# Patient Record
Sex: Male | Born: 1955 | Race: Black or African American | Hispanic: No | Smoking: Never smoker
Health system: Southern US, Community
[De-identification: ages and names within clinical notes are randomized; demographics above are authoritative.]

## PROBLEM LIST (undated history)

## (undated) DIAGNOSIS — N182 Chronic kidney disease, stage 2 (mild): Secondary | ICD-10-CM

## (undated) DIAGNOSIS — E039 Hypothyroidism, unspecified: Secondary | ICD-10-CM

## (undated) DIAGNOSIS — I509 Heart failure, unspecified: Secondary | ICD-10-CM

## (undated) DIAGNOSIS — E785 Hyperlipidemia, unspecified: Secondary | ICD-10-CM

## (undated) DIAGNOSIS — I6381 Other cerebral infarction due to occlusion or stenosis of small artery: Secondary | ICD-10-CM

## (undated) DIAGNOSIS — F32A Depression, unspecified: Secondary | ICD-10-CM

## (undated) DIAGNOSIS — I2510 Atherosclerotic heart disease of native coronary artery without angina pectoris: Secondary | ICD-10-CM

## (undated) DIAGNOSIS — E079 Disorder of thyroid, unspecified: Secondary | ICD-10-CM

## (undated) DIAGNOSIS — I5189 Other ill-defined heart diseases: Secondary | ICD-10-CM

## (undated) DIAGNOSIS — F1490 Cocaine use, unspecified, uncomplicated: Secondary | ICD-10-CM

## (undated) DIAGNOSIS — I10 Essential (primary) hypertension: Secondary | ICD-10-CM

## (undated) HISTORY — DX: Depression, unspecified: F32.A

## (undated) HISTORY — DX: Atherosclerotic heart disease of native coronary artery without angina pectoris: I25.10

## (undated) HISTORY — DX: Essential (primary) hypertension: I10

## (undated) HISTORY — DX: Hyperlipidemia, unspecified: E78.5

## (undated) HISTORY — DX: Heart failure, unspecified: I50.9

## (undated) HISTORY — PX: HAND SURGERY: SHX662

---

## 2006-05-10 ENCOUNTER — Inpatient Hospital Stay: Payer: Self-pay | Admitting: Specialist

## 2006-05-10 IMAGING — CR DG HAND COMPLETE 3+V*L*
1 series · 3 of 3 positions shown · non-contrast
Comparison: none

REASON FOR EXAM: SWELLING
COMMENTS:

[Series 1: view not recorded · 0.17mm/px · 3 of 3 slices shown]
[im 1/3]
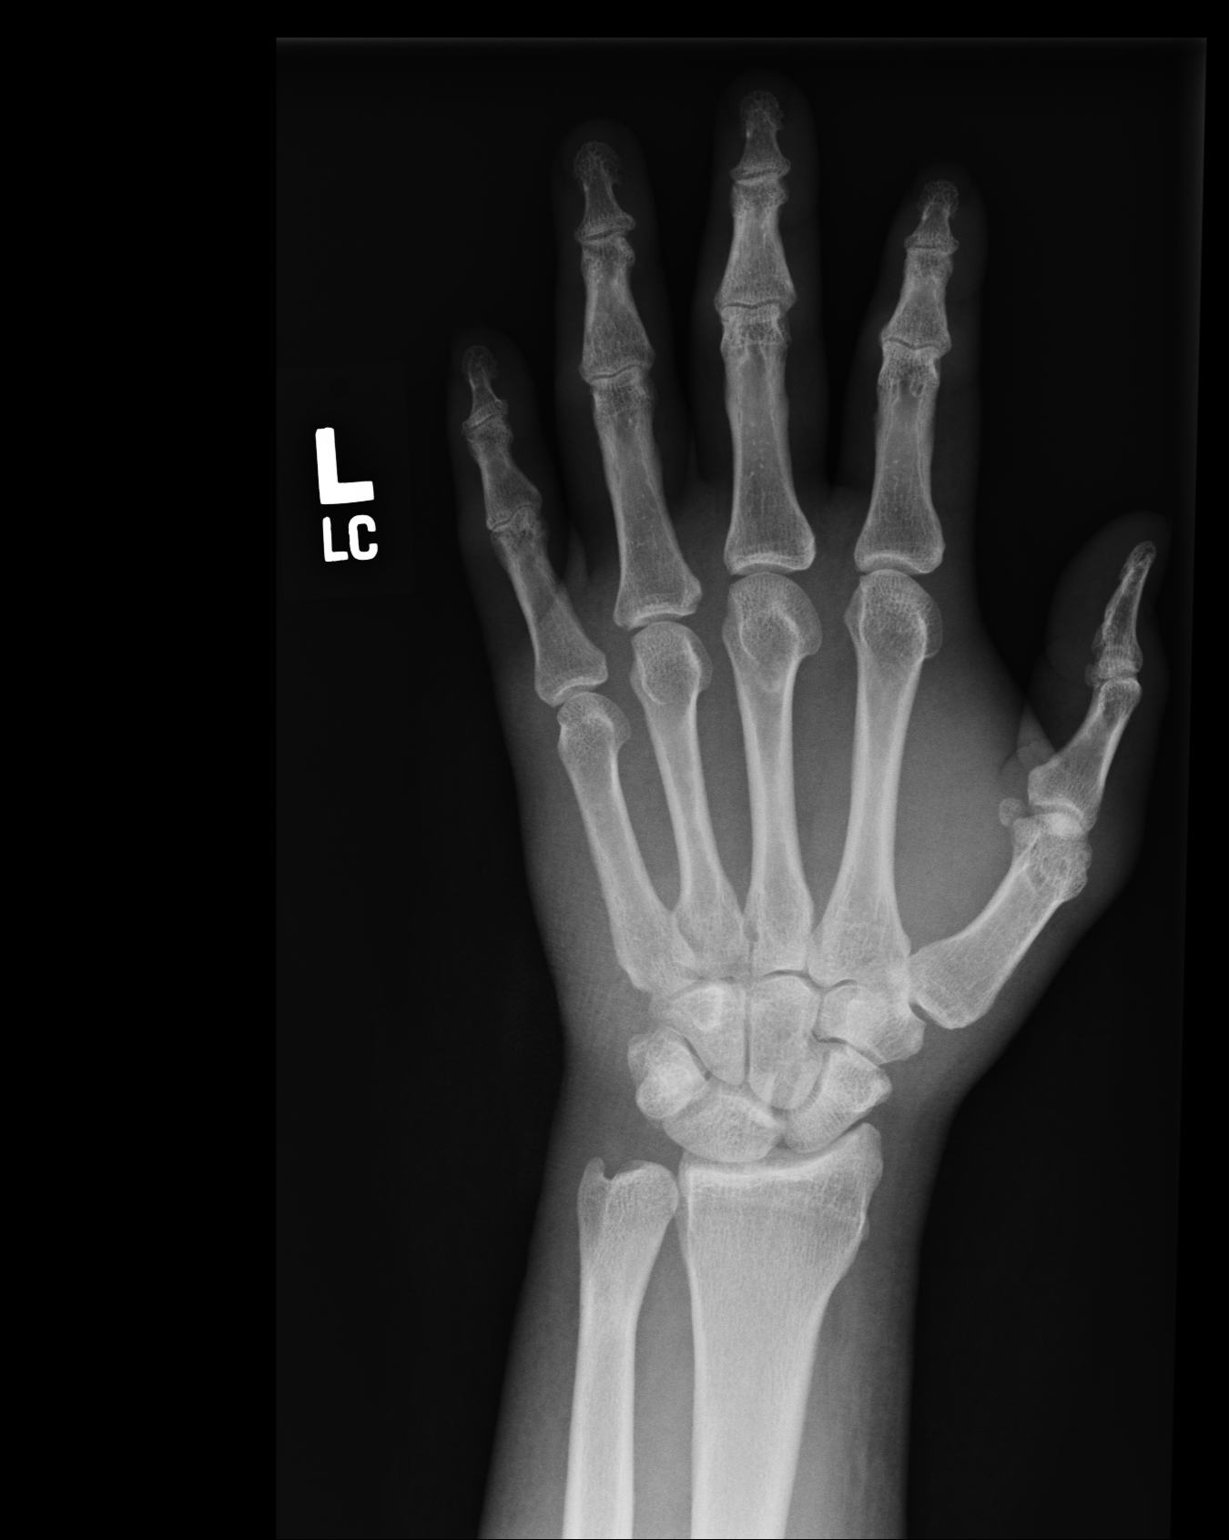
[im 2/3]
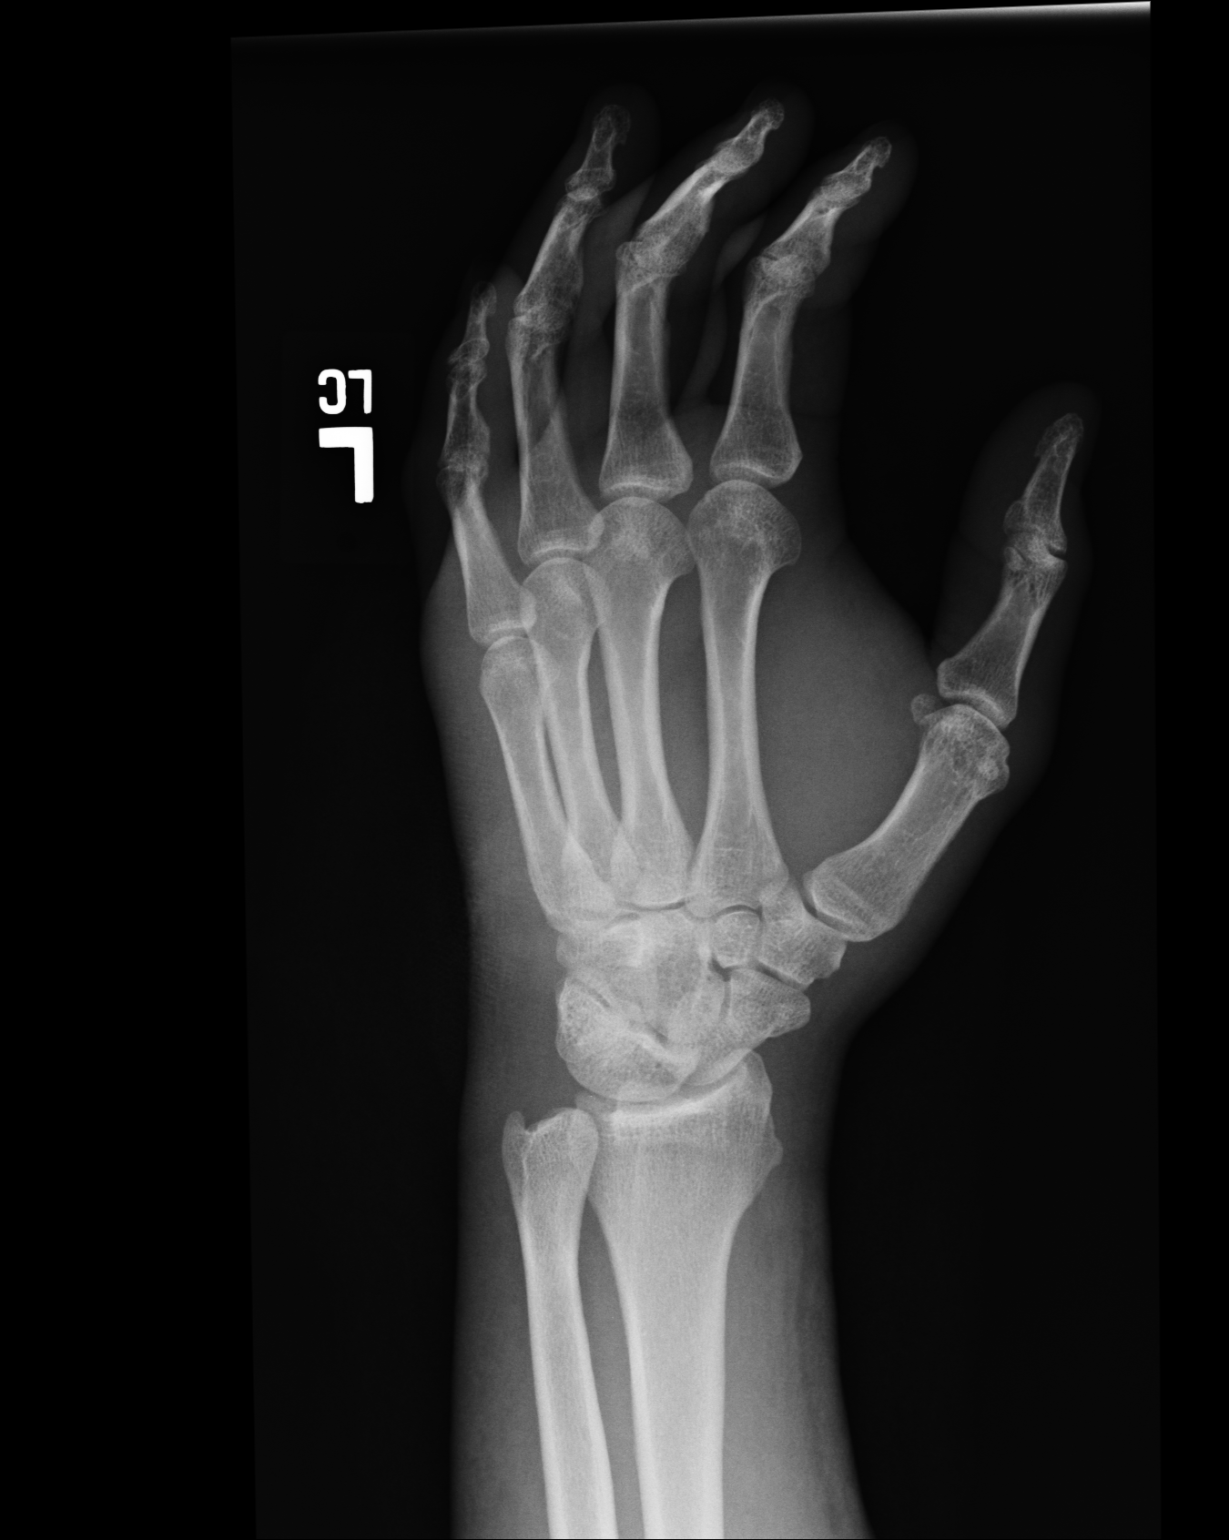
[im 3/3]
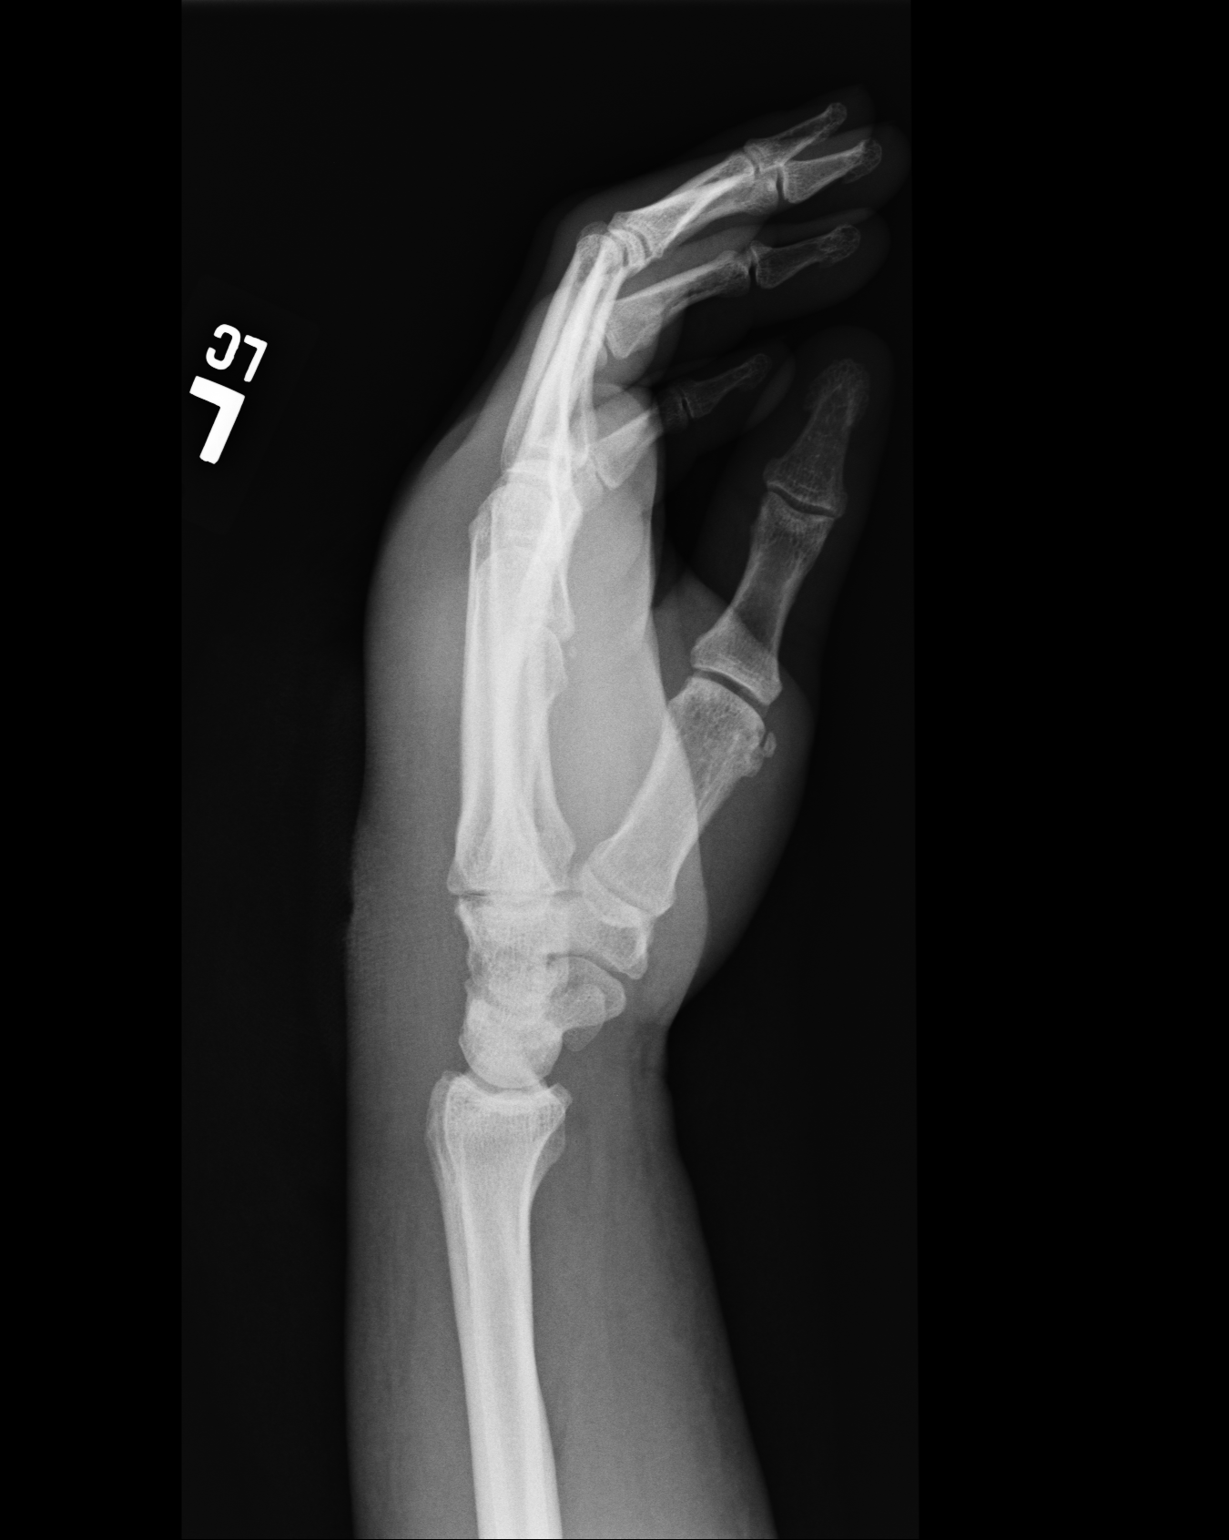

[3 of 3 positions shown; findings below may reference images not displayed]

PROCEDURE:     DXR - DXR HAND LT COMPLETE  W/OBLIQUES  - [DATE]  [DATE]

RESULT:       Three views of the LEFT hand show no fracture or dislocation.
No findings suspicious for osteomyelitis are identified.  No foreign body is
seen.  There is slight arthritic change at the DIP joints and at the IP
joint of the thumb.
IMPRESSION: 1.     No acute changes are identified.
2.     There is mild arthritic change at the IP joint of the thumb and at
the DIP joints of the second through the fifth fingers.

## 2006-05-11 ENCOUNTER — Other Ambulatory Visit: Payer: Self-pay

## 2006-05-11 IMAGING — CR DG CHEST 2V
1 series · 2 of 2 positions shown · non-contrast
Comparison: none

REASON FOR EXAM: hyponatremia
COMMENTS:

PROCEDURE:     DXR - DXR CHEST PA (OR AP) AND LATERAL  - [DATE]  [DATE]
RESULT:     The lung fields are clear. The heart, mediastinal and osseous
structures show no significant abnormalities.

[Series 1: view not recorded · 0.17mm/px · 2 of 2 slices shown]
[im 1/2]
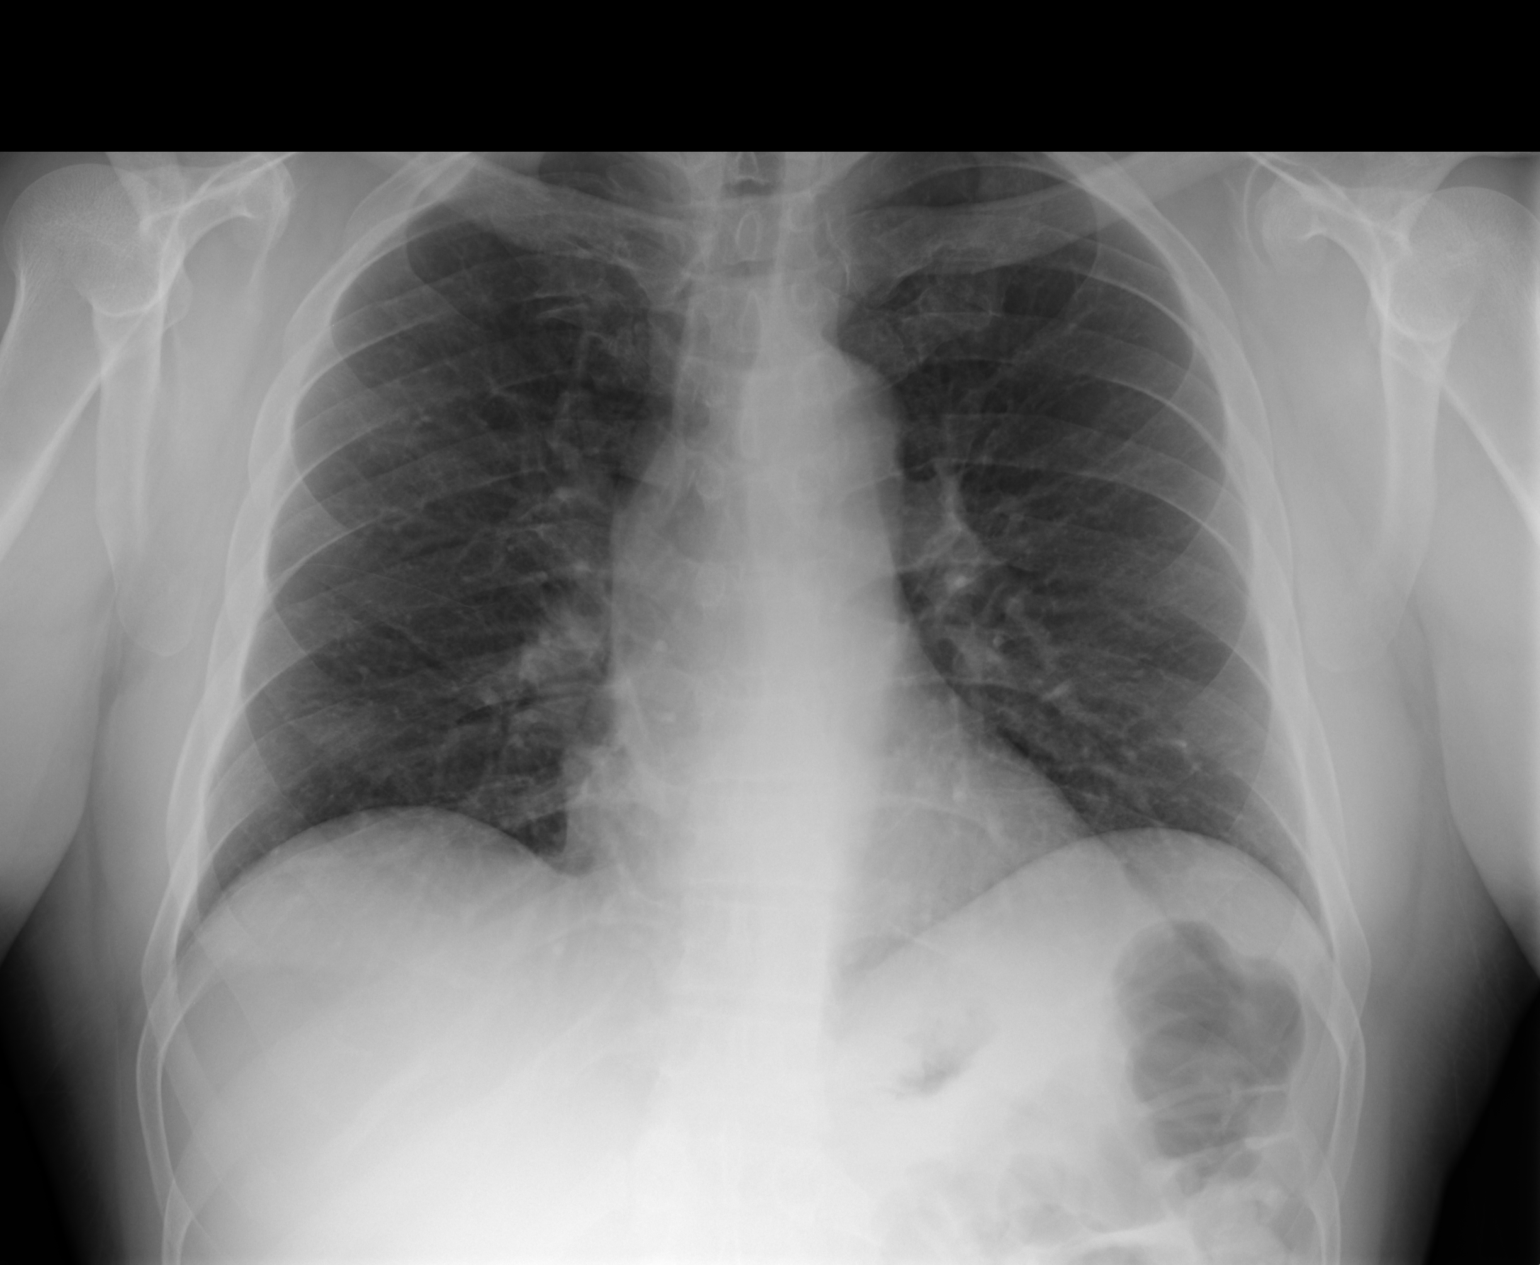
[im 2/2]
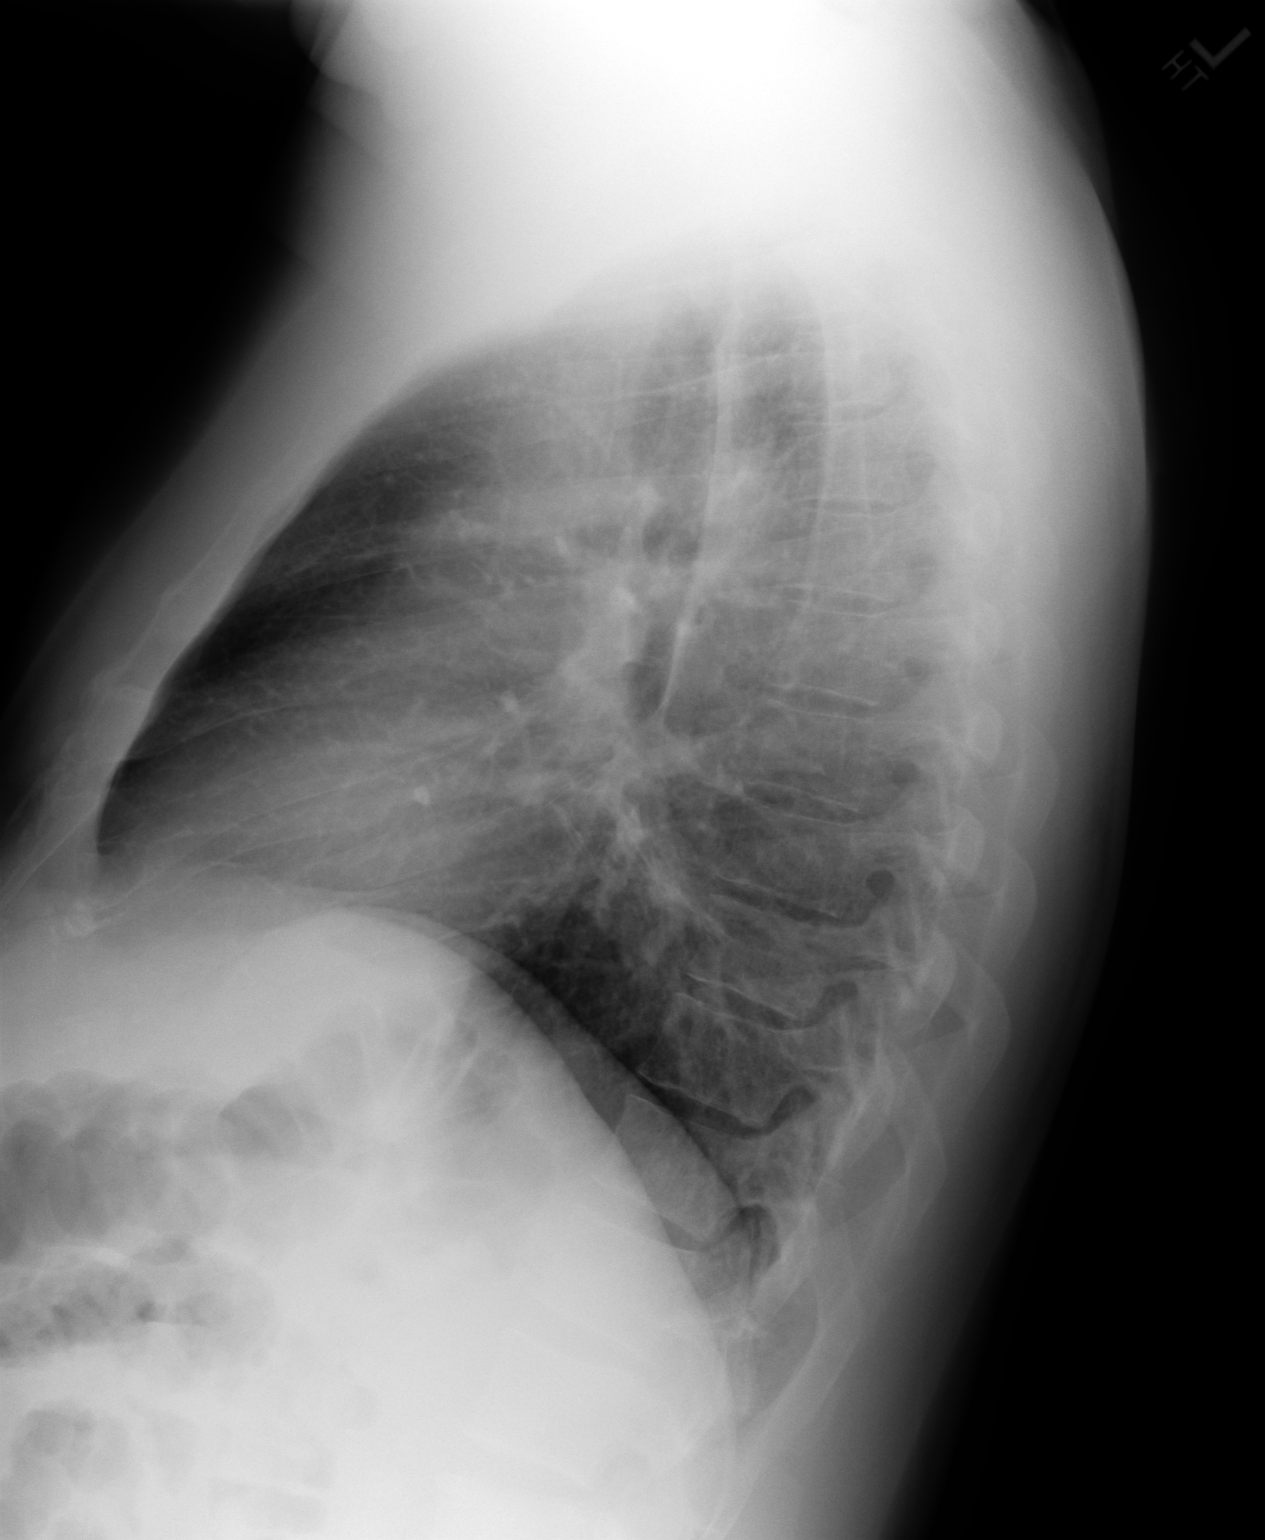

[2 of 2 positions shown; findings below may reference images not displayed]

IMPRESSION: 1)No acute changes are identified.

## 2008-06-04 ENCOUNTER — Emergency Department: Payer: Self-pay | Admitting: Emergency Medicine

## 2008-06-04 IMAGING — CR DG HAND 2V*L*
1 series · 2 of 2 positions shown · non-contrast
Comparison: none

REASON FOR EXAM: pain
COMMENTS:

PROCEDURE:     DXR - DXR HAND LT TWO VIEWS  - [DATE]  [DATE]
RESULT:     No fracture, dislocation or other acute bony abnormality is
identified.

[Series 1: view not recorded · 0.17mm/px · 2 of 2 slices shown]
[im 1/2]
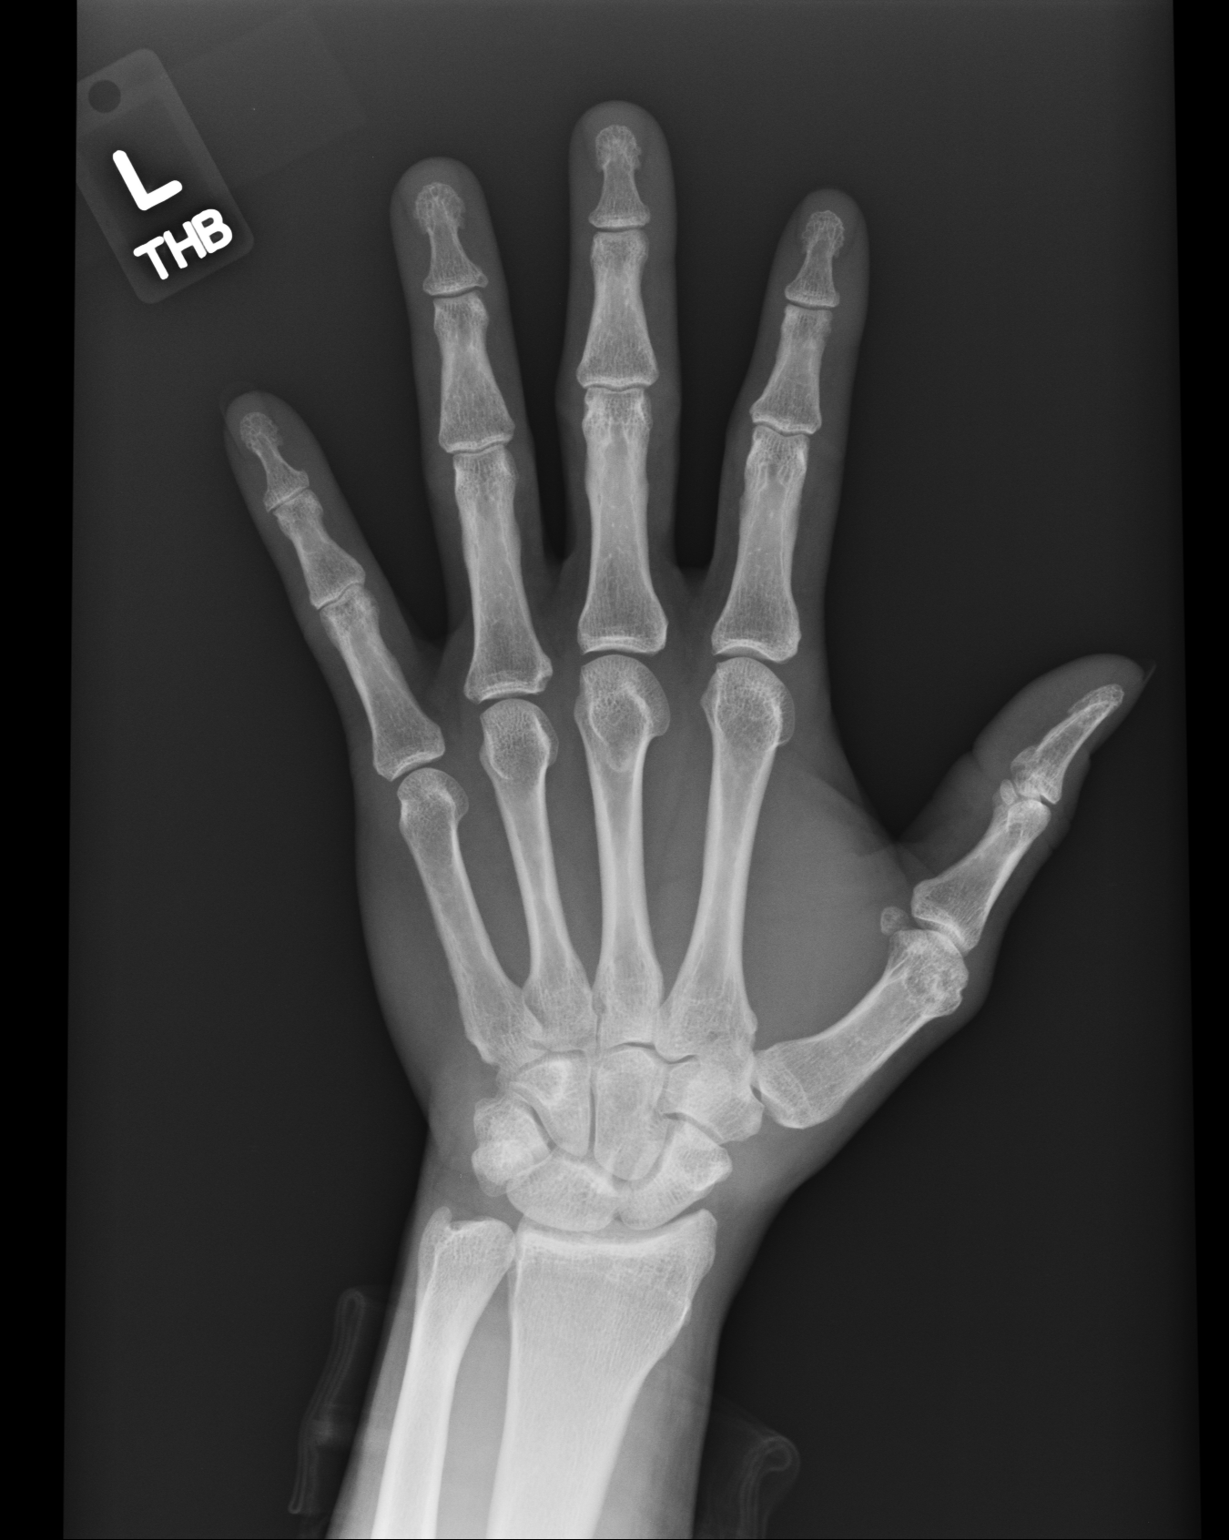
[im 2/2]
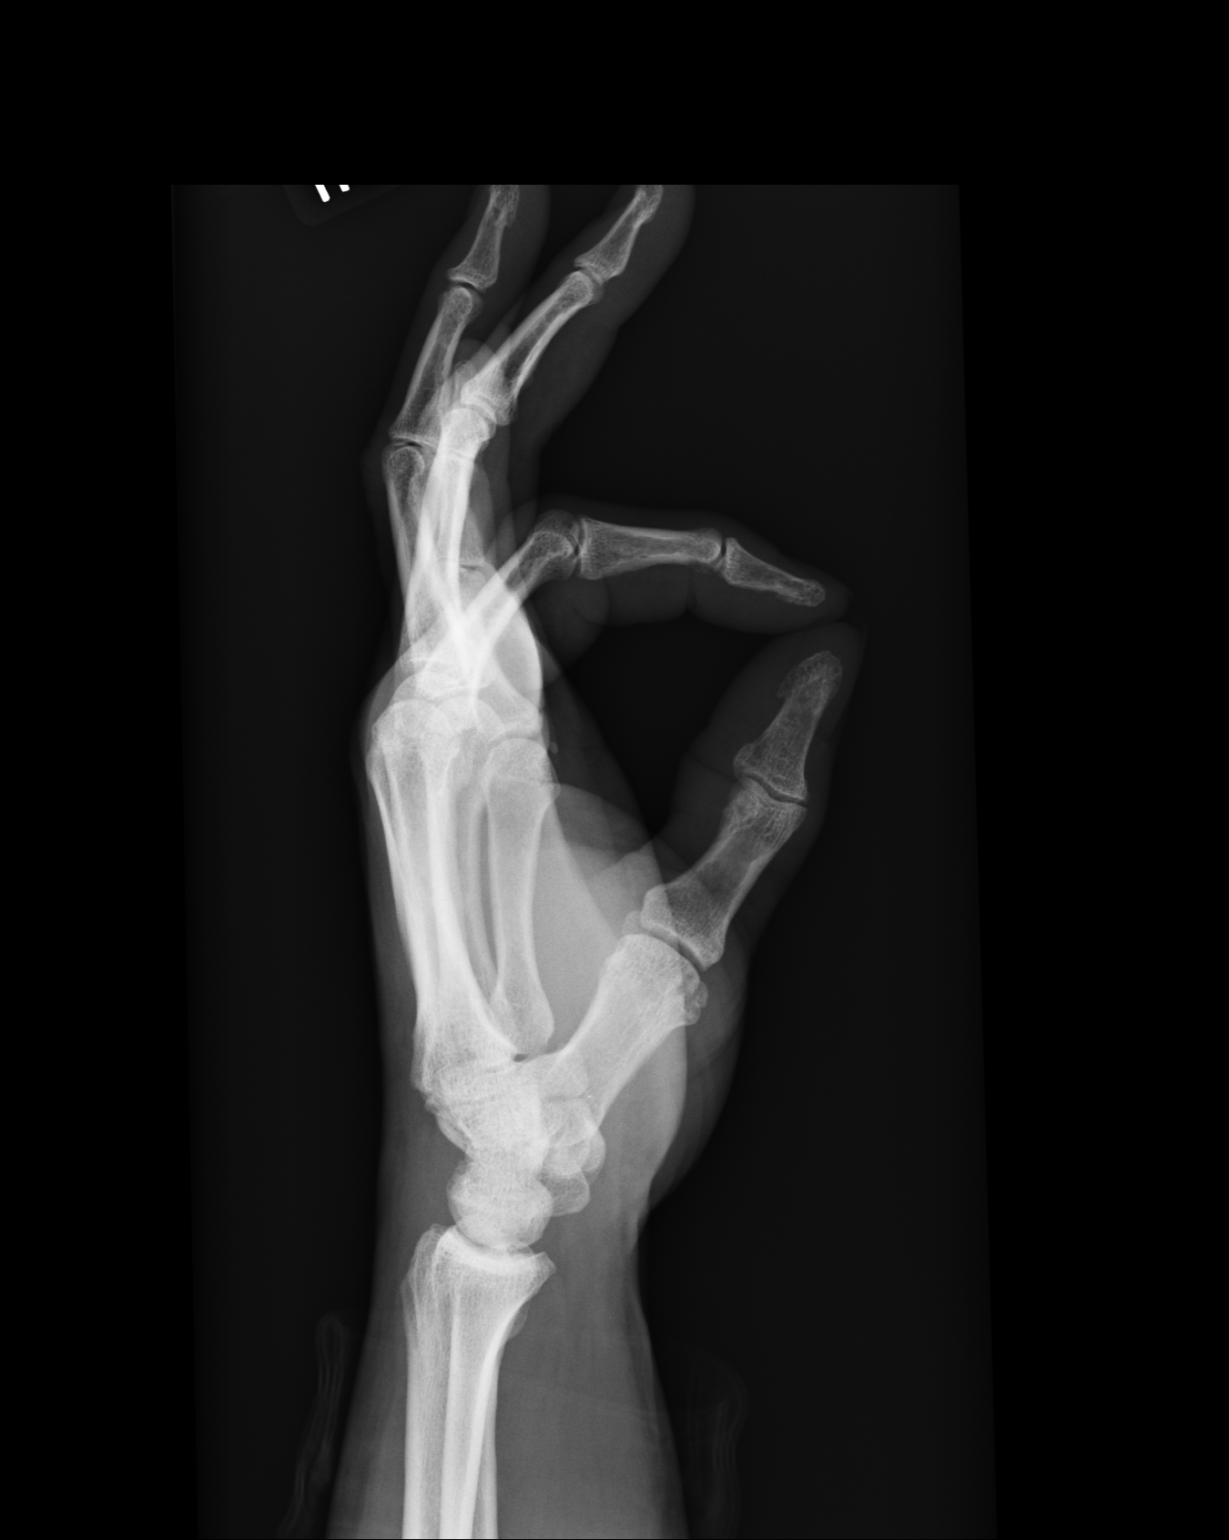

[2 of 2 positions shown; findings below may reference images not displayed]

IMPRESSION: No significant osseous abnormalities are noted.

## 2008-11-18 ENCOUNTER — Ambulatory Visit: Payer: Self-pay | Admitting: Cardiovascular Disease

## 2008-11-18 ENCOUNTER — Observation Stay (HOSPITAL_COMMUNITY): Admission: EM | Admit: 2008-11-18 | Discharge: 2008-11-18 | Payer: Self-pay | Admitting: Emergency Medicine

## 2008-11-18 ENCOUNTER — Encounter (INDEPENDENT_AMBULATORY_CARE_PROVIDER_SITE_OTHER): Payer: Self-pay | Admitting: Emergency Medicine

## 2008-11-18 IMAGING — CR DG CHEST 1V PORT
1 series · 1 of 1 positions shown · non-contrast
Comparison: None

CLINICAL DATA: Chest pain and left arm tingling

PORTABLE CHEST - 1 VIEW

[view not recorded]
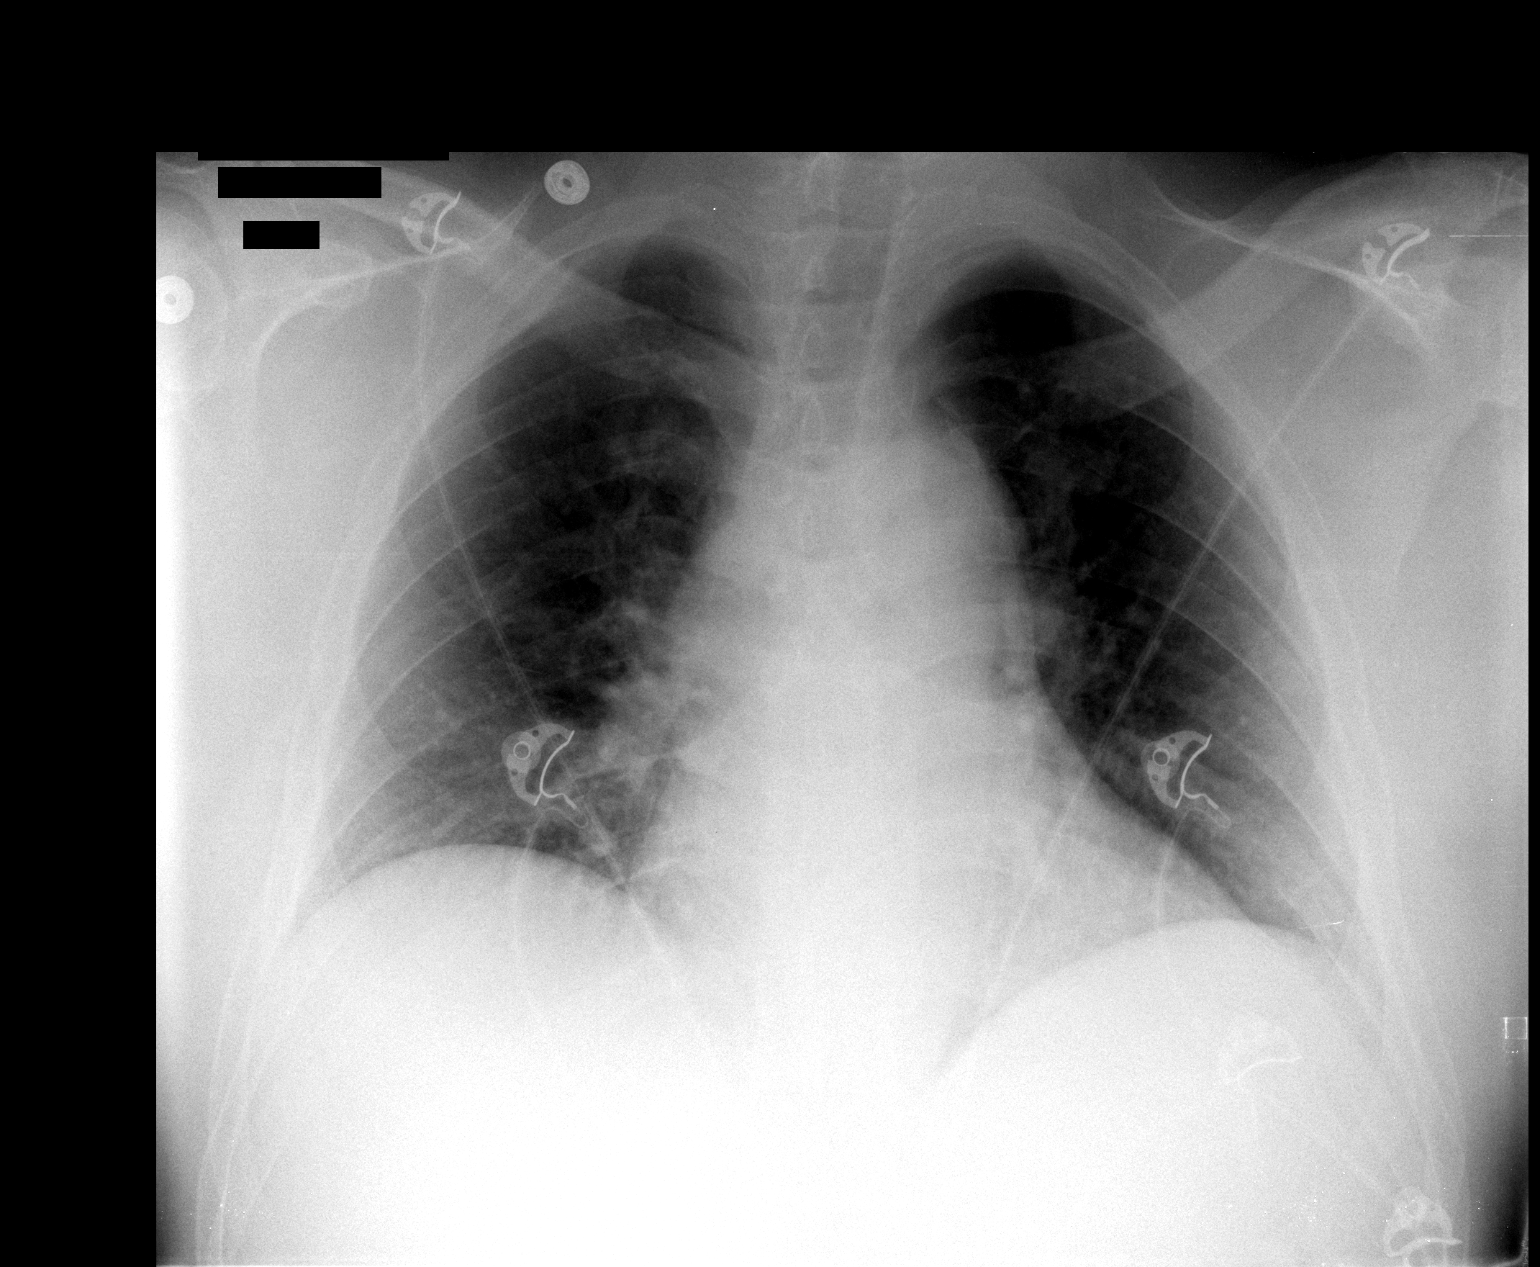

[1 of 1 positions shown; findings below may reference images not displayed]

FINDINGS: The cardiac silhouette, mediastinum, pulmonary
vasculature are within normal limits.  Both lungs are clear.
IMPRESSION: Unremarkable portable chest x-ray.

## 2009-02-14 ENCOUNTER — Emergency Department (HOSPITAL_COMMUNITY): Admission: EM | Admit: 2009-02-14 | Discharge: 2009-02-16 | Payer: Self-pay | Admitting: Emergency Medicine

## 2009-02-16 ENCOUNTER — Ambulatory Visit: Payer: Self-pay | Admitting: Psychiatry

## 2009-02-16 ENCOUNTER — Inpatient Hospital Stay (HOSPITAL_COMMUNITY): Admission: AD | Admit: 2009-02-16 | Discharge: 2009-02-20 | Payer: Self-pay | Admitting: Psychiatry

## 2009-02-19 ENCOUNTER — Encounter (INDEPENDENT_AMBULATORY_CARE_PROVIDER_SITE_OTHER): Payer: Self-pay | Admitting: Family Medicine

## 2009-03-02 ENCOUNTER — Emergency Department (HOSPITAL_COMMUNITY): Admission: EM | Admit: 2009-03-02 | Discharge: 2009-03-02 | Payer: Self-pay | Admitting: Emergency Medicine
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
## 2009-03-19 ENCOUNTER — Inpatient Hospital Stay (HOSPITAL_COMMUNITY): Admission: AD | Admit: 2009-03-19 | Discharge: 2009-03-25 | Payer: Self-pay | Admitting: Psychiatry

## 2009-03-19 ENCOUNTER — Ambulatory Visit: Payer: Self-pay | Admitting: Psychiatry

## 2009-03-19 ENCOUNTER — Emergency Department (HOSPITAL_COMMUNITY): Admission: EM | Admit: 2009-03-19 | Discharge: 2009-03-19 | Payer: Self-pay | Admitting: Emergency Medicine
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
## 2010-04-20 NOTE — Letter (Signed)
[facility]   Imported By: Arta Bruce 04/15/2009 15:07:06  _____________________________________________________________________  External Attachment:    Type:   Image     Comment:   External Document

## 2010-06-06 LAB — GLUCOSE, CAPILLARY
Glucose-Capillary: 176 mg/dL — ABNORMAL HIGH (ref 70–99)
Glucose-Capillary: 187 mg/dL — ABNORMAL HIGH (ref 70–99)
Glucose-Capillary: 191 mg/dL — ABNORMAL HIGH (ref 70–99)
Glucose-Capillary: 196 mg/dL — ABNORMAL HIGH (ref 70–99)
Glucose-Capillary: 200 mg/dL — ABNORMAL HIGH (ref 70–99)
Glucose-Capillary: 204 mg/dL — ABNORMAL HIGH (ref 70–99)
Glucose-Capillary: 240 mg/dL — ABNORMAL HIGH (ref 70–99)
Glucose-Capillary: 275 mg/dL — ABNORMAL HIGH (ref 70–99)
Glucose-Capillary: 297 mg/dL — ABNORMAL HIGH (ref 70–99)

## 2010-06-08 ENCOUNTER — Observation Stay: Payer: Self-pay | Admitting: Internal Medicine

## 2010-06-08 DIAGNOSIS — R079 Chest pain, unspecified: Secondary | ICD-10-CM

## 2010-06-08 IMAGING — NM NM MYOCARD GATED
1 of 8 series · 3 of 34 positions shown, 4 images · non-contrast
Comparison: none

REASON FOR EXAM: unstable angina
COMMENTS:

[Series 2: ac rest cardiac 5.0 b08s · axial · 0.98mm/px · z∈[+1559,+1714]mm · 3 of 32 slices shown, 4 images]
[im 1/32  soft-tissue]
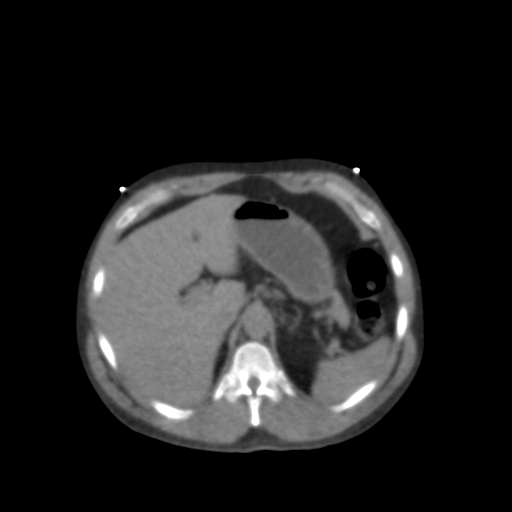
[im 1/32  bone]
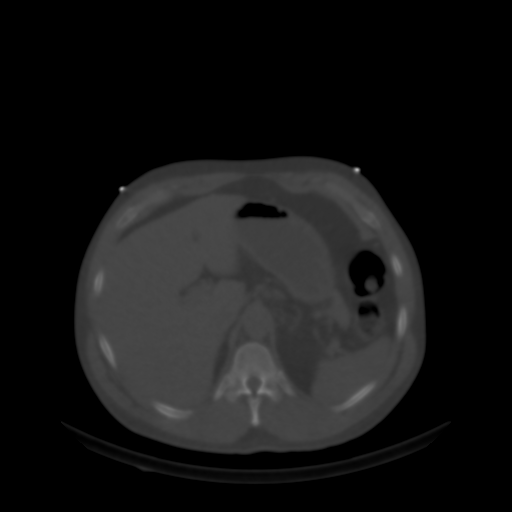
[im 16/32  soft-tissue]
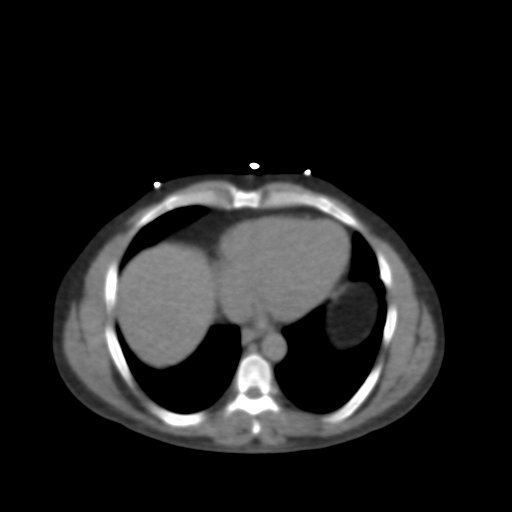
[im 32/32  soft-tissue]
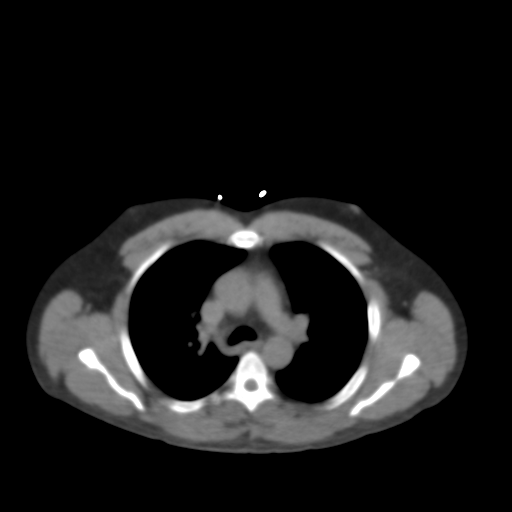

[3 of 34 positions shown; findings below may reference images not displayed]

PROCEDURE:     NM  - NM GATED MYOCARDIAL ST SCAN [DATE]OF[DATE]  - [DATE] [DATE] [DATE]  [DATE]

RESULT:

ORDERING PHYSICIAN:  Dr. DREY

INTERPRETING PHYSICIAN: Dr. DREY

ID: Mr. DREY is a pleasant gentleman who presented through the Emergency
Room with chest pain.  He also had shortness of breath and history of
syncope. No known coronary artery disease.

Resting EKG shows normal sinus rhythm with rate 61 beats per minute with no
significant ST or T-wave changes. Resting blood pressure was 161/104. He
exercised on a regular Bruce protocol for a total time of 9 minutes and 55
seconds, maximum heart rate 155 beats per minute which was 94% of his
maximum predicted heart rate of 164 beats per minute, maximum blood pressure
221/92. He achieved 11.7 METS. No significant EKG changes were noted at peak
exercise or in recovery.

Technetium 99m Sestamibi dosing at rest was 13.72 mCi. Stress dose was
mCi.

Perfusion imaging shows normal perfusion throughout the myocardium both at
stress and rest. Ejection fraction estimated at 58%. End-diastolic volume 88
mls, end-systolic volume 37 mls. No focal wall motion abnormalities noted.
IMPRESSION: 1.Exercise myocardial perfusion imaging study with no significant ischemia
noted. Normal perfusion throughout the myocardium at stress and rest. Normal
wall motion. No significant EKG changes noted. Ejection fraction 58%.
Overall, this is a low-risk scan.

## 2010-06-08 IMAGING — CR DG CHEST 2V
1 series · 2 of 2 positions shown · non-contrast
Comparison: none

REASON FOR EXAM: cp
COMMENTS:

PROCEDURE:     DXR - DXR CHEST PA (OR AP) AND LATERAL  - [DATE]  [DATE]
RESULT:     The lung fields are clear. No pneumonia, pneumothorax or pleural
effusion is seen. The heart size is normal. The mediastinal and osseous
structures show no significant abnormalities.

[Series 1: view not recorded · 0.17mm/px · 2 of 2 slices shown]
[im 1/2]
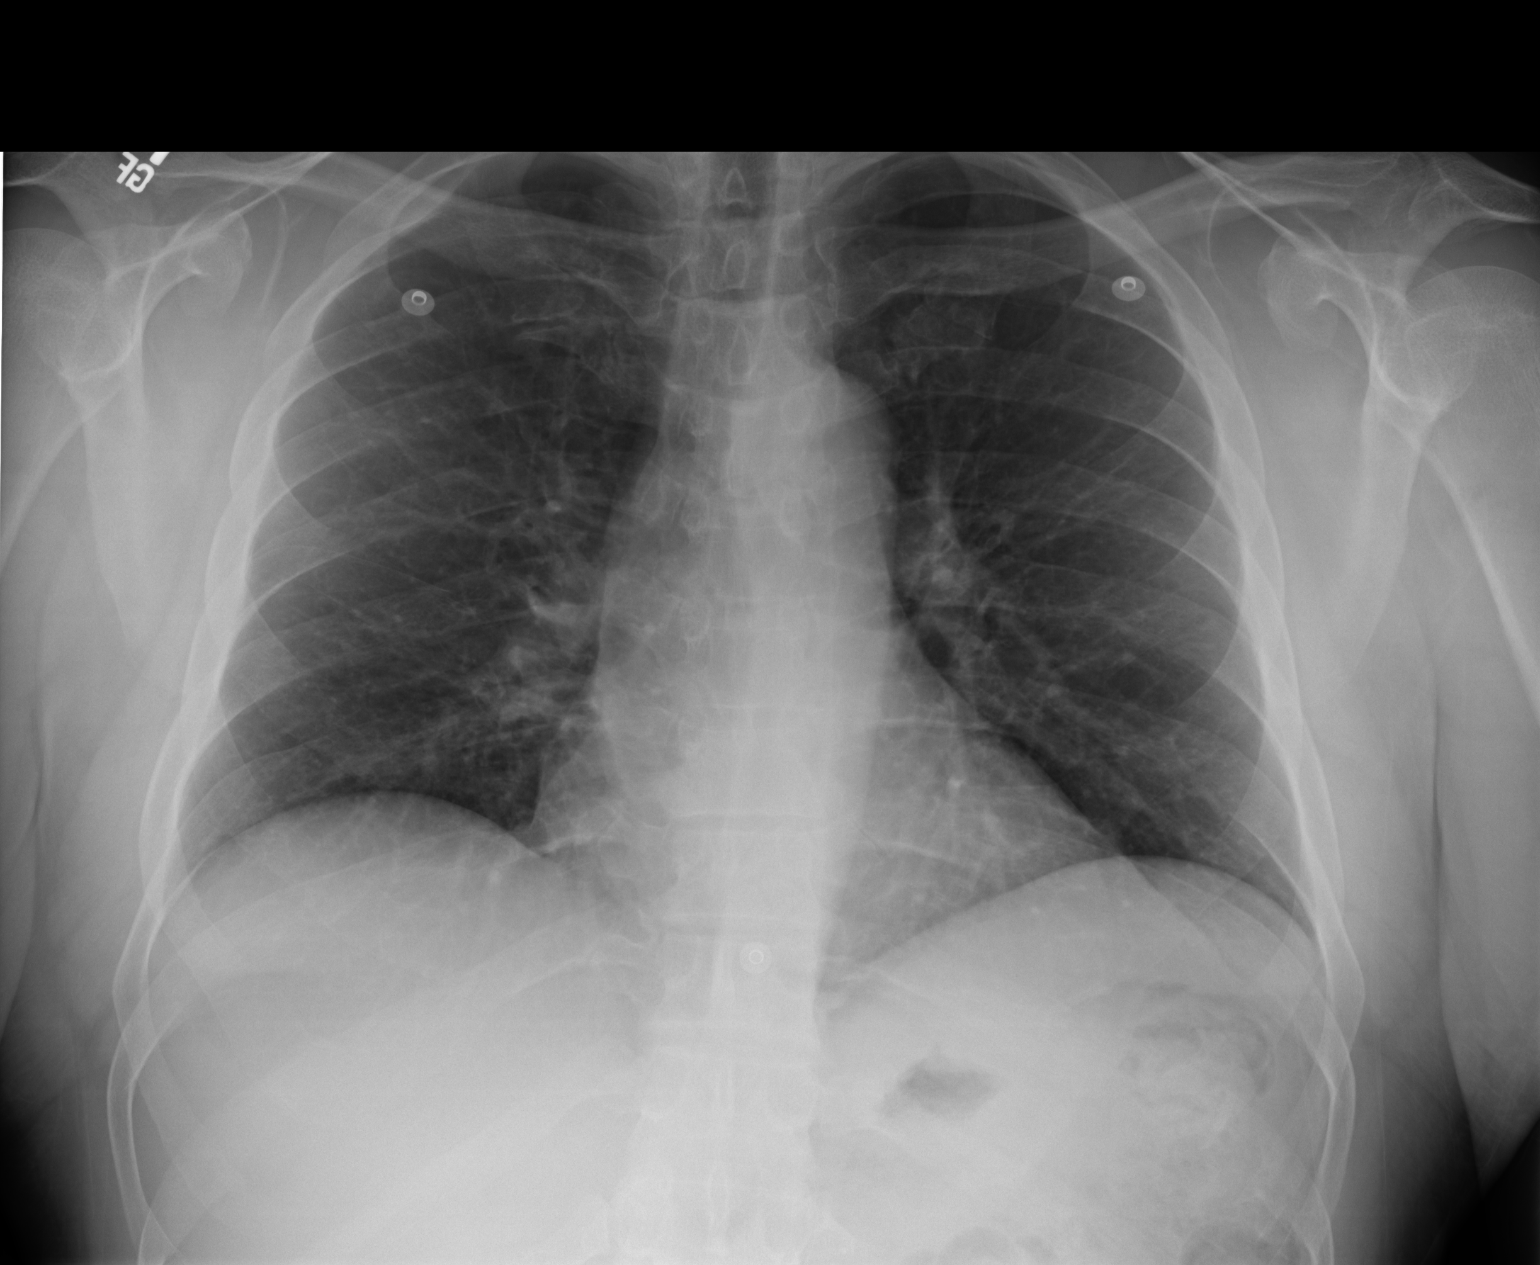
[im 2/2]
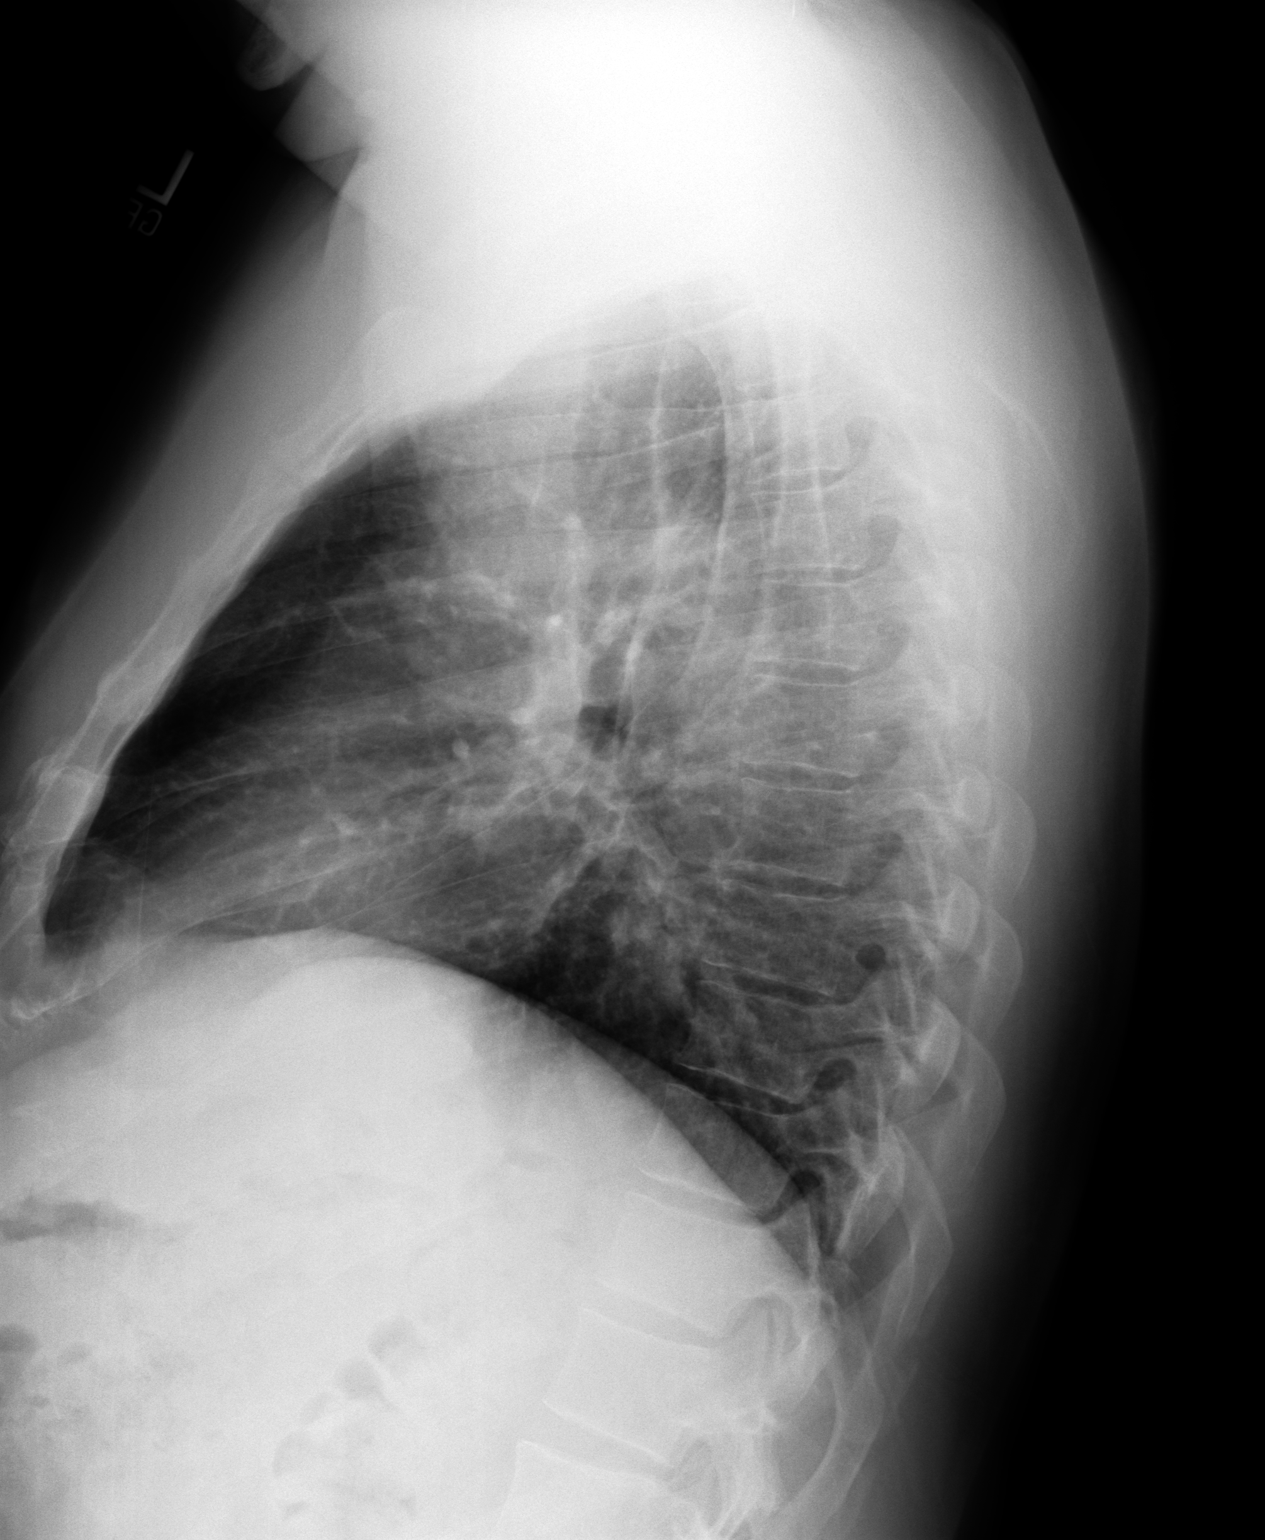

[2 of 2 positions shown; findings below may reference images not displayed]

IMPRESSION: No acute changes are identified.

## 2010-06-21 LAB — GLUCOSE, CAPILLARY
Glucose-Capillary: 143 mg/dL — ABNORMAL HIGH (ref 70–99)
Glucose-Capillary: 459 mg/dL — ABNORMAL HIGH (ref 70–99)
Glucose-Capillary: 511 mg/dL (ref 70–99)

## 2010-06-21 LAB — RAPID URINE DRUG SCREEN, HOSP PERFORMED
Amphetamines: NOT DETECTED
Barbiturates: NOT DETECTED
Benzodiazepines: NOT DETECTED
Opiates: NOT DETECTED
Tetrahydrocannabinol: POSITIVE — AB

## 2010-06-21 LAB — DIFFERENTIAL
Eosinophils Relative: 0 % (ref 0–5)
Lymphocytes Relative: 29 % (ref 12–46)
Lymphs Abs: 2.7 10*3/uL (ref 0.7–4.0)
Monocytes Absolute: 0.7 10*3/uL (ref 0.1–1.0)
Monocytes Relative: 7 % (ref 3–12)

## 2010-06-21 LAB — COMPREHENSIVE METABOLIC PANEL
ALT: 23 U/L (ref 0–53)
AST: 19 U/L (ref 0–37)
Albumin: 4.1 g/dL (ref 3.5–5.2)
Calcium: 9.4 mg/dL (ref 8.4–10.5)
Creatinine, Ser: 1.06 mg/dL (ref 0.4–1.5)
GFR calc Af Amer: >60 mL/min (ref >60)
Sodium: 133 mEq/L — ABNORMAL LOW (ref 135–145)

## 2010-06-21 LAB — CBC
MCHC: 33.9 g/dL (ref 30.0–36.0)
MCV: 89.4 fL (ref 78.0–100.0)
Platelets: 315 10*3/uL (ref 150–400)
WBC: 9.5 10*3/uL (ref 4.0–10.5)

## 2010-06-22 LAB — GLUCOSE, CAPILLARY
Glucose-Capillary: 256 mg/dL — ABNORMAL HIGH (ref 70–99)
Glucose-Capillary: 285 mg/dL — ABNORMAL HIGH (ref 70–99)
Glucose-Capillary: 288 mg/dL — ABNORMAL HIGH (ref 70–99)
Glucose-Capillary: 327 mg/dL — ABNORMAL HIGH (ref 70–99)

## 2010-06-23 LAB — GLUCOSE, CAPILLARY
Glucose-Capillary: 227 mg/dL — ABNORMAL HIGH (ref 70–99)
Glucose-Capillary: 228 mg/dL — ABNORMAL HIGH (ref 70–99)
Glucose-Capillary: 267 mg/dL — ABNORMAL HIGH (ref 70–99)
Glucose-Capillary: 285 mg/dL — ABNORMAL HIGH (ref 70–99)
Glucose-Capillary: 297 mg/dL — ABNORMAL HIGH (ref 70–99)
Glucose-Capillary: 363 mg/dL — ABNORMAL HIGH (ref 70–99)

## 2010-06-23 LAB — RAPID URINE DRUG SCREEN, HOSP PERFORMED
Amphetamines: NOT DETECTED
Barbiturates: NOT DETECTED

## 2010-06-23 LAB — CBC
HCT: 45 % (ref 39.0–52.0)
MCV: 88.2 fL (ref 78.0–100.0)
Platelets: 279 10*3/uL (ref 150–400)
RDW: 12.6 % (ref 11.5–15.5)

## 2010-06-23 LAB — POCT I-STAT, CHEM 8
BUN: 12 mg/dL (ref 6–23)
Creatinine, Ser: 1.1 mg/dL (ref 0.4–1.5)
Hemoglobin: 15.6 g/dL (ref 13.0–17.0)
Potassium: 4.5 mEq/L (ref 3.5–5.1)
Sodium: 134 mEq/L — ABNORMAL LOW (ref 135–145)

## 2010-06-23 LAB — BASIC METABOLIC PANEL
BUN: 10 mg/dL (ref 6–23)
Chloride: 102 mEq/L (ref 96–112)
Glucose, Bld: 121 mg/dL — ABNORMAL HIGH (ref 70–99)
Potassium: 3.6 mEq/L (ref 3.5–5.1)

## 2010-06-23 LAB — DIFFERENTIAL
Basophils Absolute: 0 10*3/uL (ref 0.0–0.1)
Eosinophils Absolute: 0.1 10*3/uL (ref 0.0–0.7)
Eosinophils Relative: 1 % (ref 0–5)

## 2010-06-23 LAB — HEMOGLOBIN A1C: Mean Plasma Glucose: 315 mg/dL

## 2010-06-23 LAB — HEPATIC FUNCTION PANEL
AST: 20 U/L (ref 0–37)
Albumin: 3.3 g/dL — ABNORMAL LOW (ref 3.5–5.2)
Total Bilirubin: 0.6 mg/dL (ref 0.3–1.2)
Total Protein: 7 g/dL (ref 6.0–8.3)

## 2010-06-26 LAB — CBC
HCT: 47.7 % (ref 39.0–52.0)
MCHC: 34.4 g/dL (ref 30.0–36.0)
MCV: 89.1 fL (ref 78.0–100.0)
Platelets: 242 10*3/uL (ref 150–400)
RDW: 13.2 % (ref 11.5–15.5)

## 2010-06-26 LAB — POCT CARDIAC MARKERS
CKMB, poc: 4.4 ng/mL (ref 1.0–8.0)
CKMB, poc: <1 ng/mL — ABNORMAL LOW (ref 1.0–8.0)
Myoglobin, poc: 134 ng/mL (ref 12–200)
Myoglobin, poc: 80.8 ng/mL (ref 12–200)
Troponin i, poc: <0.05 ng/mL (ref 0.00–0.09)

## 2010-06-26 LAB — DIFFERENTIAL
Basophils Absolute: 0 10*3/uL (ref 0.0–0.1)
Basophils Relative: 0 % (ref 0–1)
Eosinophils Absolute: 0.2 10*3/uL (ref 0.0–0.7)
Eosinophils Relative: 2 % (ref 0–5)
Lymphs Abs: 2.3 10*3/uL (ref 0.7–4.0)

## 2010-06-26 LAB — BASIC METABOLIC PANEL
BUN: 15 mg/dL (ref 6–23)
CO2: 23 mEq/L (ref 19–32)
Chloride: 103 mEq/L (ref 96–112)
Creatinine, Ser: 1.14 mg/dL (ref 0.4–1.5)
Glucose, Bld: 327 mg/dL — ABNORMAL HIGH (ref 70–99)
Potassium: 3.9 mEq/L (ref 3.5–5.1)

## 2010-06-26 LAB — PROTIME-INR: Prothrombin Time: 12.2 seconds (ref 11.6–15.2)

## 2010-08-04 ENCOUNTER — Inpatient Hospital Stay: Payer: Self-pay | Admitting: Internal Medicine

## 2010-08-04 IMAGING — NM NM LUNG SCAN
2 series · 16 of 16 positions shown · non-contrast
Comparison: none

REASON FOR EXAM: shortness of breath, hemoptysis (will likely need
cardiac cath tomorrow)
COMMENTS:

[Series 1000: lung perfusion · 1.95mm/px · 4 acquisitions, 8 frames shown]
[im 1/4]
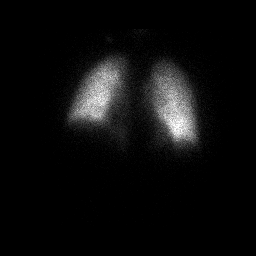
[im 1/4]
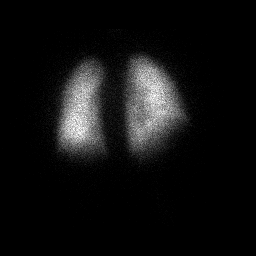
[im 2/4]
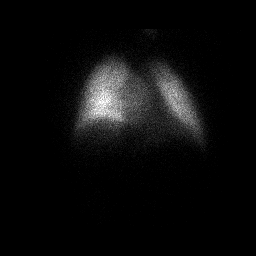
[im 2/4]
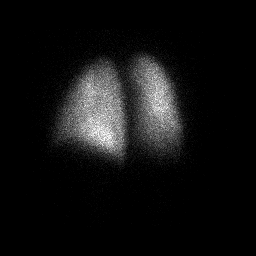
[im 3/4]
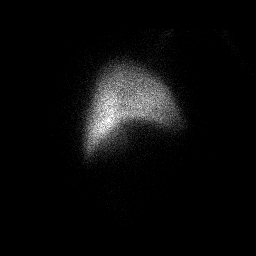
[im 3/4]
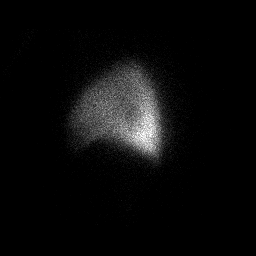
[im 4/4]
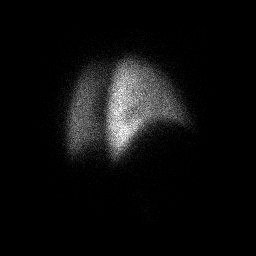
[im 4/4]
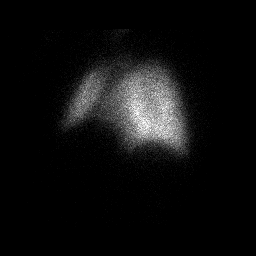

[Series 1000: lung ventilation · 3.90mm/px · 4 acquisitions, 8 frames shown]
[im 1/4]
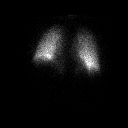
[im 1/4]
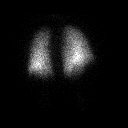
[im 2/4]
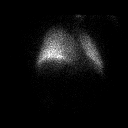
[im 2/4]
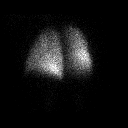
[im 3/4]
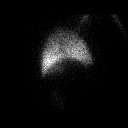
[im 3/4]
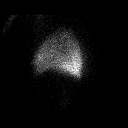
[im 4/4]
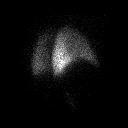
[im 4/4]
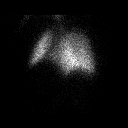

[16 of 16 positions shown; findings below may reference images not displayed]

PROCEDURE:     NM  - NM VQ LUNG SCAN  - [DATE] [DATE] [DATE]  [DATE]

RESULT:     Comparison is made to a current chest x-ray which shows grossly
normal aeration. 41.74 mCi of technetium 99 M DTPA were placed in the
nebulizer for ventilation images. 3.94 mCi of technetium 99 M MAA were
utilized for the perfusion images. Anterior, posterior, lateral and both
oblique projections were obtained. Ventilation and perfusion appearance is
normal.
IMPRESSION: Normal appearing ventilation/perfusion lung scan. No
findings to suggest pulmonary embolism.

## 2010-08-04 IMAGING — CR DG CHEST 2V
1 series · 2 of 2 positions shown · non-contrast
Comparison: none

REASON FOR EXAM: chest pain
COMMENTS:

[Series 1: view not recorded · 0.17mm/px · 2 of 2 slices shown]
[im 1/2]
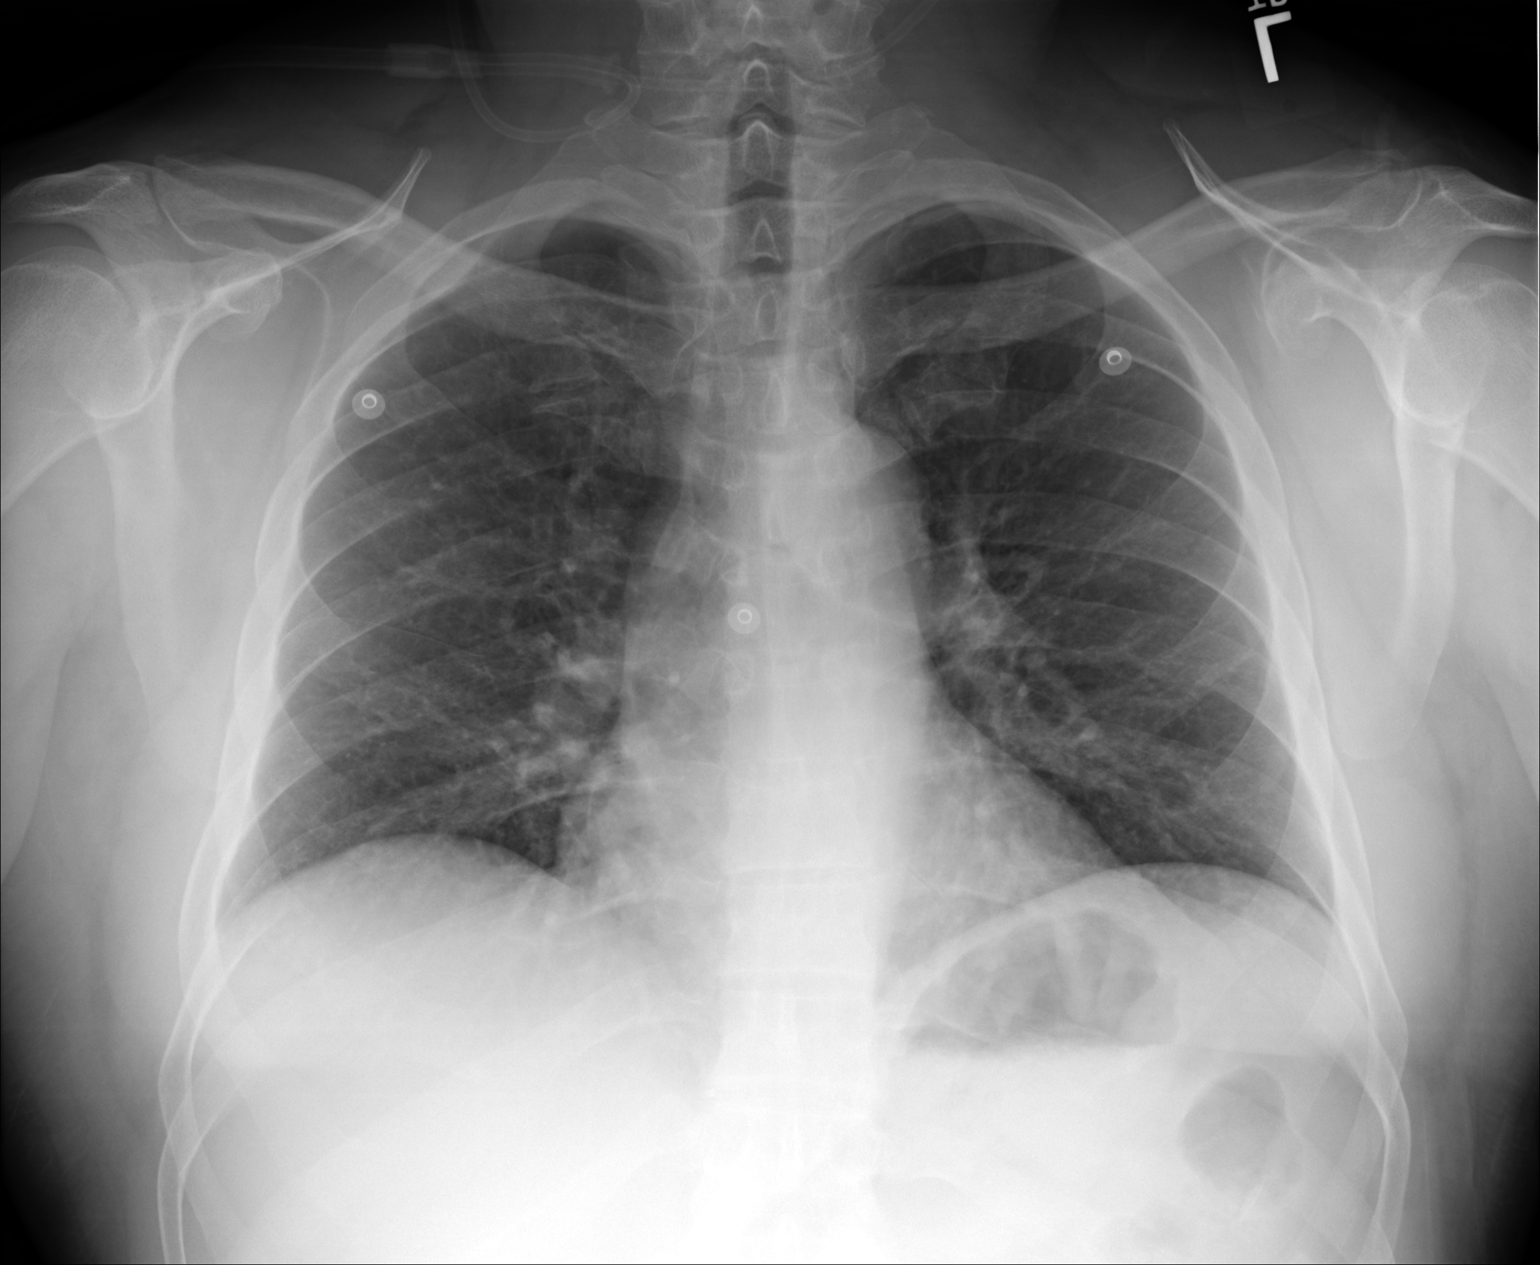
[im 2/2]
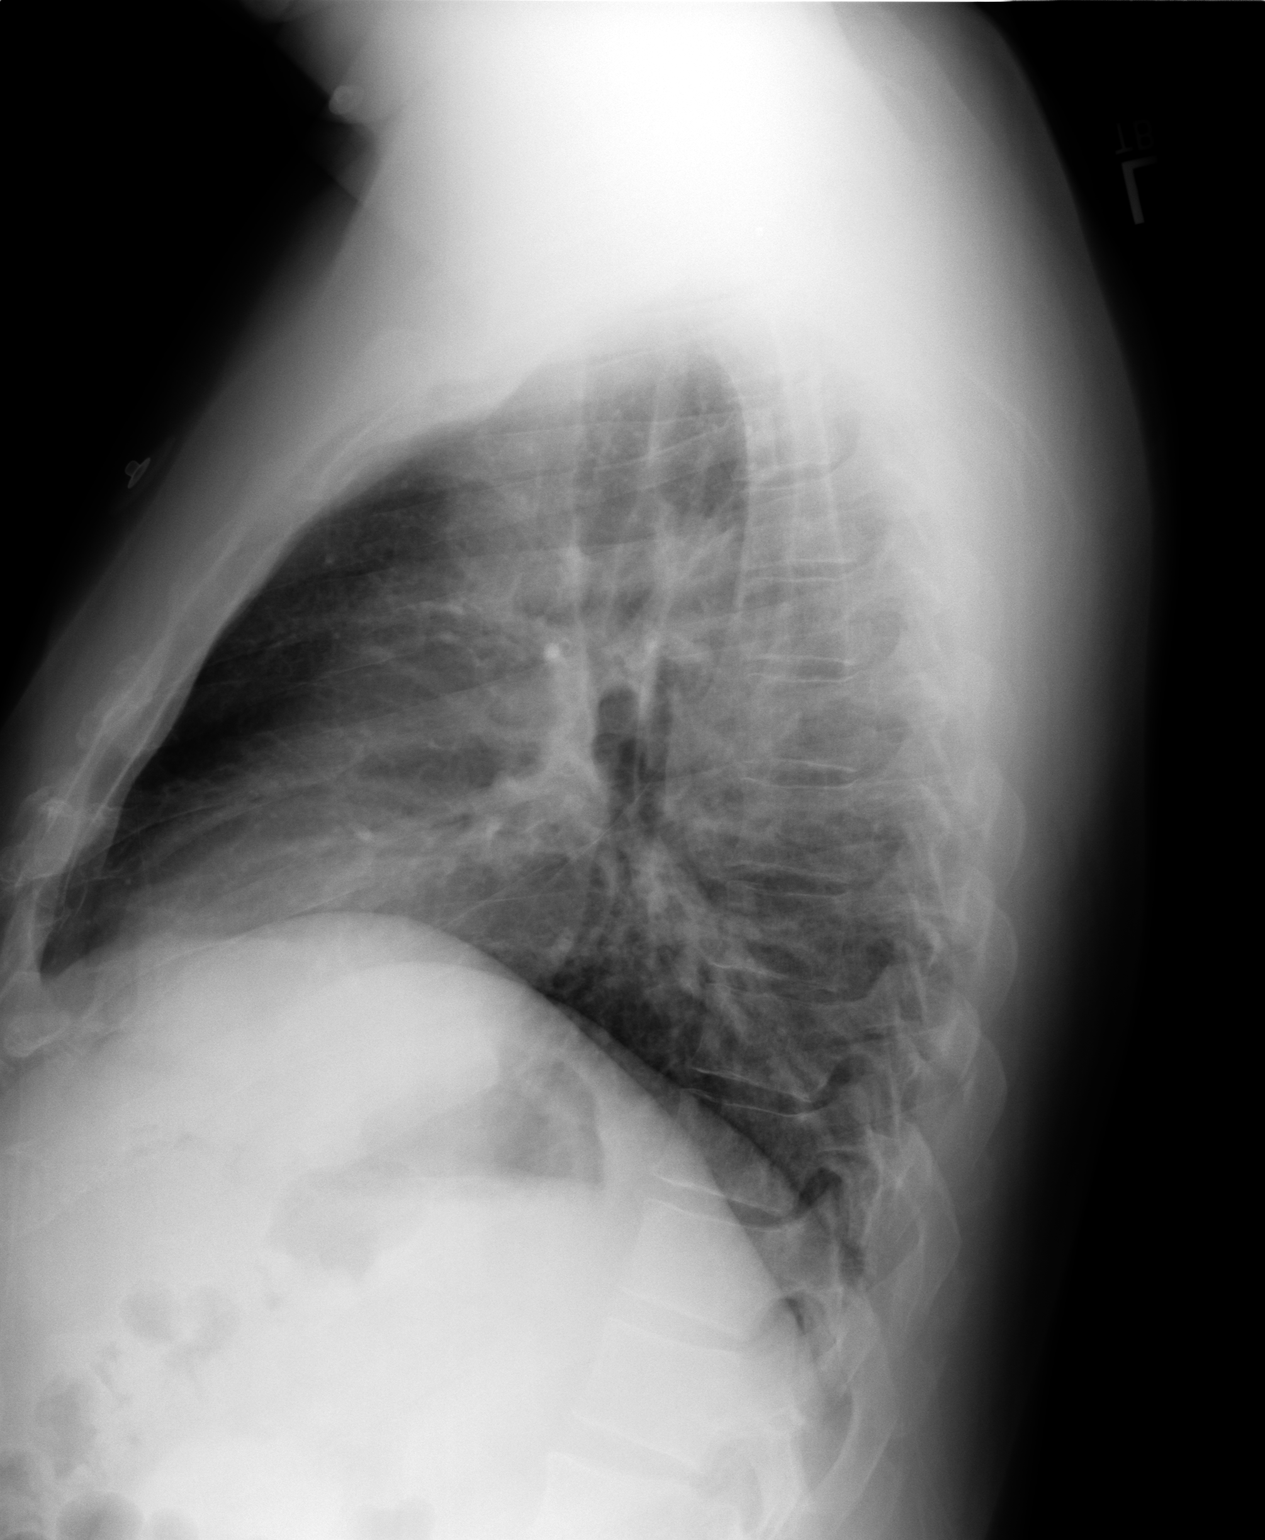

[2 of 2 positions shown; findings below may reference images not displayed]

PROCEDURE:     DXR - DXR CHEST PA (OR AP) AND LATERAL  - [DATE]  [DATE]

RESULT:     Comparison is made to the study of [DATE].

The lungs are clear. The heart and pulmonary vessels are normal. The bony
and mediastinal structures are unremarkable. There is no effusion. There is
no pneumothorax or evidence of congestive failure.
IMPRESSION: No acute cardiopulmonary disease.

## 2010-08-05 ENCOUNTER — Encounter: Payer: Self-pay | Admitting: Cardiovascular Disease

## 2010-08-05 DIAGNOSIS — I214 Non-ST elevation (NSTEMI) myocardial infarction: Secondary | ICD-10-CM

## 2010-08-06 ENCOUNTER — Encounter: Payer: Self-pay | Admitting: Cardiovascular Disease

## 2010-08-06 DIAGNOSIS — I2510 Atherosclerotic heart disease of native coronary artery without angina pectoris: Secondary | ICD-10-CM

## 2010-08-06 HISTORY — PX: CARDIAC CATHETERIZATION: SHX172

## 2010-08-12 ENCOUNTER — Telehealth: Payer: Self-pay | Admitting: Cardiovascular Disease

## 2010-08-12 NOTE — Telephone Encounter (Signed)
Notified pt of New Pt appt tomorrow with Gollan.

## 2010-08-13 ENCOUNTER — Ambulatory Visit (INDEPENDENT_AMBULATORY_CARE_PROVIDER_SITE_OTHER): Payer: Self-pay | Admitting: Cardiovascular Disease

## 2010-08-13 ENCOUNTER — Encounter: Payer: Self-pay | Admitting: Cardiovascular Disease

## 2010-08-13 DIAGNOSIS — I2510 Atherosclerotic heart disease of native coronary artery without angina pectoris: Secondary | ICD-10-CM

## 2010-08-13 DIAGNOSIS — Z9861 Coronary angioplasty status: Secondary | ICD-10-CM

## 2010-08-13 DIAGNOSIS — Z955 Presence of coronary angioplasty implant and graft: Secondary | ICD-10-CM | POA: Insufficient documentation

## 2010-08-13 DIAGNOSIS — E785 Hyperlipidemia, unspecified: Secondary | ICD-10-CM

## 2010-08-13 DIAGNOSIS — E119 Type 2 diabetes mellitus without complications: Secondary | ICD-10-CM

## 2010-08-13 NOTE — Progress Notes (Signed)
   Patient ID: Steve Alvarado, male    DOB: 1955/06/03, 55 y.o.   MRN: [id number]  HPI Comments: Steve Alvarado is a very pleasant 55 year old gentleman with diabetes, hypertension, hyperlipidemia with recent admission to the hospital on May 16 for chest pain, cardiac catheterization showing severe lesion in his RCA also diffuse mid to distal left circumflex disease, mild proximal OM disease, moderate mid LAD disease. A bare-metal stent was placed in his right coronary artery. He presents for routine followup from the hospital.  Overall he reports that he feels well. He denies any further chest pain. His energy is good, he has no significant shortness of breath. Overall has no complaints.  He recently got his blood pressure medication yesterday. He is trying to establish primary care followup.  EKG shows normal sinus rhythm with rate 74 beats per minute with T-wave abnormality in V5, V6, one and aVL     Review of Systems  Constitutional: Negative.   HENT: Negative.   Eyes: Negative.   Respiratory: Negative.   Cardiovascular: Negative.   Gastrointestinal: Negative.   Musculoskeletal: Negative.   Skin: Negative.   Neurological: Negative.   Hematological: Negative.   Psychiatric/Behavioral: Negative.   All other systems reviewed and are negative.    BP 152/80  Pulse 74  Ht 6\' 2"  (1.88 m)  Wt 228 lb 12.8 oz (103.783 kg)  BMI 29.38 kg/m2   Physical Exam  Nursing note and vitals reviewed. Constitutional: He is oriented to person, place, and time. He appears well-developed and well-nourished.  HENT:  Head: Normocephalic.  Nose: Nose normal.  Mouth/Throat: Oropharynx is clear and moist.  Eyes: Conjunctivae are normal. Pupils are equal, round, and reactive to light.  Neck: Normal range of motion. Neck supple. No JVD present.  Cardiovascular: Normal rate, regular rhythm, S1 normal, S2 normal, normal heart sounds and intact distal pulses.  Exam reveals no gallop and no friction rub.     No murmur heard. Pulmonary/Chest: Effort normal and breath sounds normal. No respiratory distress. He has no wheezes. He has no rales. He exhibits no tenderness.  Abdominal: Soft. Bowel sounds are normal. He exhibits no distension. There is no tenderness.  Musculoskeletal: Normal range of motion. He exhibits no edema and no tenderness.  Lymphadenopathy:    He has no cervical adenopathy.  Neurological: He is alert and oriented to person, place, and time. Coordination normal.  Skin: Skin is warm and dry. No rash noted. No erythema.  Psychiatric: He has a normal mood and affect. His behavior is normal. Judgment and thought content normal.           Assessment and Plan

## 2010-08-13 NOTE — Patient Instructions (Signed)
You are doing well. No medication changes were made. Please call us if you have new issues that need to be addressed before your next appt.  We will call you for a follow up Appt. In 6 months  

## 2010-08-13 NOTE — Assessment & Plan Note (Signed)
He is on pravastatin 40 mg daily. Goal LDL less than 70.

## 2010-08-13 NOTE — Assessment & Plan Note (Signed)
Bare-metal stent placed to his RCA. Now with no symptoms of chest discomfort. We will continue aggressive medical management.

## 2010-08-13 NOTE — Assessment & Plan Note (Signed)
He does have residual left circumflex and LAD disease. We'll need to continue aggressive medical management. Disease is likely from underlying diabetes.

## 2010-08-13 NOTE — Assessment & Plan Note (Signed)
We have stressed to him he points to the diabetes control. He will try to get a glucometer.

## 2010-10-15 ENCOUNTER — Encounter: Payer: Self-pay | Admitting: Cardiovascular Disease

## 2011-02-14 ENCOUNTER — Encounter: Payer: Self-pay | Admitting: Cardiovascular Disease

## 2011-02-14 ENCOUNTER — Ambulatory Visit (INDEPENDENT_AMBULATORY_CARE_PROVIDER_SITE_OTHER): Payer: Self-pay | Admitting: Cardiovascular Disease

## 2011-02-14 DIAGNOSIS — R079 Chest pain, unspecified: Secondary | ICD-10-CM

## 2011-02-14 DIAGNOSIS — E785 Hyperlipidemia, unspecified: Secondary | ICD-10-CM

## 2011-02-14 DIAGNOSIS — E119 Type 2 diabetes mellitus without complications: Secondary | ICD-10-CM

## 2011-02-14 DIAGNOSIS — I2510 Atherosclerotic heart disease of native coronary artery without angina pectoris: Secondary | ICD-10-CM

## 2011-02-14 DIAGNOSIS — I10 Essential (primary) hypertension: Secondary | ICD-10-CM | POA: Insufficient documentation

## 2011-02-14 MED ORDER — ISOSORBIDE MONONITRATE ER 30 MG PO TB24: 30.0000 mg | ORAL_TABLET | Freq: Every day | ORAL | Status: DC

## 2011-02-14 MED ORDER — CLOPIDOGREL BISULFATE 75 MG PO TABS: 75.0000 mg | ORAL_TABLET | Freq: Every day | ORAL | Status: AC

## 2011-02-14 MED ORDER — METOPROLOL TARTRATE 25 MG PO TABS: 25.0000 mg | ORAL_TABLET | Freq: Two times a day (BID) | ORAL | Status: DC

## 2011-02-14 NOTE — Assessment & Plan Note (Signed)
We will start him back on HCTZ 25 mg daily, metoprolol 25 mg b.i.d.

## 2011-02-14 NOTE — Assessment & Plan Note (Signed)
Symptoms of chest pain are brief, atypical in nature. We have suggested he get back on his medications and monitor his symptoms. If he continues to have symptoms, we have stressed that he call our office for a stress test.

## 2011-02-14 NOTE — Assessment & Plan Note (Signed)
We have suggested he restart his cholesterol medication

## 2011-02-14 NOTE — Progress Notes (Signed)
Duplicate message. 

## 2011-02-14 NOTE — Progress Notes (Signed)
Patient ID: Steve Alvarado, male    DOB: 07-21-1955, 55 y.o.   MRN: [id number]  HPI Comments: Steve Alvarado is a very pleasant 55 year old Steve Alvarado with diabetes, hypertension, hyperlipidemia with recent admission to the hospital on May 16 for chest pain, cardiac catheterization showing severe lesion in his RCA also diffuse mid to distal left circumflex disease, mild proximal OM disease, moderate mid LAD disease. A bare-metal stent was placed in his right coronary artery. He presents for routine followup.   He reports one episode of chest tightness lasting for one to 2 minutes in October described as a tightness. He has had one episode since then that was not a significant. He has been otherwise active with no complaints but does have some mild malaise. He feels that his energy is not as good. Some shortness of breath. He has not been taking his medications and it is not clear how long. he is requesting refills on most of his medications  He does report having dry wall problems in his house with significant mold problems. This is scheduled to be fixed soon.  EKG shows normal sinus rhythm with rate 83 beats per minute with T-wave abnormality in V5, V6, one and aVL   Outpatient Encounter Prescriptions as of 02/14/2011  Medication Sig Dispense Refill  . aspirin 325 MG tablet Take 325 mg by mouth daily.        Marland Kitchen glipiZIDE (GLUCOTROL) 5 MG 24 hr tablet Take 5 mg by mouth daily.        . hydrochlorothiazide (HYDRODIURIL) 25 MG tablet Take 25 mg by mouth daily.  NOT TAKING      . insulin NPH-insulin regular (NOVOLIN 70/30) (70-30) 100 UNIT/ML injection Inject 30 Units into the skin 2 (two) times daily with a meal.        . metFORMIN (GLUCOPHAGE) 500 MG tablet Take 500 mg by mouth.        . nitroGLYCERIN (NITROSTAT) 0.4 MG SL tablet Place 0.4 mg under the tongue every 5 (five) minutes as needed.        .  hydrALAZINE (APRESOLINE) 25 MG tablet Take 25 mg by mouth 4 (four) times daily.  NOT TAKING      .   isosorbide mononitrate (IMDUR) 30 MG 24 hr tablet Take 30 mg by mouth daily.  NOT TAKING      .  metoprolol (LOPRESSOR) 50 MG tablet Take 50 mg by mouth 2 (two) times daily.  NOT TAKING      .  prasugrel (EFFIENT) 10 MG TABS Take 10 mg by mouth daily. NOT TAKING      .  pravastatin (PRAVACHOL) 40 MG tablet Take 40 mg by mouth daily.  NOT TAKING         Review of Systems  Constitutional: Negative.   HENT: Negative.   Eyes: Negative.   Respiratory: Positive for shortness of breath.   Cardiovascular: Positive for chest pain.  Gastrointestinal: Negative.   Musculoskeletal: Negative.   Skin: Negative.   Neurological: Negative.   Hematological: Negative.   Psychiatric/Behavioral: Negative.   All other systems reviewed and are negative.    BP 140/92  Pulse 83  Ht 6\' 2"  (1.88 m)  Wt 224 lb 4 oz (101.719 kg)  BMI 28.79 kg/m2   Physical Exam  Nursing note and vitals reviewed. Constitutional: He is oriented to person, place, and time. He appears well-developed and well-nourished.  HENT:  Head: Normocephalic.  Nose: Nose normal.  Mouth/Throat: Oropharynx is clear and moist.  Eyes: Conjunctivae are normal. Pupils are equal, round, and reactive to light.  Neck: Normal range of motion. Neck supple. No JVD present.  Cardiovascular: Normal rate, regular rhythm, S1 normal, S2 normal, normal heart sounds and intact distal pulses.  Exam reveals no gallop and no friction rub.   No murmur heard. Pulmonary/Chest: Effort normal and breath sounds normal. No respiratory distress. He has no wheezes. He has no rales. He exhibits no tenderness.  Abdominal: Soft. Bowel sounds are normal. He exhibits no distension. There is no tenderness.  Musculoskeletal: Normal range of motion. He exhibits no edema and no tenderness.  Lymphadenopathy:    He has no cervical adenopathy.  Neurological: He is alert and oriented to person, place, and time. Coordination normal.  Skin: Skin is warm and dry. No rash  noted. No erythema.  Psychiatric: He has a normal mood and affect. His behavior is normal. Judgment and thought content normal.           Assessment and Plan

## 2011-02-14 NOTE — Progress Notes (Signed)
Spoke to pharmacist on refills for imdur, metoprolol and plavix.

## 2011-02-14 NOTE — Patient Instructions (Addendum)
You are doing well.  Please restart your medications. The metoprolol has been changed to 25 mg twice a day Please take the effient until you run out of samples. When you run out of samples, change to plavix once a day  Hold the hydralazine  Please call if you continue to have chest pains  Please call us if you have new issues that need to be addressed before your next appt.  The office will contact you for a follow up Appt. In 6 months

## 2011-02-14 NOTE — Assessment & Plan Note (Signed)
We have suggested he restart his diabetes medication.

## 2011-10-14 ENCOUNTER — Ambulatory Visit: Payer: Self-pay | Admitting: Cardiovascular Disease

## 2011-10-17 ENCOUNTER — Inpatient Hospital Stay: Payer: Self-pay | Admitting: Internal Medicine

## 2011-10-17 DIAGNOSIS — I219 Acute myocardial infarction, unspecified: Secondary | ICD-10-CM

## 2011-10-17 LAB — CBC WITH DIFFERENTIAL/PLATELET
Basophil #: 0 10*3/uL (ref 0.0–0.1)
Eosinophil #: 0.2 10*3/uL (ref 0.0–0.7)
HCT: 40.1 % (ref 40.0–52.0)
Lymphocyte %: 34.5 %
MCH: 30.4 pg (ref 26.0–34.0)
MCV: 85 fL (ref 80–100)
Monocyte %: 9.6 %
Neutrophil #: 3.8 10*3/uL (ref 1.4–6.5)
Neutrophil %: 52 %
Platelet: 237 10*3/uL (ref 150–440)
RBC: 4.72 10*6/uL (ref 4.40–5.90)
RDW: 13.3 % (ref 11.5–14.5)
WBC: 7.2 10*3/uL (ref 3.8–10.6)

## 2011-10-17 LAB — TROPONIN I
Troponin-I: 0.1 ng/mL — ABNORMAL HIGH
Troponin-I: 0.04 ng/mL

## 2011-10-17 LAB — CBC
HGB: 15.6 g/dL (ref 13.0–18.0)
MCH: 30.5 pg (ref 26.0–34.0)
MCHC: 35.3 g/dL (ref 32.0–36.0)
Platelet: 229 10*3/uL (ref 150–440)

## 2011-10-17 LAB — BASIC METABOLIC PANEL
Anion Gap: 10 (ref 7–16)
BUN: 13 mg/dL (ref 7–18)
EGFR (African American): >60
EGFR (Non-African Amer.): >60
Glucose: 238 mg/dL — ABNORMAL HIGH (ref 65–99)
Potassium: 3.8 mmol/L (ref 3.5–5.1)
Sodium: 140 mmol/L (ref 136–145)

## 2011-10-17 LAB — CK TOTAL AND CKMB (NOT AT ARMC)
CK, Total: 297 U/L — ABNORMAL HIGH (ref 35–232)
CK, Total: 225 U/L (ref 35–232)
CK, Total: 202 U/L (ref 35–232)
CK-MB: 3.7 ng/mL — ABNORMAL HIGH (ref 0.5–3.6)
CK-MB: 3.9 ng/mL — ABNORMAL HIGH (ref 0.5–3.6)
CK-MB: 4 ng/mL — ABNORMAL HIGH (ref 0.5–3.6)

## 2011-10-17 LAB — APTT: Activated PTT: 119.2 secs — ABNORMAL HIGH (ref 23.6–35.9)

## 2011-10-17 IMAGING — CR DG CHEST 2V
1 series · 3 of 3 positions shown · non-contrast
Comparison: none

REASON FOR EXAM: chest pain
COMMENTS:

PROCEDURE:     DXR - DXR CHEST PA (OR AP) AND LATERAL  - [DATE] [DATE]
RESULT:     Comparison: None

[Series 1: pa · 0.17mm/px · 3 of 3 slices shown]
[im 1/3]
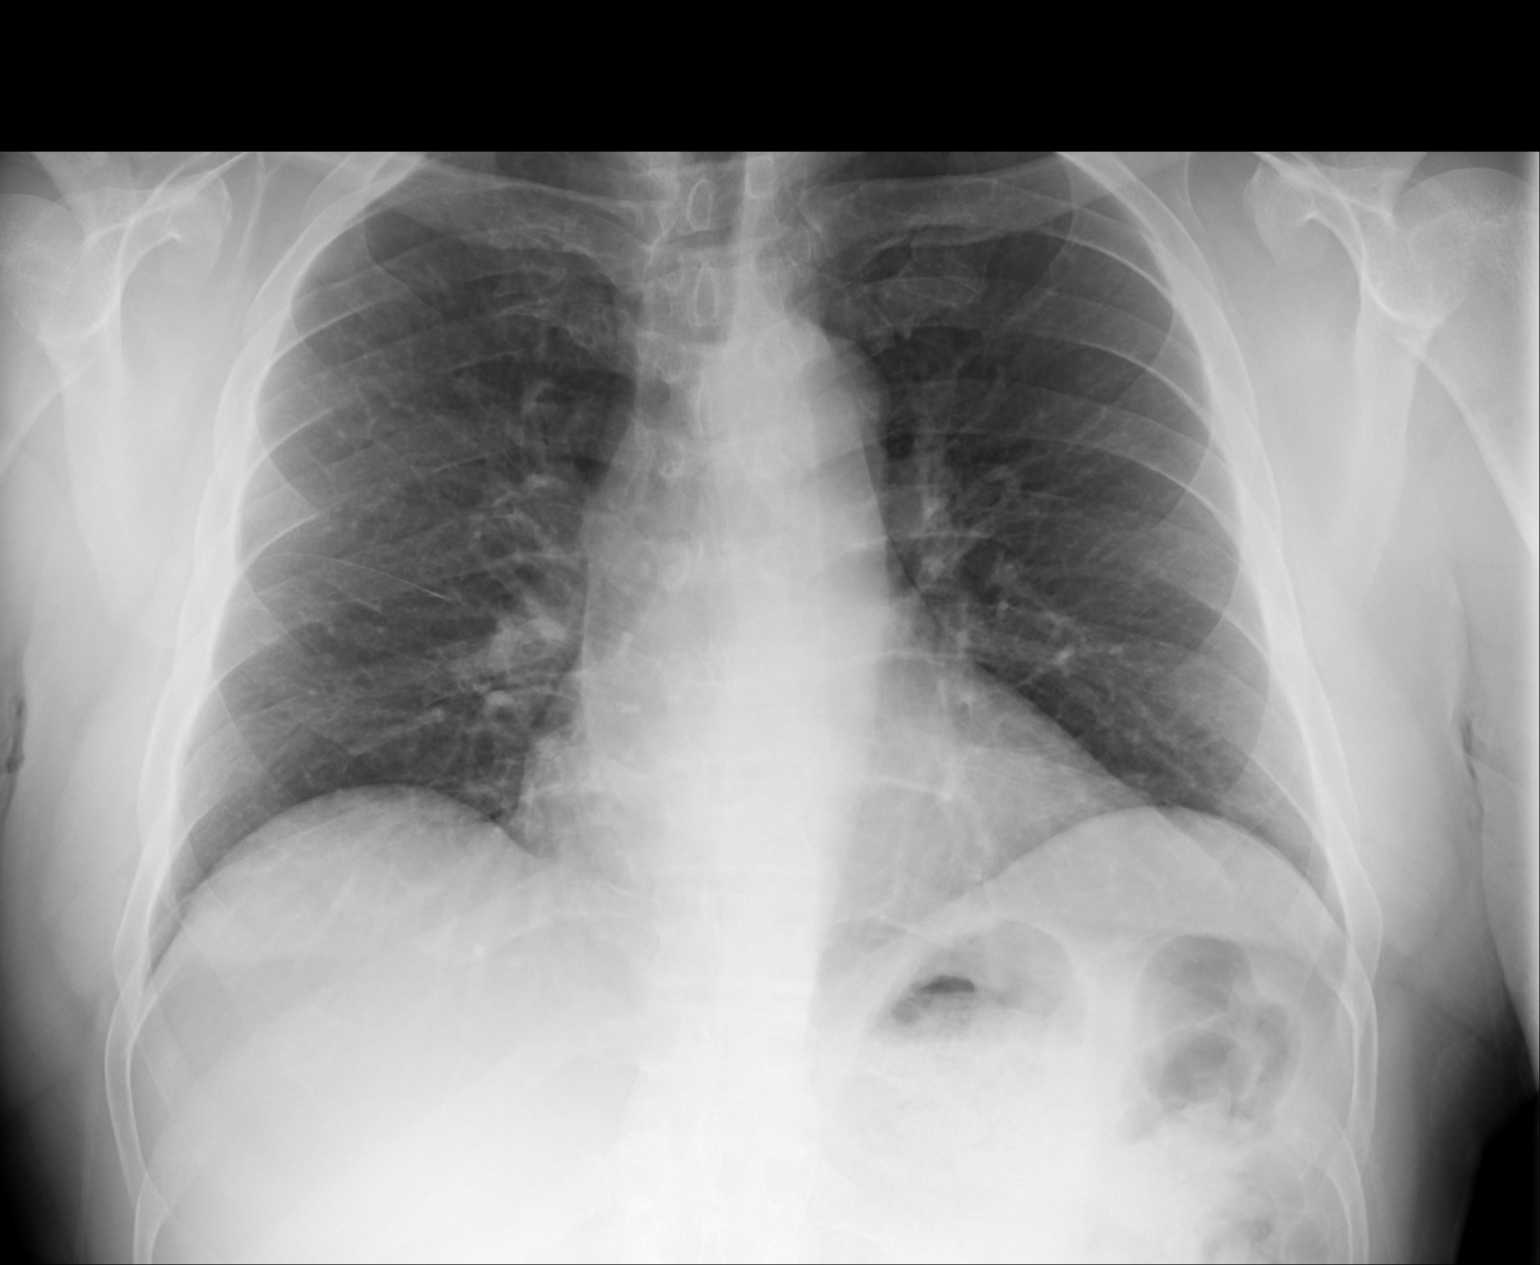
[im 2/3]
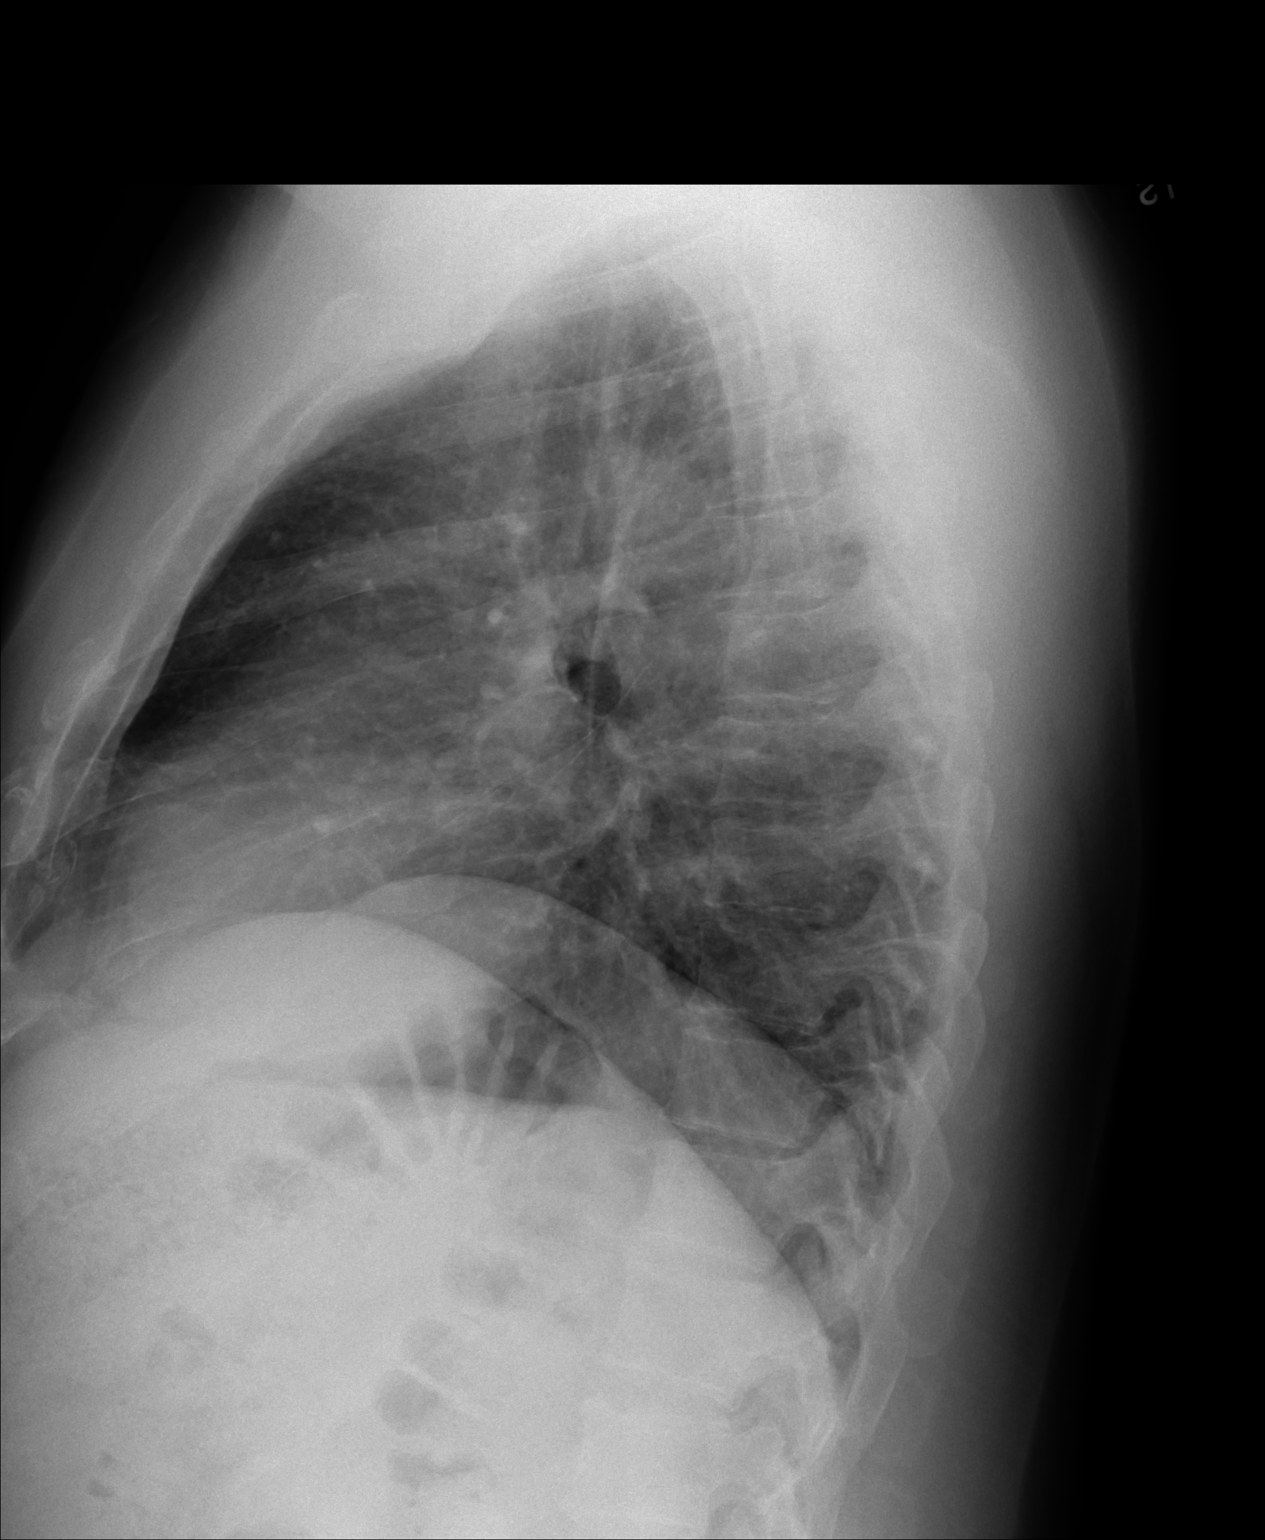
[im 3/3]
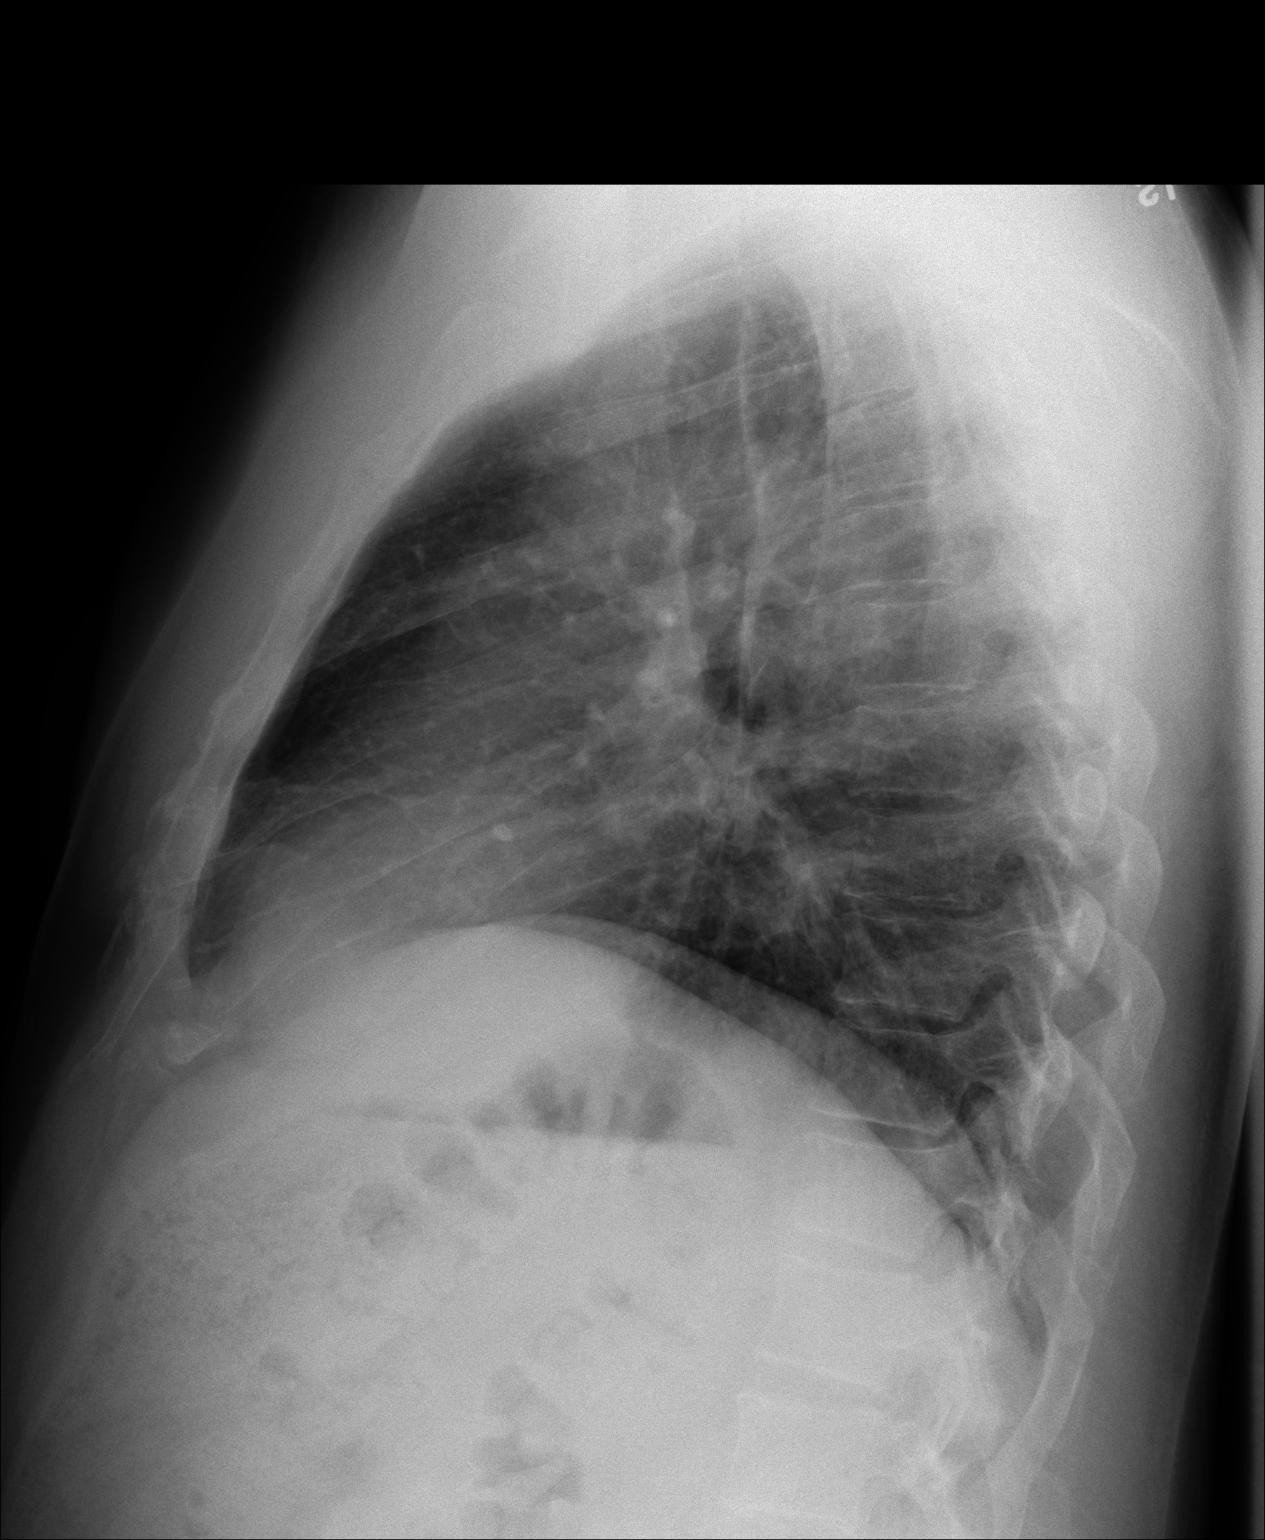

[3 of 3 positions shown; findings below may reference images not displayed]

FINDINGS: PA and lateral chest radiographs are provided.  There is no focal
parenchymal opacity, pleural effusion, or pneumothorax. The heart and
mediastinum are unremarkable.  The osseous structures are unremarkable.
IMPRESSION: No acute disease of the che[REDACTED]

## 2011-10-18 DIAGNOSIS — I214 Non-ST elevation (NSTEMI) myocardial infarction: Secondary | ICD-10-CM

## 2011-10-18 LAB — DRUG SCREEN, URINE
Barbiturates, Ur Screen: NEGATIVE (ref ?–200)
Cocaine Metabolite,Ur ~~LOC~~: NEGATIVE (ref ?–300)
Methadone, Ur Screen: NEGATIVE (ref ?–300)

## 2011-10-19 ENCOUNTER — Encounter: Payer: Self-pay | Admitting: Cardiovascular Disease

## 2011-10-20 ENCOUNTER — Encounter: Payer: Self-pay | Admitting: Cardiovascular Disease

## 2011-10-20 ENCOUNTER — Ambulatory Visit (INDEPENDENT_AMBULATORY_CARE_PROVIDER_SITE_OTHER): Payer: Self-pay | Admitting: Cardiovascular Disease

## 2011-10-20 VITALS — BP 170/90 | HR 72 | Ht 74.0 in | Wt 231.0 lb

## 2011-10-20 DIAGNOSIS — E785 Hyperlipidemia, unspecified: Secondary | ICD-10-CM

## 2011-10-20 DIAGNOSIS — E119 Type 2 diabetes mellitus without complications: Secondary | ICD-10-CM

## 2011-10-20 DIAGNOSIS — I2510 Atherosclerotic heart disease of native coronary artery without angina pectoris: Secondary | ICD-10-CM

## 2011-10-20 DIAGNOSIS — I10 Essential (primary) hypertension: Secondary | ICD-10-CM

## 2011-10-20 MED ORDER — METFORMIN HCL 1000 MG PO TABS: 1000.0000 mg | ORAL_TABLET | Freq: Two times a day (BID) | ORAL | Status: DC

## 2011-10-20 MED ORDER — LISINOPRIL 40 MG PO TABS: 40.0000 mg | ORAL_TABLET | Freq: Every day | ORAL | Status: DC

## 2011-10-20 MED ORDER — PRAVASTATIN SODIUM 40 MG PO TABS: 40.0000 mg | ORAL_TABLET | Freq: Every day | ORAL | Status: DC

## 2011-10-20 MED ORDER — AMLODIPINE BESYLATE 10 MG PO TABS: 10.0000 mg | ORAL_TABLET | Freq: Every day | ORAL | Status: DC

## 2011-10-20 MED ORDER — CLOPIDOGREL BISULFATE 75 MG PO TABS: 75.0000 mg | ORAL_TABLET | Freq: Every day | ORAL | Status: DC

## 2011-10-20 NOTE — Assessment & Plan Note (Signed)
Blood pressure is very elevated. We'll increase the lisinopril to 40 mg daily, start amlodipine 10 mg daily, continue metoprolol

## 2011-10-20 NOTE — Assessment & Plan Note (Signed)
Continue pravastatin daily. Goal LDL less than 70

## 2011-10-20 NOTE — Progress Notes (Signed)
Patient ID: Steve Alvarado, male    DOB: [birth year], 56 y.o.   MRN: [id number]  HPI Comments: Steve Alvarado is a very pleasant 56 year old gentleman with diabetes, hypertension, hyperlipidemia with recent admission to the hospital on May 16 for chest pain, cardiac catheterization showing severe lesion in his RCA also diffuse mid to distal left circumflex disease, mild proximal OM disease, moderate mid LAD disease. A bare-metal stent was placed in his right coronary artery. He presents for routine followup.   He has not been able to afford his medications as he does not have insurance. He reports that his relatives are donating money to buy his effient he has not been taking his other blood pressure pills.  He was recently in the emergency room for chest pain. Admitted overnight. Blood pressure medications were restarted. He did not want a cardiac catheterization for his symptoms and the case was discussed with Dr.  Kirke Corin. Medical management was recommended .  He had run out of his nitroglycerin sublingual. He has not had any further episodes since that time. Currently he is taking aspirin, lisinopril, Toprol, effient, metformin.   Hemoglobin A1c was greater than 8  EKG shows normal sinus rhythm with rate 62 beats per minute with T-wave abnormality in V5, V6, one and aVL   Outpatient Encounter Prescriptions as of 10/20/2011  Medication Sig Dispense Refill  . aspirin 81 MG tablet Take 81 mg by mouth daily.      . insulin NPH-insulin regular (NOVOLIN 70/30) (70-30) 100 UNIT/ML injection Inject 30 Units into the skin 2 (two) times daily with a meal.        . metoprolol (LOPRESSOR) 25 MG tablet Take 1 tablet (25 mg total) by mouth 2 (two) times daily.  60 tablet  11  . nitroGLYCERIN (NITROSTAT) 0.4 MG SL tablet Place 0.4 mg under the tongue every 5 (five) minutes as needed.        . pravastatin (PRAVACHOL) 40 MG tablet Take 1 tablet (40 mg total) by mouth daily.  30 tablet  6  .  lisinopril  (PRINIVIL,ZESTRIL) 20 MG tablet Take 20 mg by mouth daily.      .  metFORMIN (GLUCOPHAGE) 500 MG tablet Take 500 mg by mouth 2 (two) times daily.       .  prasugrel (EFFIENT) 10 MG TABS Take by mouth.      .  pravastatin (PRAVACHOL) 40 MG tablet Take 40 mg by mouth daily.        Review of Systems  Constitutional: Negative.   HENT: Negative.   Eyes: Negative.   Gastrointestinal: Negative.   Musculoskeletal: Negative.   Skin: Negative.   Neurological: Negative.   Hematological: Negative.   Psychiatric/Behavioral: Negative.   All other systems reviewed and are negative.    BP 170/90  Pulse 72  Ht 6\' 2"  (1.88 m)  Wt 231 lb (104.781 kg)  BMI 29.66 kg/m2  Physical Exam  Nursing note and vitals reviewed. Constitutional: He is oriented to person, place, and time. He appears well-developed and well-nourished.  HENT:  Head: Normocephalic.  Nose: Nose normal.  Mouth/Throat: Oropharynx is clear and moist.  Eyes: Conjunctivae are normal. Pupils are equal, round, and reactive to light.  Neck: Normal range of motion. Neck supple. No JVD present.  Cardiovascular: Normal rate, regular rhythm, S1 normal, S2 normal, normal heart sounds and intact distal pulses.  Exam reveals no gallop and no friction rub.   No murmur heard. Pulmonary/Chest: Effort normal and breath sounds normal.  No respiratory distress. He has no wheezes. He has no rales. He exhibits no tenderness.  Abdominal: Soft. Bowel sounds are normal. He exhibits no distension. There is no tenderness.  Musculoskeletal: Normal range of motion. He exhibits no edema and no tenderness.  Lymphadenopathy:    He has no cervical adenopathy.  Neurological: He is alert and oriented to person, place, and time. Coordination normal.  Skin: Skin is warm and dry. No rash noted. No erythema.  Psychiatric: He has a normal mood and affect. His behavior is normal. Judgment and thought content normal.           Assessment and Plan

## 2011-10-20 NOTE — Assessment & Plan Note (Signed)
We'll increase the metformin to 1000 mg twice a day, recent hemoglobin A1c was at 8

## 2011-10-20 NOTE — Assessment & Plan Note (Signed)
Recent chest pain symptoms. Medication noncompliance him a significantly elevated blood pressure. No recent chest pain. Will try to manage medically. We will change him to Plavix for cost reasons. Continue aspirin.

## 2011-10-20 NOTE — Patient Instructions (Addendum)
You are doing well. Please start amlodipine 10 mg daily Please increase lisinopril to 40 mg daily Stop the effient  Start plavix one a day Please increase the metformin to 1000 mg twice a day  Please call us if you have new issues that need to be addressed before your next appt.  Your physician wants you to follow-up in: 6 months.  You will receive a reminder letter in the mail two months in advance. If you don't receive a letter, please call our office to schedule the follow-up appointment.

## 2011-10-26 ENCOUNTER — Encounter: Payer: Self-pay | Admitting: Cardiovascular Disease

## 2011-11-01 ENCOUNTER — Telehealth: Payer: Self-pay | Admitting: Cardiovascular Disease

## 2011-11-01 NOTE — Telephone Encounter (Signed)
Pt wife called stating the pt lisinoopril is making pt cough. Wants to know if he can take something else.

## 2011-11-01 NOTE — Telephone Encounter (Signed)
We could change to losartan 100 mg daily

## 2011-11-01 NOTE — Telephone Encounter (Signed)
N/A.  LMTC. 

## 2011-11-02 NOTE — Telephone Encounter (Signed)
Steve Alvarado states he went to [facility] yesterday and they changed his lisinopril.  He doesn't remember what they changed it to but will call back when he arrives home to let us know.

## 2011-11-04 ENCOUNTER — Telehealth: Payer: Self-pay | Admitting: Cardiovascular Disease

## 2011-11-04 NOTE — Telephone Encounter (Signed)
lmtcb Debbie Traxton Kolenda RN  

## 2011-11-04 NOTE — Telephone Encounter (Signed)
Pt comes in today b/c of his cough.  He stopped his Lisinopril 4 days ago. He states he has had the cough since starting Lisinopril. He is coughing now and has a runny nose & watery eyes. Denies fever. Dr. Mariah Milling has reviewed. Recommended pt follow-up with pcp b/c this may be upper respiratory but is not cardiac or ACE related. I spoke with pt regarding recommendations. He may go to the Urgent care if becomes worse or to the [facility] on Monday.  He understands that his cough does not appear to be Lisinopril related. Suggested OTC guiafenisen for cough He will call back later today regarding the name of the medication he was switched to. He is already on Metoprolol Mylo Red RN

## 2011-11-04 NOTE — Telephone Encounter (Signed)
Pt calling because he was taking lisinopril and made him cough really. He went to PCP and they changed to metoprolol wants to know if ok.

## 2012-03-14 ENCOUNTER — Emergency Department: Payer: Self-pay | Admitting: Emergency Medicine

## 2012-03-14 IMAGING — CR DG CHEST 2V
1 series · 2 of 2 positions shown · non-contrast
Comparison: none

REASON FOR EXAM: cough/congestion
COMMENTS:

PROCEDURE:     DXR - DXR CHEST PA (OR AP) AND LATERAL  - [DATE]  [DATE]
RESULT:     Comparison: [DATE]

[Series 1: pa · 0.17mm/px · 2 of 2 slices shown]
[im 1/2]
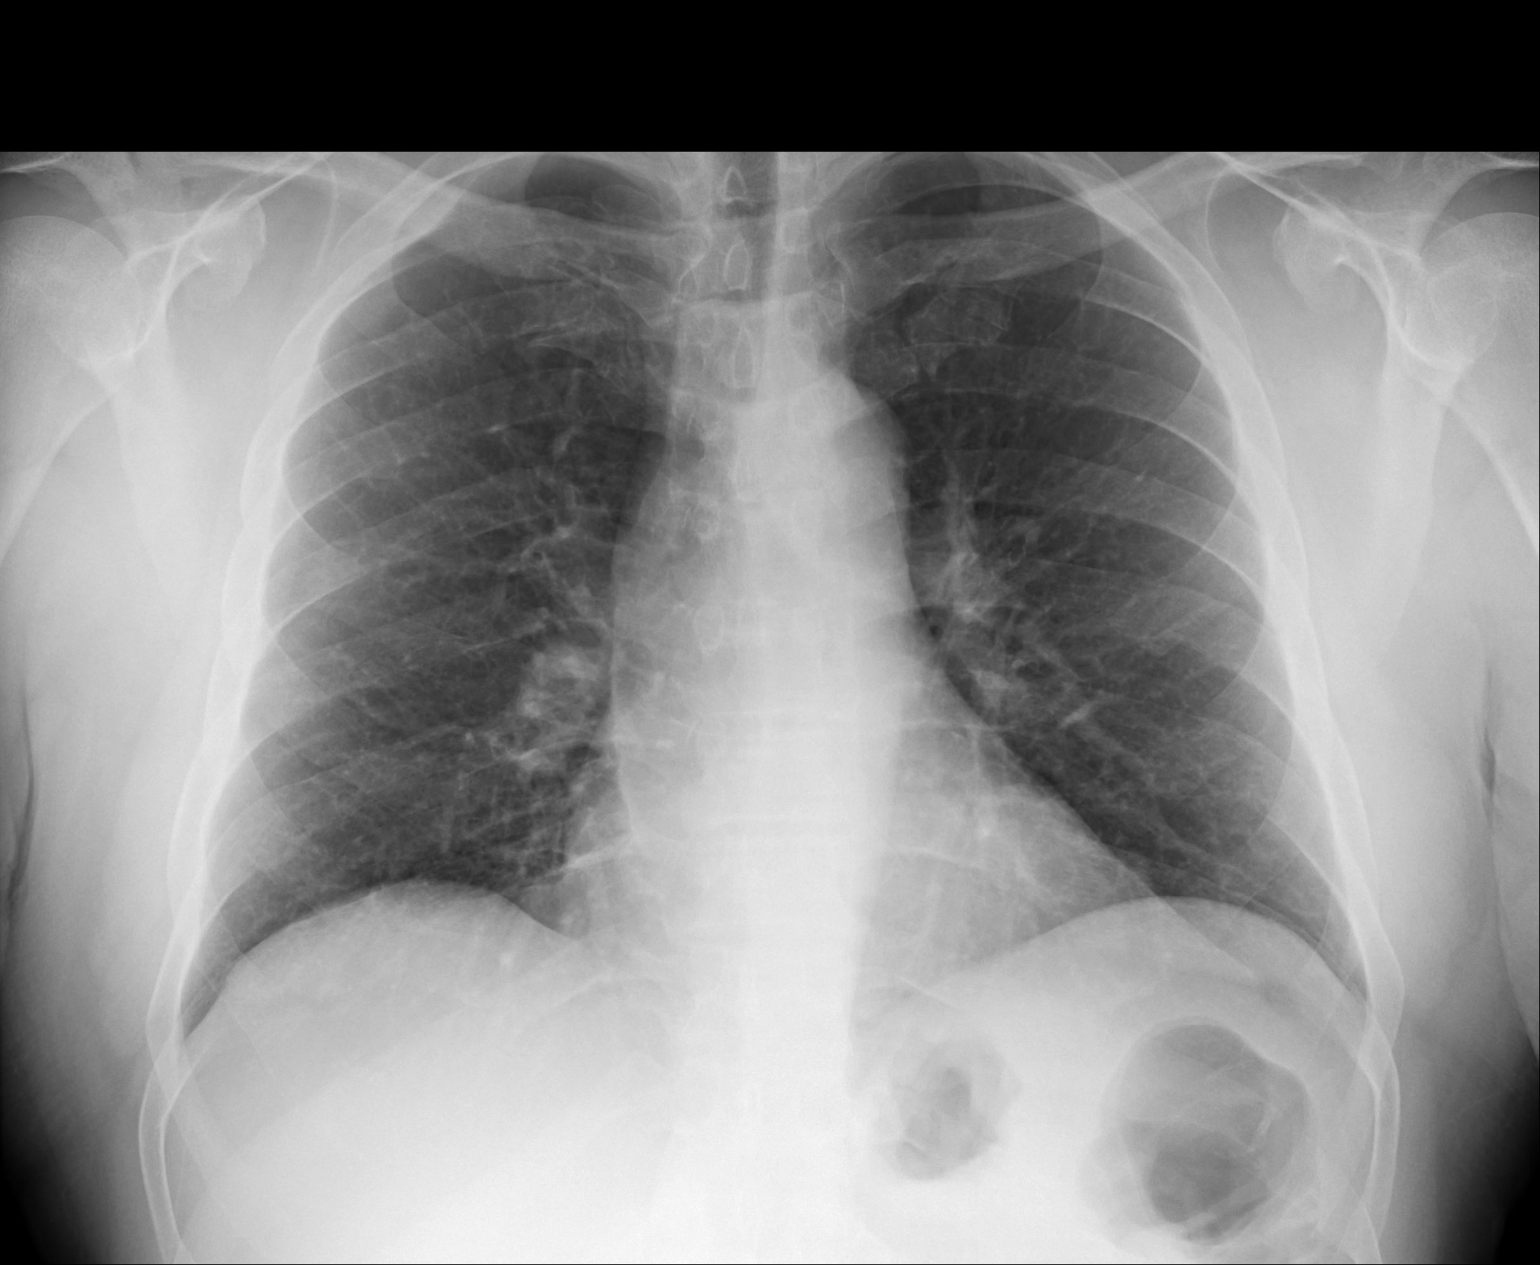
[im 2/2]
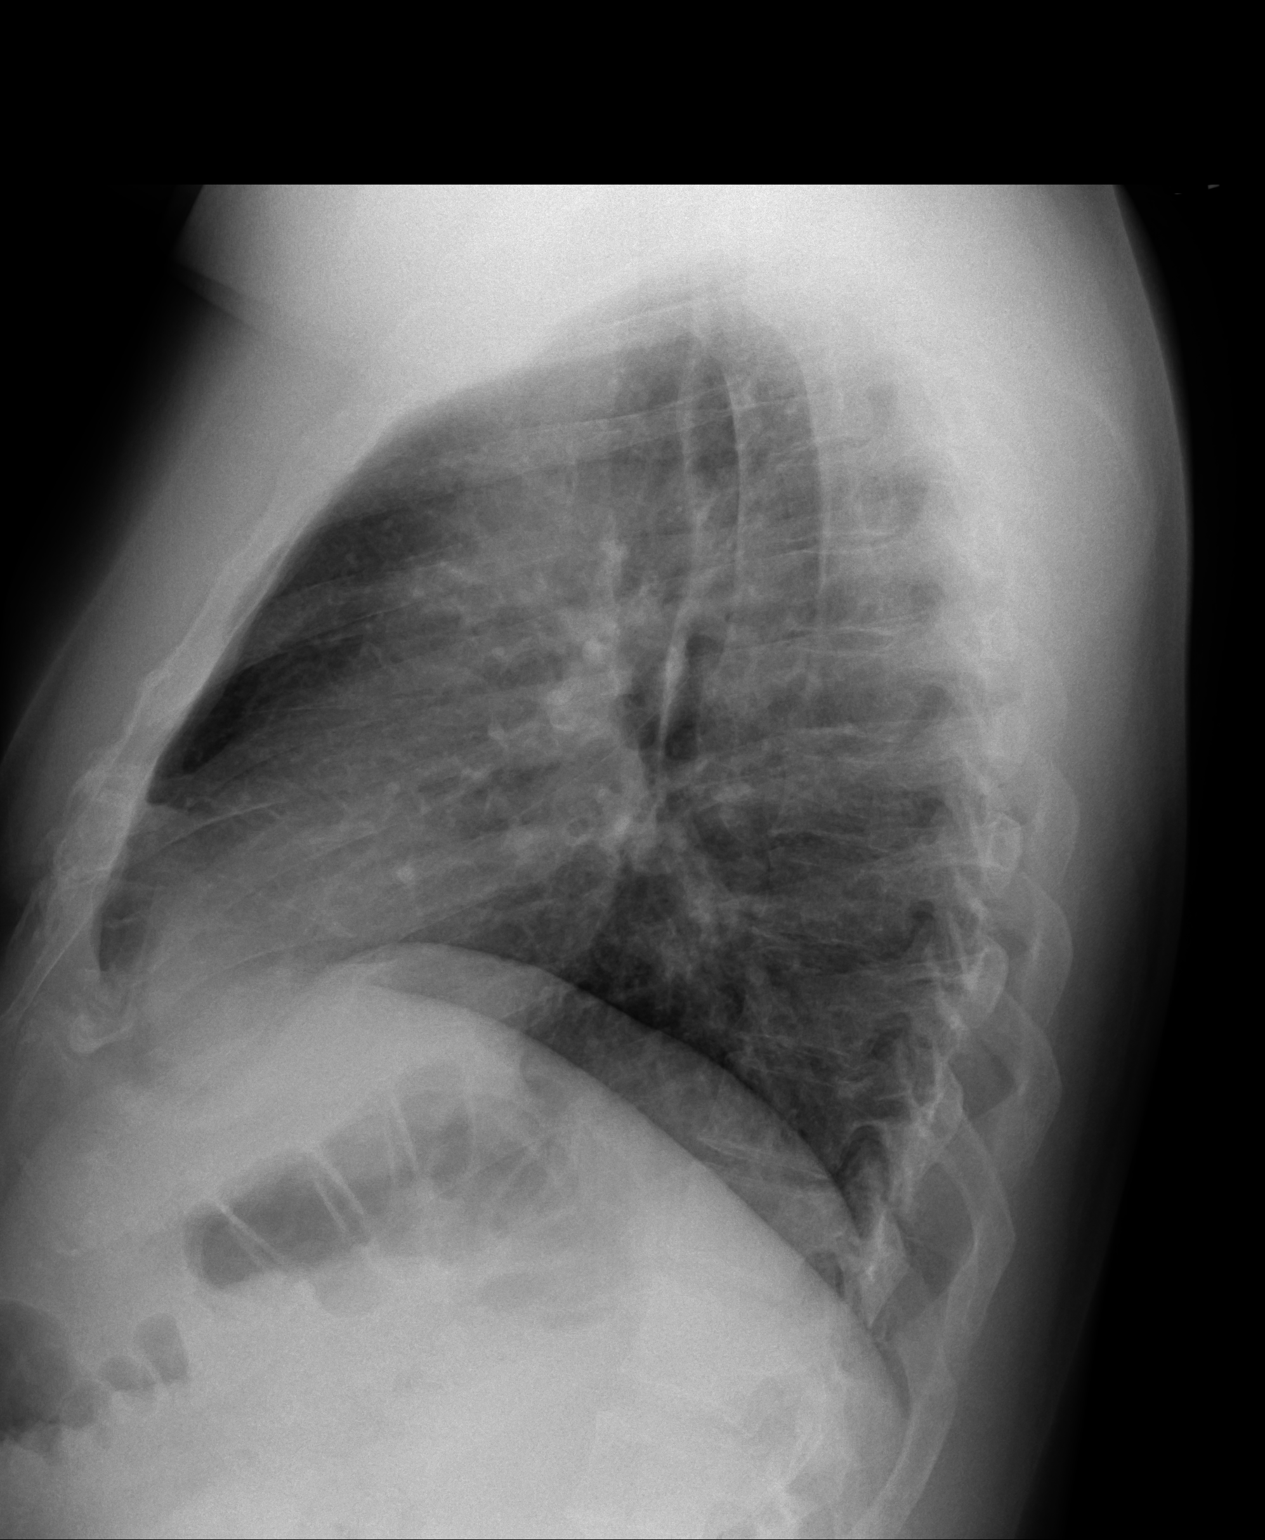

[2 of 2 positions shown; findings below may reference images not displayed]

FINDINGS: PA and lateral chest radiographs are provided.  There is no focal
parenchymal opacity, pleural effusion, or pneumothorax. The heart and
mediastinum are unremarkable.  The osseous structures are unremarkable.
IMPRESSION: No acute disease of the che[REDACTED]

## 2012-03-17 ENCOUNTER — Emergency Department: Payer: Self-pay | Admitting: Emergency Medicine

## 2012-03-17 LAB — CBC
MCH: 30.3 pg (ref 26.0–34.0)
MCHC: 35.8 g/dL (ref 32.0–36.0)
MCV: 85 fL (ref 80–100)
Platelet: 195 10*3/uL (ref 150–440)
RBC: 4.82 10*6/uL (ref 4.40–5.90)
RDW: 12.9 % (ref 11.5–14.5)
WBC: 4.9 10*3/uL (ref 3.8–10.6)

## 2012-03-17 LAB — TROPONIN I: Troponin-I: 0.03 ng/mL

## 2012-03-17 LAB — DRUG SCREEN, URINE
Barbiturates, Ur Screen: NEGATIVE (ref ?–200)
Benzodiazepine, Ur Scrn: NEGATIVE (ref ?–200)
Cannabinoid 50 Ng, Ur ~~LOC~~: NEGATIVE (ref ?–50)
Cocaine Metabolite,Ur ~~LOC~~: NEGATIVE (ref ?–300)
MDMA (Ecstasy)Ur Screen: NEGATIVE (ref ?–500)
Opiate, Ur Screen: NEGATIVE (ref ?–300)
Phencyclidine (PCP) Ur S: NEGATIVE (ref ?–25)

## 2012-03-17 LAB — COMPREHENSIVE METABOLIC PANEL
Albumin: 3.2 g/dL — ABNORMAL LOW (ref 3.4–5.0)
Anion Gap: 9 (ref 7–16)
Calcium, Total: 8 mg/dL — ABNORMAL LOW (ref 8.5–10.1)
Co2: 24 mmol/L (ref 21–32)
EGFR (African American): >60
EGFR (Non-African Amer.): >60
Glucose: 99 mg/dL (ref 65–99)
Osmolality: 273 (ref 275–301)
Potassium: 3.6 mmol/L (ref 3.5–5.1)
SGOT(AST): 59 U/L — ABNORMAL HIGH (ref 15–37)
Sodium: 136 mmol/L (ref 136–145)

## 2012-03-17 IMAGING — CR DG CHEST 2V
1 series · 2 of 2 positions shown · non-contrast
Comparison: none

REASON FOR EXAM: cough
COMMENTS:   May transport without cardiac monitor

PROCEDURE:     DXR - DXR CHEST PA (OR AP) AND LATERAL  - [DATE]  [DATE]
RESULT:     The lungs are clear. The cardiac silhouette and visualized bony
skeleton are unremarkable. The patient has taken a shallow inspiration.

[Series 1: x chest ap · 0.14mm/px · 2 of 2 slices shown]
[im 1/2]
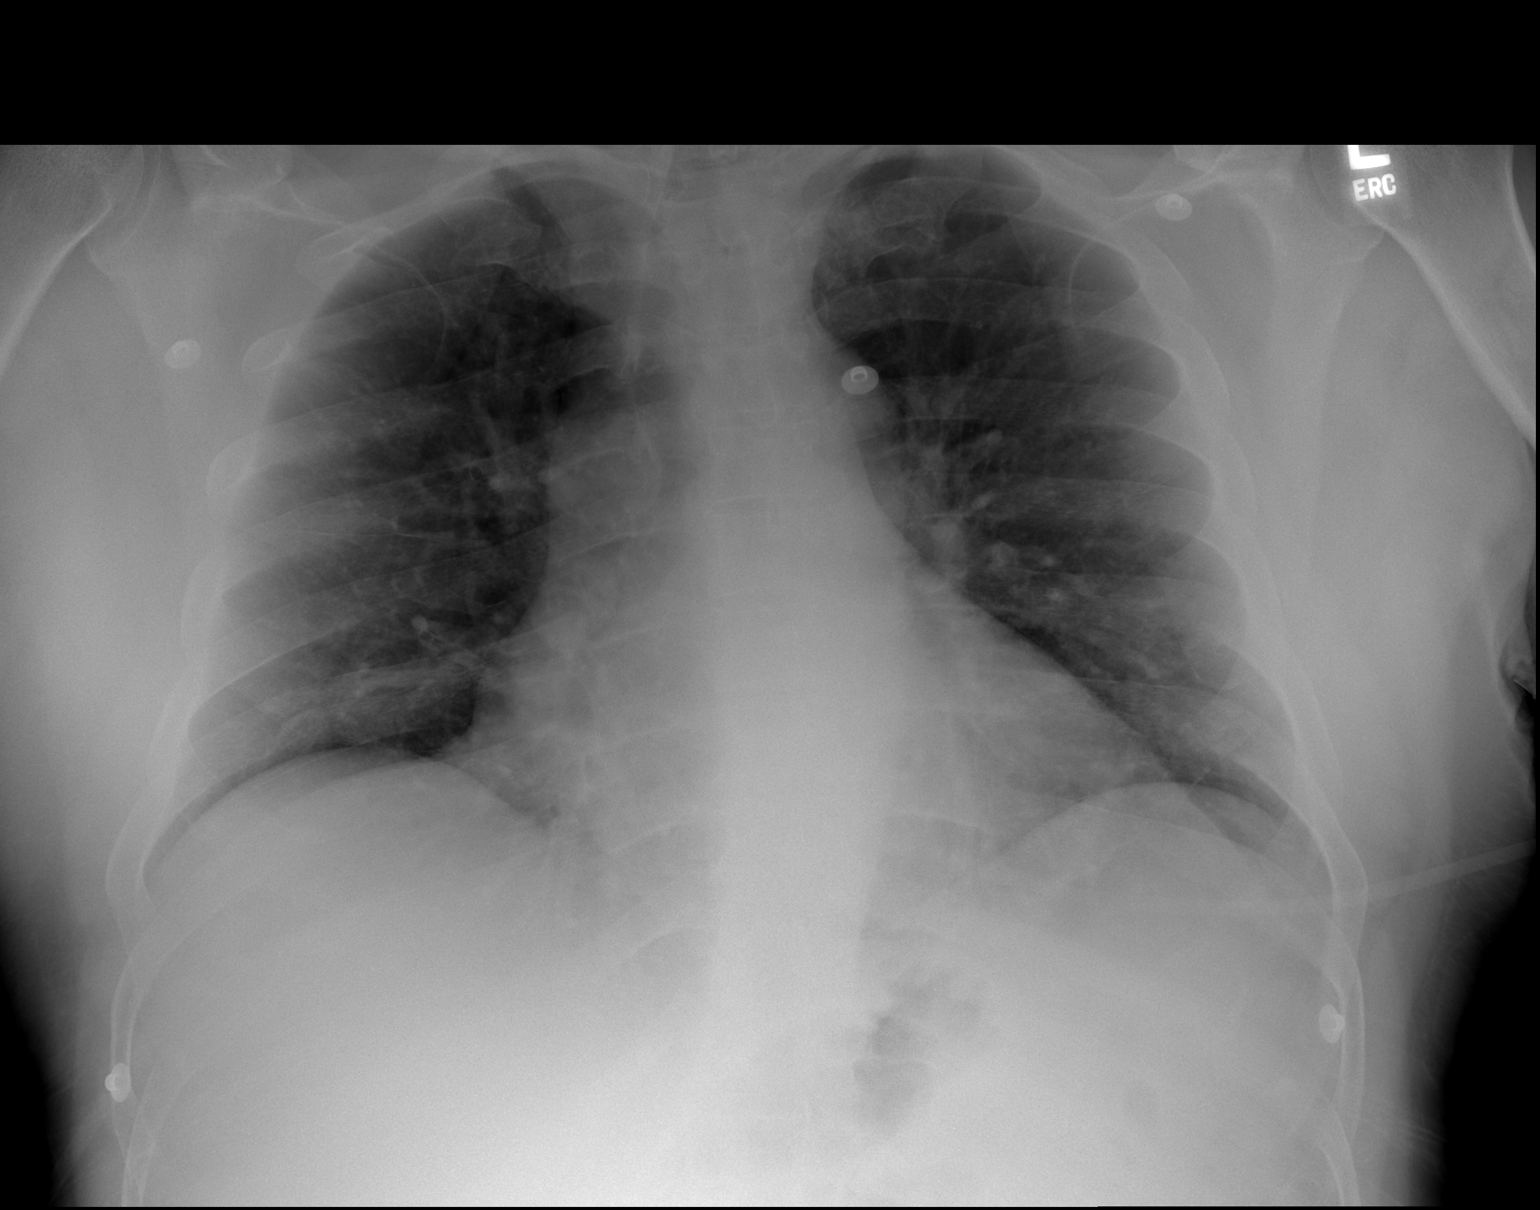
[im 2/2]
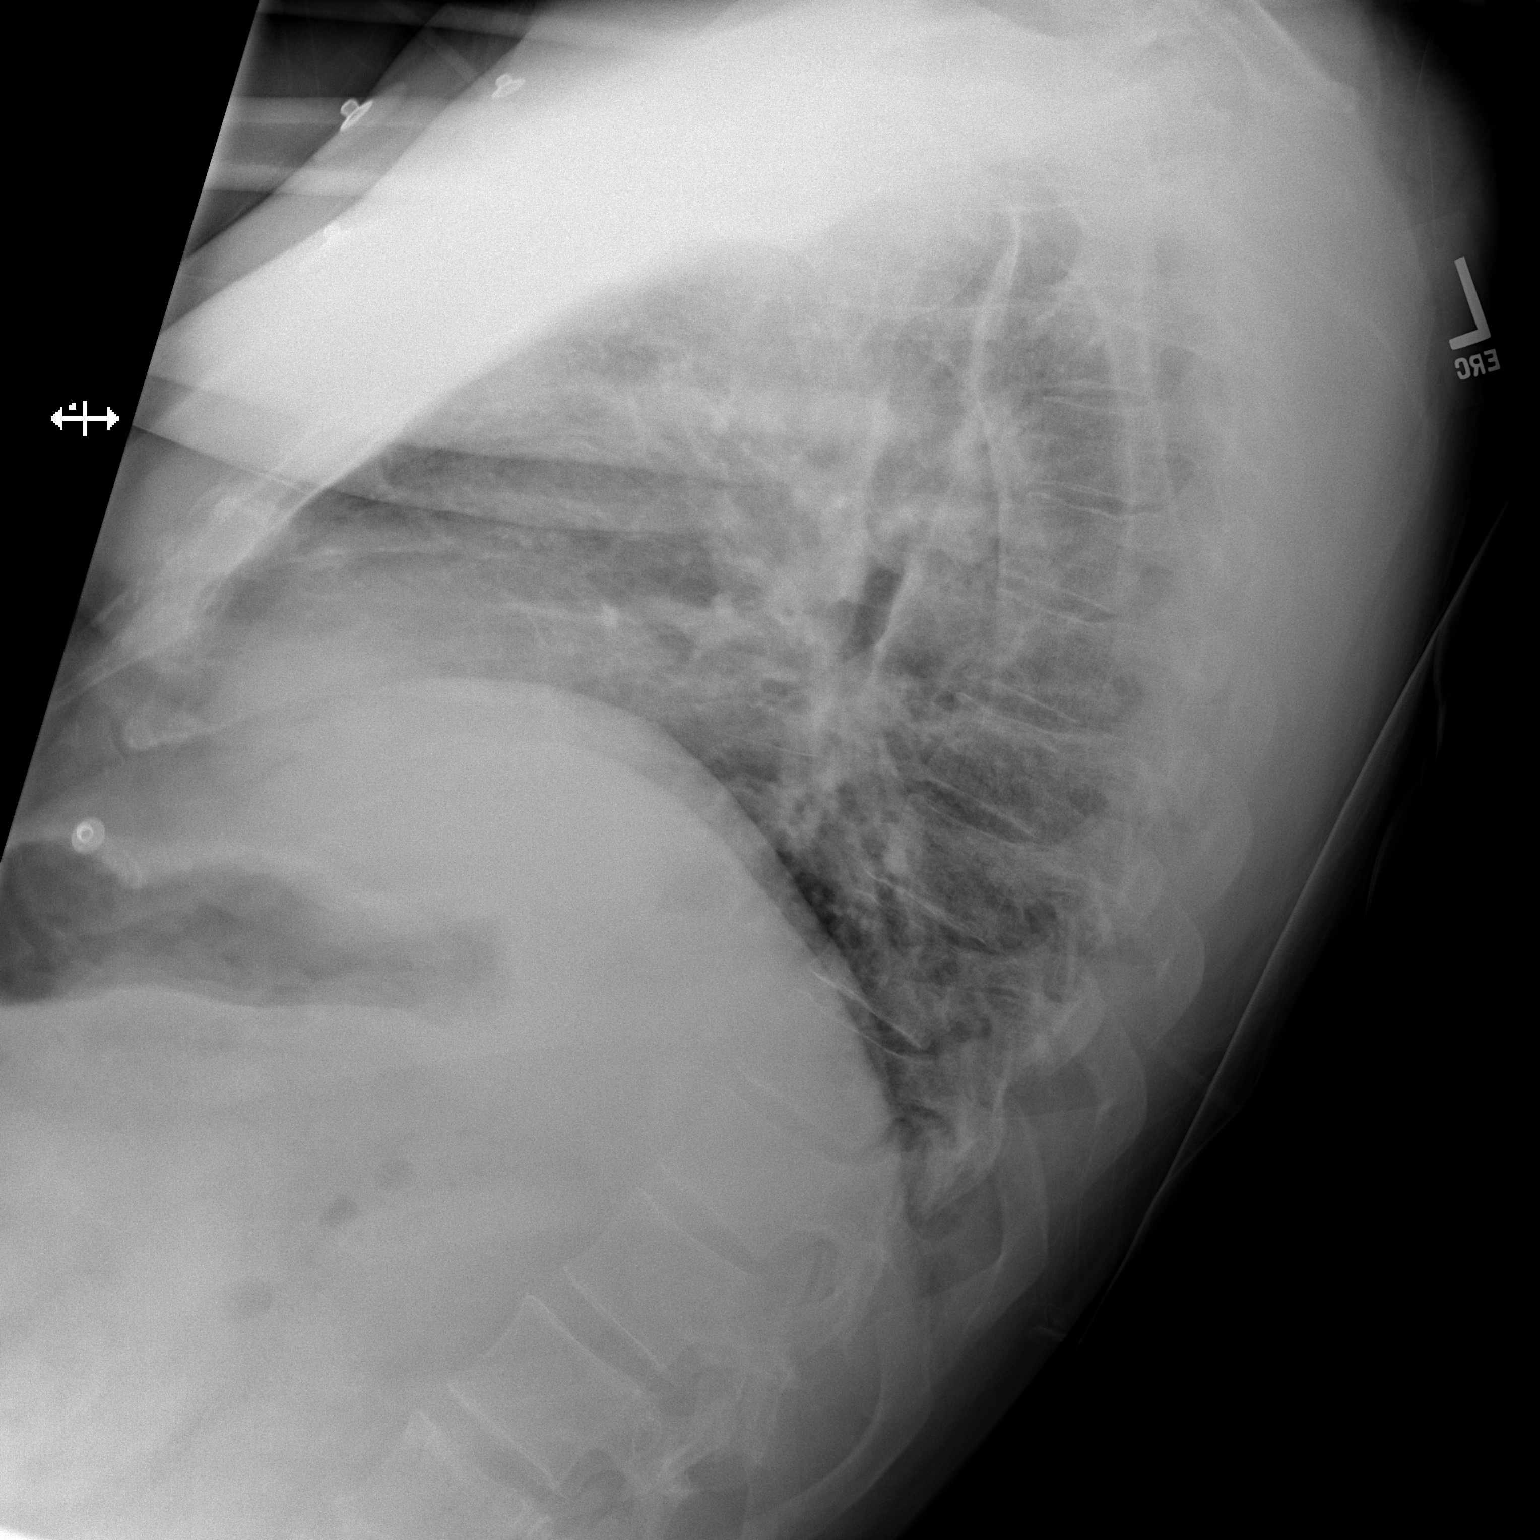

[2 of 2 positions shown; findings below may reference images not displayed]

IMPRESSION: 1. Chest radiograph without evidence of acute cardiopulmonary disease.
2. Comparison made to prior study dated [DATE].

## 2012-03-18 LAB — URINALYSIS, COMPLETE
Bacteria: NONE SEEN
Bilirubin,UR: NEGATIVE
Leukocyte Esterase: NEGATIVE
Nitrite: NEGATIVE
Ph: 5 (ref 4.5–8.0)
Squamous Epithelial: 1
WBC UR: 2 /HPF (ref 0–5)

## 2012-04-17 ENCOUNTER — Encounter: Payer: Self-pay | Admitting: Cardiovascular Disease

## 2012-04-17 ENCOUNTER — Ambulatory Visit (INDEPENDENT_AMBULATORY_CARE_PROVIDER_SITE_OTHER): Payer: Self-pay | Admitting: Cardiovascular Disease

## 2012-04-17 VITALS — BP 160/96 | HR 77 | Ht 74.0 in | Wt 229.5 lb

## 2012-04-17 DIAGNOSIS — I10 Essential (primary) hypertension: Secondary | ICD-10-CM

## 2012-04-17 DIAGNOSIS — E785 Hyperlipidemia, unspecified: Secondary | ICD-10-CM

## 2012-04-17 DIAGNOSIS — R079 Chest pain, unspecified: Secondary | ICD-10-CM

## 2012-04-17 DIAGNOSIS — E119 Type 2 diabetes mellitus without complications: Secondary | ICD-10-CM

## 2012-04-17 DIAGNOSIS — I2510 Atherosclerotic heart disease of native coronary artery without angina pectoris: Secondary | ICD-10-CM

## 2012-04-17 MED ORDER — ISOSORBIDE MONONITRATE ER 30 MG PO TB24: 30.0000 mg | ORAL_TABLET | Freq: Every day | ORAL | Status: DC

## 2012-04-17 MED ORDER — LOSARTAN POTASSIUM 100 MG PO TABS: 100.0000 mg | ORAL_TABLET | Freq: Every day | ORAL | Status: DC

## 2012-04-17 NOTE — Assessment & Plan Note (Signed)
We have recommended he asked the [facility] for insulin.

## 2012-04-17 NOTE — Assessment & Plan Note (Signed)
Continue statin. 

## 2012-04-17 NOTE — Patient Instructions (Addendum)
Blood pressure is high. Please start losartan one a day  Also start isosorbide one a day for blood pressure.   Please call us if you have new issues that need to be addressed before your next appt.  Your physician wants you to follow-up in: 3 months.  You will receive a reminder letter in the mail two months in advance. If you don't receive a letter, please call our office to schedule the follow-up appointment.

## 2012-04-17 NOTE — Progress Notes (Signed)
Patient ID: Steve Alvarado, male    DOB: 09-15-1955, 57 y.o.   MRN: [id number]  HPI Comments: Steve Alvarado is a very pleasant 57 year old gentleman with diabetes, hypertension, hyperlipidemia with recent admission to the hospital on May 16  for chest pain, cardiac catheterization showing severe lesion in his RCA also diffuse mid to distal left circumflex disease, mild proximal OM disease, moderate mid LAD disease. A bare-metal stent was placed in his right coronary artery. He presents for routine followup.    he does not have insurance. He now is getting most of [facility] medications [facility].  We changed his effient to Plavix but he reports [facility] giving [facility] effient. Lisinopril was stopped last year secondary to cough. He does not check his blood pressure at home. He is pain for insulin out-of-pocket but reports his sugars are okay. Rare episodes of chest pain, typically with exertion. Not frequent she reports there are stable. He does take nitroglycerin sometimes  Hemoglobin A1c was greater than 8 in the past  EKG shows normal sinus rhythm with rate 77 beats per minute with T-wave abnormality in V5, V6, one and aVL    Outpatient Encounter Prescriptions as of 04/17/2012  Medication Sig Dispense Refill  . amLODipine (NORVASC) 10 MG tablet Take 1 tablet (10 mg total) by mouth daily.  30 tablet  6  . aspirin 81 MG tablet Take 81 mg by mouth daily.      . insulin NPH-insulin regular (NOVOLIN 70/30) (70-30) 100 UNIT/ML injection Inject 30 Units into the skin 2 (two) times daily with a meal.        . metFORMIN (GLUCOPHAGE) 1000 MG tablet Take 1 tablet (1,000 mg total) by mouth 2 (two) times daily.  60 tablet  6  . metoprolol (LOPRESSOR) 25 MG tablet Take 1 tablet (25 mg total) by mouth 2 (two) times daily.  60 tablet  11  . nitroGLYCERIN (NITROSTAT) 0.4 MG SL tablet Place 0.4 mg under the tongue every 5 (five) minutes as needed.        . prasugrel (EFFIENT) 10 MG TABS  Take 10 mg by mouth daily.      . pravastatin (PRAVACHOL) 40 MG tablet Take 1 tablet (40 mg total) by mouth daily.  30 tablet  6    Review of Systems  Constitutional: Negative.   HENT: Negative.   Eyes: Negative.   Respiratory: Negative.   Cardiovascular: Negative.   Gastrointestinal: Negative.   Musculoskeletal: Negative.   Skin: Negative.   Neurological: Negative.   Hematological: Negative.   Psychiatric/Behavioral: Negative.   All other systems reviewed and are negative.    BP 160/96  Pulse 77  Ht 6\' 2"  (1.88 m)  Wt 229 lb 8 oz (104.101 kg)  BMI 29.47 kg/m2  Physical Exam  Nursing note and vitals reviewed. Constitutional: He is oriented to person, place, and time. He appears well-developed and well-nourished.  HENT:  Head: Normocephalic.  Nose: Nose normal.  Mouth/Throat: Oropharynx is clear and moist.  Eyes: Conjunctivae normal are normal. Pupils are equal, round, and reactive to light.  Neck: Normal range of motion. Neck supple. No JVD present.  Cardiovascular: Normal rate, regular rhythm, S1 normal, S2 normal, normal heart sounds and intact distal pulses.  Exam reveals no gallop and no friction rub.   No murmur heard. Pulmonary/Chest: Effort normal and breath sounds normal. No respiratory distress. He has no wheezes. He has no rales. He exhibits no tenderness.  Abdominal:  Soft. Bowel sounds are normal. He exhibits no distension. There is no tenderness.  Musculoskeletal: Normal range of motion. He exhibits no edema and no tenderness.  Lymphadenopathy:    He has no cervical adenopathy.  Neurological: He is alert and oriented to person, place, and time. Coordination normal.  Skin: Skin is warm and dry. No rash noted. No erythema.  Psychiatric: He has a normal mood and affect. His behavior is normal. Judgment and thought content normal.           Assessment and Plan

## 2012-04-17 NOTE — Assessment & Plan Note (Signed)
Three-vessel disease, prior stent placement. Continue on antiplatelet medication. Periodic chest pain with exertion. By his report is stable. We've asked him to call our office if symptoms get more frequent or more intense. We'll start isosorbide 30 mg today

## 2012-04-17 NOTE — Assessment & Plan Note (Signed)
Blood pressure is very high today. We will add losartan 100 mg daily, isosorbide 30 mg daily. Isosorbide can be increased in the future if needed for hypertension. Continue amlodipine and metoprolol.

## 2012-07-20 ENCOUNTER — Encounter: Payer: Self-pay | Admitting: Cardiovascular Disease

## 2012-07-20 ENCOUNTER — Ambulatory Visit (INDEPENDENT_AMBULATORY_CARE_PROVIDER_SITE_OTHER): Payer: Self-pay | Admitting: Cardiovascular Disease

## 2012-07-20 VITALS — BP 148/82 | HR 78 | Ht 74.0 in | Wt 238.0 lb

## 2012-07-20 DIAGNOSIS — I2510 Atherosclerotic heart disease of native coronary artery without angina pectoris: Secondary | ICD-10-CM

## 2012-07-20 DIAGNOSIS — E785 Hyperlipidemia, unspecified: Secondary | ICD-10-CM

## 2012-07-20 DIAGNOSIS — E119 Type 2 diabetes mellitus without complications: Secondary | ICD-10-CM

## 2012-07-20 DIAGNOSIS — I10 Essential (primary) hypertension: Secondary | ICD-10-CM

## 2012-07-20 MED ORDER — CLOPIDOGREL BISULFATE 75 MG PO TABS: 75.0000 mg | ORAL_TABLET | Freq: Every day | ORAL | Status: DC

## 2012-07-20 MED ORDER — ISOSORBIDE MONONITRATE ER 30 MG PO TB24: 30.0000 mg | ORAL_TABLET | Freq: Two times a day (BID) | ORAL | Status: DC

## 2012-07-20 MED ORDER — METOPROLOL TARTRATE 50 MG PO TABS: 50.0000 mg | ORAL_TABLET | Freq: Two times a day (BID) | ORAL | Status: DC

## 2012-07-20 NOTE — Assessment & Plan Note (Signed)
We have suggested he stay on his statin. Cholesterol should improve with better diabetes control.

## 2012-07-20 NOTE — Assessment & Plan Note (Signed)
Currently with no symptoms of angina. No further workup at this time. Continue current medication regimen. 

## 2012-07-20 NOTE — Patient Instructions (Addendum)
You are doing well.  When your effient runs out, change to plavix once a day  We will increase the metoprolol to 50 mg twice a day (up from 25 mg twice a day) for blood pressure  We will also increase the isosorbide to twice a day if needed for high blood pressure   Please call us if you have new issues that need to be addressed before your next appt.  Your physician wants you to follow-up in: 6 months.  You will receive a reminder letter in the mail two months in advance. If you don't receive a letter, please call our office to schedule the follow-up appointment.

## 2012-07-20 NOTE — Progress Notes (Signed)
Patient ID: Steve Alvarado, male    DOB: 06-Sep-1955, 57 y.o.   MRN: [id number]  HPI Comments: Steve Alvarado is a very pleasant 57 year old gentleman with diabetes, hypertension, hyperlipidemia with  admission to the hospital on Aug 04 2011 for chest pain, cardiac catheterization showing severe lesion in his RCA also diffuse mid to distal left circumflex disease, mild proximal OM disease, moderate mid LAD disease. A bare-metal stent was placed in his right coronary artery. He presents for routine followup.    he does not have insurance. He now is getting most of [facility].  We previously changed his effient to Plavix but [facility] reports [facility] effient. Lisinopril was stopped  secondary to cough. He does not check his blood pressure at home as he does not have a blood pressure cuff.  No significant chest pain episodes. No significant shortness of breath. Overall stable  Hemoglobin A1c was greater than 8 in the past  EKG shows normal sinus rhythm with rate 78 beats per minute with T-wave abnormality in V5, V6, one and aVL    Outpatient Encounter Prescriptions as of 07/20/2012  Medication Sig Dispense Refill  . amLODipine (NORVASC) 10 MG tablet Take 1 tablet (10 mg total) by mouth daily.  30 tablet  6  . aspirin 81 MG tablet Take 81 mg by mouth daily.      . insulin NPH-insulin regular (NOVOLIN 70/30) (70-30) 100 UNIT/ML injection Inject 30 Units into the skin 2 (two) times daily with a meal.        . isosorbide mononitrate (IMDUR) 30 MG 24 hr tablet Take 1 tablet (30 mg total) by mouth 2 (two) times daily.  60 tablet  6  . losartan (COZAAR) 100 MG tablet Take 1 tablet (100 mg total) by mouth daily.  90 tablet  3  . metFORMIN (GLUCOPHAGE) 1000 MG tablet Take 1 tablet (1,000 mg total) by mouth 2 (two) times daily.  60 tablet  6  . metoprolol tartrate (LOPRESSOR) 50 MG tablet Take 1 tablet (50 mg total) by mouth 2 (two) times daily.  60  tablet  11  . nitroGLYCERIN (NITROSTAT) 0.4 MG SL tablet Place 0.4 mg under the tongue every 5 (five) minutes as needed.        . prasugrel (EFFIENT) 10 MG TABS Take 10 mg by mouth daily.      . pravastatin (PRAVACHOL) 40 MG tablet Take 1 tablet (40 mg total) by mouth daily.  30 tablet  6    Review of Systems  Constitutional: Negative.   HENT: Negative.   Eyes: Negative.   Respiratory: Negative.   Cardiovascular: Negative.   Gastrointestinal: Negative.   Musculoskeletal: Negative.   Skin: Negative.   Neurological: Negative.   Psychiatric/Behavioral: Negative.   All other systems reviewed and are negative.    BP 148/82  Pulse 78  Ht 6\' 2"  (1.88 m)  Wt 238 lb (107.956 kg)  BMI 30.54 kg/m2  Physical Exam  Nursing note and vitals reviewed. Constitutional: He is oriented to person, place, and time. He appears well-developed and well-nourished.  HENT:  Head: Normocephalic.  Nose: Nose normal.  Mouth/Throat: Oropharynx is clear and moist.  Eyes: Conjunctivae are normal. Pupils are equal, round, and reactive to light.  Neck: Normal range of motion. Neck supple. No JVD present.  Cardiovascular: Normal rate, regular rhythm, S1 normal, S2 normal, normal heart sounds and intact distal pulses.  Exam reveals no gallop  and no friction rub.   No murmur heard. Pulmonary/Chest: Effort normal and breath sounds normal. No respiratory distress. He has no wheezes. He has no rales. He exhibits no tenderness.  Abdominal: Soft. Bowel sounds are normal. He exhibits no distension. There is no tenderness.  Musculoskeletal: Normal range of motion. He exhibits no edema and no tenderness.  Lymphadenopathy:    He has no cervical adenopathy.  Neurological: He is alert and oriented to person, place, and time. Coordination normal.  Skin: Skin is warm and dry. No rash noted. No erythema.  Psychiatric: He has a normal mood and affect. His behavior is normal. Judgment and thought content normal.       Assessment and Plan

## 2012-07-20 NOTE — Assessment & Plan Note (Addendum)
Blood pressures running high. We have suggested he increase his metoprolol to 50 mg twice a day, increase isosorbide to 30 mg twice a day.

## 2012-07-20 NOTE — Assessment & Plan Note (Signed)
We have encouraged continued exercise, careful diet management in an effort to lose weight. 

## 2012-11-27 ENCOUNTER — Encounter: Payer: Self-pay | Admitting: Cardiovascular Disease

## 2012-11-27 ENCOUNTER — Ambulatory Visit (INDEPENDENT_AMBULATORY_CARE_PROVIDER_SITE_OTHER): Payer: Self-pay | Admitting: Cardiovascular Disease

## 2012-11-27 VITALS — BP 152/82 | HR 82 | Ht 74.0 in | Wt 237.8 lb

## 2012-11-27 DIAGNOSIS — I10 Essential (primary) hypertension: Secondary | ICD-10-CM

## 2012-11-27 DIAGNOSIS — E785 Hyperlipidemia, unspecified: Secondary | ICD-10-CM

## 2012-11-27 DIAGNOSIS — R0789 Other chest pain: Secondary | ICD-10-CM

## 2012-11-27 DIAGNOSIS — Z9861 Coronary angioplasty status: Secondary | ICD-10-CM

## 2012-11-27 DIAGNOSIS — Z955 Presence of coronary angioplasty implant and graft: Secondary | ICD-10-CM

## 2012-11-27 DIAGNOSIS — I2510 Atherosclerotic heart disease of native coronary artery without angina pectoris: Secondary | ICD-10-CM

## 2012-11-27 DIAGNOSIS — E119 Type 2 diabetes mellitus without complications: Secondary | ICD-10-CM

## 2012-11-27 MED ORDER — CLOPIDOGREL BISULFATE 75 MG PO TABS: 75.0000 mg | ORAL_TABLET | Freq: Every day | ORAL | Status: DC

## 2012-11-27 MED ORDER — CLONIDINE HCL 0.1 MG PO TABS: 0.1000 mg | ORAL_TABLET | Freq: Two times a day (BID) | ORAL | Status: DC

## 2012-11-27 NOTE — Assessment & Plan Note (Signed)
Currently with no symptoms of angina. No further workup at this time. Continue current medication regimen. 

## 2012-11-27 NOTE — Progress Notes (Signed)
Patient ID: Steve Alvarado, male    DOB: [birth year], 57 y.o.   MRN: [id number]  HPI Comments: Mr. Chio is a very pleasant 57 year old gentleman with diabetes, hypertension, hyperlipidemia with  admission to the hospital on Aug 04 2011 for chest pain, cardiac catheterization showing severe lesion in his RCA also diffuse mid to distal left circumflex disease, mild proximal OM disease, moderate mid LAD disease. A bare-metal stent was placed in his right coronary artery. He presents for routine followup.   He reports that he is receiving his medication from [facility]. 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 If he has shortness of breath or fatigue, he typically sits down, catches his breath and is able to continue working. He is unable to run around and "play ball" like he used to since the heart attack. Denies any lower extremity edema. Feels his weight is approximately the same. Denies any recent chest pain.Overall stable  He reports that his blood pressure is typically running high  Hemoglobin A1c was greater than 8 in the past  EKG shows normal sinus rhythm with rate 82 beats per minute with T-wave abnormality in V5, V6, one and aVL    Outpatient Encounter Prescriptions as of 11/27/2012  Medication Sig Dispense Refill  . amLODipine (NORVASC) 10 MG tablet Take 1 tablet (10 mg total) by mouth daily.  30 tablet  6  . aspirin 81 MG tablet Take 81 mg by mouth daily.      . insulin NPH-insulin regular (NOVOLIN 70/30) (70-30) 100 UNIT/ML injection Inject 30 Units into the skin 2 (two) times daily with a meal.        . isosorbide mononitrate (IMDUR) 30 MG 24 hr tablet Take 1 tablet (30 mg total) by mouth 2 (two) times daily.  60 tablet  6  . losartan (COZAAR) 100 MG tablet Take 1 tablet (100 mg total) by mouth daily.  90 tablet  3  . metFORMIN (GLUCOPHAGE) 1000 MG tablet Take 1 tablet (1,000 mg total) by mouth 2 (two) times daily.  60 tablet  6  . metoprolol  tartrate (LOPRESSOR) 50 MG tablet Take 1 tablet (50 mg total) by mouth 2 (two) times daily.  60 tablet  11  . nitroGLYCERIN (NITROSTAT) 0.4 MG SL tablet Place 0.4 mg under the tongue every 5 (five) minutes as needed.        . prasugrel (EFFIENT) 10 MG TABS Take 10 mg by mouth daily.      . pravastatin (PRAVACHOL) 40 MG tablet Take 1 tablet (40 mg total) by mouth daily.  30 tablet  6   No facility-administered encounter medications on file as of 11/27/2012.     Review of Systems  Constitutional: Negative.   HENT: Negative.   Eyes: Negative.   Respiratory: Negative.   Cardiovascular: Negative.   Gastrointestinal: Negative.   Musculoskeletal: Negative.   Skin: Negative.   Neurological: Negative.   Psychiatric/Behavioral: Negative.   All other systems reviewed and are negative.    BP 152/82  Pulse 82  Ht 6\' 2"  (1.88 m)  Wt 237 lb 12 oz (107.843 kg)  BMI 30.51 kg/m2  Physical Exam  Nursing note and vitals reviewed. Constitutional: He is oriented to person, place, and time. He appears well-developed and well-nourished.  HENT:  Head: Normocephalic.  Nose: Nose normal.  Mouth/Throat: Oropharynx is clear and moist.  Eyes: Conjunctivae are normal. Pupils are equal, round, and reactive to light.  Neck: Normal range  of motion. Neck supple. No JVD present.  Cardiovascular: Normal rate, regular rhythm, S1 normal, S2 normal, normal heart sounds and intact distal pulses.  Exam reveals no gallop and no friction rub.   No murmur heard. Pulmonary/Chest: Effort normal and breath sounds normal. No respiratory distress. He has no wheezes. He has no rales. He exhibits no tenderness.  Abdominal: Soft. Bowel sounds are normal. He exhibits no distension. There is no tenderness.  Musculoskeletal: Normal range of motion. He exhibits no edema and no tenderness.  Lymphadenopathy:    He has no cervical adenopathy.  Neurological: He is alert and oriented to person, place, and time. Coordination normal.   Skin: Skin is warm and dry. No rash noted. No erythema.  Psychiatric: He has a normal mood and affect. His behavior is normal. Judgment and thought content normal.      Assessment and Plan

## 2012-11-27 NOTE — Assessment & Plan Note (Signed)
Blood pressure is elevated. We'll start him on clonidine 0.1 mg twice a day

## 2012-11-27 NOTE — Assessment & Plan Note (Signed)
Continue current dose of statin. Goal LDL less than 70

## 2012-11-27 NOTE — Assessment & Plan Note (Signed)
We have encouraged continued exercise, careful diet management in an effort to lose weight. 

## 2012-11-27 NOTE — Assessment & Plan Note (Signed)
We have suggested once he runs out of effient, he changed to Plavix 75 mg daily. He reports that he has possibly several weeks or months left of effient.

## 2012-11-27 NOTE — Patient Instructions (Addendum)
You are doing well.  When you run out of effient, Change to plavix with aspirin  Please start clonidine 0.1 mg twice a day  Please call us if you have new issues that need to be addressed before your next appt.  Your physician wants you to follow-up in: 6 months.  You will receive a reminder letter in the mail two months in advance. If you don't receive a letter, please call our office to schedule the follow-up appointment.

## 2012-12-19 ENCOUNTER — Ambulatory Visit: Payer: Self-pay

## 2012-12-19 NOTE — Progress Notes (Signed)
Pt presented today asking me to go over his medications, as he feels that he is taking too many bp medications.  States "when I pee, it is a straight medicine smell" and makes his "penis burn". States that he has been having regular chest tightness, but does not want to "waste" his nitro pills. States that he is currently feeling "a bit nauseous" but admits that he has not eaten today or checked his blood sugar.  Pt is currently getting his medication through the [facility] and states that he does not have a PCP. Went through medications and explained the purpose of each.  Explained the importance of having a PCP to manage his diabetes and medications.  Gave info on the [facility], as pt lives nearby.  BP in office today 124/82, pt states that it has "never" been that low. Educated pt on effects of htn and dm on the body and stressed the importance of managing both. Pt and wife state that they will find a PCP to manage his various health issues.

## 2013-01-16 ENCOUNTER — Ambulatory Visit (INDEPENDENT_AMBULATORY_CARE_PROVIDER_SITE_OTHER): Payer: Self-pay | Admitting: Cardiovascular Disease

## 2013-01-16 ENCOUNTER — Encounter: Payer: Self-pay | Admitting: Cardiovascular Disease

## 2013-01-16 VITALS — BP 150/88 | HR 74 | Ht 74.0 in | Wt 245.0 lb

## 2013-01-16 DIAGNOSIS — E785 Hyperlipidemia, unspecified: Secondary | ICD-10-CM

## 2013-01-16 DIAGNOSIS — I10 Essential (primary) hypertension: Secondary | ICD-10-CM

## 2013-01-16 DIAGNOSIS — E119 Type 2 diabetes mellitus without complications: Secondary | ICD-10-CM

## 2013-01-16 DIAGNOSIS — I2510 Atherosclerotic heart disease of native coronary artery without angina pectoris: Secondary | ICD-10-CM

## 2013-01-16 NOTE — Assessment & Plan Note (Signed)
We have encouraged continued exercise, careful diet management in an effort to lose weight. 

## 2013-01-16 NOTE — Assessment & Plan Note (Signed)
Recently he received a prescription for higher dose clonidine 0.2 mg twice a day. He has not started this yet. Suggested he monitor his blood pressure closely.

## 2013-01-16 NOTE — Assessment & Plan Note (Signed)
Currently with no symptoms of angina. No further workup at this time. Continue current medication regimen. 

## 2013-01-16 NOTE — Progress Notes (Signed)
Patient ID: Steve Alvarado, male    DOB: [birth year], 57 y.o.   MRN: [id number]  HPI Comments: Steve Alvarado is a very pleasant 57 year old gentleman with diabetes, hypertension, hyperlipidemia with  admission to the hospital on Aug 04 2011 for chest pain, cardiac catheterization showing severe lesion in his RCA, severe mid LAD disease, diffuse mid to distal left circumflex disease of a small to moderate sized vessel, mild proximal OM disease,A bare-metal stent was placed to his RCA and LAD, per the procedure note. He presents for routine followup.   Poorly controlled diabetes in the past  He reports that [facility] is [facility].   In general he reports that he is doing well. He is working hard, active, has no complaints of chest pain or shortness of breath. Tolerating his medications well. Blood pressure has been running high and her care physician recently increased clonidine up to 0.2 mg twice a day. Also reports that he is taking insulin and working on his diabetes .   Denies any lower extremity edema. Weight has climbed 16 pounds  EKG shows normal sinus rhythm with rate 74 beats per minute with T-wave abnormality in V5, V6, one and aVL    Outpatient Encounter Prescriptions as of 01/16/2013  Medication Sig Dispense Refill  . amLODipine (NORVASC) 10 MG tablet Take 1 tablet (10 mg total) by mouth daily.  30 tablet  6  . aspirin 81 MG tablet Take 81 mg by mouth daily.      . cloNIDine (CATAPRES) 0.1 MG tablet Take 1 tablet (0.1 mg total) by mouth 2 (two) times daily.  60 tablet  11  . clopidogrel (PLAVIX) 75 MG tablet Take 1 tablet (75 mg total) by mouth daily.  30 tablet  11  . insulin NPH-insulin regular (NOVOLIN 70/30) (70-30) 100 UNIT/ML injection Inject 30 Units into the skin 2 (two) times daily with a meal.        . isosorbide mononitrate (IMDUR) 30 MG 24 hr tablet Take 1 tablet (30 mg total) by mouth 2 (two) times daily.  60 tablet  6  . losartan (COZAAR)  100 MG tablet Take 1 tablet (100 mg total) by mouth daily.  90 tablet  3  . metFORMIN (GLUCOPHAGE) 1000 MG tablet Take 1 tablet (1,000 mg total) by mouth 2 (two) times daily.  60 tablet  6  . metoprolol tartrate (LOPRESSOR) 50 MG tablet Take 1 tablet (50 mg total) by mouth 2 (two) times daily.  60 tablet  11  . nitroGLYCERIN (NITROSTAT) 0.4 MG SL tablet Place 0.4 mg under the tongue every 5 (five) minutes as needed.        . prasugrel (EFFIENT) 10 MG TABS Take 10 mg by mouth daily.      . pravastatin (PRAVACHOL) 40 MG tablet Take 1 tablet (40 mg total) by mouth daily.  30 tablet  6   No facility-administered encounter medications on file as of 01/16/2013.     Review of Systems  Constitutional: Negative.   HENT: Negative.   Eyes: Negative.   Respiratory: Negative.   Cardiovascular: Negative.   Gastrointestinal: Negative.   Endocrine: Negative.   Musculoskeletal: Negative.   Skin: Negative.   Allergic/Immunologic: Negative.   Neurological: Negative.   Hematological: Negative.   Psychiatric/Behavioral: Negative.   All other systems reviewed and are negative.    BP 150/88  Pulse 74  Ht 6\' 2"  (1.88 m)  Wt 245 lb (111.131 kg)  BMI 31.44  kg/m2  Physical Exam  Nursing note and vitals reviewed. Constitutional: He is oriented to person, place, and time. He appears well-developed and well-nourished.  HENT:  Head: Normocephalic.  Nose: Nose normal.  Mouth/Throat: Oropharynx is clear and moist.  Eyes: Conjunctivae are normal. Pupils are equal, round, and reactive to light.  Neck: Normal range of motion. Neck supple. No JVD present.  Cardiovascular: Normal rate, regular rhythm, S1 normal, S2 normal, normal heart sounds and intact distal pulses.  Exam reveals no gallop and no friction rub.   No murmur heard. Pulmonary/Chest: Effort normal and breath sounds normal. No respiratory distress. He has no wheezes. He has no rales. He exhibits no tenderness.  Abdominal: Soft. Bowel sounds are  normal. He exhibits no distension. There is no tenderness.  Musculoskeletal: Normal range of motion. He exhibits no edema and no tenderness.  Lymphadenopathy:    He has no cervical adenopathy.  Neurological: He is alert and oriented to person, place, and time. Coordination normal.  Skin: Skin is warm and dry. No rash noted. No erythema.  Psychiatric: He has a normal mood and affect. His behavior is normal. Judgment and thought content normal.      Assessment and Plan

## 2013-01-16 NOTE — Patient Instructions (Addendum)
You are doing well. No medication changes were made.  Please call us if you have new issues that need to be addressed before your next appt.  Your physician wants you to follow-up in: 12 months.  You will receive a reminder letter in the mail two months in advance. If you don't receive a letter, please call our office to schedule the follow-up appointment. 

## 2013-01-16 NOTE — Assessment & Plan Note (Signed)
Encouraged him to stay on his pravastatin Goal LDL less than 70 

## 2013-04-24 ENCOUNTER — Other Ambulatory Visit: Payer: Self-pay | Admitting: Cardiovascular Disease

## 2013-07-01 ENCOUNTER — Emergency Department: Payer: Self-pay | Admitting: Emergency Medicine

## 2013-07-01 LAB — URINALYSIS, COMPLETE
Bilirubin,UR: NEGATIVE
Blood: NEGATIVE
Glucose,UR: 500 mg/dL (ref 0–75)
Ketone: NEGATIVE
LEUKOCYTE ESTERASE: NEGATIVE
Nitrite: NEGATIVE
Ph: 6 (ref 4.5–8.0)
Protein: NEGATIVE
RBC,UR: NONE SEEN /HPF (ref 0–5)
Specific Gravity: 1.015 (ref 1.003–1.030)

## 2013-07-01 LAB — COMPREHENSIVE METABOLIC PANEL
ALK PHOS: 75 U/L
ANION GAP: 7 (ref 7–16)
Albumin: 3.8 g/dL (ref 3.4–5.0)
BUN: 25 mg/dL — AB (ref 7–18)
Bilirubin,Total: 0.4 mg/dL (ref 0.2–1.0)
CALCIUM: 9.7 mg/dL (ref 8.5–10.1)
Chloride: 97 mmol/L — ABNORMAL LOW (ref 98–107)
Co2: 26 mmol/L (ref 21–32)
Creatinine: 1.6 mg/dL — ABNORMAL HIGH (ref 0.60–1.30)
EGFR (African American): 54 — ABNORMAL LOW
EGFR (Non-African Amer.): 46 — ABNORMAL LOW
Glucose: 486 mg/dL — ABNORMAL HIGH (ref 65–99)
Osmolality: 287 (ref 275–301)
POTASSIUM: 4.4 mmol/L (ref 3.5–5.1)
SGOT(AST): 35 U/L (ref 15–37)
SGPT (ALT): 55 U/L (ref 12–78)
Sodium: 130 mmol/L — ABNORMAL LOW (ref 136–145)
Total Protein: 9 g/dL — ABNORMAL HIGH (ref 6.4–8.2)

## 2013-07-01 LAB — CBC
HCT: 48.3 % (ref 40.0–52.0)
HGB: 16 g/dL (ref 13.0–18.0)
MCH: 29.8 pg (ref 26.0–34.0)
MCHC: 33.1 g/dL (ref 32.0–36.0)
MCV: 90 fL (ref 80–100)
Platelet: 328 10*3/uL (ref 150–440)
RBC: 5.37 10*6/uL (ref 4.40–5.90)
RDW: 13.3 % (ref 11.5–14.5)
WBC: 8.6 10*3/uL (ref 3.8–10.6)

## 2013-07-03 ENCOUNTER — Inpatient Hospital Stay: Payer: Self-pay | Admitting: Student

## 2013-07-03 DIAGNOSIS — I498 Other specified cardiac arrhythmias: Secondary | ICD-10-CM

## 2013-07-03 DIAGNOSIS — I10 Essential (primary) hypertension: Secondary | ICD-10-CM

## 2013-07-03 DIAGNOSIS — I2510 Atherosclerotic heart disease of native coronary artery without angina pectoris: Secondary | ICD-10-CM

## 2013-07-03 DIAGNOSIS — I059 Rheumatic mitral valve disease, unspecified: Secondary | ICD-10-CM

## 2013-07-03 LAB — COMPREHENSIVE METABOLIC PANEL
ANION GAP: 10 (ref 7–16)
Albumin: 3.5 g/dL (ref 3.4–5.0)
Alkaline Phosphatase: 69 U/L
BUN: 19 mg/dL — ABNORMAL HIGH (ref 7–18)
Bilirubin,Total: 0.5 mg/dL (ref 0.2–1.0)
CO2: 21 mmol/L (ref 21–32)
Calcium, Total: 9.4 mg/dL (ref 8.5–10.1)
Chloride: 101 mmol/L (ref 98–107)
Creatinine: 1.06 mg/dL (ref 0.60–1.30)
EGFR (African American): >60
EGFR (Non-African Amer.): >60
Glucose: 403 mg/dL — ABNORMAL HIGH (ref 65–99)
OSMOLALITY: 284 (ref 275–301)
Potassium: 4.9 mmol/L (ref 3.5–5.1)
SGOT(AST): 93 U/L — ABNORMAL HIGH (ref 15–37)
SGPT (ALT): 86 U/L — ABNORMAL HIGH (ref 12–78)
Sodium: 132 mmol/L — ABNORMAL LOW (ref 136–145)
Total Protein: 8.4 g/dL — ABNORMAL HIGH (ref 6.4–8.2)

## 2013-07-03 LAB — CBC
HCT: 44.4 % (ref 40.0–52.0)
HGB: 14.7 g/dL (ref 13.0–18.0)
MCH: 29.5 pg (ref 26.0–34.0)
MCHC: 33.2 g/dL (ref 32.0–36.0)
MCV: 89 fL (ref 80–100)
Platelet: 281 10*3/uL (ref 150–440)
RBC: 4.99 10*6/uL (ref 4.40–5.90)
RDW: 13.2 % (ref 11.5–14.5)
WBC: 10.6 10*3/uL (ref 3.8–10.6)

## 2013-07-03 LAB — APTT
ACTIVATED PTT: 25.2 s (ref 23.6–35.9)
ACTIVATED PTT: 48.5 s — AB (ref 23.6–35.9)

## 2013-07-03 LAB — MAGNESIUM: MAGNESIUM: 2 mg/dL

## 2013-07-03 LAB — TROPONIN I
TROPONIN-I: 3.5 ng/mL — AB
Troponin-I: <0.02 ng/mL
Troponin-I: 6.7 ng/mL — ABNORMAL HIGH

## 2013-07-03 LAB — HEMOGLOBIN A1C: Hemoglobin A1C: 11 % — ABNORMAL HIGH (ref 4.2–6.3)

## 2013-07-03 LAB — CK-MB
CK-MB: 4.4 ng/mL — ABNORMAL HIGH (ref 0.5–3.6)
CK-MB: 18.1 ng/mL — ABNORMAL HIGH (ref 0.5–3.6)
CK-MB: 15.8 ng/mL — ABNORMAL HIGH (ref 0.5–3.6)

## 2013-07-03 LAB — TSH: Thyroid Stimulating Horm: 4.94 u[IU]/mL — ABNORMAL HIGH

## 2013-07-03 LAB — PROTIME-INR
INR: 0.9
PROTHROMBIN TIME: 11.7 s (ref 11.5–14.7)

## 2013-07-03 IMAGING — CR DG CHEST 1V PORT
1 series · 1 of 1 positions shown · non-contrast
Comparison: DG CHEST 2V dated [DATE]

CLINICAL DATA: Left-sided chest and arm discomfort with
hypertension

EXAM:
PORTABLE CHEST - 1 VIEW

[ap]
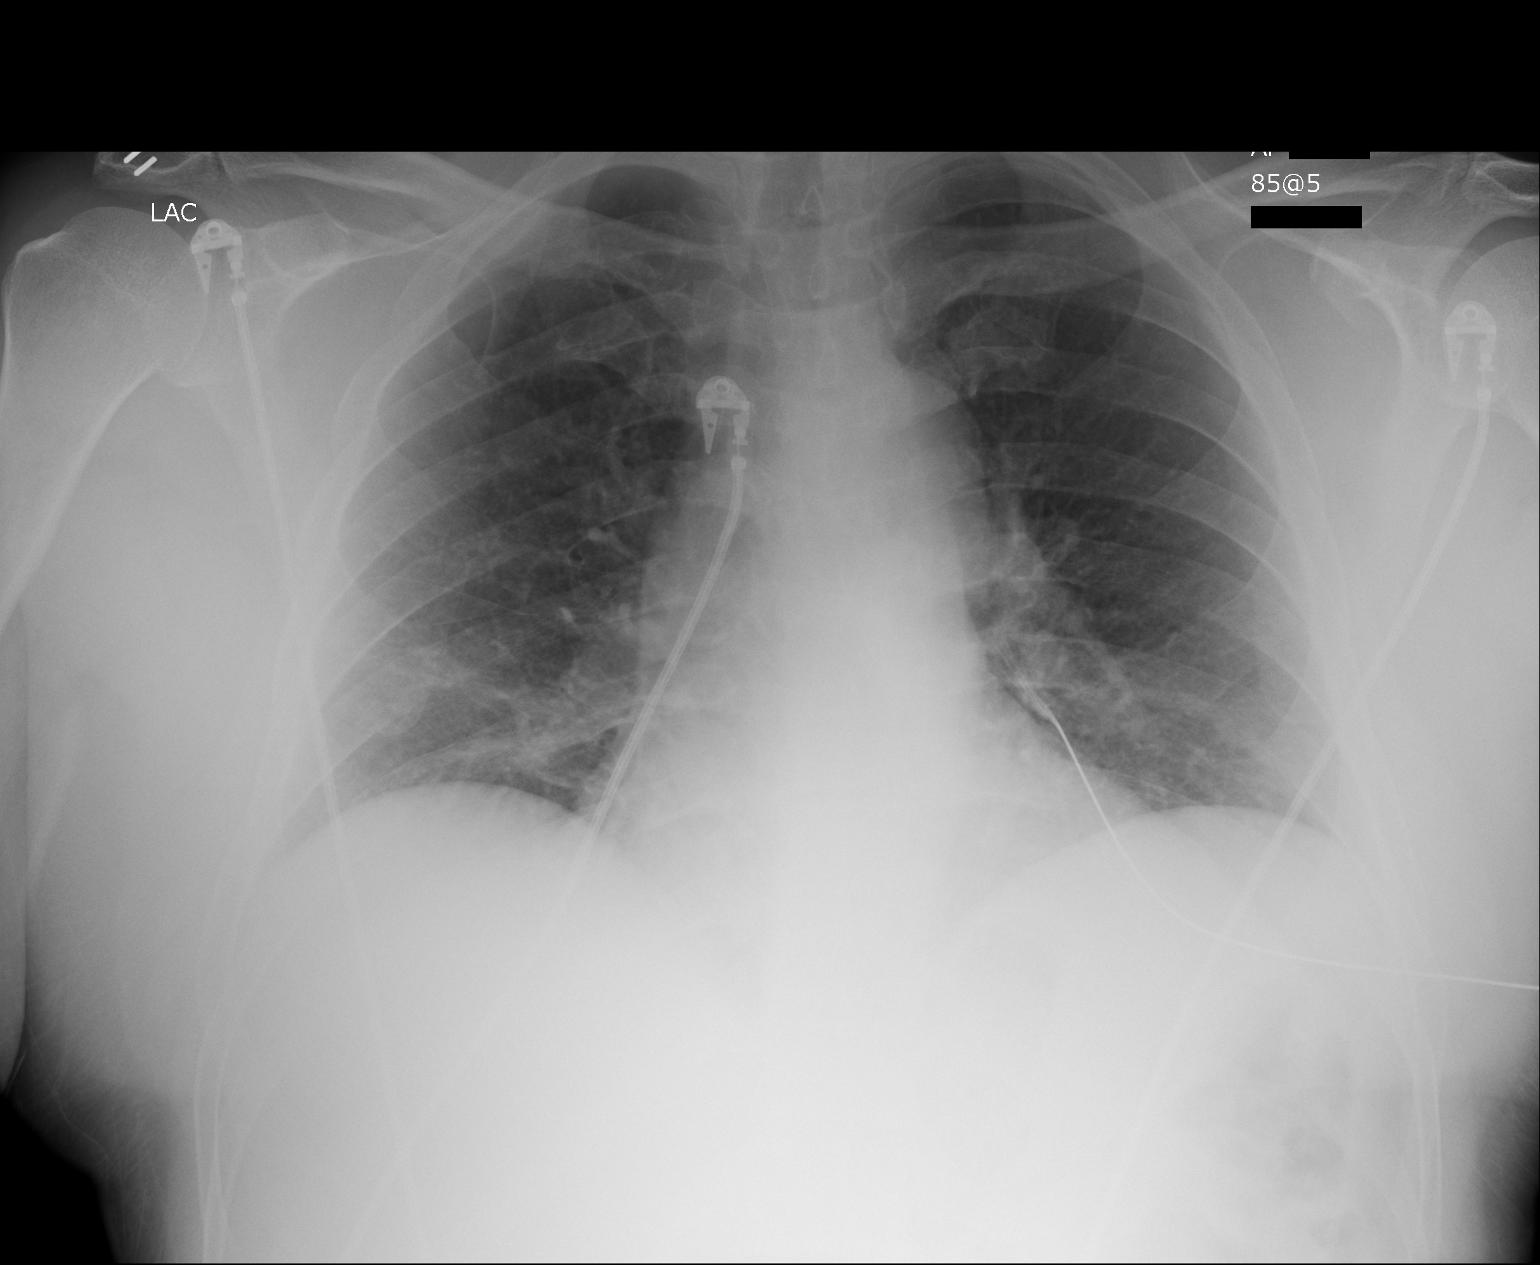

[1 of 1 positions shown; findings below may reference images not displayed]

FINDINGS: The lungs are adequately inflated. Increased conspicuity of the lung
density in the infrahilar region on the right suggests atelectasis.
The cardiopericardial silhouette is mildly enlarged though stable.
The pulmonary vascularity is not engorged. There is no pleural
effusion or pneumothorax.
IMPRESSION: Mildly increased right infrahilar lung markings likely reflects
subsegmental atelectasis. There is no evidence of CHF. A followup PA
and lateral chest x-ray would be of value.

## 2013-07-04 DIAGNOSIS — I2510 Atherosclerotic heart disease of native coronary artery without angina pectoris: Secondary | ICD-10-CM

## 2013-07-04 DIAGNOSIS — I10 Essential (primary) hypertension: Secondary | ICD-10-CM

## 2013-07-04 LAB — APTT: Activated PTT: 57.1 secs — ABNORMAL HIGH (ref 23.6–35.9)

## 2013-07-10 ENCOUNTER — Ambulatory Visit: Payer: Self-pay | Admitting: Cardiovascular Disease

## 2013-07-12 ENCOUNTER — Ambulatory Visit: Payer: Self-pay | Admitting: Cardiovascular Disease

## 2013-07-17 LAB — DRUG SCREEN, URINE
Amphetamines, Ur Screen: NEGATIVE (ref ?–1000)
BENZODIAZEPINE, UR SCRN: NEGATIVE (ref ?–200)
Barbiturates, Ur Screen: NEGATIVE (ref ?–200)
CANNABINOID 50 NG, UR ~~LOC~~: NEGATIVE (ref ?–50)
Cocaine Metabolite,Ur ~~LOC~~: POSITIVE (ref ?–300)
MDMA (ECSTASY) UR SCREEN: NEGATIVE (ref ?–500)
Methadone, Ur Screen: NEGATIVE (ref ?–300)
Opiate, Ur Screen: NEGATIVE (ref ?–300)
PHENCYCLIDINE (PCP) UR S: NEGATIVE (ref ?–25)
TRICYCLIC, UR SCREEN: NEGATIVE (ref ?–1000)

## 2013-07-17 LAB — COMPREHENSIVE METABOLIC PANEL
ALBUMIN: 3.7 g/dL (ref 3.4–5.0)
ALT: 64 U/L (ref 12–78)
Alkaline Phosphatase: 58 U/L
Anion Gap: 8 (ref 7–16)
BUN: 17 mg/dL (ref 7–18)
Bilirubin,Total: 0.7 mg/dL (ref 0.2–1.0)
CALCIUM: 9.6 mg/dL (ref 8.5–10.1)
Chloride: 102 mmol/L (ref 98–107)
Co2: 23 mmol/L (ref 21–32)
Creatinine: 1.19 mg/dL (ref 0.60–1.30)
GLUCOSE: 163 mg/dL — AB (ref 65–99)
Osmolality: 272 (ref 275–301)
Potassium: 4 mmol/L (ref 3.5–5.1)
SGOT(AST): 45 U/L — ABNORMAL HIGH (ref 15–37)
Sodium: 133 mmol/L — ABNORMAL LOW (ref 136–145)
Total Protein: 8.5 g/dL — ABNORMAL HIGH (ref 6.4–8.2)

## 2013-07-17 LAB — URINALYSIS, COMPLETE
BACTERIA: NONE SEEN
Bilirubin,UR: NEGATIVE
Blood: NEGATIVE
Glucose,UR: 150 mg/dL (ref 0–75)
KETONE: NEGATIVE
LEUKOCYTE ESTERASE: NEGATIVE
NITRITE: NEGATIVE
PH: 5 (ref 4.5–8.0)
RBC,UR: NONE SEEN /HPF (ref 0–5)
Specific Gravity: 1.029 (ref 1.003–1.030)
Squamous Epithelial: 1
WBC UR: 3 /HPF (ref 0–5)

## 2013-07-17 LAB — CBC
HCT: 41.5 % (ref 40.0–52.0)
HGB: 13.9 g/dL (ref 13.0–18.0)
MCH: 29.8 pg (ref 26.0–34.0)
MCHC: 33.5 g/dL (ref 32.0–36.0)
MCV: 89 fL (ref 80–100)
PLATELETS: 268 10*3/uL (ref 150–440)
RBC: 4.66 10*6/uL (ref 4.40–5.90)
RDW: 13.5 % (ref 11.5–14.5)
WBC: 8.8 10*3/uL (ref 3.8–10.6)

## 2013-07-17 LAB — ETHANOL

## 2013-07-17 LAB — ACETAMINOPHEN LEVEL

## 2013-07-17 LAB — SALICYLATE LEVEL: Salicylates, Serum: <1.7 mg/dL

## 2013-07-18 ENCOUNTER — Inpatient Hospital Stay: Payer: Self-pay | Admitting: Psychiatry

## 2013-07-18 LAB — CK TOTAL AND CKMB (NOT AT ARMC)
CK, TOTAL: 200 U/L
CK-MB: 3.2 ng/mL (ref 0.5–3.6)

## 2013-07-18 LAB — TROPONIN I: Troponin-I: <0.02 ng/mL

## 2013-07-22 ENCOUNTER — Emergency Department: Payer: Self-pay | Admitting: Emergency Medicine

## 2013-07-22 LAB — CBC WITH DIFFERENTIAL/PLATELET
BASOS PCT: 0.4 %
Basophil #: 0 10*3/uL (ref 0.0–0.1)
EOS ABS: 0 10*3/uL (ref 0.0–0.7)
Eosinophil %: 0.1 %
HCT: 45.2 % (ref 40.0–52.0)
HGB: 15.4 g/dL (ref 13.0–18.0)
Lymphocyte #: 1.5 10*3/uL (ref 1.0–3.6)
Lymphocyte %: 13.1 %
MCH: 30.2 pg (ref 26.0–34.0)
MCHC: 34.1 g/dL (ref 32.0–36.0)
MCV: 89 fL (ref 80–100)
Monocyte #: 0.5 x10 3/mm (ref 0.2–1.0)
Monocyte %: 4.1 %
NEUTROS ABS: 9.3 10*3/uL — AB (ref 1.4–6.5)
NEUTROS PCT: 82.3 %
PLATELETS: 258 10*3/uL (ref 150–440)
RBC: 5.11 10*6/uL (ref 4.40–5.90)
RDW: 13.5 % (ref 11.5–14.5)
WBC: 11.4 10*3/uL — ABNORMAL HIGH (ref 3.8–10.6)

## 2013-07-23 LAB — LIPASE, BLOOD: Lipase: 103 U/L (ref 73–393)

## 2013-07-23 LAB — COMPREHENSIVE METABOLIC PANEL
ALBUMIN: 3.7 g/dL (ref 3.4–5.0)
ANION GAP: 15 (ref 7–16)
Alkaline Phosphatase: 62 U/L
BUN: 26 mg/dL — ABNORMAL HIGH (ref 7–18)
Bilirubin,Total: 1.3 mg/dL — ABNORMAL HIGH (ref 0.2–1.0)
CREATININE: 1.54 mg/dL — AB (ref 0.60–1.30)
Calcium, Total: 11 mg/dL — ABNORMAL HIGH (ref 8.5–10.1)
Chloride: 97 mmol/L — ABNORMAL LOW (ref 98–107)
Co2: 19 mmol/L — ABNORMAL LOW (ref 21–32)
EGFR (Non-African Amer.): 49 — ABNORMAL LOW
GFR CALC AF AMER: 56 — AB
Glucose: 261 mg/dL — ABNORMAL HIGH (ref 65–99)
OSMOLALITY: 276 (ref 275–301)
POTASSIUM: 4.2 mmol/L (ref 3.5–5.1)
SGOT(AST): 40 U/L — ABNORMAL HIGH (ref 15–37)
SGPT (ALT): 76 U/L (ref 12–78)
SODIUM: 131 mmol/L — AB (ref 136–145)
TOTAL PROTEIN: 8.7 g/dL — AB (ref 6.4–8.2)

## 2013-07-23 LAB — TROPONIN I
Troponin-I: <0.02 ng/mL
Troponin-I: 0.03 ng/mL

## 2013-08-21 ENCOUNTER — Emergency Department: Payer: Self-pay | Admitting: Emergency Medicine

## 2013-08-21 LAB — COMPREHENSIVE METABOLIC PANEL
ALBUMIN: 4 g/dL (ref 3.4–5.0)
Alkaline Phosphatase: 69 U/L
Anion Gap: 12 (ref 7–16)
BUN: 24 mg/dL — AB (ref 7–18)
Bilirubin,Total: 0.5 mg/dL (ref 0.2–1.0)
CO2: 21 mmol/L (ref 21–32)
CREATININE: 1.75 mg/dL — AB (ref 0.60–1.30)
Calcium, Total: 9.4 mg/dL (ref 8.5–10.1)
Chloride: 97 mmol/L — ABNORMAL LOW (ref 98–107)
EGFR (African American): 48 — ABNORMAL LOW
EGFR (Non-African Amer.): 42 — ABNORMAL LOW
GLUCOSE: 457 mg/dL — AB (ref 65–99)
OSMOLALITY: 285 (ref 275–301)
Potassium: 4.3 mmol/L (ref 3.5–5.1)
SGOT(AST): 42 U/L — ABNORMAL HIGH (ref 15–37)
SGPT (ALT): 44 U/L (ref 12–78)
SODIUM: 130 mmol/L — AB (ref 136–145)
Total Protein: 8.6 g/dL — ABNORMAL HIGH (ref 6.4–8.2)

## 2013-08-21 LAB — CBC
HCT: 42.9 % (ref 40.0–52.0)
HGB: 14.4 g/dL (ref 13.0–18.0)
MCH: 29.6 pg (ref 26.0–34.0)
MCHC: 33.4 g/dL (ref 32.0–36.0)
MCV: 89 fL (ref 80–100)
PLATELETS: 267 10*3/uL (ref 150–440)
RBC: 4.84 10*6/uL (ref 4.40–5.90)
RDW: 13 % (ref 11.5–14.5)
WBC: 7.9 10*3/uL (ref 3.8–10.6)

## 2013-08-21 LAB — ETHANOL
Ethanol %: 0.271 % — ABNORMAL HIGH (ref 0.000–0.080)
Ethanol: 271 mg/dL

## 2013-08-21 LAB — TROPONIN I: Troponin-I: <0.02 ng/mL

## 2013-08-21 LAB — ACETAMINOPHEN LEVEL: Acetaminophen: <2 ug/mL

## 2013-08-21 LAB — SALICYLATE LEVEL

## 2013-08-21 IMAGING — CR DG CHEST 1V PORT
1 series · 1 of 1 positions shown · non-contrast
Comparison: [DATE]

CLINICAL DATA: Chest pain

EXAM:
PORTABLE CHEST - 1 VIEW

[ap]
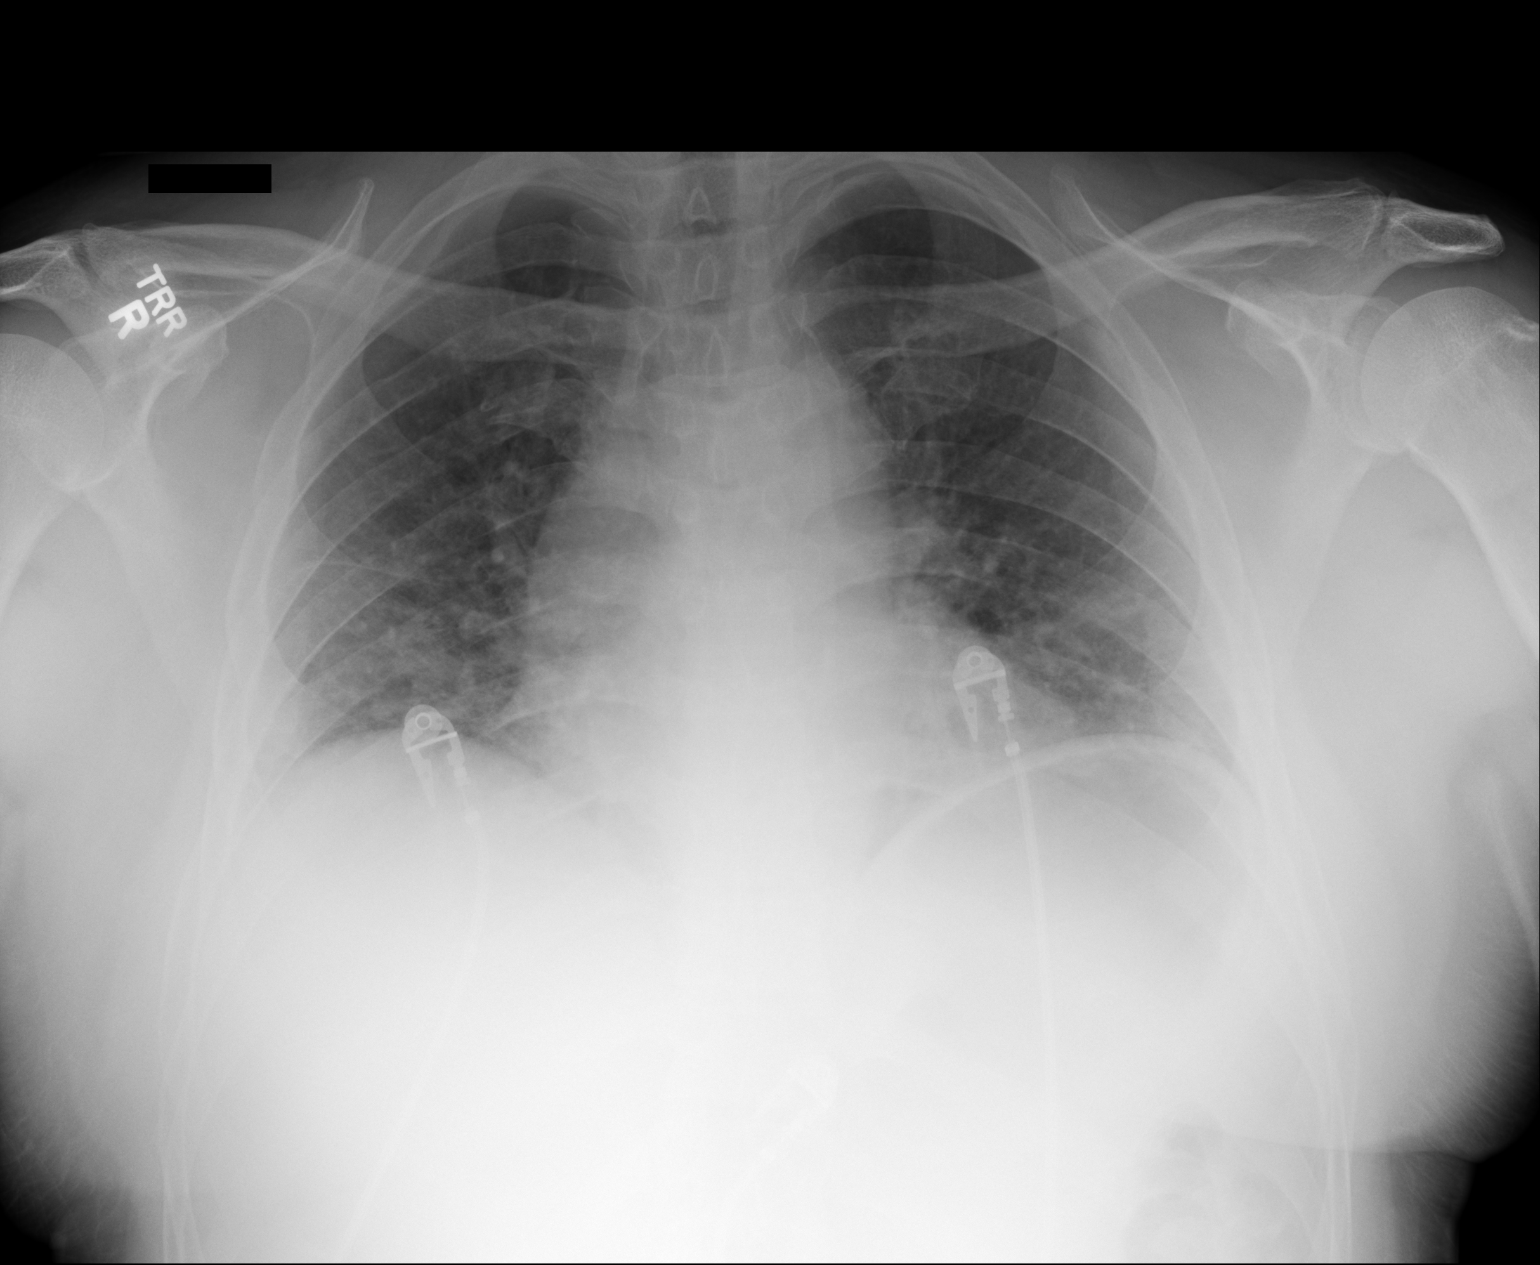

[1 of 1 positions shown; findings below may reference images not displayed]

FINDINGS: There are low lung volumes with lung base bronchovascular crowding.
No convincing infiltrate. No pulmonary edema.

Cardiac silhouette is normal in size. No convincing mediastinal or
hilar mass. No pleural effusion or pneumothorax.

Bony thorax is demineralized but grossly intact.
IMPRESSION: No acute cardiopulmonary disease.

## 2013-08-22 LAB — URINALYSIS, COMPLETE
Bilirubin,UR: NEGATIVE
Blood: NEGATIVE
Glucose,UR: >=500 mg/dL (ref 0–75)
Ketone: NEGATIVE
Leukocyte Esterase: NEGATIVE
Nitrite: NEGATIVE
PROTEIN: NEGATIVE
Ph: 5 (ref 4.5–8.0)
RBC, UR: NONE SEEN /HPF (ref 0–5)
Specific Gravity: 1.017 (ref 1.003–1.030)
Squamous Epithelial: <1

## 2013-08-22 LAB — DRUG SCREEN, URINE
Amphetamines, Ur Screen: NEGATIVE (ref ?–1000)
Barbiturates, Ur Screen: NEGATIVE (ref ?–200)
Benzodiazepine, Ur Scrn: NEGATIVE (ref ?–200)
COCAINE METABOLITE, UR ~~LOC~~: POSITIVE (ref ?–300)
Cannabinoid 50 Ng, Ur ~~LOC~~: NEGATIVE (ref ?–50)
MDMA (Ecstasy)Ur Screen: NEGATIVE (ref ?–500)
METHADONE, UR SCREEN: NEGATIVE (ref ?–300)
OPIATE, UR SCREEN: NEGATIVE (ref ?–300)
PHENCYCLIDINE (PCP) UR S: NEGATIVE (ref ?–25)
Tricyclic, Ur Screen: NEGATIVE (ref ?–1000)

## 2013-08-22 IMAGING — CT CT HEAD WITHOUT CONTRAST
1 series · 16 of 30 positions shown, 20 images · non-contrast
Comparison: None.

CLINICAL DATA: Lethargy.  Altered mental status.

EXAM:
CT HEAD WITHOUT CONTRAST
TECHNIQUE: Contiguous axial images were obtained from the base of the skull
through the vertex without intravenous contrast.

[Series 2: head wo · axial · 0.46mm/px · z∈[+536,+684]mm · 16 of 36 slices shown, 20 images]
[im 2/36  brain]
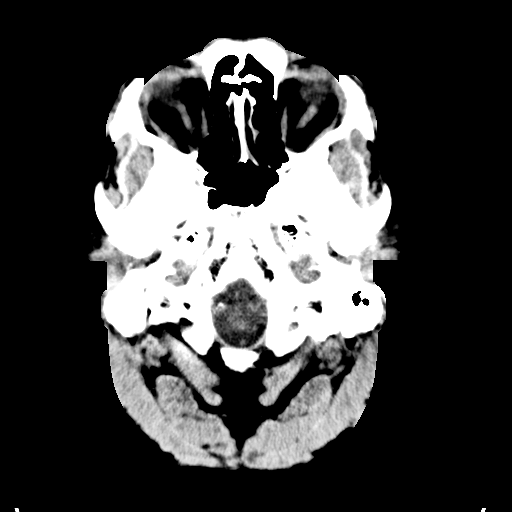
[im 2/36  bone]
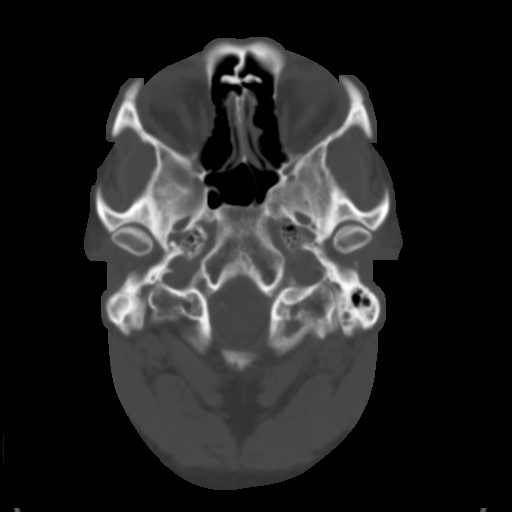
[im 4/36  brain]
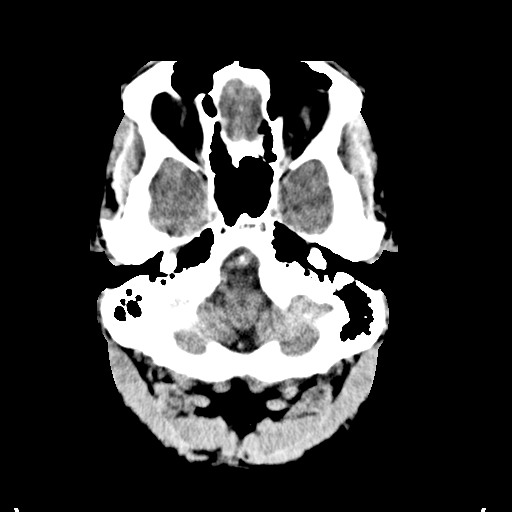
[im 7/36  brain]
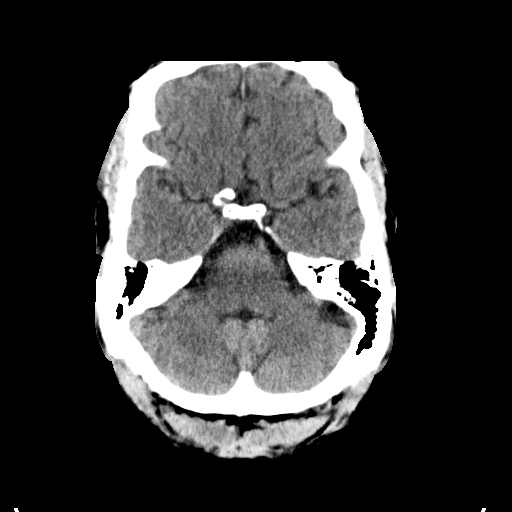
[im 9/36  brain]
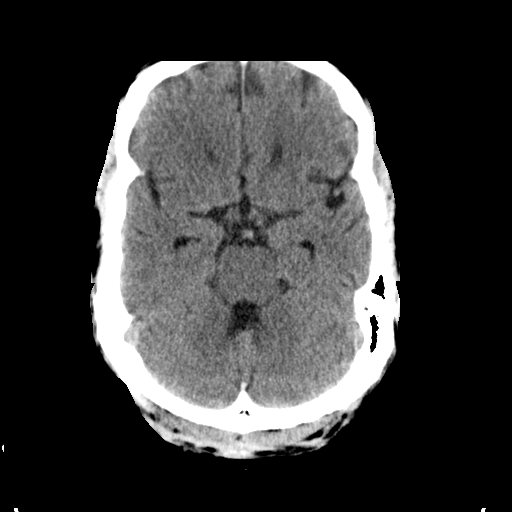
[im 10/36  brain]
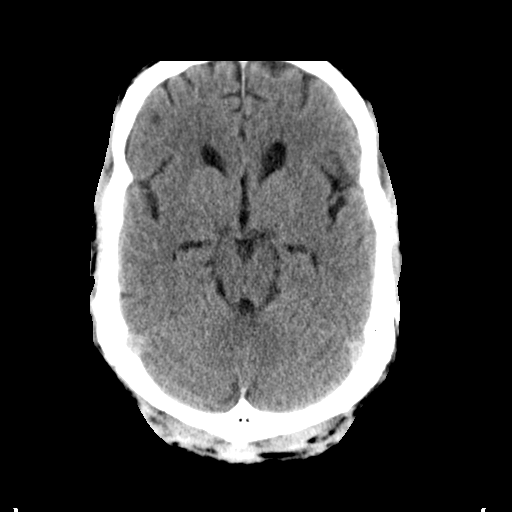
[im 10/36  bone]
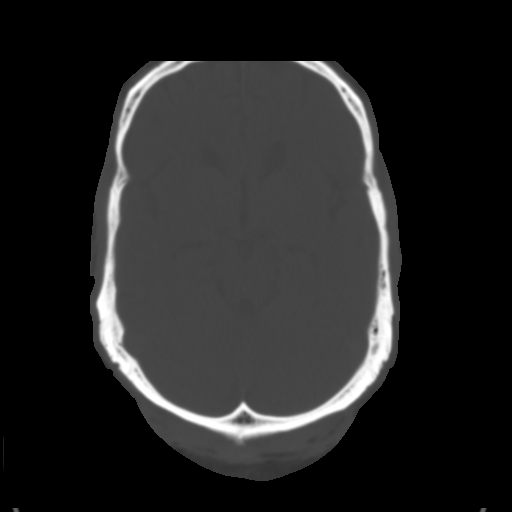
[im 13/36  brain]
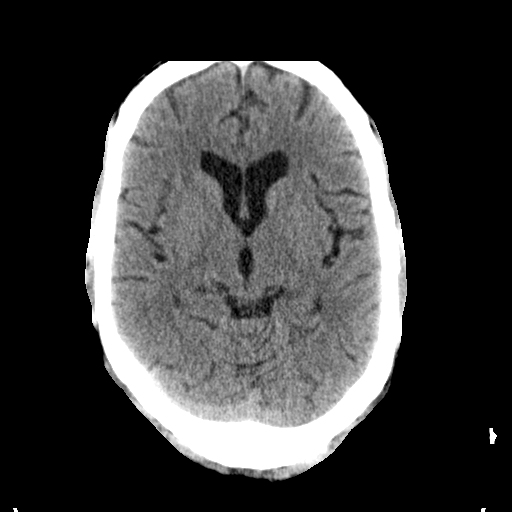
[im 15/36  brain]
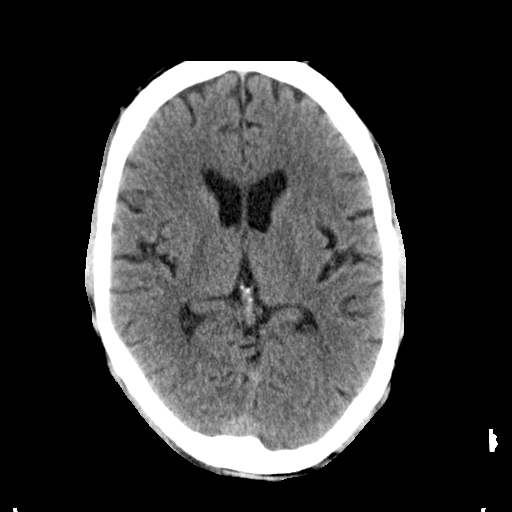
[im 17/36  brain]
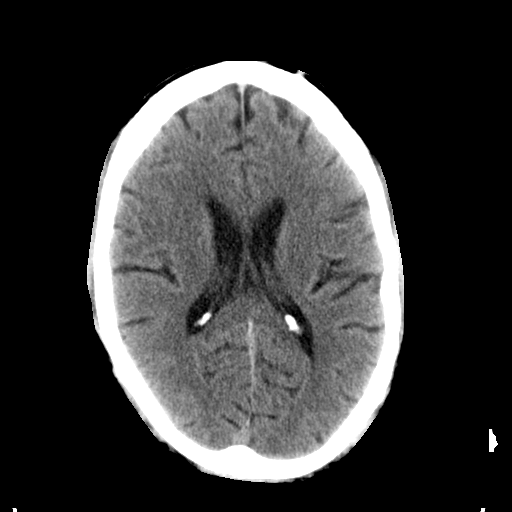
[im 19/36  brain]
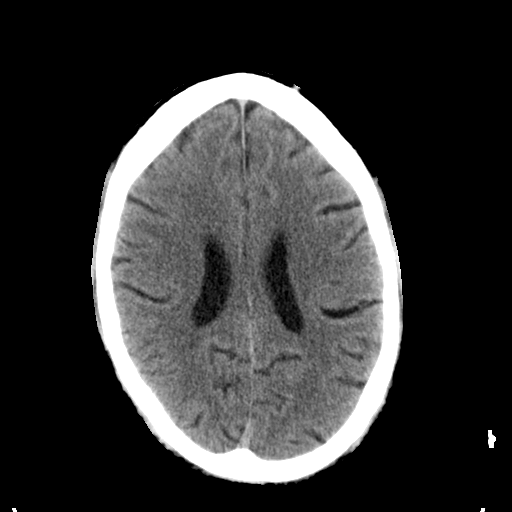
[im 19/36  bone]
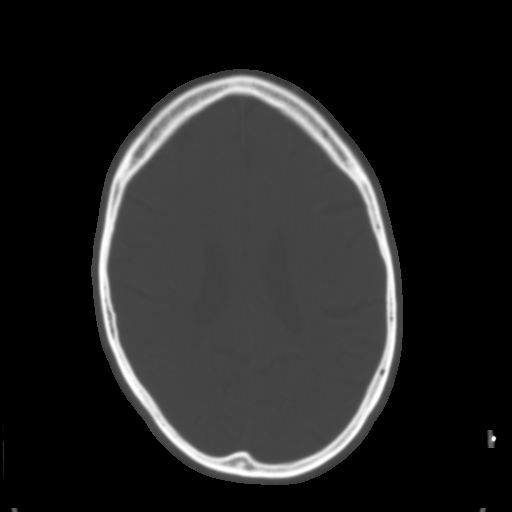
[im 21/36  brain]
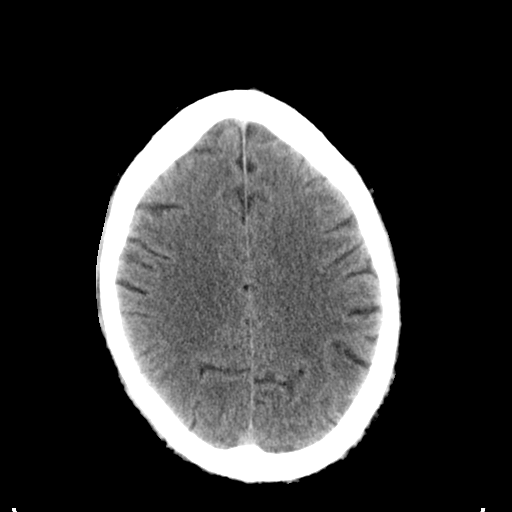
[im 23/36  brain]
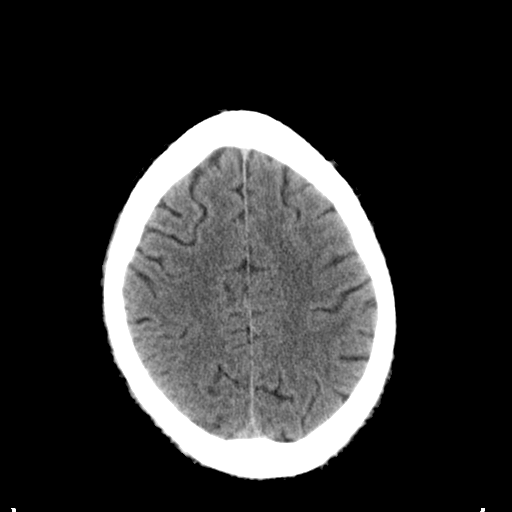
[im 26/36  brain]
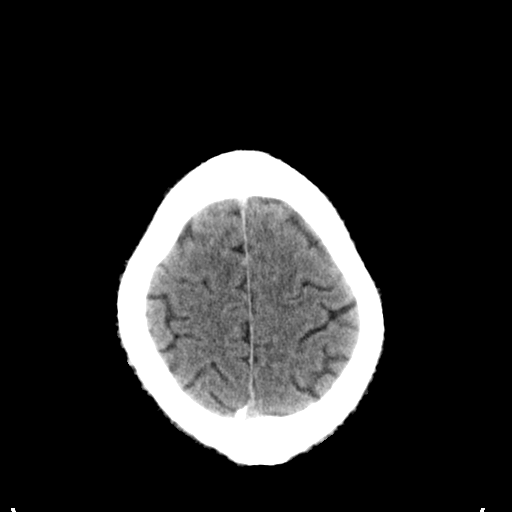
[im 27/36  brain]
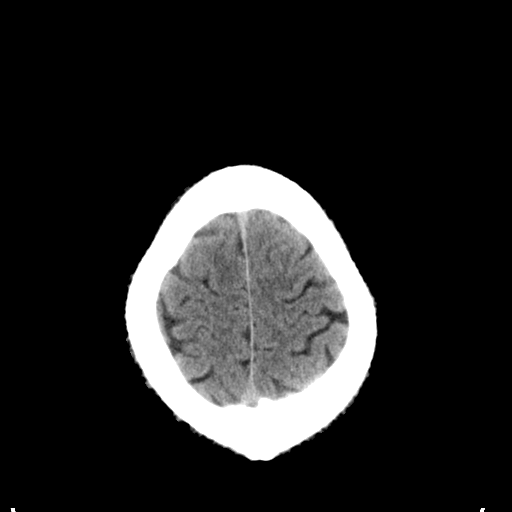
[im 27/36  bone]
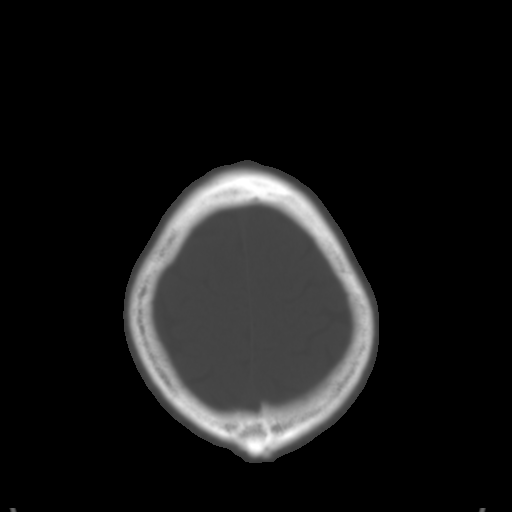
[im 29/36  brain]
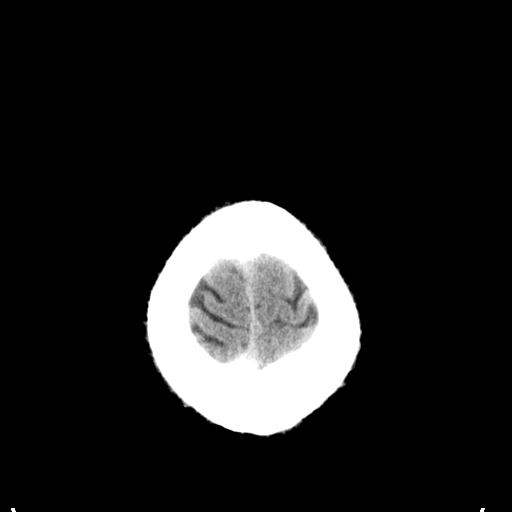
[im 32/36  brain]
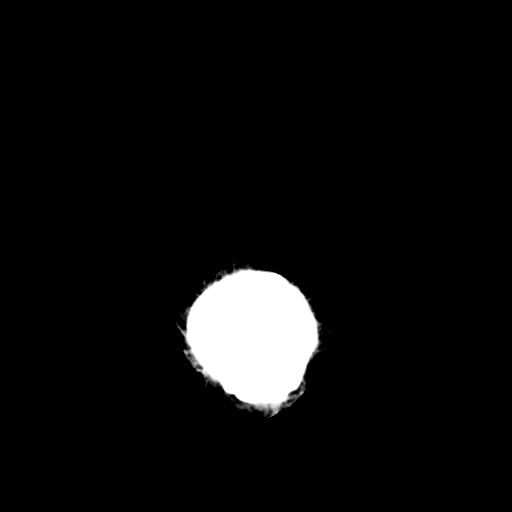
[im 34/36  brain]
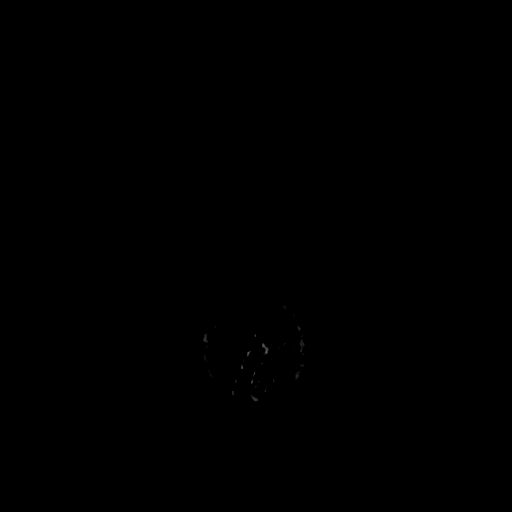

[16 of 30 positions shown; findings below may reference images not displayed]

FINDINGS: Ventricles are normal in configuration. There is ventricular and
sulcal enlargement reflecting mild atrophy. No parenchymal masses or
mass effect. No areas of abnormal parenchymal attenuation. No
evidence of an infarct.

There are no extra-axial masses or abnormal fluid collections.

No intracranial hemorrhage.

Visualized sinuses and mastoid air cells are clear. No skull lesion.
IMPRESSION: No acute intracranial abnormalities.

## 2013-09-18 NOTE — Telephone Encounter (Signed)
This encounter was created in error - please disregard.

## 2013-09-19 ENCOUNTER — Telehealth: Payer: Self-pay

## 2013-09-19 NOTE — Telephone Encounter (Signed)
Request from Disability Determination Services , sent to [facility] on  09/19/2013. Also mailed release packet to pt

## 2013-10-16 ENCOUNTER — Telehealth: Payer: Self-pay

## 2013-10-16 NOTE — Telephone Encounter (Signed)
Request from  Disability Determination Services , sent to [facility] on 10/16/2013.  Also mailed release form to pt  

## 2014-04-24 ENCOUNTER — Ambulatory Visit: Payer: Self-pay | Admitting: Surgery

## 2014-04-24 LAB — COMPREHENSIVE METABOLIC PANEL
AST: 23 U/L (ref 15–37)
Albumin: 3 g/dL — ABNORMAL LOW (ref 3.4–5.0)
Alkaline Phosphatase: 59 U/L (ref 46–116)
Anion Gap: 6 — ABNORMAL LOW (ref 7–16)
BUN: 18 mg/dL (ref 7–18)
Bilirubin,Total: 0.5 mg/dL (ref 0.2–1.0)
CALCIUM: 9.4 mg/dL (ref 8.5–10.1)
CHLORIDE: 101 mmol/L (ref 98–107)
Co2: 28 mmol/L (ref 21–32)
Creatinine: 1.01 mg/dL (ref 0.60–1.30)
EGFR (African American): >60
EGFR (Non-African Amer.): >60
GLUCOSE: 214 mg/dL — AB (ref 65–99)
Osmolality: 278 (ref 275–301)
Potassium: 4.4 mmol/L (ref 3.5–5.1)
SGPT (ALT): 37 U/L (ref 14–63)
Sodium: 135 mmol/L — ABNORMAL LOW (ref 136–145)
TOTAL PROTEIN: 7.3 g/dL (ref 6.4–8.2)

## 2014-04-24 LAB — CBC WITH DIFFERENTIAL/PLATELET
BASOS PCT: 0.6 %
Basophil #: 0 10*3/uL (ref 0.0–0.1)
Eosinophil #: 0.3 10*3/uL (ref 0.0–0.7)
Eosinophil %: 5.2 %
HCT: 44.6 % (ref 40.0–52.0)
HGB: 15.2 g/dL (ref 13.0–18.0)
Lymphocyte #: 2.4 10*3/uL (ref 1.0–3.6)
Lymphocyte %: 35.3 %
MCH: 30.3 pg (ref 26.0–34.0)
MCHC: 34 g/dL (ref 32.0–36.0)
MCV: 89 fL (ref 80–100)
MONO ABS: 0.5 x10 3/mm (ref 0.2–1.0)
Monocyte %: 8 %
NEUTROS ABS: 3.4 10*3/uL (ref 1.4–6.5)
Neutrophil %: 50.9 %
PLATELETS: 252 10*3/uL (ref 150–440)
RBC: 5.02 10*6/uL (ref 4.40–5.90)
RDW: 12.7 % (ref 11.5–14.5)
WBC: 6.7 10*3/uL (ref 3.8–10.6)

## 2014-07-08 NOTE — Consult Note (Signed)
Brief Consult Note: Diagnosis: NSTEMI, hypertensive urgency.   Patient was seen by consultant.   Consult note dictated.   Comments: Start Heparin drip.  Needs BP control. I will change Metoprolol to Coreg and add Lisinopril.  Cardiac cath tomorrow.  Electronic Signatures: Lorine BearsArida, Romyn Boswell (MD)  (Signed 29-Jul-13 12:33)  Authored: Brief Consult Note   Last Updated: 29-Jul-13 12:33 by Lorine BearsArida, Keimya Briddell (MD)

## 2014-07-08 NOTE — H&P (Signed)
PATIENT NAME:  Steve Alvarado, Steve Alvarado MR#:  [id number] DATE OF BIRTH:  05-29-1955  DATE OF ADMISSION:  10/17/2011  CHIEF COMPLAINT: Chest pain.   CARDIOLOGIST: Dr. Mariah Milling.   HISTORY OF PRESENT ILLNESS: 59 year old African American male with chronic medical conditions of coronary artery disease status post bare metal stent placements in RCA in May 2012 presents with chest pain. The patient notes that since discharge he has continued some of his medications, such as Effient but due to financial concerns, has been unable to continue his blood pressure medicines. He notes that chest pain woke him up from sleep, was intermittent in nature, lasting between 20 to 30 minutes, pressure-like sensation between 8-9/10 intensity. It was radiating to bilateral arms and to his back. This chest pain was associated with shortness of breath, but he denies nausea, diaphoresis, emesis, or palpitations. Due to the ongoing symptoms, he presented to the Emergency Department. He notes that prior to these symptoms he has had intermittent chest tightness when he exerts himself such as when he plays basketball he will feel chest tightness while he is playing, prompting him occasionally to stop.   In the Emergency Department he was noted to be hypertensive with blood pressures in the 180s. He received his usual dose of Toprol and Imdur and systolic blood pressures have decreased to the 160s. Of note, initial cardiac enzymes were negative. However, his second set increased to 0.01, so we are called to admit for further evaluation.   The patient recalled that he had a stress test with Dr. Mariah Milling in June that was negative.   PAST MEDICAL HISTORY:  1. Hypertension. 2. Coronary artery disease, status post bare metal stent placement in the RCA in 2012. There was diffuse disease in the left circumflex, moderate mid LAD disease and mild proximal OM disease. However, there was no intervention planned on the left circumflex due to size and  diffuse disease. The patient had preserved ejection fraction at that time with an ejection fraction of 50%.  3. Diabetes mellitus.  4. Hyperlipidemia.   PAST SURGICAL HISTORY:  1. Left hand surgery 2. Cardiac stents.   MEDICATIONS:  1. 70/30 insulin at 40 units subcutaneous injections twice a day.  2. Metformin 500 mg twice a day.  3. Effient 10 milligrams daily.   The patient ran out of all his other medications.   ALLERGIES: No known drug allergies.   SOCIAL HISTORY: Denies tobacco. Denies current alcohol or illicit drug use. Last alcohol and drug use was four years ago. He currently works at [employer] in [employer]. He is married. He has been with his wife for 22 years. They share 11 children from prior relationships. They have 26 grandchildren and two great grandchildren.   FAMILY HISTORY: Notable for myocardial infarction. Mother deceased in 24s from myocardial infarction. Brother has diabetes and hypertension. Father was murdered.   REVIEW OF SYSTEMS: CONSTITUTIONAL: Notable for fatigue. No weight loss. No weight gain. No fever or chills. EYES: No change in vision. No blurred vision. No double vision. ENT: No tinnitus. No ear pain. No hearing loss. No epistaxis. RESPIRATORY: No cough. No wheeze. No hemoptysis. No dyspnea. CARDIAC: He does have chest pain a noted. No orthopnea. No edema. No palpitations. GASTROINTESTINAL: No nausea. No vomiting. No diarrhea. No abdominal pain. No hematemesis. No melena. No rectal bleeding. GU: No dysuria, hematuria, or frequency. ENDOCRINE: Denies increased sweats. HEMATOLOGY: Denies easy bruisability. MUSCULOSKELETAL: Denies any muscle aches or pains. No joint pains. NEUROLOGIC: Denies paresthesias. PSYCH: Denies  any mood changes.   PHYSICAL EXAMINATION:  VITAL SIGNS: Blood pressure ranging between systolic 176 to 184 with diastolic range between 93 to 105, temperature 97.9, pulse ranging between 76 to 85, respirations 18, sating 100% on room air.    GENERAL: Well appearing African American male in no apparent distress lying comfortably in a stretcher speaking in full sentences. Awake, alert, oriented x3.   HEENT: Normocephalic, atraumatic. Extraocular muscles intact. Muddy sclera. Pink conjunctiva.   NECK: Supple. No lymphadenopathy.   CARDIAC: S1, S2, regular rate and rhythm. No murmurs.   CHEST: Clear to auscultation bilaterally.   ABDOMEN: Soft with normal bowel sounds, nontender, nondistended.   EXTREMITIES: No clubbing, cyanosis, or edema. There are no open wounds on the extremities. He has 2+ distal pulses bilaterally.   NEUROLOGIC: Nonfocal.   LABORATORY, RADIOLOGICAL AND DIAGNOSTIC DATA: WBC count 7, hemoglobin 15.6, hematocrit 44.1, platelets 229, with an MCV of 86. Troponin initially was less than 0.02. Second set came up to 0.01. CK is 372 with a CPK-MB of 4. Repeat CK 297 and repeat CK-MB 3.9. Glucose 238, BUN 13, creatinine 1.12, sodium 140, potassium 3.8, chloride 105, bicarbonate 25, calcium 9, anion gap 10, osmolality 287.   EKG: Normal sinus rhythm at 85 beats per minute with Q wave in the inferior leads indicating prior inferior infarct. There are flattened T waves in the limb leads and V6. The T wave abnormalities are similar to prior EKG from May 2012.   Chest x-ray shows no acute disease in the chest.   ASSESSMENT AND PLAN: A 59 year old gentleman with coronary artery disease, presenting with chest pain, found to be hypertensive and also have elevated cardiac markers concerning for hypertensive emergency versus non-ST elevation myocardial infarction.  1. Chest pain. Differential diagnosis is hypertensive emergency, versus NSTEMI at this time. We will trend cardiac enzymes and consult cardiology. Will continue to manage his blood pressure with p.o. medicines as well as IV medications as needed for significant elevated blood pressures. He had a negative stress test reportedly. If the patient does have rising cardiac  enzymes, at that time we will transition him to full dose anticoagulation and discuss need for repeat cardiac catheterization.  2. Hypertension, uncontrolled. This is due to noncompliance with medications. We will resume his home medications and monitor blood pressures.  3. Coronary artery disease. The patient has been compliant with Effient. We will continue Effient, aspirin, beta blocker therapy and statin. He has preserved ejection fraction at 50%.  4. Diabetes mellitus. The patient does not recall the last time he performed an Accu-Chek. His last hemoglobin A1c in May 2012 was 11.6. Given acute hospitalization will slightly decrease his insulin regimen, but this will be adjusted based on his Accu-Cheks. Will hold his metformin while he is hospitalized. He has not been taking his glipizide.  5. Hyperlipidemia. Continue Pravachol.  6. Prophylaxis. Lovenox. If the patient does have NSTEMI, this will be transitioned to full dose anticoagulation.   CODE STATUS FULL CODE. Decision-maker if the patient is incapacitated is his wife, Radio producerowena Brocksmith.   Thank you for involving us in the care of this patient.  ____________________________ Aurther LoftAdaorah E. Bethann Qualley, DO aeo:ap D: 10/17/2011 09:45:47 ET T: 10/17/2011 10:48:15 ET JOB#: [id number]  cc: Aurther LoftAdaorah E. Kharma Sampsel, DO, <Dictator> Antonieta Ibaimothy J. Gollan, MD Damont Balles E Preslei Blakley DO ELECTRONICALLY SIGNED 10/19/2011 5:27

## 2014-07-08 NOTE — Consult Note (Signed)
PATIENT NAME:  Steve Alvarado, SURETTE MR#:  [id number] DATE OF BIRTH:  12/06/1955  DATE OF CONSULTATION:  10/17/2011  REFERRING PHYSICIAN:  Sanda Klein, DO CONSULTING PHYSICIAN:  Muhammad A. Kirke Corin, MD  PRIMARY CARDIOLOGIST: Julien Nordmann, MD  REASON FOR CONSULTATION: Myocardial infarction.   HISTORY OF PRESENT ILLNESS: This is a 59 year old African American male with known history of coronary artery disease status post bare metal stent placement to the RCA in May of 2012. He also has history of hypertension, hyperlipidemia, and type 2 diabetes currently requiring insulin. The patient presented with chest pain which woke him up at 11:00 p.m. It was severe substernal tightness which lasted for about 30 minutes. He did not take nitroglycerin. He has been chest pain-free since then. He has noticed a few episodes of chest tightness lately while playing basketball. The patient has financial difficulty in affording his medications. He has been taking Effient. He also takes insulin regularly and metformin. However, he has not been able to get his blood pressure medications. The patient was found to be hypertensive on presentation with systolic blood pressure in the 180s. His cardiac enzymes were noted to be slightly elevated. He is currently chest pain free. The patient has not been seen by Dr. Mariah Milling since November of 2012. No stress testing has been done since his stent placement.   PAST MEDICAL HISTORY:  1. Coronary artery disease status post bare metal stent placement to the RCA in May of 2012. There was diffuse disease in the left circumflex and moderate mid LAD disease. Ejection fraction was 50%.  2. Hypertension.  3. Type 2 diabetes.  4. Hyperlipidemia.   HOME MEDICATIONS:  1. Insulin 70/30 40 units twice daily.  2. Metformin 500 mg twice a day.  3. Effient 10 mg daily.   He was also supposed to be on the following, however, he has not been taking these medications. 1. Glipizide 5 mg  daily. 2. Hydrochlorothiazide 25 mg daily. 3. Imdur 30 mg daily. 4. Metoprolol tartrate 25 mg twice daily. 5. Pravastatin 20 mg once daily.    ALLERGIES: No known drug allergies.   SOCIAL HISTORY: Negative for smoking, tobacco, and drug use. He works at [employer] in [employer].   FAMILY HISTORY: History is remarkable for coronary artery disease. His mother died in her early 27s from myocardial infarction.   REVIEW OF SYSTEMS: A 10 point review of systems was performed. It is negative other than what is mentioned in the history of present illness.   PHYSICAL EXAMINATION:   GENERAL: The patient appears to be at his stated age in no acute distress.   VITAL SIGNS: Temperature 98.3, pulse 62, respiratory rate 12, blood pressure 155/81, and oxygen saturation is 98% on room air.   HEENT: Normocephalic, atraumatic.   NECK: No jugular venous distention or carotid bruits.   RESPIRATORY: Normal respiratory effort with no use of accessory muscles. Auscultation reveals normal breath sounds.   CARDIOVASCULAR: Normal PMI. Normal S1 and S2 with no gallops or murmurs.   ABDOMEN: Benign, nontender, and nondistended.   EXTREMITIES: No clubbing, cyanosis, or edema.   SKIN: Warm and dry with no rash.   PSYCHIATRIC: He is alert and oriented x3 with normal mood and affect.   LABORATORY AND DIAGNOSTIC DATA: His creatinine was 1.12. CPK on presentation was elevated at 372 and subsequently decreased to 297. Troponin was less than 0.02 and subsequently increased to 0.1. Hemoglobin is normal.   ECG showed normal sinus rhythm with possible old  inferior myocardial infarction which is unchanged from prior ECG.   IMPRESSION:  1. Non-ST-elevation myocardial infarction with known history of coronary artery disease.  2. Hypertensive urgency likely due to poor compliance with medications.  3. Type 2 diabetes.  4. Known history of coronary artery disease status post bare metal stent placement to the RCA in  2012.   RECOMMENDATIONS: The patient's history is consistent with non-ST-elevation myocardial infarction. Although type II non-ST-elevation myocardial infarction is a possibility due to uncontrolled hypertension, the patient has known history of coronary artery disease with previous stenting and moderate disease in the LAD. He also has multiple uncontrolled risk factors and thus new plaque rupture is a possibility. Due to that, I recommend initiating treatment with unfractionated heparin. Continue aspirin. He needs blood pressure control. I recommend generic medications in order for him to be able to afford. He will need also once daily or at most twice daily medications to improve compliance. I will switch his metoprolol to Carvedilol and add lisinopril. I recommend proceeding with cardiac catheterization and possible coronary intervention tomorrow. Risks, benefits, and alternatives were discussed with the patient.  ____________________________ Chelsea AusMuhammad A. Kirke CorinArida, MD maa:slb D: 10/17/2011 12:43:11 ET     T: 10/17/2011 13:51:13 ET        JOB#: [id number] cc: Muhammad A. Kirke CorinArida, MD, <Dictator> Antonieta Ibaimothy J. Gollan, MD Jerolyn CenterMUHAMMAD Argentina DonovanA ARIDA MD ELECTRONICALLY SIGNED 10/19/2011 15:25

## 2014-07-08 NOTE — Discharge Summary (Signed)
PATIENT NAME:  Steve Alvarado, Steve Alvarado MR#:  [id number] DATE OF BIRTH:  07-Jul-1955  DATE OF ADMISSION:  10/17/2011 DATE OF DISCHARGE:  10/18/2011  PRIMARY CARDIOLOGIST: Dr. Mariah MillingGollan   DISCHARGE DIAGNOSES:  1. Non-ST elevation myocardial infarction.  2. Uncontrolled hypertension.  3. Diabetes mellitus.  4. Hyperlipidemia. 5. Noncompliance.   CONSULT: Dr. Kirke CorinArida of cardiology.   PROCEDURES: None.   LABORATORY, DIAGNOSTIC AND RADIOLOGICAL DATA: Chest x-ray showed no acute disease.   ADMITTING HISTORY AND PHYSICAL: Please see detailed history and physical dictated on 10/17/2011. In brief, 59 year old patient with history of noncompliance and coronary artery disease along with hypertension, diabetes presented to the Emergency Room complaining of chest pain. Patient was found to have elevated troponin, admitted to Critical Care Unit for NSTEMI on heparin drip and cardiology was consulted. Dr. Kirke CorinArida of cardiology saw the patient, scheduled him for a cardiac catheterization but patient did not want any cardiac catheterization and chose medical management. Patient was discontinued on the heparin. Patient will be on a beta blocker, ACE inhibitor, aspirin, statin and is being discharged home in a stable condition. His blood pressure and blood sugars are in the normal range at the time of discharge. He is chest pain free. Does not have any shortness of breath and cardiovascular examination is normal. Patient has been counseled extensively to be compliant with medications and follow up with his doctor. Also his medications have been chosen from $[facility] list.    DISCHARGE MEDICATIONS:  1. Pravastatin 40 mg oral once a day.  2. Aspirin 81 mg oral once a day.  3. Metformin 500 mg oral 2 times a day.  4. Lisinopril 20 mg oral once a day.  5. Nitroglycerin 0.4 mg sublingual as needed for chest pain.  6. Metoprolol tartrate 25 mg oral 2 times a day.  7. Novolin 70/30 subcutaneous 40 units subcutaneous 2 times  a day.  8. Effient 10 mg oral once a day.   DISCHARGE INSTRUCTIONS: Follow up with primary care physician and cardiology in one week. Patient can return to work once he visits his doctor's appointment. Patient has been counseled to be compliant with his medications. He will be on a low-salt low-fat diabetic diet. This plan was discussed with the patient who verbalized understanding and is okay with the plan.   TIME SPENT: Time spent today on discharge dictation along with coordinating care and counseling of the patient was 40 minutes.  ____________________________ Molinda BailiffSrikar R. Zeina Akkerman, MD srs:cms D: 10/18/2011 14:35:11 ET T: 10/19/2011 11:21:05 ET JOB#: [id number]  cc: Wardell HeathSrikar R. Smantha Boakye, MD, <Dictator> Orie FishermanSRIKAR R Xiong Haidar MD ELECTRONICALLY SIGNED 10/19/2011 14:01

## 2014-07-12 NOTE — Consult Note (Signed)
General Aspect Mr. Hendrickson is a 59yo African-American male w/ PMHx s/f CAD (s/p BMS-RCA in 07/2010), DM2, HTN, HLD, hypothyroidism and polysubstance abuse who was admitted to [facility] today for SVT, NSTEMI. Cardiology consulted for the same.   Cardiac cath 07/2010: 50% mid LAD, 80% prox LCx, 40% OM1, 99% mid RCA s/p BMS. He was started on ASA/Effient. He was admitted 09/2011 for NSTEMI in the setting of marked hypertension. Troponins did return elevated (peak 2.60). Cardiac catheterization was recommended; however, the patient deferred and he was medically managed.  He was last seen in follow-up 12/2012 and noted to be largely stable from a cardiac perspective. BP was well-controlled on current antihypertensives. No further recommendations were made.   Over the past month, he reports experiencing left-sided chest heaviness on exertion relieved after a few minutes of rest and more recently requiring 1, then 2 sublingual nitroglycerins. This AM, he was awoken by a severe, 10/10 episode of chest heaviness w/ associated tachy-palpitations, lightheadedness and nausea. He attempted multiple NTG SL w/o improvement. The episode lasted for ~ 2 hours until his wife transported him to the [facility] ED. Note, he does endorse taking marijuana and cocaine ~ 3 days ago.   Present Illness . There, EKG and telemetry revealed a supraventricular tachycardia (AVNRT), rate 182 bpm with diffuse 1-2 mm flat ST depressions and an isolated 1 mm ST elevation in aVR. He received adenosine 45m IV x 1 w/ termination and conversion to NSR. His chest discomfort immediately resolved. An initial TnI returned WNL, but a second troponin returned elevated at 3.50. CXR- mildly increased right infrahilar lung markings likely reflects subsegmental atelectasis. There is no evidence of CHF. A followup PA and lateral chest x-ray would be of value.He was heparinized.   [facility] This was managed in the ED  and [facility].  He is currently stable, alert and in no discomfort.   PAST MEDICAL HISTORY CAD s/p prior PCI Type 2 DM Hypertension Hyperlipidemia Polysubstance abuse  PAST SURGICAL HISTORY Cardiac catheterization/PTCA/PCI Left hand surgery  FAMILY HISTORY Mother with heart disease, passed at 782s Brother with CAD, passed at 550from a MI.  SOCIAL HISTORY Affirms marijuana and cocaine abuse. Denies EtOH or tobacco abuse[place] Works in a factory.   Physical Exam:  GEN well developed, no acute distress   HEENT pink conjunctivae, PERRL, hearing intact to voice   NECK supple  No masses  trachea midline   RESP normal resp effort  clear BS  no use of accessory muscles   CARD Regular rate and rhythm  Normal, S1, S2  No murmur   ABD denies tenderness  soft  normal BS   LYMPH negative neck   EXTR negative cyanosis/clubbing, negative edema   SKIN normal to palpation   NEURO follows commands, motor/sensory function intact   PSYCH alert, A+O to time, place, person   Review of Systems:  Subjective/Chief Complaint chest pain, tachycardia   General: Fatigue   Skin: No Complaints    ENT: No Complaints    Eyes: No Complaints    Neck: No Complaints    Respiratory: No Complaints   Cardiovascular: Chest pain or discomfort  Tightness  Palpitations   Gastrointestinal: No Complaints  Nausea   Genitourinary: No Complaints    Vascular: No Complaints    Musculoskeletal: No Complaints    Neurologic: No Complaints  Hematologic: No Complaints    Endocrine: No Complaints    Psychiatric: No Complaints    Review of Systems: All other systems were reviewed and found to be negative   Medications/Allergies Reviewed Medications/Allergies reviewed    Home Medications: Medication Instructions Status  Cozaar 100 mg oral tablet 1 tab(s) orally once a day Active  metoprolol tartrate 50 mg oral  tablet 1 tab(s) orally 2 times a day Active  NovoLOG Mix 70/30 30 units-70 units/mL subcutaneous suspension 40 unit(s) subcutaneous 2 times a day Active  amLODIPine 5 mg oral tablet 1 tab(s) orally once a day Active  hydrochlorothiazide 25 mg oral tablet 1 tab(s) orally once a day Active  Synthroid 25 mcg (0.025 mg) oral tablet 1 tab(s) orally once a day Active  metformin 1000 mg oral tablet 1 tab(s) orally 2 times a day Active   Lab Results:  Hepatic:  15-Apr-15 09:00   Bilirubin, Total 0.5  Alkaline Phosphatase 69 (45-117 NOTE: New Reference Range 02/08/13)  SGPT (ALT)  86  SGOT (AST)  93  Total Protein, Serum  8.4  Albumin, Serum 3.5  Routine Chem:  15-Apr-15 09:00   Result Comment Magnesium - Slight hemolysis, interpret results with  - caution.  Result(s) reported on 03 Jul 2013 at 10:59AM.  Result Comment Potassium/AST - Slight hemolysis, interpret results with  - caution.  Result(s) reported on 03 Jul 2013 at 10:11AM.  Magnesium, Serum 2.0 (1.8-2.4 THERAPEUTIC RANGE: 4-7 mg/dL TOXIC: > 10 mg/dL  -----------------------)  Glucose, Serum  403  BUN  19  Creatinine (comp) 1.06  Sodium, Serum  132  Potassium, Serum 4.9  Chloride, Serum 101  Calcium (Total), Serum 9.4  Osmolality (calc) 284  eGFR (African American) >60  eGFR (Non-African American) >60 (eGFR values <31m/min/1.73 m2 may be an indication of chronic kidney disease (CKD). Calculated eGFR is useful in patients with stable renal function. The eGFR calculation will not be reliable in acutely ill patients when serum creatinine is changing rapidly. It is not useful in  patients on dialysis. The eGFR calculation may not be applicable to patients at the low and high extremes of body sizes, pregnant women, and vegetarians.)  Anion Gap 10    11:40   Result Comment TROPONIN - RESULTS VERIFIED BY REPEAT TESTING.  - C/HEATHER FISHER AT 1227 07/03/13-DAS  - READ-BACK PROCESS PERFORMED.  Result(s) reported on 03 Jul 2013 at 12:30PM.  Cardiac:  15-Apr-15 09:00   Troponin I < 0.02 (0.00-0.05 0.05 ng/mL or less: NEGATIVE  Repeat testing in 3-6 hrs  if clinically indicated. >0.05 ng/mL: POTENTIAL  MYOCARDIAL INJURY. Repeat  testing in 3-6 hrs if  clinically indicated. NOTE: An increase or decrease  of 30% or more on serial  testing suggests a  clinically important change)  CPK-MB, Serum  4.4 (Result(s) reported on 03 Jul 2013 at 01:39PM.)    11:40   Troponin I  3.50 (0.00-0.05 0.05 ng/mL or less: NEGATIVE  Repeat testing in 3-6 hrs  if clinically indicated. >0.05 ng/mL: POTENTIAL  MYOCARDIAL INJURY. Repeat  testing in 3-6 hrs if  clinically indicated. NOTE: An increase or decrease  of 30% or more on serial  testing suggests a  clinically important change)    15:48   Troponin I - (0.00-0.05 0.05 ng/mL or less: NEGATIVE  Repeat testing in 3-6 hrs  if clinically indicated. >0.05 ng/mL: POTENTIAL  MYOCARDIAL INJURY. Repeat  testing in 3-6 hrs if  clinically indicated. NOTE: An increase or decrease  of 30%  or more on serial  testing suggests a  clinically important change)    16:30   Troponin I  6.70 (0.00-0.05 0.05 ng/mL or less: NEGATIVE  Repeat testing in 3-6 hrs  if clinically indicated. >0.05 ng/mL: POTENTIAL  MYOCARDIAL INJURY. Repeat  testing in 3-6 hrs if  clinically indicated. NOTE: An increase or decrease  of 30% or more on serial  testing suggests a  clinically important change)  Routine Coag:  15-Apr-15 09:00   Prothrombin 11.7  INR 0.9 (INR reference interval applies to patients on anticoagulant therapy. A single INR therapeutic range for coumarins is not optimal for all indications; however, the suggested range for most indications is 2.0 - 3.0. Exceptions to the INR Reference Range may include: Prosthetic heart valves, acute myocardial infarction, prevention of myocardial infarction, and combinations of aspirin and anticoagulant. The need for a higher or  lower target INR must be assessed individually. Reference: The Pharmacology and Management of the Vitamin K  antagonists: the seventh ACCP Conference on Antithrombotic and Thrombolytic Therapy. WERXV.4008 Sept:126 (3suppl): [id number]. A HCT value >55% may artifactually increase the PT.  In one study,  the increase was an average of 25%. Reference:  "Effect on Routine and Special Coagulation Testing Values of Citrate Anticoagulant Adjustment in Patients with High HCT Values." American Journal of Clinical Pathology 2006;126:400-405.)  Activated PTT (APTT) 25.2 (A HCT value >55% may artifactually increase the APTT. In one study, the increase was an average of 19%. Reference: "Effect on Routine and Special Coagulation Testing Values of Citrate Anticoagulant Adjustment in Patients with High HCT Values." American Journal of Clinical Pathology 2006;126:400-405.)  Routine Hem:  15-Apr-15 09:00   WBC (CBC) 10.6  RBC (CBC) 4.99  Hemoglobin (CBC) 14.7  Hematocrit (CBC) 44.4  Platelet Count (CBC) 281 (Result(s) reported on 03 Jul 2013 at 10:26AM.)  MCV 89  MCH 29.5  MCHC 33.2  RDW 13.2   EKG:  Interpretation EKG shows supraventricular tachycardia/AVNRT, 182 bpm, diffuse 1-2 mm flat ST depressions, isolated 1-2 mm ST elevation in aVR only   EKG Comparision Changed from  prior tracings   Radiology Results:  XRay:    15-Apr-15 09:13, Chest Portable Single View  Chest Portable Single View   REASON FOR EXAM:    Chest Pain  COMMENTS:       PROCEDURE: DXR - DXR PORTABLE CHEST SINGLE VIEW  - Jul 03 2013  9:13AM     CLINICAL DATA:  Left-sided chest and arm discomfort with  hypertension    EXAM:  PORTABLE CHEST - 1 VIEW    COMPARISON:  DG CHEST 2V dated 03/17/2012    FINDINGS:  The lungs are adequately inflated. Increased conspicuity of the lung  density in the infrahilar region on the right suggests atelectasis.  The cardiopericardial silhouette is mildly enlarged though stable.  The  pulmonary vascularity is not engorged. There is no pleural  effusion or pneumothorax.     IMPRESSION:  Mildly increased right infrahilar lung markings likely reflects  subsegmental atelectasis. There is no evidence of CHF. A followup PA  and lateral chest x-ray would be of value.      Electronically Signed    By: David  Martinique    On: 07/03/2013 09:23     Verified By: DAVID A. Martinique, M.D., MD  Cardiology:    15-Apr-15 14:09, Echo Doppler  Echo Doppler   REASON FOR EXAM:      COMMENTS:       PROCEDURE: [place] - ECHO DOPPLER COMPLETE(TRANSTHOR)  -  Jul 03 2013  2:09PM     RESULT: Echocardiogram Report    Patient Name:   KULLEN TOMASETTI Date of Exam: 07/03/2013  Medical Rec #:  [id number]            Custom1:  Date of Birth:  1955/04/30         Height:       74.0 in  Patient Age:    64 years          Weight:       200.0 lb  Patient Gender: M                 BSA:          2.17 m??    Indications: MI  Sonographer:    Sherrie Sport RDCS  Referring Phys: Hillary Bow, R    Summary:   1. Left ventricular ejection fraction, by visual estimation, is 55 to   60%.   2. Normal global left ventricular systolic function   3. Select images suggesting Inferior wall hypokinesis   4. Mild left ventricular hypertrophy.   5. Diastolic relaxation abnormality noted.   6. Normal right ventricular size and systolic function.   7. Mildly dilated left atrium.   8. Mild mitral valve regurgitation.   9. Mild tricuspid regurgitation.  10. Normal RVSP  2D AND M-MODE MEASUREMENTS(normal ranges within parentheses):  Left Ventricle:          Normal  IVSd (2D):      1.16 cm (0.7-1.1)  LVPWd (2D):     1.21 cm (0.7-1.1) Aorta/LA:                  Normal  LVIDd (2D):     3.85 cm (3.4-5.7) Aortic Root (2D): 3.30 cm (2.4-3.7)  LVIDs(2D):     2.67 cm           Left Atrium (2D): 3.30 cm (1.9-4.0)  LV FS (2D):     30.6 %   (>25%)  LV EF (2D):     58.9 %   (>50%)                                    Right  Ventricle:                                    RVd (2D):        1.19 cm  LV DIASTOLIC FUNCTION:  MV Peak E: 0.56 m/s E/e' Ratio: 9.50  MV Peak A: 0.83 m/s Decel Time: 306 msec  E/A Ratio: 0.67  SPECTRAL DOPPLER ANALYSIS (where applicable):  Mitral Valve:  MV P1/2 Time: 88.74 msec  MV Area, PHT: 2.48 cm??  Aortic Valve: AoV Max Vel: 1.01 m/s AoV Peak PG: 4.1 mmHg AoV Mean PG:  LVOT Vmax: 0.80 m/s LVOT VTI:  LVOT Diameter: 2.10 cm  AoV Area, Vmax: 2.72 cm?? AoV Area, VTI:  AoV Area, Vmn:  Tricuspid Valve and PA/RV Systolic Pressure: TR Max Velocity: 1.87 m/s RA   Pressure: 5 mmHgRVSP/PASP: 19.0 mmHg  Pulmonic Valve:  PV Max Velocity: 0.81 m/s PV Max PG: 2.6 mmHg PV Mean PG:    PHYSICIAN INTERPRETATION:  Left Ventricle: The left ventricular internal cavity size was normal. LV   posterior wall thickness was normal. Mild left ventricular hypertrophy.   Global LV systolic  function was normal. Left ventricular ejection   fraction, by visual estimation, is 55 to 60%.  Right Ventricle: Normal right ventricular size, wall thickness, and   systolic function. The right ventricularsize is normal. Global RV   systolic function is normal.  Left Atrium: The left atrium is mildly dilated.  Right Atrium: The right atrium is normal in size.  Pericardium: There is no evidence of pericardial effusion.  Mitral Valve: The mitral valve is normal in structure. Mild mitral valve   regurgitation is seen.  Tricuspid Valve: The tricuspid valve is normal. Mild tricuspid   regurgitation is visualized. The tricuspid regurgitant velocity is 1.87   m/s, and with an assumed right atrial pressure of 5 mmHg, the estimated   right ventricular systolic pressure is normal at 19.0 mmHg.  Aortic Valve: The aortic valve is normal. The aortic valve is   structurally normal, with no evidence of sclerosis or stenosis. No   evidence of aortic valve regurgitation is seen.  Aorta: The aortic root and ascending aorta are  structurally normal, with   no evidence of dilitation.    [id number] Ida Rogue MD  Electronically signed by [id number] Ida Rogue MD  Signature Date/Time: 07/03/2013/7:05:26 PM    *** Final ***    IMPRESSION: .        Verified By: Minna Merritts, M.D., MD    No Known Allergies:   Vital Signs/Nurse's Notes:  **Vital Signs.:   15-Apr-15 15:01  Vital Signs Type Admission  Temperature Temperature (F) 98.2  Celsius 36.7  Temperature Source oral  Pulse Pulse 73  Respirations Respirations 18  Systolic BP Systolic BP 923  Diastolic BP (mmHg) Diastolic BP (mmHg) 96  Mean BP 118  Pulse Ox % Pulse Ox % 99  Pulse Ox Activity Level  At rest  Oxygen Delivery 2L    Impression 59yo African-American male w/ PMHx s/f CAD (s/p BMS-RCA in 07/2010), DM2, HTN, HLD, hypothyroidism and polysubstance abuse who was admitted to [facility] today for SVT, NSTEMI. Cardiology consulted for the same.   1. NSTEMI-   history of CAD requiring prior PCI. He deferred cardiac cath in 2013 in the setting of hypertensive emergency, but also significant troponin elevation. He has multiple cardiac risk factors, and suspect these may be uncontrolled given that he presented to the ED two days ago with marked hyperglycemia. SVT (rate 300T) certainly created supply-demand mismatch; however, given significant troponin rise, cardiac history and description of unstable angina leading up to this AM, concern is for an underlying flow-limiting lesion. Recommend cardiac catheterization (? LM or 3v disease given isolated ST elevation in aVR). Risks, benefits and details of the procedure have been outlined to the patient who agrees to proceed. He had unfortunately eaten just prior to my interview. He is currently heparinized. Full-dose ASA x 1. Note, he did use cocaine 3 days ago.  -- Plan cardiac cath tomorrow AM (orders placed) -- Continue heparin, low-dose ASA, Effient, BB -- Would add ACE/ARB  -- Switch to high potency statin --  Cocaine cessation strongly advised  2. SVT-  evidenced on EKG. Suspect this unmasked underlying flow-limiting CAD. Question if ischemia induced rhythm or rhythm induced myocardial injury. Agree with increased metoprolol tartrate. Educated on Valsalva maneuvers. -- Continue increased metoprolol -- Monitor rhythm on telemetry -- Could add diltiazem if further episodes of SVT -- Cocaine cessation -- Check TSH, free T4, Mg  3. Hypertension-  SBP 180s in the ED. He had not taken his antihypertensives this AM. --  Monitor BP with current antihypertensive regimen -- Would add ACE/ARB for renal protection given DM2, could hold HCTZ if needed   Plan . 4. Polysubstance abuse- affirms abusing cocaine and marijuana 3 days ago. -- UDS -- Cessation strongly advised  5. Hyperlipidemia -- Upgrade to high-potency atorvastatin 30m or equivalent  6. Type 2 DM- question uncontrolled given recent ED visit for hyperglycemia.  -- Hold metformin pending cath tomorrow -- SSI while inpatient  7. Hypothyroidism -- Check TFTs to r/o thyroid imbalance as a trigger for SVT   Electronic Signatures: Rasheema Truluck A (PA-C)  (Signed 15-Apr-15 15:15)  Authored: General Aspect/Present Illness, History and Physical Exam, Review of System, Home Medications, Labs, EKG , Radiology, Allergies, Impression/Plan GIda Rogue(MD)  (Signed 15-Apr-15 19:25)  Authored: General Aspect/Present Illness, History and Physical Exam, Review of System, Labs, EKG , Radiology, Vital Signs/Nurse's Notes, Impression/Plan  Co-Signer: General Aspect/Present Illness, History and Physical Exam, Review of System, Home Medications, Labs, EKG , Radiology, Allergies, Impression/Plan   Last Updated: 15-Apr-15 19:25 by GIda Rogue(MD)

## 2014-07-12 NOTE — Discharge Summary (Signed)
PATIENT NAME:  Steve Alvarado, Steve Alvarado MR#:  [id number] DATE OF BIRTH:  04-May-1955  DATE OF ADMISSION:  07/03/2013 DATE OF DISCHARGE:  07/04/2013  CONSULTANTS: Dr. Kirke CorinArida and Dr. Mariah MillingGollan from cardiology.  PRIMARY CARE PHYSICIAN: Is at [facility].   CHIEF COMPLAINT: Chest pain and palpitations.   DISCHARGE DIAGNOSES:  1. Supraventricular tachycardia.  2. Non-ST elevation myocardial infarction status post catheterization.  3. Hypertension.  4. Uncontrolled diabetes.  5. Coronary artery disease with significant 2-vessel disease. RCA stents patent with moderate 50% in-stent restenosis. The left circumflex was diffusely diseased before and now is occluded in mid segment.  6. Cocaine abuse.  7. Polysubstance abuse.  8. Hyperlipidemia.   DISCHARGE MEDICATIONS: Effient 10 mg daily, Cozaar 100 mg daily, NovoLog 70/30 at 40 units 2 times a day, amlodipine 5 mg daily, hydrochlorothiazide 25 mg daily, Synthroid 25 mcg daily, metoprolol tartrate 100 mg 2 times a day, aspirin 81 mg daily, atorvastatin 40 mg at bedtime, Imdur 30 mg extended-release once a day in the morning.   DIET: Low sodium, low fat, low cholesterol ADA diet.   ACTIVITY: As tolerated.   FOLLOWUP: Please follow with PCP within 1-2 weeks. Please follow with cardiology in a week. Stop using substances as discussed. If any palpitations, shortness of breath, chest pain or pressure, call 911 right away.   DISPOSITION: Home.   SIGNIFICANT LABS AND IMAGING: Initial troponin negative, 2nd troponin 3.5, 3rd troponin 6.7. Initial CK-MB of 4.4. Next, CK-MB of 18.1. Next one was 15.8. Hemoglobin A1c of 11 and initial sugars of 403, BUN of 19, creatinine 1.06. Cardiac catheterization as above. Echo showing EF of 55%-60% .  Select images suggest inferior wall hypokinesis, mild LV hypertrophy.  HISTORY OF PRESENT ILLNESS AND HOSPITAL COURSE: For full details of H and P, please see the dictation on April 15th, but briefly, this is a 59 year old  patient with coronary artery disease, status post stents in the past, history of noncompliance, and substance abuse who came in with palpitations and some dizziness. In the ER, was found to have an SVT with heart rate of 180s with some chest pain. He was given adenosine which converted to normal sinus rhythm. Next, troponin jumped at 3.4 and hospitalist services were contacted for further evaluation and management. Cardiology was consulted. He was admitted to telemetry and CK-MB and troponins were trended. The patient likely did have a non-ST-elevation MI. He did present with an SVT. He likely did have the SVT in the setting of cocaine abuse as he had used cocaine 3 days past. His metoprolol was increased and he underwent a cardiac catheterization showing a patent RCA stent with moderate restenosis and occluded mid left circumflex artery. Continuing medical therapy and no revascularization was advised at this point. He was started on a statin which he does not appear to be on per his med rec. His Effient was resumed and he was also started on Imdur and along with resumption of his aspirin and Cozaar. He has had no further palpitations or chest pain. His blood sugars have been elevated, however, here, as he just had contrast, was instructed to resume his metformin in about 48 hours. His blood pressure medications have been adjusted with introduction of Imdur and up titration of metoprolol for the SVT. Cocaine cessation was strongly advised by me as well as cardiology.   PHYSICAL EXAMINATION:  VITAL SIGNS: On the day of discharge, temperature is 98.1, pulse rate 64, respiratory rate 18, blood pressure 144/85, oxygen saturation  98% on room air. GENERAL: The patient is a well developed male sitting in a chair, no obvious distress.  HEENT: Normocephalic, atraumatic. Normal S1, S2. No significant murmurs.  LUNGS: Clear.  ABDOMEN: Soft, nontender.  FOLLOWUP: Please follow with Dr. Mariah Milling and your PCP as dictated  above.   TOTAL TIME SPENT:  Is about 33 minutes.   CODE STATUS: The patient is full code.    ____________________________ Krystal Eaton, MD sa:dd D: 07/04/2013 15:38:19 ET T: 07/04/2013 22:50:07 ET JOB#: [id number]  cc: Krystal Eaton, MD, <Dictator> Antonieta Iba, MD  Krystal Eaton MD ELECTRONICALLY SIGNED 07/18/2013 14:27

## 2014-07-12 NOTE — Consult Note (Signed)
Brief Consult Note: Diagnosis: Cocaine dependence, substance-induced mood disorder.   Patient was seen by consultant.   Consult note dictated.   Recommend further assessment or treatment.   Comments: [name] has cocaine problem. He relapsed 1 month ago after 7 years of sobriety. He had heart attack two weeks ago and is still unable to quit. He came for help.  PLAN: 1. The patient is on IVC.  2. We referred hi\m to [facility] residential program.  Electronic Signatures: Kristine LineaPucilowska, Avantae Bither (MD)  (Signed 30-Apr-15 13:11)  Authored: Brief Consult Note   Last Updated: 30-Apr-15 13:11 by Kristine LineaPucilowska, Lovelle Lema (MD)

## 2014-07-12 NOTE — H&P (Signed)
PATIENT NAME:  Steve Alvarado, Steve Alvarado MR#:  [id number] DATE OF BIRTH:  30-Oct-1955  DATE OF ADMISSION:  07/03/2013  PRIMARY CARE PROVIDER: At [facility].   PRIMARY CARDIOLOGIST: Antonieta Ibaimothy J. Gollan, MD  CHIEF COMPLAINT: Chest pain, palpitations.   HISTORY OF PRESENTING ILLNESS: A [age] year old male patient with history of CAD, hypertension and diabetes, noncompliant, presents to the Emergency Room after he was woken up from sleep with left-sided chest pain and had palpitations. The patient also had some dizziness. The patient arrived in the Emergency Room. Here, he was found to have SVT with heart rate in the 180s along with chest pain. Was given adenosine, converted back to normal sinus rhythm, which has been resolving, but his troponin is up to 3.4, and hospitalist team has been consulted to see the patient for further workup and treatment.   Presently, the patient feels comfortable, not in any chest pain. No palpitations. He does feel a little worn out.   The patient was last here in the hospital in July 2013, when he had NSTEMI. He was recommended cardiac catheterization, but he chose medical management at that time. He does follow with Dr. Mariah MillingGollan, but it has been many months since he has seen him.    He works in a [employer] and mentions that this is exertional work, and he gets on-and-off left-sided chest pain with this work, although he has not mentioned this to his doctors.   PAST MEDICAL HISTORY:  1. CAD, with last PCI in 2012 to RCA.  2. Hypertension.  3. Insulin-dependent diabetes mellitus.  4. Hyperlipidemia.   PAST SURGICAL HISTORY: Left hand surgery and cardiac stents.   HOME MEDICATIONS:  1. Insulin 70/30 at 40 units subcutaneous twice a day.  2. Metformin 1000 mg twice a day.  3. Aspirin 81 mg daily.  4. Metoprolol 50 mg b.i.d.  5. Norvasc 5 mg daily. 6. Cozaar 100 mg daily.  7. Crestor 20 mg daily.   ALLERGIES: No known drug allergies.   SOCIAL HISTORY: The patient  does not smoke. No alcohol. No illicit drugs. Works at a [employer].   CODE STATUS: Full code.   FAMILY HISTORY: His brother had diabetes, hypertension, CAD, who passed away recently. Mother deceased in 5460s from MI.  REVIEW OF SYSTEMS:  CONSTITUTIONAL: Complains of fatigue.  EYES: No blurred vision, pain or redness.  ENT: No tinnitus, ear pain or hearing loss.  RESPIRATORY: No cough, wheeze, hemoptysis.  CARDIOVASCULAR: No chest pain, orthopnea, edema.  GASTROINTESTINAL: No nausea, vomiting, diarrhea, abdominal pain.  GENITOURINARY: No dysuria, hematuria or frequency.  ENDOCRINE: No polyuria, nocturia, thyroid problems.  HEMATOLOGIC AND LYMPHATIC: No anemia, easy bruising or bleeding.  INTEGUMENTARY: No acne, rash, lesion.  MUSCULOSKELETAL: No back pain or arthritis.  NEUROLOGICAL: No focal numbness, weakness, seizure.  PSYCHIATRIC: No anxiety or depression.   LABORATORY STUDIES: Show:  Glucose of 403, BUN 19, creatinine 1.06, sodium 132, potassium 4.9, chloride 101. Troponin less than 0.02 and 3.5.  AST, ALT are 93, 86, bilirubin and alkaline phosphatase normal.  WBC 10.6, hemoglobin 14.7, platelets of 281.  INR 0.19 with PTT of 25.2.  EKG shows SVT, rate of 182. I do not see any ST elevation. EKG after conversion to normal sinus rhythm is pending at this time.  Chest x-ray, portable, showed subsegmental atelectasis on the right side. No pulmonary edema. No effusion.   ASSESSMENT AND PLAN:  1. Elevated troponin likely secondary from non-ST-elevation myocardial infarction with his history of coronary artery  disease, and the patient refused cardiac catheterization the last time in the hospital in 2013, but his elevated troponin could also be secondary to supraventricular tachycardia. Will repeat the troponin, and depending on that, the patient's further management. At this time, will start him on heparin, a beta blocker, statin and aspirin. Case discussed with cardiology, who will be  seeing the patient. The patient will be on a telemetry floor. He is high risk for cardiac arrest and death were elevated troponin. He needs close monitoring. Nitroglycerin sublingual p.r.n. The patient will be bedrest with bathroom privileges. I discussed the case with the patient and wife at bedside.  2. Insulin-dependent diabetes mellitus. Blood sugars are elevated at 400, but he has not taken his morning dose of insulin. Will continue his 70/30 at lower dose and put him on sliding scale insulin. Check HbA1c.  3. Supraventricular tachycardia. Will increase his metoprolol to twice a day. Check a TSH, mag level.  4. Hypertension. Continue medications except Cozaar as the patient might need contrast load for his catheterization.  5. Deep vein thrombosis prophylaxis. The patient is on heparin drip.   CODE STATUS: Full code.   TIME SPENT TODAY ON THIS CASE: 50 minutes.    ____________________________ Molinda Bailiff Delayni Streed, MD srs:lb D: 07/03/2013 13:45:52 ET T: 07/03/2013 14:19:30 ET JOB#: [id number]  cc: Wardell Heath R. Teneshia Hedeen, MD, <Dictator> [facility] Antonieta Iba, MD Orie Fisherman MD ELECTRONICALLY SIGNED 07/03/2013 17:52

## 2014-07-12 NOTE — H&P (Signed)
PATIENT NAME:  Steve Alvarado, Steve Alvarado MR#:  [id number] DATE OF BIRTH:  08-26-1955  DATE OF ADMISSION:  07/18/2013  REFERRING PHYSICIAN: Loleta Rose, MD   ATTENDING PHYSICIAN: Kristine Linea, MD   IDENTIFYING DATA: Mr. Strahm is a 59 year old male with history of substance abuse.   CHIEF COMPLAINT: "I relapsed."  HISTORY OF PRESENT ILLNESS:  Mr. Bernhart has been a cocaine user for many years. Seven years ago, he was admitted to [facility] treatment facility and was able to maintain sobriety beautifully. Initially, he was going to AA and NA meetings about 3 years ago he stopped feeling that he kicked the habit. A month or so ago, he relapsed on cocaine, alcohol and cannabis. He has been drinking couple of beers a day but mostly using cocaine. He had a heart attack 2 weeks ago for which he was hospitalized here at [facility]. He says that following the heart attack he will surely stop using drugs, but unfortunately he was unable to do so. He came to the Emergency Room last night in despair and suicidal feeling that he disappointed his family and feeling worthless, hopeless and guilty. He endorses many symptoms of depression with poor sleep, decreased appetite, anhedonia, poor energy and concentration, poor memory, crying spells and thoughts of suicide. They are passive thoughts of not wanting to leave or not being worthy of living. He did not have a specific plan. He would like to go to [facility] again to continue substance abuse treatment. He denies any psychotic symptoms. Denies symptoms suggestive of bipolar mania. He denies heavy alcohol use and does not believe that he will need alcohol detox.   PAST PSYCHIATRIC HISTORY:  Prior admission for substance abuse. He denies ever attempting suicide, but tells me that he was really frightened yesterday with his thoughts of not wanting to press on.   FAMILY PSYCHIATRIC HISTORY: None reported.   PAST MEDICAL HISTORY: Coronary artery disease,  dyslipidemia, hypertension, diabetes, hypothyroidism.   ALLERGIES: No known drug allergies.   MEDICATIONS ON ADMISSION: Metformin 1000 mg twice daily, Imdur 30 mg daily, atorvastatin 40 mg at bedtime, Effient 10 mg daily, metoprolol 100 mg twice daily, aspirin 81 mg daily, Cozaar 100 mg daily, NovoLog 70/30 forty units twice daily, amlodipine 5 mg daily, hydrochlorothiazide 25 mg daily, Synthroid 25 mcg daily.   SOCIAL HISTORY: He lives with his wife and his son. He is unable to work due to medical problems, but does not have disability. He does not have insurance. He goes to [facility] for his medical needs.   REVIEW OF SYSTEMS: CONSTITUTIONAL: No fevers or chills. No weight changes.  EYES: No double or blurred vision.  EARS, NOSE AND THROAT: No hearing loss.  RESPIRATORY: No shortness of breath or cough.  CARDIOVASCULAR: No chest pain or orthopnea.  GASTROINTESTINAL: No abdominal pain, nausea, vomiting or diarrhea.  GENITOURINARY: No incontinence or frequency.  ENDOCRINE: No heat or cold intolerance.  LYMPHATIC: No anemia or easy bruising.  INTEGUMENTARY: No acne or rash.  MUSCULOSKELETAL: No muscle or joint pain.  NEUROLOGIC: No tingling or weakness.  PSYCHIATRIC: See history of present illness for details.   PHYSICAL EXAMINATION: VITAL SIGNS: Blood pressure 130/62, pulse 64, respirations 18, temperature 98.6.  GENERAL: This is a well-developed, middle-aged male in no acute distress.  HEENT: The pupils are equal, round and reactive to light. Sclerae anicteric.  NECK: Supple. No thyromegaly.  LUNGS: Clear to auscultation. No dullness to percussion.  HEART: Regular rhythm and rate. No murmurs,  rubs or gallops.  ABDOMEN: Soft, nontender, nondistended. Positive bowel sounds.  MUSCULOSKELETAL: Normal muscle strength in all extremities.  SKIN: No rashes or bruises.  LYMPHATIC: No cervical adenopathy.  NEUROLOGIC: Cranial nerves II through XII are intact.   LABORATORY DATA:  Chemistries: Blood glucose of 187, BUN 17, creatinine 1.19. Sodium 133, potassium 4. Blood alcohol level zero. LFTs within normal limits except for AST of 45. Cardiac enzymes negative. Urine tox screen positive for cocaine. CBC within normal limits. Urinalysis is not suggestive of urinary tract infection. Serum acetaminophen and salicylates are low.  MENTAL STATUS EXAMINATION ON ADMISSION: The patient is alert and oriented to person, place, time and situation. He is pleasant, polite and cooperative. He is well groomed. He wears a yellow shirt and hospital scrubs. He maintains good eye contact. His speech is soft. Mood is depressed with flat affect. Thought process is logical. He denies thoughts of hurting himself, but came to the hospital last night suicidal. There are no thoughts of hurting others. There are no delusions or paranoia. There are no auditory or visual hallucinations. His cognition is grossly intact. Registration, recall, short and long-term memory are intact. He is a good historian. He is of average intelligence and fund of knowledge. His insight and judgment are poor.  SUICIDE RISK ASSESSMENT ON ADMISSION: This is a patient with history of substance abuse who relapsed on cocaine, alcohol and marijuana a month or so ago and is unable to stop, in spite of developing heart attack secondary to cocaine use. He is at increased risk of suicide.   DIAGNOSES: AXIS I:    Major depressive disorder, cocaine dependence, alcohol abuse, marijuana abuse. AXIS II:   Deferred.  AXIS III:  Coronary artery disease, dyslipidemia, hypertension, hypothyroidism, diabetes.  AXIS IV: Mental and physical illness, access to care, substance use, poor insight into his problems.  AXIS V:  Global assessment of functioning 25.   PLAN: The patient was admitted to [facility] Behavior[facility] Medicine unit for safety, stabilization and medication management. He was initially placed on suicide precautions  and was closely monitored to any unsafe behavior. He underwent full psychiatric and risk assessment. He received pharmacotherapy, individual and group psychotherapy, substance abuse counseling and support from therapeutic milieu. 1.  Suicidal ideation: The patient is able to contract for safety.  2.  Mood:  We will start Prozac.  3.  Substance use:  We will watch for symptoms of alcohol withdrawal. He is referred to [facility] facility but will not be accepted there until Monday of next week, which will be 3 weeks following his recent heart attack.  4.  Medical:  We will continue all medicines as prescribed during discharge from medical floor.  5.  Diabetes:  We will continue insulin and sliding scale insulin with blood glucose monitoring.  DISPOSITION: Likely transfer to [facility].   ____________________________ Ellin GoodieJolanta B. Jennet MaduroPucilowska, MD jbp:ce D: 07/18/2013 16:12:00 ET T: 07/18/2013 17:03:46 ET JOB#: [id number]  cc: Coyle Stordahl B. Jennet MaduroPucilowska, MD, <Dictator> Shari ProwsJOLANTA B Coleson Kant MD ELECTRONICALLY SIGNED 07/27/2013 7:44

## 2014-07-22 ENCOUNTER — Telehealth: Payer: Self-pay | Admitting: *Deleted

## 2014-07-22 NOTE — Telephone Encounter (Signed)
° °  1. Which medications need to be refilled? nitro and plaxiv   2. Which pharmacy is medication to be sent to? [facility] [address]  3. Do they need a 30 day or 90 day supply? 30   4. Would they like a call back once the medication has been sent to the pharmacy? Yes

## 2014-07-23 NOTE — Telephone Encounter (Signed)
Pt has not been seen since 2014 needs appointment.

## 2014-07-23 NOTE — Telephone Encounter (Signed)
Pt is currently incarcerated and will have to [facility]. Pt has not been seen since 2014 and would need appointment.

## 2014-07-28 ENCOUNTER — Telehealth: Payer: Self-pay | Admitting: Cardiovascular Disease

## 2014-07-28 NOTE — Telephone Encounter (Signed)
Patients wife calling back about rx refills.  She is advised that we cannot fill any rx until patient is seen as per previous note.

## 2014-09-28 ENCOUNTER — Emergency Department
Admission: EM | Admit: 2014-09-28 | Discharge: 2014-09-28 | Disposition: A | Discharge status: Home / Self Care | Payer: Self-pay | Attending: Student | Admitting: Student

## 2014-09-28 ENCOUNTER — Encounter: Payer: Self-pay | Admitting: Emergency Medicine

## 2014-09-28 ENCOUNTER — Emergency Department: Payer: Self-pay

## 2014-09-28 DIAGNOSIS — Z794 Long term (current) use of insulin: Secondary | ICD-10-CM | POA: Insufficient documentation

## 2014-09-28 DIAGNOSIS — Z79899 Other long term (current) drug therapy: Secondary | ICD-10-CM | POA: Insufficient documentation

## 2014-09-28 DIAGNOSIS — M79604 Pain in right leg: Secondary | ICD-10-CM

## 2014-09-28 DIAGNOSIS — E119 Type 2 diabetes mellitus without complications: Secondary | ICD-10-CM | POA: Insufficient documentation

## 2014-09-28 DIAGNOSIS — M545 Low back pain, unspecified: Secondary | ICD-10-CM

## 2014-09-28 DIAGNOSIS — I10 Essential (primary) hypertension: Secondary | ICD-10-CM | POA: Insufficient documentation

## 2014-09-28 DIAGNOSIS — Z7982 Long term (current) use of aspirin: Secondary | ICD-10-CM | POA: Insufficient documentation

## 2014-09-28 LAB — CBC WITH DIFFERENTIAL/PLATELET
BASOS PCT: 1 %
Basophils Absolute: 0.1 10*3/uL (ref 0–0.1)
EOS ABS: 0.3 10*3/uL (ref 0–0.7)
Eosinophils Relative: 3 %
HEMATOCRIT: 45.1 % (ref 40.0–52.0)
HEMOGLOBIN: 15.8 g/dL (ref 13.0–18.0)
Lymphocytes Relative: 14 %
Lymphs Abs: 1.8 10*3/uL (ref 1.0–3.6)
MCH: 30.2 pg (ref 26.0–34.0)
MCHC: 34.9 g/dL (ref 32.0–36.0)
MCV: 86.4 fL (ref 80.0–100.0)
MONO ABS: 1.2 10*3/uL — AB (ref 0.2–1.0)
MONOS PCT: 10 %
NEUTROS ABS: 9 10*3/uL — AB (ref 1.4–6.5)
Neutrophils Relative %: 72 %
Platelets: 235 10*3/uL (ref 150–440)
RBC: 5.22 MIL/uL (ref 4.40–5.90)
RDW: 13.1 % (ref 11.5–14.5)
WBC: 12.4 10*3/uL — ABNORMAL HIGH (ref 3.8–10.6)

## 2014-09-28 LAB — BASIC METABOLIC PANEL
ANION GAP: 11 (ref 5–15)
BUN: 26 mg/dL — ABNORMAL HIGH (ref 6–20)
CALCIUM: 9.2 mg/dL (ref 8.9–10.3)
CO2: 26 mmol/L (ref 22–32)
Chloride: 94 mmol/L — ABNORMAL LOW (ref 101–111)
Creatinine, Ser: 1.31 mg/dL — ABNORMAL HIGH (ref 0.61–1.24)
GFR calc Af Amer: >60 mL/min (ref >60)
GFR, EST NON AFRICAN AMERICAN: 58 mL/min — AB (ref >60)
Glucose, Bld: 497 mg/dL — ABNORMAL HIGH (ref 65–99)
Potassium: 4.3 mmol/L (ref 3.5–5.1)
SODIUM: 131 mmol/L — AB (ref 135–145)

## 2014-09-28 LAB — URINALYSIS COMPLETE WITH MICROSCOPIC (ARMC ONLY)
Bacteria, UA: NONE SEEN
Bilirubin Urine: NEGATIVE
HGB URINE DIPSTICK: NEGATIVE
Ketones, ur: NEGATIVE mg/dL
Leukocytes, UA: NEGATIVE
Nitrite: NEGATIVE
PROTEIN: NEGATIVE mg/dL
RBC / HPF: NONE SEEN RBC/hpf (ref 0–5)
SPECIFIC GRAVITY, URINE: 1.03 (ref 1.005–1.030)
pH: 5 (ref 5.0–8.0)
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
## 2014-09-28 MED ORDER — FLUORESCEIN SODIUM 1 MG OP STRP: 1.0000 | ORAL_STRIP | Freq: Once | OPHTHALMIC | Status: DC

## 2014-09-28 MED ORDER — CYCLOBENZAPRINE HCL 5 MG PO TABS: 5.0000 mg | ORAL_TABLET | Freq: Three times a day (TID) | ORAL | Status: DC | PRN

## 2014-09-28 MED ORDER — TETRACAINE HCL 0.5 % OP SOLN: 2.0000 [drp] | Freq: Once | OPHTHALMIC | Status: DC

## 2014-09-28 MED ORDER — ORPHENADRINE CITRATE 30 MG/ML IJ SOLN
60.0000 mg | Freq: Two times a day (BID) | INTRAMUSCULAR | Status: DC
  Administered 2014-09-28: 60 mg via INTRAVENOUS

## 2014-09-28 MED ORDER — HYDROCODONE-ACETAMINOPHEN 5-325 MG PO TABS
1.0000 | ORAL_TABLET | Freq: Once | ORAL | Status: AC
  Administered 2014-09-28: 1 via ORAL
  Filled 2014-09-28: qty 1

## 2014-09-28 MED ORDER — ORPHENADRINE CITRATE 30 MG/ML IJ SOLN
60.0000 mg | INTRAMUSCULAR | Status: DC
  Filled 2014-09-28: qty 2

## 2014-09-28 MED ORDER — HYDROCODONE-ACETAMINOPHEN 5-325 MG PO TABS: 1.0000 | ORAL_TABLET | ORAL | Status: DC | PRN

## 2014-09-28 MED ORDER — ORPHENADRINE CITRATE 30 MG/ML IJ SOLN: 60.0000 mg | Freq: Two times a day (BID) | INTRAMUSCULAR | Status: DC

## 2014-09-28 MED ORDER — KETOROLAC TROMETHAMINE 60 MG/2ML IM SOLN
60.0000 mg | Freq: Once | INTRAMUSCULAR | Status: DC
  Filled 2014-09-28: qty 2

## 2014-09-28 MED ORDER — KETOROLAC TROMETHAMINE 10 MG PO TABS: 10.0000 mg | ORAL_TABLET | Freq: Three times a day (TID) | ORAL | Status: DC

## 2014-09-28 MED ORDER — KETOROLAC TROMETHAMINE 30 MG/ML IJ SOLN
30.0000 mg | Freq: Once | INTRAMUSCULAR | Status: AC
  Administered 2014-09-28: 30 mg via INTRAVENOUS
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
## 2014-09-28 NOTE — ED Notes (Signed)
Back pain x 3 weeks, nausea 3 days. Chronic hand pain that he says he needs surgery on but didn't want the surgery while incarcerated. Needs surgical referral.

## 2014-09-28 NOTE — Discharge Instructions (Signed)
Back Pain, Adult °Low back pain is very common. About 1 in 5 people have back pain. The cause of low back pain is rarely dangerous. The pain often gets better over time. About half of people with a sudden onset of back pain feel better in just 2 weeks. About 8 in 10 people feel better by 6 weeks.  °CAUSES °Some common causes of back pain include: °· Strain of the muscles or ligaments supporting the spine. °· Wear and tear (degeneration) of the spinal discs. °· Arthritis. °· Direct injury to the back. °DIAGNOSIS °Most of the time, the direct cause of low back pain is not known. However, back pain can be treated effectively even when the exact cause of the pain is unknown. Answering your caregiver's questions about your overall health and symptoms is one of the most accurate ways to make sure the cause of your pain is not dangerous. If your caregiver needs more information, he or she may order lab work or imaging tests (X-rays or MRIs). However, even if imaging tests show changes in your back, this usually does not require surgery. °HOME CARE INSTRUCTIONS °For many people, back pain returns. Since low back pain is rarely dangerous, it is often a condition that people can learn to manage on their own.  °· Remain active. It is stressful on the back to sit or stand in one place. Do not sit, drive, or stand in one place for more than 30 minutes at a time. Take short walks on level surfaces as soon as pain allows. Try to increase the length of time you walk each day. °· Do not stay in bed. Resting more than 1 or 2 days can delay your recovery. °· Do not avoid exercise or work. Your body is made to move. It is not dangerous to be active, even though your back may hurt. Your back will likely heal faster if you return to being active before your pain is gone. °· Pay attention to your body when you  bend and lift. Many people have less discomfort when lifting if they bend their knees, keep the load close to their bodies, and  avoid twisting. Often, the most comfortable positions are those that put less stress on your recovering back. °· Find a comfortable position to sleep. Use a firm mattress and lie on your side with your knees slightly bent. If you lie on your back, put a pillow under your knees. °· Only take over-the-counter or prescription medicines as directed by your caregiver. Over-the-counter medicines to reduce pain and inflammation are often the most helpful. Your caregiver may prescribe muscle relaxant drugs. These medicines help dull your pain so you can more quickly return to your normal activities and healthy exercise. °· Put ice on the injured area. °¨ Put ice in a plastic bag. °¨ Place a towel between your skin and the bag. °¨ Leave the ice on for 15-20 minutes, 03-04 times a day for the first 2 to 3 days. After that, ice and heat may be alternated to reduce pain and spasms. °· Ask your caregiver about trying back exercises and gentle massage. This may be of some benefit. °· Avoid feeling anxious or stressed. Stress increases muscle tension and can worsen back pain. It is important to recognize when you are anxious or stressed and learn ways to manage it. Exercise is a great option. °SEEK MEDICAL CARE IF: °· You have pain that is not relieved with rest or medicine. °· You have pain that does not improve in 1 week. °· You have new symptoms. °· You are generally not feeling well. °SEEK   IMMEDIATE MEDICAL CARE IF:   You have pain that radiates from your back into your legs.  You develop new bowel or bladder control problems.  You have unusual weakness or numbness in your arms or legs.  You develop nausea or vomiting.  You develop abdominal pain.  You feel faint. Document Released: 03/07/2005 Document Revised: 09/06/2011 Document Reviewed: 07/09/2013 [facility] Patient Information 2015 [place], [place]. This information is not intended to replace advice given to you by your health care provider. Make sure you  discuss any questions you have with your health care provider.  Take the prescription meds as directed. Apply ice therapy or moist heat as needed. Follow-up with [facility] as planned.

## 2014-10-06 ENCOUNTER — Ambulatory Visit: Payer: Self-pay

## 2014-10-30 ENCOUNTER — Encounter: Payer: Self-pay | Admitting: Emergency Medicine

## 2014-10-30 ENCOUNTER — Emergency Department
Admission: EM | Admit: 2014-10-30 | Discharge: 2014-10-30 | Disposition: A | Discharge status: Home / Self Care | Payer: Medicaid Other | Attending: Emergency Medicine | Admitting: Emergency Medicine

## 2014-10-30 DIAGNOSIS — Z79899 Other long term (current) drug therapy: Secondary | ICD-10-CM | POA: Insufficient documentation

## 2014-10-30 DIAGNOSIS — Z7982 Long term (current) use of aspirin: Secondary | ICD-10-CM | POA: Insufficient documentation

## 2014-10-30 DIAGNOSIS — M5430 Sciatica, unspecified side: Secondary | ICD-10-CM | POA: Insufficient documentation

## 2014-10-30 DIAGNOSIS — Z7902 Long term (current) use of antithrombotics/antiplatelets: Secondary | ICD-10-CM | POA: Insufficient documentation

## 2014-10-30 DIAGNOSIS — I10 Essential (primary) hypertension: Secondary | ICD-10-CM | POA: Insufficient documentation

## 2014-10-30 DIAGNOSIS — E119 Type 2 diabetes mellitus without complications: Secondary | ICD-10-CM | POA: Insufficient documentation

## 2014-10-30 DIAGNOSIS — Z794 Long term (current) use of insulin: Secondary | ICD-10-CM | POA: Insufficient documentation

## 2014-10-30 MED ORDER — CLONIDINE HCL 0.1 MG PO TABS
ORAL_TABLET | ORAL | Status: AC
  Administered 2014-10-30: 0.1 mg via ORAL
  Filled 2014-10-30: qty 1

## 2014-10-30 MED ORDER — KETOROLAC TROMETHAMINE 60 MG/2ML IM SOLN
INTRAMUSCULAR | Status: AC
  Filled 2014-10-30: qty 2

## 2014-10-30 MED ORDER — CLONIDINE HCL 0.1 MG PO TABS
0.1000 mg | ORAL_TABLET | Freq: Once | ORAL | Status: AC
  Administered 2014-10-30: 0.1 mg via ORAL

## 2014-10-30 MED ORDER — OXYCODONE-ACETAMINOPHEN 5-325 MG PO TABS
1.0000 | ORAL_TABLET | Freq: Once | ORAL | Status: AC
  Administered 2014-10-30: 1 via ORAL
  Filled 2014-10-30: qty 1

## 2014-10-30 MED ORDER — CLONIDINE HCL 0.2 MG PO TABS: 0.2000 mg | ORAL_TABLET | Freq: Two times a day (BID) | ORAL | Status: DC

## 2014-10-30 MED ORDER — CYCLOBENZAPRINE HCL 10 MG PO TABS: 10.0000 mg | ORAL_TABLET | Freq: Three times a day (TID) | ORAL | Status: AC | PRN

## 2014-10-30 MED ORDER — KETOROLAC TROMETHAMINE 60 MG/2ML IM SOLN
60.0000 mg | Freq: Once | INTRAMUSCULAR | Status: AC
  Administered 2014-10-30: 60 mg via INTRAMUSCULAR

## 2014-10-30 MED ORDER — AMLODIPINE BESYLATE 10 MG PO TABS: 10.0000 mg | ORAL_TABLET | Freq: Every day | ORAL | Status: DC

## 2014-10-30 NOTE — ED Notes (Signed)
Patient discharged to home per MD order. Patient in stable condition, and deemed medically cleared by ED provider for discharge. Discharge instructions reviewed with patient/family using "Teach Back"; verbalized understanding of medication education and administration, and information about follow-up care. Denies further concerns. ° °

## 2014-10-30 NOTE — ED Notes (Signed)
Patient ambulatory to triage with steady gait, without difficulty or distress noted; pt reports lower back pain x 3wks; st seen here for same and dx with arthritis

## 2014-10-30 NOTE — ED Provider Notes (Signed)
[facility] Emergency Department Provider Note  ____________________________________________  Time seen: 4:00AM  I have reviewed the triage vital signs and the nursing notes.   HISTORY  Chief Complaint Back Pain      HPI Steve Alvarado is a 59 y.o. male presents with chronic low back pain 23 weeks patient admits to history of the same. Patient states that the pain radiates down his bilateral buttocks and legs posteriorly. Patient denies any dysuria no fever no nausea or vomiting. In addition patient noted to be markedly hypertensive on presentation to emergency department blood pressure 213/109. Patient states that he's been out of his blood pressure medications for approximately 2 months.     Past Medical History  Diagnosis Date  . Hypertension   . Coronary artery disease   . Diabetes mellitus     Type II  . Hyperlipidemia     Patient Active Problem List   Diagnosis Date Noted  . HTN (hypertension) 02/14/2011  . CAD (coronary artery disease) 08/13/2010  . Stented coronary artery 08/13/2010  . Diabetes mellitus 08/13/2010  . Hyperlipemia 08/13/2010    Past Surgical History  Procedure Laterality Date  . Hand surgery      left  . Cardiac catheterization  08/06/2010    Bare metal stent placed in RCA.    Current Outpatient Rx  Name  Route  Sig  Dispense  Refill  . EXPIRED: amLODipine (NORVASC) 10 MG tablet   Oral   Take 1 tablet (10 mg total) by mouth daily.   30 tablet   6   . aspirin 81 MG tablet   Oral   Take 81 mg by mouth daily.         . cloNIDine (CATAPRES) 0.2 MG tablet   Oral   Take 1 tablet (0.2 mg total) by mouth 2 (two) times daily.   60 tablet      . cyclobenzaprine (FLEXERIL) 5 MG tablet   Oral   Take 1 tablet (5 mg total) by mouth 3 (three) times daily as needed for muscle spasms.   15 tablet   0   . HYDROcodone-acetaminophen (NORCO) 5-325 MG per tablet   Oral   Take 1 tablet by mouth every 4 (four)  hours as needed for moderate pain.   10 tablet   0   . insulin NPH-insulin regular (NOVOLIN 70/30) (70-30) 100 UNIT/ML injection   Subcutaneous   Inject 30 Units into the skin 2 (two) times daily with a meal.           . isosorbide mononitrate (IMDUR) 30 MG 24 hr tablet      TAKE ONE TABLET TWICE DAILY   60 tablet   3   . ketorolac (TORADOL) 10 MG tablet   Oral   Take 1 tablet (10 mg total) by mouth every 8 (eight) hours.   15 tablet   0   . losartan (COZAAR) 100 MG tablet   Oral   Take 1 tablet (100 mg total) by mouth daily.   90 tablet   3   . metFORMIN (GLUCOPHAGE) 1000 MG tablet   Oral   Take 1 tablet (1,000 mg total) by mouth 2 (two) times daily.   60 tablet   6   . metoprolol tartrate (LOPRESSOR) 50 MG tablet   Oral   Take 1 tablet (50 mg total) by mouth 2 (two) times daily.   60 tablet   11   . nitroGLYCERIN (NITROSTAT) 0.4 MG SL tablet  Sublingual   Place 0.4 mg under the tongue every 5 (five) minutes as needed.           . prasugrel (EFFIENT) 10 MG TABS   Oral   Take 10 mg by mouth daily.         . pravastatin (PRAVACHOL) 40 MG tablet   Oral   Take 1 tablet (40 mg total) by mouth daily.   30 tablet   6     Allergies Review of patient's allergies indicates no known allergies.  Family History  Problem Relation Age of Onset  . Heart attack Mother     Social History Social History  Substance Use Topics  . Smoking status: Never Smoker   . Smokeless tobacco: Never Used  . Alcohol Use: No    Review of Systems  Constitutional: Negative for fever. Eyes: Negative for visual changes. ENT: Negative for sore throat. Cardiovascular: Negative for chest pain. Respiratory: Negative for shortness of breath. Gastrointestinal: Negative for abdominal pain, vomiting and diarrhea. Genitourinary: Negative for dysuria. Musculoskeletal: Positive for back pain Skin: Negative for rash. Neurological: Negative for headaches, focal weakness or  numbness.   10-point ROS otherwise negative.  ____________________________________________   PHYSICAL EXAM:  VITAL SIGNS: ED Triage Vitals  Enc Vitals Group     BP 10/30/14 0333 213/109 mmHg     Pulse Rate 10/30/14 0333 89     Resp 10/30/14 0333 20     Temp 10/30/14 0333 98 F (36.7 C)     Temp src --      SpO2 10/30/14 0333 97 %     Weight 10/30/14 0333 220 lb (99.791 kg)     Height 10/30/14 0333 6\' 2"  (1.88 m)     Head Cir --      Peak Flow --      Pain Score 10/30/14 0352 10     Pain Loc --      Pain Edu? --      Excl. in GC? --      Constitutional: Alert and oriented. Well appearing and in no distress. Eyes: Conjunctivae are normal. PERRL. Normal extraocular movements. ENT   Head: Normocephalic and atraumatic.   Nose: No congestion/rhinnorhea.   Mouth/Throat: Mucous membranes are moist.   Neck: No stridor. Cardiovascular: Normal rate, regular rhythm. Normal and symmetric distal pulses are present in all extremities. No murmurs, rubs, or gallops. Respiratory: Normal respiratory effort without tachypnea nor retractions. Breath sounds are clear and equal bilaterally. No wheezes/rales/rhonchi. Gastrointestinal: Soft and nontender. No distention. There is no CVA tenderness. Genitourinary: deferred Musculoskeletal: Nontender with normal range of motion in all extremities.paraspinal muscle pain with palpation Neurologic:  Normal speech and language. No gross focal neurologic deficits are appreciated. Speech is normal.  Skin:  Skin is warm, dry and intact. No rash noted. Psychiatric: Mood and affect are normal. Speech and behavior are normal. Patient exhibits appropriate insight and judgment.       INITIAL IMPRESSION / ASSESSMENT AND PLAN / ED COURSE  Pertinent labs & imaging results that were available during my care of the patient were reviewed by me and considered in my medical decision making (see chart for details).  Clonidine 0.1 mg  given  ____________________________________________   FINAL CLINICAL IMPRESSION(S) / ED DIAGNOSES  Final diagnoses:  Essential hypertension  Sciatica, unspecified laterality      Darci Current, MD 10/30/14 [phone]

## 2014-10-30 NOTE — ED Notes (Signed)
Pt in with co lower back pain states has chronic arthritis pain has been worse for 2-3 weeks, no recent injury.  Pt states it radiates to buttocks and legs bilat.  Denies any dysuria, states has also been out of bp meds for few months.

## 2014-10-30 NOTE — Discharge Instructions (Signed)
Hypertension °Hypertension, commonly called high blood pressure, is when the force of blood pumping through your arteries is too strong. Your arteries are the blood vessels that carry blood from your heart throughout your body. A blood pressure reading consists of a higher number over a lower number, such as 110/72. The higher number (systolic) is the pressure inside your arteries when your heart pumps. The lower number (diastolic) is the pressure inside your arteries when your heart relaxes. Ideally you want your blood pressure below 120/80. °Hypertension forces your heart to work harder to pump blood. Your arteries may become narrow or stiff. Having hypertension puts you at risk for heart disease, stroke, and other problems.  °RISK FACTORS °Some risk factors for high blood pressure are controllable. Others are not.  °Risk factors you cannot control include:  °· Race. You may be at higher risk if you are African American. °· Age. Risk increases with age. °· Gender. Men are at higher risk than women before age 45 years. After age 65, women are at higher risk than men. °Risk factors you can control include: °· Not getting enough exercise or physical activity. °· Being overweight. °· Getting too much fat, sugar, calories, or salt in your diet. °· Drinking too much alcohol. °SIGNS AND SYMPTOMS °Hypertension does not usually cause signs or symptoms. Extremely high blood pressure (hypertensive crisis) may cause headache, anxiety, shortness of breath, and nosebleed. °DIAGNOSIS  °To check if you have hypertension, your health care provider will measure your blood pressure while you are seated, with your arm held at the level of your heart. It should be measured at least twice using the same arm. Certain conditions can cause a difference in blood pressure between your right and left arms. A blood pressure reading that is higher than normal on one occasion does not mean that you need treatment. If one blood pressure reading  is high, ask your health care provider about having it checked again. °TREATMENT  °Treating high blood pressure includes making lifestyle changes and possibly taking medicine. Living a healthy lifestyle can help lower high blood pressure. You may need to change some of your habits. °Lifestyle changes may include: °· Following the DASH diet. This diet is high in fruits, vegetables, and whole grains. It is low in salt, red meat, and added sugars. °· Getting at least 2½ hours of brisk physical activity every week. °· Losing weight if necessary. °· Not smoking. °· Limiting alcoholic beverages. °· Learning ways to reduce stress. ° If lifestyle changes are not enough to get your blood pressure under control, your health care provider may prescribe medicine. You may need to take more than one. Work closely with your health care provider to understand the risks and benefits. °HOME CARE INSTRUCTIONS °· Have your blood pressure rechecked as directed by your health care provider.   °· Take medicines only as directed by your health care provider. Follow the directions carefully. Blood pressure medicines must be taken as prescribed. The medicine does not work as well when you skip doses. Skipping doses also puts you at risk for problems.   °· Do not smoke.   °· Monitor your blood pressure at home as directed by your health care provider.  °SEEK MEDICAL CARE IF:  °· You think you are having a reaction to medicines taken. °· You have recurrent headaches or feel dizzy. °· You have swelling in your ankles. °· You have trouble with your vision. °SEEK IMMEDIATE MEDICAL CARE IF: °· You develop a severe headache or confusion. °·   You have unusual weakness, numbness, or feel faint.  You have severe chest or abdominal pain.  You vomit repeatedly.  You have trouble breathing. MAKE SURE YOU:   Understand these instructions.  Will watch your condition.  Will get help right away if you are not doing well or get worse. Document  Released: 03/07/2005 Document Revised: 07/22/2013 Document Reviewed: 12/28/2012 [facility] Patient Information 2015 [place], [place]. This information is not intended to replace advice given to you by your health care provider. Make sure you discuss any questions you have with your health care provider.  Sciatica Sciatica is pain, weakness, numbness, or tingling along the path of the sciatic nerve. The nerve starts in the lower back and runs down the back of each leg. The nerve controls the muscles in the lower leg and in the back of the knee, while also providing sensation to the back of the thigh, lower leg, and the sole of your foot. Sciatica is a symptom of another medical condition. For instance, nerve damage or certain conditions, such as a herniated disk or bone spur on the spine, pinch or put pressure on the sciatic nerve. This causes the pain, weakness, or other sensations normally associated with sciatica. Generally, sciatica only affects one side of the body. CAUSES   Herniated or slipped disc.  Degenerative disk disease.  A pain disorder involving the narrow muscle in the buttocks (piriformis syndrome).  Pelvic injury or fracture.  Pregnancy.  Tumor (rare). SYMPTOMS  Symptoms can vary from mild to very severe. The symptoms usually travel from the low back to the buttocks and down the back of the leg. Symptoms can include:  Mild tingling or dull aches in the lower back, leg, or hip.  Numbness in the back of the calf or sole of the foot.  Burning sensations in the lower back, leg, or hip.  Sharp pains in the lower back, leg, or hip.  Leg weakness.  Severe back pain inhibiting movement. These symptoms may get worse with coughing, sneezing, laughing, or prolonged sitting or standing. Also, being overweight may worsen symptoms. DIAGNOSIS  Your caregiver will perform a physical exam to look for common symptoms of sciatica. He or she may ask you to do certain movements or  activities that would trigger sciatic nerve pain. Other tests may be performed to find the cause of the sciatica. These may include:  Blood tests.  X-rays.  Imaging tests, such as an MRI or CT scan. TREATMENT  Treatment is directed at the cause of the sciatic pain. Sometimes, treatment is not necessary and the pain and discomfort goes away on its own. If treatment is needed, your caregiver may suggest:  Over-the-counter medicines to relieve pain.  Prescription medicines, such as anti-inflammatory medicine, muscle relaxants, or narcotics.  Applying heat or ice to the painful area.  Steroid injections to lessen pain, irritation, and inflammation around the nerve.  Reducing activity during periods of pain.  Exercising and stretching to strengthen your abdomen and improve flexibility of your spine. Your caregiver may suggest losing weight if the extra weight makes the back pain worse.  Physical therapy.  Surgery to eliminate what is pressing or pinching the nerve, such as a bone spur or part of a herniated disk. HOME CARE INSTRUCTIONS   Only take over-the-counter or prescription medicines for pain or discomfort as directed by your caregiver.  Apply ice to the affected area for 20 minutes, 3-4 times a day for the first 48-72 hours. Then try heat in  the same way.  Exercise, stretch, or perform your usual activities if these do not aggravate your pain.  Attend physical therapy sessions as directed by your caregiver.  Keep all follow-up appointments as directed by your caregiver.  Do not wear high heels or shoes that do not provide proper support.  Check your mattress to see if it is too soft. A firm mattress may lessen your pain and discomfort. SEEK IMMEDIATE MEDICAL CARE IF:   You lose control of your bowel or bladder (incontinence).  You have increasing weakness in the lower back, pelvis, buttocks, or legs.  You have redness or swelling of your back.  You have a burning  sensation when you urinate.  You have pain that gets worse when you lie down or awakens you at night.  Your pain is worse than you have experienced in the past.  Your pain is lasting longer than 4 weeks.  You are suddenly losing weight without reason. MAKE SURE YOU:  Understand these instructions.  Will watch your condition.  Will get help right away if you are not doing well or get worse. Document Released: 03/01/2001 Document Revised: 09/06/2011 Document Reviewed: 07/17/2011 [facility] Patient Information 2015 [place], [place]. This information is not intended to replace advice given to you by your health care provider. Make sure you discuss any questions you have with your health care provider.

## 2014-11-07 ENCOUNTER — Emergency Department
Admission: EM | Admit: 2014-11-07 | Discharge: 2014-11-07 | Discharge status: Against Medical Advice | Payer: Medicaid Other | Attending: Emergency Medicine | Admitting: Emergency Medicine

## 2014-11-07 ENCOUNTER — Encounter: Payer: Self-pay | Admitting: Emergency Medicine

## 2014-11-07 DIAGNOSIS — M79641 Pain in right hand: Secondary | ICD-10-CM | POA: Insufficient documentation

## 2014-11-07 DIAGNOSIS — M545 Low back pain: Secondary | ICD-10-CM | POA: Insufficient documentation

## 2014-11-07 DIAGNOSIS — E119 Type 2 diabetes mellitus without complications: Secondary | ICD-10-CM | POA: Insufficient documentation

## 2014-11-07 DIAGNOSIS — I10 Essential (primary) hypertension: Secondary | ICD-10-CM | POA: Insufficient documentation

## 2014-11-07 DIAGNOSIS — M79651 Pain in right thigh: Secondary | ICD-10-CM | POA: Insufficient documentation

## 2014-11-07 DIAGNOSIS — M79642 Pain in left hand: Secondary | ICD-10-CM | POA: Insufficient documentation

## 2014-11-07 NOTE — ED Notes (Signed)
Pt leaving because of wait time , going to another hospital

## 2014-11-07 NOTE — ED Notes (Signed)
Lower back for over a month , right thigh pain , bilateral hand pain , seen here and at [facility] recently. No injury recalled

## 2014-11-26 ENCOUNTER — Ambulatory Visit: Payer: Self-pay | Admitting: Internal Medicine

## 2014-12-02 ENCOUNTER — Other Ambulatory Visit: Payer: Self-pay

## 2014-12-10 ENCOUNTER — Ambulatory Visit: Payer: Self-pay | Admitting: Internal Medicine

## 2014-12-16 ENCOUNTER — Ambulatory Visit: Payer: Medicaid Other

## 2014-12-22 ENCOUNTER — Emergency Department
Admission: EM | Admit: 2014-12-22 | Discharge: 2014-12-22 | Disposition: A | Discharge status: Home / Self Care | Payer: Medicaid Other | Attending: Emergency Medicine | Admitting: Emergency Medicine

## 2014-12-22 ENCOUNTER — Encounter: Payer: Self-pay | Admitting: Emergency Medicine

## 2014-12-22 ENCOUNTER — Emergency Department: Payer: Medicaid Other

## 2014-12-22 DIAGNOSIS — W1839XA Other fall on same level, initial encounter: Secondary | ICD-10-CM | POA: Insufficient documentation

## 2014-12-22 DIAGNOSIS — S7011XA Contusion of right thigh, initial encounter: Secondary | ICD-10-CM | POA: Insufficient documentation

## 2014-12-22 DIAGNOSIS — Z79899 Other long term (current) drug therapy: Secondary | ICD-10-CM | POA: Insufficient documentation

## 2014-12-22 DIAGNOSIS — E119 Type 2 diabetes mellitus without complications: Secondary | ICD-10-CM | POA: Insufficient documentation

## 2014-12-22 DIAGNOSIS — Z791 Long term (current) use of non-steroidal anti-inflammatories (NSAID): Secondary | ICD-10-CM | POA: Insufficient documentation

## 2014-12-22 DIAGNOSIS — Y9289 Other specified places as the place of occurrence of the external cause: Secondary | ICD-10-CM | POA: Insufficient documentation

## 2014-12-22 DIAGNOSIS — I10 Essential (primary) hypertension: Secondary | ICD-10-CM | POA: Insufficient documentation

## 2014-12-22 DIAGNOSIS — Z7982 Long term (current) use of aspirin: Secondary | ICD-10-CM | POA: Insufficient documentation

## 2014-12-22 DIAGNOSIS — Y998 Other external cause status: Secondary | ICD-10-CM | POA: Insufficient documentation

## 2014-12-22 DIAGNOSIS — M47816 Spondylosis without myelopathy or radiculopathy, lumbar region: Secondary | ICD-10-CM

## 2014-12-22 DIAGNOSIS — S7001XA Contusion of right hip, initial encounter: Secondary | ICD-10-CM

## 2014-12-22 DIAGNOSIS — Y9389 Activity, other specified: Secondary | ICD-10-CM | POA: Insufficient documentation

## 2014-12-22 DIAGNOSIS — Z794 Long term (current) use of insulin: Secondary | ICD-10-CM | POA: Insufficient documentation

## 2014-12-22 IMAGING — CR DG HIP (WITH OR WITHOUT PELVIS) 2-3V*R*
3 series · 3 of 3 positions shown · non-contrast
Comparison: None.

CLINICAL DATA: Approximate 2 month history of right hip pain.
Patient fell earlier today when leaving the cafeteria and now
complains of posterior and lateral hip pain radiating into the lower
leg. Patient unable to bear weight without pain. Initial encounter.

EXAM:
DG HIP (WITH OR WITHOUT PELVIS) 2-3V RIGHT

[pelvis ap]
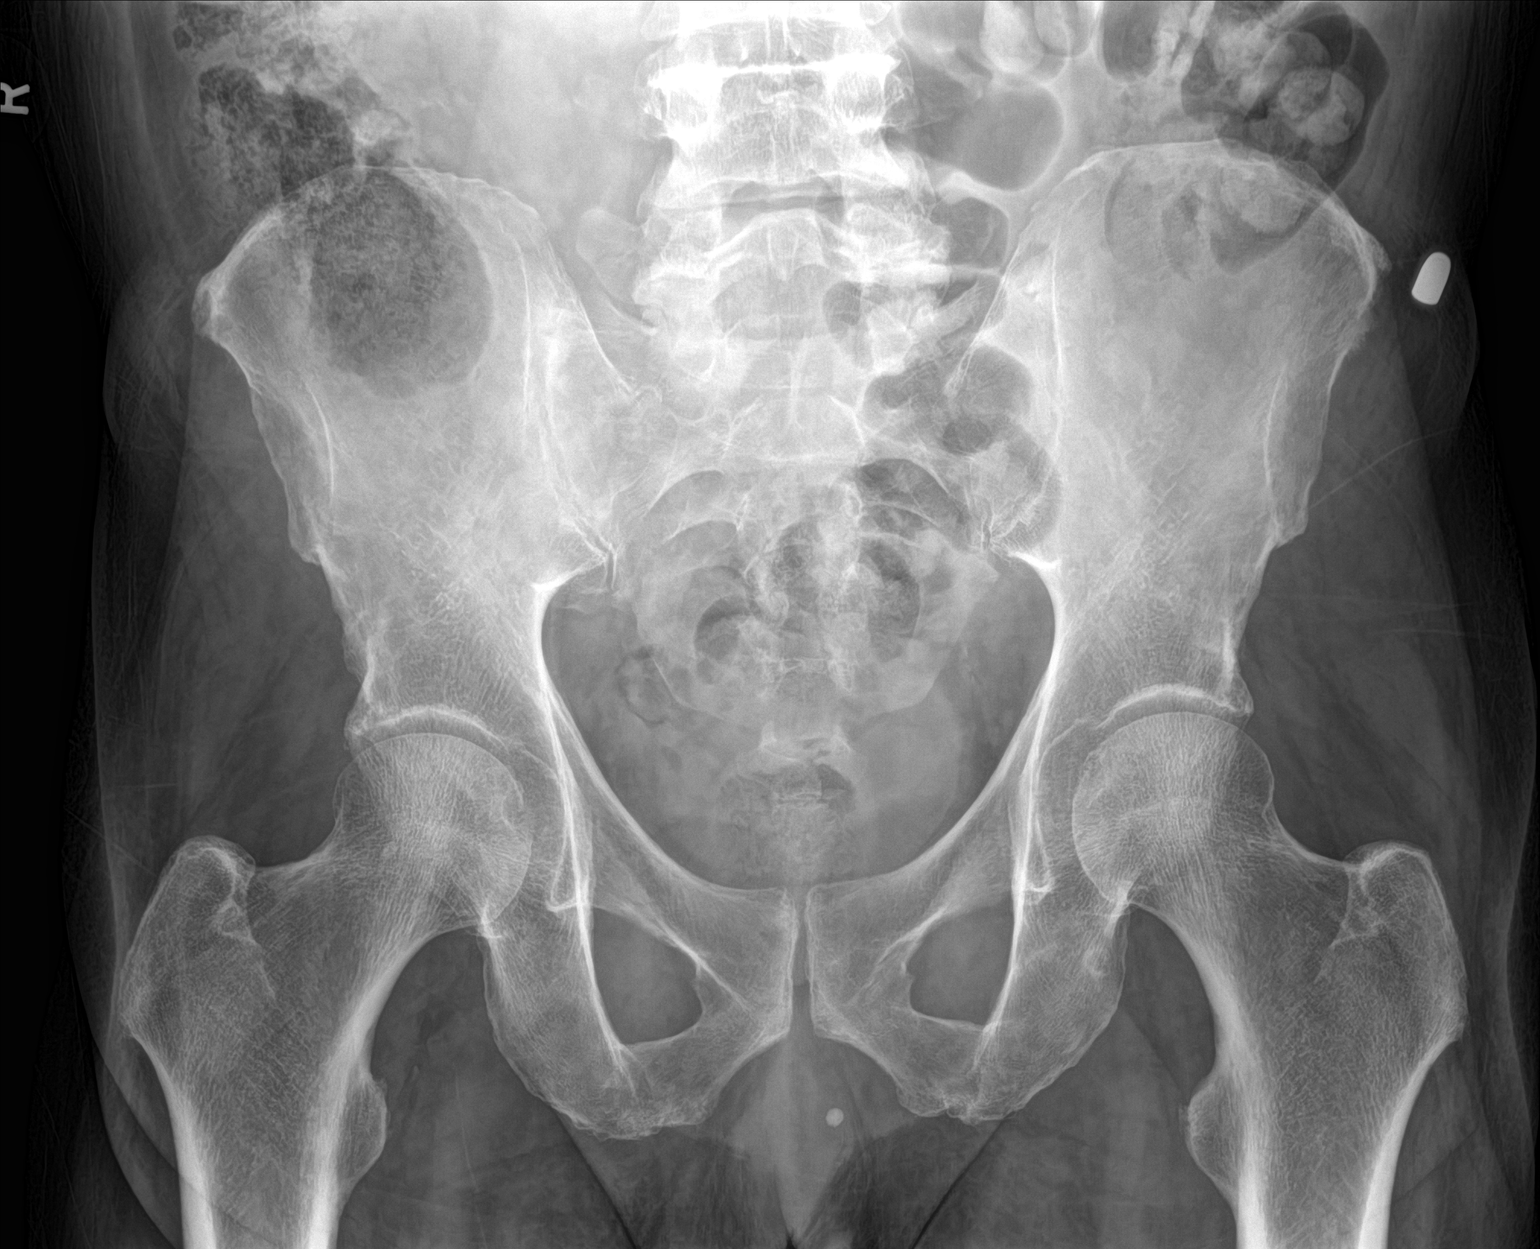

[hip ap]
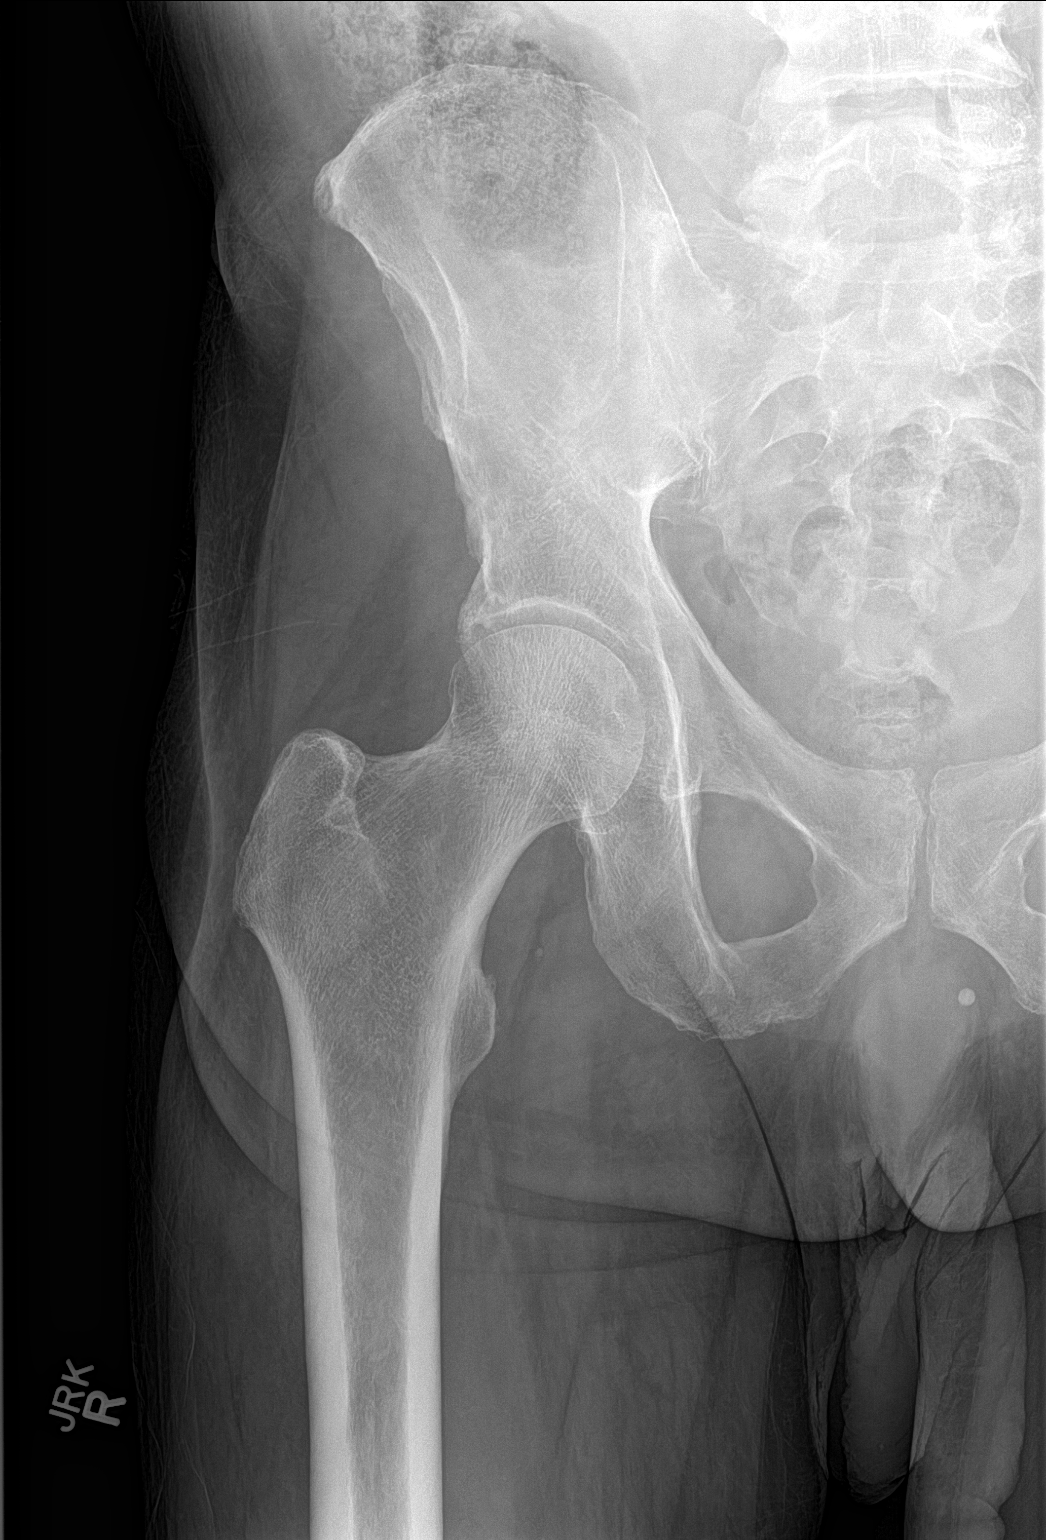

[hip lat]
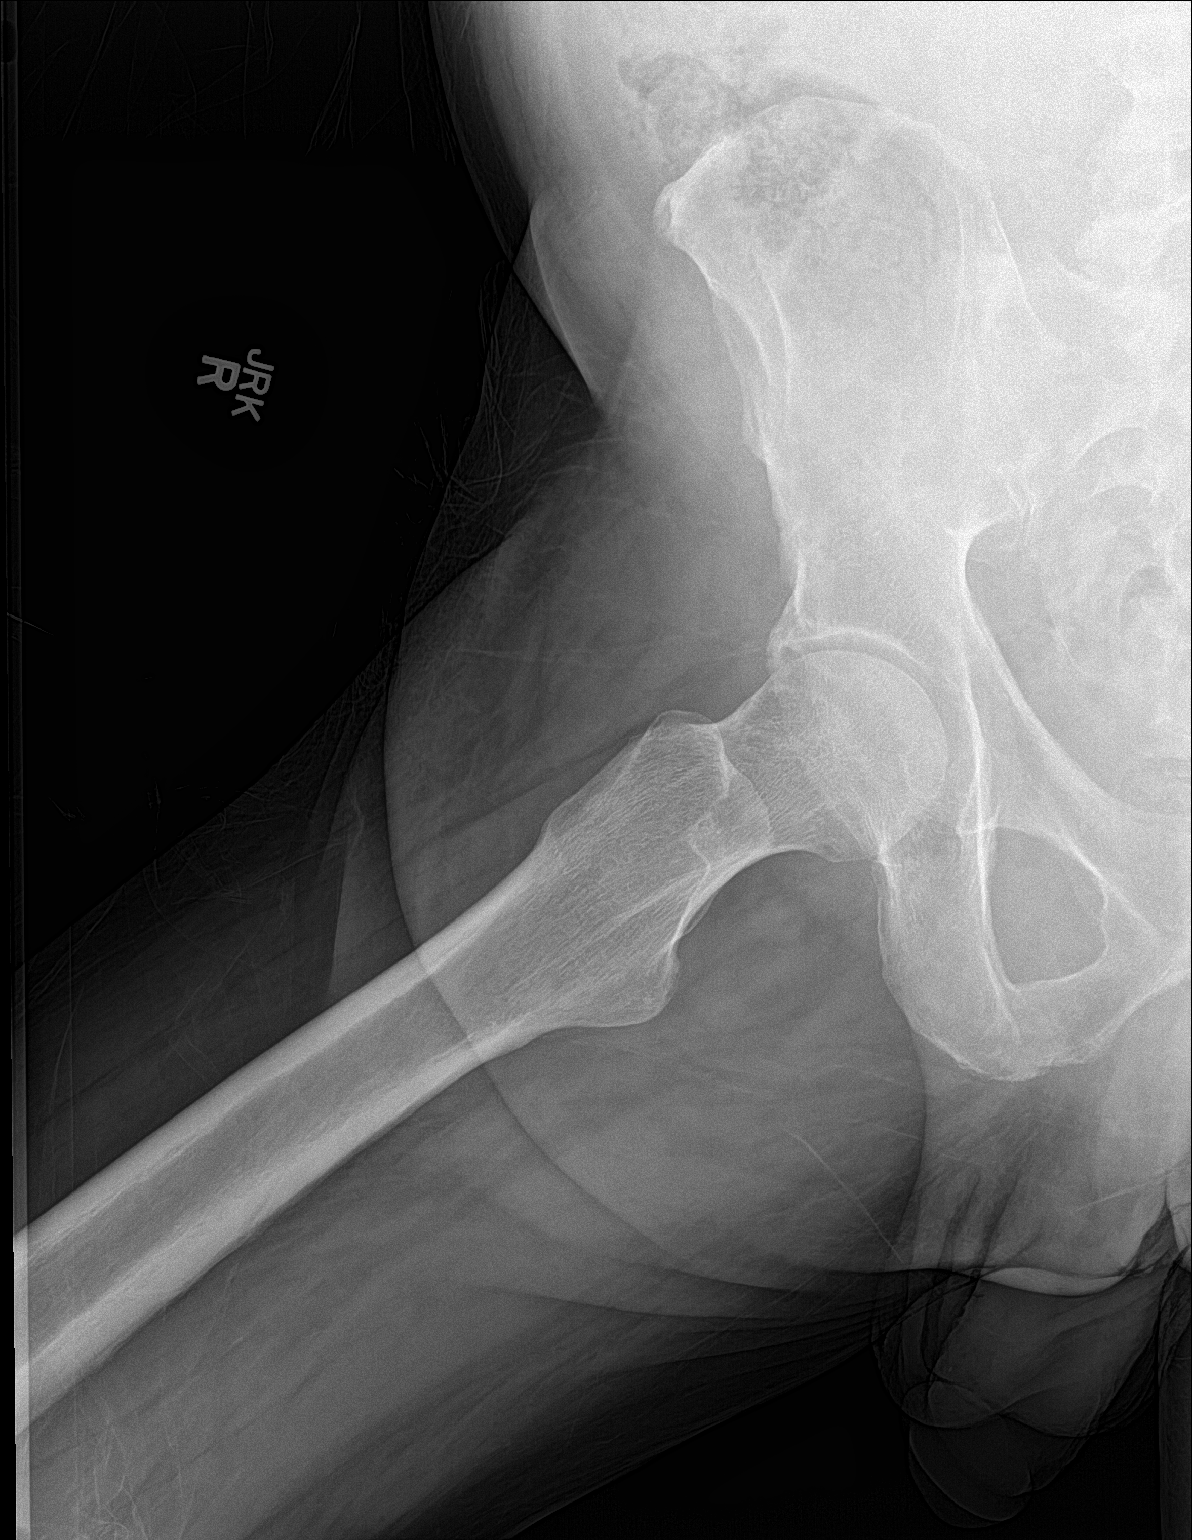

[3 of 3 positions shown; findings below may reference images not displayed]

FINDINGS: No evidence of acute fracture or dislocation. Joint space well
preserved for age. Bone mineral density well-preserved.

Included AP pelvis demonstrates asymmetric well-preserved joint
space in the contralateral left hip. Sacroiliac joints and symphysis
pubis intact. Mild degenerative changes involving the visualized
lower lumbar spine.
IMPRESSION: 1. No acute osseous abnormality.
2. Well-preserved symmetric joint spaces in both hips.
3. Mild degenerative changes involving the visualized lower lumbar
spine.

## 2014-12-22 MED ORDER — OXYCODONE-ACETAMINOPHEN 5-325 MG PO TABS: 1.0000 | ORAL_TABLET | Freq: Four times a day (QID) | ORAL | Status: DC | PRN

## 2014-12-22 MED ORDER — MELOXICAM 15 MG PO TABS: 15.0000 mg | ORAL_TABLET | Freq: Every day | ORAL | Status: DC

## 2014-12-22 MED ORDER — OXYCODONE-ACETAMINOPHEN 5-325 MG PO TABS
1.0000 | ORAL_TABLET | Freq: Once | ORAL | Status: AC
  Administered 2014-12-22: 1 via ORAL
  Filled 2014-12-22: qty 1

## 2014-12-22 NOTE — Discharge Instructions (Signed)
Contusion °A contusion is a deep bruise. Contusions are the result of an injury that caused bleeding under the skin. The contusion may turn blue, purple, or yellow. Minor injuries will give you a painless contusion, but more severe contusions may stay painful and swollen for a few weeks.  °CAUSES  °A contusion is usually caused by a blow, trauma, or direct force to an area of the body. °SYMPTOMS  °· Swelling and redness of the injured area. °· Bruising of the injured area. °· Tenderness and soreness of the injured area. °· Pain. °DIAGNOSIS  °The diagnosis can be made by taking a history and physical exam. An X-ray, CT scan, or MRI may be needed to determine if there were any associated injuries, such as fractures. °TREATMENT  °Specific treatment will depend on what area of the body was injured. In general, the best treatment for a contusion is resting, icing, elevating, and applying cold compresses to the injured area. Over-the-counter medicines may also be recommended for pain control. Ask your caregiver what the best treatment is for your contusion. °HOME CARE INSTRUCTIONS  °· Put ice on the injured area. °¨ Put ice in a plastic bag. °¨ Place a towel between your skin and the bag. °¨ Leave the ice on for 15-20 minutes, 3-4 times a day, or as directed by your health care provider. °· Only take over-the-counter or prescription medicines for pain, discomfort, or fever as directed by your caregiver. Your caregiver may recommend avoiding anti-inflammatory medicines (aspirin, ibuprofen, and naproxen) for 48 hours because these medicines may increase bruising. °· Rest the injured area. °· If possible, elevate the injured area to reduce swelling. °SEEK IMMEDIATE MEDICAL CARE IF:  °· You have increased bruising or swelling. °· You have pain that is getting worse. °· Your swelling or pain is not relieved with medicines. °MAKE SURE YOU:  °· Understand these instructions. °· Will watch your condition. °· Will get help right  away if you are not doing well or get worse. °Document Released: 12/15/2004 Document Revised: 03/12/2013 Document Reviewed: 01/10/2011 °[employer]® Patient Information ©2015 [employer]. This information is not intended to replace advice given to you by your health care provider. Make sure you discuss any questions you have with your health care provider. ° °

## 2014-12-22 NOTE — ED Provider Notes (Signed)
[facility] Emergency Department Provider Note  ____________________________________________  Time seen: Approximately 7:37 PM  I have reviewed the triage vital signs and the nursing notes.   HISTORY  Chief Complaint Fall    HPI KION HUNTSBERRY is a 59 y.o. male since emergency department complaining of right hip pain. He states that he was walking when his "legs just gave out underneath me." He states that he "crumpled" more than fell and did not strike his head. He is not complaining of neck or back pain at this time. He is complaining of lateral right hip pain. Denies any numbness or tingling distal right lower extremity. He is able to ambulate on same. He reports the pain is severe, constant, worse with weightbearing.   Past Medical History  Diagnosis Date  . Hypertension   . Coronary artery disease   . Diabetes mellitus     Type II  . Hyperlipidemia     Patient Active Problem List   Diagnosis Date Noted  . HTN (hypertension) 02/14/2011  . CAD (coronary artery disease) 08/13/2010  . Stented coronary artery 08/13/2010  . Diabetes mellitus (HCC) 08/13/2010  . Hyperlipemia 08/13/2010    Past Surgical History  Procedure Laterality Date  . Hand surgery      left  . Cardiac catheterization  08/06/2010    Bare metal stent placed in RCA.    Current Outpatient Rx  Name  Route  Sig  Dispense  Refill  . amLODipine (NORVASC) 10 MG tablet   Oral   Take 1 tablet (10 mg total) by mouth daily.   30 tablet   6   . aspirin 81 MG tablet   Oral   Take 81 mg by mouth daily.         . cloNIDine (CATAPRES) 0.2 MG tablet   Oral   Take 1 tablet (0.2 mg total) by mouth 2 (two) times daily.   60 tablet   0   . cyclobenzaprine (FLEXERIL) 10 MG tablet   Oral   Take 1 tablet (10 mg total) by mouth every 8 (eight) hours as needed for muscle spasms.   30 tablet   1   . cyclobenzaprine (FLEXERIL) 5 MG tablet   Oral   Take 1 tablet (5 mg total) by  mouth 3 (three) times daily as needed for muscle spasms.   15 tablet   0   . HYDROcodone-acetaminophen (NORCO) 5-325 MG per tablet   Oral   Take 1 tablet by mouth every 4 (four) hours as needed for moderate pain.   10 tablet   0   . insulin NPH-insulin regular (NOVOLIN 70/30) (70-30) 100 UNIT/ML injection   Subcutaneous   Inject 30 Units into the skin 2 (two) times daily with a meal.           . isosorbide mononitrate (IMDUR) 30 MG 24 hr tablet      TAKE ONE TABLET TWICE DAILY   60 tablet   3   . losartan (COZAAR) 100 MG tablet   Oral   Take 1 tablet (100 mg total) by mouth daily.   90 tablet   3   . meloxicam (MOBIC) 15 MG tablet   Oral   Take 1 tablet (15 mg total) by mouth daily.   30 tablet   0   . metFORMIN (GLUCOPHAGE) 1000 MG tablet   Oral   Take 1 tablet (1,000 mg total) by mouth 2 (two) times daily.   60 tablet  6   . metoprolol tartrate (LOPRESSOR) 50 MG tablet   Oral   Take 1 tablet (50 mg total) by mouth 2 (two) times daily.   60 tablet   11   . nitroGLYCERIN (NITROSTAT) 0.4 MG SL tablet   Sublingual   Place 0.4 mg under the tongue every 5 (five) minutes as needed.           Marland Kitchen oxyCODONE-acetaminophen (ROXICET) 5-325 MG tablet   Oral   Take 1 tablet by mouth every 6 (six) hours as needed for severe pain.   20 tablet   0   . prasugrel (EFFIENT) 10 MG TABS   Oral   Take 10 mg by mouth daily.         . pravastatin (PRAVACHOL) 40 MG tablet   Oral   Take 1 tablet (40 mg total) by mouth daily.   30 tablet   6     Allergies Review of patient's allergies indicates no known allergies.  Family History  Problem Relation Age of Onset  . Heart attack Mother     Social History Social History  Substance Use Topics  . Smoking status: Never Smoker   . Smokeless tobacco: Never Used  . Alcohol Use: No    Review of Systems Constitutional: No fever/chills Eyes: No visual changes. ENT: No sore throat. Cardiovascular: Denies chest  pain. Respiratory: Denies shortness of breath. Gastrointestinal: No abdominal pain.  No nausea, no vomiting.  No diarrhea.  No constipation. Genitourinary: Negative for dysuria. Musculoskeletal: Negative for back pain. Positive for right hip pain. Skin: Negative for rash. Neurological: Negative for headaches, focal weakness or numbness.  10-point ROS otherwise negative.  ____________________________________________   PHYSICAL EXAM:  VITAL SIGNS: ED Triage Vitals  Enc Vitals Group     BP 12/22/14 1858 151/91 mmHg     Pulse Rate 12/22/14 1858 92     Resp 12/22/14 1858 18     Temp 12/22/14 1858 97.8 F (36.6 C)     Temp Source 12/22/14 1858 Oral     SpO2 12/22/14 1858 98 %     Weight 12/22/14 1858 200 lb (90.719 kg)     Height 12/22/14 1858  (1.88 m)     Head Cir --      Peak Flow --      Pain Score --      Pain Loc --      Pain Edu? --      Excl. in GC? --     Constitutional: Alert and oriented. Well appearing and in no acute distress. Eyes: Conjunctivae are normal. PERRL. EOMI. Head: Atraumatic. Nose: No congestion/rhinnorhea. Mouth/Throat: Mucous membranes are moist.  Oropharynx non-erythematous. Neck: No stridor.  No C-spine tenderness to palpation Cardiovascular: Normal rate, regular rhythm. Grossly normal heart sounds.  Good peripheral circulation. Respiratory: Normal respiratory effort.  No retractions. Lungs CTAB. Gastrointestinal: Soft and nontender. No distention. No abdominal bruits. No CVA tenderness. Musculoskeletal: No visible deformity to right hip. No ecchymosis, contusion, laceration, abrasion noted. Patient is tender to palpation over greater trochanter region and proximal femur. Range of motion still intact but patient endorses pain with movement. Length of the leg is not sure when compared to the left. No tenderness to palpation to right knee.  No joint effusions. Neurologic:  Normal speech and language. No gross focal neurologic deficits are  appreciated. No gait instability. Skin:  Skin is warm, dry and intact. No rash noted. Psychiatric: Mood and affect are normal. Speech and behavior  are normal.  ____________________________________________   LABS (all labs ordered are listed, but only abnormal results are displayed)  Labs Reviewed - No data to display ____________________________________________  EKG   ____________________________________________  RADIOLOGY  Right hip x-ray Impression: No acute osseous abnormality. Well preserved symmetric joint spaces of both hips. Mild degenerative changes involving the visualized lower lumbar spine. ____________________________________________   PROCEDURES  Procedure(s) performed: None  Critical Care performed: No  ____________________________________________   INITIAL IMPRESSION / ASSESSMENT AND PLAN / ED COURSE  Pertinent labs & imaging results that were available during my care of the patient were reviewed by me and considered in my medical decision making (see chart for details).  The patient is a 59 year old male who presents to the emergency department with right hip pain status post a fall today. Patient's history, symptoms, exam, and radiological findings are consistent with contusion to right hip. Prescribing a limited amount of Percocet for pain and then place patient on meloxicam for improvement of symptoms. Advised patient to follow up with orthopedics if symptoms do not improve after treatment course. Patient verbalizes treatment plan and verbalizes compliance. ____________________________________________   FINAL CLINICAL IMPRESSION(S) / ED DIAGNOSES  Final diagnoses:  Contusion, hip and thigh, right, initial encounter  Lumbar spondylosis, unspecified spinal osteoarthritis      Racheal Patches, PA-C 12/22/14 2019  Governor Rooks, MD 12/23/14 [phone]

## 2014-12-22 NOTE — ED Notes (Signed)
Larey Seat today, right hip pain.  Ambulates well.

## 2014-12-22 NOTE — ED Notes (Signed)

## 2014-12-22 NOTE — ED Notes (Signed)
Patient c/o right hip pain since today, reports "my leg gave out and I fell." No obvious deformity noted, patient ambulating with steady gait. Patient denies hitting head or LOC, Denies other complaints. Patient alert and oriented x 4, respirations even and unlabored.

## 2014-12-31 ENCOUNTER — Ambulatory Visit: Payer: Medicaid Other | Admitting: Cardiovascular Disease

## 2014-12-31 ENCOUNTER — Other Ambulatory Visit: Payer: Self-pay

## 2015-01-07 ENCOUNTER — Ambulatory Visit: Payer: Self-pay | Admitting: Internal Medicine

## 2015-01-14 ENCOUNTER — Ambulatory Visit: Payer: Self-pay | Admitting: Internal Medicine

## 2015-01-19 ENCOUNTER — Encounter (INDEPENDENT_AMBULATORY_CARE_PROVIDER_SITE_OTHER): Payer: Self-pay

## 2015-01-19 ENCOUNTER — Ambulatory Visit: Payer: Medicaid Other

## 2015-02-19 ENCOUNTER — Ambulatory Visit: Payer: Medicaid Other | Admitting: Cardiovascular Disease

## 2015-03-03 ENCOUNTER — Encounter: Payer: Self-pay | Admitting: Cardiovascular Disease

## 2015-03-03 ENCOUNTER — Ambulatory Visit: Payer: Medicaid Other | Admitting: Cardiovascular Disease

## 2015-03-03 DIAGNOSIS — R0989 Other specified symptoms and signs involving the circulatory and respiratory systems: Secondary | ICD-10-CM

## 2015-03-06 DIAGNOSIS — Z79899 Other long term (current) drug therapy: Secondary | ICD-10-CM | POA: Diagnosis not present

## 2015-03-06 DIAGNOSIS — T6291XA Toxic effect of unspecified noxious substance eaten as food, accidental (unintentional), initial encounter: Principal | ICD-10-CM | POA: Insufficient documentation

## 2015-03-06 DIAGNOSIS — Z9114 Patient's other noncompliance with medication regimen: Secondary | ICD-10-CM | POA: Insufficient documentation

## 2015-03-06 DIAGNOSIS — A059 Bacterial foodborne intoxication, unspecified: Secondary | ICD-10-CM | POA: Insufficient documentation

## 2015-03-06 DIAGNOSIS — J9811 Atelectasis: Secondary | ICD-10-CM | POA: Insufficient documentation

## 2015-03-06 DIAGNOSIS — R197 Diarrhea, unspecified: Secondary | ICD-10-CM | POA: Insufficient documentation

## 2015-03-06 DIAGNOSIS — K521 Toxic gastroenteritis and colitis: Secondary | ICD-10-CM | POA: Insufficient documentation

## 2015-03-06 DIAGNOSIS — Z7982 Long term (current) use of aspirin: Secondary | ICD-10-CM | POA: Insufficient documentation

## 2015-03-06 DIAGNOSIS — I10 Essential (primary) hypertension: Secondary | ICD-10-CM | POA: Diagnosis not present

## 2015-03-06 DIAGNOSIS — E1165 Type 2 diabetes mellitus with hyperglycemia: Secondary | ICD-10-CM | POA: Diagnosis not present

## 2015-03-06 DIAGNOSIS — K760 Fatty (change of) liver, not elsewhere classified: Secondary | ICD-10-CM | POA: Diagnosis not present

## 2015-03-06 DIAGNOSIS — R739 Hyperglycemia, unspecified: Secondary | ICD-10-CM | POA: Diagnosis present

## 2015-03-06 DIAGNOSIS — I2510 Atherosclerotic heart disease of native coronary artery without angina pectoris: Secondary | ICD-10-CM | POA: Insufficient documentation

## 2015-03-06 DIAGNOSIS — Z794 Long term (current) use of insulin: Secondary | ICD-10-CM | POA: Insufficient documentation

## 2015-03-06 DIAGNOSIS — E785 Hyperlipidemia, unspecified: Secondary | ICD-10-CM | POA: Insufficient documentation

## 2015-03-06 DIAGNOSIS — Z8249 Family history of ischemic heart disease and other diseases of the circulatory system: Secondary | ICD-10-CM | POA: Insufficient documentation

## 2015-03-06 DIAGNOSIS — R1084 Generalized abdominal pain: Secondary | ICD-10-CM | POA: Diagnosis not present

## 2015-03-06 DIAGNOSIS — Z955 Presence of coronary angioplasty implant and graft: Secondary | ICD-10-CM | POA: Insufficient documentation

## 2015-03-06 DIAGNOSIS — R112 Nausea with vomiting, unspecified: Secondary | ICD-10-CM | POA: Insufficient documentation

## 2015-03-06 DIAGNOSIS — N400 Enlarged prostate without lower urinary tract symptoms: Secondary | ICD-10-CM | POA: Diagnosis not present

## 2015-03-06 DIAGNOSIS — R1110 Vomiting, unspecified: Secondary | ICD-10-CM | POA: Diagnosis present

## 2015-03-06 DIAGNOSIS — R109 Unspecified abdominal pain: Secondary | ICD-10-CM | POA: Diagnosis present

## 2015-03-06 DIAGNOSIS — I493 Ventricular premature depolarization: Secondary | ICD-10-CM | POA: Insufficient documentation

## 2015-03-06 MED ORDER — ONDANSETRON 4 MG PO TBDP
4.0000 mg | ORAL_TABLET | Freq: Once | ORAL | Status: AC | PRN
  Administered 2015-03-06: 4 mg via ORAL

## 2015-03-06 MED ORDER — ONDANSETRON 4 MG PO TBDP
ORAL_TABLET | ORAL | Status: AC
  Filled 2015-03-06: qty 1

## 2015-03-06 NOTE — ED Notes (Signed)
Pt reports eating at new restaurant this evening and after eating became nauseas. Pt reports NVD since eating food. Pt denies other symptoms at this time.

## 2015-03-07 ENCOUNTER — Emergency Department: Payer: Medicaid Other

## 2015-03-07 ENCOUNTER — Observation Stay
Admission: EM | Admit: 2015-03-07 | Discharge: 2015-03-07 | Disposition: A | Discharge status: Home / Self Care | Payer: Medicaid Other | Attending: Internal Medicine | Admitting: Internal Medicine

## 2015-03-07 DIAGNOSIS — R112 Nausea with vomiting, unspecified: Secondary | ICD-10-CM | POA: Diagnosis present

## 2015-03-07 DIAGNOSIS — R1110 Vomiting, unspecified: Secondary | ICD-10-CM

## 2015-03-07 DIAGNOSIS — R109 Unspecified abdominal pain: Secondary | ICD-10-CM

## 2015-03-07 DIAGNOSIS — R739 Hyperglycemia, unspecified: Secondary | ICD-10-CM

## 2015-03-07 DIAGNOSIS — R197 Diarrhea, unspecified: Secondary | ICD-10-CM

## 2015-03-07 LAB — TSH: TSH: 2.215 u[IU]/mL (ref 0.350–4.500)

## 2015-03-07 LAB — CBC
HCT: 42.3 % (ref 40.0–52.0)
Hemoglobin: 14.2 g/dL (ref 13.0–18.0)
MCH: 29.4 pg (ref 26.0–34.0)
MCHC: 33.5 g/dL (ref 32.0–36.0)
MCV: 87.8 fL (ref 80.0–100.0)
PLATELETS: 328 10*3/uL (ref 150–440)
RBC: 4.82 MIL/uL (ref 4.40–5.90)
RDW: 13.2 % (ref 11.5–14.5)
WBC: 7.8 10*3/uL (ref 3.8–10.6)

## 2015-03-07 LAB — URINALYSIS COMPLETE WITH MICROSCOPIC (ARMC ONLY)
Bacteria, UA: NONE SEEN
Bilirubin Urine: NEGATIVE
Glucose, UA: >500 mg/dL — AB
Hgb urine dipstick: NEGATIVE
Leukocytes, UA: NEGATIVE
NITRITE: NEGATIVE
PH: 7 (ref 5.0–8.0)
Protein, ur: NEGATIVE mg/dL
RBC / HPF: NONE SEEN RBC/hpf (ref 0–5)
SPECIFIC GRAVITY, URINE: 1.027 (ref 1.005–1.030)
Squamous Epithelial / LPF: NONE SEEN

## 2015-03-07 LAB — GLUCOSE, CAPILLARY
GLUCOSE-CAPILLARY: 499 mg/dL — AB (ref 65–99)
Glucose-Capillary: 293 mg/dL — ABNORMAL HIGH (ref 65–99)
Glucose-Capillary: 254 mg/dL — ABNORMAL HIGH (ref 65–99)
Glucose-Capillary: 306 mg/dL — ABNORMAL HIGH (ref 65–99)

## 2015-03-07 LAB — COMPREHENSIVE METABOLIC PANEL
ALK PHOS: 64 U/L (ref 38–126)
ALT: 17 U/L (ref 17–63)
AST: 16 U/L (ref 15–41)
Albumin: 4.2 g/dL (ref 3.5–5.0)
Anion gap: 8 (ref 5–15)
BUN: 13 mg/dL (ref 6–20)
CALCIUM: 9.6 mg/dL (ref 8.9–10.3)
CO2: 24 mmol/L (ref 22–32)
Chloride: 98 mmol/L — ABNORMAL LOW (ref 101–111)
Creatinine, Ser: 1.07 mg/dL (ref 0.61–1.24)
Glucose, Bld: 569 mg/dL (ref 65–99)
Potassium: 4.2 mmol/L (ref 3.5–5.1)
Sodium: 130 mmol/L — ABNORMAL LOW (ref 135–145)
TOTAL PROTEIN: 8.7 g/dL — AB (ref 6.5–8.1)
Total Bilirubin: 0.9 mg/dL (ref 0.3–1.2)

## 2015-03-07 LAB — LIPASE, BLOOD: Lipase: 31 U/L (ref 11–51)

## 2015-03-07 LAB — TROPONIN I

## 2015-03-07 LAB — OSMOLALITY: Osmolality: 301 mOsm/kg — ABNORMAL HIGH (ref 275–295)

## 2015-03-07 IMAGING — CT CT ABD-PELV W/ CM
2 of 5 series · 17 of 46 positions shown, 19 images · IV contrast (omnipaque)
Comparison: None.

CLINICAL DATA: Patient became nauseated after eating in any
restaurant this evening. Nausea, vomiting, and diarrhea since
eating. No other symptoms.

EXAM:
CT ABDOMEN AND PELVIS WITH CONTRAST
TECHNIQUE: Multidetector CT imaging of the abdomen and pelvis was performed
using the standard protocol following bolus administration of
intravenous contrast.
CONTRAST:  100mL OMNIPAQUE IOHEXOL 300 MG/ML  SOLN

[Series 2: routine abd pel with · axial · 0.74mm/px · z∈[-540,-60]mm · 14 of 108 slices shown, 16 images]
[im 6/108  soft-tissue]
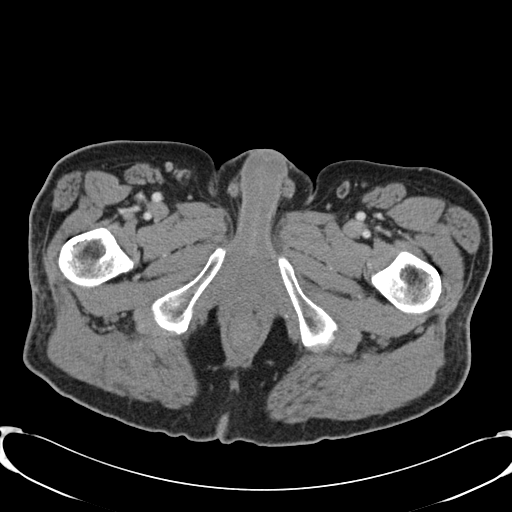
[im 6/108  bone]
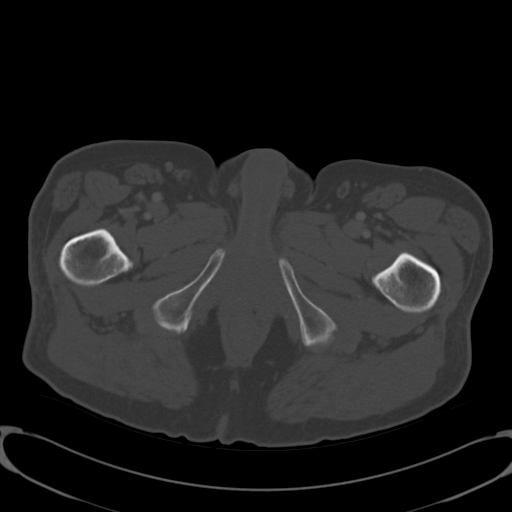
[im 12/108  soft-tissue]
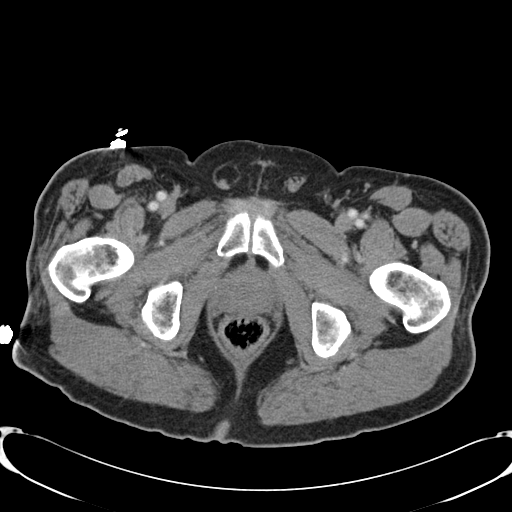
[im 23/108  soft-tissue]
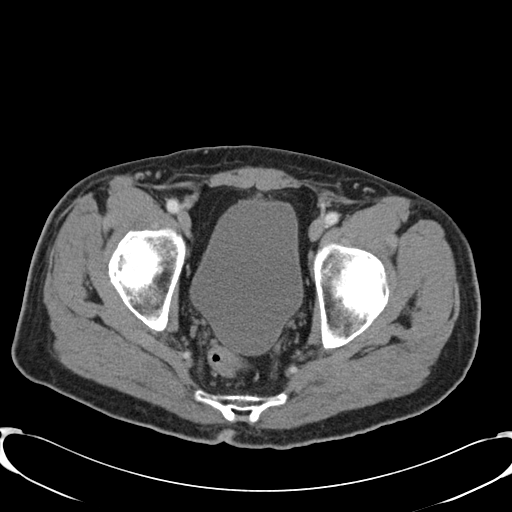
[im 29/108  soft-tissue]
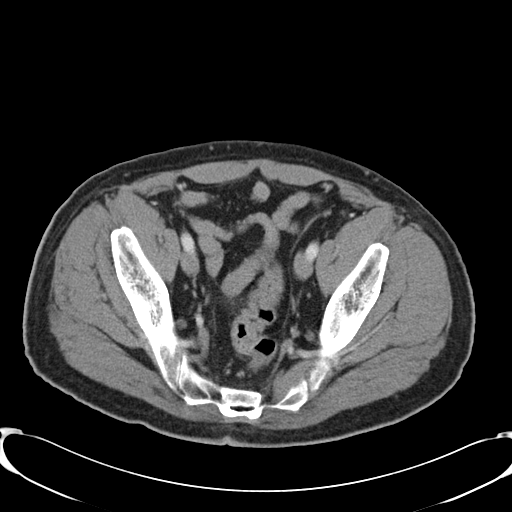
[im 34/108  soft-tissue]
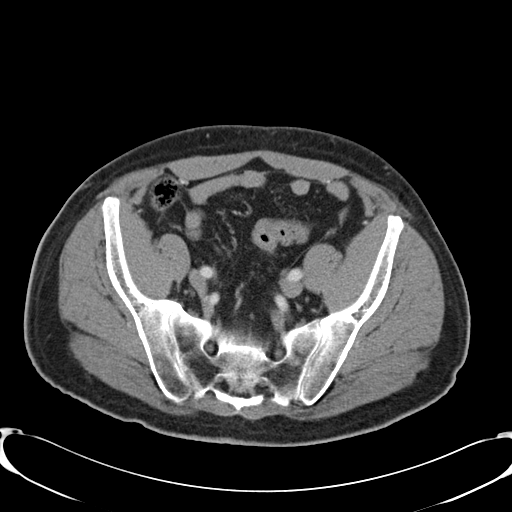
[im 46/108  soft-tissue]
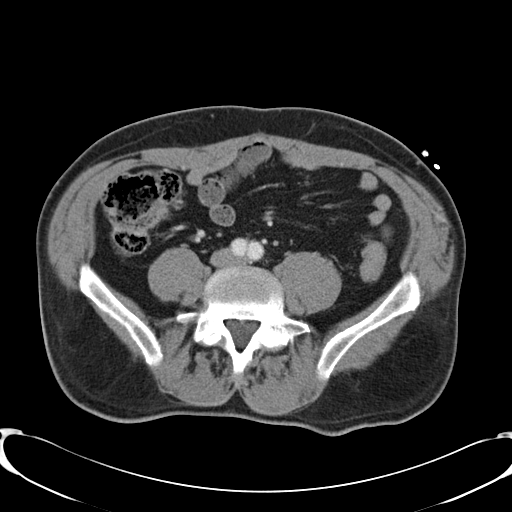
[im 51/108  soft-tissue]
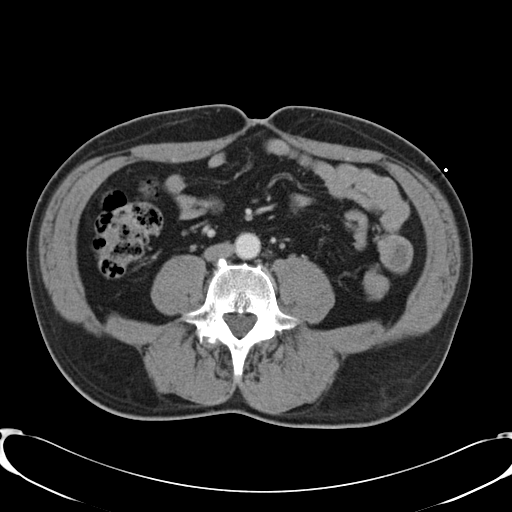
[im 57/108  soft-tissue]
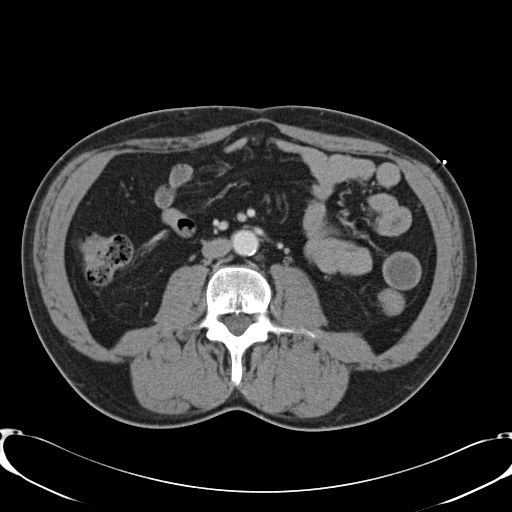
[im 62/108  soft-tissue]
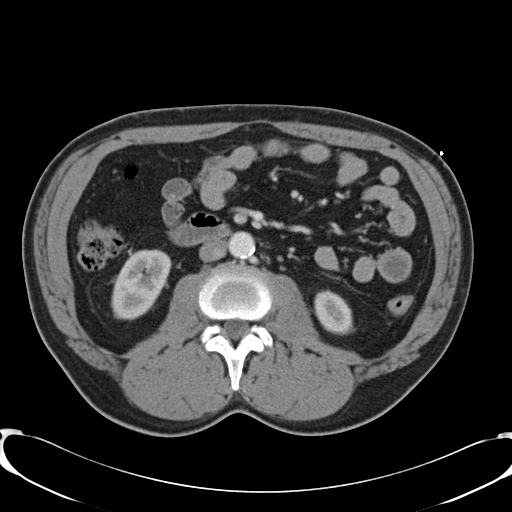
[im 62/108  bone]
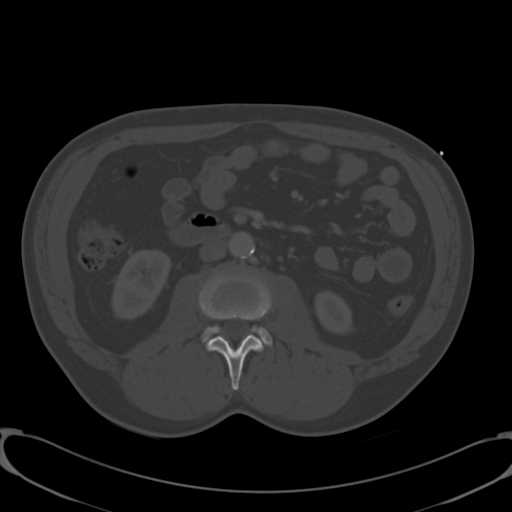
[im 74/108  soft-tissue]
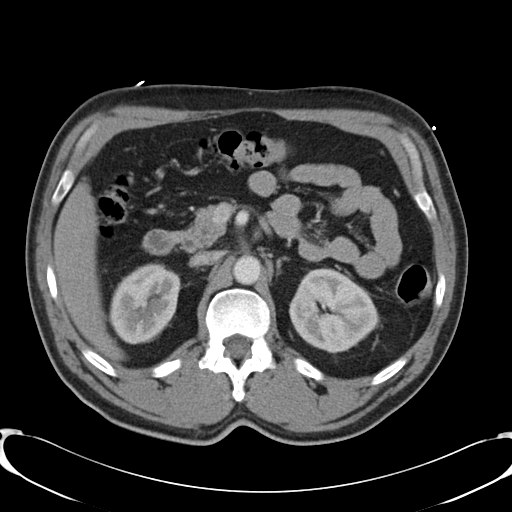
[im 79/108  soft-tissue]
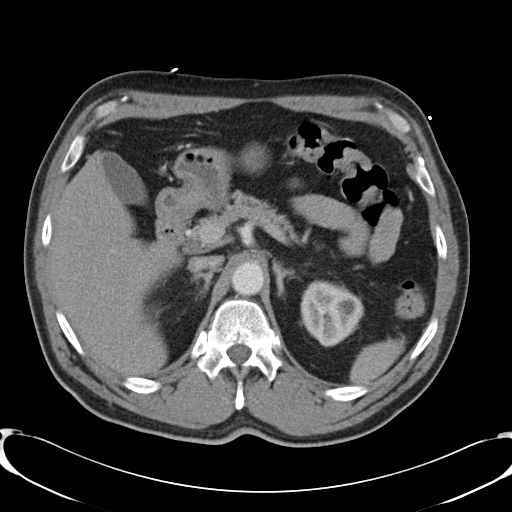
[im 85/108  soft-tissue]
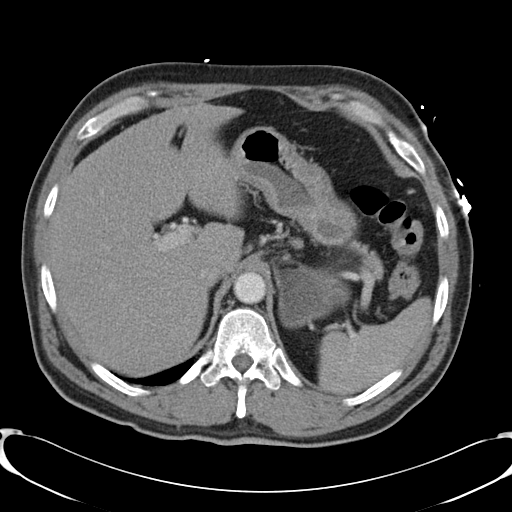
[im 96/108  soft-tissue]
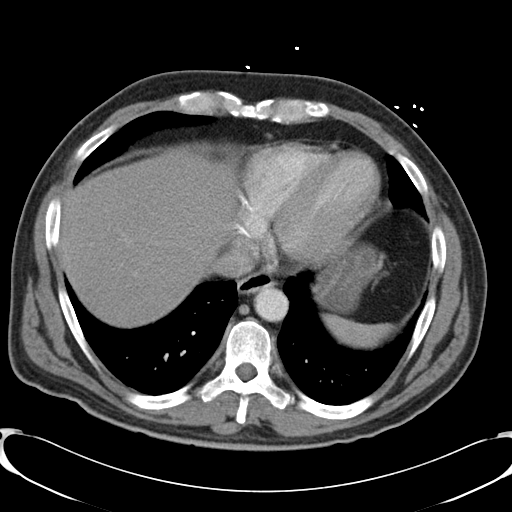
[im 102/108  soft-tissue]
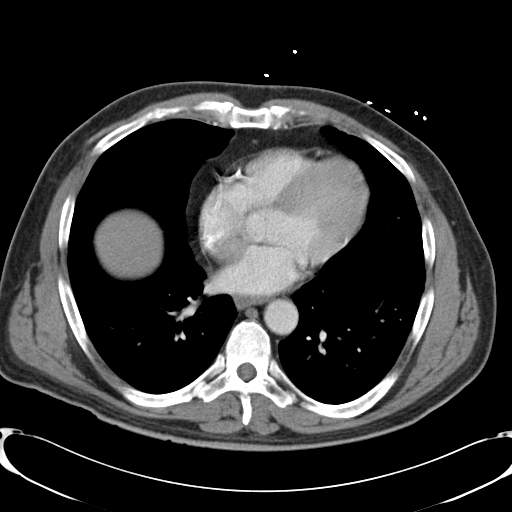

[Series 5: cor routine abd pel with · coronal · 1.10mm/px · 3 of 146 slices shown]
[im 49/146  soft-tissue]
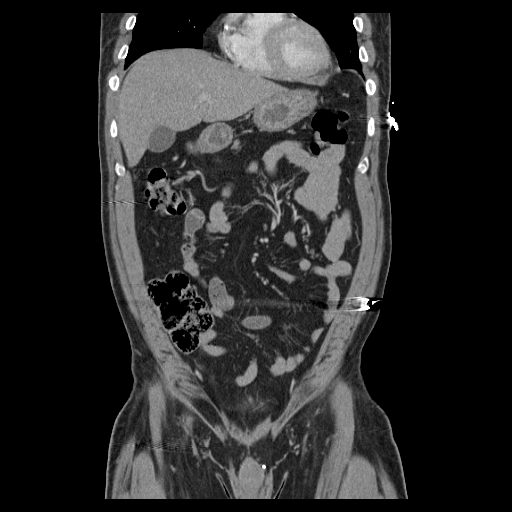
[im 65/146  soft-tissue]
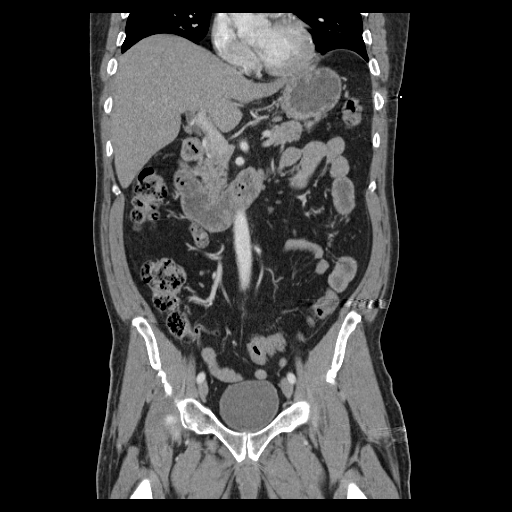
[im 81/146  soft-tissue]
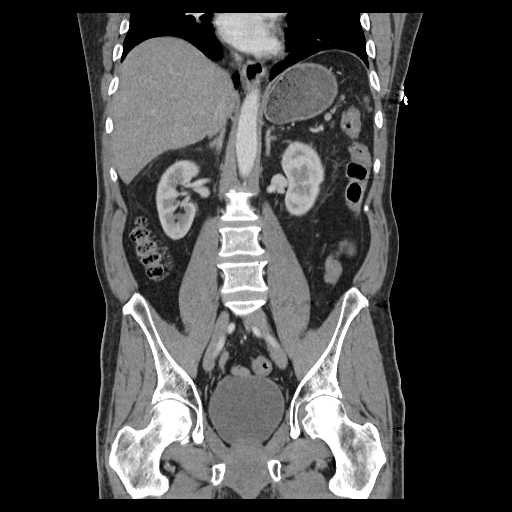

[17 of 46 positions shown; findings below may reference images not displayed]

FINDINGS: Dependent atelectasis in the lung bases. Coronary artery
calcification.

Mild diffuse fatty infiltration in the liver. No focal liver
lesions. The gallbladder, pancreas, spleen, adrenal glands,
abdominal aorta, inferior vena cava, and retroperitoneal lymph nodes
are unremarkable. Sub cm cyst in the left kidney. No hydronephrosis
in either kidney. Renal nephrograms are symmetrical. Stomach, small
bowel, and colon are not abnormally distended. No free air or free
fluid in the abdomen. Abdominal wall musculature appears intact.

Pelvis: Appendix is normal. Rectosigmoid colon is unremarkable.
Prostate gland is enlarged, measuring 4.1 cm diameter. Bladder wall
is not thickened. Fat in the inguinal canals. No free or loculated
pelvic fluid collections. No pelvic mass or lymphadenopathy. No
destructive bone lesions.
IMPRESSION: No acute process demonstrated in the abdomen or pelvis. No evidence
of bowel obstruction or inflammation. Mild diffuse fatty
infiltration of the liver. Mild enlarged prostate.
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
## 2015-03-07 MED ORDER — PRASUGREL HCL 10 MG PO TABS: 10.0000 mg | ORAL_TABLET | Freq: Every day | ORAL | Status: DC

## 2015-03-07 MED ORDER — ONDANSETRON HCL 4 MG/2ML IJ SOLN
4.0000 mg | Freq: Once | INTRAMUSCULAR | Status: AC
  Administered 2015-03-07: 4 mg via INTRAVENOUS

## 2015-03-07 MED ORDER — MORPHINE SULFATE (PF) 4 MG/ML IV SOLN
INTRAVENOUS | Status: AC
  Administered 2015-03-07: 4 mg via INTRAVENOUS
  Filled 2015-03-07: qty 1

## 2015-03-07 MED ORDER — ISOSORBIDE MONONITRATE ER 30 MG PO TB24: 30.0000 mg | ORAL_TABLET | Freq: Two times a day (BID) | ORAL | Status: DC

## 2015-03-07 MED ORDER — AMLODIPINE BESYLATE 10 MG PO TABS
10.0000 mg | ORAL_TABLET | Freq: Every day | ORAL | Status: DC
  Administered 2015-03-07: 10 mg via ORAL
  Filled 2015-03-07: qty 1

## 2015-03-07 MED ORDER — HEPARIN SODIUM (PORCINE) 5000 UNIT/ML IJ SOLN
5000.0000 [IU] | Freq: Three times a day (TID) | INTRAMUSCULAR | Status: DC
  Administered 2015-03-07 (×2): 5000 [IU] via SUBCUTANEOUS
  Filled 2015-03-07 (×2): qty 1

## 2015-03-07 MED ORDER — SODIUM CHLORIDE 0.9 % IV BOLUS (SEPSIS)
1000.0000 mL | INTRAVENOUS | Status: AC
  Administered 2015-03-07: 1000 mL via INTRAVENOUS

## 2015-03-07 MED ORDER — HYDROCODONE-ACETAMINOPHEN 5-325 MG PO TABS: 1.0000 | ORAL_TABLET | ORAL | Status: DC | PRN

## 2015-03-07 MED ORDER — ASPIRIN EC 81 MG PO TBEC
81.0000 mg | DELAYED_RELEASE_TABLET | Freq: Every day | ORAL | Status: DC
Start: 2015-03-07 — End: 2015-03-07
  Administered 2015-03-07: 81 mg via ORAL
  Filled 2015-03-07: qty 1

## 2015-03-07 MED ORDER — HALOPERIDOL LACTATE 5 MG/ML IJ SOLN
1.0000 mg | Freq: Once | INTRAMUSCULAR | Status: AC
Start: 2015-03-07 — End: 2015-03-07
  Administered 2015-03-07: 1 mg via INTRAVENOUS
  Filled 2015-03-07: qty 1

## 2015-03-07 MED ORDER — PRAVASTATIN SODIUM 20 MG PO TABS: 40.0000 mg | ORAL_TABLET | Freq: Every day | ORAL | Status: DC

## 2015-03-07 MED ORDER — MORPHINE SULFATE (PF) 4 MG/ML IV SOLN
4.0000 mg | Freq: Once | INTRAVENOUS | Status: AC
  Administered 2015-03-07: 4 mg via INTRAVENOUS

## 2015-03-07 MED ORDER — ACETAMINOPHEN 325 MG PO TABS: 650.0000 mg | ORAL_TABLET | Freq: Four times a day (QID) | ORAL | Status: DC | PRN

## 2015-03-07 MED ORDER — LOSARTAN POTASSIUM 50 MG PO TABS: 100.0000 mg | ORAL_TABLET | Freq: Every day | ORAL | Status: DC

## 2015-03-07 MED ORDER — SODIUM CHLORIDE 0.9 % IJ SOLN: 3.0000 mL | Freq: Two times a day (BID) | INTRAMUSCULAR | Status: DC

## 2015-03-07 MED ORDER — ATORVASTATIN CALCIUM 20 MG PO TABS: 40.0000 mg | ORAL_TABLET | Freq: Every day | ORAL | Status: DC

## 2015-03-07 MED ORDER — METOPROLOL TARTRATE 50 MG PO TABS: 50.0000 mg | ORAL_TABLET | Freq: Two times a day (BID) | ORAL | Status: DC

## 2015-03-07 MED ORDER — CYCLOBENZAPRINE HCL 10 MG PO TABS
10.0000 mg | ORAL_TABLET | Freq: Three times a day (TID) | ORAL | Status: DC | PRN
Start: 2015-03-07 — End: 2015-03-07

## 2015-03-07 MED ORDER — CYCLOBENZAPRINE HCL 10 MG PO TABS
5.0000 mg | ORAL_TABLET | Freq: Three times a day (TID) | ORAL | Status: DC | PRN
Start: 2015-03-07 — End: 2015-03-07

## 2015-03-07 MED ORDER — SODIUM CHLORIDE 0.9 % IV SOLN
INTRAVENOUS | Status: DC
  Administered 2015-03-07: 09:00:00 via INTRAVENOUS

## 2015-03-07 MED ORDER — INSULIN ASPART 100 UNIT/ML ~~LOC~~ SOLN
10.0000 [IU] | Freq: Once | SUBCUTANEOUS | Status: AC
  Administered 2015-03-07: 10 [IU] via SUBCUTANEOUS
  Filled 2015-03-07: qty 10

## 2015-03-07 MED ORDER — ACETAMINOPHEN 650 MG RE SUPP: 650.0000 mg | Freq: Four times a day (QID) | RECTAL | Status: DC | PRN

## 2015-03-07 MED ORDER — CLONIDINE HCL 0.1 MG PO TABS
0.2000 mg | ORAL_TABLET | Freq: Two times a day (BID) | ORAL | Status: DC
  Administered 2015-03-07: 0.2 mg via ORAL
  Filled 2015-03-07: qty 2

## 2015-03-07 MED ORDER — ONDANSETRON HCL 4 MG/2ML IJ SOLN: 4.0000 mg | Freq: Four times a day (QID) | INTRAMUSCULAR | Status: DC | PRN

## 2015-03-07 MED ORDER — MORPHINE SULFATE (PF) 4 MG/ML IV SOLN
4.0000 mg | Freq: Once | INTRAVENOUS | Status: AC
  Administered 2015-03-07: 4 mg via INTRAVENOUS
  Filled 2015-03-07: qty 1

## 2015-03-07 MED ORDER — CLOPIDOGREL BISULFATE 75 MG PO TABS
75.0000 mg | ORAL_TABLET | Freq: Every day | ORAL | Status: DC
  Administered 2015-03-07: 75 mg via ORAL
  Filled 2015-03-07: qty 1

## 2015-03-07 MED ORDER — INSULIN GLARGINE 100 UNIT/ML ~~LOC~~ SOLN
18.0000 [IU] | Freq: Every day | SUBCUTANEOUS | Status: DC
  Filled 2015-03-07: qty 0.18

## 2015-03-07 MED ORDER — NITROGLYCERIN 0.4 MG SL SUBL: 0.4000 mg | SUBLINGUAL_TABLET | SUBLINGUAL | Status: DC | PRN

## 2015-03-07 MED ORDER — IOHEXOL 300 MG/ML  SOLN
100.0000 mL | Freq: Once | INTRAMUSCULAR | Status: AC | PRN
  Administered 2015-03-07: 100 mL via INTRAVENOUS

## 2015-03-07 MED ORDER — MORPHINE SULFATE (PF) 4 MG/ML IV SOLN
INTRAVENOUS | Status: AC
  Filled 2015-03-07: qty 1

## 2015-03-07 MED ORDER — INSULIN ASPART 100 UNIT/ML ~~LOC~~ SOLN
0.0000 [IU] | Freq: Three times a day (TID) | SUBCUTANEOUS | Status: DC
  Administered 2015-03-07: 7 [IU] via SUBCUTANEOUS
  Filled 2015-03-07: qty 2
  Filled 2015-03-07 (×2): qty 5

## 2015-03-07 MED ORDER — ONDANSETRON HCL 4 MG PO TABS: 4.0000 mg | ORAL_TABLET | Freq: Four times a day (QID) | ORAL | Status: DC | PRN

## 2015-03-07 MED ORDER — ONDANSETRON HCL 4 MG/2ML IJ SOLN
INTRAMUSCULAR | Status: AC
  Administered 2015-03-07: 4 mg via INTRAVENOUS
  Filled 2015-03-07: qty 2

## 2015-03-07 MED ORDER — INSULIN ASPART 100 UNIT/ML ~~LOC~~ SOLN
5.0000 [IU] | Freq: Once | SUBCUTANEOUS | Status: AC
  Administered 2015-03-07: 5 [IU] via SUBCUTANEOUS

## 2015-03-07 NOTE — ED Notes (Signed)
Patient medicated for nausea and pain. States relief. Pain is now 7/10 from 10/10. Resting quietly.
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
## 2015-03-07 NOTE — ED Notes (Signed)
Patient transported to x-ray. ?
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
## 2015-03-07 NOTE — Care Management Note (Signed)
Case Management Note  Patient Details  Name: Steve Alvarado MRN: [id number] Date of Birth: 07/22/1955  Subjective/Objective:   Provided [name] with a list of local clinics where he could receive free medical care and meds. Strongly encouraged [name] to contact one of these local clinics, [facility], [facility] or [facility]. [name] has insulin syringes at home but no insulin. [name] was provided with a coupon for the [facility] to [facility] a pharmacy [facility] to obtain insulin for $3.00 co-pay. Explained to [name] that this MATCH program was a one time only medication assistance program.                  Action/Plan:   Expected Discharge Date:                  Expected Discharge Plan:     In-House Referral:     Discharge planning Services     Post Acute Care Choice:    Choice offered to:     DME Arranged:    DME Agency:     HH Arranged:    HH Agency:     Status of Service:     Medicare Important Message Given:    Date Medicare IM Given:    Medicare IM give by:    Date Additional Medicare IM Given:    Additional Medicare Important Message give by:     If discussed at Long Length of Stay Meetings, dates discussed:    Additional Comments:  [name] A, RN 03/07/2015, 12:52 PM

## 2015-03-07 NOTE — ED Notes (Signed)
Patient up to commode with diarrhea and nausea. Had a large emesis and has some relief.

## 2015-03-07 NOTE — Progress Notes (Signed)
Discharge Note:  Pt given discharge instructions and handwritten prescription. Pts vitals are stable. Pt wheeled to lobby by staff.

## 2015-03-07 NOTE — ED Notes (Signed)
Patient is resting comfortably. Wife at bedside.

## 2015-03-08 ENCOUNTER — Observation Stay
Admission: EM | Admit: 2015-03-08 | Discharge: 2015-03-10 | Disposition: A | Discharge status: Home / Self Care | Payer: Medicaid Other | Attending: Internal Medicine | Admitting: Internal Medicine

## 2015-03-08 ENCOUNTER — Emergency Department: Payer: Medicaid Other

## 2015-03-08 ENCOUNTER — Encounter: Payer: Self-pay | Admitting: *Deleted

## 2015-03-08 DIAGNOSIS — R1013 Epigastric pain: Secondary | ICD-10-CM | POA: Diagnosis not present

## 2015-03-08 DIAGNOSIS — Z791 Long term (current) use of non-steroidal anti-inflammatories (NSAID): Secondary | ICD-10-CM | POA: Diagnosis not present

## 2015-03-08 DIAGNOSIS — Z7982 Long term (current) use of aspirin: Secondary | ICD-10-CM | POA: Insufficient documentation

## 2015-03-08 DIAGNOSIS — I440 Atrioventricular block, first degree: Secondary | ICD-10-CM | POA: Insufficient documentation

## 2015-03-08 DIAGNOSIS — Z794 Long term (current) use of insulin: Secondary | ICD-10-CM | POA: Insufficient documentation

## 2015-03-08 DIAGNOSIS — E785 Hyperlipidemia, unspecified: Secondary | ICD-10-CM | POA: Insufficient documentation

## 2015-03-08 DIAGNOSIS — N400 Enlarged prostate without lower urinary tract symptoms: Secondary | ICD-10-CM | POA: Insufficient documentation

## 2015-03-08 DIAGNOSIS — I4581 Long QT syndrome: Secondary | ICD-10-CM | POA: Diagnosis not present

## 2015-03-08 DIAGNOSIS — Z79899 Other long term (current) drug therapy: Secondary | ICD-10-CM | POA: Insufficient documentation

## 2015-03-08 DIAGNOSIS — Z955 Presence of coronary angioplasty implant and graft: Secondary | ICD-10-CM | POA: Insufficient documentation

## 2015-03-08 DIAGNOSIS — Z7902 Long term (current) use of antithrombotics/antiplatelets: Secondary | ICD-10-CM | POA: Diagnosis not present

## 2015-03-08 DIAGNOSIS — I2510 Atherosclerotic heart disease of native coronary artery without angina pectoris: Secondary | ICD-10-CM | POA: Insufficient documentation

## 2015-03-08 DIAGNOSIS — I10 Essential (primary) hypertension: Secondary | ICD-10-CM | POA: Insufficient documentation

## 2015-03-08 DIAGNOSIS — J9811 Atelectasis: Secondary | ICD-10-CM | POA: Insufficient documentation

## 2015-03-08 DIAGNOSIS — R109 Unspecified abdominal pain: Secondary | ICD-10-CM | POA: Diagnosis present

## 2015-03-08 DIAGNOSIS — E1165 Type 2 diabetes mellitus with hyperglycemia: Secondary | ICD-10-CM | POA: Insufficient documentation

## 2015-03-08 DIAGNOSIS — R197 Diarrhea, unspecified: Secondary | ICD-10-CM | POA: Insufficient documentation

## 2015-03-08 DIAGNOSIS — I160 Hypertensive urgency: Secondary | ICD-10-CM

## 2015-03-08 DIAGNOSIS — R112 Nausea with vomiting, unspecified: Secondary | ICD-10-CM | POA: Insufficient documentation

## 2015-03-08 DIAGNOSIS — I252 Old myocardial infarction: Secondary | ICD-10-CM | POA: Insufficient documentation

## 2015-03-08 DIAGNOSIS — Z8249 Family history of ischemic heart disease and other diseases of the circulatory system: Secondary | ICD-10-CM | POA: Diagnosis not present

## 2015-03-08 DIAGNOSIS — K3184 Gastroparesis: Secondary | ICD-10-CM | POA: Insufficient documentation

## 2015-03-08 HISTORY — DX: Hypertensive urgency: I16.0

## 2015-03-08 LAB — COMPREHENSIVE METABOLIC PANEL
ALBUMIN: 4.3 g/dL (ref 3.5–5.0)
ALK PHOS: 59 U/L (ref 38–126)
ALT: 18 U/L (ref 17–63)
AST: 18 U/L (ref 15–41)
Anion gap: 13 (ref 5–15)
BILIRUBIN TOTAL: 1 mg/dL (ref 0.3–1.2)
BUN: 17 mg/dL (ref 6–20)
CALCIUM: 9.7 mg/dL (ref 8.9–10.3)
CO2: 20 mmol/L — ABNORMAL LOW (ref 22–32)
CREATININE: 1.03 mg/dL (ref 0.61–1.24)
Chloride: 99 mmol/L — ABNORMAL LOW (ref 101–111)
Glucose, Bld: 445 mg/dL — ABNORMAL HIGH (ref 65–99)
Potassium: 3.3 mmol/L — ABNORMAL LOW (ref 3.5–5.1)
Sodium: 132 mmol/L — ABNORMAL LOW (ref 135–145)
TOTAL PROTEIN: 8.3 g/dL — AB (ref 6.5–8.1)

## 2015-03-08 LAB — CBC WITH DIFFERENTIAL/PLATELET
Basophils Absolute: 0.1 10*3/uL (ref 0–0.1)
EOS ABS: 0.1 10*3/uL (ref 0–0.7)
Eosinophils Relative: 1 %
HCT: 41.6 % (ref 40.0–52.0)
Hemoglobin: 14.4 g/dL (ref 13.0–18.0)
Lymphocytes Relative: 28 %
Lymphs Abs: 2.5 10*3/uL (ref 1.0–3.6)
MCH: 29.9 pg (ref 26.0–34.0)
MCHC: 34.6 g/dL (ref 32.0–36.0)
MCV: 86.4 fL (ref 80.0–100.0)
Monocytes Absolute: 0.5 10*3/uL (ref 0.2–1.0)
Monocytes Relative: 6 %
Neutro Abs: 5.7 10*3/uL (ref 1.4–6.5)
PLATELETS: 287 10*3/uL (ref 150–440)
RBC: 4.82 MIL/uL (ref 4.40–5.90)
RDW: 13.4 % (ref 11.5–14.5)
WBC: 8.9 10*3/uL (ref 3.8–10.6)

## 2015-03-08 LAB — URINALYSIS COMPLETE WITH MICROSCOPIC (ARMC ONLY)
BILIRUBIN URINE: NEGATIVE
Bacteria, UA: NONE SEEN
Glucose, UA: >500 mg/dL — AB
Hgb urine dipstick: NEGATIVE
Nitrite: NEGATIVE
PH: 7 (ref 5.0–8.0)
PROTEIN: NEGATIVE mg/dL
SPECIFIC GRAVITY, URINE: 1.02 (ref 1.005–1.030)

## 2015-03-08 LAB — LACTIC ACID, PLASMA: Lactic Acid, Venous: 2.1 mmol/L (ref 0.5–2.0)

## 2015-03-08 LAB — GLUCOSE, CAPILLARY: Glucose-Capillary: 338 mg/dL — ABNORMAL HIGH (ref 65–99)

## 2015-03-08 LAB — LIPASE, BLOOD: LIPASE: 23 U/L (ref 11–51)

## 2015-03-08 LAB — TROPONIN I: Troponin I: <0.03 ng/mL (ref <0.031)

## 2015-03-08 IMAGING — CR DG ABDOMEN ACUTE W/ 1V CHEST
1 series · 4 of 4 positions shown · non-contrast
Comparison: CT dated [DATE]

CLINICAL DATA: 59-year-old male with epigastric pain and vomiting
and diarrhea x2 days

EXAM:
DG ABDOMEN ACUTE W/ 1V CHEST

[Series 1: dg abd acute w/chest · 0.14mm/px · 4 of 4 slices shown]
[im 1/4]
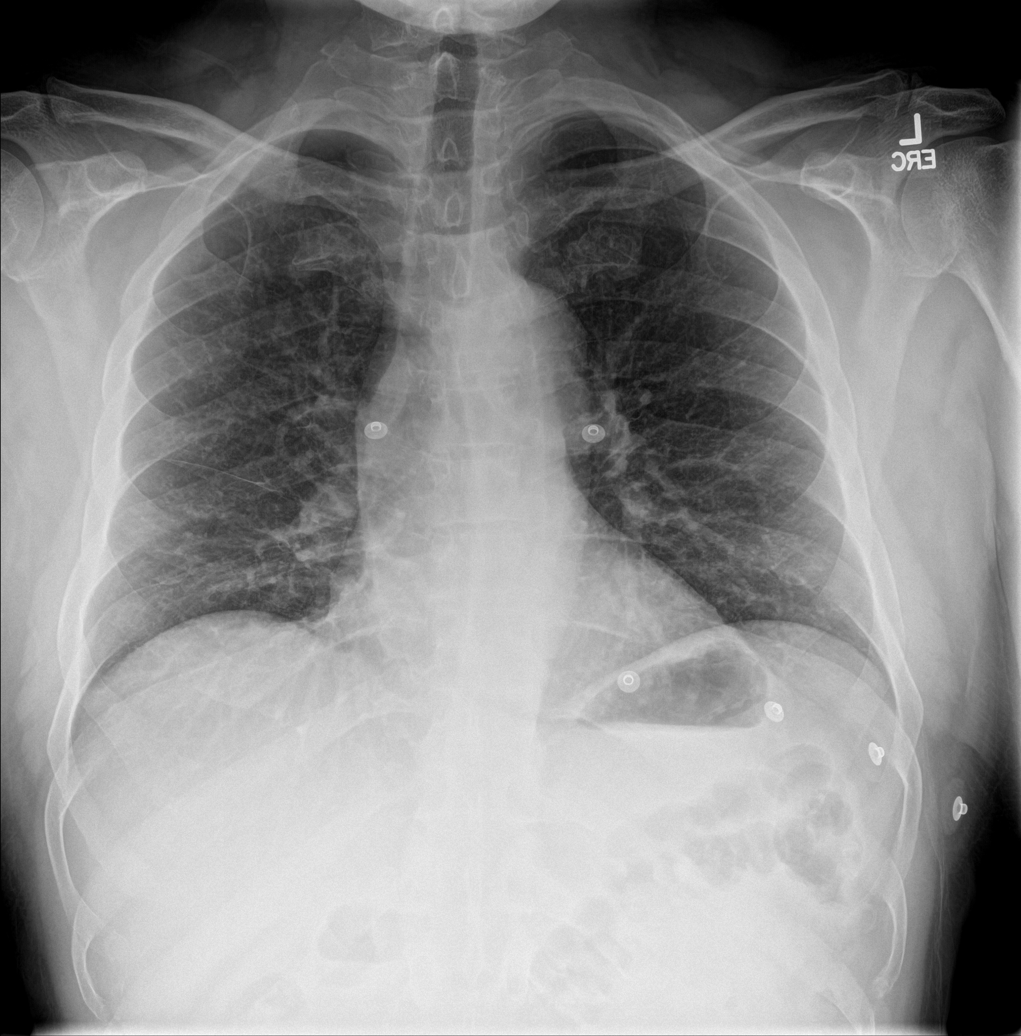
[im 2/4]
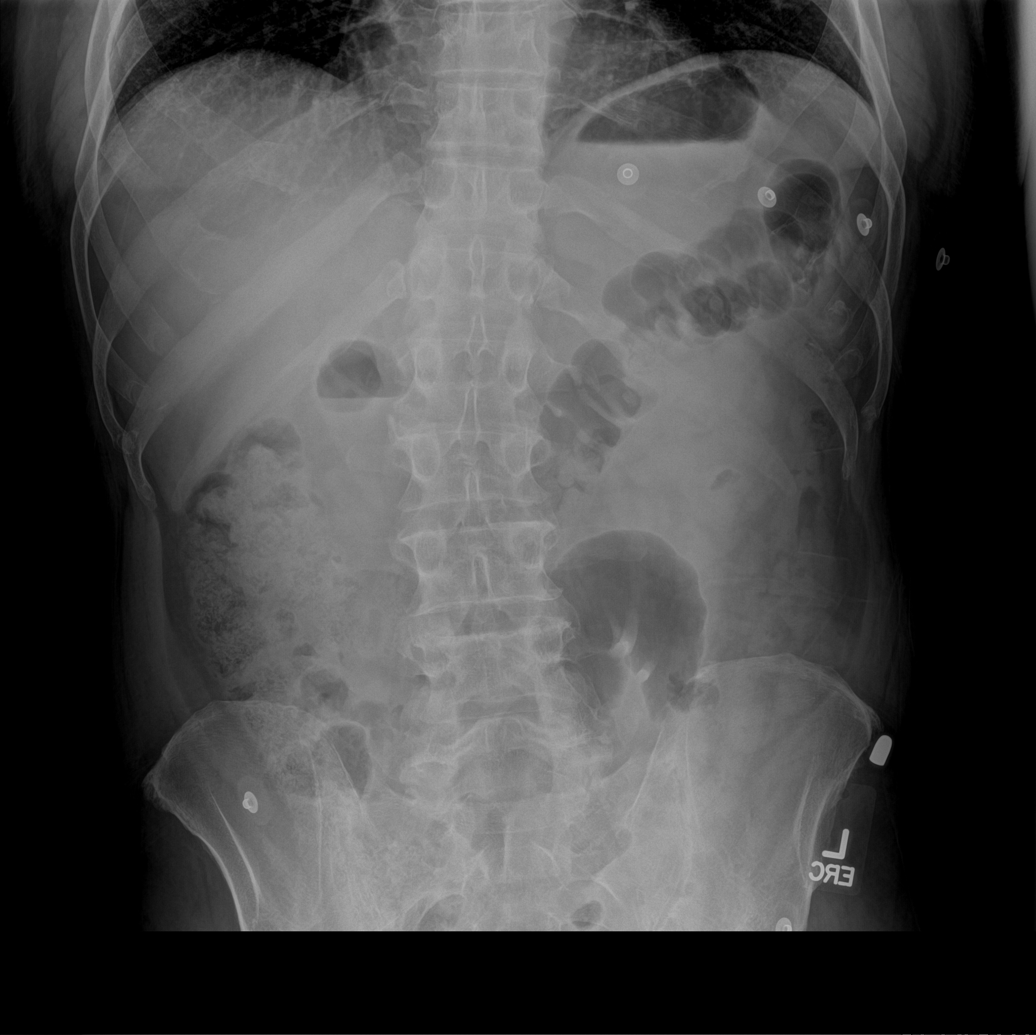
[im 3/4]
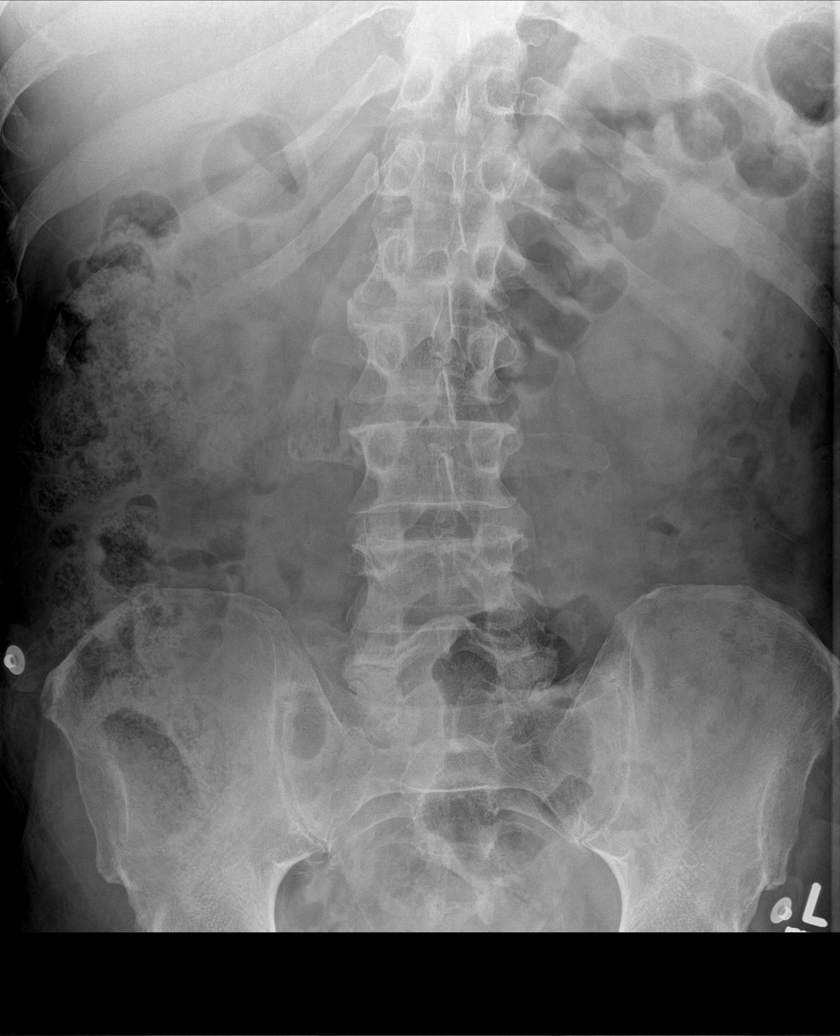
[im 4/4]
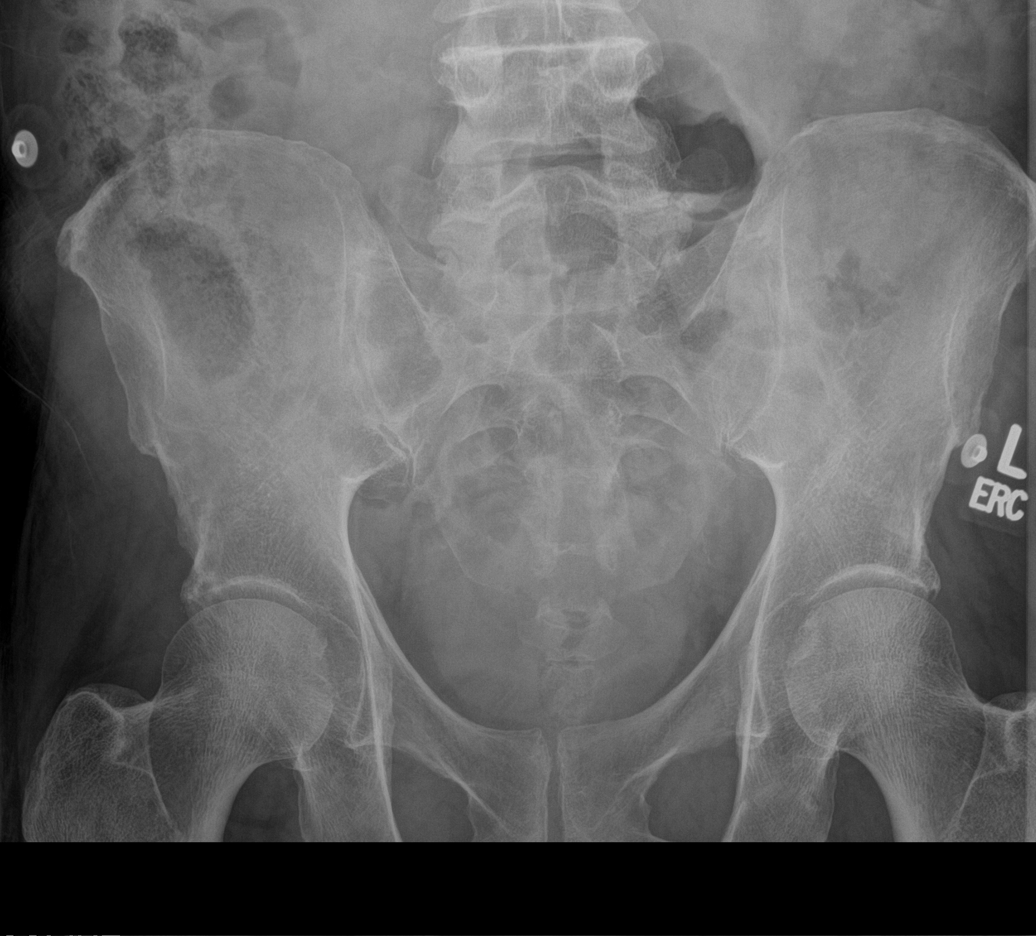

[4 of 4 positions shown; findings below may reference images not displayed]

FINDINGS: There is no evidence of dilated bowel loops or free intraperitoneal
air. No radiopaque calculi or other significant radiographic
abnormality is seen. Heart size and mediastinal contours are within
normal limits. Both lungs are clear.
IMPRESSION: Negative abdominal radiographs.  No acute cardiopulmonary disease.

## 2015-03-08 MED ORDER — DIPHENHYDRAMINE HCL 50 MG/ML IJ SOLN
50.0000 mg | Freq: Once | INTRAMUSCULAR | Status: AC
  Administered 2015-03-08: 50 mg via INTRAVENOUS
  Filled 2015-03-08: qty 1

## 2015-03-08 MED ORDER — ONDANSETRON HCL 4 MG/2ML IJ SOLN: 4.0000 mg | Freq: Four times a day (QID) | INTRAMUSCULAR | Status: DC | PRN

## 2015-03-08 MED ORDER — INSULIN GLARGINE 100 UNIT/ML ~~LOC~~ SOLN
20.0000 [IU] | Freq: Two times a day (BID) | SUBCUTANEOUS | Status: DC
Start: 2015-03-08 — End: 2015-03-10
  Administered 2015-03-09 – 2015-03-10 (×4): 20 [IU] via SUBCUTANEOUS
  Filled 2015-03-08 (×6): qty 0.2

## 2015-03-08 MED ORDER — SODIUM CHLORIDE 0.9 % IV SOLN
Freq: Once | INTRAVENOUS | Status: AC
  Administered 2015-03-08: 23:00:00 via INTRAVENOUS

## 2015-03-08 MED ORDER — HYDROMORPHONE HCL 1 MG/ML IJ SOLN: 0.5000 mg | Freq: Once | INTRAMUSCULAR | Status: DC

## 2015-03-08 MED ORDER — ONDANSETRON HCL 4 MG/2ML IJ SOLN
4.0000 mg | Freq: Once | INTRAMUSCULAR | Status: AC
  Administered 2015-03-08: 4 mg via INTRAVENOUS
  Filled 2015-03-08: qty 2

## 2015-03-08 MED ORDER — INSULIN ASPART 100 UNIT/ML ~~LOC~~ SOLN
SUBCUTANEOUS | Status: AC
  Filled 2015-03-08: qty 5

## 2015-03-08 MED ORDER — ONDANSETRON HCL 4 MG PO TABS: 4.0000 mg | ORAL_TABLET | Freq: Four times a day (QID) | ORAL | Status: DC | PRN

## 2015-03-08 MED ORDER — ALUM & MAG HYDROXIDE-SIMETH 200-200-20 MG/5ML PO SUSP
ORAL | Status: AC
  Administered 2015-03-08: 30 mL
  Filled 2015-03-08: qty 30

## 2015-03-08 MED ORDER — INSULIN ASPART 100 UNIT/ML ~~LOC~~ SOLN
0.0000 [IU] | Freq: Three times a day (TID) | SUBCUTANEOUS | Status: DC
  Administered 2015-03-09: 3 [IU] via SUBCUTANEOUS
  Administered 2015-03-09: 5 [IU] via SUBCUTANEOUS
  Administered 2015-03-09: 7 [IU] via SUBCUTANEOUS
  Administered 2015-03-10: 3 [IU] via SUBCUTANEOUS
  Filled 2015-03-08: qty 5
  Filled 2015-03-08: qty 3
  Filled 2015-03-08: qty 7
  Filled 2015-03-08: qty 3

## 2015-03-08 MED ORDER — INSULIN ASPART 100 UNIT/ML ~~LOC~~ SOLN
0.0000 [IU] | Freq: Every day | SUBCUTANEOUS | Status: DC
  Administered 2015-03-09: 4 [IU] via SUBCUTANEOUS
  Administered 2015-03-09: 3 [IU] via SUBCUTANEOUS
  Filled 2015-03-08: qty 3
  Filled 2015-03-08: qty 4

## 2015-03-08 MED ORDER — ALUM & MAG HYDROXIDE-SIMETH 200-200-20 MG/5ML PO SUSP: 30.0000 mL | ORAL | Status: DC | PRN

## 2015-03-08 MED ORDER — CLONIDINE HCL 0.1 MG PO TABS
0.2000 mg | ORAL_TABLET | Freq: Two times a day (BID) | ORAL | Status: DC
  Administered 2015-03-09 – 2015-03-10 (×4): 0.2 mg via ORAL
  Filled 2015-03-08 (×4): qty 2

## 2015-03-08 MED ORDER — ALUM & MAG HYDROXIDE-SIMETH 200-200-20 MG/5ML PO SUSP
30.0000 mL | Freq: Once | ORAL | Status: AC
  Filled 2015-03-08: qty 30

## 2015-03-08 MED ORDER — DOCUSATE SODIUM 100 MG PO CAPS
100.0000 mg | ORAL_CAPSULE | Freq: Two times a day (BID) | ORAL | Status: DC
  Administered 2015-03-09 – 2015-03-10 (×3): 100 mg via ORAL
  Filled 2015-03-08 (×3): qty 1

## 2015-03-08 MED ORDER — MORPHINE SULFATE (PF) 2 MG/ML IV SOLN
2.0000 mg | INTRAVENOUS | Status: DC | PRN
  Administered 2015-03-09: 2 mg via INTRAVENOUS
  Filled 2015-03-08: qty 1

## 2015-03-08 MED ORDER — HYDROMORPHONE HCL 1 MG/ML IJ SOLN
INTRAMUSCULAR | Status: AC
  Filled 2015-03-08: qty 1

## 2015-03-08 MED ORDER — MORPHINE SULFATE (PF) 4 MG/ML IV SOLN
4.0000 mg | Freq: Once | INTRAVENOUS | Status: AC
  Administered 2015-03-08: 4 mg via INTRAVENOUS
  Filled 2015-03-08: qty 1

## 2015-03-08 MED ORDER — AMLODIPINE BESYLATE 10 MG PO TABS
10.0000 mg | ORAL_TABLET | Freq: Every day | ORAL | Status: DC
  Administered 2015-03-09 – 2015-03-10 (×2): 10 mg via ORAL
  Filled 2015-03-08 (×2): qty 1

## 2015-03-08 MED ORDER — DICYCLOMINE HCL 10 MG/ML IM SOLN
INTRAMUSCULAR | Status: AC
  Administered 2015-03-08: 20 mg via INTRAMUSCULAR
  Filled 2015-03-08: qty 2

## 2015-03-08 MED ORDER — ACETAMINOPHEN 650 MG RE SUPP: 650.0000 mg | Freq: Four times a day (QID) | RECTAL | Status: DC | PRN

## 2015-03-08 MED ORDER — DICYCLOMINE HCL 10 MG/ML IM SOLN
20.0000 mg | Freq: Once | INTRAMUSCULAR | Status: AC
  Administered 2015-03-08: 20 mg via INTRAMUSCULAR
  Filled 2015-03-08: qty 2

## 2015-03-08 MED ORDER — INSULIN ASPART 100 UNIT/ML ~~LOC~~ SOLN: 5.0000 [IU] | Freq: Once | SUBCUTANEOUS | Status: DC

## 2015-03-08 MED ORDER — PANTOPRAZOLE SODIUM 20 MG PO TBEC
20.0000 mg | DELAYED_RELEASE_TABLET | Freq: Two times a day (BID) | ORAL | Status: DC
  Filled 2015-03-08 (×2): qty 1

## 2015-03-08 MED ORDER — GI COCKTAIL ~~LOC~~
30.0000 mL | Freq: Once | ORAL | Status: AC
  Administered 2015-03-08: 30 mL via ORAL
  Filled 2015-03-08: qty 30

## 2015-03-08 MED ORDER — CYCLOBENZAPRINE HCL 10 MG PO TABS: 10.0000 mg | ORAL_TABLET | Freq: Three times a day (TID) | ORAL | Status: DC | PRN

## 2015-03-08 MED ORDER — PANTOPRAZOLE SODIUM 40 MG IV SOLR
40.0000 mg | Freq: Once | INTRAVENOUS | Status: AC
  Administered 2015-03-09: 40 mg via INTRAVENOUS
  Filled 2015-03-08: qty 40

## 2015-03-08 MED ORDER — SODIUM CHLORIDE 0.9 % IV SOLN
1000.0000 mL | Freq: Once | INTRAVENOUS | Status: AC
  Administered 2015-03-08: 1000 mL via INTRAVENOUS

## 2015-03-08 MED ORDER — ASPIRIN 81 MG PO CHEW
81.0000 mg | CHEWABLE_TABLET | Freq: Every day | ORAL | Status: DC
  Administered 2015-03-09: 81 mg via ORAL
  Filled 2015-03-08: qty 1

## 2015-03-08 MED ORDER — INSULIN GLARGINE 100 UNITS/ML SOLOSTAR PEN: 20.0000 [IU] | PEN_INJECTOR | Freq: Two times a day (BID) | SUBCUTANEOUS | Status: DC

## 2015-03-08 MED ORDER — OXYCODONE HCL 5 MG PO TABS
5.0000 mg | ORAL_TABLET | ORAL | Status: DC | PRN
  Administered 2015-03-09: 5 mg via ORAL
  Filled 2015-03-08: qty 1

## 2015-03-08 MED ORDER — HYDROMORPHONE HCL 1 MG/ML IJ SOLN
0.5000 mg | Freq: Once | INTRAMUSCULAR | Status: AC
  Administered 2015-03-08: 0.5 mg via INTRAVENOUS
  Filled 2015-03-08: qty 1

## 2015-03-08 MED ORDER — ACETAMINOPHEN 325 MG PO TABS: 650.0000 mg | ORAL_TABLET | Freq: Four times a day (QID) | ORAL | Status: DC | PRN

## 2015-03-08 MED ORDER — HYDRALAZINE HCL 20 MG/ML IJ SOLN: 10.0000 mg | INTRAMUSCULAR | Status: DC | PRN

## 2015-03-08 MED ORDER — LORAZEPAM 2 MG/ML IJ SOLN
2.0000 mg | Freq: Once | INTRAMUSCULAR | Status: AC
  Administered 2015-03-08: 2 mg via INTRAVENOUS
  Filled 2015-03-08: qty 1

## 2015-03-08 MED ORDER — POLYETHYLENE GLYCOL 3350 17 G PO PACK: 17.0000 g | PACK | Freq: Every day | ORAL | Status: DC | PRN

## 2015-03-08 MED ORDER — HEPARIN SODIUM (PORCINE) 5000 UNIT/ML IJ SOLN
5000.0000 [IU] | Freq: Three times a day (TID) | INTRAMUSCULAR | Status: DC
  Administered 2015-03-09 (×2): 5000 [IU] via SUBCUTANEOUS
  Filled 2015-03-08 (×3): qty 1

## 2015-03-08 MED ORDER — HYDROMORPHONE HCL 1 MG/ML IJ SOLN
1.0000 mg | Freq: Once | INTRAMUSCULAR | Status: AC
  Administered 2015-03-08: 1 mg via INTRAVENOUS
  Filled 2015-03-08: qty 1

## 2015-03-08 MED ORDER — MORPHINE SULFATE (PF) 2 MG/ML IV SOLN
2.0000 mg | Freq: Once | INTRAVENOUS | Status: DC
  Filled 2015-03-08: qty 1

## 2015-03-08 MED ORDER — ATORVASTATIN CALCIUM 20 MG PO TABS
40.0000 mg | ORAL_TABLET | Freq: Every evening | ORAL | Status: DC
  Administered 2015-03-09: 40 mg via ORAL
  Filled 2015-03-08: qty 2

## 2015-03-08 MED ORDER — SODIUM CHLORIDE 0.9 % IV SOLN
INTRAVENOUS | Status: DC
  Administered 2015-03-09 (×3): via INTRAVENOUS

## 2015-03-08 MED ORDER — MELOXICAM 7.5 MG PO TABS
15.0000 mg | ORAL_TABLET | Freq: Every day | ORAL | Status: DC
  Administered 2015-03-09: 15 mg via ORAL
  Filled 2015-03-08: qty 2

## 2015-03-08 MED ORDER — CLOPIDOGREL BISULFATE 75 MG PO TABS
75.0000 mg | ORAL_TABLET | Freq: Every day | ORAL | Status: DC
  Administered 2015-03-09: 75 mg via ORAL
  Filled 2015-03-08: qty 1

## 2015-03-08 NOTE — ED Notes (Signed)
Pt complaining that "no one is coming and checking on me" - reminded him that i have been in multiple times to answer his call bell and that i was in again with this call bell. Advised him the doctor is aware and will come in shortly.

## 2015-03-08 NOTE — ED Notes (Signed)
Pt calling again for pain meds - again explained dr in with emergency. Pt had told secretary he wanted to leave and to take the iv out. But pt agreed to let me get him some maalox while he awaits dr.

## 2015-03-08 NOTE — ED Notes (Signed)
Report from susan, rn. Sue Lushndrea, rn in to medicate pt.

## 2015-03-08 NOTE — ED Notes (Signed)
Pt medicated - pt again complaining that we are treating him badly - asked him in what way. He doesn't believe we are medicating him enough. Went over the medicines he has received and reminded him that i just medicated him again and that we were doing different things trying to help him. (medications, heating pad, turning down the lights).

## 2015-03-08 NOTE — ED Notes (Signed)
Pt requesting pain medication - will inform dr.
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
## 2015-03-08 NOTE — ED Notes (Signed)
Wife asking to speak with someone in charge. Charge nurse made aware of everything that has transpired. Alfredia ClientMary jo will also go in and speak with pt and family.

## 2015-03-08 NOTE — ED Notes (Signed)
Pt discharged yesterday with gastroenteritis. Was eating pinto beans and began to have stomach cramps and pain. Educated pt on gastroenteritis and the foods to eat.

## 2015-03-08 NOTE — ED Notes (Signed)
Pt's spouse states pt received insulin from susan, rn previously. Insulin not signed on mar, pt unsure if insulin received. No insulin administered by this rn.

## 2015-03-08 NOTE — ED Notes (Signed)
Pt given a warm pack to use as a heating pad on his stomach and tried to make pt comfortable while awaiting the medication to work

## 2015-03-08 NOTE — ED Notes (Signed)
Pt complaining that the medication (dilaudid and gi cocktail) not working. Dr in with pt - will let him know
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
## 2015-03-08 NOTE — ED Notes (Signed)
Pt refused the dilaudid and requesting morphine. Advised i would ask the doctor but shouldn't be a problem. Attempted to give 5 of insulin but this 2nd iv is infiltrated. Admitting dr made aware.

## 2015-03-08 NOTE — ED Notes (Signed)
Patient states that as he was eating he experienced severe abdominal pain, nausea and vomiting. Patient was discharged yesterday from this hospital for same complaint.

## 2015-03-09 LAB — CBC
HEMATOCRIT: 39.7 % — AB (ref 40.0–52.0)
HEMOGLOBIN: 13.9 g/dL (ref 13.0–18.0)
MCH: 30.5 pg (ref 26.0–34.0)
MCHC: 35.2 g/dL (ref 32.0–36.0)
MCV: 86.7 fL (ref 80.0–100.0)
Platelets: 295 10*3/uL (ref 150–440)
RBC: 4.57 MIL/uL (ref 4.40–5.90)
RDW: 13.5 % (ref 11.5–14.5)
WBC: 8.8 10*3/uL (ref 3.8–10.6)

## 2015-03-09 LAB — COMPREHENSIVE METABOLIC PANEL
ALBUMIN: 4 g/dL (ref 3.5–5.0)
ALT: 16 U/L — ABNORMAL LOW (ref 17–63)
ANION GAP: 9 (ref 5–15)
AST: 17 U/L (ref 15–41)
Alkaline Phosphatase: 54 U/L (ref 38–126)
BUN: 15 mg/dL (ref 6–20)
CO2: 25 mmol/L (ref 22–32)
Calcium: 9.2 mg/dL (ref 8.9–10.3)
Chloride: 100 mmol/L — ABNORMAL LOW (ref 101–111)
Creatinine, Ser: 0.97 mg/dL (ref 0.61–1.24)
GFR calc non Af Amer: >60 mL/min (ref >60)
GLUCOSE: 323 mg/dL — AB (ref 65–99)
POTASSIUM: 3.5 mmol/L (ref 3.5–5.1)
SODIUM: 134 mmol/L — AB (ref 135–145)
TOTAL PROTEIN: 7.8 g/dL (ref 6.5–8.1)
Total Bilirubin: 1.5 mg/dL — ABNORMAL HIGH (ref 0.3–1.2)

## 2015-03-09 LAB — GLUCOSE, CAPILLARY
GLUCOSE-CAPILLARY: 303 mg/dL — AB (ref 65–99)
GLUCOSE-CAPILLARY: 275 mg/dL — AB (ref 65–99)
GLUCOSE-CAPILLARY: 249 mg/dL — AB (ref 65–99)
Glucose-Capillary: 266 mg/dL — ABNORMAL HIGH (ref 65–99)
Glucose-Capillary: 313 mg/dL — ABNORMAL HIGH (ref 65–99)

## 2015-03-09 MED ORDER — ENOXAPARIN SODIUM 40 MG/0.4ML ~~LOC~~ SOLN
40.0000 mg | SUBCUTANEOUS | Status: DC
  Administered 2015-03-09: 40 mg via SUBCUTANEOUS
  Filled 2015-03-09 (×2): qty 0.4

## 2015-03-09 MED ORDER — PANTOPRAZOLE SODIUM 40 MG PO TBEC
40.0000 mg | DELAYED_RELEASE_TABLET | Freq: Two times a day (BID) | ORAL | Status: DC
  Administered 2015-03-09 – 2015-03-10 (×3): 40 mg via ORAL
  Filled 2015-03-09 (×3): qty 1

## 2015-03-09 NOTE — Consult Note (Signed)
Patient seen and examined, chart reviewed. Patient admitted with epigastric pain nausea and vomiting. In discussion with the patient he does take meloxicam daily as well as other NSAIDs. He has not been seeing black bowel movements. I did an extensive review of his anticoagulant history as there is a history of cardiac stents and the use of Plavix and Effient. He was previously on Effient in 2012 this then was supposed to change to Plavix. He doesn't know if these taken the Plavix over the period of the past month. However staff states that he was given a dose of Plavix this morning. He has not seen his cardiologist for about 2 years. Agent and his wife are uncertain as to what medications he does take.  Agree with the need for doing an EGD. However medication history cannot be sure he has not been taking Plavix regularly and indeed he received a dose this morning per staff. As such we would not be able to proceed with the EGD for several more days. Recommend that anticoagulants be held for now, all NSAIDs be held for now, continue twice a day PPI, we'll arrange for outpatient in office visit later this week to arrange for EGD this coming Monday or so.  Discussed the anticoagulants with his cardiologist and we feel this will be okay to discontinue for now through the procedure. He will need a cardiology follow-up as well and Dr. Windell HummingbirdGollan's office will be calling him to arrange this.

## 2015-03-09 NOTE — Care Management Note (Addendum)
Case Management Note  Patient Details  Name: Steve Alvarado MRN: [id number] Date of Birth: 1955-04-16  Subjective/Objective:     59yo Steve Alvarado was admitted 03/08/15 with severe abdominal pain. Was discharged 03/07/15 after tx for gastroenteritis. Hx: Diabetes, CAD. Resides at home with his wife. PCP=[facility]. Pharmacy=[facility] on [address] Steve Alvarado is sleeping soundly and Steve Alvarado reports that he has a rolling walker and a bedside commode at home. Steve Alvarado has no home oxygen, no home health services. Wife provides transportation. Case management will follow for discharge planning.                 Action/Plan:   Expected Discharge Date:                  Expected Discharge Plan:     In-House Referral:     Discharge planning Services     Post Acute Care Choice:    Choice offered to:     DME Arranged:    DME Agency:     HH Arranged:    HH Agency:     Status of Service:     Medicare Important Message Given:    Date Medicare IM Given:    Medicare IM give by:    Date Additional Medicare IM Given:    Additional Medicare Important Message give by:     If discussed at Long Length of Stay Meetings, dates discussed:    Additional Comments:  Steve Alvarado A, RN 03/09/2015, 10:55 AM

## 2015-03-09 NOTE — Progress Notes (Signed)
[facility] - [place] at [facility]   PATIENT NAME: Steve Alvarado    MR#:  [id number]  DATE OF BIRTH:  11/20/1955  SUBJECTIVE:  Patient admitted after he started having intractable epigastric abdominal pain after eating some beans and chicken at [employer]. Patient was just discharged 03/07/2015 after he had indigestion from eating [place] leg. At several rounds of IV pain medication in the emergency room. Currently he is pain-free. Wife in the room.  REVIEW OF SYSTEMS:   Review of Systems  Constitutional: Negative for fever, chills and weight loss.  HENT: Negative for ear discharge, ear pain and nosebleeds.   Eyes: Negative for blurred vision, pain and discharge.  Respiratory: Negative for sputum production, shortness of breath, wheezing and stridor.   Cardiovascular: Negative for chest pain, palpitations, orthopnea and PND.  Gastrointestinal: Positive for heartburn, vomiting and abdominal pain. Negative for nausea and diarrhea.  Genitourinary: Negative for urgency and frequency.  Musculoskeletal: Negative for back pain and joint pain.  Neurological: Positive for weakness. Negative for sensory change, speech change and focal weakness.  Psychiatric/Behavioral: Negative for depression and hallucinations. The patient is not nervous/anxious.   All other systems reviewed and are negative.  Tolerating Diet:clears Tolerating PT: not needed  DRUG ALLERGIES:  No Known Allergies  VITALS:  Blood pressure 124/71, pulse 73, temperature 98.9 F (37.2 C), temperature source Oral, resp. rate 16, height  (1.88 m), weight 87.091 kg (192 lb), SpO2 99 %.  PHYSICAL EXAMINATION:   Physical Exam  GENERAL:  59 y.o.-year-old patient lying in the bed with no acute distress.  EYES: Pupils equal, round, reactive to light and accommodation. No scleral icterus. Extraocular muscles intact.  HEENT: Head atraumatic, normocephalic. Oropharynx and nasopharynx clear.  NECK:   Supple, no jugular venous distention. No thyroid enlargement, no tenderness.  LUNGS: Normal breath sounds bilaterally, no wheezing, rales, rhonchi. No use of accessory muscles of respiration.  CARDIOVASCULAR: S1, S2 normal. No murmurs, rubs, or gallops.  ABDOMEN: Soft, nontender, nondistended. Bowel sounds present. No organomegaly or mass.  EXTREMITIES: No cyanosis, clubbing or edema b/l.    NEUROLOGIC: Cranial nerves II through XII are intact. No focal Motor or sensory deficits b/l.   PSYCHIATRIC: The patient is alert and oriented x 3.  SKIN: No obvious rash, lesion, or ulcer.    LABORATORY PANEL:   CBC  Recent Labs Lab 03/09/15 0311  WBC 8.8  HGB 13.9  HCT 39.7*  PLT 295    Chemistries   Recent Labs Lab 03/09/15 0311  NA 134*  K 3.5  CL 100*  CO2 25  GLUCOSE 323*  BUN 15  CREATININE 0.97  CALCIUM 9.2  AST 17  ALT 16*  ALKPHOS 54  BILITOT 1.5*    Cardiac Enzymes  Recent Labs Lab 03/08/15 2043  TROPONINI <0.03    RADIOLOGY:  Dg Abd Acute W/chest  03/08/2015  CLINICAL DATA:  59 year old male with epigastric pain and vomiting and diarrhea x2 days EXAM: DG ABDOMEN ACUTE W/ 1V CHEST COMPARISON:  CT dated 03/07/2015 FINDINGS: There is no evidence of dilated bowel loops or free intraperitoneal air. No radiopaque calculi or other significant radiographic abnormality is seen. Heart size and mediastinal contours are within normal limits. Both lungs are clear. IMPRESSION: Negative abdominal radiographs.  No acute cardiopulmonary disease. Electronically Signed   By: Elgie Collard M.D.   On: 03/08/2015 20:28     ASSESSMENT AND PLAN:   59 year old African-American gentleman history of type 2 diabetes insulin requiring as  well as essential hypertension who is presenting with abdominal pain.  1. Intractable abdominal pain: Provide supportive care no acute findings on CT or laboratory data. IV fluid hydration bowel regiment, PPI therapy -Even recurrent admission with  similar symptoms and patient not been able to tolerate certain kind of foods along with history of being on Plavix aspirin and meloxicam as discussed with GI for possibility of luminal evaluation for peptic ulcer disease.  2. Hypertensive urgency: Restart home medications added at some needed hydralazine  3. Type 2 diabetes insulin requiring continue Lantus and sliding scale coverage  4. Hyperlipidemia unspecified Lipitor  5. Venous thromboembolic prophylactic: Heparin subcutaneous  Case discussed with Care Management/Social Worker. Management plans discussed with the patient, family and they are in agreement.  CODE STATUS: full code  DVT Prophylaxis: lovenox  TOTAL TIME TAKING CARE OF THIS PATIENT: 30 minutes.  >50% time spent on counselling and coordination of care pt., wife and dr Marva Pandaskulskie.  POSSIBLE D/C IN 1-2 DAYS, DEPENDING ON CLINICAL CONDITION.   Sheral Pfahler M.D on 03/09/2015 at 12:36 PM  Between 7am to 6pm - Pager - [phone]  After 6pm go to [email or web address] - password [email or web address]  [facility]  Office  [phone]  CC: Primary care physician; Pcp Not In System

## 2015-03-09 NOTE — Consult Note (Signed)
Consultation  Referring Provider:     Dr Allena KatzPatel Admit date: 03/08/15 Consult date        : 03/09/15 Reason for Consultation:     Epigastric pain         HPI:   Steve Alvarado is a 59 y.o. male  With history of diabetes, htn. CAD, HP, and cardiac stenting 2012 with plavix therapy due to MI (follows with Dr Mariah MillingGollan). He has been admitted with epigastric pain:   Was seen in ED for abdominal pain, NV 12/17: suspected to have viral gastroenteritis and discharged back home. Presented again to the ED yesterday with intractable 10/10 epigastric pain, NV after eating at [employer]. CT abdomen/pelvis with some hepatic steatosis, otherwise unremarkable. CBC, lfts, bun/creatinine normal.    It was noted he has been out of insulin, and his blood sugars have been quite elevated. He is also on meloxicam qd.  Patient reports his epigastric pain began immediately after eating and was associated with NV. Denies hematemesis/melena/ hematochezia. States his pain did not radiate anywhere. Is feeling better now after fluids/pain meds. He is on bid Pantoprazole 40mg . He has no further GI related complaints. There is no history of luminal evaluation. He thinks he had a dose of plavix today, but is unsure otherwise when his last one was. There is no record in his chart of this being ordered during this hospitalization.   Past Medical History  Diagnosis Date  . Hypertension   . Coronary artery disease   . Diabetes mellitus     Type II  . Hyperlipidemia   MI 2012  Past Surgical History  Procedure Laterality Date  . Hand surgery      left  . Cardiac catheterization  08/06/2010    Bare metal stent placed in RCA.    Family History  Problem Relation Age of Onset  . Heart attack Mother   No colorectal cancer, colon polyps, liver disease, PUD.  Social History  Substance Use Topics  . Smoking status: Never Smoker   . Smokeless tobacco: Never Used  . Alcohol Use: No    Prior to Admission medications    Medication Sig Start Date End Date Taking? Authorizing Provider  amLODipine (NORVASC) 10 MG tablet Take 1 tablet (10 mg total) by mouth daily. 10/30/14 01/27/16 Yes Darci Currentandolph N Brown, MD  aspirin 81 MG tablet Take 81 mg by mouth daily.   Yes Historical Provider, MD  atorvastatin (LIPITOR) 40 MG tablet Take 40 mg by mouth every evening.   Yes Historical Provider, MD  cloNIDine (CATAPRES) 0.2 MG tablet Take 1 tablet (0.2 mg total) by mouth 2 (two) times daily. 10/30/14  Yes Darci Currentandolph N Brown, MD  clopidogrel (PLAVIX) 75 MG tablet Take 75 mg by mouth daily.   Yes Historical Provider, MD  cyclobenzaprine (FLEXERIL) 10 MG tablet Take 1 tablet (10 mg total) by mouth every 8 (eight) hours as needed for muscle spasms. 10/30/14 10/30/15 Yes Darci Currentandolph N Brown, MD  insulin glargine (LANTUS) 100 unit/mL SOPN Inject 20 Units into the skin 2 (two) times daily.   Yes Historical Provider, MD  meloxicam (MOBIC) 15 MG tablet Take 1 tablet (15 mg total) by mouth daily. 12/22/14  Yes Christiane HaJonathan D Cuthriell, PA-C  metFORMIN (GLUCOPHAGE) 1000 MG tablet Take 1 tablet (1,000 mg total) by mouth 2 (two) times daily. Patient taking differently: Take 1,000 mg by mouth daily.  10/20/11  Yes Antonieta Ibaimothy J Gollan, MD    Current Facility-Administered Medications  Medication Dose Route Frequency Provider  Last Rate Last Dose  . 0.9 %  sodium chloride infusion   Intravenous Continuous Enedina Finner, MD 75 mL/hr at 03/09/15 1030    . acetaminophen (TYLENOL) tablet 650 mg  650 mg Oral Q6H PRN Wyatt Haste, MD       Or  . acetaminophen (TYLENOL) suppository 650 mg  650 mg Rectal Q6H PRN Wyatt Haste, MD      . alum & mag hydroxide-simeth (MAALOX/MYLANTA) 200-200-20 MG/5ML suspension 30 mL  30 mL Oral Q4H PRN Wyatt Haste, MD      . amLODipine (NORVASC) tablet 10 mg  10 mg Oral Daily Wyatt Haste, MD   10 mg at 03/09/15 1610  . atorvastatin (LIPITOR) tablet 40 mg  40 mg Oral QPM Wyatt Haste, MD      . cloNIDine (CATAPRES) tablet 0.2 mg  0.2 mg  Oral BID Wyatt Haste, MD   0.2 mg at 03/09/15 9604  . cyclobenzaprine (FLEXERIL) tablet 10 mg  10 mg Oral Q8H PRN Wyatt Haste, MD      . docusate sodium (COLACE) capsule 100 mg  100 mg Oral BID Wyatt Haste, MD   100 mg at 03/09/15 5409  . enoxaparin (LOVENOX) injection 40 mg  40 mg Subcutaneous Q24H Enedina Finner, MD      . hydrALAZINE (APRESOLINE) injection 10 mg  10 mg Intravenous Q4H PRN Wyatt Haste, MD      . HYDROmorphone (DILAUDID) injection 0.5 mg  0.5 mg Intravenous Once Arnaldo Natal, MD   0 mg at 03/08/15 2255  . insulin aspart (novoLOG) injection 0-5 Units  0-5 Units Subcutaneous QHS Wyatt Haste, MD   4 Units at 03/09/15 0031  . insulin aspart (novoLOG) injection 0-9 Units  0-9 Units Subcutaneous TID WC Wyatt Haste, MD   5 Units at 03/09/15 1125  . insulin aspart (novoLOG) injection 5 Units  5 Units Intravenous Once Arnaldo Natal, MD   5 Units at 03/08/15 2215  . insulin glargine (LANTUS) injection 20 Units  20 Units Subcutaneous BID Wyatt Haste, MD   20 Units at 03/09/15 1108  . morphine 2 MG/ML injection 2 mg  2 mg Intravenous Q4H PRN Wyatt Haste, MD      . ondansetron [facility]) tablet 4 mg  4 mg Oral Q6H PRN Wyatt Haste, MD       Or  . ondansetron [facility]) injection 4 mg  4 mg Intravenous Q6H PRN Wyatt Haste, MD      . oxyCODONE (Oxy IR/ROXICODONE) immediate release tablet 5 mg  5 mg Oral Q4H PRN Wyatt Haste, MD      . pantoprazole (PROTONIX) EC tablet 40 mg  40 mg Oral BID Wyatt Haste, MD   40 mg at 03/09/15 8119  . polyethylene glycol (MIRALAX / GLYCOLAX) packet 17 g  17 g Oral Daily PRN Wyatt Haste, MD        Allergies as of 03/08/2015  . (No Known Allergies)     Review of Systems:    All systems reviewed and negative except where noted in HPI.      Physical Exam:  Vital signs in last 24 hours: Temp:  [98.2 F (36.8 C)-99.9 F (37.7 C)] 98.9 F (37.2 C) (12/19 0743) Pulse Rate:  [73-100] 73 (12/19 0743) Resp:  [16-25] 16 (12/19 0743) BP:  (123-196)/(71-168) 124/71 mmHg (12/19 0743) SpO2:  [94 %-100 %] 99 % (12/19 0743) Weight:  [  87.091 kg (192 lb)-92.08 kg (203 lb)] 87.091 kg (192 lb) (12/18 2352) Last BM Date: 03/06/15 General:   Pleasant man in NAD Head:  Normocephalic and atraumatic. Eyes:   No icterus.   Conjunctiva pink. Ears:  Normal auditory acuity. Mouth: Mucosa pink moist, no lesions. Neck:  Supple; no masses felt Lungs:  Respirations even and unlabored. Lungs clear to auscultation bilaterally.   No wheezes, crackles, or rhonchi.  Heart:  S1S2, RRR, no MRG. No edema. Abdomen:   Flat, soft, nondistended, nontender. Normal bowel sounds. No appreciable masses or hepatomegaly. No rebound signs or other peritoneal signs.  Msk:  MAEW x4, No clubbing or cyanosis. Strength 5/5. Symmetrical without gross deformities. Neurologic:  Alert and  oriented x4;  Cranial nerves II-XII intact.  Skin:  Warm, dry, pink without significant lesions or rashes. Psych:  Alert and cooperative. Normal affect.  LAB RESULTS:  Recent Labs  03/06/15 2351 03/08/15 1645 03/09/15 0311  WBC 7.8 8.9 8.8  HGB 14.2 14.4 13.9  HCT 42.3 41.6 39.7*  PLT 328 287 295   BMET  Recent Labs  03/06/15 2351 03/08/15 1645 03/09/15 0311  NA 130* 132* 134*  K 4.2 3.3* 3.5  CL 98* 99* 100*  CO2 24 20* 25  GLUCOSE 569* 445* 323*  BUN CREATININE 1.07 1.03 0.97  CALCIUM 9.6 9.7 9.2   LFT  Recent Labs  03/09/15 0311  PROT 7.8  ALBUMIN 4.0  AST 17  ALT 16*  ALKPHOS 54  BILITOT 1.5*   PT/INR No results for input(s): LABPROT, INR in the last 72 hours.  STUDIES: Dg Abd Acute W/chest  03/08/2015  CLINICAL DATA:  59 year old male with epigastric pain and vomiting and diarrhea x2 days EXAM: DG ABDOMEN ACUTE W/ 1V CHEST COMPARISON:  CT dated 03/07/2015 FINDINGS: There is no evidence of dilated bowel loops or free intraperitoneal air. No radiopaque calculi or other significant radiographic abnormality is seen. Heart size and  mediastinal contours are within normal limits. Both lungs are clear. IMPRESSION: Negative abdominal radiographs.  No acute cardiopulmonary disease. Electronically Signed   By: Elgie Collard M.D.   On: 03/08/2015 20:28       Impression / Plan:   1. Abdominal pain/dyspepsia: chronic NSAID use with anticoagulation, uncontrolled DM. Agree with PPI therapy. PUD and gastroparesis are included in his Ddx. Will discuss EGD with Dr Marva Panda, although when he last took his plavix will need to be determined. He should also have a routine colonoscopy, but can do this as outpatient. Recommend better control of DM- may benefit from nutritionist and medication assistance. Thank you very much for this consult. These services were provided by Vevelyn Pat, NP-C, in collaboration with Christena Deem, MD, with whom I have discussed this patient in full.   Vevelyn Pat, NP-C  Addendum: did discuss case with Dr Marva Panda, who also spoke with Dr Mariah Milling- plan for now is to hold plavix, see patient in office later this week and perform EGD next week to ensure there are no problems from plavix, as it is unknown when he had this last.

## 2015-03-10 ENCOUNTER — Encounter (INDEPENDENT_AMBULATORY_CARE_PROVIDER_SITE_OTHER): Payer: Self-pay

## 2015-03-10 ENCOUNTER — Ambulatory Visit: Payer: Self-pay

## 2015-03-10 LAB — GLUCOSE, CAPILLARY: GLUCOSE-CAPILLARY: 202 mg/dL — AB (ref 65–99)

## 2015-03-10 MED ORDER — PANTOPRAZOLE SODIUM 40 MG PO TBEC: 40.0000 mg | DELAYED_RELEASE_TABLET | Freq: Two times a day (BID) | ORAL | Status: DC

## 2015-03-10 NOTE — Care Management (Signed)
Patient admitted from home with intractable vomiting.  Patient lives at home with his sister.  Patient states that he has no source of income.  Plan for patient to discharge today.  Patient states that he uses Lantus at home, and will not have the funds to get it until Friday.  Patient states that he has tried to use [facility]ication [facility] in the past, but was told it showed he had active Medicaid.  Patient states that he does not have Medicaid.  Patient is an active patient at [facility].  I called and spoke with Medication Management.  They do not have any Lantus in stock, but would be able to provide Levimer today.  Dr. Allena KatzPatel wrote rx for Levimer.  Rx provided to patient, and instructed to go to Medication Management at time of discharge.  Patient very appreciative.

## 2015-03-10 NOTE — Discharge Instructions (Signed)
Soft bland diet Avoid fried and spicy food!!

## 2015-03-10 NOTE — Progress Notes (Signed)
Discharge instructions and perscriptions given to pt discharged via sheelchair

## 2015-03-10 NOTE — Discharge Summary (Signed)
[facility] - [place] at [facility]   PATIENT NAME: Steve Alvarado    MR#:  [id number]  DATE OF BIRTH:  [birth year]  DATE OF ADMISSION:  03/08/2015 ADMITTING PHYSICIAN: Wyatt Haste, MD  DATE OF DISCHARGE: 03/10/15  PRIMARY CARE PHYSICIAN: Pcp Not In System    ADMISSION DIAGNOSIS:  abd pain nausea vomiting  DISCHARGE DIAGNOSIS:  Epigastric pain likely due to PUD (EGD as outpt) DM-2 HTN  SECONDARY DIAGNOSIS:   Past Medical History  Diagnosis Date  . Hypertension   . Coronary artery disease   . Diabetes mellitus     Type II  . Hyperlipidemia     HOSPITAL COURSE:  59 year old African-American gentleman history of type 2 diabetes insulin requiring as well as essential hypertension who is presenting with abdominal pain.  1. Intractable abdominal pain: Provide supportive care no acute findings on CT or laboratory data. -PPI therapy -Even recurrent admission with similar symptoms and patient not been able to tolerate certain kind of foods along with history of being on Plavix aspirin and meloxicam as discussed with GI for possibility of luminal evaluation for peptic ulcer disease. -pt seen by GI Dr Marva Panda- recommends outpt EGD after stopping plavix for few days (already stopped by me ) -bland diet -avoid NSAIDS and I have stopped his meloxicam.  2. Hypertensive urgency: Restart home medications   3. Type 2 diabetes insulin requiring continue Lantus and sliding scale coverage  4. Hyperlipidemia unspecified Lipitor  5. Venous thromboembolic prophylactic: Heparin subcutaneous  Overall stable for d/c with outpt EGD Pt voiced understanding  CONSULTS OBTAINED:  Treatment Team:  Wyatt Haste, MD Christena Deem, MD  DRUG ALLERGIES:  No Known Allergies  DISCHARGE MEDICATIONS:   Current Discharge Medication List    START taking these medications   Details  pantoprazole (PROTONIX) 40 MG tablet Take 1 tablet (40 mg total) by mouth 2  (two) times daily. Qty: 60 tablet, Refills: 2      CONTINUE these medications which have NOT CHANGED   Details  amLODipine (NORVASC) 10 MG tablet Take 1 tablet (10 mg total) by mouth daily. Qty: 30 tablet, Refills: 6    atorvastatin (LIPITOR) 40 MG tablet Take 40 mg by mouth every evening.    cloNIDine (CATAPRES) 0.2 MG tablet Take 1 tablet (0.2 mg total) by mouth 2 (two) times daily. Qty: 60 tablet, Refills: 0    cyclobenzaprine (FLEXERIL) 10 MG tablet Take 1 tablet (10 mg total) by mouth every 8 (eight) hours as needed for muscle spasms. Qty: 30 tablet, Refills: 1    insulin glargine (LANTUS) 100 unit/mL SOPN Inject 20 Units into the skin 2 (two) times daily.    metFORMIN (GLUCOPHAGE) 1000 MG tablet Take 1 tablet (1,000 mg total) by mouth 2 (two) times daily. Qty: 60 tablet, Refills: 6      STOP taking these medications     aspirin 81 MG tablet      clopidogrel (PLAVIX) 75 MG tablet      meloxicam (MOBIC) 15 MG tablet         If you experience worsening of your admission symptoms, develop shortness of breath, life threatening emergency, suicidal or homicidal thoughts you must seek medical attention immediately by calling 911 or calling your MD immediately  if symptoms less severe.  You Must read complete instructions/literature along with all the possible adverse reactions/side effects for all the Medicines you take and that have been prescribed to you. Take any new Medicines after  you have completely understood and accept all the possible adverse reactions/side effects.   Please note  You were cared for by a hospitalist during your hospital stay. If you have any questions about your discharge medications or the care you received while you were in the hospital after you are discharged, you can call the unit and asked to speak with the hospitalist on call if the hospitalist that took care of you is not available. Once you are discharged, your primary care physician will  handle any further medical issues. Please note that NO REFILLS for any discharge medications will be authorized once you are discharged, as it is imperative that you return to your primary care physician (or establish a relationship with a primary care physician if you do not have one) for your aftercare needs so that they can reassess your need for medications and monitor your lab values. Today   SUBJECTIVE   No vomiting. Epigastric pain ok  VITAL SIGNS:  Blood pressure 112/65, pulse 62, temperature 97.8 F (36.6 C), temperature source Oral, resp. rate 18, height 6\' 2"  (1.88 m), weight 87.091 kg (192 lb), SpO2 99 %.  I/O:   Intake/Output Summary (Last 24 hours) at 03/10/15 0738 Last data filed at 03/10/15 0400  Gross per 24 hour  Intake 3272.5 ml  Output      0 ml  Net 3272.5 ml    PHYSICAL EXAMINATION:  GENERAL:  59 y.o.-year-old patient lying in the bed with no acute distress.  EYES: Pupils equal, round, reactive to light and accommodation. No scleral icterus. Extraocular muscles intact.  HEENT: Head atraumatic, normocephalic. Oropharynx and nasopharynx clear.  NECK:  Supple, no jugular venous distention. No thyroid enlargement, no tenderness.  LUNGS: Normal breath sounds bilaterally, no wheezing, rales,rhonchi or crepitation. No use of accessory muscles of respiration.  CARDIOVASCULAR: S1, S2 normal. No murmurs, rubs, or gallops.  ABDOMEN: Soft, non-tender, non-distended. Bowel sounds present. No organomegaly or mass.  EXTREMITIES: No pedal edema, cyanosis, or clubbing.  NEUROLOGIC: Cranial nerves II through XII are intact. Muscle strength 5/5 in all extremities. Sensation intact. Gait not checked.  PSYCHIATRIC:  patient is alert and oriented x 3.  SKIN: No obvious rash, lesion, or ulcer.   DATA REVIEW:   CBC   Recent Labs Lab 03/09/15 0311  WBC 8.8  HGB 13.9  HCT 39.7*  PLT 295    Chemistries   Recent Labs Lab 03/09/15 0311  NA 134*  K 3.5  CL 100*  CO2 25   GLUCOSE 323*  BUN 15  CREATININE 0.97  CALCIUM 9.2  AST 17  ALT 16*  ALKPHOS 54  BILITOT 1.5*    Microbiology Results   No results found for this or any previous visit (from the past 240 hour(s)).  RADIOLOGY:  Dg Abd Acute W/chest  03/08/2015  CLINICAL DATA:  59 year old male with epigastric pain and vomiting and diarrhea x2 days EXAM: DG ABDOMEN ACUTE W/ 1V CHEST COMPARISON:  CT dated 03/07/2015 FINDINGS: There is no evidence of dilated bowel loops or free intraperitoneal air. No radiopaque calculi or other significant radiographic abnormality is seen. Heart size and mediastinal contours are within normal limits. Both lungs are clear. IMPRESSION: Negative abdominal radiographs.  No acute cardiopulmonary disease. Electronically Signed   By: Elgie CollardArash  Radparvar M.D.   On: 03/08/2015 20:28     Management plans discussed with the patient, family and they are in agreement.  CODE STATUS:     Code Status Orders  Start     Ordered   03/08/15 2247  Full code   Continuous     03/08/15 2247      TOTAL TIME TAKING CARE OF THIS PATIENT:36minutes.    Steve Alvarado M.D on 03/10/2015 at 7:38 AM  Between 7am to 6pm - Pager - [phone] After 6pm go to [email or web address] - password [email or web address]  [facility]  Office  [phone]  CC: Primary care physician; Pcp Not In System

## 2015-03-11 ENCOUNTER — Emergency Department
Admission: EM | Admit: 2015-03-11 | Discharge: 2015-03-11 | Disposition: A | Discharge status: Other Acute Inpatient Hospital | Payer: Medicaid Other | Attending: Emergency Medicine | Admitting: Emergency Medicine

## 2015-03-11 ENCOUNTER — Encounter: Payer: Self-pay | Admitting: *Deleted

## 2015-03-11 DIAGNOSIS — R1010 Upper abdominal pain, unspecified: Secondary | ICD-10-CM | POA: Diagnosis present

## 2015-03-11 DIAGNOSIS — G43A1 Cyclical vomiting, intractable: Secondary | ICD-10-CM | POA: Insufficient documentation

## 2015-03-11 DIAGNOSIS — I10 Essential (primary) hypertension: Secondary | ICD-10-CM | POA: Insufficient documentation

## 2015-03-11 DIAGNOSIS — E876 Hypokalemia: Secondary | ICD-10-CM | POA: Diagnosis not present

## 2015-03-11 DIAGNOSIS — E119 Type 2 diabetes mellitus without complications: Secondary | ICD-10-CM | POA: Insufficient documentation

## 2015-03-11 DIAGNOSIS — R1115 Cyclical vomiting syndrome unrelated to migraine: Secondary | ICD-10-CM

## 2015-03-11 LAB — CBC WITH DIFFERENTIAL/PLATELET
BASOS ABS: 0 10*3/uL (ref 0–0.1)
BASOS PCT: 0 %
EOS ABS: 0.1 10*3/uL (ref 0–0.7)
EOS PCT: 1 %
HCT: 41.9 % (ref 40.0–52.0)
Hemoglobin: 14.5 g/dL (ref 13.0–18.0)
LYMPHS ABS: 2.6 10*3/uL (ref 1.0–3.6)
Lymphocytes Relative: 26 %
MCH: 29.4 pg (ref 26.0–34.0)
MCHC: 34.6 g/dL (ref 32.0–36.0)
MCV: 85.1 fL (ref 80.0–100.0)
Monocytes Absolute: 0.9 10*3/uL (ref 0.2–1.0)
Monocytes Relative: 9 %
Neutro Abs: 6.4 10*3/uL (ref 1.4–6.5)
Neutrophils Relative %: 64 %
PLATELETS: 336 10*3/uL (ref 150–440)
RBC: 4.93 MIL/uL (ref 4.40–5.90)
RDW: 13.6 % (ref 11.5–14.5)
WBC: 10.1 10*3/uL (ref 3.8–10.6)

## 2015-03-11 LAB — COMPREHENSIVE METABOLIC PANEL
ALBUMIN: 3.9 g/dL (ref 3.5–5.0)
ALT: 17 U/L (ref 17–63)
ANION GAP: 8 (ref 5–15)
AST: 17 U/L (ref 15–41)
Alkaline Phosphatase: 58 U/L (ref 38–126)
BUN: 12 mg/dL (ref 6–20)
CHLORIDE: 106 mmol/L (ref 101–111)
CO2: 22 mmol/L (ref 22–32)
CREATININE: 0.7 mg/dL (ref 0.61–1.24)
Calcium: 9.3 mg/dL (ref 8.9–10.3)
GFR calc non Af Amer: >60 mL/min (ref >60)
Glucose, Bld: 162 mg/dL — ABNORMAL HIGH (ref 65–99)
Potassium: 2.1 mmol/L — CL (ref 3.5–5.1)
SODIUM: 136 mmol/L (ref 135–145)
Total Bilirubin: 0.8 mg/dL (ref 0.3–1.2)
Total Protein: 7.6 g/dL (ref 6.5–8.1)

## 2015-03-11 LAB — LIPASE, BLOOD: Lipase: 22 U/L (ref 11–51)

## 2015-03-11 MED ORDER — HALOPERIDOL LACTATE 5 MG/ML IJ SOLN
2.0000 mg | Freq: Once | INTRAMUSCULAR | Status: AC
  Administered 2015-03-11: 2 mg via INTRAVENOUS
  Filled 2015-03-11: qty 1

## 2015-03-11 MED ORDER — HYDROMORPHONE HCL 1 MG/ML IJ SOLN
1.0000 mg | Freq: Once | INTRAMUSCULAR | Status: AC
  Administered 2015-03-11: 1 mg via INTRAVENOUS
  Filled 2015-03-11: qty 1

## 2015-03-11 MED ORDER — LORAZEPAM 2 MG/ML IJ SOLN
2.0000 mg | Freq: Once | INTRAMUSCULAR | Status: AC
  Administered 2015-03-11: 2 mg via INTRAVENOUS
  Filled 2015-03-11: qty 1

## 2015-03-11 MED ORDER — HALOPERIDOL LACTATE 5 MG/ML IJ SOLN
5.0000 mg | Freq: Once | INTRAMUSCULAR | Status: AC
  Administered 2015-03-11: 5 mg via INTRAVENOUS
  Filled 2015-03-11: qty 1

## 2015-03-11 MED ORDER — POTASSIUM CHLORIDE CRYS ER 20 MEQ PO TBCR
40.0000 meq | EXTENDED_RELEASE_TABLET | Freq: Once | ORAL | Status: AC
  Administered 2015-03-11: 40 meq via ORAL
  Filled 2015-03-11: qty 2

## 2015-03-11 MED ORDER — KCL IN DEXTROSE-NACL 40-5-0.45 MEQ/L-%-% IV SOLN
INTRAVENOUS | Status: DC
  Administered 2015-03-11: 14:00:00 via INTRAVENOUS
  Filled 2015-03-11: qty 1000

## 2015-03-11 MED ORDER — DIPHENHYDRAMINE HCL 50 MG/ML IJ SOLN
25.0000 mg | Freq: Once | INTRAMUSCULAR | Status: AC
  Administered 2015-03-11: 25 mg via INTRAVENOUS
  Filled 2015-03-11: qty 1

## 2015-03-11 NOTE — ED Notes (Signed)
Dr Mayford Knifewilliams aware of pt requesting morphine

## 2015-03-11 NOTE — ED Provider Notes (Addendum)
[facility] Emergency Department Provider Note     Time seen: ----------------------------------------- 10:43 AM on 03/11/2015 -----------------------------------------    I have reviewed the triage vital signs and the nursing notes.   HISTORY  Chief Complaint Abdominal Pain    HPI Steve Alvarado is a 59 y.o. male who presents ER with intractable upper abdominal pain and vomiting. Patient states she's recently been discharged several times for gastritis. Patient states she's been taking his medications prescribed without any improvement. Patient states he they tried to place him on Maalox but it didn't help. Patient presents stating morphine typically helps his symptoms.   Past Medical History  Diagnosis Date  . Hypertension   . Coronary artery disease   . Diabetes mellitus     Type II  . Hyperlipidemia     Patient Active Problem List   Diagnosis Date Noted  . Intractable abdominal pain 03/08/2015  . Hypertensive urgency 03/08/2015  . Intractable nausea and vomiting 03/07/2015  . HTN (hypertension) 02/14/2011  . CAD (coronary artery disease) 08/13/2010  . Stented coronary artery 08/13/2010  . Diabetes mellitus (HCC) 08/13/2010  . Hyperlipemia 08/13/2010    Past Surgical History  Procedure Laterality Date  . Hand surgery      left  . Cardiac catheterization  08/06/2010    Bare metal stent placed in RCA.    Allergies Review of patient's allergies indicates no known allergies.  Social History Social History  Substance Use Topics  . Smoking status: Never Smoker   . Smokeless tobacco: Never Used  . Alcohol Use: No    Review of Systems Constitutional: Negative for fever. Eyes: Negative for visual changes. ENT: Negative for sore throat. Cardiovascular: Negative for chest pain. Respiratory: Negative for shortness of breath. Gastrointestinal: Positive for abdominal pain and vomiting Genitourinary: Negative for  dysuria. Musculoskeletal: Negative for back pain. Skin: Negative for rash. Neurological: Negative for headaches, focal weakness or numbness.  10-point ROS otherwise negative.  ____________________________________________   PHYSICAL EXAM:  VITAL SIGNS: ED Triage Vitals  Enc Vitals Group     BP --      Pulse Rate 03/11/15 1036 86     Resp 03/11/15 1036 24     Temp 03/11/15 1036 98.6 F (37 C)     Temp Source 03/11/15 1036 Oral     SpO2 03/11/15 1036 100 %     Weight 03/11/15 1036 203 lb (92.08 kg)     Height 03/11/15 1036 6\' 2"  (1.88 m)     Head Cir --      Peak Flow --      Pain Score 03/11/15 1036 10     Pain Loc --      Pain Edu? --      Excl. in GC? --     Constitutional: Alert and oriented. Patient with histrionic behavior Eyes: Conjunctivae are normal. PERRL. Normal extraocular movements. ENT   Head: Normocephalic and atraumatic.   Nose: No congestion/rhinnorhea.   Mouth/Throat: Mucous membranes are moist.   Neck: No stridor. Cardiovascular: Normal rate, regular rhythm. Normal and symmetric distal pulses are present in all extremities. No murmurs, rubs, or gallops. Respiratory: Normal respiratory effort without tachypnea nor retractions. Breath sounds are clear and equal bilaterally. No wheezes/rales/rhonchi. Gastrointestinal: Soft and nontender. No distention. No abdominal bruits.  Musculoskeletal: Nontender with normal range of motion in all extremities. No joint effusions.  No lower extremity tenderness nor edema. Neurologic:  Normal speech and language. No gross focal neurologic deficits are appreciated.  Speech is normal. No gait instability. Skin:  Skin is warm, dry and intact. No rash noted. Psychiatric: Mood and affect are normal. Speech and behavior are normal. Patient exhibits appropriate insight and judgment. ___________________________________________  ED COURSE:  Pertinent labs & imaging results that were available during my care of the  patient were reviewed by me and considered in my medical decision making (see chart for details). Patient's acute distress, will check basic labs, give IV Ativan, Haldol, Benadryl. Patient likely with cyclic vomiting syndrome  EKG: Interpreted by me, normal sinus rhythm with a rate of 87 bpm, normal PR interval, normal QS with, normal QT interval. Normal axis. ____________________________________________    LABS (pertinent positives/negatives)  Labs Reviewed  COMPREHENSIVE METABOLIC PANEL - Abnormal; Notable for the following:    Potassium 2.1 (*)    Glucose, Bld 162 (*)    All other components within normal limits  CBC WITH DIFFERENTIAL/PLATELET  LIPASE, BLOOD   ____________________________________________  FINAL ASSESSMENT AND PLAN  Cyclic vomiting syndrome, hypokalemia  Plan: Patient with labs and imaging as dictated above. Patient with a potassium of 2.1, will start on IV fluid with potassium. He was also given oral potassium, would likely benefit from observation.   Emily Filbert, MD  Patient is requested transfer to [facility], has been accepted there by Dr. Hyacinth Meeker. He did have the capability treat, family has requested transfer to [facility]. Patient currently is feeling better. Emily Filbert, MD 03/11/15 1311  Emily Filbert, MD 03/11/15 1358  Emily Filbert, MD 03/11/15 [phone]

## 2015-03-11 NOTE — ED Notes (Signed)
Was discharged yesterday for gastriris , states pain is no better

## 2015-07-08 ENCOUNTER — Ambulatory Visit: Payer: Self-pay | Admitting: Cardiovascular Disease

## 2015-07-10 ENCOUNTER — Encounter: Payer: Self-pay | Admitting: *Deleted

## 2015-07-10 ENCOUNTER — Ambulatory Visit: Payer: Self-pay | Admitting: Cardiovascular Disease

## 2018-03-20 ENCOUNTER — Telehealth: Payer: Self-pay | Admitting: Cardiovascular Disease

## 2018-03-20 NOTE — Telephone Encounter (Signed)
Patients wife came by our office requesting that we please fill out DMV forms for patient. Reviewed that those papers would need to be completed by his primary care provider. After chart review patient has not been seen in 5 years here in our office so if we need to complete any paperwork he would require appointment.

## 2019-10-12 ENCOUNTER — Other Ambulatory Visit: Payer: Self-pay

## 2019-10-12 ENCOUNTER — Emergency Department
Admission: EM | Admit: 2019-10-12 | Discharge: 2019-10-12 | Disposition: A | Discharge status: Home / Self Care | Payer: Medicare Other | Attending: Emergency Medicine | Admitting: Emergency Medicine

## 2019-10-12 ENCOUNTER — Emergency Department: Payer: Medicare Other

## 2019-10-12 ENCOUNTER — Encounter: Payer: Self-pay | Admitting: Intensive Care

## 2019-10-12 DIAGNOSIS — Z5321 Procedure and treatment not carried out due to patient leaving prior to being seen by health care provider: Secondary | ICD-10-CM | POA: Diagnosis not present

## 2019-10-12 DIAGNOSIS — R531 Weakness: Secondary | ICD-10-CM | POA: Diagnosis not present

## 2019-10-12 DIAGNOSIS — M791 Myalgia, unspecified site: Secondary | ICD-10-CM | POA: Diagnosis not present

## 2019-10-12 DIAGNOSIS — R6883 Chills (without fever): Secondary | ICD-10-CM | POA: Insufficient documentation

## 2019-10-12 DIAGNOSIS — R05 Cough: Secondary | ICD-10-CM | POA: Diagnosis not present

## 2019-10-12 LAB — CBC WITH DIFFERENTIAL/PLATELET
Abs Immature Granulocytes: 0.02 10*3/uL (ref 0.00–0.07)
Basophils Absolute: 0 10*3/uL (ref 0.0–0.1)
Basophils Relative: 1 %
Eosinophils Absolute: 0.2 10*3/uL (ref 0.0–0.5)
Eosinophils Relative: 6 %
HCT: 36.1 % — ABNORMAL LOW (ref 39.0–52.0)
Hemoglobin: 11.9 g/dL — ABNORMAL LOW (ref 13.0–17.0)
Immature Granulocytes: 1 %
Lymphocytes Relative: 23 %
Lymphs Abs: 1 10*3/uL (ref 0.7–4.0)
MCH: 29 pg (ref 26.0–34.0)
MCHC: 33 g/dL (ref 30.0–36.0)
MCV: 87.8 fL (ref 80.0–100.0)
Monocytes Absolute: 0.8 10*3/uL (ref 0.1–1.0)
Monocytes Relative: 18 %
Neutro Abs: 2.2 10*3/uL (ref 1.7–7.7)
Neutrophils Relative %: 51 %
Platelets: 390 10*3/uL (ref 150–400)
RBC: 4.11 MIL/uL — ABNORMAL LOW (ref 4.22–5.81)
RDW: 12.8 % (ref 11.5–15.5)
WBC: 4.2 10*3/uL (ref 4.0–10.5)
nRBC: 0 % (ref 0.0–0.2)

## 2019-10-12 LAB — COMPREHENSIVE METABOLIC PANEL
ALT: 25 U/L (ref 0–44)
AST: 37 U/L (ref 15–41)
Albumin: 3.4 g/dL — ABNORMAL LOW (ref 3.5–5.0)
Alkaline Phosphatase: 88 U/L (ref 38–126)
Anion gap: 10 (ref 5–15)
BUN: 16 mg/dL (ref 8–23)
CO2: 23 mmol/L (ref 22–32)
Calcium: 8.9 mg/dL (ref 8.9–10.3)
Chloride: 105 mmol/L (ref 98–111)
Creatinine, Ser: 1.08 mg/dL (ref 0.61–1.24)
GFR calc Af Amer: >60 mL/min (ref >60)
GFR calc non Af Amer: >60 mL/min (ref >60)
Glucose, Bld: 134 mg/dL — ABNORMAL HIGH (ref 70–99)
Potassium: 4 mmol/L (ref 3.5–5.1)
Sodium: 138 mmol/L (ref 135–145)
Total Bilirubin: 0.8 mg/dL (ref 0.3–1.2)
Total Protein: 7.3 g/dL (ref 6.5–8.1)

## 2019-10-12 LAB — LACTIC ACID, PLASMA: Lactic Acid, Venous: 1.1 mmol/L (ref 0.5–1.9)

## 2019-10-12 IMAGING — CR DG CHEST 2V
1 series · 2 of 2 positions shown · non-contrast
Comparison: [DATE]

CLINICAL DATA: Weakness, cough, malaise

EXAM:
CHEST - 2 VIEW

[Series 1: dg chest 2 view · 0.14mm/px · 2 of 2 slices shown]
[im 1/2]
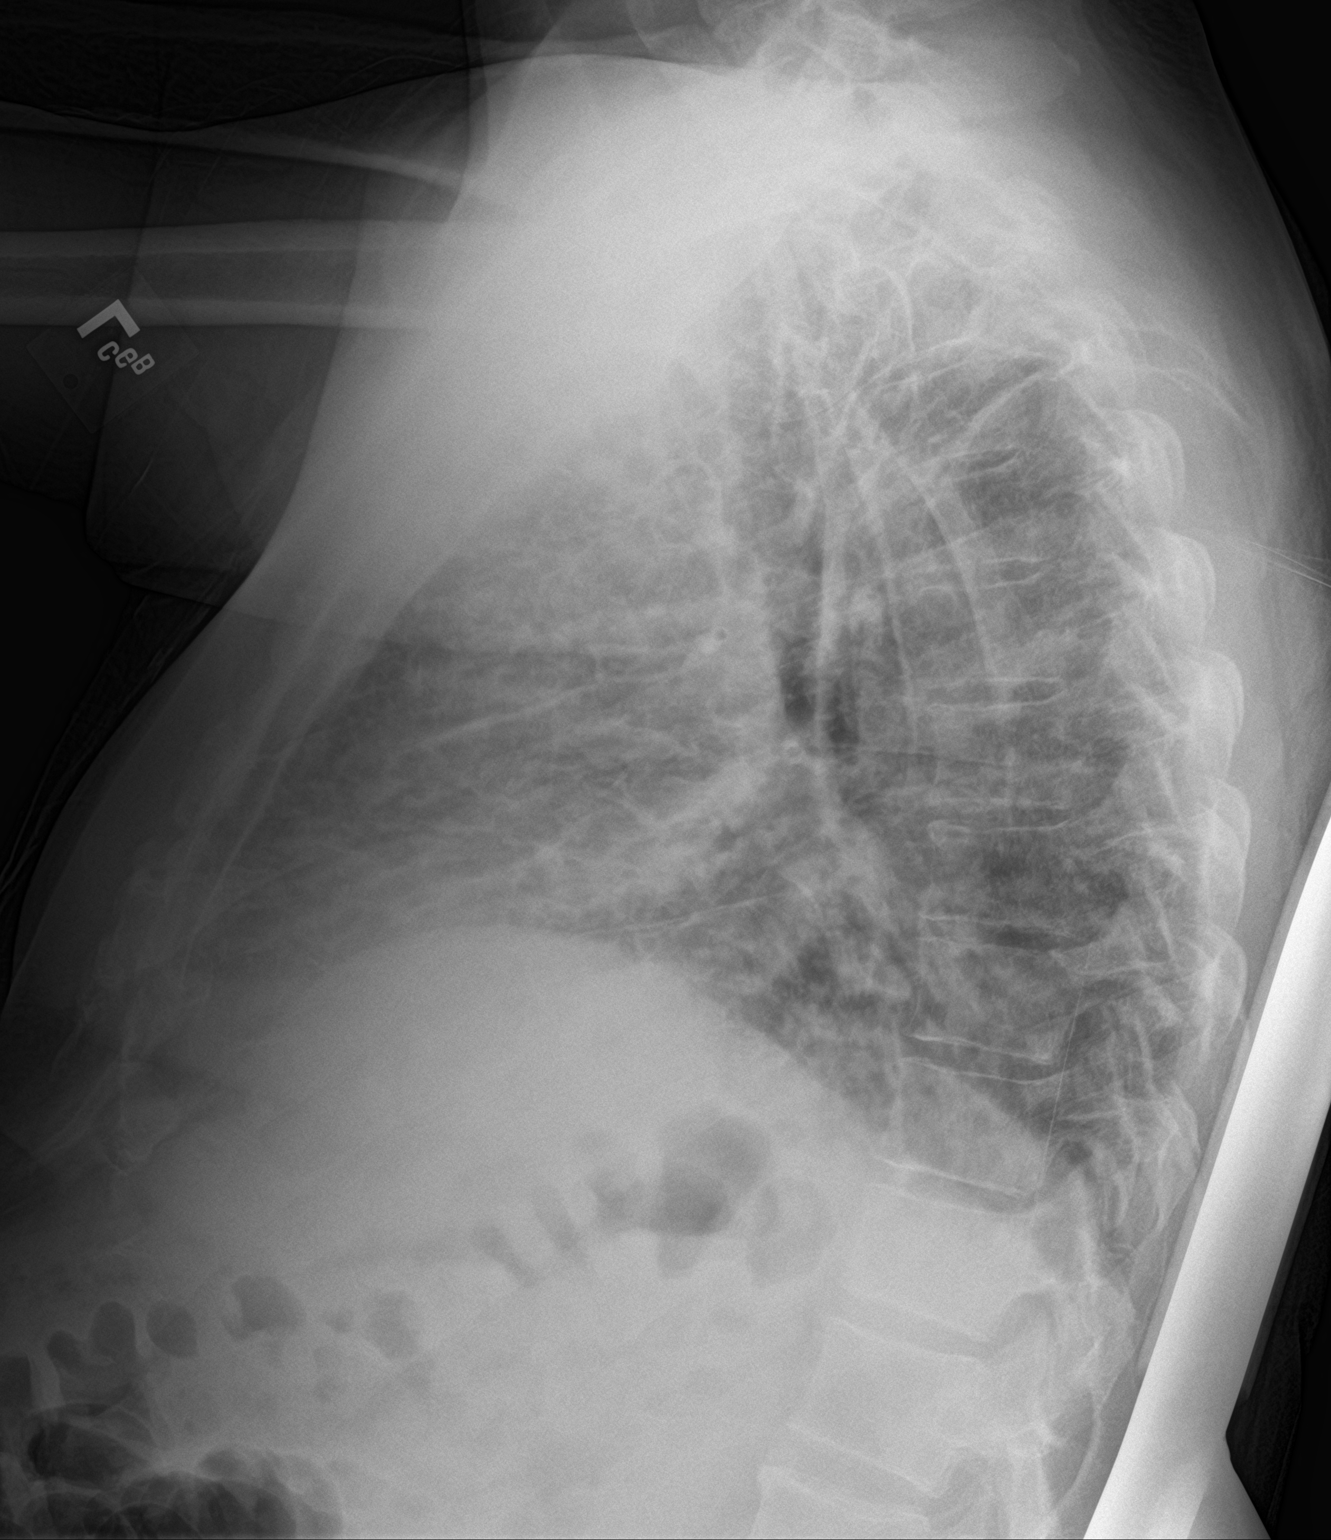
[im 2/2]
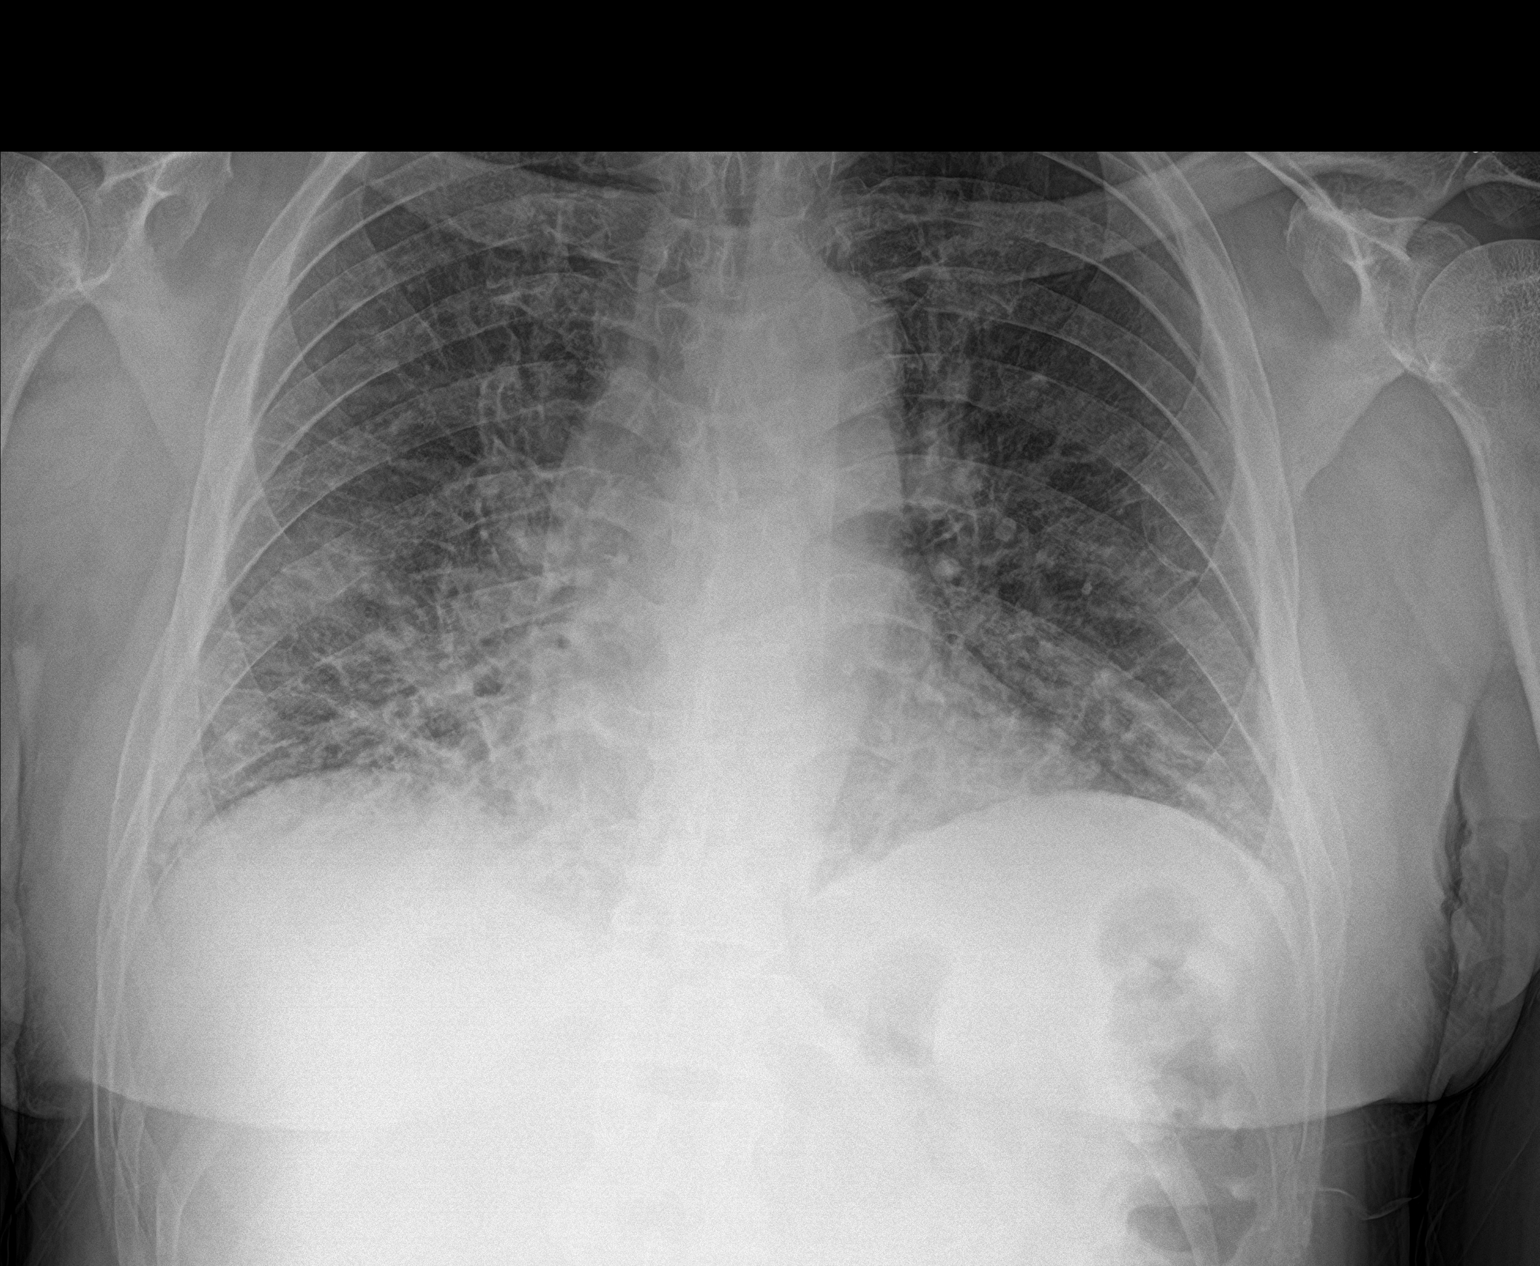

[2 of 2 positions shown; findings below may reference images not displayed]

FINDINGS: The lungs are symmetrically expanded. There has developed bibasilar
asymmetric interstitial pulmonary infiltrate, more severe within the
right middle and lower lung zones, a likely reflecting changes of
asymmetric pulmonary edema or atypical infection in the acute
setting. No pneumothorax or pleural effusion. Cardiac size is within
normal limits. No acute bone abnormality.
IMPRESSION: Bilateral asymmetric interstitial pulmonary infiltrates, edema
versus infection.

## 2019-10-12 MED ORDER — ACETAMINOPHEN 325 MG PO TABS
650.0000 mg | ORAL_TABLET | Freq: Once | ORAL | Status: AC | PRN
  Administered 2019-10-12: 650 mg via ORAL
  Filled 2019-10-12: qty 2

## 2019-10-12 NOTE — ED Notes (Signed)
Pt called for vital sign recheck, no answer

## 2019-10-12 NOTE — ED Notes (Signed)
Accidentally clicked off lactic to be redrawn at 1700. Still needs to be done at that time.

## 2019-10-12 NOTE — ED Notes (Signed)
Pt called for vital sign recheck, no answer. Pt not visualized in the lobby

## 2019-10-12 NOTE — ED Triage Notes (Signed)
Patient c/o weakness, cough, chills, and body aches that started today. Fever present in triage of 102.9oral.

## 2019-10-12 NOTE — ED Notes (Signed)
Pt called for updated vital signs. No answer.

## 2020-08-05 ENCOUNTER — Emergency Department
Admission: EM | Admit: 2020-08-05 | Discharge: 2020-08-05 | Disposition: A | Discharge status: Home / Self Care | Payer: Medicare Other | Attending: Emergency Medicine | Admitting: Emergency Medicine

## 2020-08-05 ENCOUNTER — Other Ambulatory Visit: Payer: Self-pay

## 2020-08-05 ENCOUNTER — Emergency Department: Payer: Medicare Other

## 2020-08-05 DIAGNOSIS — S20212A Contusion of left front wall of thorax, initial encounter: Secondary | ICD-10-CM

## 2020-08-05 DIAGNOSIS — I2510 Atherosclerotic heart disease of native coronary artery without angina pectoris: Secondary | ICD-10-CM | POA: Insufficient documentation

## 2020-08-05 DIAGNOSIS — I10 Essential (primary) hypertension: Secondary | ICD-10-CM | POA: Diagnosis not present

## 2020-08-05 DIAGNOSIS — Z7984 Long term (current) use of oral hypoglycemic drugs: Secondary | ICD-10-CM | POA: Diagnosis not present

## 2020-08-05 DIAGNOSIS — W010XXA Fall on same level from slipping, tripping and stumbling without subsequent striking against object, initial encounter: Secondary | ICD-10-CM | POA: Diagnosis not present

## 2020-08-05 DIAGNOSIS — Z79899 Other long term (current) drug therapy: Secondary | ICD-10-CM | POA: Diagnosis not present

## 2020-08-05 DIAGNOSIS — E119 Type 2 diabetes mellitus without complications: Secondary | ICD-10-CM | POA: Diagnosis not present

## 2020-08-05 DIAGNOSIS — S299XXA Unspecified injury of thorax, initial encounter: Secondary | ICD-10-CM | POA: Diagnosis present

## 2020-08-05 HISTORY — DX: Disorder of thyroid, unspecified: E07.9

## 2020-08-05 IMAGING — CR DG RIBS W/ CHEST 3+V*L*
3 series · 3 of 3 positions shown · non-contrast
Comparison: Chest radiograph [DATE].

CLINICAL DATA: Fall, left lateral rib pain, initial encounter.

EXAM:
LEFT RIBS AND CHEST - 3+ VIEW

[chest pa]
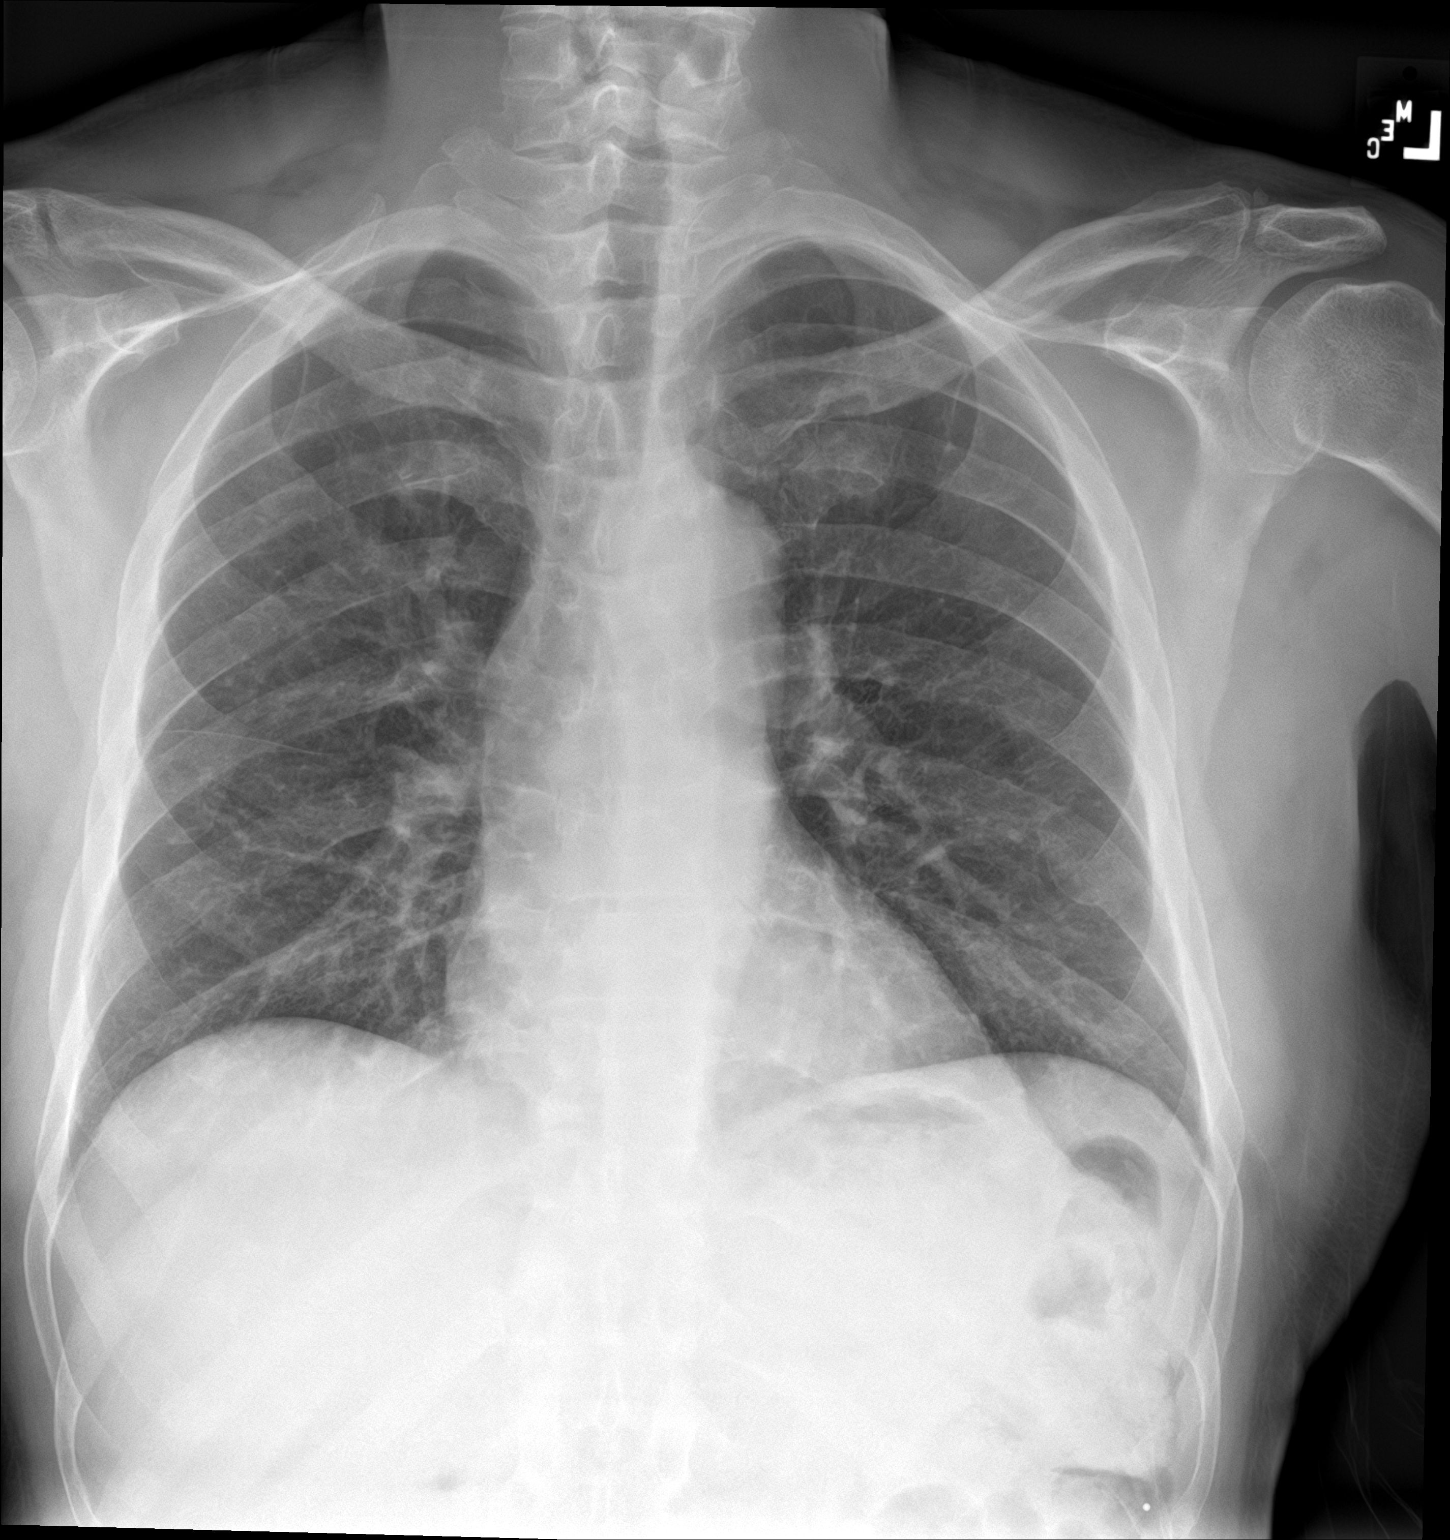

[rib pa]
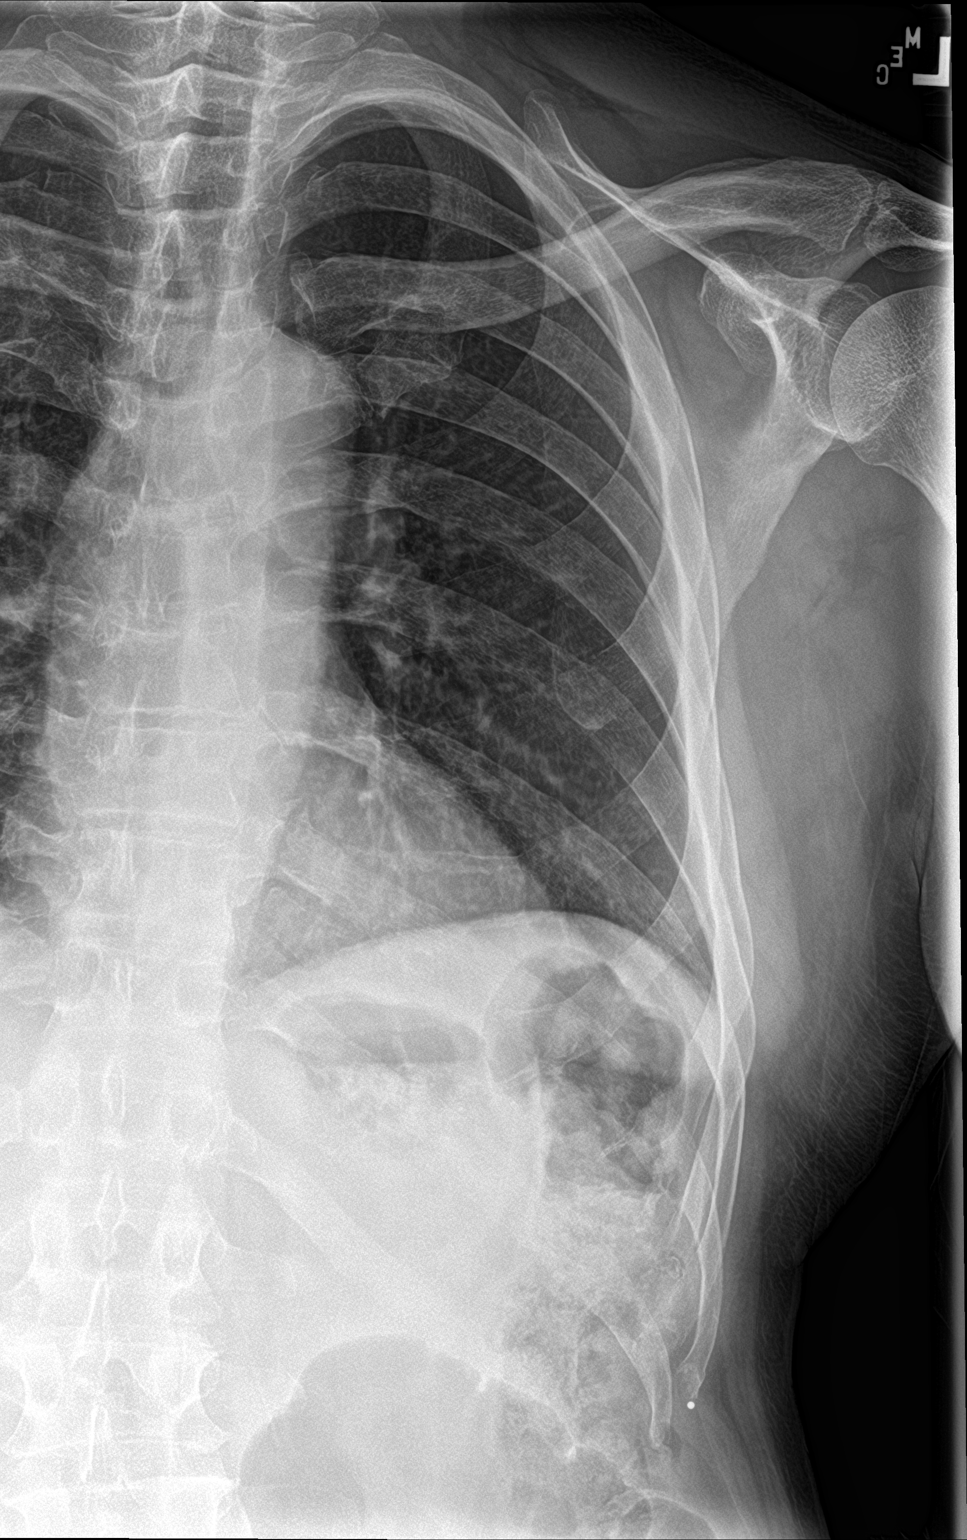

[rib pa obl]
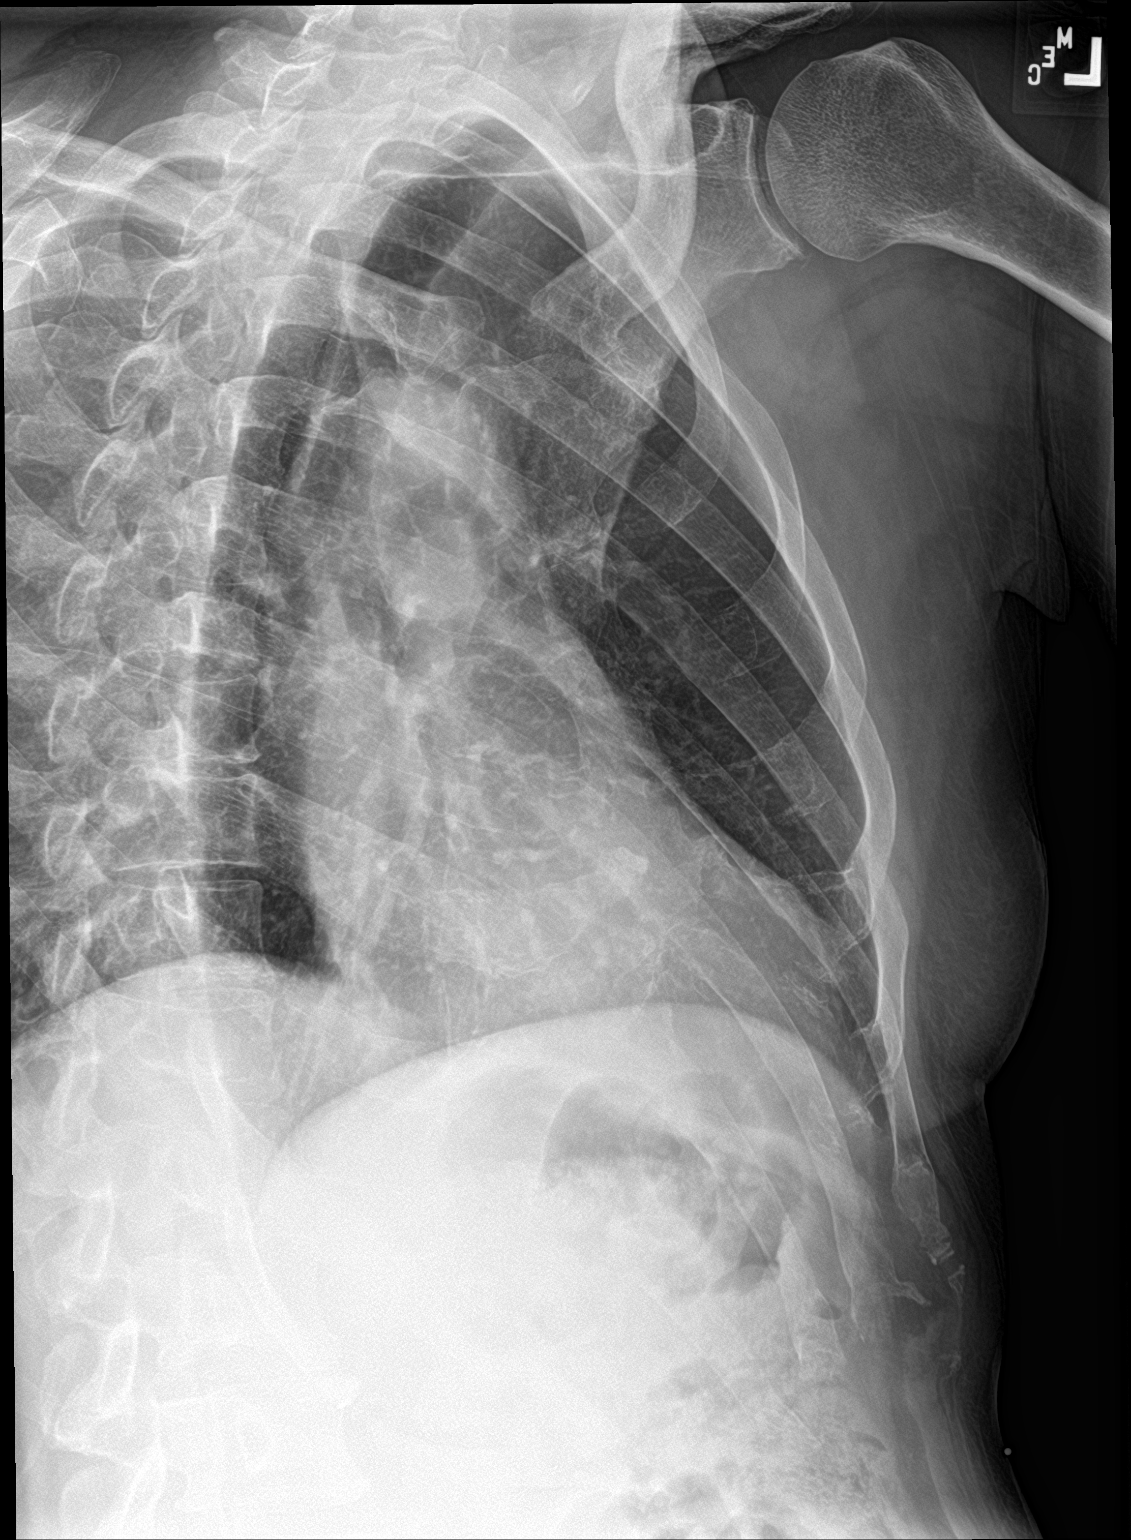

[3 of 3 positions shown; findings below may reference images not displayed]

FINDINGS: Trachea is midline. Heart size stable. Lungs are clear. No pleural
fluid.

Dedicated views of the left ribs show no definite acute fracture.
IMPRESSION: No acute findings.

## 2020-08-05 MED ORDER — LIDOCAINE 5 % EX PTCH: 1.0000 | MEDICATED_PATCH | Freq: Two times a day (BID) | CUTANEOUS | 0 refills | Status: DC

## 2020-08-05 MED ORDER — TRAMADOL HCL 50 MG PO TABS: 50.0000 mg | ORAL_TABLET | Freq: Two times a day (BID) | ORAL | 0 refills | Status: DC | PRN

## 2020-08-05 NOTE — ED Provider Notes (Signed)
[facility] Emergency Department Provider Note   ____________________________________________   Event Date/Time   First MD Initiated Contact with Patient 08/05/20 [phone]     (approximate)  I have reviewed the triage vital signs and the nursing notes.   HISTORY  Chief Complaint Rib Injury    HPI Steve Alvarado is a 65 y.o. male patient complain of left rib pain secondary to fall 2 to 3 weeks ago.  Patient said he tripped and fell.  There was no LOC.  Patient denies shortness of breath.  Patient pain increased with deep inspiration and laying on the left side.  Patient did not seek medical care on date of injury.  Rates pain as 8/10.  Described pain as "achy".  Mild relief with anti-inflammatory medications.         Past Medical History:  Diagnosis Date  . Coronary artery disease   . Diabetes mellitus    Type II  . Hyperlipidemia   . Hypertension   . Thyroid disease     Patient Active Problem List   Diagnosis Date Noted  . Intractable abdominal pain 03/08/2015  . Hypertensive urgency 03/08/2015  . Intractable nausea and vomiting 03/07/2015  . HTN (hypertension) 02/14/2011  . CAD (coronary artery disease) 08/13/2010  . Stented coronary artery 08/13/2010  . Diabetes mellitus (HCC) 08/13/2010  . Hyperlipemia 08/13/2010    Past Surgical History:  Procedure Laterality Date  . CARDIAC CATHETERIZATION  08/06/2010   Bare metal stent placed in RCA.  Marland Kitchen HAND SURGERY     left    Prior to Admission medications   Medication Sig Start Date End Date Taking? Authorizing Provider  lidocaine (LIDODERM) 5 % Place 1 patch onto the skin every 12 (twelve) hours. Remove & Discard patch within 12 hours or as directed by MD 08/05/20 08/05/21 Yes Joni Reining, PA-C  traMADol (ULTRAM) 50 MG tablet Take 1 tablet (50 mg total) by mouth every 12 (twelve) hours as needed. 08/05/20  Yes Joni Reining, PA-C  amLODipine (NORVASC) 10 MG tablet Take 1 tablet (10 mg  total) by mouth daily. 10/30/14 01/27/16  Darci Current, MD  atorvastatin (LIPITOR) 40 MG tablet Take 40 mg by mouth every evening.    [provider]  cloNIDine (CATAPRES) 0.2 MG tablet Take 1 tablet (0.2 mg total) by mouth 2 (two) times daily. 10/30/14   Darci Current, MD  insulin glargine (LANTUS) 100 unit/mL SOPN Inject 20 Units into the skin 2 (two) times daily.    [provider]  metFORMIN (GLUCOPHAGE) 1000 MG tablet Take 1 tablet (1,000 mg total) by mouth 2 (two) times daily. Patient taking differently: Take 1,000 mg by mouth daily.  10/20/11   Antonieta Iba, MD  pantoprazole (PROTONIX) 40 MG tablet Take 1 tablet (40 mg total) by mouth 2 (two) times daily. 03/10/15   Enedina Finner, MD    Allergies Patient has no known allergies.  Family History  Problem Relation Age of Onset  . Heart attack Mother     Social History Social History   Tobacco Use  . Smoking status: Never Smoker  . Smokeless tobacco: Never Used  Substance Use Topics  . Alcohol use: No  . Drug use: Not Currently    Review of Systems Constitutional: No fever/chills Eyes: No visual changes. ENT: No sore throat. Cardiovascular: Denies chest pain. Respiratory: Denies shortness of breath. Gastrointestinal: No abdominal pain.  No nausea, no vomiting.  No diarrhea.  No constipation. Genitourinary:  Negative for dysuria. Musculoskeletal: Negative for back pain. Skin: Negative for rash. Neurological: Negative for headaches, focal weakness or numbness. Endocrine:  Diabetes, hyperlipidemia, hypertension, hypothyroidism. ____________________________________________   PHYSICAL EXAM:  VITAL SIGNS: ED Triage Vitals  Enc Vitals Group     BP 08/05/20 0925 (!) 163/86     Pulse Rate 08/05/20 0925 77     Resp 08/05/20 0925 16     Temp 08/05/20 0925 98.1 F (36.7 C)     Temp Source 08/05/20 0925 Oral     SpO2 08/05/20 0925 100 %     Weight 08/05/20 0926 185 lb (83.9 kg)     Height 08/05/20  0926 6\' 2"  (1.88 m)     Head Circumference --      Peak Flow --      Pain Score 08/05/20 0926 8     Pain Loc --      Pain Edu? --      Excl. in GC? --     Constitutional: Alert and oriented. Well appearing and in no acute distress. Neck: No cervical spine tenderness to palpation. Hematological/Lymphatic/Immunilogical: No cervical lymphadenopathy. Cardiovascular: Normal rate, regular rhythm. Grossly normal heart sounds.  Good peripheral circulation.  Elevated blood pressure Respiratory: Normal respiratory effort.  No retractions. Lungs CTAB. Gastrointestinal: Soft and nontender. No distention. No abdominal bruits. No CVA tenderness. Musculoskeletal: No lower extremity tenderness nor edema.  No joint effusions. Neurologic:  Normal speech and language. No gross focal neurologic deficits are appreciated. No gait instability. Skin:  Skin is warm, dry and intact. No rash noted.  No abrasion or ecchymosis. Psychiatric: Mood and affect are normal. Speech and behavior are normal.  ____________________________________________   LABS (all labs ordered are listed, but only abnormal results are displayed)  Labs Reviewed - No data to display ____________________________________________  EKG   ____________________________________________  RADIOLOGY I, 08/07/20, personally viewed and evaluated these images (plain radiographs) as part of my medical decision making, as well as reviewing the written report by the radiologist.  ED MD interpretation:    Official radiology report(s): DG Ribs Unilateral W/Chest Left  Result Date: 08/05/2020 CLINICAL DATA:  Fall, left lateral rib pain, initial encounter. EXAM: LEFT RIBS AND CHEST - 3+ VIEW COMPARISON:  Chest radiograph 10/12/2019. FINDINGS: Trachea is midline. Heart size stable. Lungs are clear. No pleural fluid. Dedicated views of the left ribs show no definite acute fracture. IMPRESSION: No acute findings. Electronically Signed   By: 10/14/2019 M.D.   On: 08/05/2020 10:37    ____________________________________________   PROCEDURES  Procedure(s) performed (including Critical Care):  Procedures   ____________________________________________   INITIAL IMPRESSION / ASSESSMENT AND PLAN / ED COURSE  As part of my medical decision making, I reviewed the following data within the electronic MEDICAL RECORD NUMBER         Patient presents for left lateral rib pain secondary to fall 2 weeks ago.  No acute findings on chest x-ray.  Patient plain physical exam consistent with rib contusion.  Patient given discharge care instruction advised take medication as directed.  Follow-up with PCP.      ____________________________________________   FINAL CLINICAL IMPRESSION(S) / ED DIAGNOSES  Final diagnoses:  Contusion of rib, left, initial encounter     ED Discharge Orders         Ordered    lidocaine (LIDODERM) 5 %  Every 12 hours        08/05/20 1047    traMADol (ULTRAM) 50 MG tablet  Every 12 hours PRN        08/05/20 1047          *Please note:  JAMESMICHAEL SHADD was evaluated in Emergency Department on 08/05/2020 for the symptoms described in the history of present illness. He was evaluated in the context of the global COVID-19 pandemic, which necessitated consideration that the patient might be at risk for infection with the SARS-CoV-2 virus that causes COVID-19. Institutional protocols and algorithms that pertain to the evaluation of patients at risk for COVID-19 are in a state of rapid change based on information released by regulatory bodies including the CDC and federal and state organizations. These policies and algorithms were followed during the patient's care in the ED.  Some ED evaluations and interventions may be delayed as a result of limited staffing during and the pandemic.*   Note:  This document was prepared using Dragon voice recognition software and may include unintentional dictation errors.     Joni Reining, PA-C 08/05/20 1049    Dionne Bucy, MD 08/05/20 1119

## 2020-08-05 NOTE — ED Notes (Signed)
Provided DC instructions. Verbalized understanding.  

## 2020-08-05 NOTE — ED Triage Notes (Signed)
Pt states he fell about 2-3 weeks ago and since has been having left lateral rib pain.

## 2020-08-05 NOTE — Discharge Instructions (Addendum)
No fracture on x-ray of the left ribs.  Read and follow discharge care instructions.  Take medication as directed.

## 2020-08-06 ENCOUNTER — Observation Stay: Payer: Medicare Other

## 2020-08-06 ENCOUNTER — Other Ambulatory Visit: Payer: Self-pay

## 2020-08-06 ENCOUNTER — Emergency Department: Payer: Medicare Other

## 2020-08-06 ENCOUNTER — Observation Stay
Admission: EM | Admit: 2020-08-06 | Discharge: 2020-08-10 | Disposition: A | Discharge status: Home / Self Care | Payer: Medicare Other | Attending: Internal Medicine | Admitting: Internal Medicine

## 2020-08-06 DIAGNOSIS — Z79899 Other long term (current) drug therapy: Secondary | ICD-10-CM | POA: Diagnosis not present

## 2020-08-06 DIAGNOSIS — I2510 Atherosclerotic heart disease of native coronary artery without angina pectoris: Secondary | ICD-10-CM | POA: Insufficient documentation

## 2020-08-06 DIAGNOSIS — U071 COVID-19: Secondary | ICD-10-CM | POA: Insufficient documentation

## 2020-08-06 DIAGNOSIS — R55 Syncope and collapse: Principal | ICD-10-CM | POA: Insufficient documentation

## 2020-08-06 DIAGNOSIS — I160 Hypertensive urgency: Secondary | ICD-10-CM | POA: Diagnosis not present

## 2020-08-06 DIAGNOSIS — E1142 Type 2 diabetes mellitus with diabetic polyneuropathy: Secondary | ICD-10-CM | POA: Diagnosis not present

## 2020-08-06 DIAGNOSIS — R778 Other specified abnormalities of plasma proteins: Secondary | ICD-10-CM | POA: Diagnosis not present

## 2020-08-06 DIAGNOSIS — F1410 Cocaine abuse, uncomplicated: Secondary | ICD-10-CM

## 2020-08-06 DIAGNOSIS — W19XXXA Unspecified fall, initial encounter: Secondary | ICD-10-CM | POA: Insufficient documentation

## 2020-08-06 DIAGNOSIS — Y900 Blood alcohol level of less than 20 mg/100 ml: Secondary | ICD-10-CM | POA: Diagnosis not present

## 2020-08-06 DIAGNOSIS — Z794 Long term (current) use of insulin: Secondary | ICD-10-CM | POA: Insufficient documentation

## 2020-08-06 DIAGNOSIS — E1165 Type 2 diabetes mellitus with hyperglycemia: Secondary | ICD-10-CM

## 2020-08-06 DIAGNOSIS — E1169 Type 2 diabetes mellitus with other specified complication: Secondary | ICD-10-CM

## 2020-08-06 DIAGNOSIS — I10 Essential (primary) hypertension: Secondary | ICD-10-CM | POA: Insufficient documentation

## 2020-08-06 DIAGNOSIS — Z7984 Long term (current) use of oral hypoglycemic drugs: Secondary | ICD-10-CM | POA: Insufficient documentation

## 2020-08-06 DIAGNOSIS — R404 Transient alteration of awareness: Secondary | ICD-10-CM

## 2020-08-06 LAB — CBC WITH DIFFERENTIAL/PLATELET
Abs Immature Granulocytes: 0.03 10*3/uL (ref 0.00–0.07)
Basophils Absolute: 0 10*3/uL (ref 0.0–0.1)
Basophils Relative: 0 %
Eosinophils Absolute: 0.1 10*3/uL (ref 0.0–0.5)
Eosinophils Relative: 1 %
HCT: 40 % (ref 39.0–52.0)
Hemoglobin: 14.4 g/dL (ref 13.0–17.0)
Immature Granulocytes: 0 %
Lymphocytes Relative: 18 %
Lymphs Abs: 1.3 10*3/uL (ref 0.7–4.0)
MCH: 30.2 pg (ref 26.0–34.0)
MCHC: 36 g/dL (ref 30.0–36.0)
MCV: 83.9 fL (ref 80.0–100.0)
Monocytes Absolute: 0.8 10*3/uL (ref 0.1–1.0)
Monocytes Relative: 11 %
Neutro Abs: 4.9 10*3/uL (ref 1.7–7.7)
Neutrophils Relative %: 70 %
Platelets: 219 10*3/uL (ref 150–400)
RBC: 4.77 MIL/uL (ref 4.22–5.81)
RDW: 11.9 % (ref 11.5–15.5)
WBC: 7.1 10*3/uL (ref 4.0–10.5)
nRBC: 0 % (ref 0.0–0.2)

## 2020-08-06 LAB — RESP PANEL BY RT-PCR (FLU A&B, COVID) ARPGX2
Influenza A by PCR: NEGATIVE
Influenza B by PCR: NEGATIVE
SARS Coronavirus 2 by RT PCR: POSITIVE — AB

## 2020-08-06 LAB — BASIC METABOLIC PANEL
Anion gap: 10 (ref 5–15)
BUN: 20 mg/dL (ref 8–23)
CO2: 22 mmol/L (ref 22–32)
Calcium: 8.9 mg/dL (ref 8.9–10.3)
Chloride: 99 mmol/L (ref 98–111)
Creatinine, Ser: 1.06 mg/dL (ref 0.61–1.24)
GFR, Estimated: >60 mL/min (ref >60)
Glucose, Bld: 373 mg/dL — ABNORMAL HIGH (ref 70–99)
Potassium: 4.3 mmol/L (ref 3.5–5.1)
Sodium: 131 mmol/L — ABNORMAL LOW (ref 135–145)

## 2020-08-06 LAB — TROPONIN I (HIGH SENSITIVITY)
Troponin I (High Sensitivity): 22 ng/L — ABNORMAL HIGH (ref <18)
Troponin I (High Sensitivity): 24 ng/L — ABNORMAL HIGH (ref <18)
Troponin I (High Sensitivity): 29 ng/L — ABNORMAL HIGH (ref <18)

## 2020-08-06 LAB — URINE DRUG SCREEN, QUALITATIVE (ARMC ONLY)
Amphetamines, Ur Screen: NOT DETECTED
Barbiturates, Ur Screen: NOT DETECTED
Benzodiazepine, Ur Scrn: NOT DETECTED
Cannabinoid 50 Ng, Ur ~~LOC~~: NOT DETECTED
Cocaine Metabolite,Ur ~~LOC~~: POSITIVE — AB
MDMA (Ecstasy)Ur Screen: NOT DETECTED
Methadone Scn, Ur: NOT DETECTED
Opiate, Ur Screen: NOT DETECTED
Phencyclidine (PCP) Ur S: NOT DETECTED
Tricyclic, Ur Screen: NOT DETECTED

## 2020-08-06 LAB — COMPREHENSIVE METABOLIC PANEL
ALT: 22 U/L (ref 0–44)
AST: 27 U/L (ref 15–41)
Albumin: 3.8 g/dL (ref 3.5–5.0)
Alkaline Phosphatase: 73 U/L (ref 38–126)
Anion gap: 11 (ref 5–15)
BUN: 21 mg/dL (ref 8–23)
CO2: 21 mmol/L — ABNORMAL LOW (ref 22–32)
Calcium: 9.2 mg/dL (ref 8.9–10.3)
Chloride: 97 mmol/L — ABNORMAL LOW (ref 98–111)
Creatinine, Ser: 1.16 mg/dL (ref 0.61–1.24)
GFR, Estimated: >60 mL/min (ref >60)
Glucose, Bld: 407 mg/dL — ABNORMAL HIGH (ref 70–99)
Potassium: 4.5 mmol/L (ref 3.5–5.1)
Sodium: 129 mmol/L — ABNORMAL LOW (ref 135–145)
Total Bilirubin: 1.5 mg/dL — ABNORMAL HIGH (ref 0.3–1.2)
Total Protein: 7.7 g/dL (ref 6.5–8.1)

## 2020-08-06 LAB — URINALYSIS, COMPLETE (UACMP) WITH MICROSCOPIC
Bacteria, UA: NONE SEEN
Bilirubin Urine: NEGATIVE
Glucose, UA: >=500 mg/dL — AB
Ketones, ur: 5 mg/dL — AB
Leukocytes,Ua: NEGATIVE
Nitrite: NEGATIVE
Protein, ur: 30 mg/dL — AB
Specific Gravity, Urine: 1.025 (ref 1.005–1.030)
Squamous Epithelial / HPF: NONE SEEN (ref 0–5)
pH: 5 (ref 5.0–8.0)

## 2020-08-06 LAB — CBC
HCT: 40.1 % (ref 39.0–52.0)
Hemoglobin: 14.3 g/dL (ref 13.0–17.0)
MCH: 30.2 pg (ref 26.0–34.0)
MCHC: 35.7 g/dL (ref 30.0–36.0)
MCV: 84.8 fL (ref 80.0–100.0)
Platelets: 227 10*3/uL (ref 150–400)
RBC: 4.73 MIL/uL (ref 4.22–5.81)
RDW: 11.9 % (ref 11.5–15.5)
WBC: 7.2 10*3/uL (ref 4.0–10.5)
nRBC: 0 % (ref 0.0–0.2)

## 2020-08-06 LAB — CBG MONITORING, ED
Glucose-Capillary: 382 mg/dL — ABNORMAL HIGH (ref 70–99)
Glucose-Capillary: 325 mg/dL — ABNORMAL HIGH (ref 70–99)
Glucose-Capillary: 317 mg/dL — ABNORMAL HIGH (ref 70–99)
Glucose-Capillary: 106 mg/dL — ABNORMAL HIGH (ref 70–99)

## 2020-08-06 LAB — ETHANOL: Alcohol, Ethyl (B): <10 mg/dL (ref <10)

## 2020-08-06 LAB — HEMOGLOBIN A1C
Hgb A1c MFr Bld: 13.9 % — ABNORMAL HIGH (ref 4.8–5.6)
Mean Plasma Glucose: 352.23 mg/dL

## 2020-08-06 LAB — HIV ANTIBODY (ROUTINE TESTING W REFLEX): HIV Screen 4th Generation wRfx: NONREACTIVE

## 2020-08-06 LAB — TSH: TSH: 6.004 u[IU]/mL — ABNORMAL HIGH (ref 0.350–4.500)

## 2020-08-06 LAB — GLUCOSE, CAPILLARY: Glucose-Capillary: 310 mg/dL — ABNORMAL HIGH (ref 70–99)

## 2020-08-06 LAB — LIPASE, BLOOD: Lipase: 33 U/L (ref 11–51)

## 2020-08-06 IMAGING — CR DG RIBS W/ CHEST 3+V*L*
1 series · 5 of 5 positions shown · non-contrast
Comparison: [DATE].

CLINICAL DATA: more pain after fall tonight in same area as
previous pain

EXAM:
LEFT RIBS AND CHEST - 3+ VIEW

[Series 1: dg ribs unilateral w/chest left · 0.14mm/px · 5 of 5 slices shown]
[im 1/5]
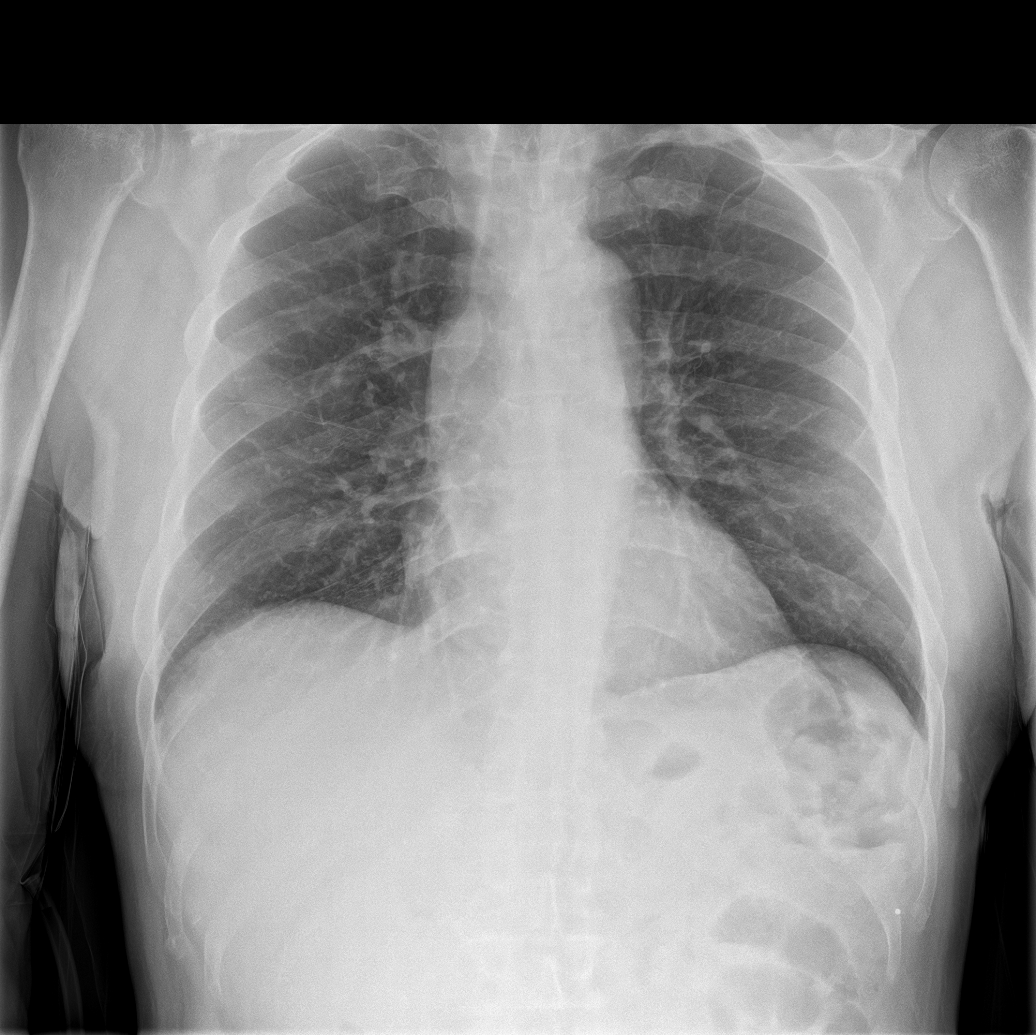
[im 2/5]
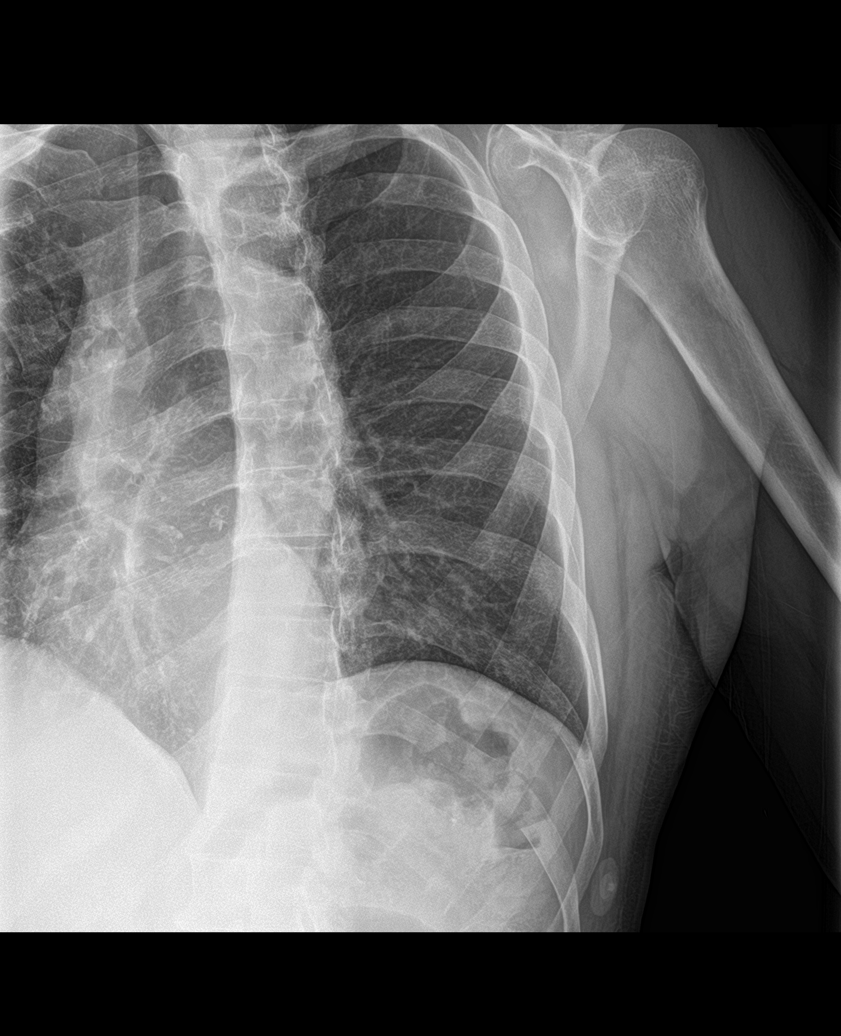
[im 3/5]
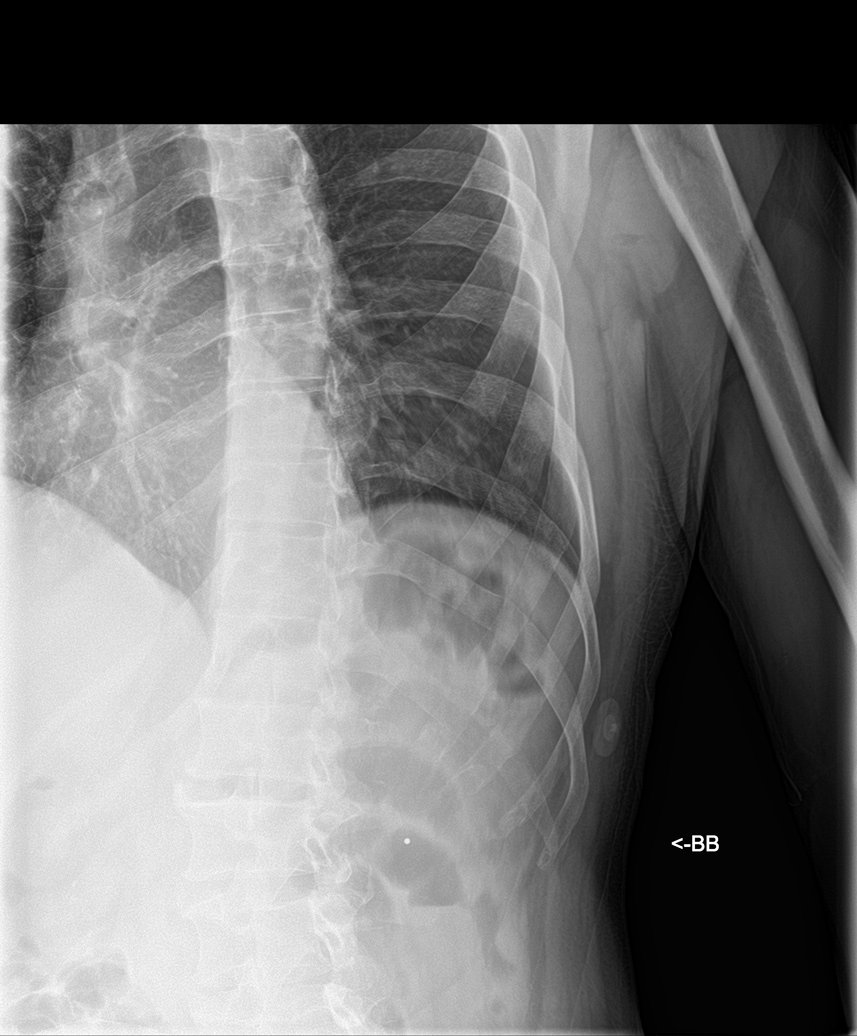
[im 4/5]
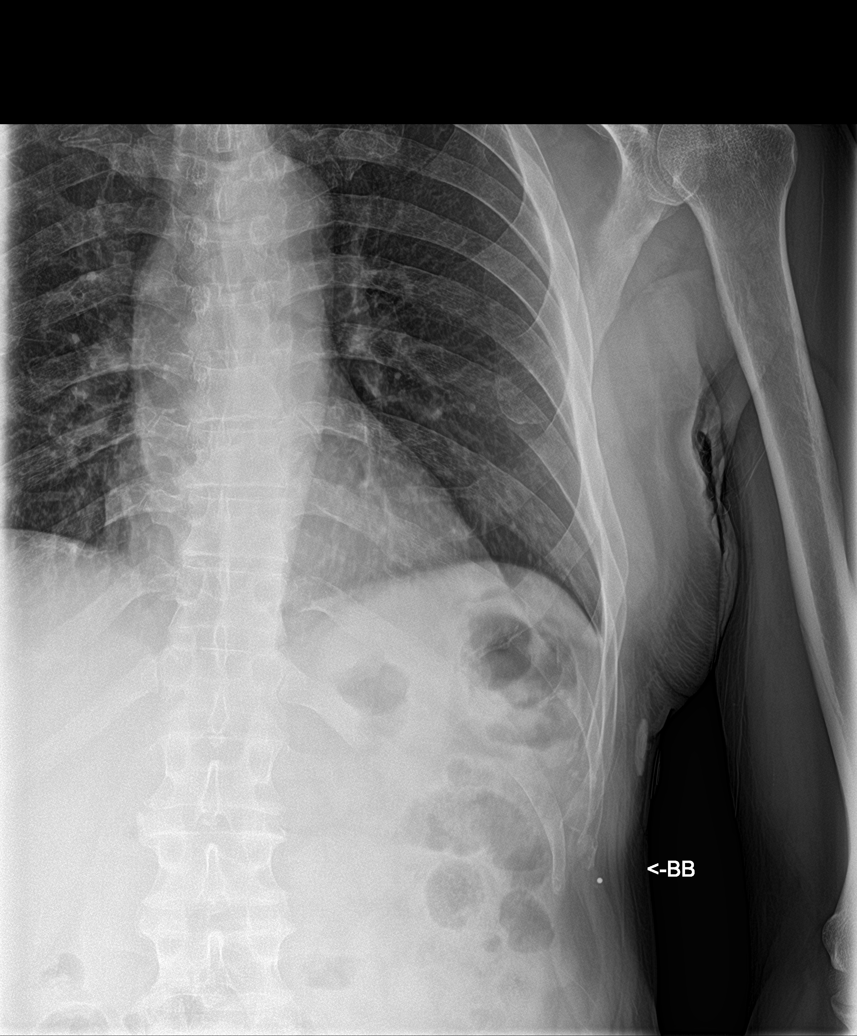
[im 5/5]
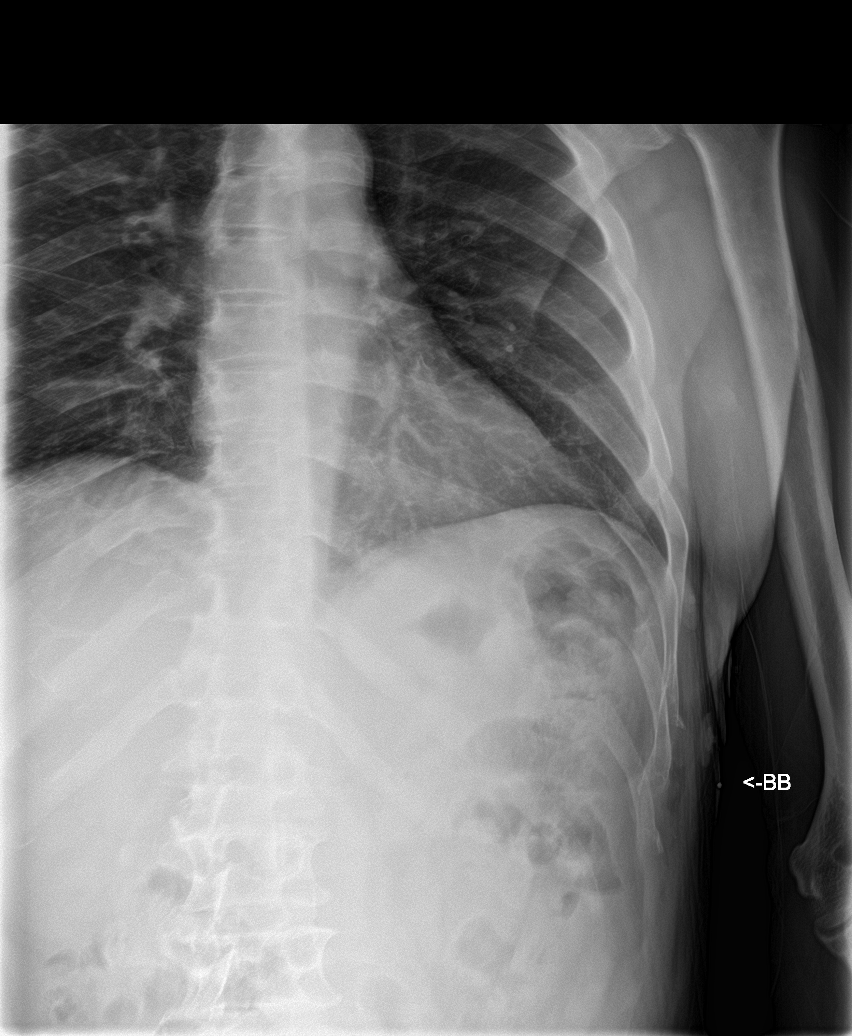

[5 of 5 positions shown; findings below may reference images not displayed]

FINDINGS: No displaced fracture or other bone lesions are seen involving the
ribs. There is no evidence of pneumothorax or pleural effusion. Both
lungs are clear. Heart size and mediastinal contours are within
normal limits.
IMPRESSION: No acute findings.

## 2020-08-06 IMAGING — US US CAROTID DUPLEX BILAT
1 series · 13 of 24 positions shown · non-contrast
Comparison: None.

CLINICAL DATA: Syncope

EXAM:
BILATERAL CAROTID DUPLEX ULTRASOUND
TECHNIQUE: Gray scale imaging, color Doppler and duplex ultrasound were
performed of bilateral carotid and vertebral arteries in the neck.

[Series 1: us carotid bilateral · 13 of 112 slices shown]
[im 1/112]
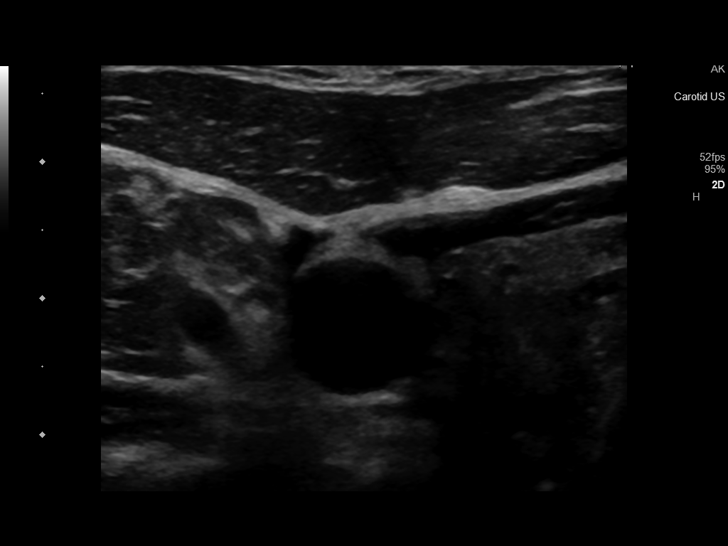
[im 10/112]
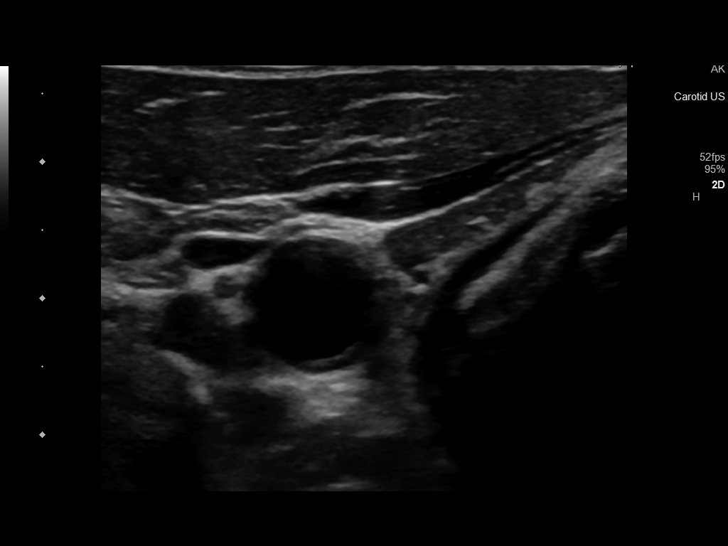
[im 20/112]
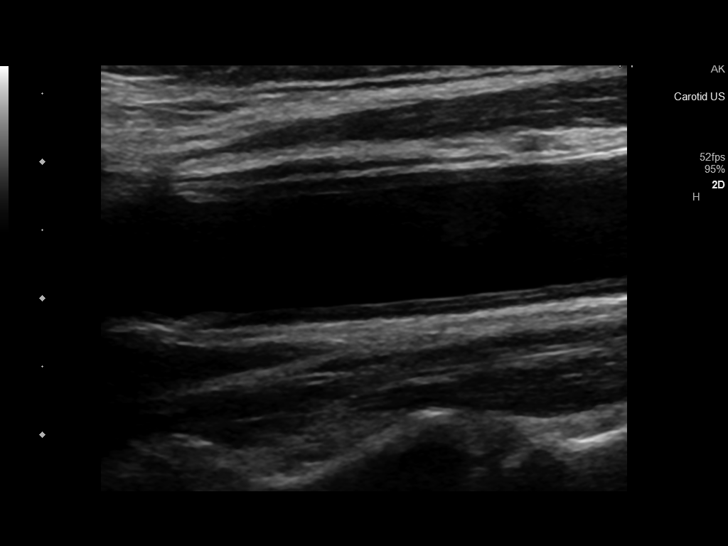
[im 29/112]
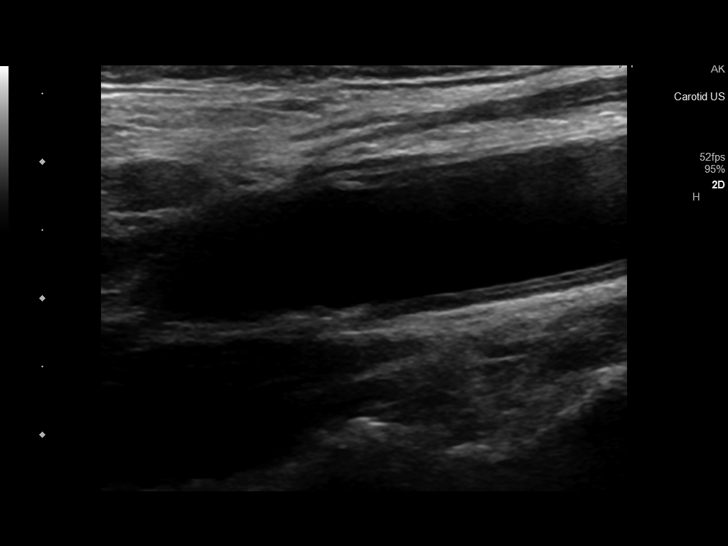
[im 39/112]
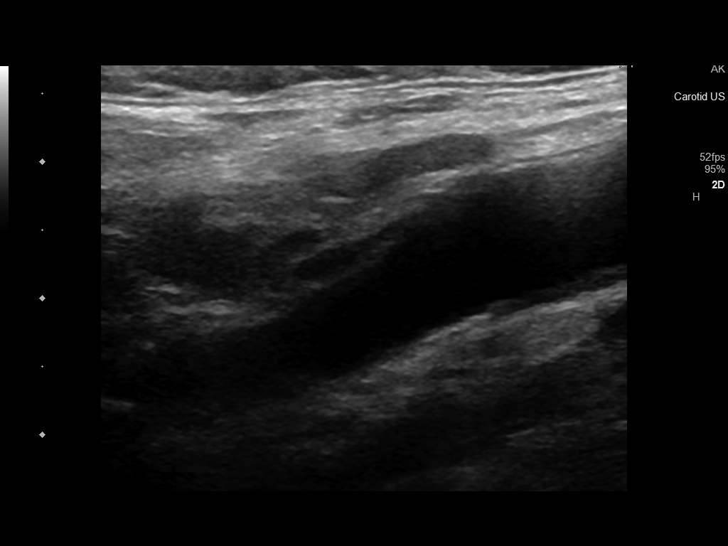
[im 49/112]
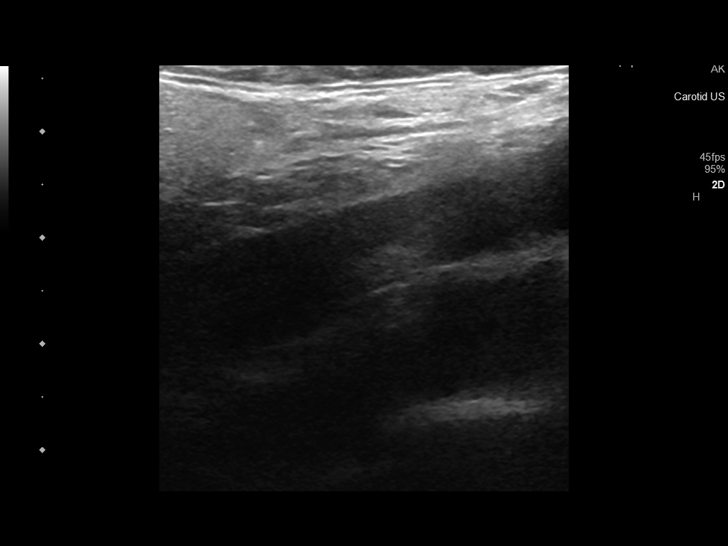
[im 58/112]
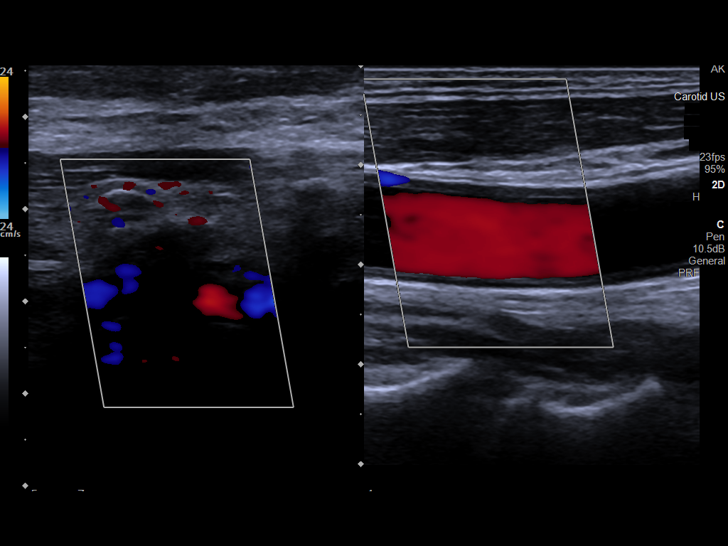
[im 63/112]
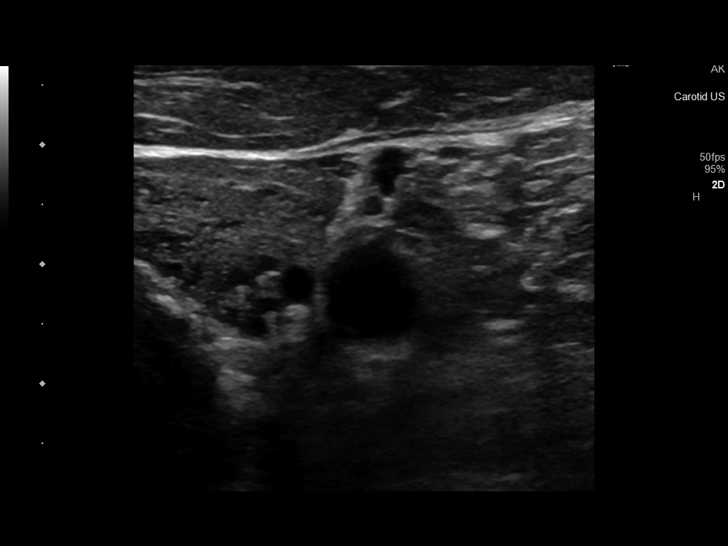
[im 73/112]
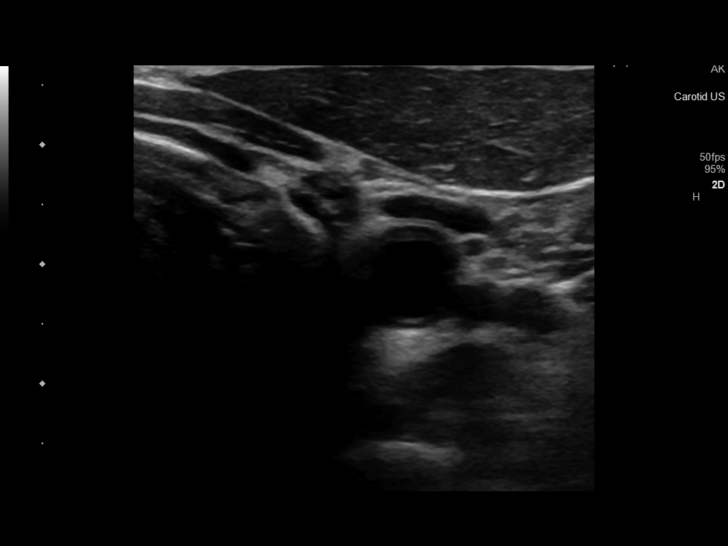
[im 83/112]
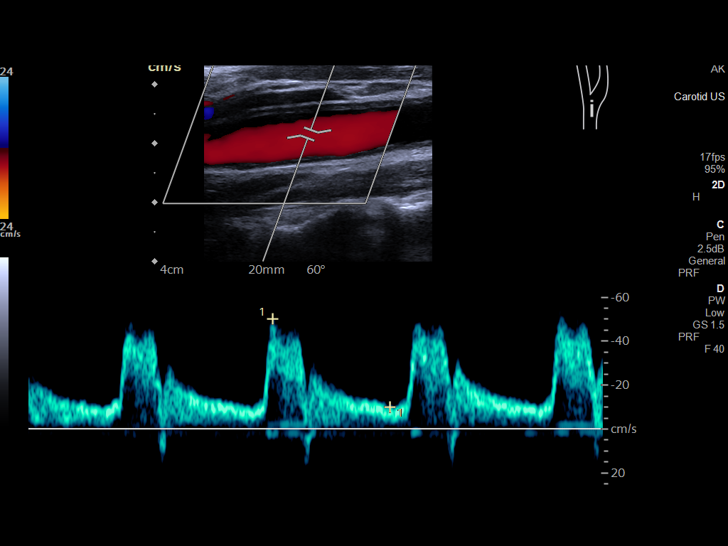
[im 92/112]
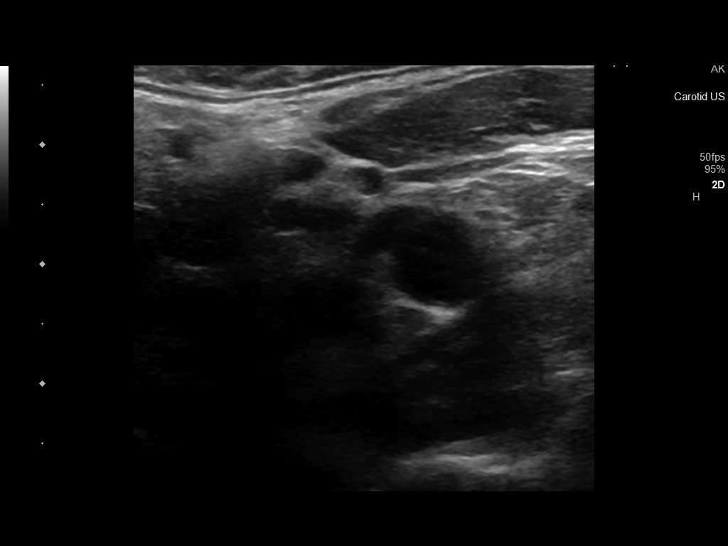
[im 102/112]
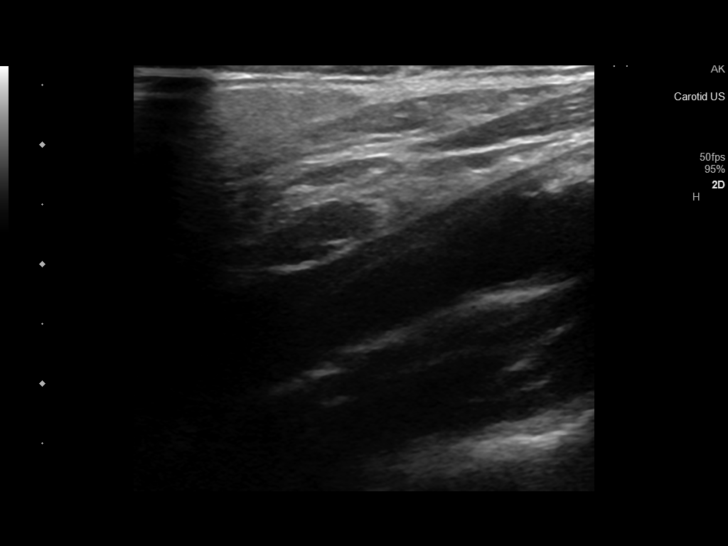
[im 112/112]
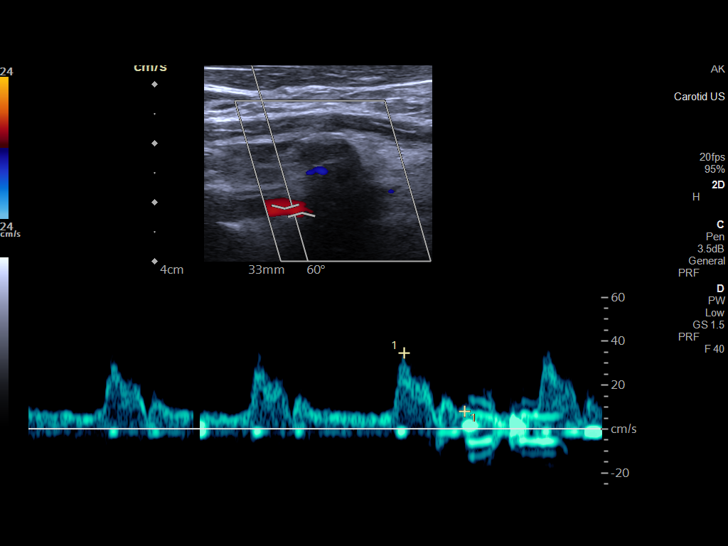

[13 of 24 positions shown; findings below may reference images not displayed]

FINDINGS: Criteria: Quantification of carotid stenosis is based on velocity
parameters that correlate the residual internal carotid diameter
with NASCET-based stenosis levels, using the diameter of the distal
internal carotid lumen as the denominator for stenosis measurement.

The following velocity measurements were obtained:

RIGHT
ICA: 71/19 cm/sec
CCA: 53/12 cm/sec

SYSTOLIC ICA/CCA RATIO:

ECA:  70 cm/sec

LEFT

ICA: 62/13 cm/sec

CCA: 50/10 cm/sec

SYSTOLIC ICA/CCA RATIO:

ECA:  78 cm/sec

RIGHT CAROTID ARTERY: Trace smooth heterogeneous atherosclerotic
plaque without evidence of significant stenosis.

RIGHT VERTEBRAL ARTERY:  Patent with normal antegrade flow.

LEFT CAROTID ARTERY: Trace smooth heterogeneous atherosclerotic
plaque in the proximal internal carotid artery without evidence of
stenosis.

LEFT VERTEBRAL ARTERY: Vertebral artery is patent with normal
antegrade flow.
IMPRESSION: 1. Trace smooth heterogeneous atherosclerotic plaque bilaterally
without evidence of internal carotid artery stenosis.
2. Vertebral arteries are patent with normal antegrade flow.

## 2020-08-06 IMAGING — CT CT HEAD W/O CM
4 series · 16 of 47 positions shown, 18 images · non-contrast
Comparison: None.

CLINICAL DATA: Fall

EXAM:
CT HEAD WITHOUT CONTRAST
CT CERVICAL SPINE WITHOUT CONTRAST
TECHNIQUE: Multidetector CT imaging of the head and cervical spine was
performed following the standard protocol without intravenous
contrast. Multiplanar CT image reconstructions of the cervical spine
were also generated.

[Series 2: head bone · axial · 0.47mm/px · z∈[-147,-113]mm · 3 of 85 slices shown]
[im 9/85  bone]
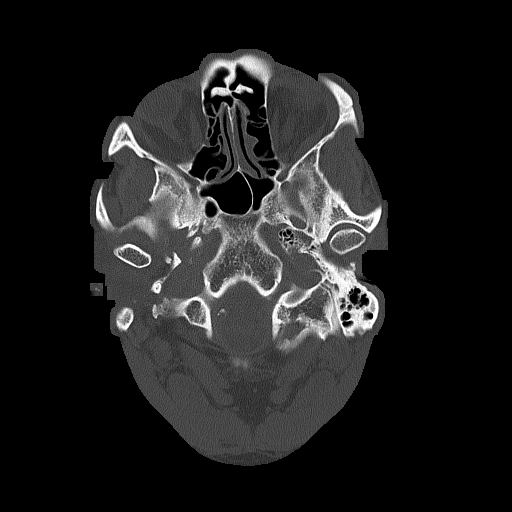
[im 17/85  bone]
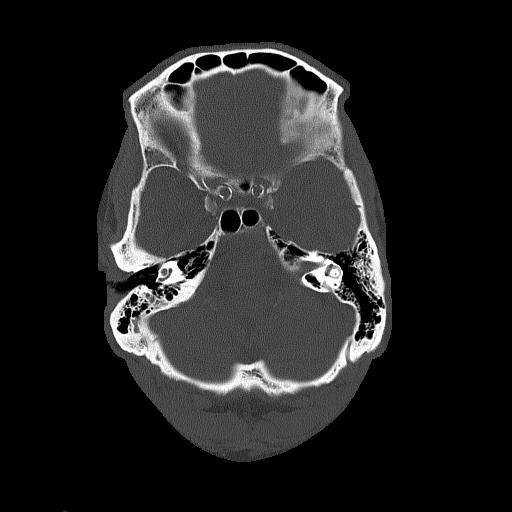
[im 26/85  bone]
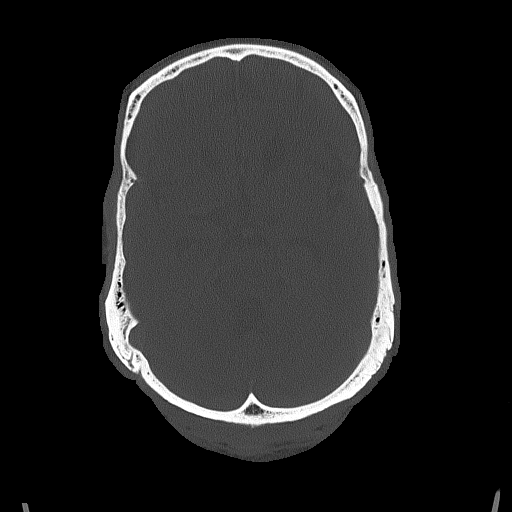

[Series 3: head wo · axial · 0.47mm/px · z∈[-143,-23]mm · 7 of 34 slices shown, 9 images]
[im 5/34  brain]
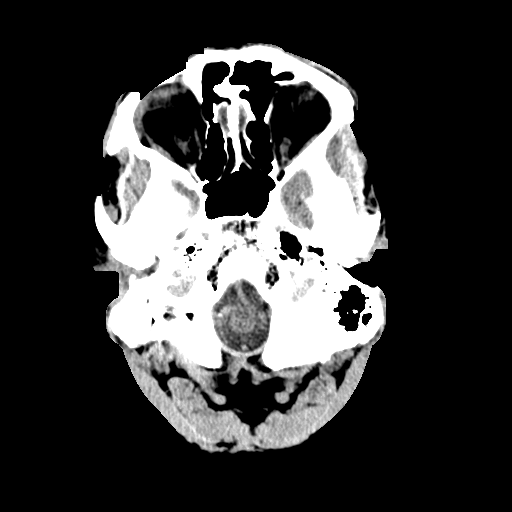
[im 5/34  bone]
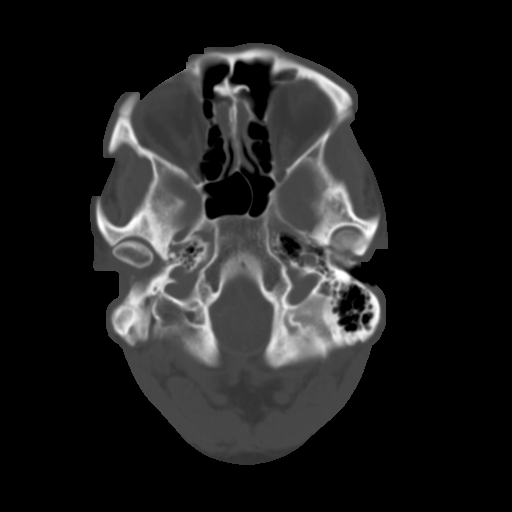
[im 9/34  brain]
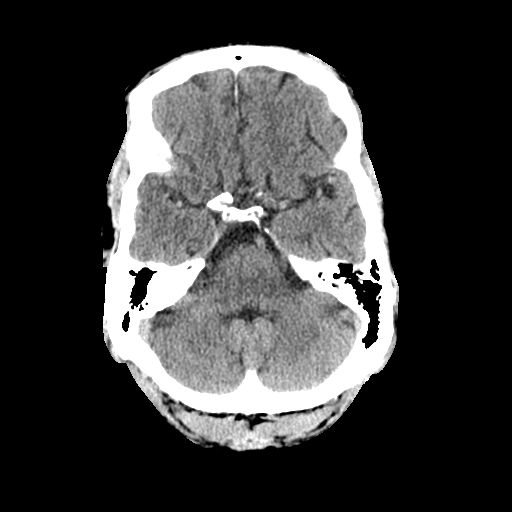
[im 13/34  brain]
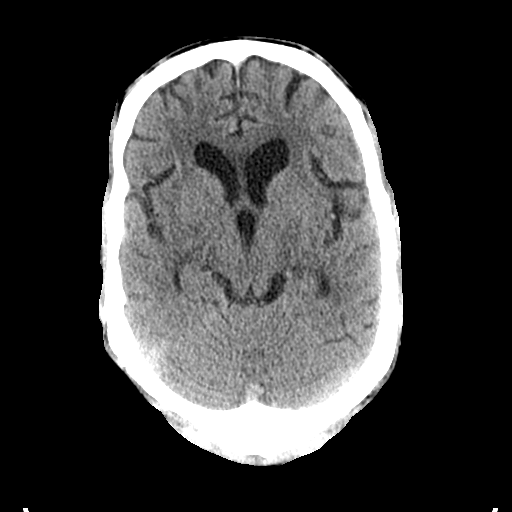
[im 17/34  brain]
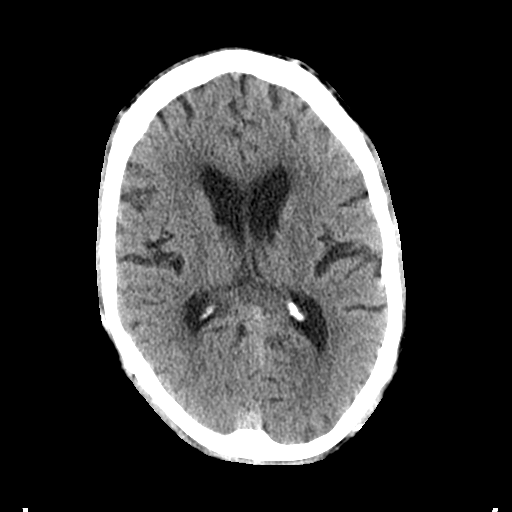
[im 21/34  brain]
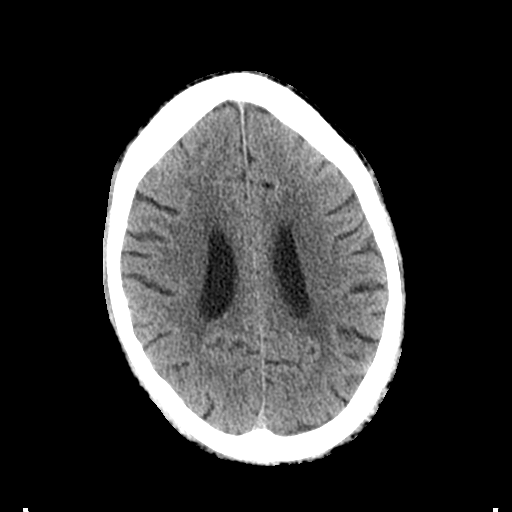
[im 21/34  bone]
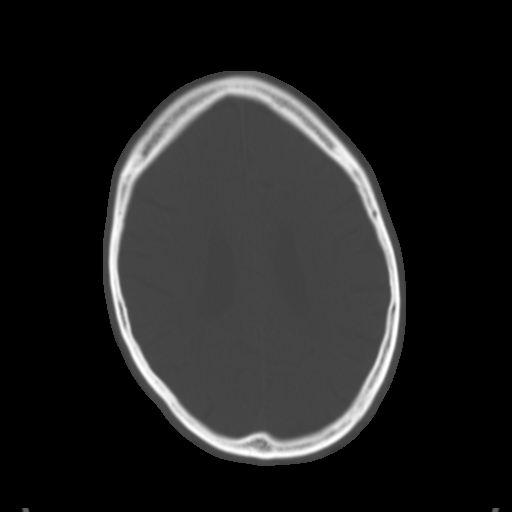
[im 25/34  brain]
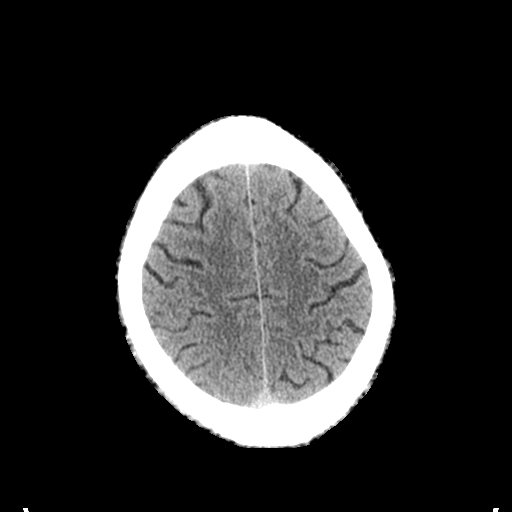
[im 29/34  brain]
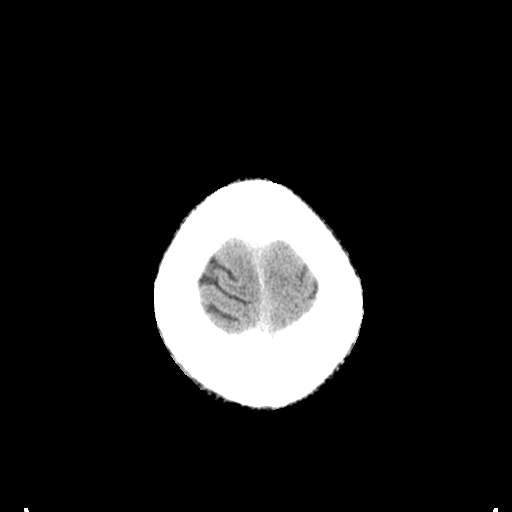

[Series 4: coronal soft tissue · coronal · 0.34mm/px · 3 of 73 slices shown]
[im 25/73  brain]
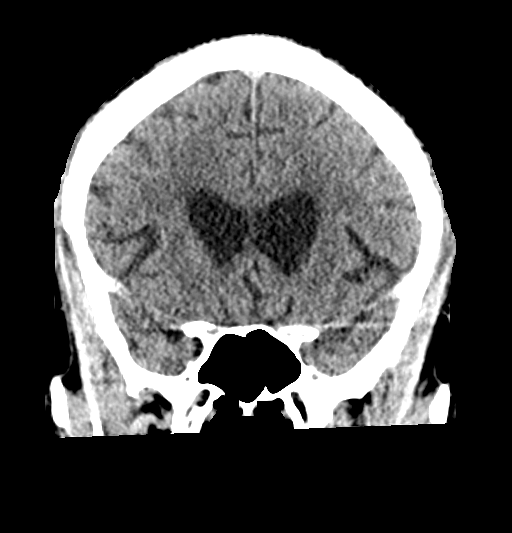
[im 33/73  brain]
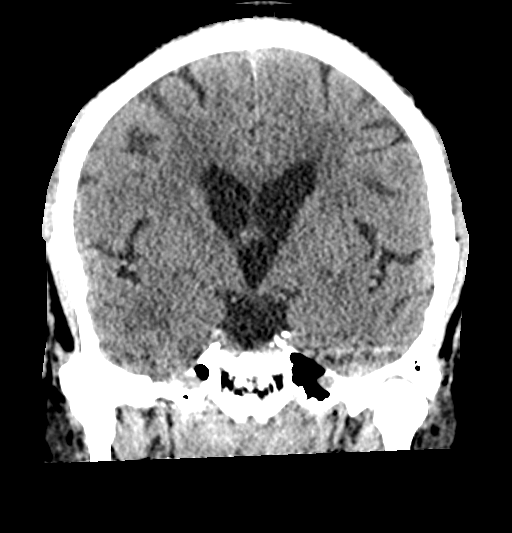
[im 41/73  brain]
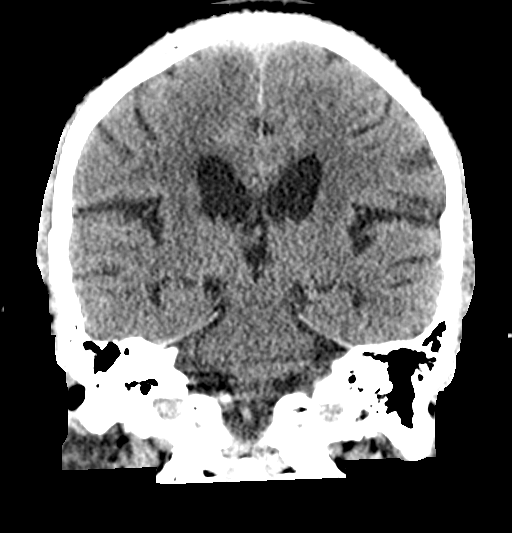

[Series 5: sagittal soft tissue · sagittal · 0.36mm/px · 3 of 55 slices shown]
[im 19/55  brain]
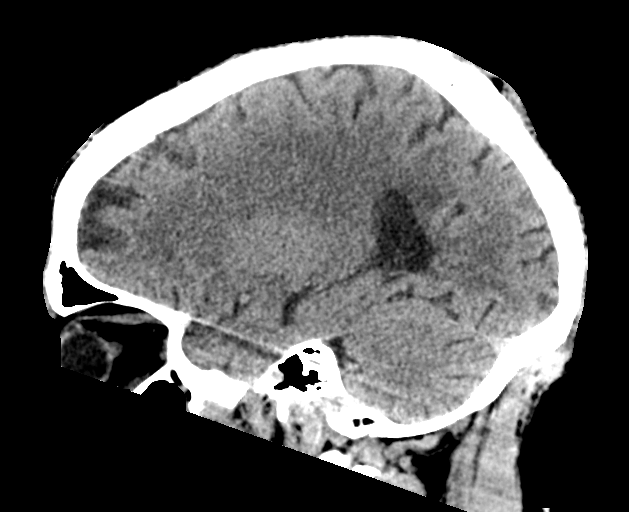
[im 28/55  brain]
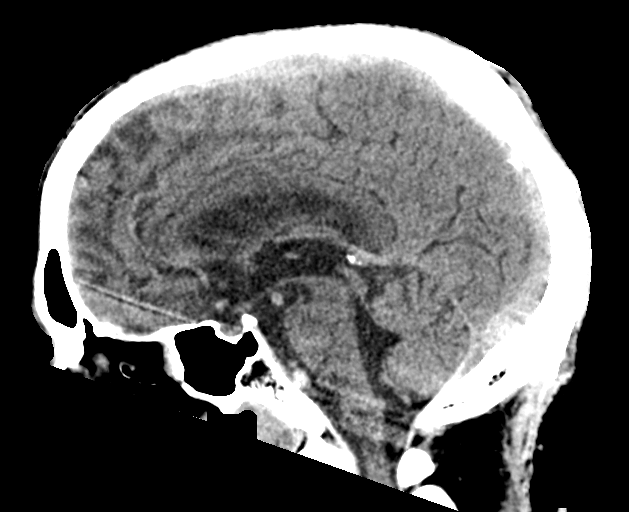
[im 37/55  brain]
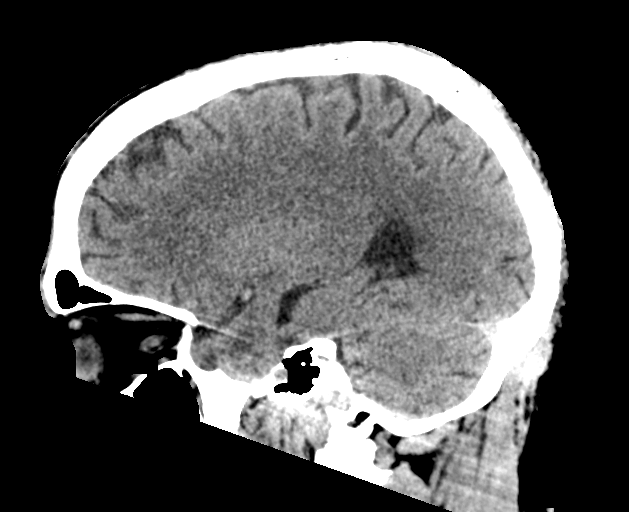

[16 of 47 positions shown; findings below may reference images not displayed]

FINDINGS: CT HEAD FINDINGS

Brain: There is no mass, hemorrhage or extra-axial collection. The
size and configuration of the ventricles and extra-axial CSF spaces
are normal. The brain parenchyma is normal, without evidence of
acute or chronic infarction.

Vascular: No abnormal hyperdensity of the major intracranial
arteries or dural venous sinuses. No intracranial atherosclerosis.

Skull: The visualized skull base, calvarium and extracranial soft
tissues are normal.

Sinuses/Orbits: No fluid levels or advanced mucosal thickening of
the visualized paranasal sinuses. No mastoid or middle ear effusion.
The orbits are normal.

CT CERVICAL SPINE FINDINGS

Alignment: No static subluxation. Facets are aligned. Occipital
condyles are normally positioned.

Skull base and vertebrae: No acute fracture.

Soft tissues and spinal canal: No prevertebral fluid or swelling. No
visible canal hematoma.

Disc levels: No advanced spinal canal or neural foraminal stenosis.

Upper chest: No pneumothorax, pulmonary nodule or pleural effusion.

Other: Normal visualized paraspinal cervical soft tissues.
IMPRESSION: 1. No acute intracranial abnormality.
2. No acute fracture or static subluxation of the cervical spine.

## 2020-08-06 IMAGING — CT CT CERVICAL SPINE W/O CM
3 of 4 series · 12 of 33 positions shown, 14 images · non-contrast
Comparison: None.

CLINICAL DATA: Fall

EXAM:
CT HEAD WITHOUT CONTRAST
CT CERVICAL SPINE WITHOUT CONTRAST
TECHNIQUE: Multidetector CT imaging of the head and cervical spine was
performed following the standard protocol without intravenous
contrast. Multiplanar CT image reconstructions of the cervical spine
were also generated.

[Series 4: sagittal bone · sagittal · 0.29mm/px · 5 of 76 slices shown, 6 images]
[im 26/76  bone]
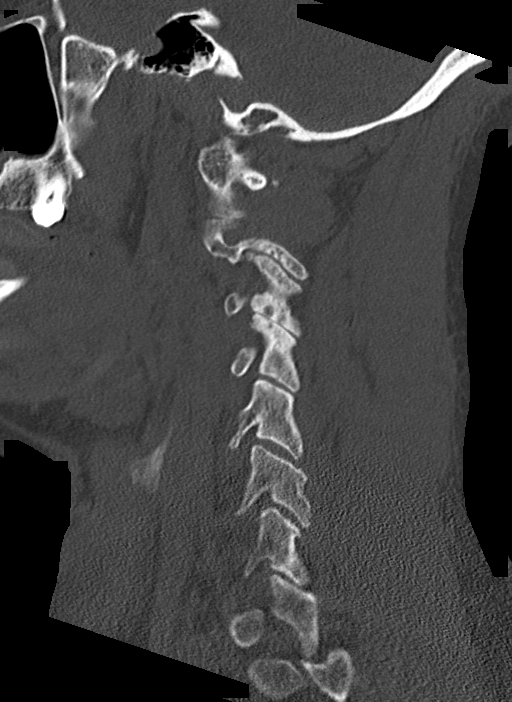
[im 32/76  bone]
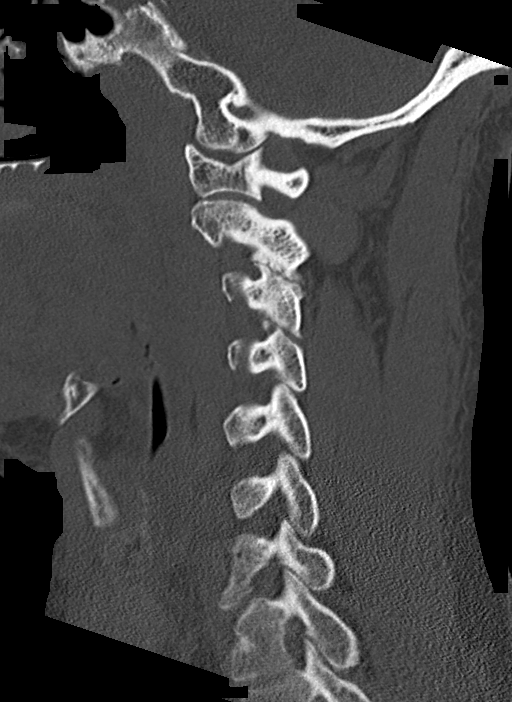
[im 38/76  soft-tissue]
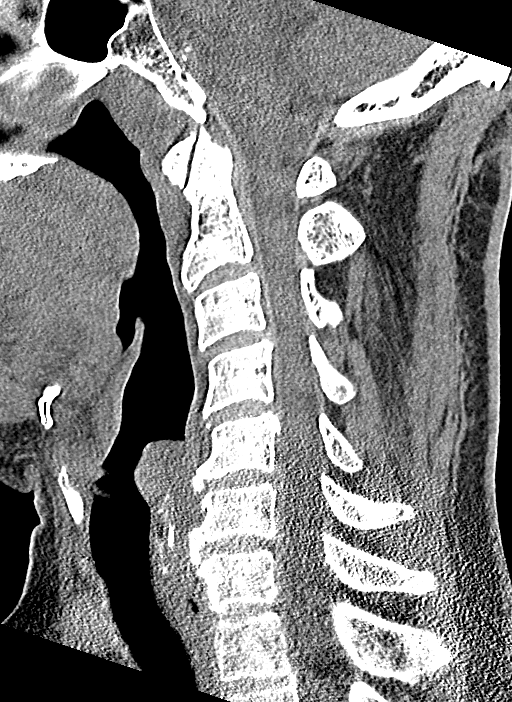
[im 38/76  bone]
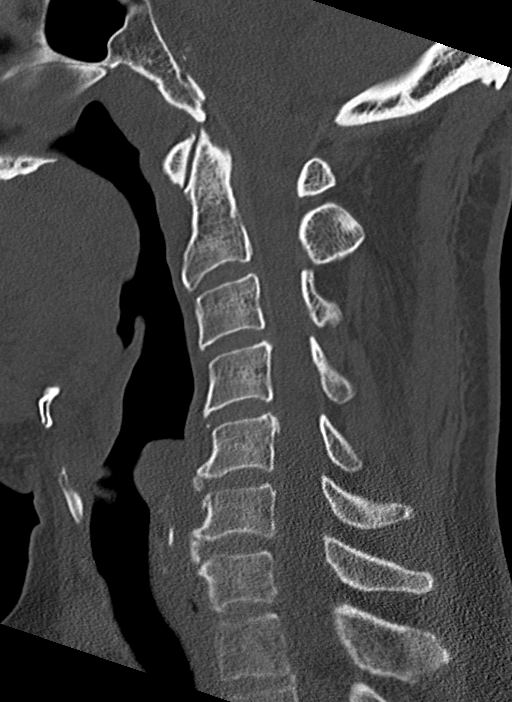
[im 44/76  bone]
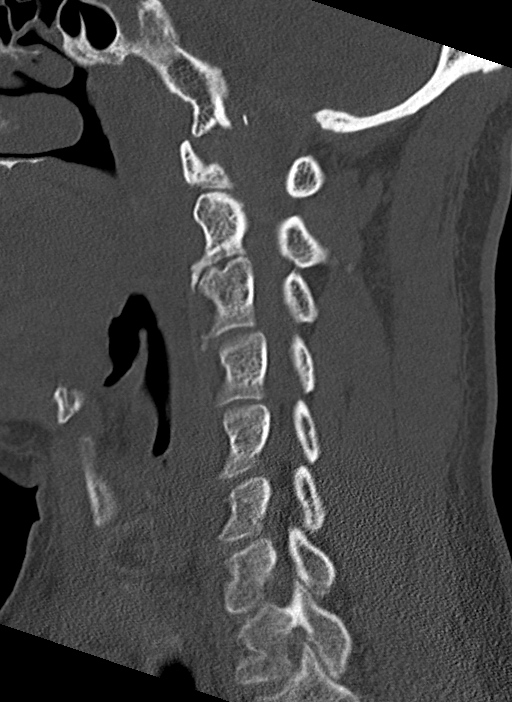
[im 51/76  bone]
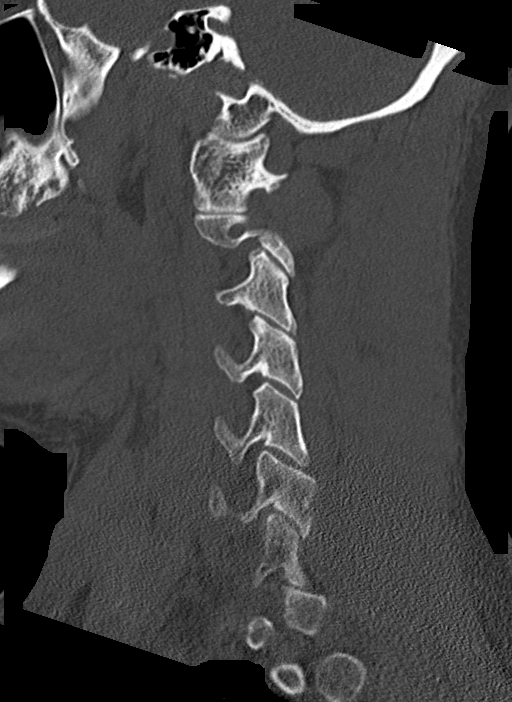

[Series 5: coronal bone · coronal · 0.26mm/px · 3 of 69 slices shown]
[im 15/69  bone]
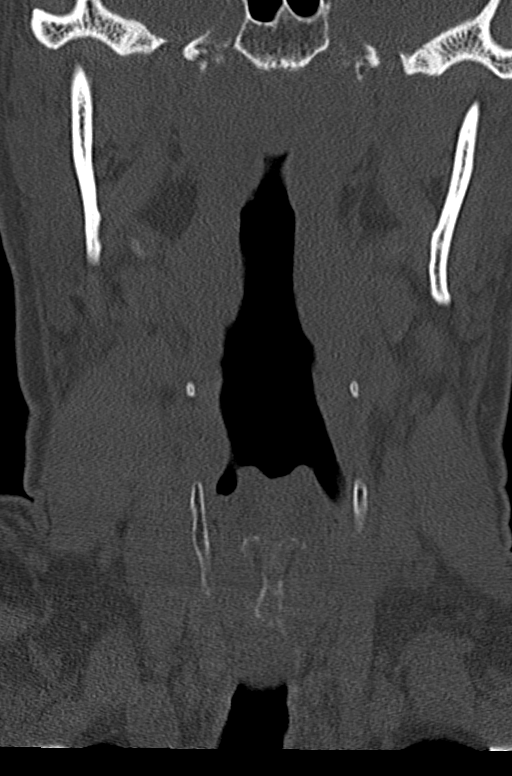
[im 28/69  bone]
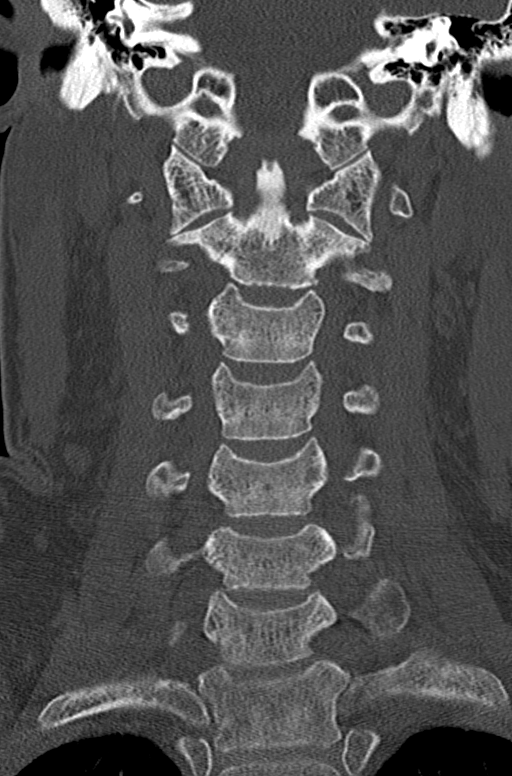
[im 41/69  bone]
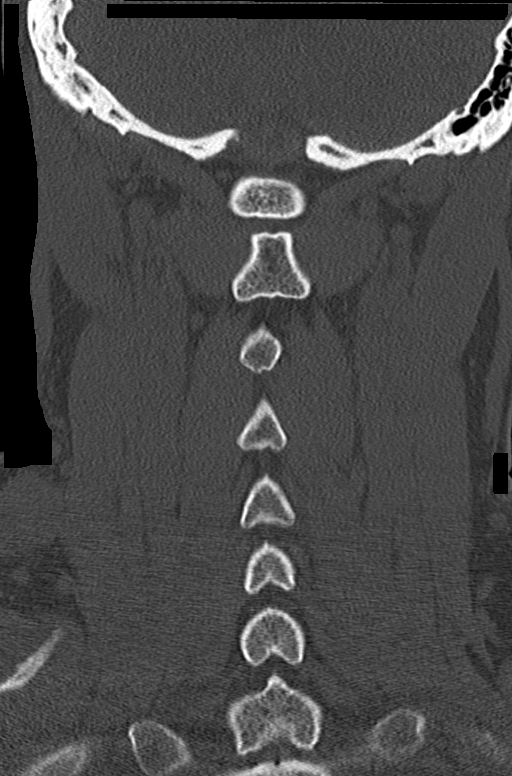

[Series 6: orthogonal bone · axial · 0.29mm/px · z∈[-324,-198]mm · 4 of 102 slices shown, 5 images]
[im 17/102  soft-tissue]
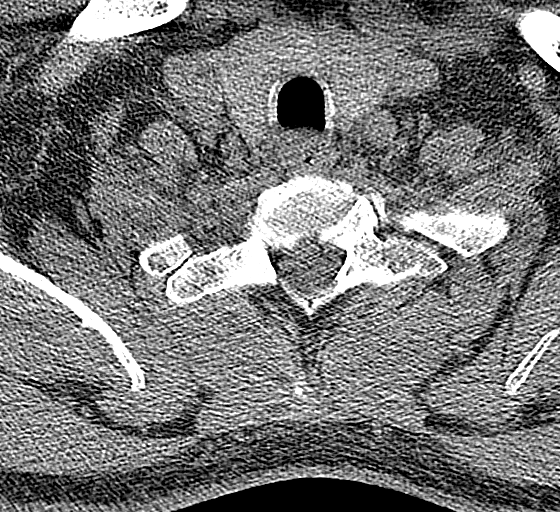
[im 17/102  bone]
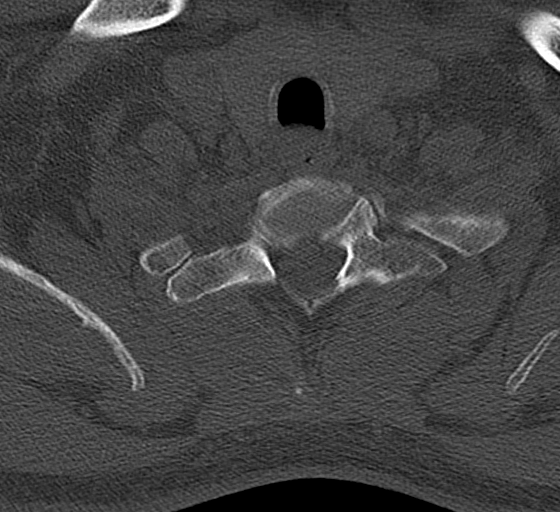
[im 34/102  bone]
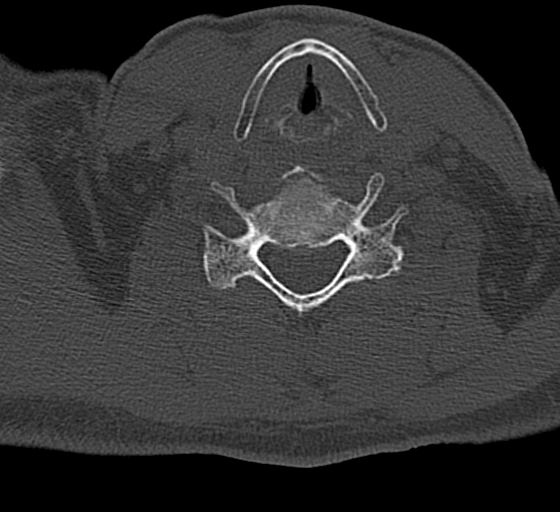
[im 68/102  bone]
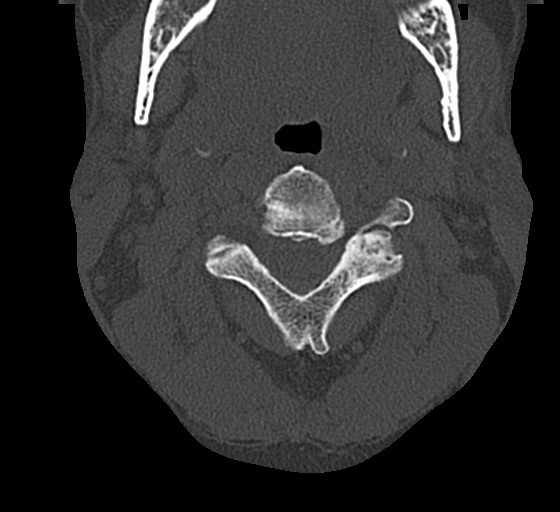
[im 85/102  bone]
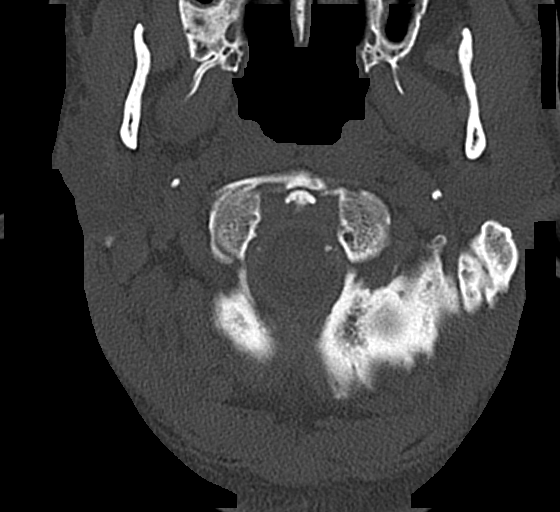

[12 of 33 positions shown; findings below may reference images not displayed]

FINDINGS: CT HEAD FINDINGS

Brain: There is no mass, hemorrhage or extra-axial collection. The
size and configuration of the ventricles and extra-axial CSF spaces
are normal. The brain parenchyma is normal, without evidence of
acute or chronic infarction.

Vascular: No abnormal hyperdensity of the major intracranial
arteries or dural venous sinuses. No intracranial atherosclerosis.

Skull: The visualized skull base, calvarium and extracranial soft
tissues are normal.

Sinuses/Orbits: No fluid levels or advanced mucosal thickening of
the visualized paranasal sinuses. No mastoid or middle ear effusion.
The orbits are normal.

CT CERVICAL SPINE FINDINGS

Alignment: No static subluxation. Facets are aligned. Occipital
condyles are normally positioned.

Skull base and vertebrae: No acute fracture.

Soft tissues and spinal canal: No prevertebral fluid or swelling. No
visible canal hematoma.

Disc levels: No advanced spinal canal or neural foraminal stenosis.

Upper chest: No pneumothorax, pulmonary nodule or pleural effusion.

Other: Normal visualized paraspinal cervical soft tissues.
IMPRESSION: 1. No acute intracranial abnormality.
2. No acute fracture or static subluxation of the cervical spine.

## 2020-08-06 MED ORDER — ONDANSETRON HCL 4 MG PO TABS: 4.0000 mg | ORAL_TABLET | Freq: Four times a day (QID) | ORAL | Status: DC | PRN

## 2020-08-06 MED ORDER — LIDOCAINE 5 % EX PTCH
1.0000 | MEDICATED_PATCH | Freq: Two times a day (BID) | CUTANEOUS | Status: DC
  Administered 2020-08-06 – 2020-08-07 (×2): 1 via TRANSDERMAL
  Filled 2020-08-06 (×10): qty 1

## 2020-08-06 MED ORDER — MAGNESIUM HYDROXIDE 400 MG/5ML PO SUSP: 30.0000 mL | Freq: Every day | ORAL | Status: DC | PRN

## 2020-08-06 MED ORDER — SODIUM CHLORIDE 0.9 % IV SOLN: INTRAVENOUS | Status: DC

## 2020-08-06 MED ORDER — INSULIN GLARGINE 100 UNIT/ML ~~LOC~~ SOLN
20.0000 [IU] | Freq: Two times a day (BID) | SUBCUTANEOUS | Status: DC
  Administered 2020-08-06 – 2020-08-10 (×9): 20 [IU] via SUBCUTANEOUS
  Filled 2020-08-06 (×13): qty 0.2

## 2020-08-06 MED ORDER — PANTOPRAZOLE SODIUM 40 MG PO TBEC
40.0000 mg | DELAYED_RELEASE_TABLET | Freq: Two times a day (BID) | ORAL | Status: DC
  Administered 2020-08-06 – 2020-08-10 (×8): 40 mg via ORAL
  Filled 2020-08-06 (×8): qty 1

## 2020-08-06 MED ORDER — LABETALOL HCL 5 MG/ML IV SOLN
20.0000 mg | INTRAVENOUS | Status: DC | PRN
  Administered 2020-08-06: 20 mg via INTRAVENOUS
  Filled 2020-08-06: qty 4

## 2020-08-06 MED ORDER — INSULIN ASPART 100 UNIT/ML IJ SOLN
0.0000 [IU] | Freq: Three times a day (TID) | INTRAMUSCULAR | Status: DC
  Administered 2020-08-06 – 2020-08-07 (×4): 15 [IU] via SUBCUTANEOUS
  Administered 2020-08-07: 7 [IU] via SUBCUTANEOUS
  Administered 2020-08-07: 3 [IU] via SUBCUTANEOUS
  Administered 2020-08-08 – 2020-08-09 (×5): 4 [IU] via SUBCUTANEOUS
  Filled 2020-08-06 (×11): qty 1

## 2020-08-06 MED ORDER — SODIUM CHLORIDE 0.9 % IV BOLUS
500.0000 mL | Freq: Once | INTRAVENOUS | Status: AC
  Administered 2020-08-06: 500 mL via INTRAVENOUS

## 2020-08-06 MED ORDER — ENOXAPARIN SODIUM 40 MG/0.4ML IJ SOSY
40.0000 mg | PREFILLED_SYRINGE | INTRAMUSCULAR | Status: DC
  Administered 2020-08-06 – 2020-08-10 (×5): 40 mg via SUBCUTANEOUS
  Filled 2020-08-06 (×5): qty 0.4

## 2020-08-06 MED ORDER — TRAZODONE HCL 50 MG PO TABS
25.0000 mg | ORAL_TABLET | Freq: Every evening | ORAL | Status: DC | PRN
  Administered 2020-08-06 – 2020-08-09 (×3): 25 mg via ORAL
  Filled 2020-08-06 (×3): qty 1

## 2020-08-06 MED ORDER — CLONIDINE HCL 0.1 MG PO TABS
0.2000 mg | ORAL_TABLET | Freq: Two times a day (BID) | ORAL | Status: DC
  Administered 2020-08-06 – 2020-08-10 (×9): 0.2 mg via ORAL
  Filled 2020-08-06 (×9): qty 2

## 2020-08-06 MED ORDER — ATORVASTATIN CALCIUM 20 MG PO TABS
40.0000 mg | ORAL_TABLET | Freq: Every evening | ORAL | Status: DC
  Administered 2020-08-06 – 2020-08-09 (×4): 40 mg via ORAL
  Filled 2020-08-06 (×4): qty 2

## 2020-08-06 MED ORDER — ACETAMINOPHEN 650 MG RE SUPP: 650.0000 mg | Freq: Four times a day (QID) | RECTAL | Status: DC | PRN

## 2020-08-06 MED ORDER — ACETAMINOPHEN 325 MG PO TABS: 650.0000 mg | ORAL_TABLET | Freq: Four times a day (QID) | ORAL | Status: DC | PRN

## 2020-08-06 MED ORDER — ONDANSETRON HCL 4 MG/2ML IJ SOLN: 4.0000 mg | Freq: Four times a day (QID) | INTRAMUSCULAR | Status: DC | PRN

## 2020-08-06 MED ORDER — AMLODIPINE BESYLATE 10 MG PO TABS
10.0000 mg | ORAL_TABLET | Freq: Every day | ORAL | Status: DC
  Administered 2020-08-06 – 2020-08-10 (×5): 10 mg via ORAL
  Filled 2020-08-06 (×3): qty 1
  Filled 2020-08-06: qty 2
  Filled 2020-08-06: qty 1

## 2020-08-06 NOTE — H&P (Signed)
[place]   PATIENT NAME: Steve Alvarado    MR#:  [id number]  DATE OF BIRTH:  1955/10/14  DATE OF ADMISSION:  08/06/2020  PRIMARY CARE PHYSICIAN: Ellery Plunk, MD   Patient is coming from: Home  REQUESTING/REFERRING PHYSICIAN: Loleta Rose, MD  CHIEF COMPLAINT:   Chief Complaint  Patient presents with  . Fall    HISTORY OF PRESENT ILLNESS:  Steve Alvarado is a 65 y.o. African-American male with medical history significant for coronary artery disease, diabetes mellitus, hypertension, dyslipidemia and hypothyroidism, who presented to the ER with the onset of syncope.  [facility]  He had an unwitnessed fall there.  He was found with his pants down on the ground.  He reportedly hit the back of his head.  He also had urinary incontinence and decreased responsiveness.  He has no history of seizures and did not have witnessed seizures.  He has subsequent left lower lateral ribs pain.  No headache or paresthesias or focal muscle weakness.  No neck pain or stiffness.  No chest pain or palpitations.  No nausea or vomiting or abdominal pain.  No dysuria, oliguria or hematuria or flank pain.  He was fairly somnolent but arousable during the interview.  ED Course: Upon presentation to the ER, blood pressure was 163/86 and later 204/109 with otherwise normal vital signs.  Labs revealed hyponatremia 129 hypochloremia 97, hyperglycemia of 407, CO2 of 21 anion gap of 11 and total bili 1.5.  High-sensitivity troponin was 2224.  CBC was within normal.  Alcohol was less than 10.  Urine drug screen was positive for cocaine EKG as reviewed by me :  showed sinus rhythm with a rate of 81 with poor R wave progression and prolonged PR interval. Imaging: No acute findings on his chest x-ray.  Head and C-spine CT showed no acute abnormalities.  The patient was given a bolus of normal IV normal saline.  Will be admitted to AN observation  team out of bed to reevaluation and management. PAST MEDICAL HISTORY:   Past Medical History:  Diagnosis Date  . Coronary artery disease   . Diabetes mellitus    Type II  . Hyperlipidemia   . Hypertension   . Thyroid disease     PAST SURGICAL HISTORY:   Past Surgical History:  Procedure Laterality Date  . CARDIAC CATHETERIZATION  08/06/2010   Bare metal stent placed in RCA.  Marland Kitchen HAND SURGERY     left    SOCIAL HISTORY:   Social History   Tobacco Use  . Smoking status: Never Smoker  . Smokeless tobacco: Never Used  Substance Use Topics  . Alcohol use: No    FAMILY HISTORY:   Family History  Problem Relation Age of Onset  . Heart attack Mother     DRUG ALLERGIES:  No Known Allergies  REVIEW OF SYSTEMS:   ROS As per history of present illness. All pertinent systems were reviewed above. Constitutional, HEENT, cardiovascular, respiratory, GI, GU, musculoskeletal, neuro, psychiatric, endocrine, integumentary and hematologic systems were reviewed and are otherwise negative/unremarkable except for positive findings mentioned above in the HPI.   MEDICATIONS AT HOME:   Prior to Admission medications   Medication Sig Start Date End Date Taking? Authorizing Provider  amLODipine (NORVASC) 10 MG tablet Take 1 tablet (10 mg total) by mouth daily. 10/30/14 01/27/16  Darci Current, MD  atorvastatin (LIPITOR) 40 MG tablet Take 40 mg  by mouth every evening.    [provider]  cloNIDine (CATAPRES) 0.2 MG tablet Take 1 tablet (0.2 mg total) by mouth 2 (two) times daily. 10/30/14   Darci Current, MD  insulin glargine (LANTUS) 100 unit/mL SOPN Inject 20 Units into the skin 2 (two) times daily.    [provider]  lidocaine (LIDODERM) 5 % Place 1 patch onto the skin every 12 (twelve) hours. Remove & Discard patch within 12 hours or as directed by MD 08/05/20 08/05/21  Joni Reining, PA-C  metFORMIN (GLUCOPHAGE) 1000 MG tablet Take 1 tablet (1,000 mg total) by  mouth 2 (two) times daily. Patient taking differently: Take 1,000 mg by mouth daily.  10/20/11   Antonieta Iba, MD  pantoprazole (PROTONIX) 40 MG tablet Take 1 tablet (40 mg total) by mouth 2 (two) times daily. 03/10/15   Enedina Finner, MD  traMADol (ULTRAM) 50 MG tablet Take 1 tablet (50 mg total) by mouth every 12 (twelve) hours as needed. 08/05/20   Joni Reining, PA-C      VITAL SIGNS:  Blood pressure (!) 174/94, pulse 83, temperature 98.8 F (37.1 C), temperature source Oral, resp. rate 16, SpO2 95 %.  PHYSICAL EXAMINATION:  Physical Exam  GENERAL:  65 y.o.-year-old African-American male patient lying in the bed with no acute distress.  He was somnolent but arousable. EYES: Pupils equal, round, reactive to light and accommodation. No scleral icterus. Extraocular muscles intact.  HEENT: Head atraumatic, normocephalic. Oropharynx and nasopharynx clear.  NECK:  Supple, no jugular venous distention. No thyroid enlargement, no tenderness.  LUNGS: Normal breath sounds bilaterally, no wheezing, rales,rhonchi or crepitation. No use of accessory muscles of respiration.  CARDIOVASCULAR: Regular rate and rhythm, S1, S2 normal. No murmurs, rubs, or gallops.  ABDOMEN: Soft, nondistended, nontender. Bowel sounds present. No organomegaly or mass.  EXTREMITIES: No pedal edema, cyanosis, or clubbing.  NEUROLOGIC: Cranial nerves II through XII are intact. Muscle strength 5/5 in all extremities. Sensation intact. Gait not checked.  PSYCHIATRIC: The patient is alert and oriented x 3.  Normal affect and good eye contact. SKIN: No obvious rash, lesion, or ulcer.   LABORATORY PANEL:   CBC Recent Labs  Lab 08/06/20 0035  WBC 7.1  HGB 14.4  HCT 40.0  PLT 219   ------------------------------------------------------------------------------------------------------------------  Chemistries  Recent Labs  Lab 08/06/20 0141  NA 129*  K 4.5  CL 97*  CO2 21*  GLUCOSE 407*  BUN 21  CREATININE  1.16  CALCIUM 9.2  AST 27  ALT 22  ALKPHOS 73  BILITOT 1.5*   ------------------------------------------------------------------------------------------------------------------  Cardiac Enzymes No results for input(s): TROPONINI in the last 168 hours. ------------------------------------------------------------------------------------------------------------------  RADIOLOGY:  DG Ribs Unilateral W/Chest Left  Result Date: 08/06/2020 CLINICAL DATA:  more pain after fall tonight in same area as previous pain EXAM: LEFT RIBS AND CHEST - 3+ VIEW COMPARISON:  Aug 05, 2020. FINDINGS: No displaced fracture or other bone lesions are seen involving the ribs. There is no evidence of pneumothorax or pleural effusion. Both lungs are clear. Heart size and mediastinal contours are within normal limits. IMPRESSION: No acute findings. Electronically Signed   By: Maudry Mayhew MD   On: 08/06/2020 02:43   DG Ribs Unilateral W/Chest Left  Result Date: 08/05/2020 CLINICAL DATA:  Fall, left lateral rib pain, initial encounter. EXAM: LEFT RIBS AND CHEST - 3+ VIEW COMPARISON:  Chest radiograph 10/12/2019. FINDINGS: Trachea is midline. Heart size stable. Lungs are clear. No pleural fluid. Dedicated views  of the left ribs show no definite acute fracture. IMPRESSION: No acute findings. Electronically Signed   By: Leanna BattlesMelinda  Blietz M.D.   On: 08/05/2020 10:37   CT Head Wo Contrast  Result Date: 08/06/2020 CLINICAL DATA:  Fall EXAM: CT HEAD WITHOUT CONTRAST CT CERVICAL SPINE WITHOUT CONTRAST TECHNIQUE: Multidetector CT imaging of the head and cervical spine was performed following the standard protocol without intravenous contrast. Multiplanar CT image reconstructions of the cervical spine were also generated. COMPARISON:  None. FINDINGS: CT HEAD FINDINGS Brain: There is no mass, hemorrhage or extra-axial collection. The size and configuration of the ventricles and extra-axial CSF spaces are normal. The brain parenchyma  is normal, without evidence of acute or chronic infarction. Vascular: No abnormal hyperdensity of the major intracranial arteries or dural venous sinuses. No intracranial atherosclerosis. Skull: The visualized skull base, calvarium and extracranial soft tissues are normal. Sinuses/Orbits: No fluid levels or advanced mucosal thickening of the visualized paranasal sinuses. No mastoid or middle ear effusion. The orbits are normal. CT CERVICAL SPINE FINDINGS Alignment: No static subluxation. Facets are aligned. Occipital condyles are normally positioned. Skull base and vertebrae: No acute fracture. Soft tissues and spinal canal: No prevertebral fluid or swelling. No visible canal hematoma. Disc levels: No advanced spinal canal or neural foraminal stenosis. Upper chest: No pneumothorax, pulmonary nodule or pleural effusion. Other: Normal visualized paraspinal cervical soft tissues. IMPRESSION: 1. No acute intracranial abnormality. 2. No acute fracture or static subluxation of the cervical spine. Electronically Signed   By: Deatra RobinsonKevin  Herman M.D.   On: 08/06/2020 02:31   CT Cervical Spine Wo Contrast  Result Date: 08/06/2020 CLINICAL DATA:  Fall EXAM: CT HEAD WITHOUT CONTRAST CT CERVICAL SPINE WITHOUT CONTRAST TECHNIQUE: Multidetector CT imaging of the head and cervical spine was performed following the standard protocol without intravenous contrast. Multiplanar CT image reconstructions of the cervical spine were also generated. COMPARISON:  None. FINDINGS: CT HEAD FINDINGS Brain: There is no mass, hemorrhage or extra-axial collection. The size and configuration of the ventricles and extra-axial CSF spaces are normal. The brain parenchyma is normal, without evidence of acute or chronic infarction. Vascular: No abnormal hyperdensity of the major intracranial arteries or dural venous sinuses. No intracranial atherosclerosis. Skull: The visualized skull base, calvarium and extracranial soft tissues are normal.  Sinuses/Orbits: No fluid levels or advanced mucosal thickening of the visualized paranasal sinuses. No mastoid or middle ear effusion. The orbits are normal. CT CERVICAL SPINE FINDINGS Alignment: No static subluxation. Facets are aligned. Occipital condyles are normally positioned. Skull base and vertebrae: No acute fracture. Soft tissues and spinal canal: No prevertebral fluid or swelling. No visible canal hematoma. Disc levels: No advanced spinal canal or neural foraminal stenosis. Upper chest: No pneumothorax, pulmonary nodule or pleural effusion. Other: Normal visualized paraspinal cervical soft tissues. IMPRESSION: 1. No acute intracranial abnormality. 2. No acute fracture or static subluxation of the cervical spine. Electronically Signed   By: Deatra RobinsonKevin  Herman M.D.   On: 08/06/2020 02:31      IMPRESSION AND PLAN:  Active Problems:   Syncope  1.  Syncope. - The patient will be admitted to an observation medically monitored bed.  -We will follow serial troponin I's given mildly elevated troponins. - Will check orthostatics q 12 hours.   -Will obtain a bilateral carotid Doppler and 2D echo. - The patient will be gently hydrated with IV normal saline and monitored for arrhythmias. -Differential diagnoses would include neurally mediated syncope, cardiogenic, arrhythmias related,  orthostatic hypotension and less likely  hypoglycemia.  2.  Hypertensive urgency. - Patient will be placed on as needed IV labetalol. - We will continue his antihypertensives.  3.  Uncontrolled type 2 diabetes mellitus with hyperglycemia and peripheral neuropathy. - The patient will be placed on supplemental coverage of NovoLog and continue his basal coverage. - We will hold off metformin. -Will continue Neurontin  4.  Hypothyroidism. - We will continue Synthroid.  5.  Dyslipidemia. -We will continue statin therapy.  6.  GERD. - Continue PPI therapy  DVT prophylaxis: Lovenox. Code Status: full code. Family  Communication:  The plan of care was discussed in details with the patient (and family). I answered all questions. The patient agreed to proceed with the above mentioned plan. Further management will depend upon hospital course. Disposition Plan: Back to previous home environment Consults called: none. All the records are reviewed and case discussed with ED provider.  Status is: Observation  The patient remains OBS appropriate and will d/c before 2 midnights.  Dispo: The patient is from: Home              Anticipated d/c is to: Home              Patient currently is not medically stable to d/c.   Difficult to place patient No   TOTAL TIME TAKING CARE OF THIS PATIENT: 55 minutes.    Hannah Beat M.D on 08/06/2020 at 4:14 AM  [facility]   From 7 PM-7 AM, contact night-coverage [email or web address]  CC: Primary care physician; Ellery Plunk, MD

## 2020-08-06 NOTE — ED Notes (Signed)
Pt provided with urinal at this time. Will collect urine sample when pt is able to urinate.

## 2020-08-06 NOTE — Progress Notes (Signed)
Inpatient Diabetes Program Recommendations  AACE/ADA: New Consensus Statement on Inpatient Glycemic Control (2015)  Target Ranges:  Prepandial:   less than 140 mg/dL      Peak postprandial:   less than 180 mg/dL (1-2 hours)      Critically ill patients:  140 - 180 mg/dL   Lab Results  Component Value Date   GLUCAP 325 (H) 08/06/2020   HGBA1C 13.9 (H) 08/06/2020    Review of Glycemic Control Results for Steve Alvarado, Steve Alvarado (MRN [id number]) as of 08/06/2020 11:55  Ref. Range 08/06/2020 01:16 08/06/2020 07:32  Glucose-Capillary Latest Ref Range: 70 - 99 mg/dL 782 (H) 956 (H)   Diabetes history: DM 2 Outpatient Diabetes medications:  Lantus 20 units bid Metformin 1000 mg daily Current orders for Inpatient glycemic control:  Novolog resistant tid with meals and HS Lantus 20 units bid Inpatient Diabetes Program Recommendations:    Note markedly elevated A1C indicating average CBG's are approximately 352 mg/dL.  It is not clear whether patient was taking insulin prior to admit?  Will follow up with patient when appropriate regarding DM care.    Thanks  Beryl Meager, RN, BC-ADM Inpatient Diabetes Coordinator Pager [phone] (8a-5p)

## 2020-08-06 NOTE — ED Triage Notes (Addendum)
Pt presents to ER via [facility] after an unwitnessed fall about 1 hr ago.  Other residents heard pt fall in room and pt was found lying supine with pants down. Pt reportedly hit back of head, but there is no bleeding or hematoma noted. Pt had urinated on himself and reportedly was non-responsive for a short period of time.  No known hx of seizures.  Pt was seen here for fall on 5/18 as well.  Pt currently A&O x4.  Pt has hx of diabetes and has not been taking meds.  BGL with ems 485.  400 cc NS given en route.

## 2020-08-06 NOTE — ED Provider Notes (Signed)
[facility] Emergency Department Provider Note  ____________________________________________   Event Date/Time   First MD Initiated Contact with Patient 08/06/20 0031     (approximate)  I have reviewed the triage vital signs and the nursing notes.   HISTORY  Chief Complaint Fall  Level 5 caveat: Patient's history is limited by him being a vague historian and minimally cooperative with the history.  HPI Steve Alvarado is a 65 y.o. male with medical history as listed below and currently some social limitations and apparently [facility].  He presents tonight by EMS after  having an episode [facility] the [facility].  Reportedly he had an unwitnessed fall.  Some of the other residents heard the patient fall in his room and he was found with his pants down on the ground.  There was a report that he had hit the back of his head.  Reportedly he had urinated on himself and he had a decreased level responsiveness.  The patient tells me he has no history of seizures and he is not sure what happened.  He denies any pain currently except pain in his left lower lateral ribs which was present yesterday and for which she was seen in the emergency department yesterday.  It is unclear and he cannot quantify whether it hurts more now than he used to.  He denies headache and neck pain.  He denies shortness of breath and chest pain.  He denies any numbness, weakness, nor tingling in any of his extremities.  He mostly wants to be left alone.  Apparently the onset of the event was acute, severe, and nothing in particular made it better or worse.        Past Medical History:  Diagnosis Date  . Coronary artery disease   . Diabetes mellitus    Type II  . Hyperlipidemia   . Hypertension   . Thyroid disease     Patient Active Problem List   Diagnosis Date Noted  . Syncope 08/06/2020  . Intractable abdominal pain 03/08/2015  . Hypertensive urgency  03/08/2015  . Intractable nausea and vomiting 03/07/2015  . HTN (hypertension) 02/14/2011  . CAD (coronary artery disease) 08/13/2010  . Stented coronary artery 08/13/2010  . Diabetes mellitus (HCC) 08/13/2010  . Hyperlipemia 08/13/2010    Past Surgical History:  Procedure Laterality Date  . CARDIAC CATHETERIZATION  08/06/2010   Bare metal stent placed in RCA.  Marland Kitchen HAND SURGERY     left    Prior to Admission medications   Medication Sig Start Date End Date Taking? Authorizing Provider  amLODipine (NORVASC) 10 MG tablet Take 1 tablet (10 mg total) by mouth daily. 10/30/14 01/27/16  Darci Current, MD  atorvastatin (LIPITOR) 40 MG tablet Take 40 mg by mouth every evening.    [provider]  cloNIDine (CATAPRES) 0.2 MG tablet Take 1 tablet (0.2 mg total) by mouth 2 (two) times daily. 10/30/14   Darci Current, MD  insulin glargine (LANTUS) 100 unit/mL SOPN Inject 20 Units into the skin 2 (two) times daily.    [provider]  lidocaine (LIDODERM) 5 % Place 1 patch onto the skin every 12 (twelve) hours. Remove & Discard patch within 12 hours or as directed by MD 08/05/20 08/05/21  Joni Reining, PA-C  metFORMIN (GLUCOPHAGE) 1000 MG tablet Take 1 tablet (1,000 mg total) by mouth 2 (two) times daily. Patient taking differently: Take 1,000 mg by mouth daily.  10/20/11  Antonieta Iba, MD  pantoprazole (PROTONIX) 40 MG tablet Take 1 tablet (40 mg total) by mouth 2 (two) times daily. 03/10/15   Enedina Finner, MD  traMADol (ULTRAM) 50 MG tablet Take 1 tablet (50 mg total) by mouth every 12 (twelve) hours as needed. 08/05/20   Joni Reining, PA-C    Allergies Patient has no known allergies.  Family History  Problem Relation Age of Onset  . Heart attack Mother     Social History Social History   Tobacco Use  . Smoking status: Never Smoker  . Smokeless tobacco: Never Used  Substance Use Topics  . Alcohol use: No  . Drug use: Not Currently    Review of  Systems Constitutional: No fever/chills Eyes: No visual changes. ENT: No sore throat. Cardiovascular: Possible syncopal episode. denies chest pain. Respiratory: Denies shortness of breath. Gastrointestinal: No abdominal pain.  No nausea, no vomiting.  No diarrhea.  No constipation. Genitourinary: Negative for dysuria. Musculoskeletal: Left side lower lateral chest wall pain, present previously and continues to hurt now.  Negative for neck pain.  Negative for back pain. Integumentary: Negative for rash. Neurological: Negative for headaches, focal weakness or numbness.   ____________________________________________   PHYSICAL EXAM:  VITAL SIGNS: ED Triage Vitals  Enc Vitals Group     BP 08/06/20 0026 (!) 179/100     Pulse Rate 08/06/20 0026 81     Resp 08/06/20 0026 14     Temp 08/06/20 0026 98.8 F (37.1 C)     Temp Source 08/06/20 0026 Oral     SpO2 08/06/20 0026 98 %     Weight --      Height --      Head Circumference --      Peak Flow --      Pain Score 08/06/20 0027 9     Pain Loc --      Pain Edu? --      Excl. in GC? --     Constitutional: Alert and oriented, minimally cooperative but does not seem to be in distress. Eyes: Conjunctivae are normal.  Head: Atraumatic, no evidence of contusion or hematoma. Nose: No congestion/rhinnorhea. Mouth/Throat: Patient is wearing a mask. Neck: No stridor.  No meningeal signs.   Cardiovascular: Normal rate, regular rhythm. Good peripheral circulation. Respiratory: Normal respiratory effort.  No retractions. Gastrointestinal: Soft and nontender. No distention.  Musculoskeletal: Tenderness to palpation of the left lower lateral rib cage without any specific point tenderness.  No other gross deformities or abnormalities identified on musculoskeletal exam. Neurologic:  Normal speech and language. No gross focal neurologic deficits are appreciated.  Skin:  Skin is warm, dry and intact. Psychiatric: Mood and affect are somewhat  flat and blunted, withdrawn, but will communicate when asked direct questions.  ____________________________________________   LABS (all labs ordered are listed, but only abnormal results are displayed)  Labs Reviewed  URINALYSIS, COMPLETE (UACMP) WITH MICROSCOPIC - Abnormal; Notable for the following components:      Result Value   Color, Urine STRAW (*)    APPearance CLEAR (*)    Glucose, UA >=500 (*)    Hgb urine dipstick SMALL (*)    Ketones, ur 5 (*)    Protein, ur 30 (*)    All other components within normal limits  URINE DRUG SCREEN, QUALITATIVE ([facility] ONLY) - Abnormal; Notable for the following components:   Cocaine Metabolite,Ur [place] POSITIVE (*)    All other components within normal limits  COMPREHENSIVE METABOLIC PANEL -  Abnormal; Notable for the following components:   Sodium 129 (*)    Chloride 97 (*)    CO2 21 (*)    Glucose, Bld 407 (*)    Total Bilirubin 1.5 (*)    All other components within normal limits  CBG MONITORING, ED - Abnormal; Notable for the following components:   Glucose-Capillary 382 (*)    All other components within normal limits  TROPONIN I (HIGH SENSITIVITY) - Abnormal; Notable for the following components:   Troponin I (High Sensitivity) 22 (*)    All other components within normal limits  TROPONIN I (HIGH SENSITIVITY) - Abnormal; Notable for the following components:   Troponin I (High Sensitivity) 24 (*)    All other components within normal limits  RESP PANEL BY RT-PCR (FLU A&B, COVID) ARPGX2  CBC WITH DIFFERENTIAL/PLATELET  ETHANOL  LIPASE, BLOOD  CBC  HIV ANTIBODY (ROUTINE TESTING W REFLEX)  BASIC METABOLIC PANEL  TSH  HEMOGLOBIN A1C  TROPONIN I (HIGH SENSITIVITY)   ____________________________________________  EKG  ED ECG REPORT I, Loleta Rose, the attending physician, personally viewed and interpreted this ECG.  Date: 08/06/2020 EKG Time: 00: 26 Rate: 81 Rhythm: Sinus rhythm with first-degree AV block QRS Axis:  normal Intervals: PR interval of 215 ms, otherwise unremarkable ST/T Wave abnormalities: Non-specific ST segment / T-wave changes, but no clear evidence of acute ischemia. Narrative Interpretation: no definitive evidence of acute ischemia; does not meet STEMI criteria.   ____________________________________________  RADIOLOGY I, Loleta Rose, personally viewed and evaluated these images (plain radiographs) as part of my medical decision making, as well as reviewing the written report by the radiologist.  ED MD interpretation: No acute abnormalities identified on x-rays nor head/C-spine CTs.  Official radiology report(s): DG Ribs Unilateral W/Chest Left  Result Date: 08/06/2020 CLINICAL DATA:  more pain after fall tonight in same area as previous pain EXAM: LEFT RIBS AND CHEST - 3+ VIEW COMPARISON:  Aug 05, 2020. FINDINGS: No displaced fracture or other bone lesions are seen involving the ribs. There is no evidence of pneumothorax or pleural effusion. Both lungs are clear. Heart size and mediastinal contours are within normal limits. IMPRESSION: No acute findings. Electronically Signed   By: Maudry Mayhew MD   On: 08/06/2020 02:43   DG Ribs Unilateral W/Chest Left  Result Date: 08/05/2020 CLINICAL DATA:  Fall, left lateral rib pain, initial encounter. EXAM: LEFT RIBS AND CHEST - 3+ VIEW COMPARISON:  Chest radiograph 10/12/2019. FINDINGS: Trachea is midline. Heart size stable. Lungs are clear. No pleural fluid. Dedicated views of the left ribs show no definite acute fracture. IMPRESSION: No acute findings. Electronically Signed   By: Leanna Battles M.D.   On: 08/05/2020 10:37   CT Head Wo Contrast  Result Date: 08/06/2020 CLINICAL DATA:  Fall EXAM: CT HEAD WITHOUT CONTRAST CT CERVICAL SPINE WITHOUT CONTRAST TECHNIQUE: Multidetector CT imaging of the head and cervical spine was performed following the standard protocol without intravenous contrast. Multiplanar CT image reconstructions of the  cervical spine were also generated. COMPARISON:  None. FINDINGS: CT HEAD FINDINGS Brain: There is no mass, hemorrhage or extra-axial collection. The size and configuration of the ventricles and extra-axial CSF spaces are normal. The brain parenchyma is normal, without evidence of acute or chronic infarction. Vascular: No abnormal hyperdensity of the major intracranial arteries or dural venous sinuses. No intracranial atherosclerosis. Skull: The visualized skull base, calvarium and extracranial soft tissues are normal. Sinuses/Orbits: No fluid levels or advanced mucosal thickening of the visualized paranasal sinuses.  No mastoid or middle ear effusion. The orbits are normal. CT CERVICAL SPINE FINDINGS Alignment: No static subluxation. Facets are aligned. Occipital condyles are normally positioned. Skull base and vertebrae: No acute fracture. Soft tissues and spinal canal: No prevertebral fluid or swelling. No visible canal hematoma. Disc levels: No advanced spinal canal or neural foraminal stenosis. Upper chest: No pneumothorax, pulmonary nodule or pleural effusion. Other: Normal visualized paraspinal cervical soft tissues. IMPRESSION: 1. No acute intracranial abnormality. 2. No acute fracture or static subluxation of the cervical spine. Electronically Signed   By: Deatra RobinsonKevin  Herman M.D.   On: 08/06/2020 02:31   CT Cervical Spine Wo Contrast  Result Date: 08/06/2020 CLINICAL DATA:  Fall EXAM: CT HEAD WITHOUT CONTRAST CT CERVICAL SPINE WITHOUT CONTRAST TECHNIQUE: Multidetector CT imaging of the head and cervical spine was performed following the standard protocol without intravenous contrast. Multiplanar CT image reconstructions of the cervical spine were also generated. COMPARISON:  None. FINDINGS: CT HEAD FINDINGS Brain: There is no mass, hemorrhage or extra-axial collection. The size and configuration of the ventricles and extra-axial CSF spaces are normal. The brain parenchyma is normal, without evidence of acute  or chronic infarction. Vascular: No abnormal hyperdensity of the major intracranial arteries or dural venous sinuses. No intracranial atherosclerosis. Skull: The visualized skull base, calvarium and extracranial soft tissues are normal. Sinuses/Orbits: No fluid levels or advanced mucosal thickening of the visualized paranasal sinuses. No mastoid or middle ear effusion. The orbits are normal. CT CERVICAL SPINE FINDINGS Alignment: No static subluxation. Facets are aligned. Occipital condyles are normally positioned. Skull base and vertebrae: No acute fracture. Soft tissues and spinal canal: No prevertebral fluid or swelling. No visible canal hematoma. Disc levels: No advanced spinal canal or neural foraminal stenosis. Upper chest: No pneumothorax, pulmonary nodule or pleural effusion. Other: Normal visualized paraspinal cervical soft tissues. IMPRESSION: 1. No acute intracranial abnormality. 2. No acute fracture or static subluxation of the cervical spine. Electronically Signed   By: Deatra RobinsonKevin  Herman M.D.   On: 08/06/2020 02:31    ____________________________________________   PROCEDURES   Procedure(s) performed (including Critical Care):  .1-3 Lead EKG Interpretation Performed by: Loleta RoseForbach, Shalon Councilman, MD Authorized by: Loleta RoseForbach, Shaia Porath, MD     Interpretation: normal     ECG rate:  75   ECG rate assessment: normal     Rhythm: sinus rhythm     Ectopy: none     Conduction: normal       ____________________________________________   INITIAL IMPRESSION / MDM / ASSESSMENT AND PLAN / ED COURSE  As part of my medical decision making, I reviewed the following data within the electronic MEDICAL RECORD NUMBER Nursing notes reviewed and incorporated, Labs reviewed , EKG interpreted , Old chart reviewed, Radiograph reviewed , Discussed with admitting physician  and Notes from prior ED visits   Differential diagnosis includes, but is not limited to, vasovagal episode, orthostatic hypotension, nonspecific syncope,  cardiac arrhythmia, seizure disorder, electrolyte or metabolic abnormality, acute infection, pneumothorax, PE, ACS, medication or drug side effect.  The patient is on the cardiac monitor to evaluate for evidence of arrhythmia and/or significant heart rate changes.  EKG unremarkable.  Vital signs stable and within normal limits other than some mild hypertension.  Patient does not appear to be in distress and the pain in his left rib cage seems to be chronic, but given the unwitnessed fall and that it is the only symptom he is reporting, I will repeat the chest and rib x-rays to see if there is  a change from the images obtained within the last 24 hours.  I will evaluate broadly with lab work to try to identify any abnormality that would explain his incident.  Patient is agreeable to be allowed to rest while the work-up is pending.  Given the unwitnessed fall and unclear circumstances as well as the possibility of a seizure, I ordered a CT head and CT C-spine.     Clinical Course as of 08/06/20 0534  Thu Aug 06, 2020  0307 Urinalysis, Complete w Microscopic(!) Unremarkable urinalysis other than glucosuria [CF]  0307 Comprehensive metabolic panel(!) Comprehensive metabolic panel most notable for hyperglycemia but a normal anion gap.  I ordered 500 mL normal saline to go along with the approximately 400 mL of normal saline he received prior to arrival by EMS. [CF]  0308 Troponin I (High Sensitivity)(!): 22 Slight troponin elevation at 22, unclear clinical significance given no chest pain and a reassuring EKG.  We will repeat at the 2-hour mark. [CF]  0308 CBC with Differential/Platelet CBC normal, no leukocytosis nor anemia [CF]  0340 Cocaine Metabolite,Ur [place](!): POSITIVE [CF]  0357 The patient's repeat troponin is 24 which is up very slightly from the original 22.  However he has no history that I can find of elevated troponins and even no there is no significant delta, it is elevated to the point that  the high-sensitivity troponin algorithm suggests observation/admission.  Additionally, he had this reported syncope and collapse episode with a transient decreased mental status and it is unclear if this was due to a cardiac arrhythmia, seizure, or other issue.  His cocaine abuse also suggest possible cardiac complications.  I am consulting the hospitalist for admission for syncope work-up and further cardiac observation. [CF]  0404 Discussed case with hospitalist, he agreed with plan [CF]  [phone] (delayed documentation) I personally reviewed the patient's imaging and agree with the radiologist's interpretation that there is no evidence of an acute or emergent abnormality or injury identified on the plain films of the chest and ribs. [CF]    Clinical Course User Index [CF] Loleta Rose, MD     ____________________________________________  FINAL CLINICAL IMPRESSION(S) / ED DIAGNOSES  Final diagnoses:  Syncope and collapse  Transient alteration of awareness  Cocaine abuse (HCC)  Elevated troponin level     MEDICATIONS GIVEN DURING THIS VISIT:  Medications  amLODipine (NORVASC) tablet 10 mg (has no administration in time range)  atorvastatin (LIPITOR) tablet 40 mg (has no administration in time range)  cloNIDine (CATAPRES) tablet 0.2 mg (has no administration in time range)  insulin glargine (LANTUS) Solostar Pen 20 Units (has no administration in time range)  pantoprazole (PROTONIX) EC tablet 40 mg (has no administration in time range)  lidocaine (LIDODERM) 5 % 1 patch (has no administration in time range)  enoxaparin (LOVENOX) injection 40 mg (has no administration in time range)  0.9 %  sodium chloride infusion (has no administration in time range)  acetaminophen (TYLENOL) tablet 650 mg (has no administration in time range)    Or  acetaminophen (TYLENOL) suppository 650 mg (has no administration in time range)  traZODone (DESYREL) tablet 25 mg (has no administration in time range)   magnesium hydroxide (MILK OF MAGNESIA) suspension 30 mL (has no administration in time range)  ondansetron (ZOFRAN) tablet 4 mg (has no administration in time range)    Or  ondansetron (ZOFRAN) injection 4 mg (has no administration in time range)  insulin aspart (novoLOG) injection 0-20 Units (has no administration in time  range)  labetalol (NORMODYNE) injection 20 mg (has no administration in time range)  sodium chloride 0.9 % bolus 500 mL (0 mLs Intravenous Stopped 08/06/20 0453)     ED Discharge Orders    None      *Please note:  Steve Alvarado was evaluated in Emergency Department on 08/06/2020 for the symptoms described in the history of present illness. He was evaluated in the context of the global COVID-19 pandemic, which necessitated consideration that the patient might be at risk for infection with the SARS-CoV-2 virus that causes COVID-19. Institutional protocols and algorithms that pertain to the evaluation of patients at risk for COVID-19 are in a state of rapid change based on information released by regulatory bodies including the CDC and federal and state organizations. These policies and algorithms were followed during the patient's care in the ED.  Some ED evaluations and interventions may be delayed as a result of limited staffing during and after the pandemic.*  Note:  This document was prepared using Dragon voice recognition software and may include unintentional dictation errors.   Loleta Rose, MD 08/06/20 [phone]

## 2020-08-06 NOTE — ED Notes (Signed)
Pt states he fell, hit his head, and reports of losing consciousness but denies any complaints. Pt reports the only thing hurting him right now is his ribs.

## 2020-08-06 NOTE — Progress Notes (Signed)
PROGRESS NOTE    Steve Alvarado  LYY:[id number] DOB: January 04, 1956 DOA: 08/06/2020 PCP: Ellery Plunk, MD   Assessment & Plan:   Active Problems:   Syncope   Syncope: etiology unclear. Troponins are minimally elevated. Carotid US is pending. Echo was ordered.  Hypertensive urgency: resolved  HTN: continue on home dose of amlodipine, clonidine. IV labetalol prn   Hyponatremia: trending up today. Continue on IVFs  DM2: uncontrolled. Continue on lantus, SSI w/ accuchecks   HLD: continue on statin   GERD: continue on PPI  COVID19 infection: asymptomatic. Not requiring oxygen currently. Vitals are stable. Will hold off starting any treatment currently. CXR shows no acute findings. Continue airborne & contact precautions. Unvaccinated    DVT prophylaxis: lovenox Code Status: full Family Communication:  Disposition Plan: likely d/c back home .  Level of care: Med-Surg   Status is: Observation  The patient remains OBS appropriate and will d/c before 2 midnights.  Dispo: The patient is from: Home              Anticipated d/c is to: Home              Patient currently is not medically stable to d/c.   Difficult to place patient Yes       Consultants:      Procedures:   Antimicrobials:    Subjective: Pt c/o fatigue   Objective: Vitals:   08/06/20 0600 08/06/20 0630 08/06/20 0700 08/06/20 0730  BP: 122/81 126/79 100/68 (!) 120/96  Pulse: 73 73 71 75  Resp: 15 15 14 15   Temp:      TempSrc:      SpO2: 97% 92% 95% 96%   No intake or output data in the 24 hours ending 08/06/20 0832 There were no vitals filed for this visit.  Examination:  General exam: Appears calm and comfortable  Respiratory system: Clear to auscultation. Respiratory effort normal. Cardiovascular system: S1 & S2+. No rubs, gallops or clicks Gastrointestinal system: Abdomen is nondistended, soft and nontender. Normal bowel sounds heard. Central nervous system: Alert and  oriented. Moves all extremities  Psychiatry: Judgement and insight appear normal. Flat mood and affect    Data Reviewed: I have personally reviewed following labs and imaging studies  CBC: Recent Labs  Lab 08/06/20 0035 08/06/20 0452  WBC 7.1 7.2  NEUTROABS 4.9  --   HGB 14.4 14.3  HCT 40.0 40.1  MCV 83.9 84.8  PLT 219 227   Basic Metabolic Panel: Recent Labs  Lab 08/06/20 0141 08/06/20 0452  NA 129* 131*  K 4.5 4.3  CL 97* 99  CO2 21* 22  GLUCOSE 407* 373*  BUN 21 20  CREATININE 1.16 1.06  CALCIUM 9.2 8.9   GFR: Estimated Creatinine Clearance: 79.7 mL/min (by C-G formula based on SCr of 1.06 mg/dL). Liver Function Tests: Recent Labs  Lab 08/06/20 0141  AST 27  ALT 22  ALKPHOS 73  BILITOT 1.5*  PROT 7.7  ALBUMIN 3.8   Recent Labs  Lab 08/06/20 0141  LIPASE 33   No results for input(s): AMMONIA in the last 168 hours. Coagulation Profile: No results for input(s): INR, PROTIME in the last 168 hours. Cardiac Enzymes: No results for input(s): CKTOTAL, CKMB, CKMBINDEX, TROPONINI in the last 168 hours. BNP (last 3 results) No results for input(s): PROBNP in the last 8760 hours. HbA1C: No results for input(s): HGBA1C in the last 72 hours. CBG: Recent Labs  Lab 08/06/20 0116 08/06/20 0732  GLUCAP 382*  325*   Lipid Profile: No results for input(s): CHOL, HDL, LDLCALC, TRIG, CHOLHDL, LDLDIRECT in the last 72 hours. Thyroid Function Tests: Recent Labs    08/06/20 0452  TSH 6.004*   Anemia Panel: No results for input(s): VITAMINB12, FOLATE, FERRITIN, TIBC, IRON, RETICCTPCT in the last 72 hours. Sepsis Labs: No results for input(s): PROCALCITON, LATICACIDVEN in the last 168 hours.  Recent Results (from the past 240 hour(s))  Resp Panel by RT-PCR (Flu A&B, Covid) Nasopharyngeal Swab     Status: Abnormal   Collection Time: 08/06/20  4:52 AM   Specimen: Nasopharyngeal Swab; Nasopharyngeal(NP) swabs in vial transport medium  Result Value Ref Range  Status   SARS Coronavirus 2 by RT PCR POSITIVE (A) NEGATIVE Final    Comment: RESULT CALLED TO, READ BACK BY AND VERIFIED WITH: EMILY GORMAN@0700  ON 08/06/20 BY HKP (NOTE) SARS-CoV-2 target nucleic acids are NOT DETECTED.  The SARS-CoV-2 RNA is generally detectable in upper respiratory specimens during the acute phase of infection. The lowest concentration of SARS-CoV-2 viral copies this assay can detect is 138 copies/mL. A negative result does not preclude SARS-Cov-2 infection and should not be used as the sole basis for treatment or other patient management decisions. A negative result may occur with  improper specimen collection/handling, submission of specimen other than nasopharyngeal swab, presence of viral mutation(s) within the areas targeted by this assay, and inadequate number of viral copies(<138 copies/mL). A negative result must be combined with clinical observations, patient history, and epidemiological information. The expected result is Negative.  Fact Sheet for Patients:  BloggerCourse.com  Fact Sheet  for Healthcare Providers:  SeriousBroker.it  This test is not yet approved or cleared by the [place] FDA and  has been authorized for detection and/or diagnosis of SARS-CoV-2 by FDA under an Emergency Use Authorization (EUA). This EUA will remain  in effect (meaning this test can be used) for the duration of the COVID-19 declaration under Section 564(b)(1) of the Act, 21 U.S.C.section 360bbb-3(b)(1), unless the authorization is terminated  or revoked sooner.       Influenza A by PCR NEGATIVE NEGATIVE Final   Influenza B by PCR NEGATIVE NEGATIVE Final    Comment: (NOTE) The Xpert Xpress SARS-CoV-2/FLU/RSV plus assay is intended as an aid in the diagnosis of influenza from Nasopharyngeal swab specimens and should not be used as a sole basis for treatment. Nasal washings and aspirates are unacceptable for  Xpert Xpress SARS-CoV-2/FLU/RSV testing.  Fact Sheet for Patients: BloggerCourse.com  Fact Sheet for Healthcare Providers: SeriousBroker.it  This test is not yet approved or cleared by the [place] FDA and has been authorized for detection and/or diagnosis of SARS-CoV-2 by FDA under an Emergency Use Authorization (EUA). This EUA will remain in effect (meaning this test can be used) for the duration of the COVID-19 declaration under Section 564(b)(1) of the Act, 21 U.S.C. section 360bbb-3(b)(1), unless the authorization is terminated or revoked.  Performed at [facility], [address], [place], [place] [address]          Radiology Studies: DG Ribs Unilateral W/Chest Left  Result Date: 08/06/2020 CLINICAL DATA:  more pain after fall tonight in same area as previous pain EXAM: LEFT RIBS AND CHEST - 3+ VIEW COMPARISON:  Aug 05, 2020. FINDINGS: No displaced fracture or other bone lesions are seen involving the ribs. There is no evidence of pneumothorax or pleural effusion. Both lungs are clear. Heart size and mediastinal contours are within normal limits. IMPRESSION: No acute findings.  Electronically Signed   By: Maudry MayhewJeffrey  Waltz MD   On: 08/06/2020 02:43   DG Ribs Unilateral W/Chest Left  Result Date: 08/05/2020 CLINICAL DATA:  Fall, left lateral rib pain, initial encounter. EXAM: LEFT RIBS AND CHEST - 3+ VIEW COMPARISON:  Chest radiograph 10/12/2019. FINDINGS: Trachea is midline. Heart size stable. Lungs are clear. No pleural fluid. Dedicated views of the left ribs show no definite acute fracture. IMPRESSION: No acute findings. Electronically Signed   By: Leanna BattlesMelinda  Blietz M.D.   On: 08/05/2020 10:37   CT Head Wo Contrast  Result Date: 08/06/2020 CLINICAL DATA:  Fall EXAM: CT HEAD WITHOUT CONTRAST CT CERVICAL SPINE WITHOUT CONTRAST TECHNIQUE: Multidetector CT imaging of the head and cervical spine was performed following  the standard protocol without intravenous contrast. Multiplanar CT image reconstructions of the cervical spine were also generated. COMPARISON:  None. FINDINGS: CT HEAD FINDINGS Brain: There is no mass, hemorrhage or extra-axial collection. The size and configuration of the ventricles and extra-axial CSF spaces are normal. The brain parenchyma is normal, without evidence of acute or chronic infarction. Vascular: No abnormal hyperdensity of the major intracranial arteries or dural venous sinuses. No intracranial atherosclerosis. Skull: The visualized skull base, calvarium and extracranial soft tissues are normal. Sinuses/Orbits: No fluid levels or advanced mucosal thickening of the visualized paranasal sinuses. No mastoid or middle ear effusion. The orbits are normal. CT CERVICAL SPINE FINDINGS Alignment: No static subluxation. Facets are aligned. Occipital condyles are normally positioned. Skull base and vertebrae: No acute fracture. Soft tissues and spinal canal: No prevertebral fluid or swelling. No visible canal hematoma. Disc levels: No advanced spinal canal or neural foraminal stenosis. Upper chest: No pneumothorax, pulmonary nodule or pleural effusion. Other: Normal visualized paraspinal cervical soft tissues. IMPRESSION: 1. No acute intracranial abnormality. 2. No acute fracture or static subluxation of the cervical spine. Electronically Signed   By: Deatra RobinsonKevin  Herman M.D.   On: 08/06/2020 02:31   CT Cervical Spine Wo Contrast  Result Date: 08/06/2020 CLINICAL DATA:  Fall EXAM: CT HEAD WITHOUT CONTRAST CT CERVICAL SPINE WITHOUT CONTRAST TECHNIQUE: Multidetector CT imaging of the head and cervical spine was performed following the standard protocol without intravenous contrast. Multiplanar CT image reconstructions of the cervical spine were also generated. COMPARISON:  None. FINDINGS: CT HEAD FINDINGS Brain: There is no mass, hemorrhage or extra-axial collection. The size and configuration of the ventricles  and extra-axial CSF spaces are normal. The brain parenchyma is normal, without evidence of acute or chronic infarction. Vascular: No abnormal hyperdensity of the major intracranial arteries or dural venous sinuses. No intracranial atherosclerosis. Skull: The visualized skull base, calvarium and extracranial soft tissues are normal. Sinuses/Orbits: No fluid levels or advanced mucosal thickening of the visualized paranasal sinuses. No mastoid or middle ear effusion. The orbits are normal. CT CERVICAL SPINE FINDINGS Alignment: No static subluxation. Facets are aligned. Occipital condyles are normally positioned. Skull base and vertebrae: No acute fracture. Soft tissues and spinal canal: No prevertebral fluid or swelling. No visible canal hematoma. Disc levels: No advanced spinal canal or neural foraminal stenosis. Upper chest: No pneumothorax, pulmonary nodule or pleural effusion. Other: Normal visualized paraspinal cervical soft tissues. IMPRESSION: 1. No acute intracranial abnormality. 2. No acute fracture or static subluxation of the cervical spine. Electronically Signed   By: Deatra RobinsonKevin  Herman M.D.   On: 08/06/2020 02:31        Scheduled Meds: . amLODipine  10 mg Oral Daily  . atorvastatin  40 mg Oral QPM  . cloNIDine  0.2 mg Oral BID  . enoxaparin (LOVENOX) injection  40 mg Subcutaneous Q24H  . insulin aspart  0-20 Units Subcutaneous TID AC & HS  . insulin glargine  20 Units Subcutaneous BID  . lidocaine  1 patch Transdermal Q12H  . pantoprazole  40 mg Oral BID   Continuous Infusions: . sodium chloride 100 mL/hr at 08/06/20 0551     LOS: 0 days    Time spent: 32 mins    Charise Killian, MD [facility] Pager [phone]  If 7PM-7AM, please contact night-coverage 08/06/2020, 8:32 AM

## 2020-08-06 NOTE — ED Notes (Signed)
Pt assisted with breakfast tray

## 2020-08-06 NOTE — ED Notes (Signed)
Pt given his dinner tray

## 2020-08-07 ENCOUNTER — Observation Stay
Admit: 2020-08-07 | Discharge: 2020-08-07 | Disposition: A | Discharge status: Home / Self Care | Payer: Medicare Other | Attending: Family Medicine | Admitting: Family Medicine

## 2020-08-07 DIAGNOSIS — F3289 Other specified depressive episodes: Secondary | ICD-10-CM | POA: Diagnosis not present

## 2020-08-07 DIAGNOSIS — R55 Syncope and collapse: Secondary | ICD-10-CM | POA: Diagnosis not present

## 2020-08-07 DIAGNOSIS — I10 Essential (primary) hypertension: Secondary | ICD-10-CM

## 2020-08-07 LAB — CBC
HCT: 35.4 % — ABNORMAL LOW (ref 39.0–52.0)
Hemoglobin: 12.6 g/dL — ABNORMAL LOW (ref 13.0–17.0)
MCH: 30.4 pg (ref 26.0–34.0)
MCHC: 35.6 g/dL (ref 30.0–36.0)
MCV: 85.3 fL (ref 80.0–100.0)
Platelets: 205 10*3/uL (ref 150–400)
RBC: 4.15 MIL/uL — ABNORMAL LOW (ref 4.22–5.81)
RDW: 12.2 % (ref 11.5–15.5)
WBC: 5.1 10*3/uL (ref 4.0–10.5)
nRBC: 0 % (ref 0.0–0.2)

## 2020-08-07 LAB — BASIC METABOLIC PANEL
Anion gap: 8 (ref 5–15)
BUN: 32 mg/dL — ABNORMAL HIGH (ref 8–23)
CO2: 23 mmol/L (ref 22–32)
Calcium: 8.6 mg/dL — ABNORMAL LOW (ref 8.9–10.3)
Chloride: 102 mmol/L (ref 98–111)
Creatinine, Ser: 1.48 mg/dL — ABNORMAL HIGH (ref 0.61–1.24)
GFR, Estimated: 52 mL/min — ABNORMAL LOW (ref >60)
Glucose, Bld: 230 mg/dL — ABNORMAL HIGH (ref 70–99)
Potassium: 3.9 mmol/L (ref 3.5–5.1)
Sodium: 133 mmol/L — ABNORMAL LOW (ref 135–145)

## 2020-08-07 LAB — GLUCOSE, CAPILLARY
Glucose-Capillary: 230 mg/dL — ABNORMAL HIGH (ref 70–99)
Glucose-Capillary: 314 mg/dL — ABNORMAL HIGH (ref 70–99)
Glucose-Capillary: 145 mg/dL — ABNORMAL HIGH (ref 70–99)
Glucose-Capillary: 132 mg/dL — ABNORMAL HIGH (ref 70–99)

## 2020-08-07 LAB — TROPONIN I (HIGH SENSITIVITY): Troponin I (High Sensitivity): 26 ng/L — ABNORMAL HIGH (ref <18)

## 2020-08-07 MED ORDER — INSULIN ASPART 100 UNIT/ML IJ SOLN
4.0000 [IU] | Freq: Three times a day (TID) | INTRAMUSCULAR | Status: DC
  Administered 2020-08-07 – 2020-08-09 (×6): 4 [IU] via SUBCUTANEOUS
  Filled 2020-08-07 (×6): qty 1

## 2020-08-07 MED ORDER — INSULIN ASPART 100 UNIT/ML IJ SOLN: 0.0000 [IU] | Freq: Once | INTRAMUSCULAR | Status: DC

## 2020-08-07 MED ORDER — CITALOPRAM HYDROBROMIDE 20 MG PO TABS
20.0000 mg | ORAL_TABLET | Freq: Every day | ORAL | Status: DC
  Administered 2020-08-07 – 2020-08-10 (×4): 20 mg via ORAL
  Filled 2020-08-07 (×4): qty 1

## 2020-08-07 MED ORDER — INSULIN ASPART 100 UNIT/ML IJ SOLN: 0.0000 [IU] | Freq: Three times a day (TID) | INTRAMUSCULAR | Status: DC

## 2020-08-07 NOTE — Progress Notes (Signed)
VS taken at 2009

## 2020-08-07 NOTE — Progress Notes (Signed)
Inpatient Diabetes Program Recommendations  AACE/ADA: New Consensus Statement on Inpatient Glycemic Control (2015)  Target Ranges:  Prepandial:   less than 140 mg/dL      Peak postprandial:   less than 180 mg/dL (1-2 hours)      Critically ill patients:  140 - 180 mg/dL   Lab Results  Component Value Date   GLUCAP 314 (H) 08/07/2020   HGBA1C 13.9 (H) 08/06/2020    Review of Glycemic Control Results for Steve Alvarado, Steve Alvarado (MRN [id number]) as of 08/07/2020 12:19  Ref. Range 08/06/2020 12:34 08/06/2020 18:06 08/06/2020 22:15 08/07/2020 07:53 08/07/2020 11:15  Glucose-Capillary Latest Ref Range: 70 - 99 mg/dL 151 (H) 834 (H) 373 (H) 230 (H) 314 (H)   Diabetes history: DM 2 Outpatient Diabetes medications:  Lantus 20 units bid Current orders for Inpatient glycemic control:  Novolog resistant tid with meals and HS Lantus 20 units bid  Inpatient Diabetes Program Recommendations:    Spoke with patient by phone.  He admits that he has not been taking his medications as ordered for the past month or so.  He has been living in shelter at [facility] and is concerned about his belongings which he left there when EMS was called.  His wife and he are recently separated and he is very tearful.  Discussed importance of controlling blood sugars and recent A1C of 13.9%.  He states he has diabetes medication but will run out soon.  Notified Child psychotherapist of patient's concerns as well.   -Consider adding Novolog meal coverage 4 units tid with meals.    Thanks  Beryl Meager, RN, BC-ADM Inpatient Diabetes Coordinator Pager [phone] (8a-5p)

## 2020-08-07 NOTE — TOC Initial Note (Signed)
Transition of Care [facility]) - Initial/Assessment Note    Patient Details  Name: Steve Alvarado MRN: [id number] Date of Birth: Nov 13, 1955  Transition of Care [facility]) CM/SW Contact:    Margarito Liner, LCSW Phone Number: 08/07/2020, 1:18 PM  Clinical Narrative:  Patient on COVID isolation precautions so called him in the room, introduced role, and explained that discharge planning would be discussed. Patient stated he had been staying at the [facility] for a couple of days before admission. He was homeless prior to that. Patient would like to return there at discharge. He gave CSW permission to see what the facility's COVID requirements are, how many days from positive test they would allow him to return. Patient's belongings are still there. CSW left voicemail for Librarian, academic, Almira Bar.                Expected Discharge Plan: Homeless Shelter Barriers to Discharge: Continued Medical Work up   Patient Goals and CMS Choice        Expected Discharge Plan and Services Expected Discharge Plan: Homeless Shelter     Post Acute Care Choice: Resumption of Svcs/PTA Provider Living arrangements for the past 2 months: Homeless Shelter,Homeless                                      Prior Living Arrangements/Services Living arrangements for the past 2 months: Homeless Shelter,Homeless Lives with:: Facility Resident Patient language and need for interpreter reviewed:: Yes Do you feel safe going back to the place where you live?: Yes      Need for Family Participation in Patient Care: Yes (Comment) Care giver support system in place?: No (comment)   Criminal Activity/Legal Involvement Pertinent to Current Situation/Hospitalization: No - Comment as needed  Activities of Daily Living Home Assistive Devices/Equipment: Cane (specify quad or straight) ADL Screening (condition at time of admission) Patient's cognitive ability adequate to safely complete daily  activities?: Yes Is the patient deaf or have difficulty hearing?: No Does the patient have difficulty seeing, even when wearing glasses/contacts?: No Does the patient have difficulty concentrating, remembering, or making decisions?: No Patient able to express need for assistance with ADLs?: Yes Does the patient have difficulty dressing or bathing?: No Independently performs ADLs?: Yes (appropriate for developmental age) Does the patient have difficulty walking or climbing stairs?: Yes Weakness of Legs: Both Weakness of Arms/Hands: Both (hands)  Permission Sought/Granted Permission sought to share information with : Oceanographer granted to share information with : Yes, Verbal Permission Granted     Permission granted to share info w AGENCY: [facility]        Emotional Assessment Appearance:: Appears stated age Attitude/Demeanor/Rapport: Engaged,Gracious Affect (typically observed): Accepting,Appropriate,Calm,Pleasant Orientation: : Oriented to Self,Oriented to Place,Oriented to  Time,Oriented to Situation Alcohol / Substance Use: Not Applicable Psych Involvement: No (comment)  Admission diagnosis:  Transient alteration of awareness [R40.4] Syncope and collapse [R55] Syncope [R55] Cocaine abuse (HCC) [F14.10] Elevated troponin level [R77.8] Patient Active Problem List   Diagnosis Date Noted  . Syncope 08/06/2020  . Intractable abdominal pain 03/08/2015  . Hypertensive urgency 03/08/2015  . Intractable nausea and vomiting 03/07/2015  . HTN (hypertension) 02/14/2011  . CAD (coronary artery disease) 08/13/2010  . Stented coronary artery 08/13/2010  . Diabetes mellitus (HCC) 08/13/2010  . Hyperlipemia 08/13/2010   PCP:  Ellery Plunk, MD Pharmacy:   [facility]  [address] [place] - [address]) [place] [address] Phone: [phone] Fax:  [phone]     Social Determinants of Health (SDOH) Interventions    Readmission Risk Interventions No flowsheet data found.

## 2020-08-07 NOTE — Progress Notes (Signed)
*  PRELIMINARY RESULTS* Echocardiogram 2D Echocardiogram has been performed.  Cristela Blue 08/07/2020, 1:53 PM

## 2020-08-07 NOTE — Progress Notes (Signed)
OT Cancellation Note  Patient Details Name: GILBERT MANOLIS MRN: [id number] DOB: [birth year]   Cancelled Treatment:    Reason Eval/Treat Not Completed: Fatigue/lethargy limiting ability to participate. Pt fatigued, will re-attempt OT eval next date.  Wynona Canes, MPH, MS, OTR/L ascom [phone] 08/07/20, 3:59 PM

## 2020-08-07 NOTE — Evaluation (Signed)
Physical Therapy Evaluation Patient Details Name: Steve Alvarado MRN: [id number] DOB: 12/08/1955 Today's Date: 08/07/2020   History of Present Illness  Steve Alvarado is a 65yoM who comes to [facility] on 5/18 after unwitneessed fall [facility]. Pt hit head upon falling. Brought in via EMS c [facility]. Pt tested (+) for COVID.  PMH: DM, CAD, HTN, hypoTSH.  Clinical Impression  Pt admitted with above diagnosis. Pt currently with functional limitations due to the deficits listed below (see "PT Problem List"). Patient agreeable to PT evaluation. Patient provides PLOF and home environment, long use of SPC, recurrent falls. This date pt performs all basic mobility for ADL at supervision or better, no dizziness, no LOB, frequent intermittent self stabilization with hand on furniture. Pt on room air at SpO2: 97%. Patient's assessment this date reveals the patient requires additional DME for safety and/or physical assistance to complete their typical ADL. This appears to be consistent with baseline functional status. Patient will benefit from skilled PT intervention to maximize independence and safety in mobility required for basic ADL performance at discharge.       Follow Up Recommendations No PT follow up (Pt is homeless, DC to shelter, unclear that pt can receive services there.)    Equipment Recommendations  Rolling walker with 5" wheels (missign his [facility])    Recommendations for Other Services       Precautions / Restrictions Precautions Precautions: Fall      Mobility  Bed Mobility Overal bed mobility: Independent                  Transfers Overall transfer level: Independent                  Ambulation/Gait Ambulation/Gait assistance: Modified independent (Device/Increase time) Gait Distance (Feet): 120 Feet Assistive device: None;IV Pole       General Gait Details: AMB several laps in room, uses IV pole for last lap for simpicity.Reports feels near baseline. mild HA  when up.  Stairs            Wheelchair Mobility    Modified Rankin (Stroke Patients Only)       Balance                                             Pertinent Vitals/Pain Pain Assessment: No/denies pain    Home Living Family/patient expects to be discharged to:: Shelter/Homeless                      Prior Function Level of Independence: Independent with assistive device(s)         Comments: pt has difficulty with long distance AMB at baseline, using a SPC for a long time due to baalnce issues and falls, does not know where his [facility] is.     Hand Dominance        Extremity/Trunk Assessment                Communication   Communication: No difficulties  Cognition Arousal/Alertness: Awake/alert (fatigued and sleepy) Behavior During Therapy: WFL for tasks assessed/performed Overall Cognitive Status: Within Functional Limits for tasks assessed  General Comments      Exercises     Assessment/Plan    PT Assessment Patient needs continued PT services  PT Problem List Decreased strength;Decreased range of motion;Decreased activity tolerance;Decreased mobility       PT Treatment Interventions DME instruction;Gait training;Functional mobility training;Therapeutic activities;Therapeutic exercise    PT Goals (Current goals can be found in the Care Plan section)  Acute Rehab PT Goals Patient Stated Goal: get some rest PT Goal Formulation: With patient Time For Goal Achievement: 08/21/20 Potential to Achieve Goals: Fair    Frequency Min 2X/week   Barriers to discharge Inaccessible home environment      Co-evaluation               AM-PAC PT "6 Clicks" Mobility  Outcome Measure Help needed turning from your back to your side while in a flat bed without using bedrails?: None Help needed moving from lying on your back to sitting on the side of a flat bed without  using bedrails?: None Help needed moving to and from a bed to a chair (including a wheelchair)?: None Help needed standing up from a chair using your arms (e.g., wheelchair or bedside chair)?: None Help needed to walk in hospital room?: A Little Help needed climbing 3-5 steps with a railing? : A Little 6 Click Score: 22    End of Session   Activity Tolerance: Patient tolerated treatment well;No increased pain Patient left: in bed;with call bell/phone within reach;with nursing/sitter in room   PT Visit Diagnosis: Difficulty in walking, not elsewhere classified (R26.2);Repeated falls (R29.6)    Time: 1347-1401 PT Time Calculation (min) (ACUTE ONLY): 14 min   Charges:   PT Evaluation $PT Eval Low Complexity: 1 Low          2:15 PM, 08/07/20 Rosamaria Lints, PT, DPT Physical Therapist - [facility]  [phone] (ASCOM)    Darron Stuck C 08/07/2020, 2:13 PM

## 2020-08-07 NOTE — Progress Notes (Addendum)
PROGRESS NOTE    Steve Alvarado  ZOX:[id number] DOB: 12-Dec-1955 DOA: 08/06/2020 PCP: Steve PlunkBarnhouse, [name], MD   Assessment & Plan:   Active Problems:   Syncope   Syncope: etiology unclear. Troponins are minimally elevated. Carotid US shows trace smooth plaque b/l w/o evidence of stenosis & vertebral arteries are patient w/ normal antegrade flow . Echo is pending   Hypertensive urgency: resolved  HTN: continue on home dose of amlodipine, clonidine. IV labetalol prn   Hyponatremia: trending up today. Continue on IVFs  DM2: uncontrolled. Continue on lantus, SSI w/ accuchecks   HLD: continue on statin   GERD: continue on PPI  COVID19 infection: asymptomatic. Not requiring oxygen currently. Vitals are stable. Will hold off starting any treatment currently. CXR shows no acute findings. Continue airborne & contact precautions. Unvaccinated   Likely depression: severity unknown. Recently separated from wife and living in a shelter. Started on citalopram   AKI: continue on IVFs. Cr is trending up from day prior. Avoid nephrotoxic meds    DVT prophylaxis: lovenox Code Status: full Family Communication:  Disposition Plan: likely d/c back home .  Level of care: Med-Surg   Status is: Observation  The patient remains OBS appropriate and will d/c before 2 midnights.  Dispo: The patient is from: Home              Anticipated d/c is to: Home              Patient currently is not medically stable to d/c.   Difficult to place patient Yes       Consultants:      Procedures:   Antimicrobials:    Subjective: Pt c/o feeling down  Objective: Vitals:   08/06/20 2217 08/06/20 2218 08/06/20 2222 08/07/20 0400  BP: 118/68 (!) 110/57 121/74 (!) 103/55  Pulse: 72 75 79 65  Resp: 16   16  Temp:    98.1 F (36.7 C)  TempSrc:    Oral  SpO2: 100% 100%  100%    Intake/Output Summary (Last 24 hours) at 08/07/2020 0747 Last data filed at 08/07/2020 0545 Gross per 24  hour  Intake 1793.95 ml  Output 750 ml  Net 1043.95 ml   There were no vitals filed for this visit.  Examination:  General exam: Appears comfortable but unhappy  Respiratory system: clear breath sounds b/l  Cardiovascular system: S1/S2+. No rubs or clicks Gastrointestinal system: Abd is soft, NT, ND & hypoactive bowel sounds  Central nervous system: alert and oriented. Moves all extremities  Psychiatry: judgement and insight appear normal. Flat mood and affect    Data Reviewed: I have personally reviewed following labs and imaging studies  CBC: Recent Labs  Lab 08/06/20 0035 08/06/20 0452 08/07/20 0551  WBC 7.1 7.2 5.1  NEUTROABS 4.9  --   --   HGB 14.4 14.3 12.6*  HCT 40.0 40.1 35.4*  MCV 83.9 84.8 85.3  PLT 219 227 205   Basic Metabolic Panel: Recent Labs  Lab 08/06/20 0141 08/06/20 0452  NA 129* 131*  Alvarado 4.5 4.3  CL 97* 99  CO2 21* 22  GLUCOSE 407* 373*  BUN 21 20  CREATININE 1.16 1.06  CALCIUM 9.2 8.9   GFR: Estimated Creatinine Clearance: 79.7 mL/min (by C-G formula based on SCr of 1.06 mg/dL). Liver Function Tests: Recent Labs  Lab 08/06/20 0141  AST 27  ALT 22  ALKPHOS 73  BILITOT 1.5*  PROT 7.7  ALBUMIN 3.8   Recent Labs  Lab 08/06/20 0141  LIPASE 33   No results for input(s): AMMONIA in the last 168 hours. Coagulation Profile: No results for input(s): INR, PROTIME in the last 168 hours. Cardiac Enzymes: No results for input(s): CKTOTAL, CKMB, CKMBINDEX, TROPONINI in the last 168 hours. BNP (last 3 results) No results for input(s): PROBNP in the last 8760 hours. HbA1C: Recent Labs    08/06/20 0452  HGBA1C 13.9*   CBG: Recent Labs  Lab 08/06/20 0116 08/06/20 0732 08/06/20 1234 08/06/20 1806 08/06/20 2215  GLUCAP 382* 325* 317* 106* 310*   Lipid Profile: No results for input(s): CHOL, HDL, LDLCALC, TRIG, CHOLHDL, LDLDIRECT in the last 72 hours. Thyroid Function Tests: Recent Labs    08/06/20 0452  TSH 6.004*   Anemia  Panel: No results for input(s): VITAMINB12, FOLATE, FERRITIN, TIBC, IRON, RETICCTPCT in the last 72 hours. Sepsis Labs: No results for input(s): PROCALCITON, LATICACIDVEN in the last 168 hours.  Recent Results (from the past 240 hour(s))  Resp Panel by RT-PCR (Flu A&B, Covid) Nasopharyngeal Swab     Status: Abnormal   Collection Time: 08/06/20  4:52 AM   Specimen: Nasopharyngeal Swab; Nasopharyngeal(NP) swabs in vial transport medium  Result Value Ref Range Status   SARS Coronavirus 2 by RT PCR POSITIVE (A) NEGATIVE Final    Comment: RESULT CALLED TO, READ BACK BY AND VERIFIED WITH: EMILY GORMAN@0700  ON 08/06/20 BY HKP (NOTE) SARS-CoV-2 target nucleic acids are NOT DETECTED.  The SARS-CoV-2 RNA is generally detectable in upper respiratory specimens during the acute phase of infection. The lowest concentration of SARS-CoV-2 viral copies this assay can detect is 138 copies/mL. A negative result does not preclude SARS-Cov-2 infection and should not be used as the sole basis for treatment or other patient management decisions. A negative result may occur with  improper specimen collection/handling, submission of specimen other than nasopharyngeal swab, presence of viral mutation(s) within the areas targeted by this assay, and inadequate number of viral copies(<138 copies/mL). A negative result must be combined with clinical observations, patient history, and epidemiological information. The expected result is Negative.  Fact Sheet for Patients:  BloggerCourse.com  Fact Sheet  for Healthcare Providers:  SeriousBroker.it  This test is not yet approved or cleared by the [place] FDA and  has been authorized for detection and/or diagnosis of SARS-CoV-2 by FDA under an Emergency Use Authorization (EUA). This EUA will remain  in effect (meaning this test can be used) for the duration of the COVID-19 declaration under Section 564(b)(1)  of the Act, 21 U.S.C.section 360bbb-3(b)(1), unless the authorization is terminated  or revoked sooner.       Influenza A by PCR NEGATIVE NEGATIVE Final   Influenza B by PCR NEGATIVE NEGATIVE Final    Comment: (NOTE) The Xpert Xpress SARS-CoV-2/FLU/RSV plus assay is intended as an aid in the diagnosis of influenza from Nasopharyngeal swab specimens and should not be used as a sole basis for treatment. Nasal washings and aspirates are unacceptable for Xpert Xpress SARS-CoV-2/FLU/RSV testing.  Fact Sheet for Patients: BloggerCourse.com  Fact Sheet for Healthcare Providers: SeriousBroker.it  This test is not yet approved or cleared by the [place] FDA and has been authorized for detection and/or diagnosis of SARS-CoV-2 by FDA under an Emergency Use Authorization (EUA). This EUA will remain in effect (meaning this test can be used) for the duration of the COVID-19 declaration under Section 564(b)(1) of the Act, 21 U.S.C. section 360bbb-3(b)(1), unless the authorization is terminated or revoked.  Performed at [facility]  Lab, [address] [place], [place] [address]          Radiology Studies: DG Ribs Unilateral W/Chest Left  Result Date: 08/06/2020 CLINICAL DATA:  more pain after fall tonight in same area as previous pain EXAM: LEFT RIBS AND CHEST - 3+ VIEW COMPARISON:  Aug 05, 2020. FINDINGS: No displaced fracture or other bone lesions are seen involving the ribs. There is no evidence of pneumothorax or pleural effusion. Both lungs are clear. Heart size and mediastinal contours are within normal limits. IMPRESSION: No acute findings. Electronically Signed   By: Maudry Mayhew MD   On: 08/06/2020 02:43   DG Ribs Unilateral W/Chest Left  Result Date: 08/05/2020 CLINICAL DATA:  Fall, left lateral rib pain, initial encounter. EXAM: LEFT RIBS AND CHEST - 3+ VIEW COMPARISON:  Chest radiograph 10/12/2019. FINDINGS:  Trachea is midline. Heart size stable. Lungs are clear. No pleural fluid. Dedicated views of the left ribs show no definite acute fracture. IMPRESSION: No acute findings. Electronically Signed   By: Leanna Battles M.D.   On: 08/05/2020 10:37   CT Head Wo Contrast  Result Date: 08/06/2020 CLINICAL DATA:  Fall EXAM: CT HEAD WITHOUT CONTRAST CT CERVICAL SPINE WITHOUT CONTRAST TECHNIQUE: Multidetector CT imaging of the head and cervical spine was performed following the standard protocol without intravenous contrast. Multiplanar CT image reconstructions of the cervical spine were also generated. COMPARISON:  None. FINDINGS: CT HEAD FINDINGS Brain: There is no mass, hemorrhage or extra-axial collection. The size and configuration of the ventricles and extra-axial CSF spaces are normal. The brain parenchyma is normal, without evidence of acute or chronic infarction. Vascular: No abnormal hyperdensity of the major intracranial arteries or dural venous sinuses. No intracranial atherosclerosis. Skull: The visualized skull base, calvarium and extracranial soft tissues are normal. Sinuses/Orbits: No fluid levels or advanced mucosal thickening of the visualized paranasal sinuses. No mastoid or middle ear effusion. The orbits are normal. CT CERVICAL SPINE FINDINGS Alignment: No static subluxation. Facets are aligned. Occipital condyles are normally positioned. Skull base and vertebrae: No acute fracture. Soft tissues and spinal canal: No prevertebral fluid or swelling. No visible canal hematoma. Disc levels: No advanced spinal canal or neural foraminal stenosis. Upper chest: No pneumothorax, pulmonary nodule or pleural effusion. Other: Normal visualized paraspinal cervical soft tissues. IMPRESSION: 1. No acute intracranial abnormality. 2. No acute fracture or static subluxation of the cervical spine. Electronically Signed   By: Deatra Robinson M.D.   On: 08/06/2020 02:31   CT Cervical Spine Wo Contrast  Result Date:  08/06/2020 CLINICAL DATA:  Fall EXAM: CT HEAD WITHOUT CONTRAST CT CERVICAL SPINE WITHOUT CONTRAST TECHNIQUE: Multidetector CT imaging of the head and cervical spine was performed following the standard protocol without intravenous contrast. Multiplanar CT image reconstructions of the cervical spine were also generated. COMPARISON:  None. FINDINGS: CT HEAD FINDINGS Brain: There is no mass, hemorrhage or extra-axial collection. The size and configuration of the ventricles and extra-axial CSF spaces are normal. The brain parenchyma is normal, without evidence of acute or chronic infarction. Vascular: No abnormal hyperdensity of the major intracranial arteries or dural venous sinuses. No intracranial atherosclerosis. Skull: The visualized skull base, calvarium and extracranial soft tissues are normal. Sinuses/Orbits: No fluid levels or advanced mucosal thickening of the visualized paranasal sinuses. No mastoid or middle ear effusion. The orbits are normal. CT CERVICAL SPINE FINDINGS Alignment: No static subluxation. Facets are aligned. Occipital condyles are normally positioned. Skull base and vertebrae: No acute fracture. Soft tissues and spinal  canal: No prevertebral fluid or swelling. No visible canal hematoma. Disc levels: No advanced spinal canal or neural foraminal stenosis. Upper chest: No pneumothorax, pulmonary nodule or pleural effusion. Other: Normal visualized paraspinal cervical soft tissues. IMPRESSION: 1. No acute intracranial abnormality. 2. No acute fracture or static subluxation of the cervical spine. Electronically Signed   By: Deatra Robinson M.D.   On: 08/06/2020 02:31   US Carotid Bilateral  Result Date: 08/06/2020 CLINICAL DATA:  Syncope EXAM: BILATERAL CAROTID DUPLEX ULTRASOUND TECHNIQUE: Wallace Cullens scale imaging, color Doppler and duplex ultrasound were performed of bilateral carotid and vertebral arteries in the neck. COMPARISON:  None. FINDINGS: Criteria: Quantification of carotid stenosis is  based on velocity parameters that correlate the residual internal carotid diameter with NASCET-based stenosis levels, using the diameter of the distal internal carotid lumen as the denominator for stenosis measurement. The following velocity measurements were obtained: RIGHT ICA: 71/19 cm/sec CCA: 53/12 cm/sec SYSTOLIC ICA/CCA RATIO:  1.3 ECA:  70 cm/sec LEFT ICA: 62/13 cm/sec CCA: 50/10 cm/sec SYSTOLIC ICA/CCA RATIO:  1.2 ECA:  78 cm/sec RIGHT CAROTID ARTERY: Trace smooth heterogeneous atherosclerotic plaque without evidence of significant stenosis. RIGHT VERTEBRAL ARTERY:  Patent with normal antegrade flow. LEFT CAROTID ARTERY: Trace smooth heterogeneous atherosclerotic plaque in the proximal internal carotid artery without evidence of stenosis. LEFT VERTEBRAL ARTERY: Vertebral artery is patent with normal antegrade flow. IMPRESSION: 1. Trace smooth heterogeneous atherosclerotic plaque bilaterally without evidence of internal carotid artery stenosis. 2. Vertebral arteries are patent with normal antegrade flow. Signed, Sterling Big, MD, RPVI [facility] Electronically Signed   By: Malachy Moan M.D.   On: 08/06/2020 12:14        Scheduled Meds: . amLODipine  10 mg Oral Daily  . atorvastatin  40 mg Oral QPM  . cloNIDine  0.2 mg Oral BID  . enoxaparin (LOVENOX) injection  40 mg Subcutaneous Q24H  . insulin aspart  0-20 Units Subcutaneous TID AC & HS  . insulin glargine  20 Units Subcutaneous BID  . lidocaine  1 patch Transdermal Q12H  . pantoprazole  40 mg Oral BID   Continuous Infusions: . sodium chloride 75 mL/hr at 08/07/20 0545     LOS: 0 days    Time spent: 34 mins    Charise Killian, MD [facility] Pager [phone]  If 7PM-7AM, please contact night-coverage 08/07/2020, 7:47 AM

## 2020-08-07 NOTE — Care Management Obs Status (Signed)
MEDICARE OBSERVATION STATUS NOTIFICATION   Patient Details  Name: Steve Alvarado MRN: [id number] Date of Birth: January 17, 1956   Medicare Observation Status Notification Given:  Yes (On COVID isolation precautions. RN will give to him when she goes in the room.)    Margarito Liner, LCSW 08/07/2020, 12:54 PM

## 2020-08-08 DIAGNOSIS — I10 Essential (primary) hypertension: Secondary | ICD-10-CM | POA: Diagnosis not present

## 2020-08-08 DIAGNOSIS — R55 Syncope and collapse: Secondary | ICD-10-CM | POA: Diagnosis not present

## 2020-08-08 DIAGNOSIS — F3289 Other specified depressive episodes: Secondary | ICD-10-CM | POA: Diagnosis not present

## 2020-08-08 LAB — GLUCOSE, CAPILLARY
Glucose-Capillary: 195 mg/dL — ABNORMAL HIGH (ref 70–99)
Glucose-Capillary: 176 mg/dL — ABNORMAL HIGH (ref 70–99)
Glucose-Capillary: 94 mg/dL (ref 70–99)
Glucose-Capillary: 99 mg/dL (ref 70–99)

## 2020-08-08 LAB — BASIC METABOLIC PANEL
Anion gap: 3 — ABNORMAL LOW (ref 5–15)
BUN: 28 mg/dL — ABNORMAL HIGH (ref 8–23)
CO2: 23 mmol/L (ref 22–32)
Calcium: 8.9 mg/dL (ref 8.9–10.3)
Chloride: 108 mmol/L (ref 98–111)
Creatinine, Ser: 1.15 mg/dL (ref 0.61–1.24)
GFR, Estimated: >60 mL/min (ref >60)
Glucose, Bld: 170 mg/dL — ABNORMAL HIGH (ref 70–99)
Potassium: 4.5 mmol/L (ref 3.5–5.1)
Sodium: 134 mmol/L — ABNORMAL LOW (ref 135–145)

## 2020-08-08 LAB — CBC
HCT: 35.9 % — ABNORMAL LOW (ref 39.0–52.0)
Hemoglobin: 12.7 g/dL — ABNORMAL LOW (ref 13.0–17.0)
MCH: 30.1 pg (ref 26.0–34.0)
MCHC: 35.4 g/dL (ref 30.0–36.0)
MCV: 85.1 fL (ref 80.0–100.0)
Platelets: 198 10*3/uL (ref 150–400)
RBC: 4.22 MIL/uL (ref 4.22–5.81)
RDW: 12.1 % (ref 11.5–15.5)
WBC: 6.2 10*3/uL (ref 4.0–10.5)
nRBC: 0 % (ref 0.0–0.2)

## 2020-08-08 LAB — ECHOCARDIOGRAM COMPLETE
AR max vel: 2.5 cm2
AV Area VTI: 2.62 cm2
AV Area mean vel: 2.45 cm2
AV Mean grad: 3 mmHg
AV Peak grad: 5 mmHg
Ao pk vel: 1.12 m/s
Area-P 1/2: 3.26 cm2
S' Lateral: 3.16 cm

## 2020-08-08 MED ORDER — ALPRAZOLAM 0.5 MG PO TABS: 0.5000 mg | ORAL_TABLET | Freq: Three times a day (TID) | ORAL | Status: DC | PRN

## 2020-08-08 MED ORDER — GABAPENTIN 600 MG PO TABS
300.0000 mg | ORAL_TABLET | Freq: Three times a day (TID) | ORAL | Status: DC
  Administered 2020-08-08 – 2020-08-09 (×4): 300 mg via ORAL
  Filled 2020-08-08 (×4): qty 1

## 2020-08-08 MED ORDER — CHLORDIAZEPOXIDE HCL 25 MG PO CAPS: 25.0000 mg | ORAL_CAPSULE | Freq: Three times a day (TID) | ORAL | Status: DC | PRN

## 2020-08-08 NOTE — Progress Notes (Signed)
Mobility Specialist - Progress Note   08/08/20 1300  Mobility  Activity Ambulated in room  Level of Assistance Independent  Assistive Device None  Distance Ambulated (ft) 75 ft  Mobility Ambulated independently in room  Mobility Response Tolerated well  Mobility performed by Mobility specialist  $Mobility charge 1 Mobility    Pre-mobility: 58 HR, 100% SpO2 Post-mobility: 59 HR, 100% SpO2   Pt ambulated in room independently. No LOB. Denied dizziness and SOB on RA. Denied exhaustion from activity.    Steve Alvarado Mobility Specialist 08/08/20, 1:28 PM

## 2020-08-08 NOTE — TOC Progression Note (Addendum)
Transition of Care [facility]) - Progression Note    Patient Details  Name: Steve Alvarado MRN: [id number] Date of Birth: [birth year]  [facility]) CM/SW Contact  Liliana Cline, LCSW Phone Number: 08/08/2020, 12:02 PM  Clinical Narrative:   CSW attempted call to [facility] to follow up on COVID protocol. Left VM for both Amada [employer] engineer) and Alinda [employer] promotion account executive) requesting return calls. Per VM message the office is closed Saturday & Sunday. Left number for this CSW and weekday CSW to follow up if VM is not received until Monday.  Patient mentioned to staff that he may have family he can stay with. CSW spoke to patient. He is now saying he does not have any family to stay with, that his only option is to go back to [employer]. Will need to wait until we know their COVID guidelines. Patient reported he is on the waitlist for Section 8 housing through [employer]. He asked for assistance with calling them on Monday to check his status on that list, will notify weekday TOC. Patient requested clothing as he does not have any and would like to take a bath. Provided XL underwear, top, and bottoms from clothing donation closet.    Expected Discharge Plan: Homeless Shelter Barriers to Discharge: Continued Medical Work up  Expected Discharge Plan and Services Expected Discharge Plan: Homeless Shelter     Post Acute Care Choice: Resumption of Svcs/PTA Provider Living arrangements for the past 2 months: Homeless Shelter,Homeless                                       Social Determinants of Health (SDOH) Interventions    Readmission Risk Interventions No flowsheet data found.

## 2020-08-08 NOTE — Progress Notes (Signed)
PROGRESS NOTE    Steve Alvarado  BWL:[id number] DOB: April 15, 1955 DOA: 08/06/2020 PCP: Ellery Plunk, MD   Assessment & Plan:   Active Problems:   Syncope   Syncope: etiology unclear. Troponins are minimally elevated. Carotid US shows trace smooth plaque b/l w/o evidence of stenosis & vertebral arteries are patient w/ normal antegrade flow . Echo shows EF 55-60%, normal diastolic function, no regional wall abnormalities & no atrial level shunt detected   Hypertensive urgency: resolved  HTN: continue on home dose of clonidine, amlodipine. IV labetalol prn   Hyponatremia: continues to trend up.   DM2: uncontrolled. Continue on lantus, SSI w/ accuchecks   HLD: continue on statin   GERD: continue on pantoprazole   COVID19 infection: asymptomatic. Not requiring oxygen currently. Vitals are stable. Will hold off starting any treatment currently. CXR shows no acute findings. Continue airborne & contact precautions. Unvaccinated   Likely depression: severity unknown. Recently separated from wife & living in a shelter. Continue on citalopram (started on 08/07/20)    AKI: resolved    DVT prophylaxis: lovenox Code Status: full Family Communication:  Disposition Plan: likely d/c back home .  Level of care: Med-Surg   Status is: Observation  The patient remains OBS appropriate and will d/c before 2 midnights., unsafe d/c plan, waiting to hear back from pt's shelter in regards to COVID19 protocol/quarantine   Dispo: The patient is from: Home              Anticipated d/c is to: Home              Patient currently is not medically stable to d/c.   Difficult to place patient Yes       Consultants:      Procedures:   Antimicrobials:    Subjective: Pt c/o b/l hand pain   Objective: Vitals:   08/07/20 2006 08/07/20 2011 08/07/20 2321 08/08/20 0645  BP: 128/68 126/66 118/64 121/73  Pulse: 66 71 62 62  Resp: 16 18 16 16   Temp:   98.5 F (36.9 C)    TempSrc:      SpO2: 100%  100% 99%    Intake/Output Summary (Last 24 hours) at 08/08/2020 0745 Last data filed at 08/08/2020 08/10/2020 Gross per 24 hour  Intake --  Output 1075 ml  Net -1075 ml   There were no vitals filed for this visit.  Examination:  General exam: Appears comfortable  Respiratory system: clear breath sounds b/l. No wheezes, rales  Cardiovascular system: S1 & S2+. No rubs or clicks  Gastrointestinal system: Abd is soft, NT, ND & hypoactive bowel sounds  Central nervous system: Alert and oriented. Moves all extremities  Psychiatry: judgement and insight appear normal. Flat mood and affect     Data Reviewed: I have personally reviewed following labs and imaging studies  CBC: Recent Labs  Lab 08/06/20 0035 08/06/20 0452 08/07/20 0551 08/08/20 0438  WBC 7.1 7.2 5.1 6.2  NEUTROABS 4.9  --   --   --   HGB 14.4 14.3 12.6* 12.7*  HCT 40.0 40.1 35.4* 35.9*  MCV 83.9 84.8 85.3 85.1  PLT 219 227 205 198   Basic Metabolic Panel: Recent Labs  Lab 08/06/20 0141 08/06/20 0452 08/07/20 0551 08/08/20 0438  NA 129* 131* 133* 134*  K 4.5 4.3 3.9 4.5  CL 97* 99 102 108  CO2 21* 22 23 23   GLUCOSE 407* 373* 230* 170*  BUN 21 20 32* 28*  CREATININE 1.16 1.06 1.48*  1.15  CALCIUM 9.2 8.9 8.6* 8.9   GFR: Estimated Creatinine Clearance: 73.5 mL/min (by C-G formula based on SCr of 1.15 mg/dL). Liver Function Tests: Recent Labs  Lab 08/06/20 0141  AST 27  ALT 22  ALKPHOS 73  BILITOT 1.5*  PROT 7.7  ALBUMIN 3.8   Recent Labs  Lab 08/06/20 0141  LIPASE 33   No results for input(s): AMMONIA in the last 168 hours. Coagulation Profile: No results for input(s): INR, PROTIME in the last 168 hours. Cardiac Enzymes: No results for input(s): CKTOTAL, CKMB, CKMBINDEX, TROPONINI in the last 168 hours. BNP (last 3 results) No results for input(s): PROBNP in the last 8760 hours. HbA1C: Recent Labs    08/06/20 0452  HGBA1C 13.9*   CBG: Recent Labs  Lab  08/06/20 2215 08/07/20 0753 08/07/20 1115 08/07/20 1622 08/07/20 2319  GLUCAP 310* 230* 314* 145* 132*   Lipid Profile: No results for input(s): CHOL, HDL, LDLCALC, TRIG, CHOLHDL, LDLDIRECT in the last 72 hours. Thyroid Function Tests: Recent Labs    08/06/20 0452  TSH 6.004*   Anemia Panel: No results for input(s): VITAMINB12, FOLATE, FERRITIN, TIBC, IRON, RETICCTPCT in the last 72 hours. Sepsis Labs: No results for input(s): PROCALCITON, LATICACIDVEN in the last 168 hours.  Recent Results (from the past 240 hour(s))  Resp Panel by RT-PCR (Flu A&B, Covid) Nasopharyngeal Swab     Status: Abnormal   Collection Time: 08/06/20  4:52 AM   Specimen: Nasopharyngeal Swab; Nasopharyngeal(NP) swabs in vial transport medium  Result Value Ref Range Status   SARS Coronavirus 2 by RT PCR POSITIVE (A) NEGATIVE Final    Comment: RESULT CALLED TO, READ BACK BY AND VERIFIED WITH: EMILY GORMAN@0700  ON 08/06/20 BY HKP (NOTE) SARS-CoV-2 target nucleic acids are NOT DETECTED.  The SARS-CoV-2 RNA is generally detectable in upper respiratory specimens during the acute phase of infection. The lowest concentration of SARS-CoV-2 viral copies this assay can detect is 138 copies/mL. A negative result does not preclude SARS-Cov-2 infection and should not be used as the sole basis for treatment or other patient management decisions. A negative result may occur with  improper specimen collection/handling, submission of specimen other than nasopharyngeal swab, presence of viral mutation(s) within the areas targeted by this assay, and inadequate number of viral copies(<138 copies/mL). A negative result must be combined with clinical observations, patient history, and epidemiological information. The expected result is Negative.  Fact Sheet for Patients:  BloggerCourse.com  Fact Sheet  for Healthcare Providers:  SeriousBroker.it  This test is not yet  approved or cleared by the [place] FDA and  has been authorized for detection and/or diagnosis of SARS-CoV-2 by FDA under an Emergency Use Authorization (EUA). This EUA will remain  in effect (meaning this test can be used) for the duration of the COVID-19 declaration under Section 564(b)(1) of the Act, 21 U.S.C.section 360bbb-3(b)(1), unless the authorization is terminated  or revoked sooner.       Influenza A by PCR NEGATIVE NEGATIVE Final   Influenza B by PCR NEGATIVE NEGATIVE Final    Comment: (NOTE) The Xpert Xpress SARS-CoV-2/FLU/RSV plus assay is intended as an aid in the diagnosis of influenza from Nasopharyngeal swab specimens and should not be used as a sole basis for treatment. Nasal washings and aspirates are unacceptable for Xpert Xpress SARS-CoV-2/FLU/RSV testing.  Fact Sheet for Patients: BloggerCourse.com  Fact Sheet for Healthcare Providers: SeriousBroker.it  This test is not yet approved or cleared by the [place] FDA and has been  authorized for detection and/or diagnosis of SARS-CoV-2 by FDA under an Emergency Use Authorization (EUA). This EUA will remain in effect (meaning this test can be used) for the duration of the COVID-19 declaration under Section 564(b)(1) of the Act, 21 U.S.C. section 360bbb-3(b)(1), unless the authorization is terminated or revoked.  Performed at [facility], [address] [place], [place] [address]          Radiology Studies: US Carotid Bilateral  Result Date: 08/06/2020 CLINICAL DATA:  Syncope EXAM: BILATERAL CAROTID DUPLEX ULTRASOUND TECHNIQUE: Wallace Cullens scale imaging, color Doppler and duplex ultrasound were performed of bilateral carotid and vertebral arteries in the neck. COMPARISON:  None. FINDINGS: Criteria: Quantification of carotid stenosis is based on velocity parameters that correlate the residual internal carotid diameter with NASCET-based  stenosis levels, using the diameter of the distal internal carotid lumen as the denominator for stenosis measurement. The following velocity measurements were obtained: RIGHT ICA: 71/19 cm/sec CCA: 53/12 cm/sec SYSTOLIC ICA/CCA RATIO:  1.3 ECA:  70 cm/sec LEFT ICA: 62/13 cm/sec CCA: 50/10 cm/sec SYSTOLIC ICA/CCA RATIO:  1.2 ECA:  78 cm/sec RIGHT CAROTID ARTERY: Trace smooth heterogeneous atherosclerotic plaque without evidence of significant stenosis. RIGHT VERTEBRAL ARTERY:  Patent with normal antegrade flow. LEFT CAROTID ARTERY: Trace smooth heterogeneous atherosclerotic plaque in the proximal internal carotid artery without evidence of stenosis. LEFT VERTEBRAL ARTERY: Vertebral artery is patent with normal antegrade flow. IMPRESSION: 1. Trace smooth heterogeneous atherosclerotic plaque bilaterally without evidence of internal carotid artery stenosis. 2. Vertebral arteries are patent with normal antegrade flow. Signed, Sterling Big, MD, RPVI [facility] Electronically Signed   By: Malachy Moan M.D.   On: 08/06/2020 12:14        Scheduled Meds: . amLODipine  10 mg Oral Daily  . atorvastatin  40 mg Oral QPM  . citalopram  20 mg Oral Daily  . cloNIDine  0.2 mg Oral BID  . enoxaparin (LOVENOX) injection  40 mg Subcutaneous Q24H  . insulin aspart  0-20 Units Subcutaneous TID AC & HS  . insulin aspart  0-5 Units Subcutaneous Once  . insulin aspart  4 Units Subcutaneous TID WC  . insulin glargine  20 Units Subcutaneous BID  . lidocaine  1 patch Transdermal Q12H  . pantoprazole  40 mg Oral BID   Continuous Infusions: . sodium chloride 75 mL/hr at 08/07/20 1958     LOS: 0 days    Time spent: 31 mins    Charise Killian, MD [facility] Pager [phone]  If 7PM-7AM, please contact night-coverage 08/08/2020, 7:45 AM

## 2020-08-08 NOTE — Progress Notes (Signed)
OT Cancellation Note  Patient Details Name: Steve Alvarado MRN: [id number] DOB: 05-30-1955   Cancelled Treatment:    Reason Eval/Treat Not Completed: OT screened, no needs identified, will sign off. Consult received, chart reviewed. Upon attempt, pt sleeping, easily wakes. Pt independent with ADL at this time. Denies concerns and reports confidence in return to [facility] after hospitalization. Will sign off. Please re-consult if additional acute OT needs arise.   Wynona Canes, MPH, MS, OTR/L ascom [phone] 08/08/20, 8:58 AM

## 2020-08-09 DIAGNOSIS — E7849 Other hyperlipidemia: Secondary | ICD-10-CM | POA: Diagnosis not present

## 2020-08-09 DIAGNOSIS — E1169 Type 2 diabetes mellitus with other specified complication: Secondary | ICD-10-CM

## 2020-08-09 DIAGNOSIS — I10 Essential (primary) hypertension: Secondary | ICD-10-CM | POA: Diagnosis not present

## 2020-08-09 DIAGNOSIS — R55 Syncope and collapse: Secondary | ICD-10-CM | POA: Diagnosis not present

## 2020-08-09 LAB — GLUCOSE, CAPILLARY
Glucose-Capillary: 179 mg/dL — ABNORMAL HIGH (ref 70–99)
Glucose-Capillary: 164 mg/dL — ABNORMAL HIGH (ref 70–99)
Glucose-Capillary: 69 mg/dL — ABNORMAL LOW (ref 70–99)
Glucose-Capillary: 79 mg/dL (ref 70–99)
Glucose-Capillary: 161 mg/dL — ABNORMAL HIGH (ref 70–99)

## 2020-08-09 LAB — BASIC METABOLIC PANEL
Anion gap: 5 (ref 5–15)
BUN: 20 mg/dL (ref 8–23)
CO2: 24 mmol/L (ref 22–32)
Calcium: 8.6 mg/dL — ABNORMAL LOW (ref 8.9–10.3)
Chloride: 108 mmol/L (ref 98–111)
Creatinine, Ser: 0.93 mg/dL (ref 0.61–1.24)
GFR, Estimated: >60 mL/min (ref >60)
Glucose, Bld: 225 mg/dL — ABNORMAL HIGH (ref 70–99)
Potassium: 3.9 mmol/L (ref 3.5–5.1)
Sodium: 137 mmol/L (ref 135–145)

## 2020-08-09 LAB — CBC
HCT: 32.7 % — ABNORMAL LOW (ref 39.0–52.0)
Hemoglobin: 11.4 g/dL — ABNORMAL LOW (ref 13.0–17.0)
MCH: 29.9 pg (ref 26.0–34.0)
MCHC: 34.9 g/dL (ref 30.0–36.0)
MCV: 85.8 fL (ref 80.0–100.0)
Platelets: 202 10*3/uL (ref 150–400)
RBC: 3.81 MIL/uL — ABNORMAL LOW (ref 4.22–5.81)
RDW: 11.9 % (ref 11.5–15.5)
WBC: 4.5 10*3/uL (ref 4.0–10.5)
nRBC: 0 % (ref 0.0–0.2)

## 2020-08-09 MED ORDER — OXYCODONE-ACETAMINOPHEN 5-325 MG PO TABS
1.0000 | ORAL_TABLET | Freq: Four times a day (QID) | ORAL | Status: DC | PRN
  Administered 2020-08-09: 1 via ORAL
  Filled 2020-08-09: qty 1

## 2020-08-09 MED ORDER — OXYCODONE-ACETAMINOPHEN 7.5-325 MG PO TABS
1.0000 | ORAL_TABLET | Freq: Four times a day (QID) | ORAL | Status: DC | PRN
  Administered 2020-08-09 – 2020-08-10 (×3): 1 via ORAL
  Filled 2020-08-09 (×4): qty 1

## 2020-08-09 NOTE — Progress Notes (Signed)
PROGRESS NOTE    ATIF CHAPPLE  DGU:[id number] DOB: 07-21-1955 DOA: 08/06/2020 PCP: Ellery Plunk, MD   Assessment & Plan:   Active Problems:   Syncope   Syncope: etiology unclear. Troponins are minimally elevated. Carotid US shows trace smooth plaque b/l w/o evidence of stenosis & vertebral arteries are patient w/ normal antegrade flow . Echo shows EF 55-60%, normal diastolic function, no regional wall abnormalities & no atrial level shunt detected   Hypertensive urgency: resolved  HTN: continue on home dose of amlodipine, clonidine. IV labetaolol prn   Hyponatremia: resolved    DM2: uncontrolled. Continue on lantus, SSI w/ accuchecks   HLD: continue on statin   GERD: continue on PPI   COVID19 infection: asymptomatic. Not requiring oxygen currently. Vitals are stable. Will hold off starting any treatment currently. CXR shows no acute findings. Continue airborne & contact precautions. Unvaccinated   Likely depression: severity unknown. Recently separated from wife & living a shelter. Continue on citalopram (started on 08/07/20)    AKI: resolved    DVT prophylaxis: lovenox Code Status: full Family Communication:  Disposition Plan: likely d/c back home .  Level of care: Med-Surg   Status is: Observation  The patient remains OBS appropriate and will d/c before 2 midnights., unsafe d/c plan, waiting to hear back from pt's shelter in regards to COVID19 protocol/quarantine   Dispo: The patient is from: Home              Anticipated d/c is to: Home              Patient currently is not medically stable to d/c.   Difficult to place patient Yes       Consultants:      Procedures:   Antimicrobials:    Subjective: Pt c/o fatigue   Objective: Vitals:   08/08/20 1107 08/08/20 1537 08/08/20 1956 08/09/20 0444  BP: 122/75 129/80 (!) 146/78 120/69  Pulse: 61 (!) 58 64 (!) 58  Resp: 18 18 20 20   Temp: 98.2 F (36.8 C) 97.8 F (36.6 C) 98 F  (36.7 C) 98 F (36.7 C)  TempSrc:   Oral Oral  SpO2: 100% 100% 100% 100%    Intake/Output Summary (Last 24 hours) at 08/09/2020 0743 Last data filed at 08/09/2020 0551 Gross per 24 hour  Intake 3211.44 ml  Output 1151 ml  Net 2060.44 ml   There were no vitals filed for this visit.  Examination:  General exam: Appears comfortable  Respiratory system: clear breath sounds b/l. No wheezes, rales  Cardiovascular system: S1 & S2+. No rubs or clicks  Gastrointestinal system: Abd is soft, NT, ND & hypoactive bowel sounds  Central nervous system: Alert and oriented. Moves all extremities  Psychiatry: judgement and insight appear normal. Flat mood and affect     Data Reviewed: I have personally reviewed following labs and imaging studies  CBC: Recent Labs  Lab 08/06/20 0035 08/06/20 0452 08/07/20 0551 08/08/20 0438 08/09/20 0458  WBC 7.1 7.2 5.1 6.2 4.5  NEUTROABS 4.9  --   --   --   --   HGB 14.4 14.3 12.6* 12.7* 11.4*  HCT 40.0 40.1 35.4* 35.9* 32.7*  MCV 83.9 84.8 85.3 85.1 85.8  PLT 219 227 205 198 202   Basic Metabolic Panel: Recent Labs  Lab 08/06/20 0141 08/06/20 0452 08/07/20 0551 08/08/20 0438 08/09/20 0458  NA 129* 131* 133* 134* 137  K 4.5 4.3 3.9 4.5 3.9  CL 97* 99 102 108 108  CO2 21* GLUCOSE 407* 373* 230* 170* 225*  BUN 21 20 32* 28* 20  CREATININE 1.16 1.06 1.48* 1.15 0.93  CALCIUM 9.2 8.9 8.6* 8.9 8.6*   GFR: Estimated Creatinine Clearance: 90.8 mL/min (by C-G formula based on SCr of 0.93 mg/dL). Liver Function Tests: Recent Labs  Lab 08/06/20 0141  AST 27  ALT 22  ALKPHOS 73  BILITOT 1.5*  PROT 7.7  ALBUMIN 3.8   Recent Labs  Lab 08/06/20 0141  LIPASE 33   No results for input(s): AMMONIA in the last 168 hours. Coagulation Profile: No results for input(s): INR, PROTIME in the last 168 hours. Cardiac Enzymes: No results for input(s): CKTOTAL, CKMB, CKMBINDEX, TROPONINI in the last 168 hours. BNP (last 3 results) No  results for input(s): PROBNP in the last 8760 hours. HbA1C: No results for input(s): HGBA1C in the last 72 hours. CBG: Recent Labs  Lab 08/07/20 2319 08/08/20 0800 08/08/20 1109 08/08/20 1655 08/08/20 1958  GLUCAP 132* 195* 176* 94 99   Lipid Profile: No results for input(s): CHOL, HDL, LDLCALC, TRIG, CHOLHDL, LDLDIRECT in the last 72 hours. Thyroid Function Tests: No results for input(s): TSH, T4TOTAL, FREET4, T3FREE, THYROIDAB in the last 72 hours. Anemia Panel: No results for input(s): VITAMINB12, FOLATE, FERRITIN, TIBC, IRON, RETICCTPCT in the last 72 hours. Sepsis Labs: No results for input(s): PROCALCITON, LATICACIDVEN in the last 168 hours.  Recent Results (from the past 240 hour(s))  Resp Panel by RT-PCR (Flu A&B, Covid) Nasopharyngeal Swab     Status: Abnormal   Collection Time: 08/06/20  4:52 AM   Specimen: Nasopharyngeal Swab; Nasopharyngeal(NP) swabs in vial transport medium  Result Value Ref Range Status   SARS Coronavirus 2 by RT PCR POSITIVE (A) NEGATIVE Final    Comment: RESULT CALLED TO, READ BACK BY AND VERIFIED WITH: EMILY GORMAN@0700  ON 08/06/20 BY HKP (NOTE) SARS-CoV-2 target nucleic acids are NOT DETECTED.  The SARS-CoV-2 RNA is generally detectable in upper respiratory specimens during the acute phase of infection. The lowest concentration of SARS-CoV-2 viral copies this assay can detect is 138 copies/mL. A negative result does not preclude SARS-Cov-2 infection and should not be used as the sole basis for treatment or other patient management decisions. A negative result may occur with  improper specimen collection/handling, submission of specimen other than nasopharyngeal swab, presence of viral mutation(s) within the areas targeted by this assay, and inadequate number of viral copies(<138 copies/mL). A negative result must be combined with clinical observations, patient history, and epidemiological information. The expected result is  Negative.  Fact Sheet for Patients:  BloggerCourse.com  Fact Sheet  for Healthcare Providers:  SeriousBroker.it  This test is not yet approved or cleared by the [place] FDA and  has been authorized for detection and/or diagnosis of SARS-CoV-2 by FDA under an Emergency Use Authorization (EUA). This EUA will remain  in effect (meaning this test can be used) for the duration of the COVID-19 declaration under Section 564(b)(1) of the Act, 21 U.S.C.section 360bbb-3(b)(1), unless the authorization is terminated  or revoked sooner.       Influenza A by PCR NEGATIVE NEGATIVE Final   Influenza B by PCR NEGATIVE NEGATIVE Final    Comment: (NOTE) The Xpert Xpress SARS-CoV-2/FLU/RSV plus assay is intended as an aid in the diagnosis of influenza from Nasopharyngeal swab specimens and should not be used as a sole basis for treatment. Nasal washings and aspirates are unacceptable for Xpert Xpress SARS-CoV-2/FLU/RSV testing.  Fact Sheet  for Patients: BloggerCourse.com  Fact Sheet for Healthcare Providers: SeriousBroker.it  This test is not yet approved or cleared by the [place] FDA and has been authorized for detection and/or diagnosis of SARS-CoV-2 by FDA under an Emergency Use Authorization (EUA). This EUA will remain in effect (meaning this test can be used) for the duration of the COVID-19 declaration under Section 564(b)(1) of the Act, 21 U.S.C. section 360bbb-3(b)(1), unless the authorization is terminated or revoked.  Performed at [facility], [address], [place], [place] [address]          Radiology Studies: ECHOCARDIOGRAM COMPLETE  Result Date: 08/08/2020    ECHOCARDIOGRAM REPORT   Patient Name:   MARQUELL SAENZ Date of Exam: 08/07/2020 Medical Rec #:  [id number]         Height:       74.0 in Accession #:    [id number]        Weight:       185.0  lb Date of Birth:  1955/05/30         BSA:          2.103 m Patient Age:    65 years          BP:           136/79 mmHg Patient Gender: M                 HR:           63 bpm. Exam Location:  [facility] Procedure: 2D Echo, Cardiac Doppler and Color Doppler Indications:     Syncope R55  History:         Patient has no prior history of Echocardiogram examinations.                  Risk Factors:Hypertension and Dyslipidemia.  Sonographer:     Cristela Blue RDCS (AE) Referring Phys:  [id number] Vernetta Honey MANSY Diagnosing Phys: Alwyn Pea MD IMPRESSIONS  1. Left ventricular ejection fraction, by estimation, is 55 to 60%. The left ventricle has normal function. The left ventricle has no regional wall motion abnormalities. Left ventricular diastolic parameters were normal.  2. Right ventricular systolic function is normal. The right ventricular size is normal.  3. Left atrial size was mild to moderately dilated.  4. Right atrial size was mild to moderately dilated.  5. The mitral valve is normal in structure. No evidence of mitral valve regurgitation.  6. The aortic valve is normal in structure. Aortic valve regurgitation is not visualized. FINDINGS  Left Ventricle: Left ventricular ejection fraction, by estimation, is 55 to 60%. The left ventricle has normal function. The left ventricle has no regional wall motion abnormalities. The left ventricular internal cavity size was normal in size. There is  no left ventricular hypertrophy. Left ventricular diastolic parameters were normal. Right Ventricle: The right ventricular size is normal. No increase in right ventricular wall thickness. Right ventricular systolic function is normal. Left Atrium: Left atrial size was mild to moderately dilated. Right Atrium: Right atrial size was mild to moderately dilated. Pericardium: There is no evidence of pericardial effusion. Mitral Valve: The mitral valve is normal in structure. No evidence of mitral valve regurgitation. Tricuspid Valve: The  tricuspid valve is normal in structure. Tricuspid valve regurgitation is trivial. Aortic Valve: The aortic valve is normal in structure. Aortic valve regurgitation is not visualized. Aortic valve mean gradient measures 3.0 mmHg. Aortic valve peak gradient measures 5.0 mmHg. Aortic valve area, by  VTI measures 2.62 cm. Pulmonic Valve: The pulmonic valve was normal in structure. Pulmonic valve regurgitation is not visualized. Aorta: The ascending aorta was not well visualized. IAS/Shunts: No atrial level shunt detected by color flow Doppler.  LEFT VENTRICLE PLAX 2D LVIDd:         4.56 cm  Diastology LVIDs:         3.16 cm  LV e' medial:    4.24 cm/s LV PW:         1.19 cm  LV E/e' medial:  14.3 LV IVS:        1.10 cm  LV e' lateral:   4.57 cm/s LVOT diam:     2.20 cm  LV E/e' lateral: 13.3 LV SV:         52 LV SV Index:   25 LVOT Area:     3.80 cm  LEFT ATRIUM              Index       RIGHT ATRIUM           Index LA diam:        3.50 cm  1.66 cm/m  RA Area:     14.00 cm LA Vol (A2C):   119.0 ml 56.60 ml/m RA Volume:   31.60 ml  15.03 ml/m LA Vol (A4C):   50.4 ml  23.97 ml/m LA Biplane Vol: 85.5 ml  40.66 ml/m  AORTIC VALVE                   PULMONIC VALVE AV Area (Vmax):    2.50 cm    PV Vmax:        0.41 m/s AV Area (Vmean):   2.45 cm    PV Peak grad:   0.7 mmHg AV Area (VTI):     2.62 cm    RVOT Peak grad: 2 mmHg AV Vmax:           112.00 cm/s AV Vmean:          72.000 cm/s AV VTI:            0.197 m AV Peak Grad:      5.0 mmHg AV Mean Grad:      3.0 mmHg LVOT Vmax:         73.80 cm/s LVOT Vmean:        46.400 cm/s LVOT VTI:          0.136 m LVOT/AV VTI ratio: 0.69  AORTA Ao Root diam: 3.73 cm MITRAL VALVE               TRICUSPID VALVE MV Area (PHT): 3.26 cm    TR Peak grad:   9.4 mmHg MV Decel Time: 233 msec    TR Vmax:        153.00 cm/s MV E velocity: 60.80 cm/s MV A velocity: 93.40 cm/s  SHUNTS MV E/A ratio:  0.65        Systemic VTI:  0.14 m                            Systemic Diam: 2.20 cm Dwayne Salome Arnt MD Electronically signed by Alwyn Pea MD Signature Date/Time: 08/08/2020/11:34:00 AM    Final         Scheduled Meds: . amLODipine  10 mg Oral Daily  . atorvastatin  40 mg Oral QPM  . citalopram  20 mg Oral Daily  .  cloNIDine  0.2 mg Oral BID  . enoxaparin (LOVENOX) injection  40 mg Subcutaneous Q24H  . gabapentin  300 mg Oral TID  . insulin aspart  0-20 Units Subcutaneous TID AC & HS  . insulin aspart  4 Units Subcutaneous TID WC  . insulin glargine  20 Units Subcutaneous BID  . lidocaine  1 patch Transdermal Q12H  . pantoprazole  40 mg Oral BID   Continuous Infusions: . sodium chloride 75 mL/hr at 08/08/20 2344     LOS: 0 days    Time spent: 30 mins    Charise KillianJamiese M Orrin Yurkovich, MD [facility] Pager [phone]  If 7PM-7AM, please contact night-coverage 08/09/2020, 7:43 AM

## 2020-08-09 NOTE — Progress Notes (Signed)
Hypoglycemic Event  CBG:  69 @ 165  Treatment:  Patient ate 100% of dinner tray and  Graham crackers   Symptoms: none   Follow-up CBG: Time:1721 CBG Result:79  Possible Reasons for Event:  Unknown  Comments/MD notified:Dr. Mayford Knife follow hypoglycemic protocol.    Steve Alvarado, [place]

## 2020-08-09 NOTE — Plan of Care (Signed)
  Problem: Clinical Measurements: Goal: Diagnostic test results will improve Outcome: Progressing Goal: Respiratory complications will improve Outcome: Progressing Goal: Cardiovascular complication will be avoided Outcome: Progressing   Problem: Activity: Goal: Risk for activity intolerance will decrease Outcome: Progressing   Problem: Nutrition: Goal: Adequate nutrition will be maintained Outcome: Progressing   Problem: Coping: Goal: Level of anxiety will decrease Outcome: Progressing   Problem: Safety: Goal: Ability to remain free from injury will improve Outcome: Progressing

## 2020-08-10 DIAGNOSIS — F1410 Cocaine abuse, uncomplicated: Secondary | ICD-10-CM | POA: Diagnosis not present

## 2020-08-10 DIAGNOSIS — I10 Essential (primary) hypertension: Secondary | ICD-10-CM | POA: Diagnosis not present

## 2020-08-10 DIAGNOSIS — R55 Syncope and collapse: Secondary | ICD-10-CM | POA: Diagnosis not present

## 2020-08-10 LAB — BASIC METABOLIC PANEL
Anion gap: 5 (ref 5–15)
BUN: 23 mg/dL (ref 8–23)
CO2: 24 mmol/L (ref 22–32)
Calcium: 8.7 mg/dL — ABNORMAL LOW (ref 8.9–10.3)
Chloride: 109 mmol/L (ref 98–111)
Creatinine, Ser: 0.86 mg/dL (ref 0.61–1.24)
GFR, Estimated: >60 mL/min (ref >60)
Glucose, Bld: 94 mg/dL (ref 70–99)
Potassium: 4.2 mmol/L (ref 3.5–5.1)
Sodium: 138 mmol/L (ref 135–145)

## 2020-08-10 LAB — GLUCOSE, CAPILLARY
Glucose-Capillary: 98 mg/dL (ref 70–99)
Glucose-Capillary: 185 mg/dL — ABNORMAL HIGH (ref 70–99)

## 2020-08-10 LAB — CBC
HCT: 33.2 % — ABNORMAL LOW (ref 39.0–52.0)
Hemoglobin: 11.9 g/dL — ABNORMAL LOW (ref 13.0–17.0)
MCH: 30.6 pg (ref 26.0–34.0)
MCHC: 35.8 g/dL (ref 30.0–36.0)
MCV: 85.3 fL (ref 80.0–100.0)
Platelets: 210 10*3/uL (ref 150–400)
RBC: 3.89 MIL/uL — ABNORMAL LOW (ref 4.22–5.81)
RDW: 12 % (ref 11.5–15.5)
WBC: 5.4 10*3/uL (ref 4.0–10.5)
nRBC: 0 % (ref 0.0–0.2)

## 2020-08-10 MED ORDER — ATORVASTATIN CALCIUM 40 MG PO TABS: 40.0000 mg | ORAL_TABLET | Freq: Every evening | ORAL | 0 refills | Status: DC

## 2020-08-10 MED ORDER — OXYCODONE-ACETAMINOPHEN 7.5-325 MG PO TABS: 1.0000 | ORAL_TABLET | Freq: Three times a day (TID) | ORAL | 0 refills | Status: AC | PRN

## 2020-08-10 MED ORDER — HYDROCHLOROTHIAZIDE 25 MG PO TABS: 25.0000 mg | ORAL_TABLET | Freq: Every day | ORAL | 0 refills | Status: DC

## 2020-08-10 MED ORDER — CITALOPRAM HYDROBROMIDE 20 MG PO TABS: 20.0000 mg | ORAL_TABLET | Freq: Every day | ORAL | 0 refills | Status: DC

## 2020-08-10 MED ORDER — INSULIN ASPART 100 UNIT/ML IJ SOLN
0.0000 [IU] | Freq: Three times a day (TID) | INTRAMUSCULAR | Status: DC
  Administered 2020-08-10: 2 [IU] via SUBCUTANEOUS
  Filled 2020-08-10: qty 1

## 2020-08-10 NOTE — Plan of Care (Signed)
  Problem: Health Behavior/Discharge Planning: Goal: Ability to manage health-related needs will improve Outcome: Progressing   Problem: Activity: Goal: Risk for activity intolerance will decrease Outcome: Progressing   Problem: Nutrition: Goal: Adequate nutrition will be maintained Outcome: Progressing   Problem: Safety: Goal: Ability to remain free from injury will improve Outcome: Progressing   

## 2020-08-10 NOTE — Progress Notes (Signed)
Walker delivered to patient.   Madie Reno, RN

## 2020-08-10 NOTE — Progress Notes (Signed)
Steve Alvarado to be D/C'd Home per MD order.  Discussed prescriptions and follow up appointments with the patient. Prescriptions given to patient, medication list explained in detail. Pt verbalized understanding.  Allergies as of 08/10/2020      Reactions   Ace Inhibitors Cough   cough      Medication List    STOP taking these medications   traMADol 50 MG tablet Commonly known as: Ultram     TAKE these medications   atorvastatin 40 MG tablet Commonly known as: LIPITOR Take 1 tablet (40 mg total) by mouth every evening.   citalopram 20 MG tablet Commonly known as: CELEXA Take 1 tablet (20 mg total) by mouth daily. Start taking on: Aug 11, 2020   gabapentin 300 MG capsule Commonly known as: NEURONTIN Take 900 mg by mouth 2 (two) times daily.   hydrochlorothiazide 25 MG tablet Commonly known as: HYDRODIURIL Take 1 tablet (25 mg total) by mouth daily.   insulin glargine 100 unit/mL Sopn Commonly known as: LANTUS Inject 20 Units into the skin 2 (two) times daily.   lidocaine 5 % Commonly known as: Lidoderm Place 1 patch onto the skin every 12 (twelve) hours. Remove & Discard patch within 12 hours or as directed by MD   oxyCODONE-acetaminophen 7.5-325 MG tablet Commonly known as: PERCOCET Take 1 tablet by mouth every 8 (eight) hours as needed for up to 5 days for moderate pain.            Durable Medical Equipment  (From admission, onward)         Start     Ordered   08/10/20 1245  For home use only DME Walker rolling  Once       Question Answer Comment  Walker: With 5 Inch Wheels   Patient needs a walker to treat with the following condition Generalized weakness      08/10/20 1244          Vitals:   08/10/20 0917 08/10/20 1147  BP: 133/75 (!) 153/78  Pulse: (!) 57 (!) 56  Resp: 17 19  Temp: 98 F (36.7 C) 98.4 F (36.9 C)  SpO2: 100% 100%    Skin clean, dry and intact without evidence of skin break down, no evidence of skin tears noted. IV  catheter discontinued intact. Site without signs and symptoms of complications. Dressing and pressure applied. Pt denies pain at this time. No complaints noted.  An After Visit Summary was printed and given to the patient. Patient escorted via WC, and D/C home via private auto.  Madie Reno, RN

## 2020-08-10 NOTE — TOC Transition Note (Signed)
Transition of Care [facility]) - CM/SW Discharge Note   Patient Details  Name: Steve Alvarado MRN: [id number] Date of Birth: [birth year]  [facility] CM/SW Contact:  Margarito Liner, LCSW Phone Number: 08/10/2020, 1:04 PM   Clinical Narrative: Patient reported that he now has somewhere to go and that his ex-wife will transport him there. He did not specify where he was going. Patient agreeable to DME recommendation for a rolling walker. Called [employer] to order and be brought to the room before he leaves. No further concerns. CSW signing off.  Final next level of care: Other (comment) Barriers to Discharge: Barriers Resolved   Patient Goals and CMS Choice        Discharge Placement                Patient to be transferred to facility by: Ex-wife will pick him up   Patient and family notified of of transfer: 08/10/20  Discharge Plan and Services     Post Acute Care Choice: Resumption of Svcs/PTA Provider          DME Arranged: Dan Humphreys rolling DME Agency: [employer] Date DME Agency Contacted: 08/10/20   Representative spoke with at DME Agency: Oletha Cruel            Social Determinants of Health (SDOH) Interventions     Readmission Risk Interventions No flowsheet data found.

## 2020-08-10 NOTE — TOC Progression Note (Signed)
Transition of Care [facility]) - Progression Note    Patient Details  Name: Steve Alvarado MRN: [id number] Date of Birth: 1955-04-23  Transition [facility] Care [facility]) CM/SW Contact  Margarito Liner, LCSW Phone Number: 08/10/2020, 9:20 AM  Clinical Narrative:  [facility] is unable to accept patient back due to falls. Patient is aware and will call around to see if there is anywhere else he can stay. Obtained phone number for him to call [facility] and check his status on the wait list. Administrator, sports member said CSW cannot check due to confidentiality. Will give phone number to RN today to give to patient.   Expected Discharge Plan: Homeless Shelter Barriers to Discharge: Continued Medical Work up  Expected Discharge Plan and Services Expected Discharge Plan: Homeless Shelter     Post Acute Care Choice: Resumption of Svcs/PTA Provider Living arrangements for the past 2 months: Homeless Shelter,Homeless                                       Social Determinants of Health (SDOH) Interventions    Readmission Risk Interventions No flowsheet data found.

## 2020-08-10 NOTE — Discharge Summary (Signed)
Physician Discharge Summary  Steve Alvarado ZOX:[id number] DOB: Mar 07, 1956 DOA: 08/06/2020  PCP: Ellery Plunk, MD  Admit date: 08/06/2020 Discharge date: 08/10/2020  Admitted From: home  Disposition: home  Recommendations for Outpatient Follow-up:  1. Follow up with PCP in 1-2 weeks   Home Health: no  Equipment/Devices:  Discharge Condition: stable  CODE STATUS: full  Diet recommendation: Heart Healthy / Carb Modified  Brief/Interim Summary: HPI was taken from Dr. Arville Care: Steve Alvarado is a 65 y.o. African-American male with medical history significant for coronary artery disease, diabetes mellitus, hypertension, dyslipidemia and hypothyroidism, who presented to the ER with the onset of syncope.  The [facility] apparently [facility]  He had an unwitnessed fall there.  He was found with his pants down on the ground.  He reportedly hit the back of his head.  He also had urinary incontinence and decreased responsiveness.  He has no history of seizures and did not have witnessed seizures.  He has subsequent left lower lateral ribs pain.  No headache or paresthesias or focal muscle weakness.  No neck pain or stiffness.  No chest pain or palpitations.  No nausea or vomiting or abdominal pain.  No dysuria, oliguria or hematuria or flank pain.  He was fairly somnolent but arousable during the interview.  ED Course: Upon presentation to the ER, blood pressure was 163/86 and later 204/109 with otherwise normal vital signs.  Labs revealed hyponatremia 129 hypochloremia 97, hyperglycemia of 407, CO2 of 21 anion gap of 11 and total bili 1.5.  High-sensitivity troponin was 2224.  CBC was within normal.  Alcohol was less than 10.  Urine drug screen was positive for cocaine EKG as reviewed by me :  showed sinus rhythm with a rate of 81 with poor R wave progression and prolonged PR interval. Imaging: No acute findings on his chest x-ray.  Head and C-spine CT  showed no acute abnormalities.  The patient was given a bolus of normal IV normal saline.  Will be admitted to AN observation team out of bed to reevaluation and management.  Hospital course from Dr. Mayford Knife 5/19-5/23/22: Pt presented w/ syncope of unknown etiology, likely secondary to cocaine use. Work-up was completely neg, including troponins (highest was 29, minimally elevated), carotid US as well as echo. Pt stated he had a similar syncopal episode last year when he was dx w/ COVID19. Also, pt again tested positive for COVID19 but was asymptomatic. CXR showed no acute findings and no other symptoms were present so pt did not receive any treatment for COVID19. Pt is unvaccinated and does not want to get vaccinated. Of note, pt was started on citalopram for likely depression as the pt and his wife recently separated. For more information, please see previous progress/consult notes.    Discharge Diagnoses:  Active Problems:   Syncope  Syncope: etiology unclear. Troponins are minimally elevated. Carotid US shows trace smooth plaque b/l w/o evidence of stenosis & vertebral arteries are patient w/ normal antegrade flow . Echo shows EF 55-60%, normal diastolic function, no regional wall abnormalities & no atrial level shunt detected   Illicit drug abuse: urine drug screen was positive for cocaine   Hypertensive urgency: resolved  HTN: continue on home dose of amlodipine, clonidine. IV labetaolol prn   Hyponatremia: resolved    DM2: uncontrolled. Continue on lantus, SSI w/ accuchecks   HLD: continue on statin   GERD: continue on PPI   COVID19 infection: asymptomatic. Not  requiring oxygen currently. Vitals are stable. Will hold off starting any treatment currently. CXR shows no acute findings. Continue airborne & contact precautions. Unvaccinated   Likely depression: severity unknown. Recently separated from wife & living a shelter. Continue on citalopram (started on 08/07/20)    AKI:  resolved   Discharge Instructions  Discharge Instructions    Diet - low sodium heart healthy   Complete by: As directed    Diet Carb Modified   Complete by: As directed    Discharge instructions   Complete by: As directed    F/u w/ PCP in 1-2 weeks   Increase activity slowly   Complete by: As directed      Allergies as of 08/10/2020      Reactions   Ace Inhibitors Cough   cough      Medication List    STOP taking these medications   traMADol 50 MG tablet Commonly known as: Ultram     TAKE these medications   atorvastatin 40 MG tablet Commonly known as: LIPITOR Take 1 tablet (40 mg total) by mouth every evening.   citalopram 20 MG tablet Commonly known as: CELEXA Take 1 tablet (20 mg total) by mouth daily. Start taking on: Aug 11, 2020   gabapentin 300 MG capsule Commonly known as: NEURONTIN Take 900 mg by mouth 2 (two) times daily.   hydrochlorothiazide 25 MG tablet Commonly known as: HYDRODIURIL Take 1 tablet (25 mg total) by mouth daily.   insulin glargine 100 unit/mL Sopn Commonly known as: LANTUS Inject 20 Units into the skin 2 (two) times daily.   lidocaine 5 % Commonly known as: Lidoderm Place 1 patch onto the skin every 12 (twelve) hours. Remove & Discard patch within 12 hours or as directed by MD   oxyCODONE-acetaminophen 7.5-325 MG tablet Commonly known as: PERCOCET Take 1 tablet by mouth every 8 (eight) hours as needed for up to 5 days for moderate pain.            Durable Medical Equipment  (From admission, onward)         Start     Ordered   08/10/20 1245  For home use only DME Walker rolling  Once       Question Answer Comment  Walker: With 5 Inch Wheels   Patient needs a walker to treat with the following condition Generalized weakness      08/10/20 1244          Allergies  Allergen Reactions  . Ace Inhibitors Cough    cough    Consultations:     Procedures/Studies: DG Ribs Unilateral W/Chest Left  Result  Date: 08/06/2020 CLINICAL DATA:  more pain after fall tonight in same area as previous pain EXAM: LEFT RIBS AND CHEST - 3+ VIEW COMPARISON:  Aug 05, 2020. FINDINGS: No displaced fracture or other bone lesions are seen involving the ribs. There is no evidence of pneumothorax or pleural effusion. Both lungs are clear. Heart size and mediastinal contours are within normal limits. IMPRESSION: No acute findings. Electronically Signed   By: Maudry Mayhew MD   On: 08/06/2020 02:43   DG Ribs Unilateral W/Chest Left  Result Date: 08/05/2020 CLINICAL DATA:  Fall, left lateral rib pain, initial encounter. EXAM: LEFT RIBS AND CHEST - 3+ VIEW COMPARISON:  Chest radiograph 10/12/2019. FINDINGS: Trachea is midline. Heart size stable. Lungs are clear. No pleural fluid. Dedicated views of the left ribs show no definite acute fracture. IMPRESSION: No acute findings. Electronically Signed  By: Leanna Battles M.D.   On: 08/05/2020 10:37   CT Head Wo Contrast  Result Date: 08/06/2020 CLINICAL DATA:  Fall EXAM: CT HEAD WITHOUT CONTRAST CT CERVICAL SPINE WITHOUT CONTRAST TECHNIQUE: Multidetector CT imaging of the head and cervical spine was performed following the standard protocol without intravenous contrast. Multiplanar CT image reconstructions of the cervical spine were also generated. COMPARISON:  None. FINDINGS: CT HEAD FINDINGS Brain: There is no mass, hemorrhage or extra-axial collection. The size and configuration of the ventricles and extra-axial CSF spaces are normal. The brain parenchyma is normal, without evidence of acute or chronic infarction. Vascular: No abnormal hyperdensity of the major intracranial arteries or dural venous sinuses. No intracranial atherosclerosis. Skull: The visualized skull base, calvarium and extracranial soft tissues are normal. Sinuses/Orbits: No fluid levels or advanced mucosal thickening of the visualized paranasal sinuses. No mastoid or middle ear effusion. The orbits are normal. CT  CERVICAL SPINE FINDINGS Alignment: No static subluxation. Facets are aligned. Occipital condyles are normally positioned. Skull base and vertebrae: No acute fracture. Soft tissues and spinal canal: No prevertebral fluid or swelling. No visible canal hematoma. Disc levels: No advanced spinal canal or neural foraminal stenosis. Upper chest: No pneumothorax, pulmonary nodule or pleural effusion. Other: Normal visualized paraspinal cervical soft tissues. IMPRESSION: 1. No acute intracranial abnormality. 2. No acute fracture or static subluxation of the cervical spine. Electronically Signed   By: Deatra Robinson M.D.   On: 08/06/2020 02:31   CT Cervical Spine Wo Contrast  Result Date: 08/06/2020 CLINICAL DATA:  Fall EXAM: CT HEAD WITHOUT CONTRAST CT CERVICAL SPINE WITHOUT CONTRAST TECHNIQUE: Multidetector CT imaging of the head and cervical spine was performed following the standard protocol without intravenous contrast. Multiplanar CT image reconstructions of the cervical spine were also generated. COMPARISON:  None. FINDINGS: CT HEAD FINDINGS Brain: There is no mass, hemorrhage or extra-axial collection. The size and configuration of the ventricles and extra-axial CSF spaces are normal. The brain parenchyma is normal, without evidence of acute or chronic infarction. Vascular: No abnormal hyperdensity of the major intracranial arteries or dural venous sinuses. No intracranial atherosclerosis. Skull: The visualized skull base, calvarium and extracranial soft tissues are normal. Sinuses/Orbits: No fluid levels or advanced mucosal thickening of the visualized paranasal sinuses. No mastoid or middle ear effusion. The orbits are normal. CT CERVICAL SPINE FINDINGS Alignment: No static subluxation. Facets are aligned. Occipital condyles are normally positioned. Skull base and vertebrae: No acute fracture. Soft tissues and spinal canal: No prevertebral fluid or swelling. No visible canal hematoma. Disc levels: No advanced  spinal canal or neural foraminal stenosis. Upper chest: No pneumothorax, pulmonary nodule or pleural effusion. Other: Normal visualized paraspinal cervical soft tissues. IMPRESSION: 1. No acute intracranial abnormality. 2. No acute fracture or static subluxation of the cervical spine. Electronically Signed   By: Deatra Robinson M.D.   On: 08/06/2020 02:31   US Carotid Bilateral  Result Date: 08/06/2020 CLINICAL DATA:  Syncope EXAM: BILATERAL CAROTID DUPLEX ULTRASOUND TECHNIQUE: Wallace Cullens scale imaging, color Doppler and duplex ultrasound were performed of bilateral carotid and vertebral arteries in the neck. COMPARISON:  None. FINDINGS: Criteria: Quantification of carotid stenosis is based on velocity parameters that correlate the residual internal carotid diameter with NASCET-based stenosis levels, using the diameter of the distal internal carotid lumen as the denominator for stenosis measurement. The following velocity measurements were obtained: RIGHT ICA: 71/19 cm/sec CCA: 53/12 cm/sec SYSTOLIC ICA/CCA RATIO:  1.3 ECA:  70 cm/sec LEFT ICA: 62/13 cm/sec CCA: 50/10 cm/sec  SYSTOLIC ICA/CCA RATIO:  1.2 ECA:  78 cm/sec RIGHT CAROTID ARTERY: Trace smooth heterogeneous atherosclerotic plaque without evidence of significant stenosis. RIGHT VERTEBRAL ARTERY:  Patent with normal antegrade flow. LEFT CAROTID ARTERY: Trace smooth heterogeneous atherosclerotic plaque in the proximal internal carotid artery without evidence of stenosis. LEFT VERTEBRAL ARTERY: Vertebral artery is patent with normal antegrade flow. IMPRESSION: 1. Trace smooth heterogeneous atherosclerotic plaque bilaterally without evidence of internal carotid artery stenosis. 2. Vertebral arteries are patent with normal antegrade flow. Signed, Sterling Big, MD, RPVI [facility] Electronically Signed   By: Malachy Moan M.D.   On: 08/06/2020 12:14   ECHOCARDIOGRAM COMPLETE  Result Date:  08/08/2020    ECHOCARDIOGRAM REPORT   Patient Name:   Steve Alvarado Date of Exam: 08/07/2020 Medical Rec #:  [id number]         Height:       74.0 in Accession #:    [id number]        Weight:       185.0 lb Date of Birth:  12-21-1955         BSA:          2.103 m Patient Age:    65 years          BP:           136/79 mmHg Patient Gender: M                 HR:           63 bpm. Exam Location:  [facility] Procedure: 2D Echo, Cardiac Doppler and Color Doppler Indications:     Syncope R55  History:         Patient has no prior history of Echocardiogram examinations.                  Risk Factors:Hypertension and Dyslipidemia.  Sonographer:     Cristela Blue RDCS (AE) Referring Phys:  [id number] Vernetta Honey MANSY Diagnosing Phys: Alwyn Pea MD IMPRESSIONS  1. Left ventricular ejection fraction, by estimation, is 55 to 60%. The left ventricle has normal function. The left ventricle has no regional wall motion abnormalities. Left ventricular diastolic parameters were normal.  2. Right ventricular systolic function is normal. The right ventricular size is normal.  3. Left atrial size was mild to moderately dilated.  4. Right atrial size was mild to moderately dilated.  5. The mitral valve is normal in structure. No evidence of mitral valve regurgitation.  6. The aortic valve is normal in structure. Aortic valve regurgitation is not visualized. FINDINGS  Left Ventricle: Left ventricular ejection fraction, by estimation, is 55 to 60%. The left ventricle has normal function. The left ventricle has no regional wall motion abnormalities. The left ventricular internal cavity size was normal in size. There is  no left ventricular hypertrophy. Left ventricular diastolic parameters were normal. Right Ventricle: The right ventricular size is normal. No increase in right ventricular wall thickness. Right ventricular systolic function is normal. Left Atrium: Left atrial size was mild to moderately dilated. Right Atrium: Right atrial size was  mild to moderately dilated. Pericardium: There is no evidence of pericardial effusion. Mitral Valve: The mitral valve is normal in structure. No evidence of mitral valve regurgitation. Tricuspid Valve: The tricuspid valve is normal in structure. Tricuspid valve regurgitation is trivial. Aortic Valve: The aortic valve is normal in structure. Aortic valve regurgitation is not visualized. Aortic valve mean gradient measures 3.0 mmHg.  Aortic valve peak gradient measures 5.0 mmHg. Aortic valve area, by VTI measures 2.62 cm. Pulmonic Valve: The pulmonic valve was normal in structure. Pulmonic valve regurgitation is not visualized. Aorta: The ascending aorta was not well visualized. IAS/Shunts: No atrial level shunt detected by color flow Doppler.  LEFT VENTRICLE PLAX 2D LVIDd:         4.56 cm  Diastology LVIDs:         3.16 cm  LV e' medial:    4.24 cm/s LV PW:         1.19 cm  LV E/e' medial:  14.3 LV IVS:        1.10 cm  LV e' lateral:   4.57 cm/s LVOT diam:     2.20 cm  LV E/e' lateral: 13.3 LV SV:         52 LV SV Index:   25 LVOT Area:     3.80 cm  LEFT ATRIUM              Index       RIGHT ATRIUM           Index LA diam:        3.50 cm  1.66 cm/m  RA Area:     14.00 cm LA Vol (A2C):   119.0 ml 56.60 ml/m RA Volume:   31.60 ml  15.03 ml/m LA Vol (A4C):   50.4 ml  23.97 ml/m LA Biplane Vol: 85.5 ml  40.66 ml/m  AORTIC VALVE                   PULMONIC VALVE AV Area (Vmax):    2.50 cm    PV Vmax:        0.41 m/s AV Area (Vmean):   2.45 cm    PV Peak grad:   0.7 mmHg AV Area (VTI):     2.62 cm    RVOT Peak grad: 2 mmHg AV Vmax:           112.00 cm/s AV Vmean:          72.000 cm/s AV VTI:            0.197 m AV Peak Grad:      5.0 mmHg AV Mean Grad:      3.0 mmHg LVOT Vmax:         73.80 cm/s LVOT Vmean:        46.400 cm/s LVOT VTI:          0.136 m LVOT/AV VTI ratio: 0.69  AORTA Ao Root diam: 3.73 cm MITRAL VALVE               TRICUSPID VALVE MV Area (PHT): 3.26 cm    TR Peak grad:   9.4 mmHg MV Decel Time:  233 msec    TR Vmax:        153.00 cm/s MV E velocity: 60.80 cm/s MV A velocity: 93.40 cm/s  SHUNTS MV E/A ratio:  0.65        Systemic VTI:  0.14 m                            Systemic Diam: 2.20 cm Alwyn Pea MD Electronically signed by Alwyn Pea MD Signature Date/Time: 08/08/2020/11:34:00 AM    Final      Subjective: Pt c/o fatigue   Discharge Exam: Vitals:   08/10/20 0917 08/10/20 1147  BP: 133/75 Marland Kitchen)  153/78  Pulse: (!) 57 (!) 56  Resp: 17 19  Temp: 98 F (36.7 C) 98.4 F (36.9 C)  SpO2: 100% 100%   Vitals:   08/09/20 2324 08/10/20 0506 08/10/20 0917 08/10/20 1147  BP: (!) 103/58 119/70 133/75 (!) 153/78  Pulse: 67 (!) 59 (!) 57 (!) 56  Resp:  16 17 19   Temp:  97.7 F (36.5 C) 98 F (36.7 C) 98.4 F (36.9 C)  TempSrc:  Oral Oral   SpO2:  100% 100% 100%    General: Pt is alert, awake, not in acute distress Cardiovascular: S1/S2 +, no rubs, no gallops Respiratory: CTA bilaterally, no wheezing, no rhonchi Abdominal: Soft, NT, ND, bowel sounds + Extremities: no edema, no cyanosis    The results of significant diagnostics from this hospitalization (including imaging, microbiology, ancillary and laboratory) are listed below for reference.     Microbiology: Recent Results (from the past 240 hour(s))  Resp Panel by RT-PCR (Flu A&B, Covid) Nasopharyngeal Swab     Status: Abnormal   Collection Time: 08/06/20  4:52 AM   Specimen: Nasopharyngeal Swab; Nasopharyngeal(NP) swabs in vial transport medium  Result Value Ref Range Status   SARS Coronavirus 2 by RT PCR POSITIVE (A) NEGATIVE Final    Comment: RESULT CALLED TO, READ BACK BY AND VERIFIED WITH: EMILY GORMAN@0700  ON 08/06/20 BY HKP (NOTE) SARS-CoV-2 target nucleic acids are NOT DETECTED.  The SARS-CoV-2 RNA is generally detectable in upper respiratory specimens during the acute phase of infection. The lowest concentration of SARS-CoV-2 viral copies this assay can detect is 138 copies/mL. A negative  result does not preclude SARS-Cov-2 infection and should not be used as the sole basis for treatment or other patient management decisions. A negative result may occur with  improper specimen collection/handling, submission of specimen other than nasopharyngeal swab, presence of viral mutation(s) within the areas targeted by this assay, and inadequate number of viral copies(<138 copies/mL). A negative result must be combined with clinical observations, patient history, and epidemiological information. The expected result is Negative.  Fact Sheet for Patients:  BloggerCourse.comhttps://www.fda.gov/media/152166/download  Fact Sheet  for Healthcare Providers:  SeriousBroker.ithttps://www.fda.gov/media/152162/download  This test is not yet approved or cleared by the [place] FDA and  has been authorized for detection and/or diagnosis of SARS-CoV-2 by FDA under an Emergency Use Authorization (EUA). This EUA will remain  in effect (meaning this test can be used) for the duration of the COVID-19 declaration under Section 564(b)(1) of the Act, 21 U.S.C.section 360bbb-3(b)(1), unless the authorization is terminated  or revoked sooner.       Influenza A by PCR NEGATIVE NEGATIVE Final   Influenza B by PCR NEGATIVE NEGATIVE Final    Comment: (NOTE) The Xpert Xpress SARS-CoV-2/FLU/RSV plus assay is intended as an aid in the diagnosis of influenza from Nasopharyngeal swab specimens and should not be used as a sole basis for treatment. Nasal washings and aspirates are unacceptable for Xpert Xpress SARS-CoV-2/FLU/RSV testing.  Fact Sheet for Patients: BloggerCourse.comhttps://www.fda.gov/media/152166/download  Fact Sheet for Healthcare Providers: SeriousBroker.ithttps://www.fda.gov/media/152162/download  This test is not yet approved or cleared by the [place] FDA and has been authorized for detection and/or diagnosis of SARS-CoV-2 by FDA under an Emergency Use Authorization (EUA). This EUA will remain in effect (meaning this test can be used)  for the duration of the COVID-19 declaration under Section 564(b)(1) of the Act, 21 U.S.C. section 360bbb-3(b)(1), unless the authorization is terminated or revoked.  Performed at [facility], [address] [place], [place]  [address]      Labs: BNP (last 3 results) No results for input(s): BNP in the last 8760 hours. Basic Metabolic Panel: Recent Labs  Lab 08/06/20 0452 08/07/20 0551 08/08/20 0438 08/09/20 0458 08/10/20 0537  NA 131* 133* 134* 137 138  K 4.3 3.9 4.5 3.9 4.2  CL 99 102 108 108 109  CO2 GLUCOSE 373* 230* 170* 225* 94  BUN 20 32* 28* 20 23  CREATININE 1.06 1.48* 1.15 0.93 0.86  CALCIUM 8.9 8.6* 8.9 8.6* 8.7*   Liver Function Tests: Recent Labs  Lab 08/06/20 0141  AST 27  ALT 22  ALKPHOS 73  BILITOT 1.5*  PROT 7.7  ALBUMIN 3.8   Recent Labs  Lab 08/06/20 0141  LIPASE 33   No results for input(s): AMMONIA in the last 168 hours. CBC: Recent Labs  Lab 08/06/20 0035 08/06/20 0452 08/07/20 0551 08/08/20 0438 08/09/20 0458 08/10/20 0537  WBC 7.1 7.2 5.1 6.2 4.5 5.4  NEUTROABS 4.9  --   --   --   --   --   HGB 14.4 14.3 12.6* 12.7* 11.4* 11.9*  HCT 40.0 40.1 35.4* 35.9* 32.7* 33.2*  MCV 83.9 84.8 85.3 85.1 85.8 85.3  PLT 219 227 205 198 202 210   Cardiac Enzymes: No results for input(s): CKTOTAL, CKMB, CKMBINDEX, TROPONINI in the last 168 hours. BNP: Invalid input(s): POCBNP CBG: Recent Labs  Lab 08/09/20 1653 08/09/20 1721 08/09/20 2038 08/10/20 0732 08/10/20 1145  GLUCAP 69* 79 161* 98 185*   D-Dimer No results for input(s): DDIMER in the last 72 hours. Hgb A1c No results for input(s): HGBA1C in the last 72 hours. Lipid Profile No results for input(s): CHOL, HDL, LDLCALC, TRIG, CHOLHDL, LDLDIRECT in the last 72 hours. Thyroid function studies No results for input(s): TSH, T4TOTAL, T3FREE, THYROIDAB in the last 72 hours.  Invalid input(s): FREET3 Anemia work up No results for input(s):  VITAMINB12, FOLATE, FERRITIN, TIBC, IRON, RETICCTPCT in the last 72 hours. Urinalysis    Component Value Date/Time   COLORURINE STRAW (A) 08/06/2020 0246   APPEARANCEUR CLEAR (A) 08/06/2020 0246   APPEARANCEUR Clear 08/22/2013 0057   LABSPEC 1.025 08/06/2020 0246   LABSPEC 1.017 08/22/2013 0057   PHURINE 5.0 08/06/2020 0246   GLUCOSEU >=500 (A) 08/06/2020 0246   GLUCOSEU >=500 08/22/2013 0057   HGBUR SMALL (A) 08/06/2020 0246   BILIRUBINUR NEGATIVE 08/06/2020 0246   BILIRUBINUR Negative 08/22/2013 0057   KETONESUR 5 (A) 08/06/2020 0246   PROTEINUR 30 (A) 08/06/2020 0246   NITRITE NEGATIVE 08/06/2020 0246   LEUKOCYTESUR NEGATIVE 08/06/2020 0246   LEUKOCYTESUR Negative 08/22/2013 0057   Sepsis Labs Invalid input(s): PROCALCITONIN,  WBC,  LACTICIDVEN Microbiology Recent Results (from the past 240 hour(s))  Resp Panel by RT-PCR (Flu A&B, Covid) Nasopharyngeal Swab     Status: Abnormal   Collection Time: 08/06/20  4:52 AM   Specimen: Nasopharyngeal Swab; Nasopharyngeal(NP) swabs in vial transport medium  Result Value Ref Range Status   SARS Coronavirus 2 by RT PCR POSITIVE (A) NEGATIVE Final    Comment: RESULT CALLED TO, READ BACK BY AND VERIFIED WITH: EMILY GORMAN@0700  ON 08/06/20 BY HKP (NOTE) SARS-CoV-2 target nucleic acids are NOT DETECTED.  The SARS-CoV-2 RNA is generally detectable in upper respiratory specimens during the acute phase of infection. The lowest concentration of SARS-CoV-2 viral copies this assay can detect is 138 copies/mL. A negative result does not preclude SARS-Cov-2 infection and should not be used as the sole  basis for treatment or other patient management decisions. A negative result may occur with  improper specimen collection/handling, submission of specimen other than nasopharyngeal swab, presence of viral mutation(s) within the areas targeted by this assay, and inadequate number of viral copies(<138 copies/mL). A negative result must be combined  with clinical observations, patient history, and epidemiological information. The expected result is Negative.  Fact Sheet for Patients:  BloggerCourse.com  Fact Sheet  for Healthcare Providers:  SeriousBroker.it  This test is not yet approved or cleared by the [place] FDA and  has been authorized for detection and/or diagnosis of SARS-CoV-2 by FDA under an Emergency Use Authorization (EUA). This EUA will remain  in effect (meaning this test can be used) for the duration of the COVID-19 declaration under Section 564(b)(1) of the Act, 21 U.S.C.section 360bbb-3(b)(1), unless the authorization is terminated  or revoked sooner.       Influenza A by PCR NEGATIVE NEGATIVE Final   Influenza B by PCR NEGATIVE NEGATIVE Final    Comment: (NOTE) The Xpert Xpress SARS-CoV-2/FLU/RSV plus assay is intended as an aid in the diagnosis of influenza from Nasopharyngeal swab specimens and should not be used as a sole basis for treatment. Nasal washings and aspirates are unacceptable for Xpert Xpress SARS-CoV-2/FLU/RSV testing.  Fact Sheet for Patients: BloggerCourse.com  Fact Sheet for Healthcare Providers: SeriousBroker.it  This test is not yet approved or cleared by the [place] FDA and has been authorized for detection and/or diagnosis of SARS-CoV-2 by FDA under an Emergency Use Authorization (EUA). This EUA will remain in effect (meaning this test can be used) for the duration of the COVID-19 declaration under Section 564(b)(1) of the Act, 21 U.S.C. section 360bbb-3(b)(1), unless the authorization is terminated or revoked.  Performed at [facility], [address], [place], [place] [address]      Time coordinating discharge: Over 30 minutes  SIGNED:   Charise Killian, MD  [facility] 08/10/2020, 1:04 PM Pager   If 7PM-7AM, please contact  night-coverage

## 2020-09-02 ENCOUNTER — Emergency Department: Payer: Medicare Other

## 2020-09-02 ENCOUNTER — Other Ambulatory Visit: Payer: Self-pay

## 2020-09-02 ENCOUNTER — Emergency Department
Admission: EM | Admit: 2020-09-02 | Discharge: 2020-09-03 | Disposition: A | Discharge status: Other Acute Inpatient Hospital | Payer: Medicare Other | Attending: Emergency Medicine | Admitting: Emergency Medicine

## 2020-09-02 DIAGNOSIS — F332 Major depressive disorder, recurrent severe without psychotic features: Secondary | ICD-10-CM | POA: Insufficient documentation

## 2020-09-02 DIAGNOSIS — R45851 Suicidal ideations: Secondary | ICD-10-CM | POA: Diagnosis not present

## 2020-09-02 DIAGNOSIS — Z794 Long term (current) use of insulin: Secondary | ICD-10-CM | POA: Insufficient documentation

## 2020-09-02 DIAGNOSIS — E119 Type 2 diabetes mellitus without complications: Secondary | ICD-10-CM | POA: Diagnosis not present

## 2020-09-02 DIAGNOSIS — F1410 Cocaine abuse, uncomplicated: Secondary | ICD-10-CM | POA: Insufficient documentation

## 2020-09-02 DIAGNOSIS — R109 Unspecified abdominal pain: Secondary | ICD-10-CM | POA: Insufficient documentation

## 2020-09-02 DIAGNOSIS — W19XXXA Unspecified fall, initial encounter: Secondary | ICD-10-CM | POA: Diagnosis not present

## 2020-09-02 DIAGNOSIS — Z79899 Other long term (current) drug therapy: Secondary | ICD-10-CM | POA: Insufficient documentation

## 2020-09-02 DIAGNOSIS — R55 Syncope and collapse: Secondary | ICD-10-CM | POA: Insufficient documentation

## 2020-09-02 DIAGNOSIS — R296 Repeated falls: Secondary | ICD-10-CM | POA: Diagnosis not present

## 2020-09-02 DIAGNOSIS — R197 Diarrhea, unspecified: Secondary | ICD-10-CM | POA: Diagnosis not present

## 2020-09-02 DIAGNOSIS — R2689 Other abnormalities of gait and mobility: Secondary | ICD-10-CM | POA: Diagnosis not present

## 2020-09-02 DIAGNOSIS — K529 Noninfective gastroenteritis and colitis, unspecified: Secondary | ICD-10-CM

## 2020-09-02 DIAGNOSIS — F322 Major depressive disorder, single episode, severe without psychotic features: Secondary | ICD-10-CM | POA: Diagnosis not present

## 2020-09-02 DIAGNOSIS — Z20822 Contact with and (suspected) exposure to covid-19: Secondary | ICD-10-CM | POA: Diagnosis not present

## 2020-09-02 DIAGNOSIS — I10 Essential (primary) hypertension: Secondary | ICD-10-CM | POA: Diagnosis not present

## 2020-09-02 DIAGNOSIS — I2510 Atherosclerotic heart disease of native coronary artery without angina pectoris: Secondary | ICD-10-CM | POA: Diagnosis not present

## 2020-09-02 DIAGNOSIS — F321 Major depressive disorder, single episode, moderate: Secondary | ICD-10-CM

## 2020-09-02 DIAGNOSIS — F32A Depression, unspecified: Secondary | ICD-10-CM | POA: Diagnosis present

## 2020-09-02 DIAGNOSIS — R519 Headache, unspecified: Secondary | ICD-10-CM | POA: Diagnosis not present

## 2020-09-02 LAB — CBC
HCT: 35.9 % — ABNORMAL LOW (ref 39.0–52.0)
Hemoglobin: 12.4 g/dL — ABNORMAL LOW (ref 13.0–17.0)
MCH: 30.1 pg (ref 26.0–34.0)
MCHC: 34.5 g/dL (ref 30.0–36.0)
MCV: 87.1 fL (ref 80.0–100.0)
Platelets: 257 10*3/uL (ref 150–400)
RBC: 4.12 MIL/uL — ABNORMAL LOW (ref 4.22–5.81)
RDW: 12.1 % (ref 11.5–15.5)
WBC: 7.2 10*3/uL (ref 4.0–10.5)
nRBC: 0 % (ref 0.0–0.2)

## 2020-09-02 LAB — RESP PANEL BY RT-PCR (FLU A&B, COVID) ARPGX2
Influenza A by PCR: NEGATIVE
Influenza B by PCR: NEGATIVE
SARS Coronavirus 2 by RT PCR: NEGATIVE

## 2020-09-02 LAB — COMPREHENSIVE METABOLIC PANEL
ALT: 25 U/L (ref 0–44)
AST: 25 U/L (ref 15–41)
Albumin: 4 g/dL (ref 3.5–5.0)
Alkaline Phosphatase: 70 U/L (ref 38–126)
Anion gap: 10 (ref 5–15)
BUN: 17 mg/dL (ref 8–23)
CO2: 23 mmol/L (ref 22–32)
Calcium: 9.2 mg/dL (ref 8.9–10.3)
Chloride: 100 mmol/L (ref 98–111)
Creatinine, Ser: 1.05 mg/dL (ref 0.61–1.24)
GFR, Estimated: >60 mL/min (ref >60)
Glucose, Bld: 262 mg/dL — ABNORMAL HIGH (ref 70–99)
Potassium: 3.6 mmol/L (ref 3.5–5.1)
Sodium: 133 mmol/L — ABNORMAL LOW (ref 135–145)
Total Bilirubin: 1.2 mg/dL (ref 0.3–1.2)
Total Protein: 7.5 g/dL (ref 6.5–8.1)

## 2020-09-02 LAB — SALICYLATE LEVEL: Salicylate Lvl: <7 mg/dL — ABNORMAL LOW (ref 7.0–30.0)

## 2020-09-02 LAB — ACETAMINOPHEN LEVEL: Acetaminophen (Tylenol), Serum: <10 ug/mL — ABNORMAL LOW (ref 10–30)

## 2020-09-02 LAB — CBG MONITORING, ED
Glucose-Capillary: 241 mg/dL — ABNORMAL HIGH (ref 70–99)
Glucose-Capillary: 183 mg/dL — ABNORMAL HIGH (ref 70–99)

## 2020-09-02 LAB — POC SARS CORONAVIRUS 2 AG -  ED: SARSCOV2ONAVIRUS 2 AG: NEGATIVE

## 2020-09-02 LAB — ETHANOL: Alcohol, Ethyl (B): <10 mg/dL (ref <10)

## 2020-09-02 IMAGING — CT CT HEAD W/O CM
3 series · 15 of 47 positions shown, 18 images · non-contrast
Comparison: CT head [DATE]

CLINICAL DATA: Fall, head trauma

EXAM:
CT HEAD WITHOUT CONTRAST
TECHNIQUE: Contiguous axial images were obtained from the base of the skull
through the vertex without intravenous contrast.

[Series 2: head wo · axial · 0.45mm/px · z∈[-180,-45]mm · 9 of 33 slices shown, 12 images]
[im 3/33  brain]
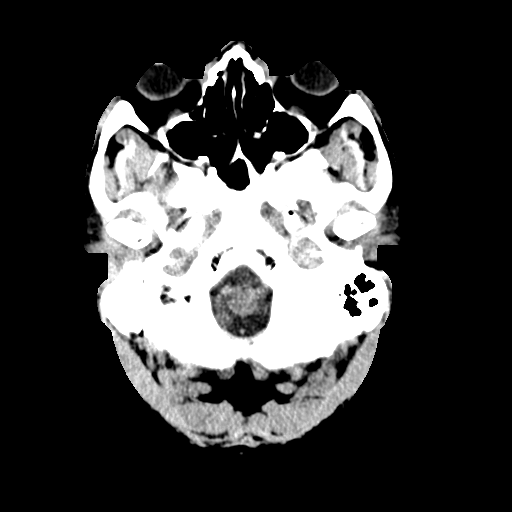
[im 3/33  bone]
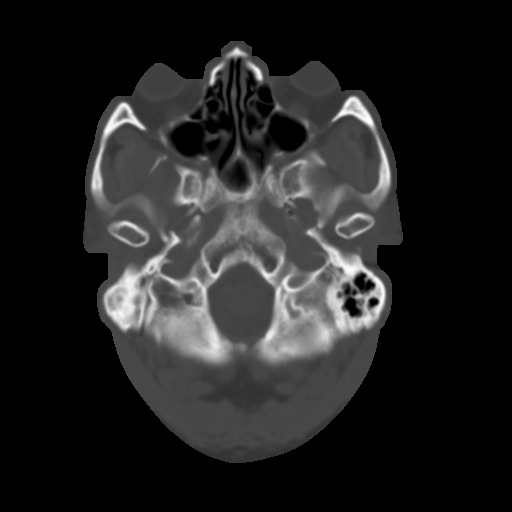
[im 6/33  brain]
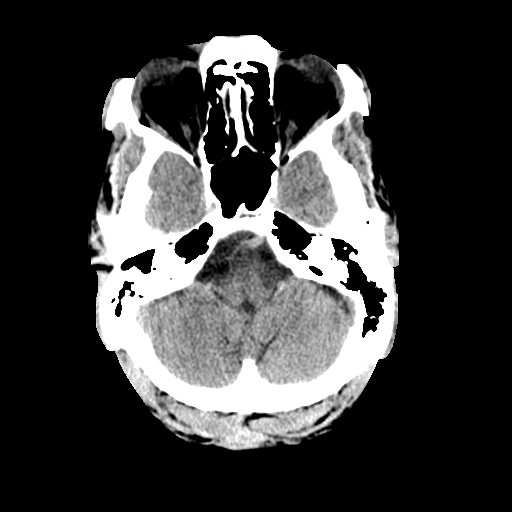
[im 9/33  brain]
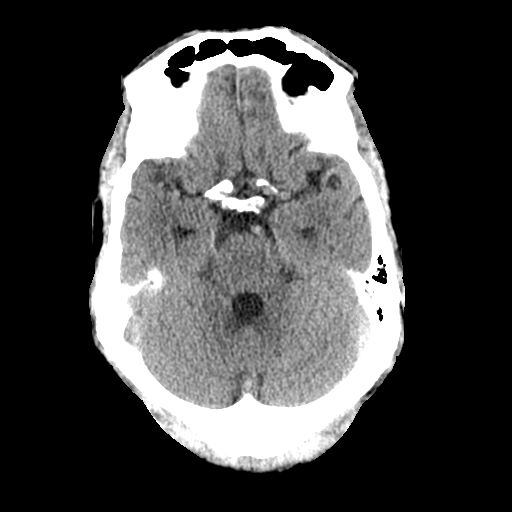
[im 13/33  brain]
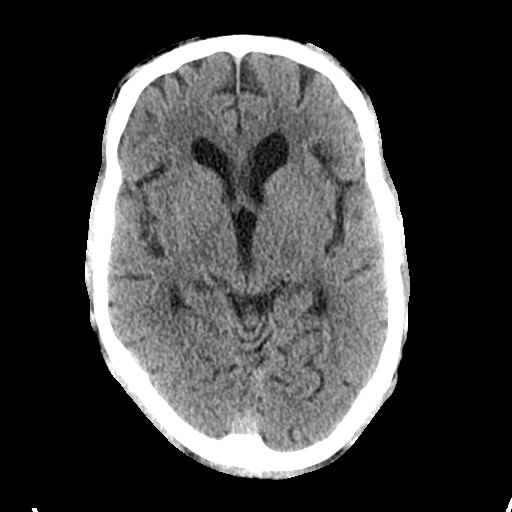
[im 17/33  brain]
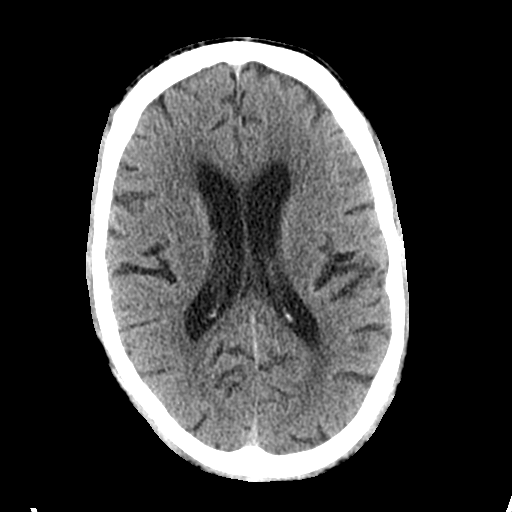
[im 17/33  bone]
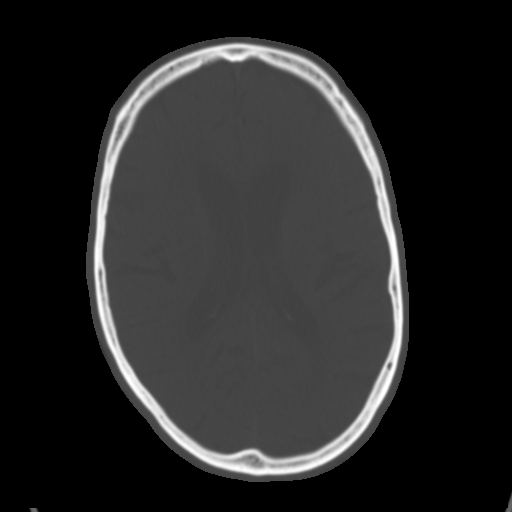
[im 20/33  brain]
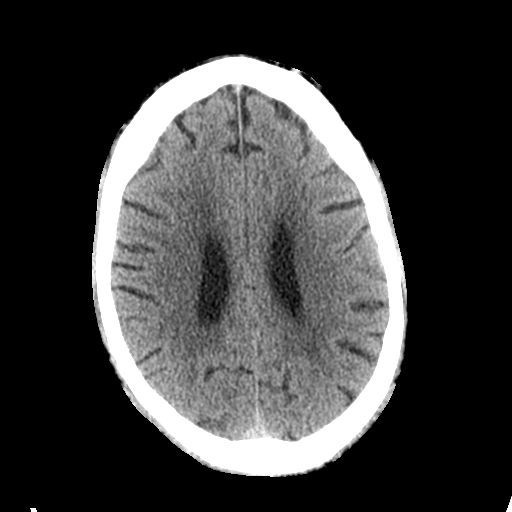
[im 24/33  brain]
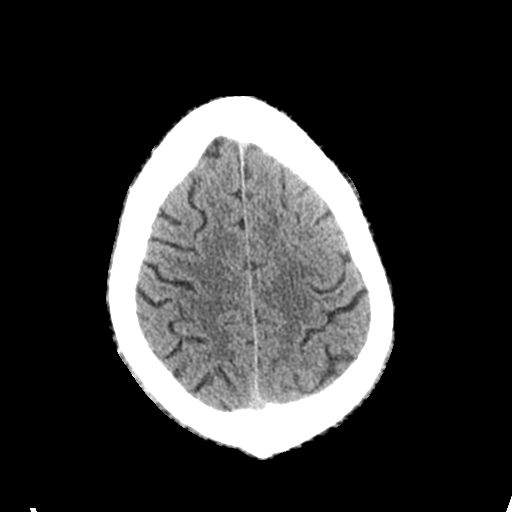
[im 27/33  brain]
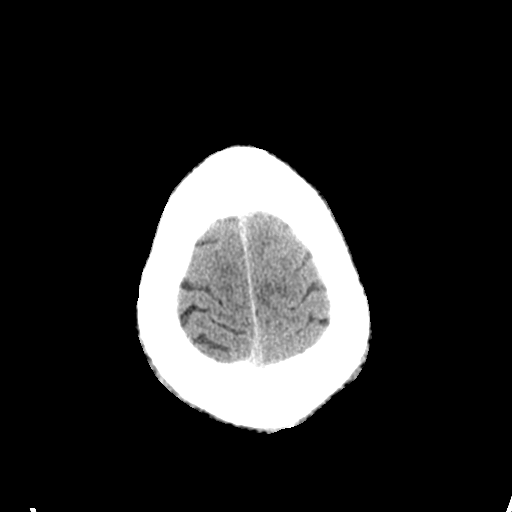
[im 30/33  brain]
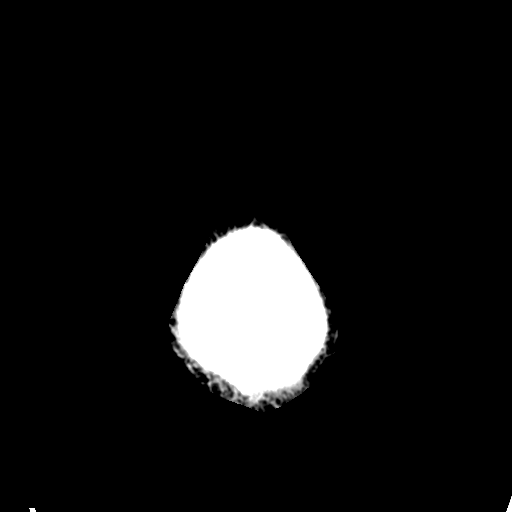
[im 30/33  bone]
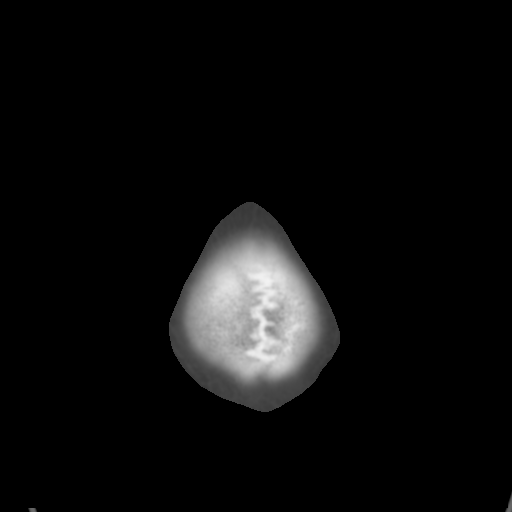

[Series 4: coronal soft tissue · coronal · 0.33mm/px · 3 of 75 slices shown]
[im 25/75  brain]
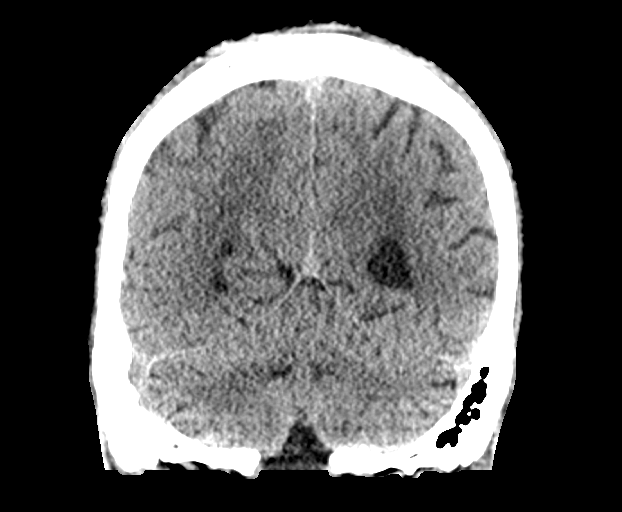
[im 33/75  brain]
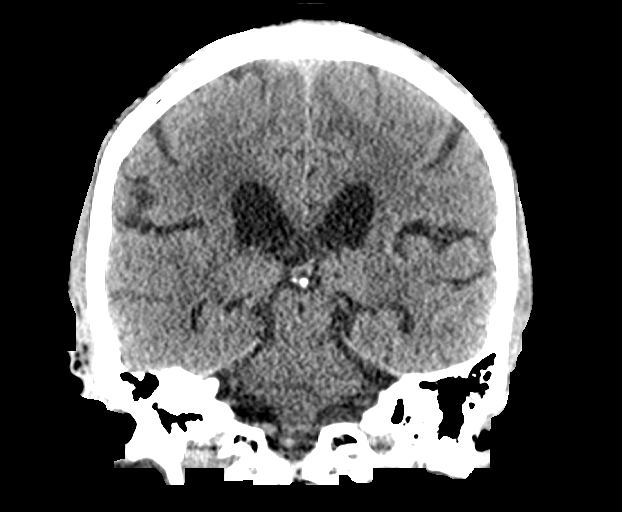
[im 42/75  brain]
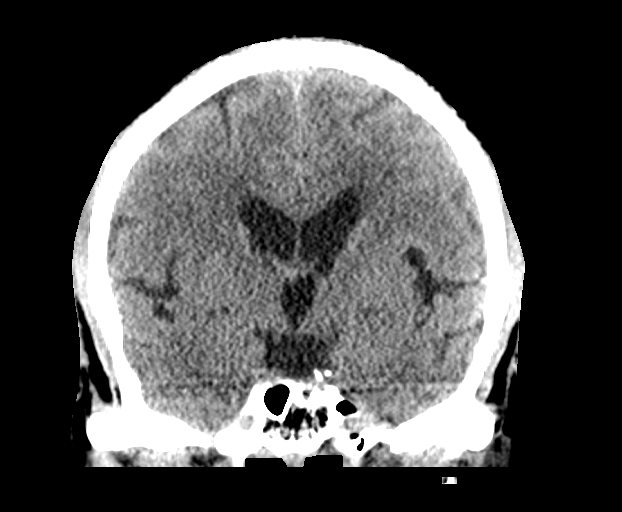

[Series 5: sagittal soft tissue · sagittal · 0.33mm/px · 3 of 58 slices shown]
[im 20/58  brain]
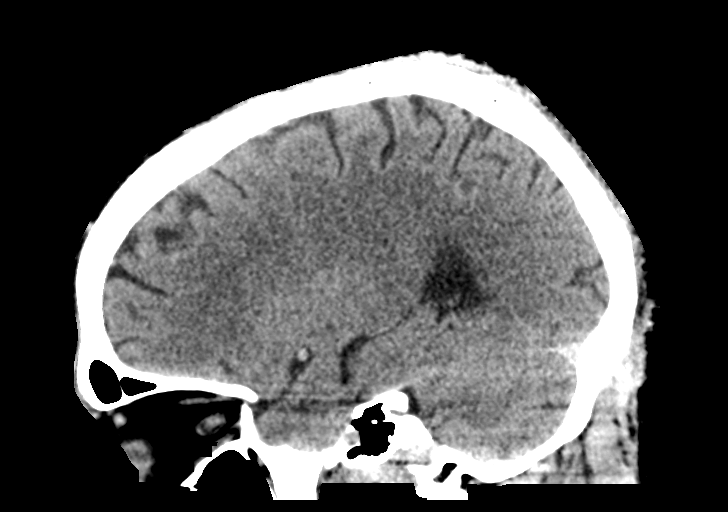
[im 29/58  brain]
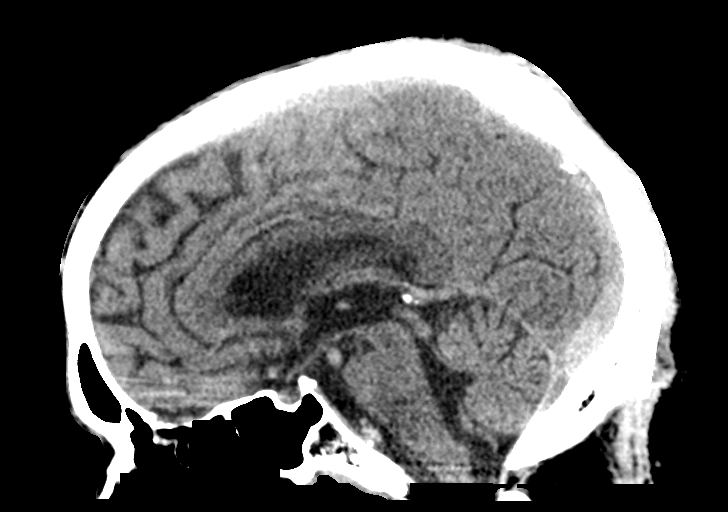
[im 39/58  brain]
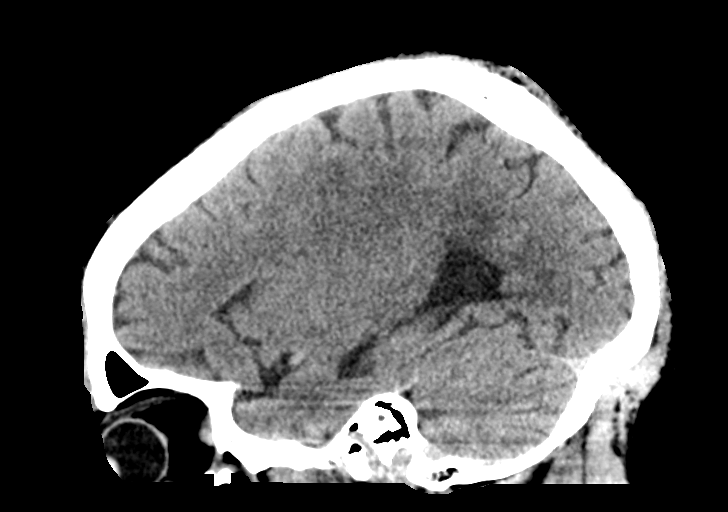

[15 of 47 positions shown; findings below may reference images not displayed]

FINDINGS: Brain: Mild atrophy. Mild white matter changes bilaterally, chronic
and unchanged

Negative for acute infarct, hemorrhage, mass.

Vascular: Arterial calcification in the carotid and vertebral
arteries. Negative for hyperdense vessel

Skull: Negative

Sinuses/Orbits: Paranasal sinuses clear.  Negative orbit

Other: None
IMPRESSION: No acute abnormality no change from the prior CT.

## 2020-09-02 MED ORDER — MIRTAZAPINE 15 MG PO TABS
7.5000 mg | ORAL_TABLET | Freq: Every day | ORAL | Status: DC
  Administered 2020-09-02 – 2020-09-03 (×2): 7.5 mg via ORAL
  Filled 2020-09-02 (×2): qty 1

## 2020-09-02 MED ORDER — LIRAGLUTIDE 18 MG/3ML ~~LOC~~ SOPN: 1.2000 mg | PEN_INJECTOR | Freq: Every morning | SUBCUTANEOUS | Status: DC

## 2020-09-02 MED ORDER — LOSARTAN POTASSIUM 50 MG PO TABS
100.0000 mg | ORAL_TABLET | Freq: Every day | ORAL | Status: DC
  Administered 2020-09-02 – 2020-09-03 (×2): 100 mg via ORAL
  Filled 2020-09-02 (×3): qty 2

## 2020-09-02 MED ORDER — INSULIN ASPART 100 UNIT/ML IJ SOLN
0.0000 [IU] | Freq: Three times a day (TID) | INTRAMUSCULAR | Status: DC
  Administered 2020-09-03: 2 [IU] via SUBCUTANEOUS
  Filled 2020-09-02: qty 1

## 2020-09-02 MED ORDER — AMLODIPINE BESYLATE 5 MG PO TABS
10.0000 mg | ORAL_TABLET | Freq: Every day | ORAL | Status: DC
  Administered 2020-09-02 – 2020-09-03 (×2): 10 mg via ORAL
  Filled 2020-09-02 (×2): qty 2

## 2020-09-02 MED ORDER — CITALOPRAM HYDROBROMIDE 20 MG PO TABS
20.0000 mg | ORAL_TABLET | Freq: Every day | ORAL | Status: DC
  Administered 2020-09-02 – 2020-09-03 (×2): 20 mg via ORAL
  Filled 2020-09-02 (×2): qty 1

## 2020-09-02 MED ORDER — GABAPENTIN 300 MG PO CAPS
900.0000 mg | ORAL_CAPSULE | Freq: Two times a day (BID) | ORAL | Status: DC
  Administered 2020-09-02 – 2020-09-03 (×3): 900 mg via ORAL
  Filled 2020-09-02 (×3): qty 3

## 2020-09-02 MED ORDER — LEVOTHYROXINE SODIUM 50 MCG PO TABS
50.0000 ug | ORAL_TABLET | Freq: Every day | ORAL | Status: DC
  Administered 2020-09-03: 50 ug via ORAL
  Filled 2020-09-02: qty 1

## 2020-09-02 MED ORDER — INSULIN GLARGINE 100 UNIT/ML ~~LOC~~ SOLN
60.0000 [IU] | Freq: Two times a day (BID) | SUBCUTANEOUS | Status: DC
  Administered 2020-09-02: 60 [IU] via SUBCUTANEOUS
  Filled 2020-09-02 (×3): qty 0.6

## 2020-09-02 MED ORDER — HYDROCHLOROTHIAZIDE 25 MG PO TABS
25.0000 mg | ORAL_TABLET | Freq: Every day | ORAL | Status: DC
  Administered 2020-09-02 – 2020-09-03 (×2): 25 mg via ORAL
  Filled 2020-09-02 (×2): qty 1

## 2020-09-02 MED ORDER — ATORVASTATIN CALCIUM 20 MG PO TABS
40.0000 mg | ORAL_TABLET | Freq: Every evening | ORAL | Status: DC
  Administered 2020-09-02: 40 mg via ORAL
  Filled 2020-09-02: qty 2

## 2020-09-02 MED ORDER — INSULIN ASPART 100 UNIT/ML IJ SOLN: 0.0000 [IU] | Freq: Every day | INTRAMUSCULAR | Status: DC

## 2020-09-02 MED ORDER — INSULIN GLARGINE 100 UNIT/ML ~~LOC~~ SOLN
50.0000 [IU] | Freq: Two times a day (BID) | SUBCUTANEOUS | Status: DC
  Administered 2020-09-02 – 2020-09-03 (×2): 50 [IU] via SUBCUTANEOUS
  Filled 2020-09-02 (×3): qty 0.5

## 2020-09-02 MED ORDER — METFORMIN HCL 500 MG PO TABS
1000.0000 mg | ORAL_TABLET | Freq: Two times a day (BID) | ORAL | Status: DC
  Administered 2020-09-02 – 2020-09-03 (×3): 1000 mg via ORAL
  Filled 2020-09-02 (×3): qty 2

## 2020-09-02 MED ORDER — OXYCODONE-ACETAMINOPHEN 7.5-325 MG PO TABS: 1.0000 | ORAL_TABLET | Freq: Two times a day (BID) | ORAL | Status: DC | PRN

## 2020-09-02 NOTE — ED Notes (Signed)
Hourly rounding reveals patient in room. No complaints, stable, in no acute distress. Q15 minute rounds and monitoring via Rover and Officer to continue.   

## 2020-09-02 NOTE — ED Notes (Signed)
Dr. Katrinka Blazing at bedside to assess

## 2020-09-02 NOTE — ED Notes (Signed)
Pt taken to CT in wheelchair with NT, Mel.

## 2020-09-02 NOTE — ED Notes (Signed)
VOL  PT  GOING  TO  [facility]  IN  THE  AM 09/03/20

## 2020-09-02 NOTE — ED Provider Notes (Signed)
[facility] Emergency Department Provider Note   ____________________________________________   Event Date/Time   First MD Initiated Contact with Patient 09/02/20 0440     (approximate)  I have reviewed the triage vital signs and the nursing notes.   HISTORY  Chief Complaint Suicidal    HPI Steve Alvarado is a 65 y.o. male who presents to the ED from home voluntarily with a chief complaint of depression and feeling suicidal without plan.  Involved in an argument with his spouse yesterday; spouse is currently in the ED for behavioral evaluation as well.  Patient denies active SI/HI/AH/VH.  Voices no medical complaints.     Past Medical History:  Diagnosis Date  . Coronary artery disease   . Diabetes mellitus    Type II  . Hyperlipidemia   . Hypertension   . Thyroid disease     Patient Active Problem List   Diagnosis Date Noted  . Syncope 08/06/2020  . Intractable abdominal pain 03/08/2015  . Hypertensive urgency 03/08/2015  . Intractable nausea and vomiting 03/07/2015  . HTN (hypertension) 02/14/2011  . CAD (coronary artery disease) 08/13/2010  . Stented coronary artery 08/13/2010  . Diabetes mellitus (HCC) 08/13/2010  . Hyperlipemia 08/13/2010    Past Surgical History:  Procedure Laterality Date  . CARDIAC CATHETERIZATION  08/06/2010   Bare metal stent placed in RCA.  Marland Kitchen HAND SURGERY     left    Prior to Admission medications   Medication Sig Start Date End Date Taking? Authorizing Provider  atorvastatin (LIPITOR) 40 MG tablet Take 1 tablet (40 mg total) by mouth every evening. 08/10/20 09/09/20  Charise Killian, MD  citalopram (CELEXA) 20 MG tablet Take 1 tablet (20 mg total) by mouth daily. 08/11/20 09/10/20  Charise Killian, MD  gabapentin (NEURONTIN) 300 MG capsule Take 900 mg by mouth 2 (two) times daily. 07/24/20   [provider]  hydrochlorothiazide (HYDRODIURIL) 25 MG tablet Take 1 tablet (25 mg total) by mouth  daily. 08/10/20 09/09/20  Charise Killian, MD  insulin glargine (LANTUS) 100 unit/mL SOPN Inject 20 Units into the skin 2 (two) times daily.    [provider]  lidocaine (LIDODERM) 5 % Place 1 patch onto the skin every 12 (twelve) hours. Remove & Discard patch within 12 hours or as directed by MD 08/05/20 08/05/21  Joni Reining, PA-C    Allergies Ace inhibitors  Family History  Problem Relation Age of Onset  . Heart attack Mother     Social History Social History   Tobacco Use  . Smoking status: Never  . Smokeless tobacco: Never  Substance Use Topics  . Alcohol use: No  . Drug use: Not Currently    Review of Systems  Constitutional: No fever/chills Eyes: No visual changes. ENT: No sore throat. Cardiovascular: Denies chest pain. Respiratory: Denies shortness of breath. Gastrointestinal: No abdominal pain.  No nausea, no vomiting.  No diarrhea.  No constipation. Genitourinary: Negative for dysuria. Musculoskeletal: Negative for back pain. Skin: Negative for rash. Neurological: Negative for headaches, focal weakness or numbness. Psychiatric: Positive for depression.  ____________________________________________   PHYSICAL EXAM:  VITAL SIGNS: ED Triage Vitals  Enc Vitals Group     BP 09/02/20 0423 (!) 146/91     Pulse Rate 09/02/20 0423 83     Resp 09/02/20 0423 18     Temp 09/02/20 0423 98.2 F (36.8 C)     Temp Source 09/02/20 0423 Oral     SpO2 09/02/20  0423 98 %     Weight 09/02/20 0421 190 lb (86.2 kg)     Height 09/02/20 0421 6\' 2"  (1.88 m)     Head Circumference --      Peak Flow --      Pain Score 09/02/20 0420 10     Pain Loc --      Pain Edu? --      Excl. in GC? --     Constitutional: Alert and oriented. Well appearing and in no acute distress. Eyes: Conjunctivae are normal. PERRL. EOMI. Head: Atraumatic. Nose: No congestion/rhinnorhea. Mouth/Throat: Mucous membranes are moist.   Neck: No stridor.   Cardiovascular: Normal rate,  regular rhythm. Grossly normal heart sounds.  Good peripheral circulation. Respiratory: Normal respiratory effort.  No retractions. Lungs CTAB. Gastrointestinal: Soft and nontender. No distention. No abdominal bruits. No CVA tenderness. Musculoskeletal: No lower extremity tenderness nor edema.  No joint effusions. Neurologic:  Normal speech and language. No gross focal neurologic deficits are appreciated. No gait instability. Skin:  Skin is warm, dry and intact. No rash noted. Psychiatric: Mood and affect are normal. Speech and behavior are normal.  ____________________________________________   LABS (all labs ordered are listed, but only abnormal results are displayed)  Labs Reviewed  CBC - Abnormal; Notable for the following components:      Result Value   RBC 4.12 (*)    Hemoglobin 12.4 (*)    HCT 35.9 (*)    All other components within normal limits  COMPREHENSIVE METABOLIC PANEL  ETHANOL  SALICYLATE LEVEL  ACETAMINOPHEN LEVEL  URINE DRUG SCREEN, QUALITATIVE ([facility] ONLY)  POC SARS CORONAVIRUS 2 AG -  ED   ____________________________________________  EKG  None ____________________________________________  RADIOLOGY 09/04/20 J, personally viewed and evaluated these images (plain radiographs) as part of my medical decision making, as well as reviewing the written report by the radiologist.  ED MD interpretation: None  Official radiology report(s): No results found.  ____________________________________________   PROCEDURES  Procedure(s) performed (including Critical Care):  Procedures   ____________________________________________   INITIAL IMPRESSION / ASSESSMENT AND PLAN / ED COURSE  As part of my medical decision making, I reviewed the following data within the electronic MEDICAL RECORD NUMBER Nursing notes reviewed and incorporated, Labs reviewed, Old chart reviewed, A consult was requested and obtained from this/these consultant(s) Psychiatry, and Notes  from prior ED visits     65 year old male seeking behavioral medicine evaluation for depression. The patient has been placed in psychiatric observation due to the need to provide a safe environment for the patient while obtaining psychiatric consultation and evaluation, as well as ongoing medical and medication management to treat the patient's condition.  The patient has not been placed under full IVC at this time.   Clinical Course as of 09/02/20 0507  Wed Sep 02, 2020  Sep 04, 2020 Patient tested positive for COVID last month.  Will obtain rapid COVID swab and send to [facility] if negative. [JS]    Clinical Course User Index [JS] 5701, MD     ____________________________________________   FINAL CLINICAL IMPRESSION(S) / ED DIAGNOSES  Final diagnoses:  Current moderate episode of major depressive disorder, unspecified whether recurrent [facility])     ED Discharge Orders     None        Note:  This document was prepared using Dragon voice recognition software and may include unintentional dictation errors.    [facility], MD 09/02/20 [phone]

## 2020-09-02 NOTE — ED Notes (Signed)
Dr Roxan Hockey and Dr Katrinka Blazing notified of diabetic RN's suggestions for CBG control while here. Awaiting new orders.

## 2020-09-02 NOTE — ED Notes (Signed)
Heard loud thud on floor. Pt noted on camera to be on floor laid on back. Pt fell but unable to tell this RN how he fell. Helped up x1 assist and wanted to go to the bathroom. Noted to have stool in underwear. Assisted to be cleaned in bathroom. New underwear and pants given.

## 2020-09-02 NOTE — Progress Notes (Signed)
Inpatient Diabetes Program Recommendations  AACE/ADA: New Consensus Statement on Inpatient Glycemic Control   Target Ranges:  Prepandial:   less than 140 mg/dL      Peak postprandial:   less than 180 mg/dL (1-2 hours)      Critically ill patients:  140 - 180 mg/dL   Results for HAMILTON, MARINELLO (MRN [id number]) as of 09/02/2020 11:35  Ref. Range 09/02/2020 04:29  Glucose Latest Ref Range: 70 - 99 mg/dL 413 (H)   Review of Glycemic Control  Diabetes history: DM2 Outpatient Diabetes medications: Lantus 60 units BID, Victoza 1.2 mg QAM, Metformin 1000 mg BID Current orders for Inpatient glycemic control: Lantus 60 units BID, Metformin 1000 mg BID, Victoza 1.2 mg QAM  Inpatient Diabetes Program Recommendations:    Insulin: Please decrease Lantus to 50 units daily to start 09/03/20 (already given Lantus 60 units today), ordering CBGs AC&HS, Novolog 0-15 units TID with meals, and Novolog 0-5 units QHS.  Injectable: Please discontinue Victoza as it is not on hospital formulary.  Thanks, Orlando Penner, RN, MSN, CDE Diabetes Coordinator Inpatient Diabetes Program [phone] (Team Pager from 8am to 5pm)

## 2020-09-02 NOTE — ED Notes (Signed)
Pt belongings: Blue shoes White socks Blue pants Chief Technology Officer wedding band Recruitment consultant top [employer] ring Black underwear

## 2020-09-02 NOTE — Consult Note (Signed)
[facility] Face-to-Face Psychiatry Consult   Reason for Consult: Depression Referring Physician: Dr. Dolores Frame Patient Identification: Steve Alvarado MRN:  [id number] Principal Diagnosis: <principal problem not specified> Diagnosis:  Active Problems:   MDD (major depressive disorder), recurrent episode, severe (HCC)   Total Time spent with patient: 20 minutes  Subjective: "I was not feeling right.  My mind is confused and depressed." Steve Alvarado is a 65 y.o. male patient presented to [facility] ED voluntary voicing depressive symptoms and suicidal ideation without a plan. The patient's wife were seen two days ago for depression due to her becoming homeless. She shared she recently lost her apartment due to her husband (the patient), who is not on the lease, and a substance abuser (crack cocaine).  The patient was seen face-to-face by this provider; the chart was reviewed and consulted with Dr.Sung on 09/02/2020 due to his care. It was discussed with the EDP that he would be referred to an inpatient substance treatment facility if it's all possible to get him a bed. The patient currently denies being on an antidepressant. He voiced, "I am supposed to be taking medication for my depression."  The patient also shared he was in treatment for his drug use many years ago in [place]. On evaluation, the patient is emotional, alert, oriented x4, calm, initially refusing to cooperate, and mood-congruent with affect. The patient does not appear to be responding to internal or external stimuli. Neither is the patient presenting with any delusional thinking. The patient denies auditory or visual hallucinations. The patient admitted to suicidal ideations, but denies homicidal, or self-harm ideations. The patient is not presenting with any psychotic or paranoid behaviors. During an encounter with the patient, he could answer questions appropriately.  HPI:    Past Psychiatric History:   Risk to Self:   Risk to  Others:   Prior Inpatient Therapy:   Prior Outpatient Therapy:    Past Medical History:  Past Medical History:  Diagnosis Date   Coronary artery disease    Diabetes mellitus    Type II   Hyperlipidemia    Hypertension    Thyroid disease     Past Surgical History:  Procedure Laterality Date   CARDIAC CATHETERIZATION  08/06/2010   Bare metal stent placed in RCA.   HAND SURGERY     left   Family History:  Family History  Problem Relation Age of Onset   Heart attack Mother    Family Psychiatric  History:  Social History:  Social History   Substance and Sexual Activity  Alcohol Use No     Social History   Substance and Sexual Activity  Drug Use Not Currently    Social History   Socioeconomic History   Marital status: Married    Spouse name: Not on file   Number of children: Not on file   Years of education: Not on file   Highest education level: Not on file  Occupational History   Not on file  Tobacco Use   Smoking status: Never   Smokeless tobacco: Never  Substance and Sexual Activity   Alcohol use: No   Drug use: Not Currently   Sexual activity: Not on file  Other Topics Concern   Not on file  Social History Narrative   Not on file   Social Determinants of Health   Financial Resource Strain: Not on file  Food Insecurity: Not on file  Transportation Needs: Not on file  Physical Activity: Not on file  Stress:  Not on file  Social Connections: Not on file   Additional Social History:    Allergies:   Allergies  Allergen Reactions   Ace Inhibitors Cough    cough    Labs:  Results for orders placed or performed during the hospital encounter of 09/02/20 (from the past 48 hour(s))  Comprehensive metabolic panel     Status: Abnormal   Collection Time: 09/02/20  4:29 AM  Result Value Ref Range   Sodium 133 (L) 135 - 145 mmol/L   Potassium 3.6 3.5 - 5.1 mmol/L   Chloride 100 98 - 111 mmol/L   CO2 23 22 - 32 mmol/L   Glucose, Bld 262 (H) 70 - 99  mg/dL    Comment: Glucose reference range applies only to samples taken after fasting for at least 8 hours.   BUN 17 8 - 23 mg/dL   Creatinine, Ser 1.611.05 0.61 - 1.24 mg/dL   Calcium 9.2 8.9 - 09.610.3 mg/dL   Total Protein 7.5 6.5 - 8.1 g/dL   Albumin 4.0 3.5 - 5.0 g/dL   AST 25 15 - 41 U/L   ALT 25 0 - 44 U/L   Alkaline Phosphatase 70 38 - 126 U/L   Total Bilirubin 1.2 0.3 - 1.2 mg/dL   GFR, Estimated >04>60 >54>60 mL/min    Comment: (NOTE) Calculated using the CKD-EPI Creatinine Equation (2021)    Anion gap 10 5 - 15    Comment: Performed at [facility], [address] [place], [place] [address]  Ethanol     Status: None   Collection Time: 09/02/20  4:29 AM  Result Value Ref Range   Alcohol, Ethyl (B) <10 <10 mg/dL    Comment: (NOTE) Lowest detectable limit for serum alcohol is 10 mg/dL.  For medical purposes only. Performed at [facility], [address] [place], [place] [address]   Salicylate level     Status: Abnormal   Collection Time: 09/02/20  4:29 AM  Result Value Ref Range   Salicylate Lvl <7.0 (L) 7.0 - 30.0 mg/dL    Comment: Performed at [facility], [address] [place], [place] [address]  Acetaminophen level     Status: Abnormal   Collection Time: 09/02/20  4:29 AM  Result Value Ref Range   Acetaminophen (Tylenol), Serum <10 (L) 10 - 30 ug/mL    Comment: (NOTE) Therapeutic concentrations vary significantly. A range of 10-30 ug/mL  may be an effective concentration for many patients. However, some  are best treated at concentrations outside of this range. Acetaminophen concentrations >150 ug/mL at 4 hours after ingestion  and >50 ug/mL at 12 hours after ingestion are often associated with  toxic reactions.  Performed at [facility], [address] [place], [place] [address]   cbc     Status: Abnormal   Collection Time: 09/02/20  4:29 AM  Result Value Ref Range   WBC 7.2 4.0 - 10.5 K/uL   RBC 4.12 (L) 4.22 - 5.81  MIL/uL   Hemoglobin 12.4 (L) 13.0 - 17.0 g/dL   HCT 57.835.9 (L) 46.939.0 - 62.952.0 %   MCV 87.1 80.0 - 100.0 fL   MCH 30.1 26.0 - 34.0 pg   MCHC 34.5 30.0 - 36.0 g/dL   RDW 52.812.1 41.311.5 - 24.415.5 %   Platelets 257 150 - 400 K/uL   nRBC 0.0 0.0 - 0.2 %    Comment: Performed at [facility], [address] [place], [place] [address]  POC  SARS Coronavirus 2 Ag-ED - Nasal Swab     Status: None   Collection Time: 09/02/20  4:53 AM  Result Value Ref Range   SARSCOV2ONAVIRUS 2 AG NEGATIVE NEGATIVE    Comment: (NOTE) SARS-CoV-2 antigen NOT DETECTED.   Negative results are presumptive.  Negative results do not preclude SARS-CoV-2 infection and should not be used as the sole basis for treatment or other patient management decisions, including infection  control decisions, particularly in the presence of clinical signs and  symptoms consistent with COVID-19, or in those who have been in contact with the virus.  Negative results must be combined with clinical observations, patient history, and epidemiological information. The expected result is Negative.  Fact Sheet for Patients: https://www.jennings-kim.com/  Fact Sheet for Healthcare Providers: https://alexander-rogers.biz/  This test is not yet approved or cleared by the [place] FDA and  has been authorized for detection and/or diagnosis of SARS-CoV-2 by FDA under an Emergency Use Authorization (EUA).  This EUA will remain in effect (meaning this test can be used) for the duration of  the COV ID-19 declaration under Section 564(b)(1) of the Act, 21 U.S.C. section 360bbb-3(b)(1), unless the authorization is terminated or revoked sooner.      No current facility-administered medications for this encounter.   Current Outpatient Medications  Medication Sig Dispense Refill   atorvastatin (LIPITOR) 40 MG tablet Take 1 tablet (40 mg total) by mouth every evening. 30 tablet 0   citalopram (CELEXA) 20 MG tablet  Take 1 tablet (20 mg total) by mouth daily. 30 tablet 0   gabapentin (NEURONTIN) 300 MG capsule Take 900 mg by mouth 2 (two) times daily.     hydrochlorothiazide (HYDRODIURIL) 25 MG tablet Take 1 tablet (25 mg total) by mouth daily. 30 tablet 0   insulin glargine (LANTUS) 100 unit/mL SOPN Inject 20 Units into the skin 2 (two) times daily.     lidocaine (LIDODERM) 5 % Place 1 patch onto the skin every 12 (twelve) hours. Remove & Discard patch within 12 hours or as directed by MD 10 patch 0    Musculoskeletal: Strength & Muscle Tone: within normal limits Gait & Station: normal Patient leans: N/A  Psychiatric Specialty Exam:  Presentation  General Appearance: Appropriate for Environment  Eye Contact:Poor  Speech:Blocked  Speech Volume:Decreased  Handedness: No data recorded  Mood and Affect  Mood:Anxious; Depressed  Affect:Blunt; Congruent; Depressed; Flat; Tearful   Thought Process  Thought Processes:Coherent  Descriptions of Associations:Intact  Orientation:Full (Time, Place and Person)  Thought Content:Logical  History of Schizophrenia/Schizoaffective disorder:No data recorded Duration of Psychotic Symptoms:No data recorded Hallucinations:Hallucinations: Auditory Description of Auditory Hallucinations: People talking  Ideas of Reference:None  Suicidal Thoughts:Suicidal Thoughts: Yes, Passive SI Passive Intent and/or Plan: Without Intent; Without Plan  Homicidal Thoughts:Homicidal Thoughts: No   Sensorium  Memory:Immediate Good; Recent Good; Remote Good  Judgment:Fair  Insight:Fair   Executive Functions  Concentration:Good  Attention Span:Good  Recall:Good  Fund of Knowledge:Good  Language:Good   Psychomotor Activity  Psychomotor Activity:Psychomotor Activity: Normal   Assets  Assets:Communication Skills; Desire for Improvement; Housing; Physical Health; Resilience; Social Support   Sleep  Sleep:Sleep: Poor   Physical  Exam: Physical Exam Vitals and nursing note reviewed.  Constitutional:      Appearance: Normal appearance. He is normal weight.  HENT:     Head: Normocephalic.     Nose: Nose normal.     Mouth/Throat:     Mouth: Mucous membranes are moist.  Cardiovascular:     Rate  and Rhythm: Normal rate.     Pulses: Normal pulses.  Pulmonary:     Effort: Pulmonary effort is normal.  Musculoskeletal:        General: Normal range of motion.     Cervical back: Normal range of motion and neck supple.  Neurological:     Mental Status: He is alert and oriented to person, place, and time.  Psychiatric:        Attention and Perception: Attention and perception normal.        Mood and Affect: Mood is anxious and depressed. Affect is blunt and tearful.        Speech: Speech normal.        Behavior: Behavior is slowed and withdrawn. Behavior is cooperative.        Thought Content: Thought content includes suicidal ideation.        Cognition and Memory: Cognition and memory normal.        Judgment: Judgment normal.   Review of Systems  Psychiatric/Behavioral:  Positive for depression, substance abuse and suicidal ideas. The patient is nervous/anxious and has insomnia.   All other systems reviewed and are negative. Blood pressure (!) 146/91, pulse 83, temperature 98.2 F (36.8 C), temperature source Oral, resp. rate 18, height 6\' 2"  (1.88 m), weight 86.2 kg, SpO2 98 %. Body mass index is 24.39 kg/m.  Treatment Plan Summary: Plan patient meets criteria for for  substance abuse inpatient treatment.  Disposition: Supportive therapy provided about ongoing stressors. Refer to IOP. patient meets criteria for for substance abuse inpatient treatment.  Per patient request he would like to receive help for his substance use disorder.  , NP 09/02/2020 5:41 AM

## 2020-09-02 NOTE — ED Triage Notes (Signed)
Pt states that he has been depressed and yesterday he began to feel suicidal, pt denies suicide plan. Pt denies auditory and visual hallucinations. Pt denies HI

## 2020-09-02 NOTE — BH Assessment (Signed)
Detox  Referral information for detox treatment faxed to:   [facility] ([phone] or [phone])   [facility] ([phone])   [facility] ([phone])   [facility] ([phone])  . [facility] ([phone]) (Walk-in only)  . [facility] ([phone])   [facility] ([phone])  . [facility] ([phone])  . [facility] ([phone])  . [facility] ([phone]) 

## 2020-09-02 NOTE — ED Notes (Signed)
Breakfast given.  

## 2020-09-02 NOTE — BH Assessment (Signed)
Patient has been accepted to [facility] on tomm 09/03/20 after 9:00am.  Patient assigned to [facility]. Accepting physician is Dr. Consuelo Pandy.  Call report to [phone].  Representative was [employer].   ER Staff is aware of it:  Misty Stanley, ER Secretary  Dr.Robinson, ER MD  Shanda Bumps, Patient's Nurse  ** Patient is voluntary and will need Safe Transport **

## 2020-09-02 NOTE — ED Notes (Addendum)
Per phone call, Mr Steve Alvarado st that he "took [place] home where it will stay for now because it is his mother's"; pt notified and voices understanding, st "we share the Zenaida Niece" and declines wanting to proceed with any further action; conversation witnessed by care nurse Lacinda Axon, RN

## 2020-09-02 NOTE — ED Notes (Signed)
Pt. Transferred from Triage to room 23 after dressing out and screening for contraband. Report to include Situation, Background, Assessment and Recommendations from Raquel RN. Pt. Oriented to Quad including Q15 minute rounds as well as Psychologist, counselling for their protection. Patient is alert and oriented, warm and dry in no acute distress. Patient reported SI without a plan. Denied HI, and AVH. Pt. Encouraged to let me know if needs arise.

## 2020-09-02 NOTE — ED Notes (Addendum)
Pt gives car keys to this nurse to allow Juanita Laster (identified by personal driver's license [phone] [address], [place]) to move his vehicle; conversation witnessed by care nurse Lacinda Axon, RN; identification witnessed by [facility] Renata Caprice; Mr Elizebeth Brooking st will return keys after moving vehicle

## 2020-09-02 NOTE — ED Notes (Signed)
Patient gave first nurse Misty Stanley and I a permission to get his car keys for his belongings and give to his step son. Patient is aware that his step son didn't return the key back. Please read Lisa"s note for more detailed information.

## 2020-09-02 NOTE — ED Notes (Signed)
Per TTS, [facility] needs a pcr test for admission.

## 2020-09-02 NOTE — Consult Note (Signed)
[facility] Face-to-Face Psychiatry Consult   Reason for Consult: Consult for 65 year old man came to the emergency room last night complaining of depression and mentioning suicidal ideation in the context of substance abuse Referring Physician: Katrinka Blazing Patient Identification: Steve Alvarado MRN:  [id number] Principal Diagnosis: MDD (major depressive disorder), recurrent episode, severe (HCC) Diagnosis:  Principal Problem:   MDD (major depressive disorder), recurrent episode, severe (HCC)   Total Time spent with patient: 30 minutes  Subjective:   Steve Alvarado is a 65 y.o. male patient admitted with patient refusing to talk.  HPI: 65 year old man came to the emergency room last night dating symptoms of depression and cocaine abuse.  Feeling hopeless and helpless.  No place to live.  Recently evicted from the apartment where he was staying.  On interview this afternoon the patient refuses to talk with me.  He had a fall or at least was found down on the ground earlier today.  Head CT negative.  Past Psychiatric History: History of substance abuse  Risk to Self:   Risk to Others:   Prior Inpatient Therapy:   Prior Outpatient Therapy:    Past Medical History:  Past Medical History:  Diagnosis Date   Coronary artery disease    Diabetes mellitus    Type II   Hyperlipidemia    Hypertension    Thyroid disease     Past Surgical History:  Procedure Laterality Date   CARDIAC CATHETERIZATION  08/06/2010   Bare metal stent placed in RCA.   HAND SURGERY     left   Family History:  Family History  Problem Relation Age of Onset   Heart attack Mother    Family Psychiatric  History: None reported Social History:  Social History   Substance and Sexual Activity  Alcohol Use No     Social History   Substance and Sexual Activity  Drug Use Not Currently    Social History   Socioeconomic History   Marital status: Married    Spouse name: Not on file   Number of children: Not on  file   Years of education: Not on file   Highest education level: Not on file  Occupational History   Not on file  Tobacco Use   Smoking status: Never   Smokeless tobacco: Never  Substance and Sexual Activity   Alcohol use: No   Drug use: Not Currently   Sexual activity: Not on file  Other Topics Concern   Not on file  Social History Narrative   Not on file   Social Determinants of Health   Financial Resource Strain: Not on file  Food Insecurity: Not on file  Transportation Needs: Not on file  Physical Activity: Not on file  Stress: Not on file  Social Connections: Not on file   Additional Social History:    Allergies:   Allergies  Allergen Reactions   Ace Inhibitors Cough    cough    Labs:  Results for orders placed or performed during the hospital encounter of 09/02/20 (from the past 48 hour(s))  Comprehensive metabolic panel     Status: Abnormal   Collection Time: 09/02/20  4:29 AM  Result Value Ref Range   Sodium 133 (L) 135 - 145 mmol/L   Potassium 3.6 3.5 - 5.1 mmol/L   Chloride 100 98 - 111 mmol/L   CO2 23 22 - 32 mmol/L   Glucose, Bld 262 (H) 70 - 99 mg/dL    Comment: Glucose reference range applies only to  samples taken after fasting for at least 8 hours.   BUN 17 8 - 23 mg/dL   Creatinine, Ser 1.611.05 0.61 - 1.24 mg/dL   Calcium 9.2 8.9 - 09.610.3 mg/dL   Total Protein 7.5 6.5 - 8.1 g/dL   Albumin 4.0 3.5 - 5.0 g/dL   AST 25 15 - 41 U/L   ALT 25 0 - 44 U/L   Alkaline Phosphatase 70 38 - 126 U/L   Total Bilirubin 1.2 0.3 - 1.2 mg/dL   GFR, Estimated >04>60 >54>60 mL/min    Comment: (NOTE) Calculated using the CKD-EPI Creatinine Equation (2021)    Anion gap 10 5 - 15    Comment: Performed at [facility], [address] [place], [place] [address]  Ethanol     Status: None   Collection Time: 09/02/20  4:29 AM  Result Value Ref Range   Alcohol, Ethyl (B) <10 <10 mg/dL    Comment: (NOTE) Lowest detectable limit for serum alcohol is 10  mg/dL.  For medical purposes only. Performed at [facility], [address] [place], [place] [address]   Salicylate level     Status: Abnormal   Collection Time: 09/02/20  4:29 AM  Result Value Ref Range   Salicylate Lvl <7.0 (L) 7.0 - 30.0 mg/dL    Comment: Performed at [facility], [address] [place], [place] [address]  Acetaminophen level     Status: Abnormal   Collection Time: 09/02/20  4:29 AM  Result Value Ref Range   Acetaminophen (Tylenol), Serum <10 (L) 10 - 30 ug/mL    Comment: (NOTE) Therapeutic concentrations vary significantly. A range of 10-30 ug/mL  may be an effective concentration for many patients. However, some  are best treated at concentrations outside of this range. Acetaminophen concentrations >150 ug/mL at 4 hours after ingestion  and >50 ug/mL at 12 hours after ingestion are often associated with  toxic reactions.  Performed at [facility], [address] [place], [place] [address]   cbc     Status: Abnormal   Collection Time: 09/02/20  4:29 AM  Result Value Ref Range   WBC 7.2 4.0 - 10.5 K/uL   RBC 4.12 (L) 4.22 - 5.81 MIL/uL   Hemoglobin 12.4 (L) 13.0 - 17.0 g/dL   HCT 57.835.9 (L) 46.939.0 - 62.952.0 %   MCV 87.1 80.0 - 100.0 fL   MCH 30.1 26.0 - 34.0 pg   MCHC 34.5 30.0 - 36.0 g/dL   RDW 52.812.1 41.311.5 - 24.415.5 %   Platelets 257 150 - 400 K/uL   nRBC 0.0 0.0 - 0.2 %    Comment: Performed at [facility], [address] [place], [place] [address]  POC SARS Coronavirus 2 Ag-ED - Nasal Swab     Status: None   Collection Time: 09/02/20  4:53 AM  Result Value Ref Range   SARSCOV2ONAVIRUS 2 AG NEGATIVE NEGATIVE    Comment: (NOTE) SARS-CoV-2 antigen NOT DETECTED.   Negative results are presumptive.  Negative results do not preclude SARS-CoV-2 infection and should not be used as the sole basis for treatment or other patient management decisions, including infection  control decisions, particularly in the presence  of clinical signs and  symptoms consistent with COVID-19, or in those who have been in contact with the virus.  Negative results must be combined with clinical observations, patient history, and epidemiological information. The expected result is Negative.  Fact Sheet for Patients: https://www.jennings-kim.com/https://www.fda.gov/media/141569/download  Fact Sheet for Healthcare  Providers: https://alexander-rogers.biz/  This test is not yet approved or cleared by the [place] and  has been authorized for detection and/or diagnosis of SARS-CoV-2 by FDA under an Emergency Use Authorization (EUA).  This EUA will remain in effect (meaning this test can be used) for the duration of  the COV ID-19 declaration under Section 564(b)(1) of the Act, 21 U.S.C. section 360bbb-3(b)(1), unless the authorization is terminated or revoked sooner.    Resp Panel by RT-PCR (Flu A&B, Covid) Nasopharyngeal Swab     Status: None   Collection Time: 09/02/20 12:24 PM   Specimen: Nasopharyngeal Swab; Nasopharyngeal(NP) swabs in vial transport medium  Result Value Ref Range   SARS Coronavirus 2 by RT PCR NEGATIVE NEGATIVE    Comment: (NOTE) SARS-CoV-2 target nucleic acids are NOT DETECTED.  The SARS-CoV-2 RNA is generally detectable in upper respiratory specimens during the acute phase of infection. The lowest concentration of SARS-CoV-2 viral copies this assay can detect is 138 copies/mL. A negative result does not preclude SARS-Cov-2 infection and should not be used as the sole basis for treatment or other patient management decisions. A negative result may occur with  improper specimen collection/handling, submission of specimen other than nasopharyngeal swab, presence of viral mutation(s) within the areas targeted by this assay, and inadequate number of viral copies(<138 copies/mL). A negative result must be combined with clinical observations, patient history, and epidemiological information. The expected  result is Negative.  Fact Sheet for Patients:  BloggerCourse.com  Fact Sheet for Healthcare Providers:  SeriousBroker.it  This test is no t yet approved or cleared by the [place] FDA and  has been authorized for detection and/or diagnosis of SARS-CoV-2 by FDA under an Emergency Use Authorization (EUA). This EUA will remain  in effect (meaning this test can be used) for the duration of the COVID-19 declaration under Section 564(b)(1) of the Act, 21 U.S.C.section 360bbb-3(b)(1), unless the authorization is terminated  or revoked sooner.       Influenza A by PCR NEGATIVE NEGATIVE   Influenza B by PCR NEGATIVE NEGATIVE    Comment: (NOTE) The Xpert Xpress SARS-CoV-2/FLU/RSV plus assay is intended as an aid in the diagnosis of influenza from Nasopharyngeal swab specimens and should not be used as a sole basis for treatment. Nasal washings and aspirates are unacceptable for Xpert Xpress SARS-CoV-2/FLU/RSV testing.  Fact Sheet for Patients: BloggerCourse.com  Fact Sheet for Healthcare Providers: SeriousBroker.it  This test is not yet approved or cleared by the [place] FDA and has been authorized for detection and/or diagnosis of SARS-CoV-2 by FDA under an Emergency Use Authorization (EUA). This EUA will remain in effect (meaning this test can be used) for the duration of the COVID-19 declaration under Section 564(b)(1) of the Act, 21 U.S.C. section 360bbb-3(b)(1), unless the authorization is terminated or revoked.  Performed at [facility], [address] [place], [place] [address]   CBG monitoring, ED     Status: Abnormal   Collection Time: 09/02/20  3:42 PM  Result Value Ref Range   Glucose-Capillary 241 (H) 70 - 99 mg/dL    Comment: Glucose reference range applies only to samples taken after fasting for at least 8 hours.    Current  Facility-Administered Medications  Medication Dose Route Frequency Provider Last Rate Last Admin   amLODipine (NORVASC) tablet 10 mg  10 mg Oral Daily Willy Eddy, MD   10 mg at 09/02/20 1035   atorvastatin (LIPITOR) tablet 40 mg  40 mg Oral QPM Roxan Hockey,  Luisa Hart, MD       citalopram (CELEXA) tablet 20 mg  20 mg Oral Daily Willy Eddy, MD   20 mg at 09/02/20 1035   gabapentin (NEURONTIN) capsule 900 mg  900 mg Oral BID Willy Eddy, MD   900 mg at 09/02/20 1035   hydrochlorothiazide (HYDRODIURIL) tablet 25 mg  25 mg Oral Daily Willy Eddy, MD   25 mg at 09/02/20 1035   insulin glargine (LANTUS) injection 60 Units  60 Units Subcutaneous BID Willy Eddy, MD   60 Units at 09/02/20 1120   [START ON 09/03/2020] levothyroxine (SYNTHROID) tablet 50 mcg  50 mcg Oral QAC breakfast Willy Eddy, MD       [START ON 09/03/2020] liraglutide (VICTOZA) SOPN 1.2 mg  1.2 mg Subcutaneous q AM Sharen Hones, RPH       losartan (COZAAR) tablet 100 mg  100 mg Oral Daily Willy Eddy, MD   100 mg at 09/02/20 1035   metFORMIN (GLUCOPHAGE) tablet 1,000 mg  1,000 mg Oral BID Willy Eddy, MD   1,000 mg at 09/02/20 1035   mirtazapine (REMERON) tablet 7.5 mg  7.5 mg Oral Daily Willy Eddy, MD   7.5 mg at 09/02/20 1035   oxyCODONE-acetaminophen (PERCOCET) 7.5-325 MG per tablet 1 tablet  1 tablet Oral BID PRN Willy Eddy, MD       Current Outpatient Medications  Medication Sig Dispense Refill   amLODipine (NORVASC) 10 MG tablet Take 10 mg by mouth daily.     citalopram (CELEXA) 20 MG tablet Take 1 tablet (20 mg total) by mouth daily. 30 tablet 0   gabapentin (NEURONTIN) 300 MG capsule Take 900 mg by mouth 2 (two) times daily.     hydrochlorothiazide (HYDRODIURIL) 25 MG tablet Take 1 tablet (25 mg total) by mouth daily. 30 tablet 0   insulin glargine (LANTUS) 100 unit/mL SOPN Inject 60 Units into the skin 2 (two) times daily.     levothyroxine (SYNTHROID) 50 MCG  tablet Take 50 mcg by mouth daily.     liraglutide (VICTOZA) 18 MG/3ML SOPN Inject 1.2 mg into the skin in the morning.     losartan (COZAAR) 100 MG tablet Take 100 mg by mouth daily.     metFORMIN (GLUCOPHAGE) 1000 MG tablet Take 1,000 mg by mouth 2 (two) times daily.     mirtazapine (REMERON) 15 MG tablet Take 7.5 mg by mouth daily.     oxyCODONE-acetaminophen (PERCOCET) 7.5-325 MG tablet Take 1 tablet by mouth 2 (two) times daily as needed.     atorvastatin (LIPITOR) 40 MG tablet Take 1 tablet (40 mg total) by mouth every evening. 30 tablet 0    Musculoskeletal: Strength & Muscle Tone: within normal limits Gait & Station: normal Patient leans: N/A            Psychiatric Specialty Exam:  Presentation  General Appearance: Appropriate for Environment  Eye Contact:Poor  Speech:Blocked  Speech Volume:Decreased  Handedness: No data recorded  Mood and Affect  Mood:Anxious; Depressed  Affect:Blunt; Congruent; Depressed; Flat; Tearful   Thought Process  Thought Processes:Coherent  Descriptions of Associations:Intact  Orientation:Full (Time, Place and Person)  Thought Content:Logical  History of Schizophrenia/Schizoaffective disorder:No  Duration of Psychotic Symptoms:Less than six months  Hallucinations:Hallucinations: Auditory Description of Auditory Hallucinations: People talking  Ideas of Reference:None  Suicidal Thoughts:Suicidal Thoughts: Yes, Passive SI Passive Intent and/or Plan: Without Intent; Without Plan  Homicidal Thoughts:Homicidal Thoughts: No   Sensorium  Memory:Immediate Good; Recent Good; Remote Good  Judgment:Fair  Insight:Fair  Executive Functions  Concentration:Good  Attention Span:Good  Recall:Good  Fund of Knowledge:Good  Language:Good   Psychomotor Activity  Psychomotor Activity:Psychomotor Activity: Normal   Assets  Assets:Communication Skills; Desire for Improvement; Housing; Physical Health; Resilience;  Social Support   Sleep  Sleep:Sleep: Poor   Physical Exam: Physical Exam Vitals and nursing note reviewed.  Constitutional:      Appearance: Normal appearance.  HENT:     Head: Normocephalic and atraumatic.     Mouth/Throat:     Pharynx: Oropharynx is clear.  Eyes:     Pupils: Pupils are equal, round, and reactive to light.  Cardiovascular:     Rate and Rhythm: Normal rate and regular rhythm.  Pulmonary:     Effort: Pulmonary effort is normal.     Breath sounds: Normal breath sounds.  Abdominal:     General: Abdomen is flat.     Palpations: Abdomen is soft.  Musculoskeletal:        General: Normal range of motion.  Skin:    General: Skin is warm and dry.  Neurological:     General: No focal deficit present.     Mental Status: He is alert. Mental status is at baseline.  Psychiatric:        Attention and Perception: He is inattentive.        Speech: He is noncommunicative.   Review of Systems  Unable to perform ROS: Psychiatric disorder  Blood pressure (!) 142/77, pulse 70, temperature 98.5 F (36.9 C), temperature source Oral, resp. rate 18, height 6\' 2"  (1.88 m), weight 86.2 kg, SpO2 96 %. Body mass index is 24.39 kg/m.  Treatment Plan Summary: Plan 65 year old man homeless difficult social situation substance abuse.  Claiming suicidal ideation.  Has been accepted to [facility] for tomorrow.  Will be transported at that time.  Disposition: Recommend psychiatric Inpatient admission when medically cleared.  [facility], MD 09/02/2020 5:16 PM

## 2020-09-02 NOTE — ED Notes (Signed)
MD Roxan Hockey made aware of fall. CT placed and MD will come assess.

## 2020-09-02 NOTE — BH Assessment (Addendum)
Comprehensive Clinical Assessment (CCA) Note  09/02/2020 Steve Alvarado [id number] Recommendations for Services/Supports/Treatments: Psych NP Annice PihJackie T. determined pt. recommended for substance abuse treatment. Facilities will be contacted for placement.    Pt resting upon this writer's arrival. Pt presented with coherent speech. Pt had an unremarkable appearance. Motor behavior was unremarkable. Pt avoided eye contact. Pt's mood is depressed and his affect was tearful. Pt was cooperative with the assessment. Pt identified his stressor as recently getting evicted from his apartment, leading to him having to reside with his stepson. Pt also reported that his recent housing crisis contributed to him relapsing. Pt became tearful when discussing stressors. Pt reported that he is not connected services; however, he has a depression dx. Pt had good insight and poor judgment. Pt endorsed poor sleep and appetite disturbance. The patient denied current SI, HI or AV/H.  Flowsheet Row ED from 09[facility]02/2020 [facility] to Hosp-Admission (Discharged) from 08/06/2020 [facility]  C-SSRS RISK CATEGORY Low Risk No Risk No Risk     The patient demonstrates the following risk factors for suicide: Chronic risk factors for suicide include: substance use disorder. Acute risk factors for suicide include: family or marital conflict. Protective factors for this patient include: hope for the future. Considering these factors, the overall suicide risk at this point appears to be low risk. Patient is not appropriate for outpatient follow up.  Chief Complaint:  Chief Complaint  Patient presents with   Suicidal   Visit Diagnosis: Major Depressive Disorder, recurrent, severe    CCA Screening, Triage and Referral (STR)  Patient Reported Information How did you hear  about us? Self  Referral name: No data recorded Referral phone number: No data recorded  Whom do you see for routine medical problems? No data recorded Practice/Facility Name: No data recorded Practice/Facility Phone Number: No data recorded Name of Contact: No data recorded Contact Number: No data recorded Contact Fax Number: No data recorded Prescriber Name: No data recorded Prescriber Address (if known): No data recorded  What Is the Reason for Your Visit/Call Today? Depression  How Long Has This Been Causing You Problems? <Week  What Do You Feel Would Help You the Most Today? Alcohol or Drug Use Treatment   Have You Recently Been in Any Inpatient Treatment (Hospital/Detox/Crisis Center/28-Day Program)? No data recorded Name/Location of Program/Hospital:No data recorded How Long Were You There? No data recorded When Were You Discharged? No data recorded  [facility] You [facility]? No data recorded [facility]? No data recorded  Have You Recently Had Any Thoughts About Hurting Yourself? No  Are You Planning to Commit Suicide/Harm Yourself At This time? No   Have you Recently Had Thoughts About Hurting Someone Karolee Ohslse? No  Explanation: No data recorded  Have You Used Any Alcohol or Drugs in the Past 24 Hours? Yes  How Long Ago Did You Use Drugs or Alcohol? No data recorded What Did You Use and How Much? Crack cocaine; unknown amount.   Do You Currently Have a Therapist/Psychiatrist? No  Name of Therapist/Psychiatrist: No data recorded  Have You Been Recently Discharged From Any Office Practice or Programs? No  Explanation of Discharge From Practice/Program: No data recorded    CCA Screening Triage Referral Assessment Type of Contact: Face-to-Face  Is this Initial or Reassessment? No data recorded Date Telepsych consult  ordered in [facility]:  No data recorded Time Telepsych consult ordered in [facility]:  No data  recorded  Patient Reported Information Reviewed? No data recorded Patient Left Without Being Seen? No data recorded Reason for Not Completing Assessment: No data recorded  Collateral Involvement: No data recorded  Does Patient Have a Court Appointed Legal Guardian? No data recorded Name and Contact of Legal Guardian: No data recorded If Minor and Not Living with Parent(s), Who has Custody? No data recorded Is CPS involved or ever been involved? Never  Is APS involved or ever been involved? Never   Patient Determined To Be At Risk for Harm To Self or Others Based on Review of Patient Reported Information or Presenting Complaint? No  Method: No data recorded Availability of Means: No data recorded Intent: No data recorded Notification Required: No data recorded Additional Information for Danger to Others Potential: No data recorded Additional Comments for Danger to Others Potential: No data recorded Are There Guns or Other Weapons in Your Home? No data recorded Types of Guns/Weapons: No data recorded Are These Weapons Safely Secured?                            No data recorded Who Could Verify You Are Able To Have These Secured: No data recorded Do You Have any Outstanding Charges, Pending Court Dates, Parole/Probation? No data recorded Contacted To Inform of Risk of Harm To Self or Others: No data recorded  Location of Assessment: [facility] ED   Does Patient Present under Involuntary Commitment? No  IVC Papers Initial File Date: No data recorded  [place] of Residence: [place]   Patient Currently Receiving the Following Services: Not Receiving Services   Determination of Need: Routine (7 days)   Options For Referral: -- (Per Madaline Brilliant., NP  pt is recommended for substance abuse treatment.)     CCA Biopsychosocial Intake/Chief Complaint:  No data recorded Current Symptoms/Problems: No data recorded  Patient Reported Schizophrenia/Schizoaffective Diagnosis in Past:  No   Strengths: Pt able to take care of activities of daily living.  Preferences: No data recorded Abilities: No data recorded  Type of Services Patient Feels are Needed: No data recorded  Initial Clinical Notes/Concerns: No data recorded  Mental Health Symptoms Depression:   Tearfulness; Worthlessness; Hopelessness   Duration of Depressive symptoms:  Less than two weeks   Mania:   None   Anxiety:    Worrying; Tension   Psychosis:   Hallucinations   Duration of Psychotic symptoms:  Less than six months   Trauma:   None   Obsessions:   None   Compulsions:   "Driven" to perform behaviors/acts; Intended to reduce stress or prevent another outcome   Inattention:   None   Hyperactivity/Impulsivity:   None   Oppositional/Defiant Behaviors:   None   Emotional Irregularity:  No data recorded  Other Mood/Personality Symptoms:  No data recorded   Mental Status Exam Appearance and self-care  Stature:   Average   Weight:   Average weight   Clothing:   Casual   Grooming:   Normal   Cosmetic use:   None   Posture/gait:   Normal   Motor activity:   Not Remarkable   Sensorium  Attention:   Normal   Concentration:   Anxiety interferes   Orientation:   X5   Recall/memory:   Normal   Affect and Mood  Affect:   Tearful   Mood:  Depressed; Hopeless   Relating  Eye contact:   None   Facial expression:   Depressed   Attitude toward examiner:   Cooperative   Thought and Language  Speech flow:  Clear and Coherent   Thought content:   Appropriate to Mood and Circumstances   Preoccupation:   Guilt   Hallucinations:   Auditory   Organization:  No data recorded  [employer] of Knowledge:   Average   Intelligence:   Average   Abstraction:   Normal   Judgement:   Poor   Reality Testing:   Adequate   Insight:   Present   Decision Making:   Impulsive   Social Functioning  Social Maturity:    Irresponsible   Social Judgement:   Heedless   Stress  Stressors:   Housing   Coping Ability:   Overwhelmed   Skill Deficits:   Decision making; Responsibility   Supports:   Family; Support needed     Religion:    Leisure/Recreation:    Exercise/Diet: Exercise/Diet Do You Exercise?: No Have You Gained or Lost A Significant Amount of Weight in the Past Six Months?: No Do You Follow a Special Diet?: No Do You Have Any Trouble Sleeping?: Yes   CCA Employment/Education Employment/Work Situation: Employment / Work Situation Employment Situation: Unemployed Patient's Job has Been Impacted by Current Illness: No Has Patient ever Been in Equities trader?: No  Education: Education Is Patient Currently Attending School?: No   CCA Family/Childhood History Family and Relationship History: Family history Marital status: Married What types of issues is patient dealing with in the relationship?: Pt and his wife are currently in a housing crisis. Does patient have children?: Yes How many children?: 11 How is patient's relationship with their children?: Strained  Childhood History:  Childhood History By whom was/is the patient raised?:  (UTA) Did patient suffer any verbal/emotional/physical/sexual abuse as a child?:  (UTA) Did patient suffer from severe childhood neglect?:  (UTA) Has patient ever been sexually abused/assaulted/raped as an adolescent or adult?:  (UTA) Was the patient ever a victim of a crime or a disaster?:  (UTA) Witnessed domestic violence?:  (UTA) Has patient been affected by domestic violence as an adult?:  Industrial/product designer)  Child/Adolescent Assessment:     CCA Substance Use Alcohol/Drug Use: Alcohol / Drug Use Pain Medications: See MAR Prescriptions: See MAR Over the Counter: See MAR History of alcohol / drug use?: Yes Longest period of sobriety (when/how long): Unknown Negative Consequences of Use: Personal relationships, Financial Withdrawal  Symptoms: None Substance #1 Name of Substance 1: Crack Cocaine 1 - Last Use / Amount: 09/02/20 Unknown amount Substance #2 Name of Substance 2: Alcohol 2 - Frequency: Per patient "every now and then"                     ASAM's:  Six Dimensions of Multidimensional Assessment  Dimension 1:  Acute Intoxication and/or Withdrawal Potential:   Dimension 1:  Description of individual's past and current experiences of substance use and withdrawal: None reported  Dimension 2:  Biomedical Conditions and Complications:   Dimension 2:  Description of patient's biomedical conditions and  complications: CAD  Dimension 3:  Emotional, Behavioral, or Cognitive Conditions and Complications:  Dimension 3:  Description of emotional, behavioral, or cognitive conditions and complications: Major Depression  Dimension 4:  Readiness to Change:  Dimension 4:  Description of Readiness to Change criteria: Pt motivated for change  Dimension 5:  Relapse, Continued use,  or Continued Problem Potential:  Dimension 5:  Relapse, continued use, or continued problem potential critiera description: Pt has psychosocial stressors  Dimension 6:  Recovery/Living Environment:  Dimension 6:  Recovery/Iiving environment criteria description: Pt has unstable housing  ASAM Severity Score: ASAM's Severity Rating Score: 20  ASAM Recommended Level of Treatment: ASAM Recommended Level of Treatment: Level III Residential Treatment   Substance use Disorder (SUD) Substance Use Disorder (SUD)  Checklist Symptoms of Substance Use: Continued use despite having a persistent/recurrent physical/psychological problem caused/exacerbated by use, Recurrent use that results in a failure to fulfill major role obligations (work, school, home), Repeated use in physically hazardous situations, Continued use despite persistent or recurrent social, interpersonal problems, caused or exacerbated by use  Recommendations for  Services/Supports/Treatments: Recommendations for Services/Supports/Treatments Recommendations For Services/Supports/Treatments: Inpatient Hospitalization  DSM5 Diagnoses: Patient Active Problem List   Diagnosis Date Noted   MDD (major depressive disorder), recurrent episode, severe (HCC) 09/02/2020   Syncope 08/06/2020   Intractable abdominal pain 03/08/2015   Hypertensive urgency 03/08/2015   Intractable nausea and vomiting 03/07/2015   HTN (hypertension) 02/14/2011   CAD (coronary artery disease) 08/13/2010   Stented coronary artery 08/13/2010   Diabetes mellitus (HCC) 08/13/2010   Hyperlipemia 08/13/2010      Rakwon Letourneau R Jameis Newsham, LCAS

## 2020-09-03 ENCOUNTER — Emergency Department: Payer: Medicare Other

## 2020-09-03 ENCOUNTER — Emergency Department
Admission: EM | Admit: 2020-09-03 | Discharge: 2020-09-04 | Disposition: A | Discharge status: Home / Self Care | Payer: Medicare Other | Source: Home / Self Care | Attending: Emergency Medicine | Admitting: Emergency Medicine

## 2020-09-03 ENCOUNTER — Other Ambulatory Visit: Payer: Self-pay

## 2020-09-03 DIAGNOSIS — E119 Type 2 diabetes mellitus without complications: Secondary | ICD-10-CM | POA: Insufficient documentation

## 2020-09-03 DIAGNOSIS — I10 Essential (primary) hypertension: Secondary | ICD-10-CM | POA: Insufficient documentation

## 2020-09-03 DIAGNOSIS — R531 Weakness: Secondary | ICD-10-CM

## 2020-09-03 DIAGNOSIS — Z794 Long term (current) use of insulin: Secondary | ICD-10-CM

## 2020-09-03 DIAGNOSIS — Z79899 Other long term (current) drug therapy: Secondary | ICD-10-CM | POA: Insufficient documentation

## 2020-09-03 DIAGNOSIS — Z951 Presence of aortocoronary bypass graft: Secondary | ICD-10-CM | POA: Insufficient documentation

## 2020-09-03 DIAGNOSIS — Z7984 Long term (current) use of oral hypoglycemic drugs: Secondary | ICD-10-CM | POA: Insufficient documentation

## 2020-09-03 DIAGNOSIS — I2510 Atherosclerotic heart disease of native coronary artery without angina pectoris: Secondary | ICD-10-CM | POA: Insufficient documentation

## 2020-09-03 LAB — MAGNESIUM: Magnesium: 2.2 mg/dL (ref 1.7–2.4)

## 2020-09-03 LAB — RESP PANEL BY RT-PCR (FLU A&B, COVID) ARPGX2
Influenza A by PCR: NEGATIVE
Influenza B by PCR: NEGATIVE
SARS Coronavirus 2 by RT PCR: NEGATIVE

## 2020-09-03 LAB — CBC WITH DIFFERENTIAL/PLATELET
Abs Immature Granulocytes: 0.03 10*3/uL (ref 0.00–0.07)
Basophils Absolute: 0 10*3/uL (ref 0.0–0.1)
Basophils Relative: 0 %
Eosinophils Absolute: 0.5 10*3/uL (ref 0.0–0.5)
Eosinophils Relative: 5 %
HCT: 40.4 % (ref 39.0–52.0)
Hemoglobin: 14.1 g/dL (ref 13.0–17.0)
Immature Granulocytes: 0 %
Lymphocytes Relative: 39 %
Lymphs Abs: 3.6 10*3/uL (ref 0.7–4.0)
MCH: 30.5 pg (ref 26.0–34.0)
MCHC: 34.9 g/dL (ref 30.0–36.0)
MCV: 87.4 fL (ref 80.0–100.0)
Monocytes Absolute: 0.8 10*3/uL (ref 0.1–1.0)
Monocytes Relative: 9 %
Neutro Abs: 4.2 10*3/uL (ref 1.7–7.7)
Neutrophils Relative %: 47 %
Platelets: 304 10*3/uL (ref 150–400)
RBC: 4.62 MIL/uL (ref 4.22–5.81)
RDW: 12.6 % (ref 11.5–15.5)
WBC: 9.1 10*3/uL (ref 4.0–10.5)
nRBC: 0 % (ref 0.0–0.2)

## 2020-09-03 LAB — BASIC METABOLIC PANEL
Anion gap: 10 (ref 5–15)
BUN: 33 mg/dL — ABNORMAL HIGH (ref 8–23)
CO2: 23 mmol/L (ref 22–32)
Calcium: 9.3 mg/dL (ref 8.9–10.3)
Chloride: 104 mmol/L (ref 98–111)
Creatinine, Ser: 1.89 mg/dL — ABNORMAL HIGH (ref 0.61–1.24)
GFR, Estimated: 39 mL/min — ABNORMAL LOW (ref >60)
Glucose, Bld: 150 mg/dL — ABNORMAL HIGH (ref 70–99)
Potassium: 4.2 mmol/L (ref 3.5–5.1)
Sodium: 137 mmol/L (ref 135–145)

## 2020-09-03 LAB — CBG MONITORING, ED
Glucose-Capillary: 127 mg/dL — ABNORMAL HIGH (ref 70–99)
Glucose-Capillary: 124 mg/dL — ABNORMAL HIGH (ref 70–99)

## 2020-09-03 LAB — TROPONIN I (HIGH SENSITIVITY): Troponin I (High Sensitivity): 33 ng/L — ABNORMAL HIGH (ref <18)

## 2020-09-03 IMAGING — CT CT HEAD W/O CM
3 series · 15 of 47 positions shown, 18 images · non-contrast
Comparison: CT [DATE]

CLINICAL DATA: Suicidal ideation, recent fall

EXAM:
CT HEAD WITHOUT CONTRAST
TECHNIQUE: Contiguous axial images were obtained from the base of the skull
through the vertex without intravenous contrast.

[Series 2: head wo · axial · 0.48mm/px · z∈[-146,-1]mm · 9 of 35 slices shown, 12 images]
[im 3/35  brain]
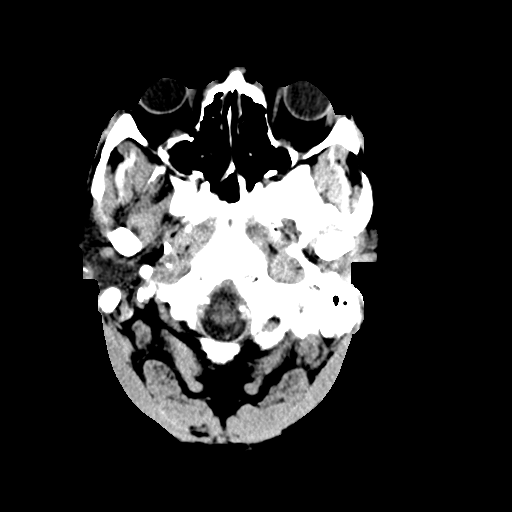
[im 3/35  bone]
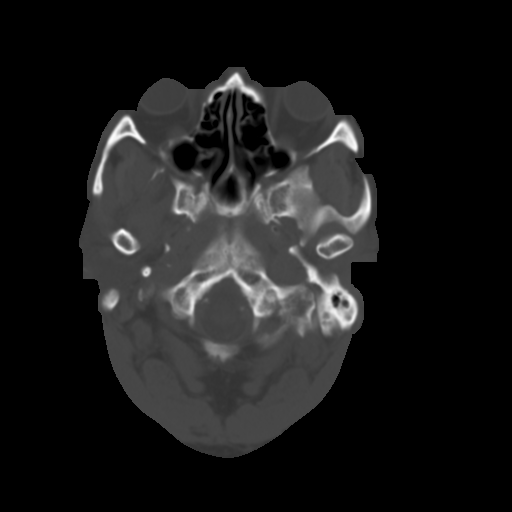
[im 6/35  brain]
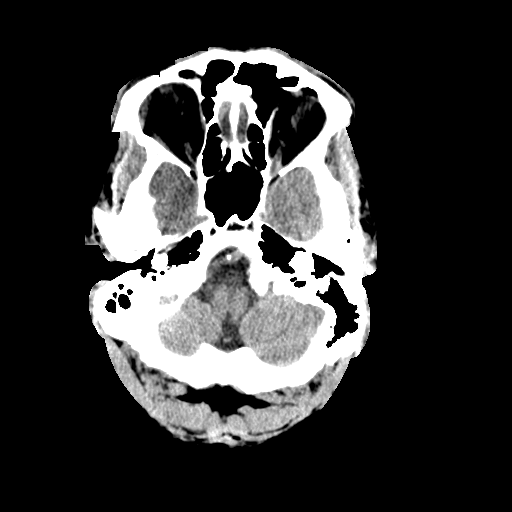
[im 10/35  brain]
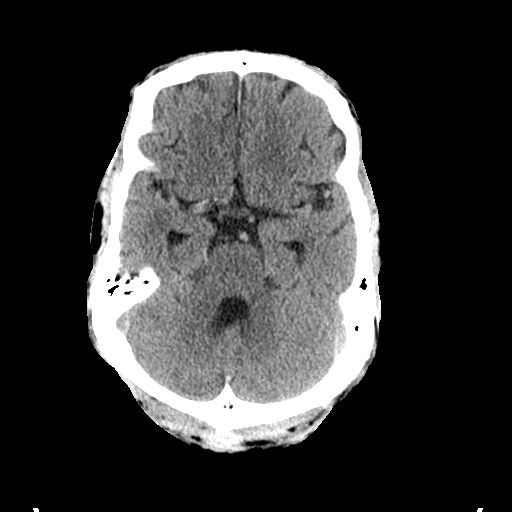
[im 13/35  brain]
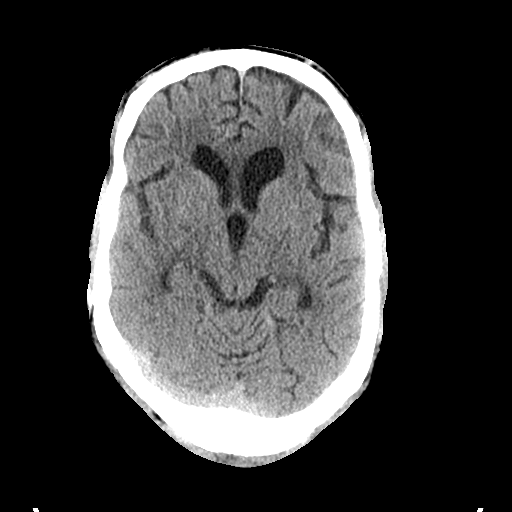
[im 18/35  brain]
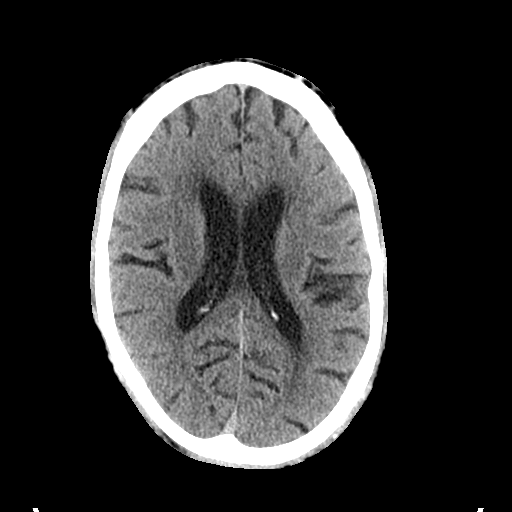
[im 18/35  bone]
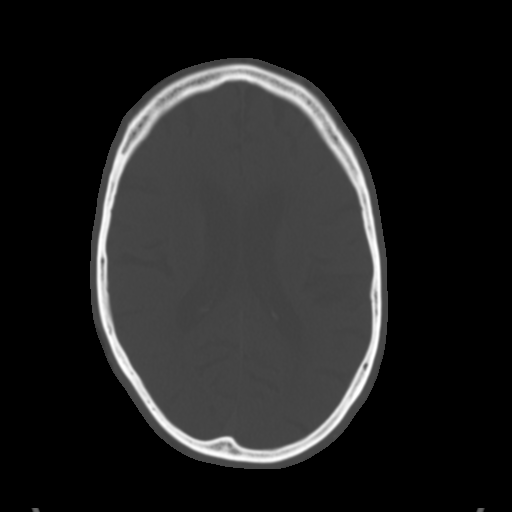
[im 22/35  brain]
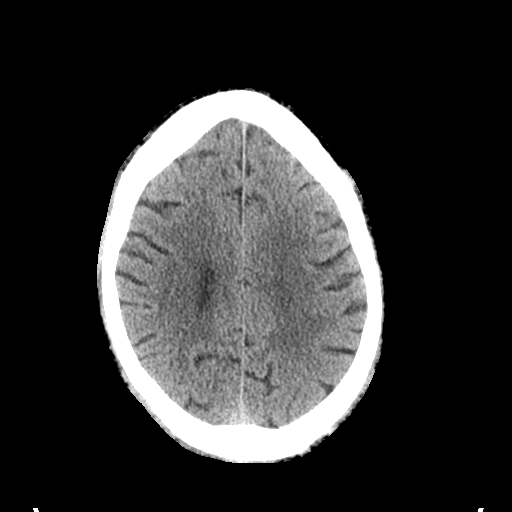
[im 25/35  brain]
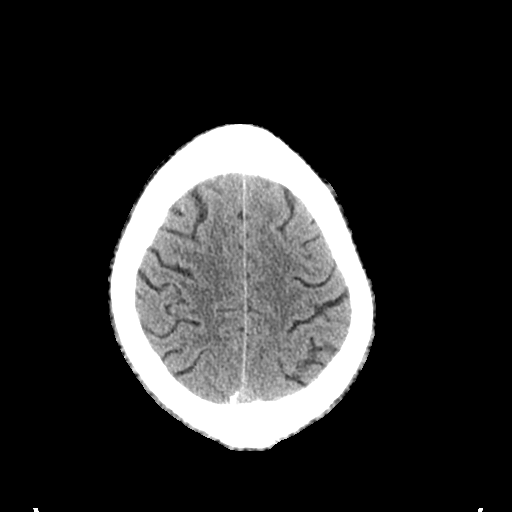
[im 29/35  brain]
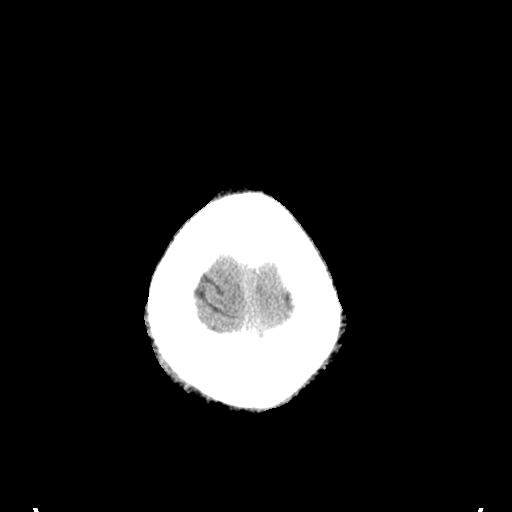
[im 32/35  brain]
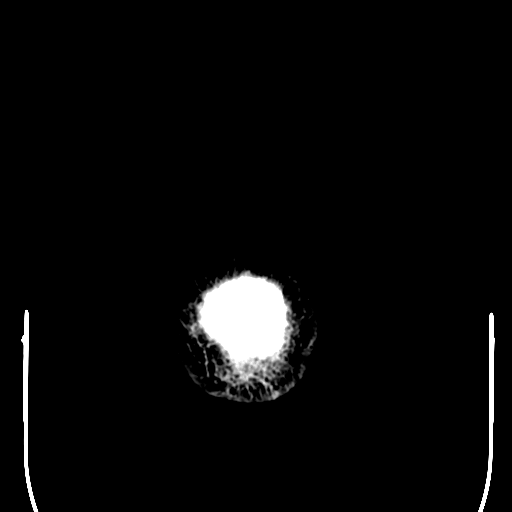
[im 32/35  bone]
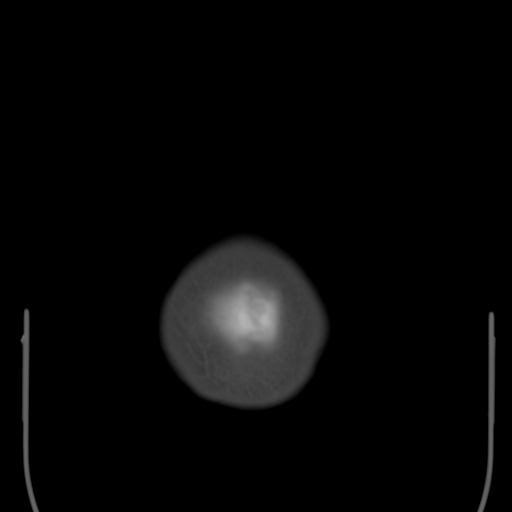

[Series 4: coronal soft tissue · coronal · 0.34mm/px · 3 of 74 slices shown]
[im 25/74  brain]
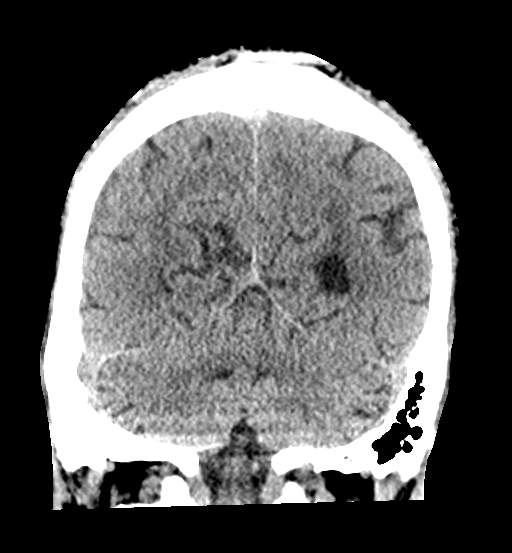
[im 33/74  brain]
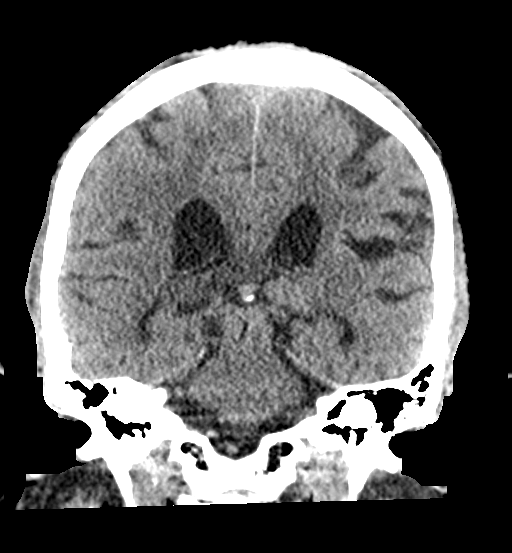
[im 41/74  brain]
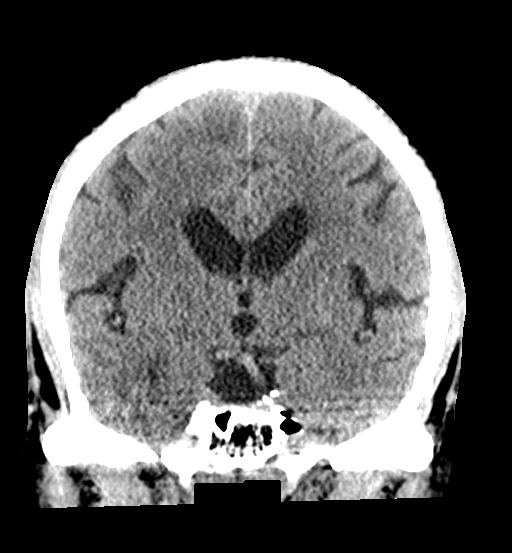

[Series 5: sagittal soft tissue · sagittal · 0.37mm/px · 3 of 58 slices shown]
[im 20/58  brain]
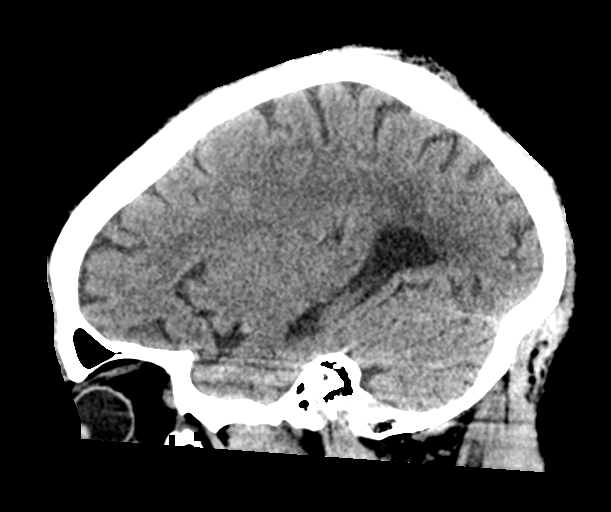
[im 29/58  brain]
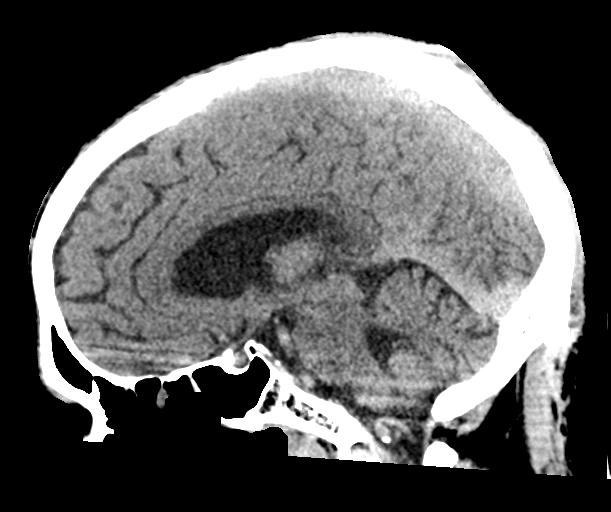
[im 39/58  brain]
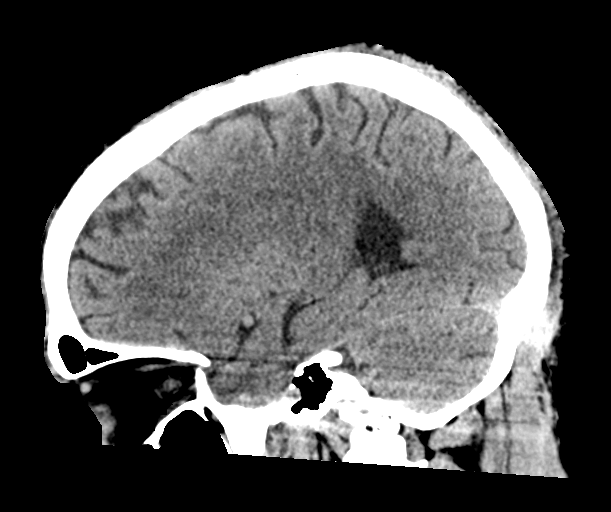

[15 of 47 positions shown; findings below may reference images not displayed]

FINDINGS: Brain: No evidence of acute infarction, hemorrhage, hydrocephalus,
extra-axial collection, visible mass lesion or mass effect.
Symmetric prominence of the ventricles, cisterns and sulci
compatible with parenchymal volume loss. Patchy areas of white
matter hypoattenuation are most compatible with chronic
microvascular angiopathy.

Vascular: Atherosclerotic calcification of the carotid siphons and
intradural vertebral arteries. No hyperdense vessel.

Skull: Mild high midline parietal scalp thickening. No subjacent
calvarial fracture or acute osseous abnormality. Remote right lamina
papyracea fracture.

Sinuses/Orbits: Mild mural thickening in the paranasal sinuses.
Slight asymmetric pneumatization of the mastoids with bilateral
pneumatization of the petrous apices. No mastoid effusion or
concerning acute abnormality. No new cavities are clear. Debris
noted in left external auditory canal. Included orbital structures
are unremarkable.

Other: None.
IMPRESSION: High midline parietal scalp thickening, correlate for contusive
change. No calvarial fracture.

No acute intracranial abnormality.

Background of parenchymal volume loss, microvascular angiopathy and
intracranial atherosclerosis.

Remote right lamina papyracea fracture.

## 2020-09-03 IMAGING — CT CT ABD-PELV W/ CM
2 of 5 series · 14 of 46 positions shown, 16 images · IV contrast (APPLIED)
Comparison: CT [DATE].

CLINICAL DATA: Suicidal ideation, nonlocalized abdominal pain,
recent fall

EXAM:
CT ABDOMEN AND PELVIS WITH CONTRAST
TECHNIQUE: Multidetector CT imaging of the abdomen and pelvis was performed
using the standard protocol following bolus administration of
intravenous contrast.
CONTRAST:  100mL OMNIPAQUE IOHEXOL 300 MG/ML  SOLN

[Series 2: routine abd/pel with · axial · 0.79mm/px · z∈[-964,-484]mm · 11 of 110 slices shown, 13 images]
[im 7/110  soft-tissue]
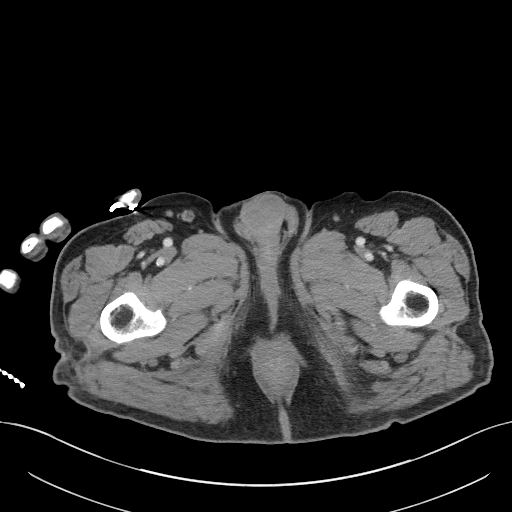
[im 7/110  bone]
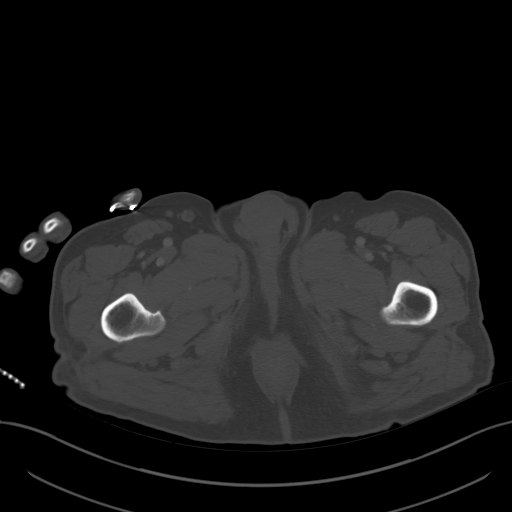
[im 19/110  soft-tissue]
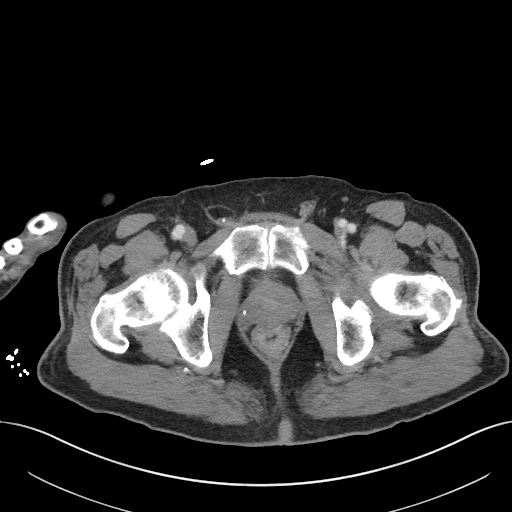
[im 25/110  soft-tissue]
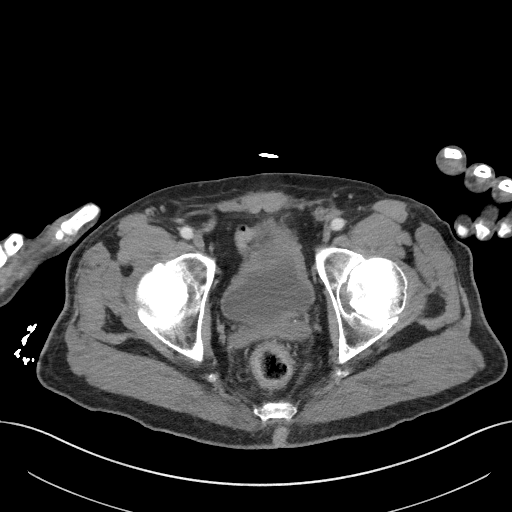
[im 37/110  soft-tissue]
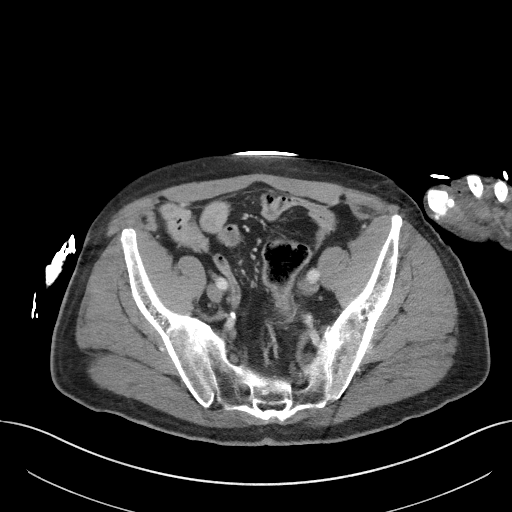
[im 43/110  soft-tissue]
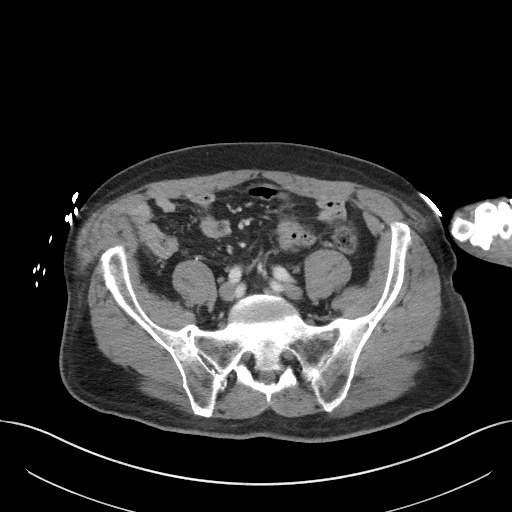
[im 55/110  soft-tissue]
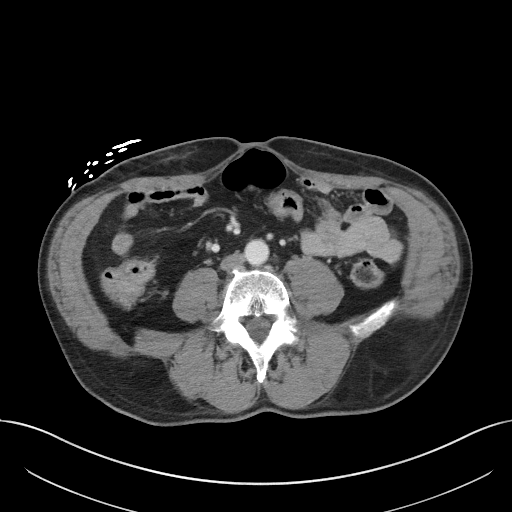
[im 67/110  soft-tissue]
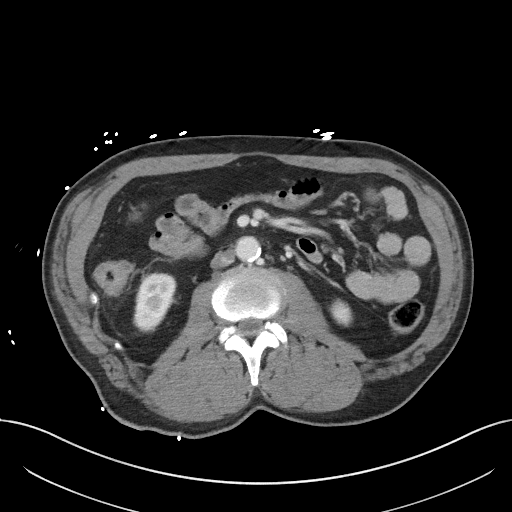
[im 73/110  soft-tissue]
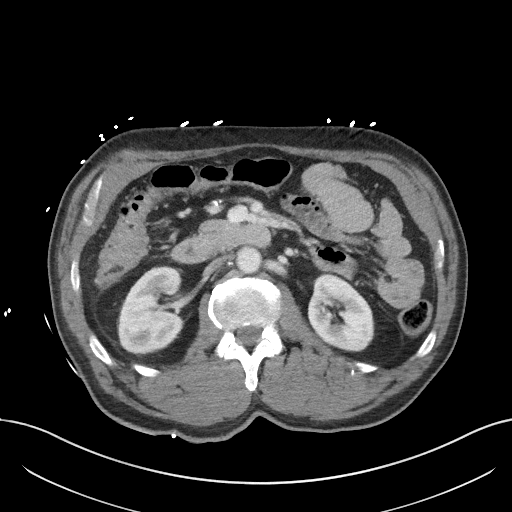
[im 85/110  soft-tissue]
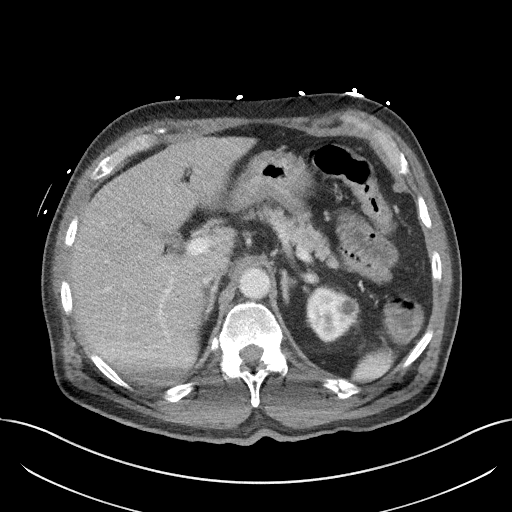
[im 85/110  bone]
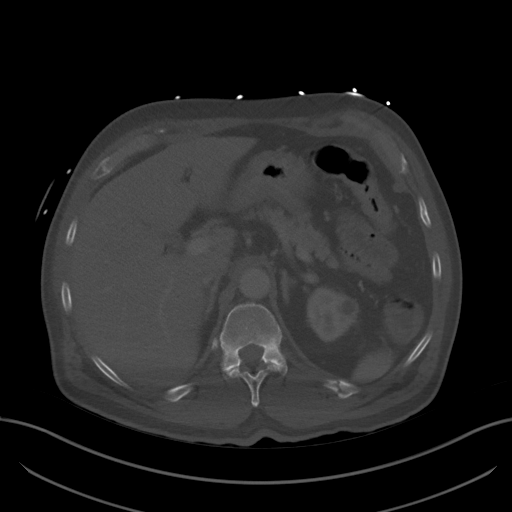
[im 91/110  soft-tissue]
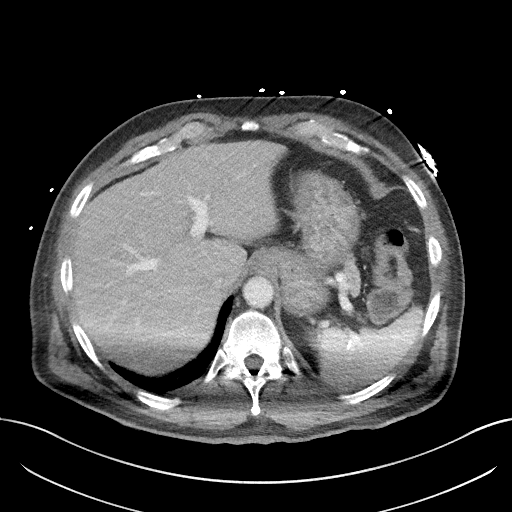
[im 103/110  soft-tissue]
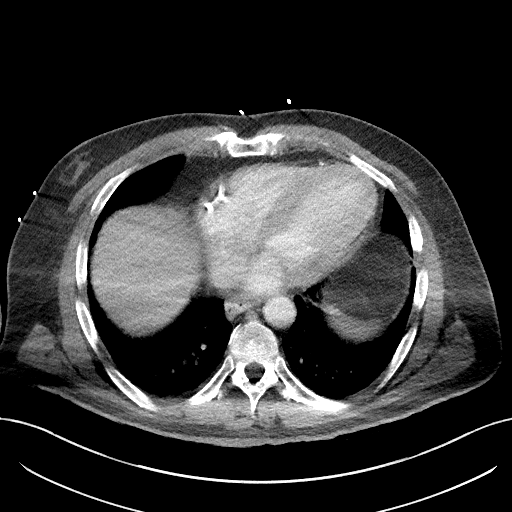

[Series 5: coronal st · coronal · 0.69mm/px · 3 of 90 slices shown]
[im 30/90  soft-tissue]
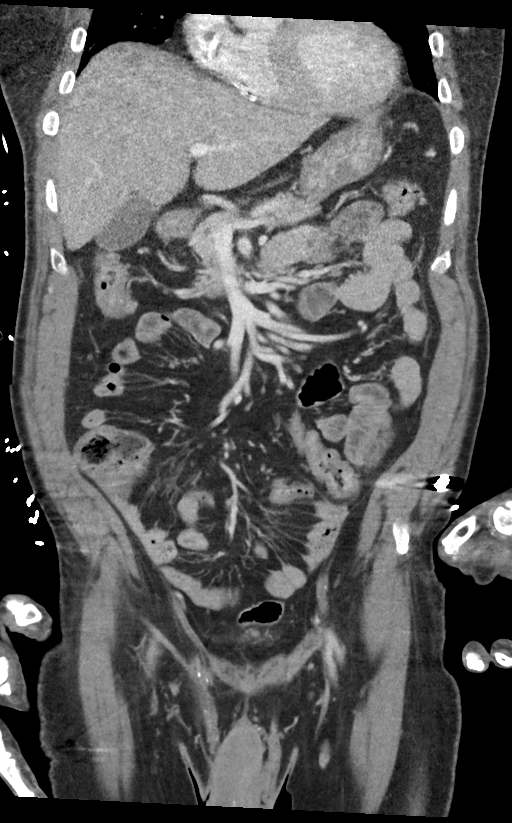
[im 40/90  soft-tissue]
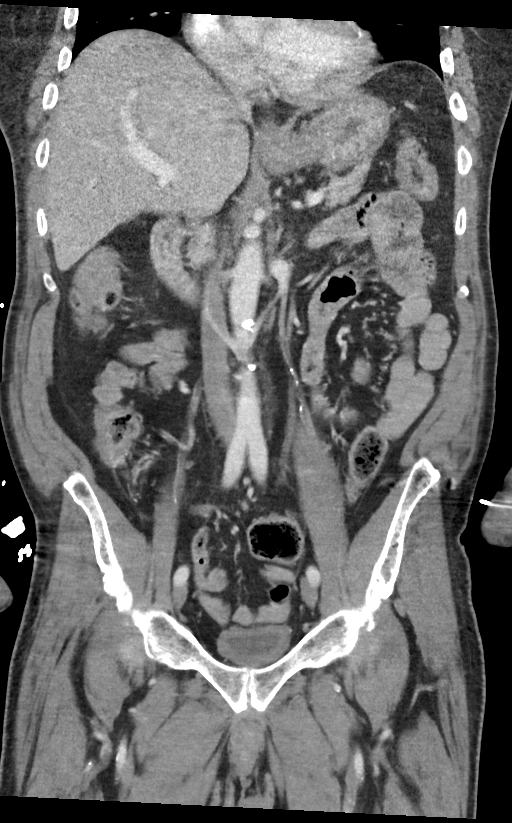
[im 50/90  soft-tissue]
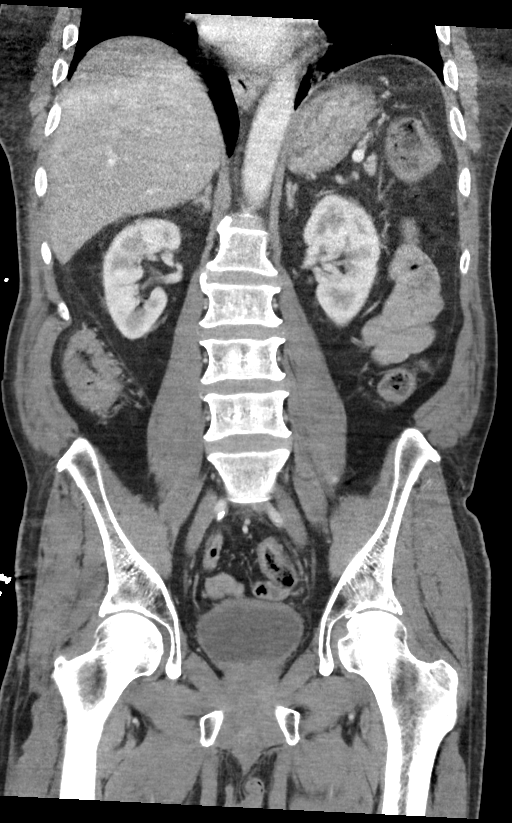

[14 of 46 positions shown; findings below may reference images not displayed]

FINDINGS: Lower chest: Ground-glass opacities in the lung bases much of which
is favored to be atelectatic, accentuated by motion artifact and
underlying mosaic attenuation. Cardiomegaly. Coronary artery
atherosclerosis.

Hepatobiliary: Diffuse hepatic hypoattenuation compatible with
hepatic steatosis. Smooth surface contour. No concerning focal liver
lesion. Normal gallbladder and biliary tree.

Pancreas: No pancreatic ductal dilatation or surrounding
inflammatory changes.

Spleen: Normal in size. No concerning splenic lesions. Small
accessory splenule.

Adrenals/Urinary Tract: Normal adrenals. Kidneys are normally
located with symmetric enhancement and excretion. Small fluid
attenuation cyst in the upper pole left kidney is unchanged from
prior. Mild chronic symmetric bilateral perinephric stranding, often
seen with advanced age or diminished renal function. No suspicious
renal lesion, urolithiasis or hydronephrosis. Circumferential
thickening of the urinary bladder, greater than expected for mere
underdistention.

Stomach/Bowel: Distal esophagus is unremarkable prominence of the
gastric rugae a likely related to underdistention. Duodenum with a
normal sweep across the midline abdomen. Proximal small bowel is
unremarkable. There is some circumferential thickening and hazy
stranding about the terminal ileum as well as more pancolonic mural
thickening with hazy pericolonic stranding. No resulting
obstruction. No extraluminal gas or organized collection. No
pneumatosis or portal venous gas. Appendix courses from the cecal
tip towards the midline abdomen without focal periappendiceal
inflammation.

Vascular/Lymphatic: Atherosclerotic calcifications within the
abdominal aorta and branch vessels. No aneurysm or ectasia. No
enlarged abdominopelvic lymph nodes.

Reproductive: The prostate and seminal vesicles are unremarkable.

Other: Ventral rectus diastasis. No bowel containing hernias. Small
fat containing right inguinal hernia.

Musculoskeletal: Multilevel degenerative changes are present in the
imaged portions of the spine. No acute osseous abnormality or
suspicious osseous lesion.
IMPRESSION: Distal ileal and pancolonic mural thickening with hazy stranding
concerning for features of a nonspecific ileocolitis.

Coronary atherosclerosis, cardiomegaly.

Aortic Atherosclerosis ([NG]-[NG]).

## 2020-09-03 MED ORDER — FOLIC ACID 1 MG PO TABS
1.0000 mg | ORAL_TABLET | Freq: Every day | ORAL | Status: DC
  Administered 2020-09-03 – 2020-09-04 (×2): 1 mg via ORAL
  Filled 2020-09-03 (×2): qty 1

## 2020-09-03 MED ORDER — THIAMINE HCL 100 MG PO TABS
500.0000 mg | ORAL_TABLET | Freq: Every day | ORAL | Status: DC
  Administered 2020-09-03 – 2020-09-04 (×2): 500 mg via ORAL
  Filled 2020-09-03 (×2): qty 5

## 2020-09-03 MED ORDER — LACTATED RINGERS IV BOLUS
1000.0000 mL | Freq: Once | INTRAVENOUS | Status: AC
  Administered 2020-09-03: 1000 mL via INTRAVENOUS

## 2020-09-03 MED ORDER — IOHEXOL 300 MG/ML  SOLN
100.0000 mL | Freq: Once | INTRAMUSCULAR | Status: AC | PRN
  Administered 2020-09-03: 100 mL via INTRAVENOUS

## 2020-09-03 MED ORDER — METRONIDAZOLE 500 MG PO TABS: 500.0000 mg | ORAL_TABLET | Freq: Three times a day (TID) | ORAL | 0 refills | Status: DC

## 2020-09-03 MED ORDER — PIPERACILLIN-TAZOBACTAM 3.375 G IVPB 30 MIN
3.3750 g | Freq: Once | INTRAVENOUS | Status: AC
  Administered 2020-09-03: 3.375 g via INTRAVENOUS
  Filled 2020-09-03: qty 50

## 2020-09-03 MED ORDER — AMOXICILLIN-POT CLAVULANATE 875-125 MG PO TABS: 1.0000 | ORAL_TABLET | Freq: Two times a day (BID) | ORAL | 0 refills | Status: DC

## 2020-09-03 NOTE — ED Provider Notes (Signed)
[facility] Emergency Department Provider Note  ____________________________________________  Time seen: Approximately 10:28 PM  I have reviewed the triage vital signs and the nursing notes.   HISTORY  Chief Complaint Generalized weakness   HPI Steve Alvarado is a 65 y.o. male who was discharged from ED to [facility] this morning, on arrival there they found that he was very weak and were worried about an acute medical issue.  They sent him back to the ED for repeat evaluation.  Patient denies any acute complaints.  No new falls or trauma.  Results from recent ED visit reviewed including labs this morning which were all unremarkable, CT head which was unremarkable.   Past Medical History:  Diagnosis Date   Coronary artery disease    Diabetes mellitus    Type II   Hyperlipidemia    Hypertension    Thyroid disease      Patient Active Problem List   Diagnosis Date Noted   MDD (major depressive disorder), recurrent episode, severe (HCC) 09/02/2020   Syncope 08/06/2020   Intractable abdominal pain 03/08/2015   Hypertensive urgency 03/08/2015   Intractable nausea and vomiting 03/07/2015   HTN (hypertension) 02/14/2011   CAD (coronary artery disease) 08/13/2010   Stented coronary artery 08/13/2010   Diabetes mellitus (HCC) 08/13/2010   Hyperlipemia 08/13/2010     Past Surgical History:  Procedure Laterality Date   CARDIAC CATHETERIZATION  08/06/2010   Bare metal stent placed in RCA.   HAND SURGERY     left     Prior to Admission medications   Medication Sig Start Date End Date Taking? Authorizing Provider  amLODipine (NORVASC) 10 MG tablet Take 10 mg by mouth daily. 08/18/20   [provider]  amoxicillin-clavulanate (AUGMENTIN) 875-125 MG tablet Take 1 tablet by mouth 2 (two) times daily for 10 days. 09/03/20 09/13/20  Nita Sickle, MD  atorvastatin (LIPITOR) 40 MG tablet Take 1 tablet (40 mg total) by mouth every  evening. 08/10/20 09/09/20  Charise Killian, MD  citalopram (CELEXA) 20 MG tablet Take 1 tablet (20 mg total) by mouth daily. 08/11/20 09/10/20  Charise Killian, MD  gabapentin (NEURONTIN) 300 MG capsule Take 900 mg by mouth 2 (two) times daily. 07/24/20   [provider]  hydrochlorothiazide (HYDRODIURIL) 25 MG tablet Take 1 tablet (25 mg total) by mouth daily. 08/10/20 09/09/20  Charise Killian, MD  insulin glargine (LANTUS) 100 unit/mL SOPN Inject 60 Units into the skin 2 (two) times daily.    [provider]  levothyroxine (SYNTHROID) 50 MCG tablet Take 50 mcg by mouth daily. 08/18/20   [provider]  liraglutide (VICTOZA) 18 MG/3ML SOPN Inject 1.2 mg into the skin in the morning. 08/18/20 08/18/21  [provider]  losartan (COZAAR) 100 MG tablet Take 100 mg by mouth daily. 08/18/20   [provider]  metFORMIN (GLUCOPHAGE) 1000 MG tablet Take 1,000 mg by mouth 2 (two) times daily. 08/18/20   [provider]  metroNIDAZOLE (FLAGYL) 500 MG tablet Take 1 tablet (500 mg total) by mouth 3 (three) times daily for 10 days. 09/03/20 09/13/20  Nita Sickle, MD  mirtazapine (REMERON) 15 MG tablet Take 7.5 mg by mouth daily. 08/18/20   [provider]  oxyCODONE-acetaminophen (PERCOCET) 7.5-325 MG tablet Take 1 tablet by mouth 2 (two) times daily as needed. 08/15/20   [provider]     Allergies Ace inhibitors   Family History  Problem Relation Age of Onset  Heart attack Mother     Social History Social History   Tobacco Use   Smoking status: Never   Smokeless tobacco: Never  Substance Use Topics   Alcohol use: No   Drug use: Not Currently    Review of Systems  Constitutional:   No fever or chills.  ENT:   No sore throat. No rhinorrhea. Cardiovascular:   No chest pain or syncope. Respiratory:   No dyspnea or cough. Gastrointestinal:   Negative for abdominal pain, vomiting and diarrhea.  Musculoskeletal:    Negative for focal pain or swelling All other systems reviewed and are negative except as documented above in ROS and HPI.  ____________________________________________   PHYSICAL EXAM:  VITAL SIGNS: ED Triage Vitals  Enc Vitals Group     BP 09/03/20 2016 (!) 147/76     Pulse Rate 09/03/20 2016 80     Resp 09/03/20 2016 20     Temp 09/03/20 2016 98.7 F (37.1 C)     Temp Source 09/03/20 2016 Oral     SpO2 09/03/20 2016 98 %     Weight --      Height --      Head Circumference --      Peak Flow --      Pain Score 09/03/20 2022 6     Pain Loc --      Pain Edu? --      Excl. in GC? --     Vital signs reviewed, nursing assessments reviewed.   Constitutional:   Alert and oriented. Non-toxic appearance. Eyes:   Conjunctivae are normal. EOMI. PERRL. ENT      Head:   Normocephalic and atraumatic.      Nose:   Wearing a mask.      Mouth/Throat:   Wearing a mask.      Neck:   No meningismus. Full ROM. Hematological/Lymphatic/Immunilogical:   No cervical lymphadenopathy. Cardiovascular:   RRR. Symmetric bilateral radial and DP pulses.  No murmurs. Cap refill less than 2 seconds. Respiratory:   Normal respiratory effort without tachypnea/retractions. Breath sounds are clear and equal bilaterally. No wheezes/rales/rhonchi. Gastrointestinal:   Soft and nontender. Non distended. There is no CVA tenderness.  No rebound, rigidity, or guarding. Genitourinary:   deferred Musculoskeletal:   Normal range of motion in all extremities. No joint effusions.  No lower extremity tenderness.  No edema. Neurologic:   Normal speech and language.  Motor grossly intact. No acute focal neurologic deficits are appreciated.  Skin:    Skin is warm, dry and intact. No rash noted.  No petechiae, purpura, or bullae.  ____________________________________________    LABS (pertinent positives/negatives) (all labs ordered are listed, but only abnormal results are displayed) Labs Reviewed - No data to  display ____________________________________________   EKG    ____________________________________________    RADIOLOGY  CT Head Wo Contrast  Result Date: 09/03/2020 CLINICAL DATA:  Suicidal ideation, recent fall EXAM: CT HEAD WITHOUT CONTRAST TECHNIQUE: Contiguous axial images were obtained from the base of the skull through the vertex without intravenous contrast. COMPARISON:  CT 09/02/2020 FINDINGS: Brain: No evidence of acute infarction, hemorrhage, hydrocephalus, extra-axial collection, visible mass lesion or mass effect. Symmetric prominence of the ventricles, cisterns and sulci compatible with parenchymal volume loss. Patchy areas of white matter hypoattenuation are most compatible with chronic microvascular angiopathy. Vascular: Atherosclerotic calcification of the carotid siphons and intradural vertebral arteries. No hyperdense vessel. Skull: Mild high midline parietal scalp thickening. No subjacent calvarial fracture or acute osseous abnormality.  Remote right lamina papyracea fracture. Sinuses/Orbits: Mild mural thickening in the paranasal sinuses. Slight asymmetric pneumatization of the mastoids with bilateral pneumatization of the petrous apices. No mastoid effusion or concerning acute abnormality. No new cavities are clear. Debris noted in left external auditory canal. Included orbital structures are unremarkable. Other: None. IMPRESSION: High midline parietal scalp thickening, correlate for contusive change. No calvarial fracture. No acute intracranial abnormality. Background of parenchymal volume loss, microvascular angiopathy and intracranial atherosclerosis. Remote right lamina papyracea fracture. Electronically Signed   By: Kreg ShropshirePrice  DeHay M.D.   On: 09/03/2020 06:35   CT ABDOMEN PELVIS W CONTRAST  Result Date: 09/03/2020 CLINICAL DATA:  Suicidal ideation, nonlocalized abdominal pain, recent fall EXAM: CT ABDOMEN AND PELVIS WITH CONTRAST TECHNIQUE: Multidetector CT imaging of the  abdomen and pelvis was performed using the standard protocol following bolus administration of intravenous contrast. CONTRAST:  100mL OMNIPAQUE IOHEXOL 300 MG/ML  SOLN COMPARISON:  CT 03/07/2015. FINDINGS: Lower chest: Ground-glass opacities in the lung bases much of which is favored to be atelectatic, accentuated by motion artifact and underlying mosaic attenuation. Cardiomegaly. Coronary artery atherosclerosis. Hepatobiliary: Diffuse hepatic hypoattenuation compatible with hepatic steatosis. Smooth surface contour. No concerning focal liver lesion. Normal gallbladder and biliary tree. Pancreas: No pancreatic ductal dilatation or surrounding inflammatory changes. Spleen: Normal in size. No concerning splenic lesions. Small accessory splenule. Adrenals/Urinary Tract: Normal adrenals. Kidneys are normally located with symmetric enhancement and excretion. Small fluid attenuation cyst in the upper pole left kidney is unchanged from prior. Mild chronic symmetric bilateral perinephric stranding, often seen with advanced age or diminished renal function. No suspicious renal lesion, urolithiasis or hydronephrosis. Circumferential thickening of the urinary bladder, greater than expected for mere underdistention. Stomach/Bowel: Distal esophagus is unremarkable prominence of the gastric rugae a likely related to underdistention. Duodenum with a normal sweep across the midline abdomen. Proximal small bowel is unremarkable. There is some circumferential thickening and hazy stranding about the terminal ileum as well as more pancolonic mural thickening with hazy pericolonic stranding. No resulting obstruction. No extraluminal gas or organized collection. No pneumatosis or portal venous gas. Appendix courses from the cecal tip towards the midline abdomen without focal periappendiceal inflammation. Vascular/Lymphatic: Atherosclerotic calcifications within the abdominal aorta and branch vessels. No aneurysm or ectasia. No enlarged  abdominopelvic lymph nodes. Reproductive: The prostate and seminal vesicles are unremarkable. Other: Ventral rectus diastasis. No bowel containing hernias. Small fat containing right inguinal hernia. Musculoskeletal: Multilevel degenerative changes are present in the imaged portions of the spine. No acute osseous abnormality or suspicious osseous lesion. IMPRESSION: Distal ileal and pancolonic mural thickening with hazy stranding concerning for features of a nonspecific ileocolitis. Coronary atherosclerosis, cardiomegaly. Aortic Atherosclerosis (ICD10-I70.0). Electronically Signed   By: Kreg ShropshirePrice  DeHay M.D.   On: 09/03/2020 06:48    ____________________________________________   PROCEDURES Procedures  ____________________________________________    CLINICAL IMPRESSION / ASSESSMENT AND PLAN / ED COURSE  Medications ordered in the ED: Medications  folic acid (FOLVITE) tablet 1 mg (1 mg Oral Given 09/03/20 2132)  thiamine tablet 500 mg (500 mg Oral Given 09/03/20 2132)    Pertinent labs & imaging results that were available during my care of the patient were reviewed by me and considered in my medical decision making (see chart for details).  Steve Alvarado was evaluated in Emergency Department on 09/03/2020 for the symptoms described in the history of present illness. He was evaluated in the context of the global COVID-19 pandemic, which necessitated consideration that the patient might be at risk  for infection with the SARS-CoV-2 virus that causes COVID-19. Institutional protocols and algorithms that pertain to the evaluation of patients at risk for COVID-19 are in a state of rapid change based on information released by regulatory bodies including the CDC and federal and state organizations. These policies and algorithms were followed during the patient's care in the ED.   Patient sent back to the ED for repeat assessment.  No acute symptoms.  Vital signs are normal, exam is unremarkable.  No  need to repeat labs since he had labs done just 12 hours ago.  No new imaging needed.  He is medically stable.  Will consult PT and social work for further assistance with placement.      ____________________________________________   FINAL CLINICAL IMPRESSION(S) / ED DIAGNOSES    Final diagnoses:  Generalized weakness  Type 2 diabetes mellitus without complication, with long-term current use of insulin [facility])     ED Discharge Orders     None       Portions of this note were generated with dragon dictation software. Dictation errors may occur despite best attempts at proofreading.   Sharman Cheek, MD 09/03/20 2231

## 2020-09-03 NOTE — ED Notes (Signed)
Hourly rounding reveals patient in room. No complaints, stable, in no acute distress. Q15 minute rounds and monitoring via Security Cameras to continue. 

## 2020-09-03 NOTE — ED Notes (Signed)
Pt noted to have sustained SVT on monitors with HR between 150-160s. EKG printed and handed to EDP. Pt then noted to convert back into sinus rhythm.

## 2020-09-03 NOTE — ED Notes (Addendum)
Patient was walking to the bathroom and stumbled and slipped to the floor. He defecated on himself. He said he doesn't know what happened. VS taken WNL and BG 127. Patient is alert and oriented X 4. He was transferred to Room 26 via stretcher accompanied by charge nurse and EDP.

## 2020-09-03 NOTE — ED Notes (Signed)
Tried to call report to [facility]. RN will call this nurse back.

## 2020-09-03 NOTE — ED Notes (Signed)
Report to include Situation, Background, Assessment, and Recommendations received from Melina Copa. Patient alert and oriented, warm and dry, in no acute distress. Patient reported SI without a plan. Contracted for safety. Denied H I, AVH and pain. Patient made aware of Q15 minute rounds and security cameras for their safety. Patient instructed to come to me with needs or concerns.

## 2020-09-03 NOTE — ED Notes (Signed)
VOL/pending medical clearance.

## 2020-09-03 NOTE — ED Notes (Signed)
VOL/ Pending Transfer this Am

## 2020-09-03 NOTE — ED Notes (Signed)
Lab called. New green top needed. Will draw and send.

## 2020-09-03 NOTE — ED Notes (Signed)
SAFE  TRANSPORT  CALLED  TO  TAKE  PT  TO  [facility]  INFORMED  REINA  RN

## 2020-09-03 NOTE — ED Notes (Signed)
Pt taken to CT.

## 2020-09-03 NOTE — ED Triage Notes (Signed)
Pt was dc'd from ed yesterday to go to a psychiatric facility. Was sent back to ed today with concerns of lethargy. They believe his issues are medical and not psychiatric and would like him to be evaluated.

## 2020-09-03 NOTE — ED Provider Notes (Signed)
-----------------------------------------   7:07 AM on 09/03/2020 -----------------------------------------  Abdominal CT significant for nonspecific ileocolitis, likely the source of patient's abdominal pain.  He will be given a dose of IV Zosyn and started on oral antibiotics per Dr. Don Perking.  He may be medically cleared for psychiatric admission.   Chesley Noon, MD 09/03/20 [phone]

## 2020-09-03 NOTE — ED Notes (Signed)
Tried to call report to [facility] at [phone]. No answer.

## 2020-09-03 NOTE — ED Provider Notes (Signed)
Patient has been in the [facility] for 25 hours awaiting voluntary placement for depression and cocaine abuse.  Patient was getting up to go to the bathroom when he had a syncopal vs fall event, patient not sure.  Patient soiled in stool reports having diarrhea today, this is his second episode.  Patient is complaining of head pain and pain in the left lateral abdominal area which is tender to palpation. Head is atraumatic.  No midline CTL spine tenderness.  Full painless range of motion of all joints including bilateral hips.  Patient denies chest pain, shortness of breath, back pain, nausea, vomiting.    ED ECG REPORT I, Nita Sickle, the attending physician, personally viewed and interpreted this ECG.  Normal sinus rhythm with a rate of 68, normal intervals, normal axis, no ST elevations or depressions.    Blood glucose normal at 127.  Labs, head CT, and CT a/p pending.  We will send stool for C. difficile.   _________________________ 5:52 AM on 09/03/2020 ----------------------------------------- Patient with a < 1 min run of SVT which self-resolved prior to intervention. Patient asymptomatic from it.    _________________________ 7:16 AM on 09/03/2020 ----------------------------------------- Head CT negative for traumatic injury.  CT abdomen pelvis consistent with ileocolitis for which patient will be given a dose of Zosyn and will need to be discharged on Augmentin and Flagyl.  BMP is pending.  Care transferred to Dr. Larinda Buttery.   Nita Sickle, MD 09/03/20 [phone]

## 2020-09-03 NOTE — ED Notes (Signed)
Unable to draw green top. Called lab to come draw.

## 2020-09-03 NOTE — ED Notes (Signed)
Tried to call report at [facility] at [phone]. Reached staff however there were no RNs available to take report because they were "in a code". Will call back.

## 2020-09-04 ENCOUNTER — Emergency Department
Admission: EM | Admit: 2020-09-04 | Discharge: 2020-09-05 | Disposition: A | Discharge status: Home / Self Care | Payer: Medicare Other | Attending: Student in an Organized Health Care Education/Training Program | Admitting: Student in an Organized Health Care Education/Training Program

## 2020-09-04 ENCOUNTER — Emergency Department: Payer: Medicare Other

## 2020-09-04 ENCOUNTER — Encounter: Payer: Self-pay | Admitting: Emergency Medicine

## 2020-09-04 DIAGNOSIS — I10 Essential (primary) hypertension: Secondary | ICD-10-CM | POA: Insufficient documentation

## 2020-09-04 DIAGNOSIS — F4325 Adjustment disorder with mixed disturbance of emotions and conduct: Secondary | ICD-10-CM | POA: Diagnosis not present

## 2020-09-04 DIAGNOSIS — I2510 Atherosclerotic heart disease of native coronary artery without angina pectoris: Secondary | ICD-10-CM | POA: Diagnosis not present

## 2020-09-04 DIAGNOSIS — Y900 Blood alcohol level of less than 20 mg/100 ml: Secondary | ICD-10-CM | POA: Insufficient documentation

## 2020-09-04 DIAGNOSIS — Z046 Encounter for general psychiatric examination, requested by authority: Secondary | ICD-10-CM | POA: Diagnosis not present

## 2020-09-04 DIAGNOSIS — Z7984 Long term (current) use of oral hypoglycemic drugs: Secondary | ICD-10-CM | POA: Insufficient documentation

## 2020-09-04 DIAGNOSIS — Z79899 Other long term (current) drug therapy: Secondary | ICD-10-CM | POA: Diagnosis not present

## 2020-09-04 DIAGNOSIS — F322 Major depressive disorder, single episode, severe without psychotic features: Secondary | ICD-10-CM | POA: Diagnosis not present

## 2020-09-04 DIAGNOSIS — Z794 Long term (current) use of insulin: Secondary | ICD-10-CM | POA: Insufficient documentation

## 2020-09-04 DIAGNOSIS — E119 Type 2 diabetes mellitus without complications: Secondary | ICD-10-CM | POA: Diagnosis not present

## 2020-09-04 DIAGNOSIS — F332 Major depressive disorder, recurrent severe without psychotic features: Secondary | ICD-10-CM | POA: Diagnosis present

## 2020-09-04 DIAGNOSIS — F1410 Cocaine abuse, uncomplicated: Secondary | ICD-10-CM | POA: Diagnosis present

## 2020-09-04 DIAGNOSIS — F32A Depression, unspecified: Secondary | ICD-10-CM | POA: Diagnosis present

## 2020-09-04 DIAGNOSIS — F4329 Adjustment disorder with other symptoms: Secondary | ICD-10-CM | POA: Diagnosis present

## 2020-09-04 DIAGNOSIS — R45851 Suicidal ideations: Secondary | ICD-10-CM

## 2020-09-04 IMAGING — DX DG RIBS W/ CHEST 3+V*L*
5 series · 5 of 5 positions shown · non-contrast
Comparison: [DATE]

CLINICAL DATA: Left chest wall pain after fall

EXAM:
LEFT RIBS AND CHEST - 3+ VIEW

[rib obl (1 of 2)]
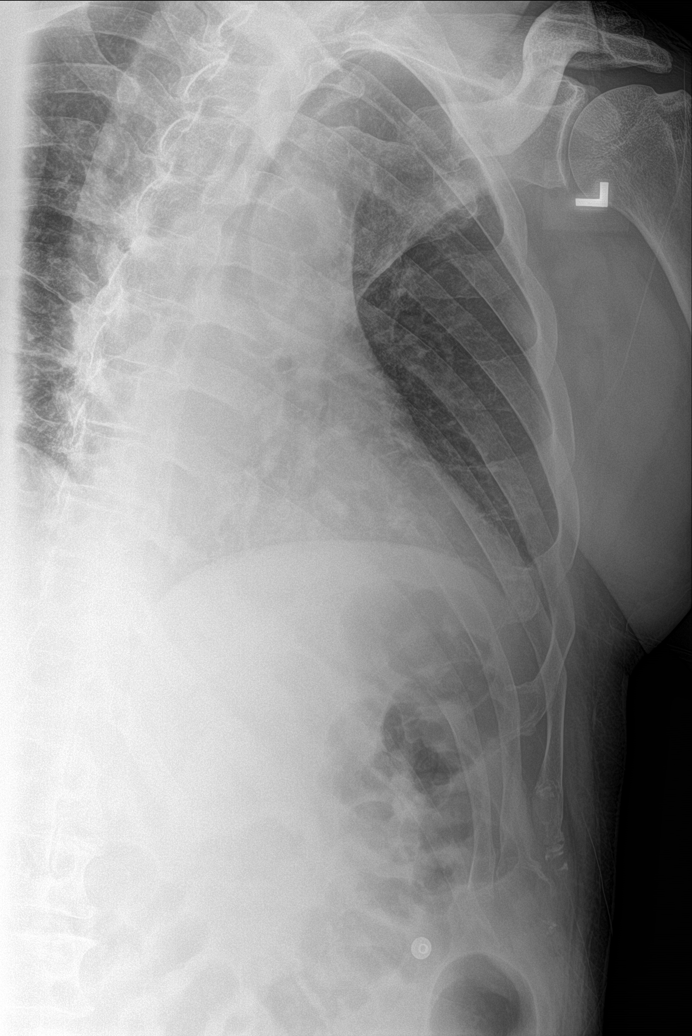

[rib obl (2 of 2)]
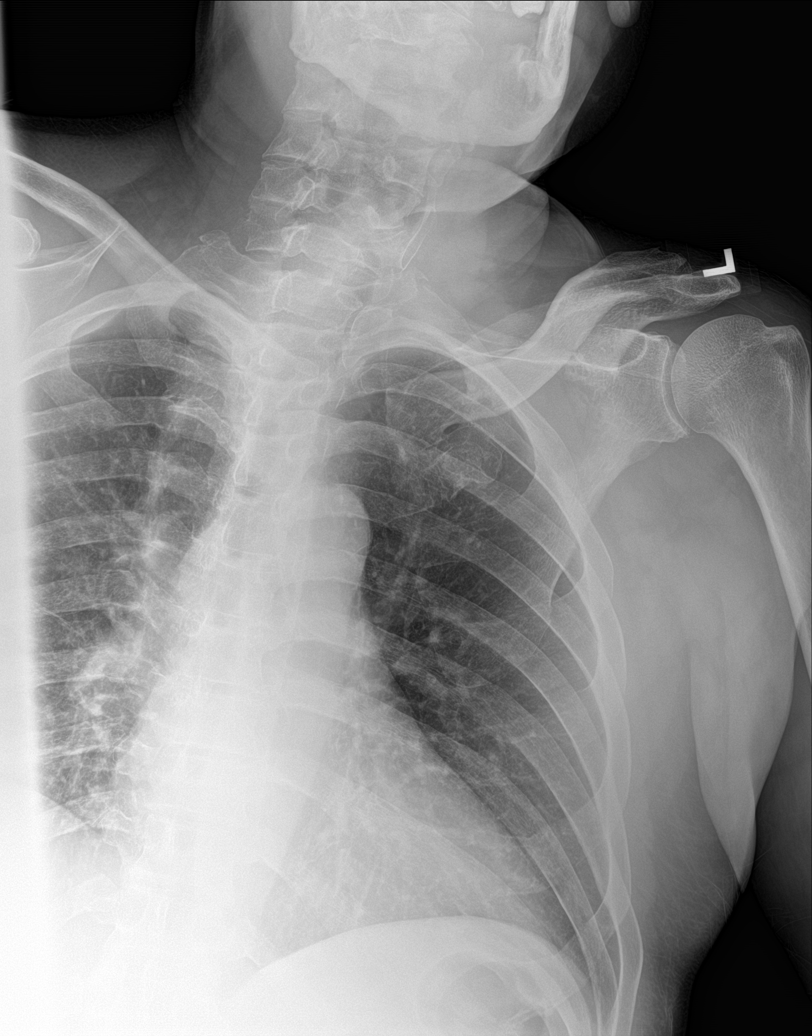

[rib pa (1 of 2)]
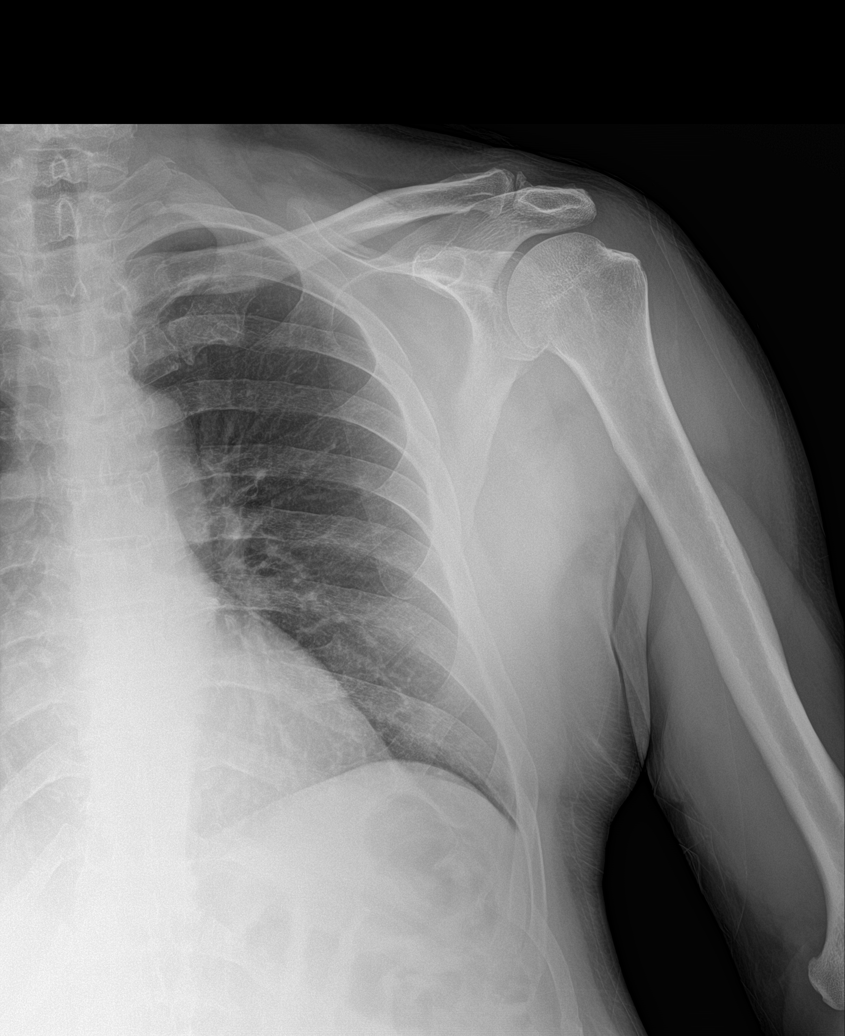

[chest ap]
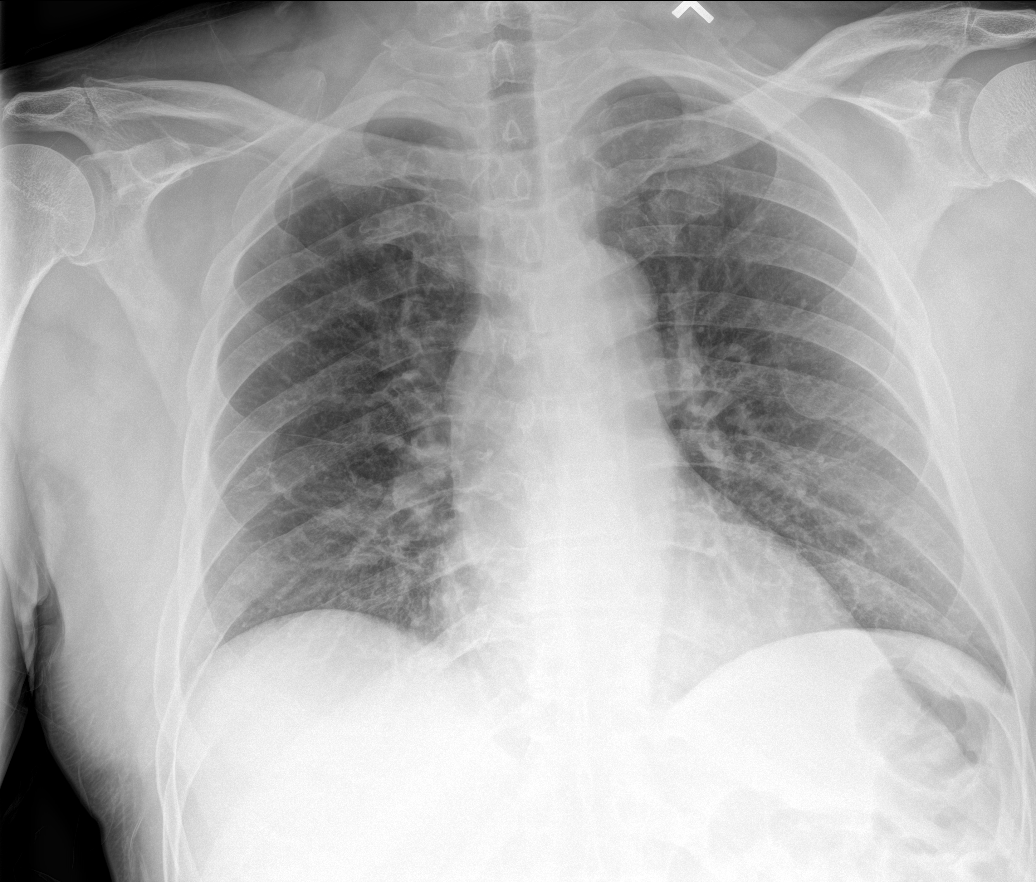

[rib pa (2 of 2)]
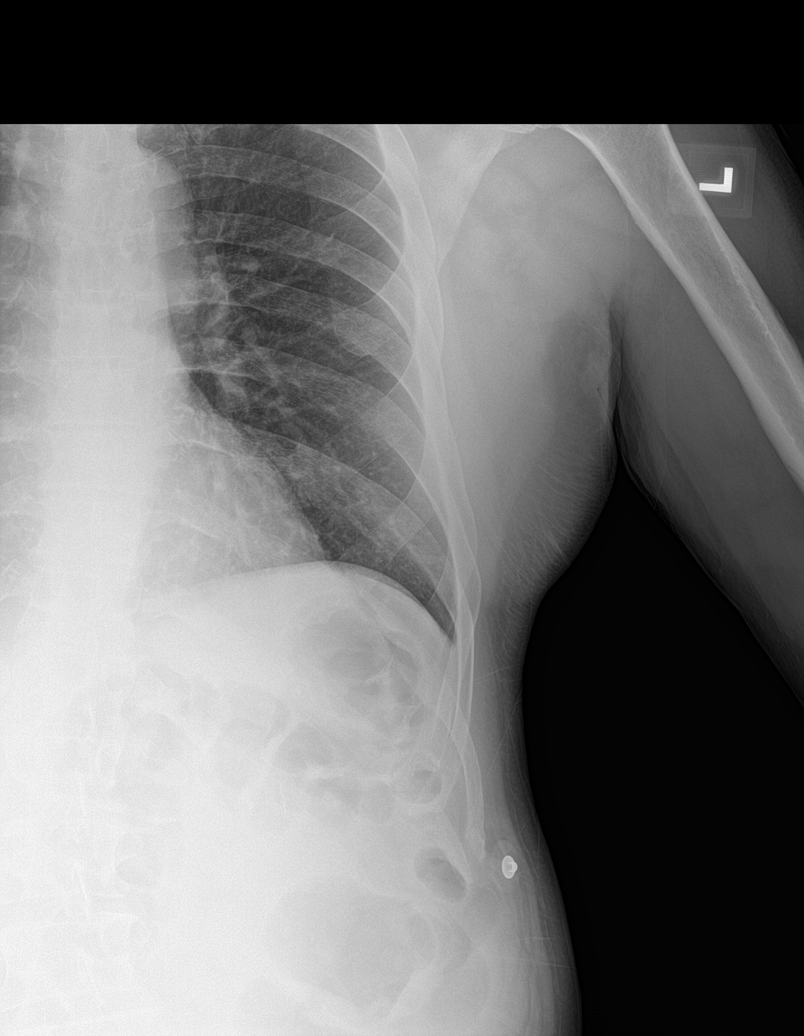

[5 of 5 positions shown; findings below may reference images not displayed]

FINDINGS: Frontal and oblique views of the left thoracic cage are obtained.
Frontal view of the chest was also performed. Cardiac silhouette is
unremarkable. No airspace disease, effusion, or pneumothorax. No
acute displaced fractures.
IMPRESSION: 1. No acute intrathoracic process.

## 2020-09-04 MED ORDER — CITALOPRAM HYDROBROMIDE 20 MG PO TABS: 20.0000 mg | ORAL_TABLET | Freq: Every day | ORAL | Status: DC

## 2020-09-04 MED ORDER — MIRTAZAPINE 15 MG PO TABS: 7.5000 mg | ORAL_TABLET | Freq: Every day | ORAL | Status: DC

## 2020-09-04 MED ORDER — KETOROLAC TROMETHAMINE 30 MG/ML IJ SOLN: 30.0000 mg | Freq: Once | INTRAMUSCULAR | Status: DC

## 2020-09-04 MED ORDER — METRONIDAZOLE 500 MG PO TABS: 500.0000 mg | ORAL_TABLET | Freq: Three times a day (TID) | ORAL | Status: DC

## 2020-09-04 MED ORDER — OXYCODONE-ACETAMINOPHEN 7.5-325 MG PO TABS: 1.0000 | ORAL_TABLET | Freq: Two times a day (BID) | ORAL | Status: DC | PRN

## 2020-09-04 MED ORDER — LOSARTAN POTASSIUM 50 MG PO TABS: 100.0000 mg | ORAL_TABLET | Freq: Every day | ORAL | Status: DC

## 2020-09-04 MED ORDER — HYDROCHLOROTHIAZIDE 25 MG PO TABS: 25.0000 mg | ORAL_TABLET | Freq: Every day | ORAL | Status: DC

## 2020-09-04 MED ORDER — AMOXICILLIN-POT CLAVULANATE 875-125 MG PO TABS: 1.0000 | ORAL_TABLET | Freq: Two times a day (BID) | ORAL | Status: DC

## 2020-09-04 MED ORDER — LEVOTHYROXINE SODIUM 50 MCG PO TABS: 50.0000 ug | ORAL_TABLET | Freq: Every day | ORAL | Status: DC

## 2020-09-04 MED ORDER — METFORMIN HCL 500 MG PO TABS: 1000.0000 mg | ORAL_TABLET | Freq: Two times a day (BID) | ORAL | Status: DC

## 2020-09-04 MED ORDER — GABAPENTIN 300 MG PO CAPS: 900.0000 mg | ORAL_CAPSULE | Freq: Two times a day (BID) | ORAL | Status: DC

## 2020-09-04 MED ORDER — ATORVASTATIN CALCIUM 20 MG PO TABS: 40.0000 mg | ORAL_TABLET | Freq: Every day | ORAL | Status: DC

## 2020-09-04 MED ORDER — ACETAMINOPHEN 500 MG PO TABS
1000.0000 mg | ORAL_TABLET | Freq: Once | ORAL | Status: AC
  Administered 2020-09-04: 1000 mg via ORAL
  Filled 2020-09-04: qty 2

## 2020-09-04 MED ORDER — ONDANSETRON HCL 4 MG/2ML IJ SOLN: 4.0000 mg | Freq: Once | INTRAMUSCULAR | Status: DC

## 2020-09-04 MED ORDER — AMLODIPINE BESYLATE 5 MG PO TABS: 10.0000 mg | ORAL_TABLET | Freq: Every day | ORAL | Status: DC

## 2020-09-04 NOTE — ED Notes (Signed)
Pt demanding to be discharged. Will let EDP know. When asked who would be picking up patient, he states "none of your business".

## 2020-09-04 NOTE — ED Notes (Addendum)
Pt demanding to be released. Pt refusing to tell staff where he plans to go on discharge. Walking down hallway in hospital clothing. Then returns to hallway bed and states that Jillyn Hidden, his son in law who is here and visiting the pt's wife, will take him home. Jillyn Hidden states that he will take patient.  Ambulating independently in hallway

## 2020-09-04 NOTE — ED Provider Notes (Signed)
[facility] Emergency Department Provider Note    Event Date/Time   First MD Initiated Contact with Patient 09/04/20 1927     (approximate)  I have reviewed the triage vital signs and the nursing notes.   HISTORY  Chief Complaint Mental Health Problem    HPI Steve Alvarado is a 65 y.o. male just seen in the ER for psychiatric illness here voluntarily Steve Alvarado presents to the ER after leaving earlier today feeling increasingly hopeless.  Patient initially told triage that he was having thoughts of harming himself without a plan but on my evaluation states he was saying that because he just wants to be able to go see his wife.  States that he "was lying just so I can see my wife.  "Does feel very withdrawn and hopeless and would like to speak to psychiatry.  Past Medical History:  Diagnosis Date   Coronary artery disease    Diabetes mellitus    Type II   Hyperlipidemia    Hypertension    Thyroid disease    Family History  Problem Relation Age of Onset   Heart attack Mother    Past Surgical History:  Procedure Laterality Date   CARDIAC CATHETERIZATION  08/06/2010   Bare metal stent placed in RCA.   HAND SURGERY     left   Patient Active Problem List   Diagnosis Date Noted   MDD (major depressive disorder), recurrent episode, severe (HCC) 09/02/2020   Syncope 08/06/2020   Intractable abdominal pain 03/08/2015   Hypertensive urgency 03/08/2015   Intractable nausea and vomiting 03/07/2015   HTN (hypertension) 02/14/2011   CAD (coronary artery disease) 08/13/2010   Stented coronary artery 08/13/2010   Diabetes mellitus (HCC) 08/13/2010   Hyperlipemia 08/13/2010      Prior to Admission medications   Medication Sig Start Date End Date Taking? Authorizing Provider  amLODipine (NORVASC) 10 MG tablet Take 10 mg by mouth daily. 08/18/20   [provider]  amoxicillin-clavulanate (AUGMENTIN) 875-125 MG tablet Take 1 tablet by mouth 2  (two) times daily for 10 days. 09/03/20 09/13/20  Nita Sickle, MD  atorvastatin (LIPITOR) 40 MG tablet Take 1 tablet (40 mg total) by mouth every evening. 08/10/20 09/09/20  Charise Killian, MD  citalopram (CELEXA) 20 MG tablet Take 1 tablet (20 mg total) by mouth daily. 08/11/20 09/10/20  Charise Killian, MD  gabapentin (NEURONTIN) 300 MG capsule Take 900 mg by mouth 2 (two) times daily. 07/24/20   [provider]  hydrochlorothiazide (HYDRODIURIL) 25 MG tablet Take 1 tablet (25 mg total) by mouth daily. 08/10/20 09/09/20  Charise Killian, MD  insulin glargine (LANTUS) 100 unit/mL SOPN Inject 60 Units into the skin 2 (two) times daily.    [provider]  levothyroxine (SYNTHROID) 50 MCG tablet Take 50 mcg by mouth daily. 08/18/20   [provider]  liraglutide (VICTOZA) 18 MG/3ML SOPN Inject 1.2 mg into the skin in the morning. 08/18/20 08/18/21  [provider]  losartan (COZAAR) 100 MG tablet Take 100 mg by mouth daily. 08/18/20   [provider]  metFORMIN (GLUCOPHAGE) 1000 MG tablet Take 1,000 mg by mouth 2 (two) times daily. 08/18/20   [provider]  metroNIDAZOLE (FLAGYL) 500 MG tablet Take 1 tablet (500 mg total) by mouth 3 (three) times daily for 10 days. 09/03/20 09/13/20  Nita Sickle, MD  mirtazapine (REMERON) 15 MG tablet Take 7.5 mg by mouth daily. 08/18/20   [provider]  oxyCODONE-acetaminophen (PERCOCET) 7.5-325 MG tablet Take 1 tablet by mouth 2 (two) times daily as needed. 08/15/20   [provider]    Allergies Ace inhibitors    Social History Social History   Tobacco Use   Smoking status: Never   Smokeless tobacco: Never  Substance Use Topics   Alcohol use: No   Drug use: Not Currently    Review of Systems Patient denies headaches, rhinorrhea, blurry vision, numbness, shortness of breath, chest pain, edema, cough, abdominal pain, nausea, vomiting, diarrhea, dysuria, fevers, rashes  or hallucinations unless otherwise stated above in HPI. ____________________________________________   PHYSICAL EXAM:  VITAL SIGNS: Vitals:   09/04/20 1838  BP: (!) 146/75  Pulse: 74  Resp: 17  Temp: 97.9 F (36.6 C)  SpO2: 99%    Constitutional: Alert and oriented.  Eyes: Conjunctivae are normal.  Head: Atraumatic. Nose: No congestion/rhinnorhea. Mouth/Throat: Mucous membranes are moist.   Neck: No stridor. Painless ROM.  Cardiovascular: Normal rate, regular rhythm. Grossly normal heart sounds.  Good peripheral circulation. Respiratory: Normal respiratory effort.  No retractions. Lungs CTAB. Gastrointestinal: Soft and nontender. No distention. No abdominal bruits. No CVA tenderness. Genitourinary:  Musculoskeletal: No lower extremity tenderness nor edema.  No joint effusions. Neurologic:  Normal speech and language. No gross focal neurologic deficits are appreciated. No facial droop Skin:  Skin is warm, dry and intact. No rash noted. Psychiatric: Mood and affect are normal. Speech and behavior are normal.  ____________________________________________   LABS (all labs ordered are listed, but only abnormal results are displayed)  No results found for this or any previous visit (from the past 24 hour(s)). ____________________________________________ ____________________________________________  RADIOLOGY   ____________________________________________   PROCEDURES  Procedure(s) performed:  Procedures    Critical Care performed: no ____________________________________________   INITIAL IMPRESSION / ASSESSMENT AND PLAN / ED COURSE  Pertinent labs & imaging results that were available during my care of the patient were reviewed by me and considered in my medical decision making (see chart for details).   DDX: Psychosis, delirium, medication effect, noncompliance, polysubstance abuse, Si, Hi, depression   Steve Alvarado is a 65 y.o. who presents to the ED  with for evaluation of depression and hopelessness.  Laboratory testing was ordered to evaluation for underlying electrolyte derangement or signs of underlying organic pathology to explain today's presentation.  Patient reporting suicidal ideation in triage but now stating he is lying.  Patient with some odd behavior.  He was initially threatening to leave therefore was put on her emergent IVC.  Given his presentation I do think that it is worth continuing his IVC for psychiatric evaluation due to recent stressor of his wife's hospitalization.   The patient has been placed in psychiatric observation due to the need to provide a safe environment for the patient while obtaining psychiatric consultation and evaluation, as well as ongoing medical and medication management to treat the patient's condition.  The patient has been placed under full IVC at this time.   The patient was evaluated in Emergency Department today for the symptoms described in the history of present illness. He/she was evaluated in the context of the global COVID-19 pandemic, which necessitated consideration that the patient might be at risk for infection with the SARS-CoV-2 virus that causes COVID-19. Institutional protocols and algorithms that pertain to the evaluation of patients at risk for COVID-19 are in a state of rapid change based on information released by regulatory bodies including the CDC and federal and state organizations. These policies  and algorithms were followed during the patient's care in the ED.  As part of my medical decision making, I reviewed the following data within the electronic MEDICAL RECORD NUMBER Nursing notes reviewed and incorporated, Labs reviewed, notes from prior ED visits and [place] Controlled Substance Database   ____________________________________________   FINAL CLINICAL IMPRESSION(S) / ED DIAGNOSES  Final diagnoses:  Suicidal ideation      NEW MEDICATIONS STARTED DURING THIS VISIT:  New  Prescriptions   No medications on file     Note:  This document was prepared using Dragon voice recognition software and may include unintentional dictation errors.    Willy Eddy, MD 09/04/20 [phone]

## 2020-09-04 NOTE — ED Notes (Addendum)
FIRST NURSE NOTE: Observed pt walking out to lobby from triage area, pt states he is leaving, states we can't help him, pt is upset about family situation where people have lied to him.  I attempted to redirect patient outside lobby doors to come back inside, pt here voluntarily, however, security notified, charge RN April and Dr. Roxan Hockey also notified.  Pt redirected back into ED and taken to room 23 on his own.  Pt agreed to go to a room to speak with the Dr.

## 2020-09-04 NOTE — Evaluation (Signed)
Physical Therapy Evaluation Patient Details Name: Steve Alvarado MRN: [id number] DOB: Dec 03, 1955 Today's Date: 09/04/2020   History of Present Illness  ABDULRAHEEM PINEO is a 65 y.o. male who presents to the ED from home voluntarily with a chief complaint of depression and feeling suicidal without plan.  Involved in an argument with his spouse yesterday; spouse is currently in the ED for behavioral evaluation as well.  Patient denies active SI/HI/AH/VH.  Voices no medical complaints.   Clinical Impression  Patient received up in recliner in hallway in ED. Patient is quiet. Minimally verbal initially. He is agreeable to PT assessment. Requires mod +2 assist for sit to stand from recliner. He reports left side pain with sit to stand. Patient ambulated 200 feet with RW and min guard +2 for safety due to falling in ED. Patient seems more pain limited than anything else with ambulation. He will continue to benefit from skilled PT while here to improve functional independence and safety.     Follow Up Recommendations Supervision/Assistance - 24 hour    Equipment Recommendations  Rolling walker with 5" wheels    Recommendations for Other Services       Precautions / Restrictions Precautions Precautions: Fall Precaution Comments: Patient has had 2 falls since admission in ED Restrictions Weight Bearing Restrictions: No      Mobility  Bed Mobility               General bed mobility comments: patient received in recliner and remained in recliner after walking    Transfers Overall transfer level: Needs assistance Equipment used: Rolling walker (2 wheeled) Transfers: Sit to/from Stand Sit to Stand: Mod assist;+2 physical assistance            Ambulation/Gait Ambulation/Gait assistance: Min guard;+2 safety/equipment Gait Distance (Feet): 200 Feet Assistive device: Rolling walker (2 wheeled) Gait Pattern/deviations: Step-through pattern;Trunk flexed Gait velocity:  slightly decreased   General Gait Details: Patient ambulated with heavy leaning on RW for support. Pain in left side with mobility ( due to fall here?) Min guard +2 for safety.  Stairs            Wheelchair Mobility    Modified Rankin (Stroke Patients Only)       Balance Overall balance assessment: Needs assistance Sitting-balance support: Feet supported Sitting balance-Leahy Scale: Good     Standing balance support: Bilateral upper extremity supported;During functional activity Standing balance-Leahy Scale: Fair Standing balance comment: heavy use of B UEs with ambulation using RW. Min guard +2 for safety                             Pertinent Vitals/Pain Pain Assessment: Faces Faces Pain Scale: Hurts even more Pain Location: L side Pain Descriptors / Indicators: Discomfort;Grimacing;Guarding;Sore Pain Intervention(s): Monitored during session    Home Living Family/patient expects to be discharged to:: Unsure                 Additional Comments: patient states he and wife were living with son. Tells me he is unsure if they are returning there when discharged. He says his wife is here at [facility] also.    Prior Function           Comments: Per prior admission, patient was using cane. Came in without AD. Needs walker     Hand Dominance        Extremity/Trunk Assessment   Upper Extremity Assessment Upper Extremity Assessment: Generalized weakness  Lower Extremity Assessment Lower Extremity Assessment: Generalized weakness    Cervical / Trunk Assessment Cervical / Trunk Assessment: Normal  Communication   Communication: No difficulties  Cognition Arousal/Alertness: Awake/alert Behavior During Therapy: Flat affect Overall Cognitive Status: No family/caregiver present to determine baseline cognitive functioning                                 General Comments: Patient initially not very verbal. Became more talkative as  session progressed.      General Comments      Exercises     Assessment/Plan    PT Assessment Patient needs continued PT services  PT Problem List Decreased strength;Decreased mobility;Decreased activity tolerance;Decreased balance;Pain;Decreased knowledge of use of DME       PT Treatment Interventions DME instruction;Therapeutic exercise;Gait training;Balance training;Functional mobility training;Therapeutic activities;Patient/family education;Stair training    PT Goals (Current goals can be found in the Care Plan section)  Acute Rehab PT Goals Patient Stated Goal: none stated PT Goal Formulation: Patient unable to participate in goal setting Time For Goal Achievement: 09/17/20    Frequency Min 2X/week   Barriers to discharge Inaccessible home environment;Decreased caregiver support Unsure what discharge plan is    Co-evaluation               AM-PAC PT "6 Clicks" Mobility  Outcome Measure Help needed turning from your back to your side while in a flat bed without using bedrails?: A Little Help needed moving from lying on your back to sitting on the side of a flat bed without using bedrails?: A Little Help needed moving to and from a bed to a chair (including a wheelchair)?: A Little Help needed standing up from a chair using your arms (e.g., wheelchair or bedside chair)?: A Lot Help needed to walk in hospital room?: A Little Help needed climbing 3-5 steps with a railing? : A Lot 6 Click Score: 16    End of Session Equipment Utilized During Treatment: Gait belt Activity Tolerance: Patient tolerated treatment well Patient left: in chair Nurse Communication: Mobility status PT Visit Diagnosis: Other abnormalities of gait and mobility (R26.89);Muscle weakness (generalized) (M62.81);Repeated falls (R29.6);Unsteadiness on feet (R26.81)    Time: 1130-1145 PT Time Calculation (min) (ACUTE ONLY): 15 min   Charges:   PT Evaluation $PT Eval Moderate Complexity: 1  Mod          Cecile Gillispie, PT, GCS 09/04/20,12:00 PM

## 2020-09-04 NOTE — ED Notes (Signed)
Refused breakfast.

## 2020-09-04 NOTE — ED Notes (Signed)
Hourly rounding performed, patient currently asleep in room. Patient has no complaints at this time. Q15 minute rounds and monitoring via Rover and Officer to continue. 

## 2020-09-04 NOTE — ED Notes (Signed)
Pt dressed out at this time in room. belongings collected in bag 1 of 1 and placed in secure area of ED. Belongings include:  Scarlette Ar top [employer] White socks [employer] that he left in pants and placed in bag

## 2020-09-04 NOTE — ED Notes (Signed)
Ambulated independently with EMT-P at side due to instability on feet. Now sitting in recliner.

## 2020-09-04 NOTE — ED Provider Notes (Addendum)
-----------------------------------------   1:49 PM on 09/04/2020 -----------------------------------------  At shift change, I received signout that this patient was pending social work evaluation for possible SNF placement.  However, looking back at the notes from this week it appears that the patient was initially evaluated by psychiatry and the plan was for inpatient admission (although he was voluntary at that time).  The patient was then discharged to [facility], who then sent him back to the ED for repeat evaluation.  He was seen by the ED provider last night and medically cleared.  At this time, the patient has been upset for several hours that he cannot see his wife, who is also a patient.  Per his wife's family members, we are not to let him see her because they feel that he has contributed to her current state.  It is also not clear that they are legally married.  I assured the patient that his wife was safe, but explained that due to privacy law, I cannot let him see her or give any information about her without her consent.  Now, the patient has demanded to be discharged.  Initially he was reluctant to tell us what his plan was or where he was going to go.  I told the patient that he was not under commitment, but we wanted to make sure that there was a safe discharge plan.  His son-in-law is now here and I spoke to him; he confirmed that he would be happy to take the patient upon discharge and that the patient would be safe.  I also contacted Dr. Toni Amend from psychiatry.  He confirmed that the patient was voluntary.  He advised that the psychiatry NP had plan to reevaluate the patient today, however there did not appear to be any indication for inpatient admission.  I confirmed with the patient himself that he has no SI or HI.  He is now able to contract for safety.  He is alert and oriented and demonstrates appropriate decision-making capacity.  He is ambulating with steady gait and  without any apparent difficulty.  At this time, he does not demonstrate acute danger to self or others.  He has a safe discharge plan in place with the son-in-law.  Therefore, I have proceeded with discharge per his request.  I gave him thorough return precautions and he expressed understanding.   Dionne Bucy, MD 09/04/20 1356    Dionne Bucy, MD 09/04/20 1358

## 2020-09-04 NOTE — ED Provider Notes (Signed)
Emergency Medicine Observation Re-evaluation Note  DEQUAVION FOLLETTE is a 65 y.o. male, seen on rounds today.  Pt initially presented to the ED for complaints of No chief complaint on file. Currently, the patient is resting comfortably.  Physical Exam  BP (!) 147/76 (BP Location: Right Arm)   Pulse 80   Temp 98.7 F (37.1 C) (Oral)   Resp 20   SpO2 98%  Physical Exam Gen: No acute distress  Resp: Normal rise and fall of chest Neuro: Moving all four extremities Psych: Resting currently, calm and cooperative when awake    ED Course / MDM  EKG:   I have reviewed the labs performed to date as well as medications administered while in observation.  Recent changes in the last 24 hours include no acute events overnight.  Plan  Current plan is for social work evaluation for placement. Patient is not under full IVC at this time.   Destin Vinsant, Layla Maw, DO 09/04/20 [phone]

## 2020-09-04 NOTE — ED Notes (Signed)
Offered wheelchair to lobby by EMTP, refused and walked out with son-in law.

## 2020-09-04 NOTE — ED Notes (Addendum)
Pt inquiring about his wife who is here as a patient. Updated pt that wife was stable and being treated. Pt requesting to see wife who is a patient; informed that due to visitor restrictions of ED area wife is in, that he is unable to do so at this time. Pt states that this RN is deceiving him. Offered for charge RN to come reiterate information provided to patient.

## 2020-09-04 NOTE — ED Notes (Signed)
Pt given lunch

## 2020-09-04 NOTE — Discharge Instructions (Addendum)
Continue taking all of your prescribed medications including antibiotics that were prescribed to you during your previous visit a few days ago.  Return to the ER for any new or worsening abdominal pain, fever, weakness, confusion, or any thoughts of wanting to hurt yourself or anyone else.

## 2020-09-04 NOTE — ED Triage Notes (Addendum)
Pt repots he wants to give up on life. Pt admits to having SI with no plan. Pt denies HI. Pt sts, "My mind just aint right." Pt is calm, quiet and cooperative in triage. Per pt, he was seen earlier today for his nerves and a small mental breakdown. Pt reports he brought his wife into ED and we wont let him see her because his step-daughter lied on him.

## 2020-09-04 NOTE — ED Notes (Addendum)
Becomes verbally aggressive and making threats to staff about leaving. Cursing at EDP. EDP agreeable to discharge patient to [facility], so pt is a safe discharge. Pt aggressive towards EDP, EMTP and [employer] providing security in quad area.

## 2020-09-04 NOTE — ED Notes (Signed)
Patient transferred from Triage to room 23 after dressing out and screening for contraband. Report received from [place], [place] including situation, background, assessment and recommendations. Pt oriented to [employer] including Q15 minute rounds as well as Psychologist, counselling for their protection. Patient is alert and oriented, warm and dry in no acute distress. Patient denies SI, HI, and AVH. Pt. Encouraged to let this nurse know if needs arise.

## 2020-09-04 NOTE — ED Notes (Signed)
Given all of his clothing. Changing in bathroom. Son in Social worker finishing visit with mother and will take pt home. Refusing to take discharge paperwork.

## 2020-09-04 NOTE — ED Notes (Signed)
Physical therapy at bedside

## 2020-09-04 NOTE — ED Notes (Signed)
Hourly rounding performed, patient currently awake in room. Patient has no complaints at this time. Q15 minute rounds and monitoring via Rover and Officer to continue. 

## 2020-09-04 NOTE — ED Notes (Signed)
Pt sleeping in hallway bed. RR even and unlabored.

## 2020-09-04 NOTE — ED Notes (Signed)
IVC pending consult   

## 2020-09-04 NOTE — ED Notes (Signed)
Pt reports he lied about SI so he could come in here and see his wife, also states he has hip pain from fall few days ago. Pt later states to this nurse that not seeing his wife will make a man want to kill himself. This was reported to MD. Pt very unsteady on feet, stumbling around and slow to stand.

## 2020-09-04 NOTE — ED Notes (Signed)
Charge RN informed reiterated that pt wife is safe and being treated that he may not be able to visit the patient at this time. Pt yelling "I don't give a damn who you are" and cursing at staff. requesting phone, given phone by EMTP

## 2020-09-05 DIAGNOSIS — F4329 Adjustment disorder with other symptoms: Secondary | ICD-10-CM | POA: Diagnosis not present

## 2020-09-05 DIAGNOSIS — F1410 Cocaine abuse, uncomplicated: Secondary | ICD-10-CM | POA: Diagnosis present

## 2020-09-05 DIAGNOSIS — F332 Major depressive disorder, recurrent severe without psychotic features: Secondary | ICD-10-CM

## 2020-09-05 DIAGNOSIS — R45851 Suicidal ideations: Secondary | ICD-10-CM

## 2020-09-05 LAB — ACETAMINOPHEN LEVEL: Acetaminophen (Tylenol), Serum: <10 ug/mL — ABNORMAL LOW (ref 10–30)

## 2020-09-05 LAB — COMPREHENSIVE METABOLIC PANEL
ALT: 20 U/L (ref 0–44)
AST: 26 U/L (ref 15–41)
Albumin: 3.3 g/dL — ABNORMAL LOW (ref 3.5–5.0)
Alkaline Phosphatase: 54 U/L (ref 38–126)
Anion gap: 10 (ref 5–15)
BUN: 52 mg/dL — ABNORMAL HIGH (ref 8–23)
CO2: 24 mmol/L (ref 22–32)
Calcium: 8.9 mg/dL (ref 8.9–10.3)
Chloride: 102 mmol/L (ref 98–111)
Creatinine, Ser: 2.8 mg/dL — ABNORMAL HIGH (ref 0.61–1.24)
GFR, Estimated: 24 mL/min — ABNORMAL LOW (ref >60)
Glucose, Bld: 60 mg/dL — ABNORMAL LOW (ref 70–99)
Potassium: 3.7 mmol/L (ref 3.5–5.1)
Sodium: 136 mmol/L (ref 135–145)
Total Bilirubin: 1 mg/dL (ref 0.3–1.2)
Total Protein: 6.9 g/dL (ref 6.5–8.1)

## 2020-09-05 LAB — CBC
HCT: 36.9 % — ABNORMAL LOW (ref 39.0–52.0)
Hemoglobin: 12.8 g/dL — ABNORMAL LOW (ref 13.0–17.0)
MCH: 30.5 pg (ref 26.0–34.0)
MCHC: 34.7 g/dL (ref 30.0–36.0)
MCV: 88.1 fL (ref 80.0–100.0)
Platelets: 250 10*3/uL (ref 150–400)
RBC: 4.19 MIL/uL — ABNORMAL LOW (ref 4.22–5.81)
RDW: 12.6 % (ref 11.5–15.5)
WBC: 8 10*3/uL (ref 4.0–10.5)
nRBC: 0 % (ref 0.0–0.2)

## 2020-09-05 LAB — URINE DRUG SCREEN, QUALITATIVE (ARMC ONLY)
Amphetamines, Ur Screen: NOT DETECTED
Barbiturates, Ur Screen: NOT DETECTED
Benzodiazepine, Ur Scrn: NOT DETECTED
Cannabinoid 50 Ng, Ur ~~LOC~~: NOT DETECTED
Cocaine Metabolite,Ur ~~LOC~~: POSITIVE — AB
MDMA (Ecstasy)Ur Screen: NOT DETECTED
Methadone Scn, Ur: NOT DETECTED
Opiate, Ur Screen: NOT DETECTED
Phencyclidine (PCP) Ur S: NOT DETECTED
Tricyclic, Ur Screen: NOT DETECTED

## 2020-09-05 LAB — ETHANOL: Alcohol, Ethyl (B): <10 mg/dL (ref <10)

## 2020-09-05 LAB — SALICYLATE LEVEL: Salicylate Lvl: <7 mg/dL — ABNORMAL LOW (ref 7.0–30.0)

## 2020-09-05 MED ORDER — CITALOPRAM HYDROBROMIDE 20 MG PO TABS
20.0000 mg | ORAL_TABLET | Freq: Every day | ORAL | 0 refills | Status: DC
Start: 2020-09-05 — End: 2020-10-23

## 2020-09-05 MED ORDER — METFORMIN HCL 500 MG PO TABS
1000.0000 mg | ORAL_TABLET | Freq: Two times a day (BID) | ORAL | Status: DC
  Administered 2020-09-05: 1000 mg via ORAL
  Filled 2020-09-05: qty 2

## 2020-09-05 MED ORDER — MIRTAZAPINE 15 MG PO TABS: 15.0000 mg | ORAL_TABLET | Freq: Every day | ORAL | Status: DC

## 2020-09-05 MED ORDER — LOSARTAN POTASSIUM 50 MG PO TABS
100.0000 mg | ORAL_TABLET | Freq: Every day | ORAL | Status: DC
  Administered 2020-09-05: 100 mg via ORAL
  Filled 2020-09-05: qty 2

## 2020-09-05 MED ORDER — METFORMIN HCL 1000 MG PO TABS: 1000.0000 mg | ORAL_TABLET | Freq: Two times a day (BID) | ORAL | 0 refills | Status: DC

## 2020-09-05 MED ORDER — METRONIDAZOLE 500 MG PO TABS: 500.0000 mg | ORAL_TABLET | Freq: Three times a day (TID) | ORAL | 0 refills | Status: AC

## 2020-09-05 MED ORDER — AMOXICILLIN-POT CLAVULANATE 875-125 MG PO TABS: 1.0000 | ORAL_TABLET | Freq: Two times a day (BID) | ORAL | 0 refills | Status: AC

## 2020-09-05 MED ORDER — LEVOTHYROXINE SODIUM 50 MCG PO TABS: 50.0000 ug | ORAL_TABLET | Freq: Every day | ORAL | 0 refills | Status: DC

## 2020-09-05 MED ORDER — LOSARTAN POTASSIUM 100 MG PO TABS: 100.0000 mg | ORAL_TABLET | Freq: Every day | ORAL | 0 refills | Status: DC

## 2020-09-05 MED ORDER — GABAPENTIN 300 MG PO CAPS: 900.0000 mg | ORAL_CAPSULE | Freq: Two times a day (BID) | ORAL | 0 refills | Status: DC

## 2020-09-05 MED ORDER — ATORVASTATIN CALCIUM 40 MG PO TABS: 40.0000 mg | ORAL_TABLET | Freq: Every evening | ORAL | 0 refills | Status: DC

## 2020-09-05 MED ORDER — GABAPENTIN 300 MG PO CAPS
900.0000 mg | ORAL_CAPSULE | Freq: Two times a day (BID) | ORAL | Status: DC
  Administered 2020-09-05: 900 mg via ORAL
  Filled 2020-09-05: qty 3

## 2020-09-05 MED ORDER — MIRTAZAPINE 15 MG PO TABS: 7.5000 mg | ORAL_TABLET | Freq: Every day | ORAL | 0 refills | Status: DC

## 2020-09-05 MED ORDER — AMOXICILLIN-POT CLAVULANATE 875-125 MG PO TABS
1.0000 | ORAL_TABLET | Freq: Two times a day (BID) | ORAL | Status: DC
  Administered 2020-09-05: 1 via ORAL
  Filled 2020-09-05: qty 1

## 2020-09-05 MED ORDER — METRONIDAZOLE 500 MG PO TABS
500.0000 mg | ORAL_TABLET | Freq: Three times a day (TID) | ORAL | Status: DC
  Administered 2020-09-05 (×2): 500 mg via ORAL
  Filled 2020-09-05 (×2): qty 1

## 2020-09-05 MED ORDER — AMLODIPINE BESYLATE 5 MG PO TABS
10.0000 mg | ORAL_TABLET | Freq: Every day | ORAL | Status: DC
  Administered 2020-09-05: 10 mg via ORAL
  Filled 2020-09-05: qty 2

## 2020-09-05 MED ORDER — VICTOZA 18 MG/3ML ~~LOC~~ SOPN: 1.2000 mg | PEN_INJECTOR | Freq: Every morning | SUBCUTANEOUS | 0 refills | Status: DC

## 2020-09-05 MED ORDER — HYDROCHLOROTHIAZIDE 25 MG PO TABS
25.0000 mg | ORAL_TABLET | Freq: Every day | ORAL | Status: DC
  Administered 2020-09-05: 25 mg via ORAL
  Filled 2020-09-05: qty 1

## 2020-09-05 MED ORDER — ATORVASTATIN CALCIUM 20 MG PO TABS: 40.0000 mg | ORAL_TABLET | Freq: Every day | ORAL | Status: DC

## 2020-09-05 MED ORDER — OXYCODONE-ACETAMINOPHEN 7.5-325 MG PO TABS
1.0000 | ORAL_TABLET | Freq: Two times a day (BID) | ORAL | Status: DC | PRN
  Administered 2020-09-05: 1 via ORAL
  Filled 2020-09-05: qty 1

## 2020-09-05 MED ORDER — LEVOTHYROXINE SODIUM 50 MCG PO TABS
50.0000 ug | ORAL_TABLET | Freq: Every day | ORAL | Status: DC
  Administered 2020-09-05: 50 ug via ORAL
  Filled 2020-09-05: qty 1

## 2020-09-05 MED ORDER — CITALOPRAM HYDROBROMIDE 20 MG PO TABS
20.0000 mg | ORAL_TABLET | Freq: Every day | ORAL | Status: DC
  Administered 2020-09-05: 20 mg via ORAL
  Filled 2020-09-05: qty 1

## 2020-09-05 MED ORDER — AMLODIPINE BESYLATE 10 MG PO TABS: 10.0000 mg | ORAL_TABLET | Freq: Every day | ORAL | 0 refills | Status: DC

## 2020-09-05 NOTE — ED Notes (Signed)
TTS and Jamie at bedside

## 2020-09-05 NOTE — ED Notes (Signed)
Hourly rounding performed, patient currently asleep in room. Patient has no complaints at this time. Q15 minute rounds and monitoring via Rover and Officer to continue. 

## 2020-09-05 NOTE — Consult Note (Signed)
[facility] Face-to-Face Psychiatry Consult   Reason for Consult:  psych evaluation  Referring Physician:  Dr. Roxan Hockeyobinson [facility] MRN:  [id number] Principal Diagnosis: Suicidal ideations Diagnosis:  Principal Problem:   Suicidal ideations Active Problems:   MDD (major depressive disorder), recurrent episode, severe (HCC)   Cocaine abuse (HCC)   Total Time spent with patient: 1 hour  Subjective:   Steve Alvarado is a 65 y.o. male patient admitted to [facility]  Per triage nurse, pt reports he lied about SI so he could come in here and see his wife, also states he has hip pain from fall few days ago. Pt later states to this nurse that not seeing his wife will make a man want to kill himself. This was reported to MD. Pt very unsteady on feet, stumbling around and slow to stand.  HPI:   Steve Alvarado, 65 y.o., male patient [facility].  Patient seen by TTS and this provider; chart reviewed and consulted with Dr. Roxan Hockeyobinson on 09/05/20.  On evaluation Steve Alvarado reports that he returned back to the hospital because he was upset that he isn't allowed to see his wife. He states that his step daughter states that  he is the reason his wife is in the hospital and advised the hospital not to let him visit.  He has fleeting SI. He is currently homeless which can also attribute to his depression and may have feelings of guilt because he drugs use is what lead to both he and wife being evicted. Prior to his wives hospitalization, they were both staying with his step son. But says he cannot return there if his wife isnt there.   During evaluation Steve Alvarado is laying in the bed.  He is alert/oriented x 4; calm/ depressed/cooperative; and mood congruent with affect.  Patient is speaking in a clear tone at moderate volume, and normal pace; with fair eye contact.  His thought process is coherent and relevant; There is no indication that he is currently responding to  internal/external stimuli or experiencing delusional thought content.  Patient endorses suicidal/self-harm/ideation and denies HI. There is no evidence of psychosis, or and paranoia.    Recommendations:  Patient is a high risk for Suicide and therefore Inpatient psychiatric hospitalization is recommended.    Past Psychiatric History:   Risk to Self:   Risk to Others:   Prior Inpatient Therapy:   Prior Outpatient Therapy:    Past Medical History:  Past Medical History:  Diagnosis Date   Coronary artery disease    Diabetes mellitus    Type II   Hyperlipidemia    Hypertension    Thyroid disease     Past Surgical History:  Procedure Laterality Date   CARDIAC CATHETERIZATION  08/06/2010   Bare metal stent placed in RCA.   HAND SURGERY     left   Family History:  Family History  Problem Relation Age of Onset   Heart attack Mother    Family Psychiatric  History: unknown Social History:  Social History   Substance and Sexual Activity  Alcohol Use No     Social History   Substance and Sexual Activity  Drug Use Not Currently    Social History   Socioeconomic History   Marital status: Married    Spouse name: Not on file   Number of children: Not on file   Years of education: Not on file   Highest education level: Not on file  Occupational History   Not on file  Tobacco Use   Smoking status: Never   Smokeless tobacco: Never  Substance and Sexual Activity   Alcohol use: No   Drug use: Not Currently   Sexual activity: Not on file  Other Topics Concern   Not on file  Social History Narrative   Not on file   Social Determinants of Health   Financial Resource Strain: Not on file  Food Insecurity: Not on file  Transportation Needs: Not on file  Physical Activity: Not on file  Stress: Not on file  Social Connections: Not on file   Additional Social History:    Allergies:   Allergies  Allergen Reactions   Ace Inhibitors Cough    cough    Labs:   Results for orders placed or performed during the hospital encounter of 09/02/20 (from the past 48 hour(s))  CBG monitoring, ED     Status: Abnormal   Collection Time: 09/03/20  5:14 AM  Result Value Ref Range   Glucose-Capillary 127 (H) 70 - 99 mg/dL    Comment: Glucose reference range applies only to samples taken after fasting for at least 8 hours.  CBC with Differential     Status: None   Collection Time: 09/03/20  5:31 AM  Result Value Ref Range   WBC 9.1 4.0 - 10.5 K/uL   RBC 4.62 4.22 - 5.81 MIL/uL   Hemoglobin 14.1 13.0 - 17.0 g/dL   HCT 74.2 59.5 - 63.8 %   MCV 87.4 80.0 - 100.0 fL   MCH 30.5 26.0 - 34.0 pg   MCHC 34.9 30.0 - 36.0 g/dL   RDW 75.6 43.3 - 29.5 %   Platelets 304 150 - 400 K/uL   nRBC 0.0 0.0 - 0.2 %   Neutrophils Relative % 47 %   Neutro Abs 4.2 1.7 - 7.7 K/uL   Lymphocytes Relative 39 %   Lymphs Abs 3.6 0.7 - 4.0 K/uL   Monocytes Relative 9 %   Monocytes Absolute 0.8 0.1 - 1.0 K/uL   Eosinophils Relative 5 %   Eosinophils Absolute 0.5 0.0 - 0.5 K/uL   Basophils Relative 0 %   Basophils Absolute 0.0 0.0 - 0.1 K/uL   Immature Granulocytes 0 %   Abs Immature Granulocytes 0.03 0.00 - 0.07 K/uL    Comment: Performed at [facility], [address] [place], [place] [address]  Basic metabolic panel     Status: Abnormal   Collection Time: 09/03/20  5:31 AM  Result Value Ref Range   Sodium 137 135 - 145 mmol/L   Potassium 4.2 3.5 - 5.1 mmol/L   Chloride 104 98 - 111 mmol/L   CO2 23 22 - 32 mmol/L   Glucose, Bld 150 (H) 70 - 99 mg/dL    Comment: Glucose reference range applies only to samples taken after fasting for at least 8 hours.   BUN 33 (H) 8 - 23 mg/dL   Creatinine, Ser 6.60 (H) 0.61 - 1.24 mg/dL   Calcium 9.3 8.9 - 63.0 mg/dL   GFR, Estimated 39 (L) >60 mL/min    Comment: (NOTE) Calculated using the CKD-EPI Creatinine Equation (2021)    Anion gap 10 5 - 15    Comment: Performed at [facility], [address]  [place], [place] [address]  Resp Panel by RT-PCR (Flu A&B, Covid) Nasopharyngeal Swab     Status: None   Collection Time: 09/03/20  5:31 AM   Specimen: Nasopharyngeal Swab; Nasopharyngeal(NP)  swabs in vial transport medium  Result Value Ref Range   SARS Coronavirus 2 by RT PCR NEGATIVE NEGATIVE    Comment: (NOTE) SARS-CoV-2 target nucleic acids are NOT DETECTED.  The SARS-CoV-2 RNA is generally detectable in upper respiratory specimens during the acute phase of infection. The lowest concentration of SARS-CoV-2 viral copies this assay can detect is 138 copies/mL. A negative result does not preclude SARS-Cov-2 infection and should not be used as the sole basis for treatment or other patient management decisions. A negative result may occur with  improper specimen collection/handling, submission of specimen other than nasopharyngeal swab, presence of viral mutation(s) within the areas targeted by this assay, and inadequate number of viral copies(<138 copies/mL). A negative result must be combined with clinical observations, patient history, and epidemiological information. The expected result is Negative.  Fact Sheet for Patients:  BloggerCourse.com  Fact Sheet for Healthcare Providers:  SeriousBroker.it  This test is no t yet approved or cleared by the [place] FDA and  has been authorized for detection and/or diagnosis of SARS-CoV-2 by FDA under an Emergency Use Authorization (EUA). This EUA will remain  in effect (meaning this test can be used) for the duration of the COVID-19 declaration under Section 564(b)(1) of the Act, 21 U.S.C.section 360bbb-3(b)(1), unless the authorization is terminated  or revoked sooner.       Influenza A by PCR NEGATIVE NEGATIVE   Influenza B by PCR NEGATIVE NEGATIVE    Comment: (NOTE) The Xpert Xpress SARS-CoV-2/FLU/RSV plus assay is intended as an aid in the diagnosis of influenza from  Nasopharyngeal swab specimens and should not be used as a sole basis for treatment. Nasal washings and aspirates are unacceptable for Xpert Xpress SARS-CoV-2/FLU/RSV testing.  Fact Sheet for Patients: BloggerCourse.com  Fact Sheet for Healthcare Providers: SeriousBroker.it  This test is not yet approved or cleared by the [place] FDA and has been authorized for detection and/or diagnosis of SARS-CoV-2 by FDA under an Emergency Use Authorization (EUA). This EUA will remain in effect (meaning this test can be used) for the duration of the COVID-19 declaration under Section 564(b)(1) of the Act, 21 U.S.C. section 360bbb-3(b)(1), unless the authorization is terminated or revoked.  Performed at [facility], [address] [place], [place] [address]   Magnesium     Status: None   Collection Time: 09/03/20  5:31 AM  Result Value Ref Range   Magnesium 2.2 1.7 - 2.4 mg/dL    Comment: Performed at [facility], [address] [place], [place] [address]  Troponin I (High Sensitivity)     Status: Abnormal   Collection Time: 09/03/20  5:31 AM  Result Value Ref Range   Troponin I (High Sensitivity) 33 (H) <18 ng/L    Comment: (NOTE) Elevated high sensitivity troponin I (hsTnI) values and significant  changes across serial measurements may suggest ACS but many other  chronic and acute conditions are known to elevate hsTnI results.  Refer to the "Links" section for chest pain algorithms and additional  guidance. Performed at [facility], [address] [place], [place] [address]   CBG monitoring, ED     Status: Abnormal   Collection Time: 09/03/20  7:37 AM  Result Value Ref Range   Glucose-Capillary 124 (H) 70 - 99 mg/dL    Comment: Glucose reference range applies only to samples taken after fasting for at least 8 hours.    No current facility-administered medications for this encounter.    Current Outpatient Medications  Medication Sig Dispense Refill   amLODipine (NORVASC) 10 MG tablet Take 10 mg by mouth daily.     amoxicillin-clavulanate (AUGMENTIN) 875-125 MG tablet Take 1 tablet by mouth 2 (two) times daily for 10 days. 20 tablet 0   atorvastatin (LIPITOR) 40 MG tablet Take 1 tablet (40 mg total) by mouth every evening. 30 tablet 0   citalopram (CELEXA) 20 MG tablet Take 1 tablet (20 mg total) by mouth daily. 30 tablet 0   gabapentin (NEURONTIN) 300 MG capsule Take 900 mg by mouth 2 (two) times daily.     hydrochlorothiazide (HYDRODIURIL) 25 MG tablet Take 1 tablet (25 mg total) by mouth daily. 30 tablet 0   insulin glargine (LANTUS) 100 unit/mL SOPN Inject 60 Units into the skin 2 (two) times daily.     levothyroxine (SYNTHROID) 50 MCG tablet Take 50 mcg by mouth daily.     liraglutide (VICTOZA) 18 MG/3ML SOPN Inject 1.2 mg into the skin in the morning.     losartan (COZAAR) 100 MG tablet Take 100 mg by mouth daily.     metFORMIN (GLUCOPHAGE) 1000 MG tablet Take 1,000 mg by mouth 2 (two) times daily.     metroNIDAZOLE (FLAGYL) 500 MG tablet Take 1 tablet (500 mg total) by mouth 3 (three) times daily for 10 days. 30 tablet 0   mirtazapine (REMERON) 15 MG tablet Take 7.5 mg by mouth daily.     oxyCODONE-acetaminophen (PERCOCET) 7.5-325 MG tablet Take 1 tablet by mouth 2 (two) times daily as needed.      Musculoskeletal: Strength & Muscle Tone: within normal limits Gait & Station: unsteady Patient leans: N/A  Psychiatric Specialty Exam:  Presentation  General Appearance: Appropriate for Environment  Eye Contact:Fair  Speech:Clear and Coherent  Speech Volume:Decreased  Handedness:Right   Mood and Affect  Mood:Anxious; Depressed  Affect:Depressed; Labile   Thought Process  Thought Processes:Coherent  Descriptions of Associations:Intact  Orientation:Full (Time, Place and Person)  Thought Content:Scattered  History of Schizophrenia/Schizoaffective  disorder:No  Duration of Psychotic Symptoms:N/A  Hallucinations:No data recorded Ideas of Reference:None  Suicidal Thoughts:Suicidal Thoughts: Yes, Passive SI Passive Intent and/or Plan: Without Intent  Homicidal Thoughts:Homicidal Thoughts: No   Sensorium  Memory:Immediate Poor; Recent Poor  Judgment:Poor  Insight:Poor   Executive Functions  Concentration:Poor  Attention Span:Poor  Recall:Poor  Fund of Knowledge:Poor  Language:Poor   Psychomotor Activity  Psychomotor Activity:Psychomotor Activity: Normal   Assets  Assets:Leisure Time   Sleep  Sleep:Sleep: Fair   Physical Exam: Physical Exam Vitals and nursing note reviewed.  Constitutional:      Appearance: Normal appearance.  HENT:     Head: Normocephalic and atraumatic.     Nose: Nose normal.     Mouth/Throat:     Mouth: Mucous membranes are moist.  Eyes:     Pupils: Pupils are equal, round, and reactive to light.  Pulmonary:     Effort: Pulmonary effort is normal.  Musculoskeletal:        General: Normal range of motion.     Cervical back: Normal range of motion.  Skin:    General: Skin is dry.  Neurological:     General: No focal deficit present.     Mental Status: He is alert and oriented to person, place, and time.  Psychiatric:        Attention and Perception: Attention and perception normal.        Mood and Affect: Mood is anxious and depressed. Affect is angry and tearful.  Speech: Speech normal.        Behavior: Behavior is slowed and withdrawn. Behavior is cooperative.        Thought Content: Thought content includes suicidal ideation.        Cognition and Memory: Cognition and memory normal.        Judgment: Judgment is inappropriate.   Review of Systems  Psychiatric/Behavioral:  Positive for depression, substance abuse and suicidal ideas. Negative for hallucinations and memory loss.   All other systems reviewed and are negative. Blood pressure (!) 146/75, pulse 74,  temperature 97.9 F (36.6 C), temperature source Oral, resp. rate 17, SpO2 99 %. There is no height or weight on file to calculate BMI.  Treatment Plan Summary: Daily contact with patient to assess and evaluate symptoms and progress in treatment and Medication management  Disposition: Recommend psychiatric Inpatient admission when medically cleared. Supportive therapy provided about ongoing stressors. Discussed crisis plan, support from social network, calling 911, coming to the Emergency Department, and calling Suicide Hotline.  Jearld Lesch, NP 09/05/2020 4:17 AM

## 2020-09-05 NOTE — ED Notes (Signed)
Pt requested cane. Provider Katrinka Blazing notified in person. Provider Katrinka Blazing to bedside to give pt option to obtain cane once d/c or to place order for walker now as hospital doesn't have option for order for cane.

## 2020-09-05 NOTE — Discharge Instructions (Signed)
[facility]  Mental health service in [place], [place]  Address: [address], [place], [place] [address] Hours:  Closed ? Opens 8?AM Mon Phone: [phone] 

## 2020-09-05 NOTE — ED Notes (Signed)
Pt requested pain med. Notified cannot have PRN percocet again so soon as had it at been given this morning around 10:40.

## 2020-09-05 NOTE — ED Notes (Signed)
tts contacts this nurse and states that blood work and uds is needed for pt placement, states that this can wait for when pt wakes up

## 2020-09-05 NOTE — ED Provider Notes (Signed)
Emergency Medicine Observation Re-evaluation Note  YORK VALLIANT is a 65 y.o. male, seen on rounds today.  Pt initially presented to the ED for complaints of Mental Health Problem Currently, the patient is resting comfortably.  Physical Exam  BP (!) 146/75 (BP Location: Left Arm)   Pulse 74   Temp 97.9 F (36.6 C) (Oral)   Resp 17   SpO2 99%  Physical Exam General: No acute distress Cardiac: Well-perfused extremities Lungs: No respiratory distress Psych: Appropriate mood and affect  ED Course / MDM  EKG:   I have reviewed the labs performed to date as well as medications administered while in observation.  Recent changes in the last 24 hours include none.  Plan  Current plan is for psychiatric placement. Patient is under full IVC at this time.   Merwyn Katos, MD 09/05/20 0500

## 2020-09-05 NOTE — ED Notes (Signed)
Steve Alvarado states checked with EMS who state they don't have record of home addresses after 24 hours.

## 2020-09-05 NOTE — ED Notes (Signed)
Chief Technology Officer was able to find out pt's aunt's address. [address] [place]. Called [employer] for cab voucher who states they'll be by to pick pt up for d/c by 1540. Pt notified. Pt given facial clothe as requested.

## 2020-09-05 NOTE — ED Notes (Signed)
Pt moved from 23 to [facility] at this time, pt wakes up minimally from sleep enough to say okay when informed of move and returns to sleep.

## 2020-09-05 NOTE — ED Notes (Signed)
Called sister Roanna Raider who is unsure of phone number or address of aunt who lives in [place]. Family verbal that they don't want [place] to contact his ex-wife who is the only other person listed in chart to contact. Charge nurse Marylene Land notified. Verbal to send pt to homeless shelter with cab voucher but Kathrynn Running with counseling stated shelter will immediately send him back to hospital if he tells them he has place to go but unsure of how to get there.

## 2020-09-05 NOTE — ED Notes (Signed)
EDP Siadecki verbal that he'll review and send all of pt's home med prescriptions to [facility] on [address] as pt requested.

## 2020-09-05 NOTE — ED Notes (Signed)
Pt refused to have vital signs checked again as was in a rush to leave. Cab at front of ER. Pt was prompted to sign for d/c but forgot to sign while this RN was in room with another pt.

## 2020-09-05 NOTE — ED Notes (Addendum)
Pt states part of address is "[employer]" but he isn't sure of rest of address.

## 2020-09-05 NOTE — BH Assessment (Signed)
Comprehensive Clinical Assessment (CCA) Note  09/05/2020 Steve Alvarado [id number]  Chief Complaint: Patient is a 65 year old male presenting to [facility] ED initially voluntary but has since been IVC'd. Per triage note Pt repots he wants to give up on life. Pt admits to having SI with no plan. Pt denies HI. Pt sts, "My mind just aint right." Pt is calm, quiet and cooperative in triage. Per pt, he was seen earlier today for his nerves and a small mental breakdown. Pt reports he brought his wife into ED and we wont let him see her because his step-daughter lied on him. During assessment patient appears alert and oriented x4, cooperative, mood appears depressed. Patient reports "there is so much going on, I want to see my wife, I brought her up here to get her help medically and my stepdaughter came up here and lied on me so now they won't let me see her, we been together 28 years." Patient reports that he was having SI earlier today "I did want to hurt myself but not anymore." Patient is unable to contract for safety if discharged though. Patient reports that he recently lost his apartment "3 weeks ago" and has been staying with his step son. Patient reports poor sleep and lack of appetite. Patient also reports a recent relapse on Crack Cocaine "I been clean for a while but I relapsed when all this stuff happened." Patient currently denies SI/HI/AH/VH and does not appear to be responding to any internal or external stimuli.  Per Psyc NP Lerry Linerashaun Dixon patient is recommended for Inpatient Hospitalization  Chief Complaint  Patient presents with   Mental Health Problem   Visit Diagnosis: Major Depressive Disorder, severe   CCA Screening, Triage and Referral (STR)  Patient Reported Information How did you hear about us? Self  Referral name: No data recorded Referral phone number: No data recorded  Whom do you see for routine medical problems? No data recorded Practice/Facility Name: No data  recorded Practice/Facility Phone Number: No data recorded Name of Contact: No data recorded Contact Number: No data recorded Contact Fax Number: No data recorded Prescriber Name: No data recorded Prescriber Address (if known): No data recorded  What Is the Reason for Your Visit/Call Today? Patient presents to ED due to having SI  How Long Has This Been Causing You Problems? 1 wk - 1 month  What Do You Feel Would Help You the Most Today? Treatment for Depression or other mood problem   Have You Recently Been in Any Inpatient Treatment (Hospital/Detox/Crisis Center/28-Day Program)? No data recorded Name/Location of Program/Hospital:No data recorded How Long Were You There? No data recorded When Were You Discharged? No data recorded  Have You Ever Received Services From [facility] Before? No data recorded Who Do You [facility]? No data recorded  Have You Recently Had Any Thoughts About Hurting Yourself? Yes  Are You Planning to Commit Suicide/Harm Yourself At This time? No   Have you Recently Had Thoughts About Hurting Someone Karolee Ohslse? No  Explanation: No data recorded  Have You Used Any Alcohol or Drugs in the Past 24 Hours? No  How Long Ago Did You Use Drugs or Alcohol? No data recorded What Did You Use and How Much? Crack cocaine; unknown amount.   Do You Currently Have a Therapist/Psychiatrist? No  Name of Therapist/Psychiatrist: No data recorded  Have You Been Recently Discharged From Any Office Practice or Programs? No  Explanation of Discharge From Practice/Program: No data recorded  CCA Screening Triage Referral Assessment Type of Contact: Face-to-Face  Is this Initial or Reassessment? No data recorded Date Telepsych consult ordered in [facility]:  No data recorded Time Telepsych consult ordered in [facility]:  No data recorded  Patient Reported Information Reviewed? No data recorded Patient Left Without Being Seen? No data recorded Reason for Not Completing  Assessment: No data recorded  Collateral Involvement: No data recorded  Does Patient Have a Court Appointed Legal Guardian? No data recorded Name and Contact of Legal Guardian: No data recorded If Minor and Not Living with Parent(s), Who has Custody? No data recorded Is CPS involved or ever been involved? Never  Is APS involved or ever been involved? Never   Patient Determined To Be At Risk for Harm To Self or Others Based on Review of Patient Reported Information or Presenting Complaint? No  Method: No data recorded Availability of Means: No data recorded Intent: No data recorded Notification Required: No data recorded Additional Information for Danger to Others Potential: No data recorded Additional Comments for Danger to Others Potential: No data recorded Are There Guns or Other Weapons in Your Home? No data recorded Types of Guns/Weapons: No data recorded Are These Weapons Safely Secured?                            No data recorded Who Could Verify You Are Able To Have These Secured: No data recorded Do You Have any Outstanding Charges, Pending Court Dates, Parole/Probation? No data recorded Contacted To Inform of Risk of Harm To Self or Others: No data recorded  Location of Assessment: [facility] ED   Does Patient Present under Involuntary Commitment? Yes  IVC Papers Initial File Date: 09/04/20   [place] of Residence: [place]   Patient Currently Receiving the Following Services: Not Receiving Services   Determination of Need: Emergent (2 hours)   Options For Referral: -- (Per Madaline Brilliant., NP  pt is recommended for substance abuse treatment.)     CCA Biopsychosocial Intake/Chief Complaint:  No data recorded Current Symptoms/Problems: No data recorded  Patient Reported Schizophrenia/Schizoaffective Diagnosis in Past: No   Strengths: Patient is able to communicate  Preferences: No data recorded Abilities: No data recorded  Type of Services Patient Feels are  Needed: No data recorded  Initial Clinical Notes/Concerns: No data recorded  Mental Health Symptoms Depression:   Change in energy/activity; Fatigue; Hopelessness; Increase/decrease in appetite; Irritability; Sleep (too much or little); Tearfulness; Worthlessness   Duration of Depressive symptoms:  Less than two weeks   Mania:   None   Anxiety:    Worrying; Irritability   Psychosis:   None   Duration of Psychotic symptoms:  Less than six months   Trauma:   None   Obsessions:   None   Compulsions:   None   Inattention:   None   Hyperactivity/Impulsivity:   None   Oppositional/Defiant Behaviors:   None   Emotional Irregularity:   None   Other Mood/Personality Symptoms:  No data recorded   Mental Status Exam Appearance and self-care  Stature:   Tall   Weight:   Average weight   Clothing:   Casual   Grooming:   Normal   Cosmetic use:   None   Posture/gait:   Normal   Motor activity:   Not Remarkable   Sensorium  Attention:   Normal   Concentration:   Normal   Orientation:   X5   Recall/memory:  Normal   Affect and Mood  Affect:   Depressed   Mood:   Depressed   Relating  Eye contact:   Avoided   Facial expression:   Depressed   Attitude toward examiner:   Cooperative   Thought and Language  Speech flow:  Clear and Coherent   Thought content:   Appropriate to Mood and Circumstances   Preoccupation:   None   Hallucinations:   None   Organization:  No data recorded  [employer] of Knowledge:   Fair   Intelligence:   Average   Abstraction:   Normal   Judgement:   Good   Reality Testing:   Realistic   Insight:   Fair   Decision Making:   Normal   Social Functioning  Social Maturity:   Responsible   Social Judgement:   Normal   Stress  Stressors:   Other (Comment); Housing; Office manager Ability:   Exhausted   Skill Deficits:   None   Supports:   Family      Religion: Religion/Spirituality Are You A Religious Person?: No  Leisure/Recreation:    Exercise/Diet: Exercise/Diet Do You Exercise?: No Have You Gained or Lost A Significant Amount of Weight in the Past Six Months?: No Do You Follow a Special Diet?: No Do You Have Any Trouble Sleeping?: Yes Explanation of Sleeping Difficulties: Patient reports difficulty sleeping   CCA Employment/Education Employment/Work Situation: Employment / Work Situation Employment Situation: Unemployed Patient's Job has Been Impacted by Current Illness: No Has Patient ever Been in Equities trader?: No  Education: Education Did You Have An Individualized Education Program (IIEP): No Did You Have Any Difficulty At [employer]?: No Patient's Education Has Been Impacted by Current Illness: No   CCA Family/Childhood History Family and Relationship History: Family history Marital status: Married What types of issues is patient dealing with in the relationship?: Pt and his wife are currently in a housing crisis. Does patient have children?: Yes How many children?: 11 How is patient's relationship with their children?: Strained  Childhood History:  Childhood History By whom was/is the patient raised?:  (UTA) Did patient suffer any verbal/emotional/physical/sexual abuse as a child?:  (UTA) Did patient suffer from severe childhood neglect?:  (UTA) Has patient ever been sexually abused/assaulted/raped as an adolescent or adult?:  (UTA) Was the patient ever a victim of a crime or a disaster?:  (UTA) Witnessed domestic violence?:  (UTA) Has patient been affected by domestic violence as an adult?:  Industrial/product designer)  Child/Adolescent Assessment:     CCA Substance Use Alcohol/Drug Use: Alcohol / Drug Use Pain Medications: See MAR Prescriptions: See MAR Over the Counter: See MAR History of alcohol / drug use?: Yes Longest period of sobriety (when/how long): Unknown Negative Consequences of Use: Personal  relationships, Financial Withdrawal Symptoms: None Substance #1 Name of Substance 1: Crack Cocaine 1 - Last Use / Amount: 09/02/20 Unknown amount Substance #2 Name of Substance 2: Alcohol 2 - Frequency: Per patient "every now and then"                     ASAM's:  Six Dimensions of Multidimensional Assessment  Dimension 1:  Acute Intoxication and/or Withdrawal Potential:   Dimension 1:  Description of individual's past and current experiences of substance use and withdrawal: None reported  Dimension 2:  Biomedical Conditions and Complications:   Dimension 2:  Description of patient's biomedical conditions and  complications: CAD  Dimension 3:  Emotional, Behavioral, or  Cognitive Conditions and Complications:  Dimension 3:  Description of emotional, behavioral, or cognitive conditions and complications: Major Depression  Dimension 4:  Readiness to Change:  Dimension 4:  Description of Readiness to Change criteria: Pt motivated for change  Dimension 5:  Relapse, Continued use, or Continued Problem Potential:  Dimension 5:  Relapse, continued use, or continued problem potential critiera description: Pt has psychosocial stressors  Dimension 6:  Recovery/Living Environment:  Dimension 6:  Recovery/Iiving environment criteria description: Pt has unstable housing  ASAM Severity Score: ASAM's Severity Rating Score: 20  ASAM Recommended Level of Treatment: ASAM Recommended Level of Treatment: Level III Residential Treatment   Substance use Disorder (SUD) Substance Use Disorder (SUD)  Checklist Symptoms of Substance Use: Continued use despite having a persistent/recurrent physical/psychological problem caused/exacerbated by use, Recurrent use that results in a failure to fulfill major role obligations (work, school, home), Repeated use in physically hazardous situations, Continued use despite persistent or recurrent social, interpersonal problems, caused or exacerbated by  use  Recommendations for Services/Supports/Treatments: Recommendations for Services/Supports/Treatments Recommendations For Services/Supports/Treatments: Inpatient Hospitalization  DSM5 Diagnoses: Patient Active Problem List   Diagnosis Date Noted   MDD (major depressive disorder), recurrent episode, severe (HCC) 09/02/2020   Syncope 08/06/2020   Intractable abdominal pain 03/08/2015   Hypertensive urgency 03/08/2015   Intractable nausea and vomiting 03/07/2015   HTN (hypertension) 02/14/2011   CAD (coronary artery disease) 08/13/2010   Stented coronary artery 08/13/2010   Diabetes mellitus (HCC) 08/13/2010   Hyperlipemia 08/13/2010    Patient Centered Plan: Patient is on the following Treatment Plan(s):  Depression and Substance Abuse   Referrals to Alternative Service(s): Referred to Alternative Service(s):   Place:   Date:   Time:    Referred to Alternative Service(s):   Place:   Date:   Time:    Referred to Alternative Service(s):   Place:   Date:   Time:    Referred to Alternative Service(s):   Place:   Date:   Time:     Coretha Creswell A Torrin Crihfield, LCAS-A

## 2020-09-05 NOTE — ED Notes (Signed)
Ronnie Camera operator with EMS to see if he can find out where pt was picked up before arrival to hospital as pt states this will be the address he needs to go back to.

## 2020-09-05 NOTE — BH Assessment (Signed)
Writer spoke with the patient to complete an updated/reassessment. Patient denies SI/HI and AV/H. Patient was focus on seeing his wife.

## 2020-09-05 NOTE — Consult Note (Signed)
[facility] ED Discharge  09/05/2020 11:34 AM Steve Alvarado  MRN:  [id number]  Method of visit?: Face to Face   Principal Problem: Adjustment disorder with emotional disturbance Discharge Diagnoses: Principal Problem:   Adjustment disorder with emotional disturbance Active Problems:   Cocaine abuse (HCC)   Subjective: "I just want to see my wife right now."  65 yo male came to the ED after stating he was suicidal so he could see his ex-wife who is here.  Her family does not want him to see her as she recently allowed him to move back in with her and his substance use caused them to be evicted.  They also feel he triggered her recent emotional issues.  However, she was also placed on [place] vs Invega monthly and this was not controlling her symptoms.  Regardless, he is not suicidal or homicidal or psychotic.  He does use cocaine at times, no withdrawal symptoms.  He states he can go live with his aunt in [place] who does not drive, he will need a ride. Psychiatrically stable for discharge.  Total Time spent with patient: 45 minutes  Past Psychiatric History: cocaine abuse  Past Medical History:  Past Medical History:  Diagnosis Date   Coronary artery disease    Diabetes mellitus    Type II   Hyperlipidemia    Hypertension    Thyroid disease     Past Surgical History:  Procedure Laterality Date   CARDIAC CATHETERIZATION  08/06/2010   Bare metal stent placed in RCA.   HAND SURGERY     left   Family History:  Family History  Problem Relation Age of Onset   Heart attack Mother    Family Psychiatric  History: none Social History:  Social History   Substance and Sexual Activity  Alcohol Use No     Social History   Substance and Sexual Activity  Drug Use Not Currently    Social History   Socioeconomic History   Marital status: Married    Spouse name: Not on file   Number of children: Not on file   Years of education: Not on file   Highest education level:  Not on file  Occupational History   Not on file  Tobacco Use   Smoking status: Never   Smokeless tobacco: Never  Substance and Sexual Activity   Alcohol use: No   Drug use: Not Currently   Sexual activity: Not on file  Other Topics Concern   Not on file  Social History Narrative   Not on file   Social Determinants of Health   Financial Resource Strain: Not on file  Food Insecurity: Not on file  Transportation Needs: Not on file  Physical Activity: Not on file  Stress: Not on file  Social Connections: Not on file    Tobacco Cessation:  A prescription for an FDA-approved tobacco cessation medication was offered at discharge and the patient refused  Current Medications: Current Facility-Administered Medications  Medication Dose Route Frequency Provider Last Rate Last Admin   amLODipine (NORVASC) tablet 10 mg  10 mg Oral Daily Dionne Bucy, MD   10 mg at 09/05/20 1044   amoxicillin-clavulanate (AUGMENTIN) 875-125 MG per tablet 1 tablet  1 tablet Oral BID Dionne Bucy, MD   1 tablet at 09/05/20 1042   atorvastatin (LIPITOR) tablet 40 mg  40 mg Oral QHS Dionne Bucy, MD       citalopram (CELEXA) tablet 20 mg  20 mg Oral Daily Siadecki,  Wilmon Pali, MD   20 mg at 09/05/20 1043   gabapentin (NEURONTIN) capsule 900 mg  900 mg Oral BID Dionne Bucy, MD   900 mg at 09/05/20 1044   hydrochlorothiazide (HYDRODIURIL) tablet 25 mg  25 mg Oral Daily Dionne Bucy, MD   25 mg at 09/05/20 1043   levothyroxine (SYNTHROID) tablet 50 mcg  50 mcg Oral Daily Dionne Bucy, MD   50 mcg at 09/05/20 1043   losartan (COZAAR) tablet 100 mg  100 mg Oral Daily Dionne Bucy, MD   100 mg at 09/05/20 1045   metFORMIN (GLUCOPHAGE) tablet 1,000 mg  1,000 mg Oral BID Dionne Bucy, MD   1,000 mg at 09/05/20 1045   metroNIDAZOLE (FLAGYL) tablet 500 mg  500 mg Oral TID Dionne Bucy, MD   500 mg at 09/05/20 1042   mirtazapine (REMERON) tablet 15 mg  15 mg  Oral QHS Dionne Bucy, MD       oxyCODONE-acetaminophen (PERCOCET) 7.5-325 MG per tablet 1 tablet  1 tablet Oral BID PRN Dionne Bucy, MD   1 tablet at 09/05/20 1043   Current Outpatient Medications  Medication Sig Dispense Refill   amLODipine (NORVASC) 10 MG tablet Take 10 mg by mouth daily.     amoxicillin-clavulanate (AUGMENTIN) 875-125 MG tablet Take 1 tablet by mouth 2 (two) times daily for 10 days. 20 tablet 0   atorvastatin (LIPITOR) 40 MG tablet Take 1 tablet (40 mg total) by mouth every evening. 30 tablet 0   citalopram (CELEXA) 20 MG tablet Take 1 tablet (20 mg total) by mouth daily. 30 tablet 0   gabapentin (NEURONTIN) 300 MG capsule Take 900 mg by mouth 2 (two) times daily.     hydrochlorothiazide (HYDRODIURIL) 25 MG tablet Take 1 tablet (25 mg total) by mouth daily. 30 tablet 0   insulin glargine (LANTUS) 100 unit/mL SOPN Inject 60 Units into the skin 2 (two) times daily.     levothyroxine (SYNTHROID) 50 MCG tablet Take 50 mcg by mouth daily.     liraglutide (VICTOZA) 18 MG/3ML SOPN Inject 1.2 mg into the skin in the morning.     losartan (COZAAR) 100 MG tablet Take 100 mg by mouth daily.     metFORMIN (GLUCOPHAGE) 1000 MG tablet Take 1,000 mg by mouth 2 (two) times daily.     metroNIDAZOLE (FLAGYL) 500 MG tablet Take 1 tablet (500 mg total) by mouth 3 (three) times daily for 10 days. 30 tablet 0   mirtazapine (REMERON) 15 MG tablet Take 7.5 mg by mouth daily.     oxyCODONE-acetaminophen (PERCOCET) 7.5-325 MG tablet Take 1 tablet by mouth 2 (two) times daily as needed.     PTA Medications: (Not in a hospital admission)   Musculoskeletal: Strength & Muscle Tone: within normal limits Gait & Station: normal Patient leans: N/A  Psychiatric Specialty Exam:  Presentation  General Appearance: Appropriate for Environment  Eye Contact:Fair  Speech:Clear and Coherent  Speech Volume:  WDL  Handedness:Right   Mood and Affect  Mood:Anxious, mild  Affect:   Congruent  Thought Process  Thought Processes:Coherent  Descriptions of Associations:Intact  Orientation:Full (Time, Place and Person)  Thought Content:  WDL  History of Schizophrenia/Schizoaffective disorder:No  Duration of Psychotic Symptoms:N/A  Hallucinations:None Ideas of Reference:None  Suicidal Thoughts:  None   Homicidal Thoughts:Homicidal Thoughts: No  Sensorium  Memory:  Good  Judgment:Fair  Insight: Fair  Executive Functions  Concentration:  Good  Attention Span: Good  Recall:Good  Fund of Knowledge:Good  Language:Good  Psychomotor Activity  Psychomotor Activity:Psychomotor Activity: Normal  Assets  Assets:Leisure Time  Sleep  Sleep:Sleep: Fair   Physical Exam: Physical Exam Vitals and nursing note reviewed.  Constitutional:      Appearance: Normal appearance.  HENT:     Head: Normocephalic.     Nose: Nose normal.  Pulmonary:     Effort: Pulmonary effort is normal.  Musculoskeletal:        General: Normal range of motion.     Cervical back: Normal range of motion.  Neurological:     General: No focal deficit present.     Mental Status: He is alert and oriented to person, place, and time.  Psychiatric:        Attention and Perception: Attention and perception normal.        Mood and Affect: Mood is anxious.        Speech: Speech normal.        Behavior: Behavior normal. Behavior is cooperative.        Thought Content: Thought content normal.        Cognition and Memory: Cognition and memory normal.        Judgment: Judgment normal.   Review of Systems  Psychiatric/Behavioral:  The patient is nervous/anxious.   All other systems reviewed and are negative. Blood pressure (!) 158/90, pulse 75, temperature 97.9 F (36.6 C), temperature source Oral, resp. rate 18, SpO2 98 %. There is no height or weight on file to calculate BMI.   Demographic Factors:  Male and Age 34 or older  Loss Factors: NA  Historical  Factors: NA  Risk Reduction Factors:   Sense of responsibility to family, Living with another person, especially a relative, and Positive social support  Continued Clinical Symptoms:  Anxiety  Cognitive Features That Contribute To Risk:  None    Suicide Risk:  Minimal: No identifiable suicidal ideation.  Patients presenting with no risk factors but with morbid ruminations; may be classified as minimal risk based on the severity of the depressive symptoms   Plan Of Care/Follow-up recommendations:  Adjustment disorder with emotional disturbance -Continue Celexa 20 mg daily   Insomnia: -Continue Remeron 7.5 mg at bedtime  Cocaine abuse: -Recommend 12-step program with a sponsor Activity:  as tolerated Diet:  heart healthy diet  Disposition: discharge home Nanine Means, NP 09/05/2020, 11:34 AM

## 2020-09-05 NOTE — ED Notes (Signed)
Pt eating from lunch tray.  

## 2020-09-05 NOTE — ED Notes (Signed)
IVC, pending placement 

## 2020-09-05 NOTE — ED Notes (Signed)
Pt asleep at this time, unable to collect vitals. Will collect pt vitals once awake. 

## 2020-10-21 ENCOUNTER — Emergency Department: Payer: Medicare Other

## 2020-10-21 ENCOUNTER — Other Ambulatory Visit: Payer: Self-pay

## 2020-10-21 ENCOUNTER — Observation Stay
Admission: EM | Admit: 2020-10-21 | Discharge: 2020-10-23 | Disposition: A | Discharge status: Home / Self Care | Payer: Medicare Other | Attending: Internal Medicine | Admitting: Internal Medicine

## 2020-10-21 DIAGNOSIS — R072 Precordial pain: Secondary | ICD-10-CM | POA: Diagnosis not present

## 2020-10-21 DIAGNOSIS — R0789 Other chest pain: Secondary | ICD-10-CM | POA: Insufficient documentation

## 2020-10-21 DIAGNOSIS — Z79899 Other long term (current) drug therapy: Secondary | ICD-10-CM | POA: Insufficient documentation

## 2020-10-21 DIAGNOSIS — F1410 Cocaine abuse, uncomplicated: Secondary | ICD-10-CM | POA: Diagnosis present

## 2020-10-21 DIAGNOSIS — R52 Pain, unspecified: Secondary | ICD-10-CM

## 2020-10-21 DIAGNOSIS — Z7984 Long term (current) use of oral hypoglycemic drugs: Secondary | ICD-10-CM | POA: Diagnosis not present

## 2020-10-21 DIAGNOSIS — Z20822 Contact with and (suspected) exposure to covid-19: Secondary | ICD-10-CM | POA: Diagnosis not present

## 2020-10-21 DIAGNOSIS — E119 Type 2 diabetes mellitus without complications: Secondary | ICD-10-CM | POA: Diagnosis not present

## 2020-10-21 DIAGNOSIS — I2510 Atherosclerotic heart disease of native coronary artery without angina pectoris: Secondary | ICD-10-CM | POA: Diagnosis not present

## 2020-10-21 DIAGNOSIS — E039 Hypothyroidism, unspecified: Secondary | ICD-10-CM | POA: Diagnosis not present

## 2020-10-21 DIAGNOSIS — F32A Depression, unspecified: Secondary | ICD-10-CM | POA: Diagnosis not present

## 2020-10-21 DIAGNOSIS — G8929 Other chronic pain: Secondary | ICD-10-CM | POA: Insufficient documentation

## 2020-10-21 DIAGNOSIS — R55 Syncope and collapse: Secondary | ICD-10-CM | POA: Diagnosis not present

## 2020-10-21 DIAGNOSIS — I10 Essential (primary) hypertension: Secondary | ICD-10-CM

## 2020-10-21 DIAGNOSIS — Z9114 Patient's other noncompliance with medication regimen: Secondary | ICD-10-CM

## 2020-10-21 DIAGNOSIS — M25562 Pain in left knee: Secondary | ICD-10-CM | POA: Insufficient documentation

## 2020-10-21 DIAGNOSIS — R079 Chest pain, unspecified: Secondary | ICD-10-CM | POA: Diagnosis present

## 2020-10-21 DIAGNOSIS — Z91148 Patient's other noncompliance with medication regimen for other reason: Secondary | ICD-10-CM

## 2020-10-21 LAB — CBC WITH DIFFERENTIAL/PLATELET
Abs Immature Granulocytes: 0.03 10*3/uL (ref 0.00–0.07)
Basophils Absolute: 0 10*3/uL (ref 0.0–0.1)
Basophils Relative: 1 %
Eosinophils Absolute: 0.1 10*3/uL (ref 0.0–0.5)
Eosinophils Relative: 2 %
HCT: 36.2 % — ABNORMAL LOW (ref 39.0–52.0)
Hemoglobin: 13.1 g/dL (ref 13.0–17.0)
Immature Granulocytes: 0 %
Lymphocytes Relative: 24 %
Lymphs Abs: 1.9 10*3/uL (ref 0.7–4.0)
MCH: 30.6 pg (ref 26.0–34.0)
MCHC: 36.2 g/dL — ABNORMAL HIGH (ref 30.0–36.0)
MCV: 84.6 fL (ref 80.0–100.0)
Monocytes Absolute: 0.8 10*3/uL (ref 0.1–1.0)
Monocytes Relative: 10 %
Neutro Abs: 4.9 10*3/uL (ref 1.7–7.7)
Neutrophils Relative %: 63 %
Platelets: 237 10*3/uL (ref 150–400)
RBC: 4.28 MIL/uL (ref 4.22–5.81)
RDW: 12.3 % (ref 11.5–15.5)
WBC: 7.8 10*3/uL (ref 4.0–10.5)
nRBC: 0 % (ref 0.0–0.2)

## 2020-10-21 LAB — BASIC METABOLIC PANEL
Anion gap: 13 (ref 5–15)
BUN: 33 mg/dL — ABNORMAL HIGH (ref 8–23)
CO2: 23 mmol/L (ref 22–32)
Calcium: 9.6 mg/dL (ref 8.9–10.3)
Chloride: 94 mmol/L — ABNORMAL LOW (ref 98–111)
Creatinine, Ser: 1.57 mg/dL — ABNORMAL HIGH (ref 0.61–1.24)
GFR, Estimated: 48 mL/min — ABNORMAL LOW (ref >60)
Glucose, Bld: 428 mg/dL — ABNORMAL HIGH (ref 70–99)
Potassium: 4.3 mmol/L (ref 3.5–5.1)
Sodium: 130 mmol/L — ABNORMAL LOW (ref 135–145)

## 2020-10-21 LAB — RESP PANEL BY RT-PCR (FLU A&B, COVID) ARPGX2
Influenza A by PCR: NEGATIVE
Influenza B by PCR: NEGATIVE
SARS Coronavirus 2 by RT PCR: NEGATIVE

## 2020-10-21 LAB — URINE DRUG SCREEN, QUALITATIVE (ARMC ONLY)
Amphetamines, Ur Screen: NOT DETECTED
Barbiturates, Ur Screen: NOT DETECTED
Benzodiazepine, Ur Scrn: NOT DETECTED
Cannabinoid 50 Ng, Ur ~~LOC~~: NOT DETECTED
Cocaine Metabolite,Ur ~~LOC~~: POSITIVE — AB
MDMA (Ecstasy)Ur Screen: NOT DETECTED
Methadone Scn, Ur: NOT DETECTED
Opiate, Ur Screen: NOT DETECTED
Phencyclidine (PCP) Ur S: NOT DETECTED
Tricyclic, Ur Screen: NOT DETECTED

## 2020-10-21 LAB — D-DIMER, QUANTITATIVE: D-Dimer, Quant: 0.57 ug/mL-FEU — ABNORMAL HIGH (ref 0.00–0.50)

## 2020-10-21 LAB — HEMOGLOBIN A1C
Hgb A1c MFr Bld: 13.5 % — ABNORMAL HIGH (ref 4.8–5.6)
Mean Plasma Glucose: 340.75 mg/dL

## 2020-10-21 LAB — TROPONIN I (HIGH SENSITIVITY)
Troponin I (High Sensitivity): 27 ng/L — ABNORMAL HIGH (ref <18)
Troponin I (High Sensitivity): 24 ng/L — ABNORMAL HIGH (ref <18)

## 2020-10-21 LAB — CBG MONITORING, ED
Glucose-Capillary: 340 mg/dL — ABNORMAL HIGH (ref 70–99)
Glucose-Capillary: 242 mg/dL — ABNORMAL HIGH (ref 70–99)

## 2020-10-21 IMAGING — CT CT CERVICAL SPINE W/O CM
3 of 4 series · 11 of 33 positions shown, 13 images · non-contrast
Comparison: [DATE]

CLINICAL DATA: Neck trauma

EXAM:
CT CERVICAL SPINE WITHOUT CONTRAST
TECHNIQUE: Multidetector CT imaging of the cervical spine was performed without
intravenous contrast. Multiplanar CT image reconstructions were also
generated.

[Series 4: sagittal bone · sagittal · 0.29mm/px · 5 of 113 slices shown, 6 images]
[im 38/113  bone]
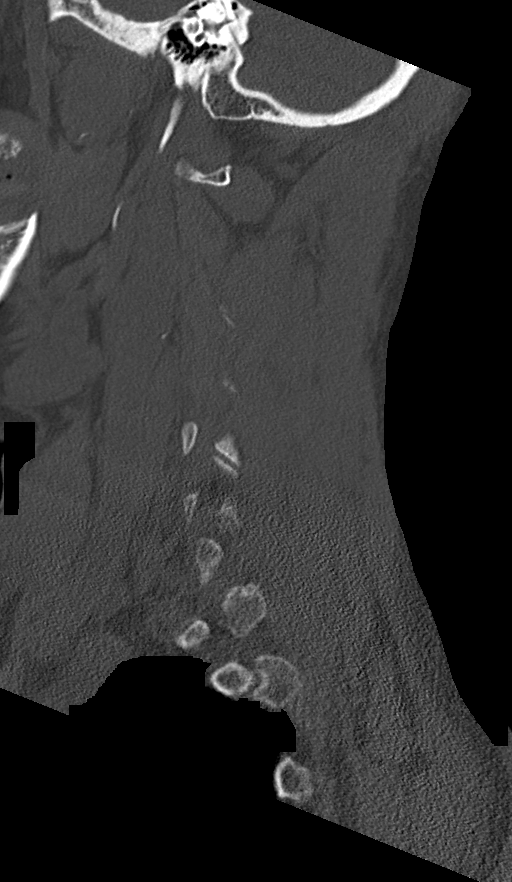
[im 47/113  bone]
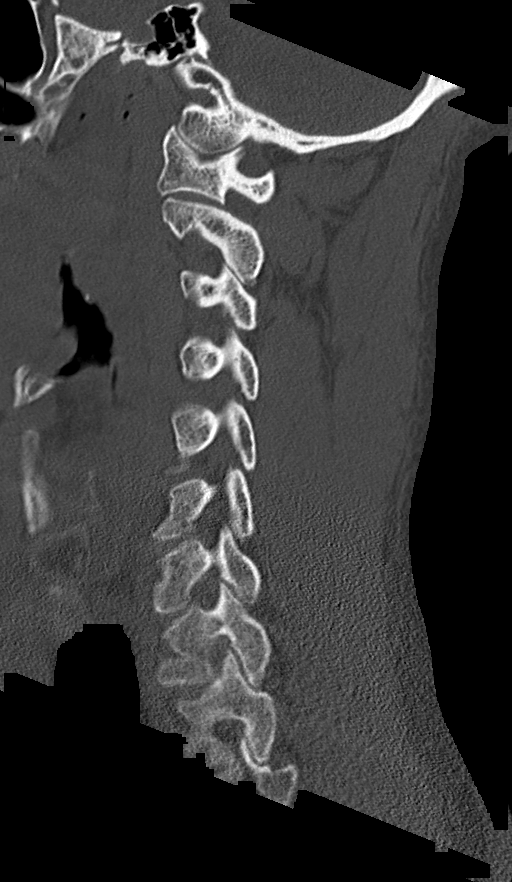
[im 57/113  soft-tissue]
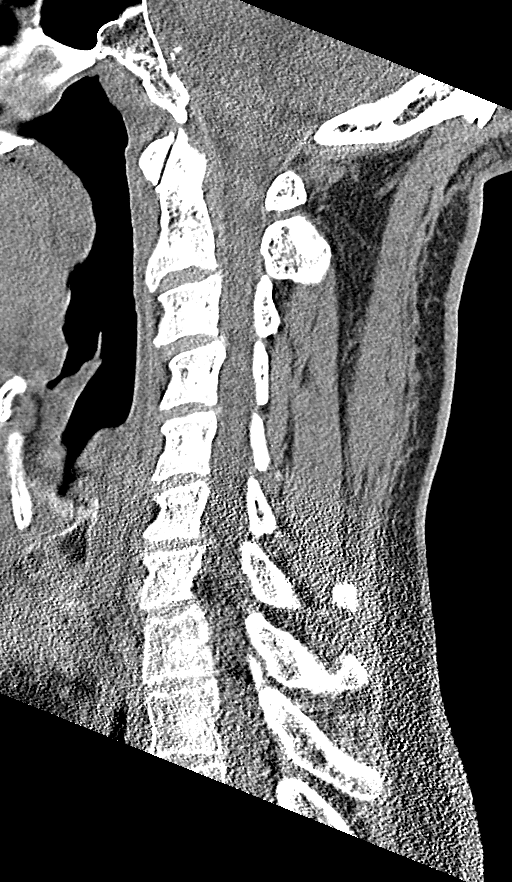
[im 57/113  bone]
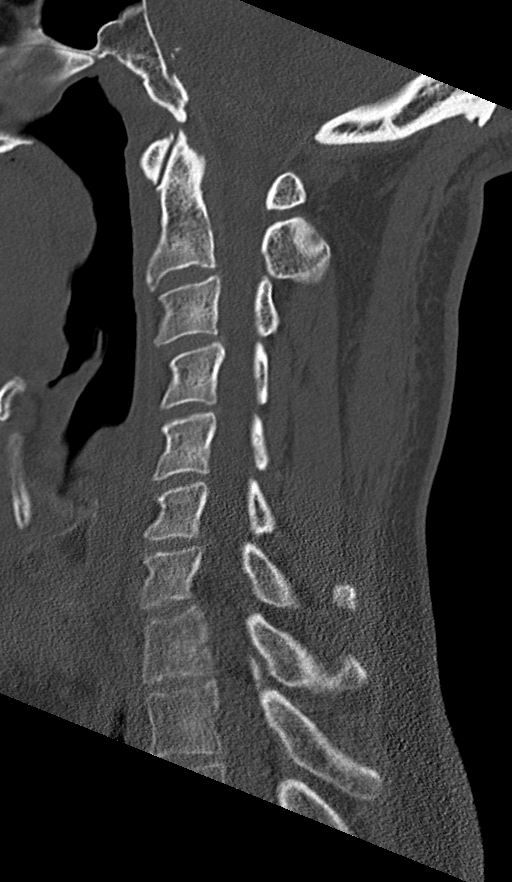
[im 66/113  bone]
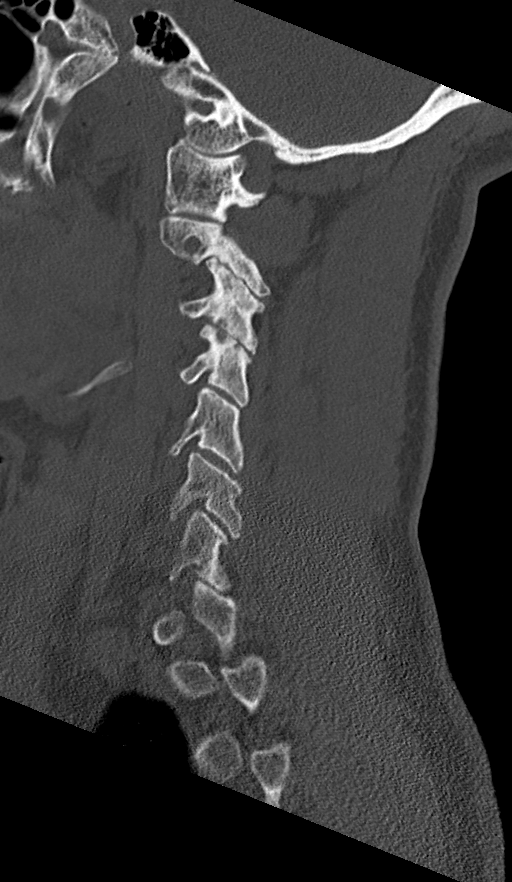
[im 75/113  bone]
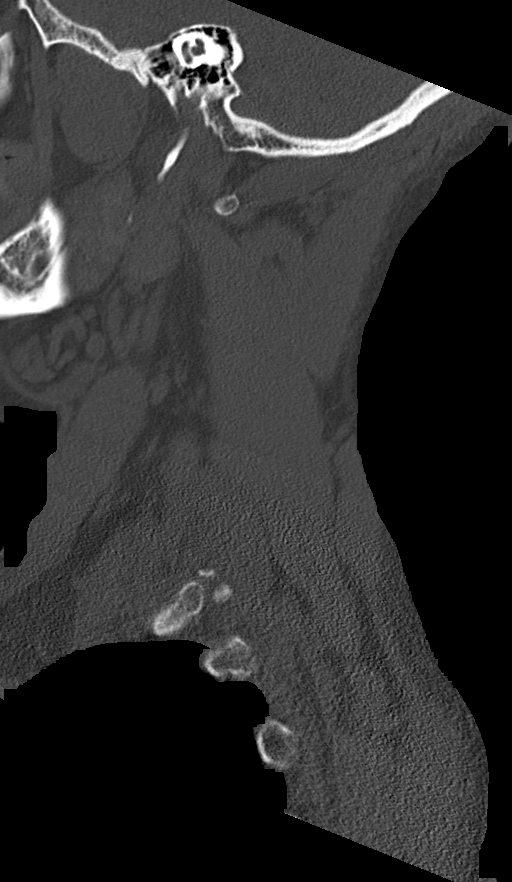

[Series 5: coronal bone · coronal · 0.31mm/px · 3 of 64 slices shown]
[im 13/64  bone]
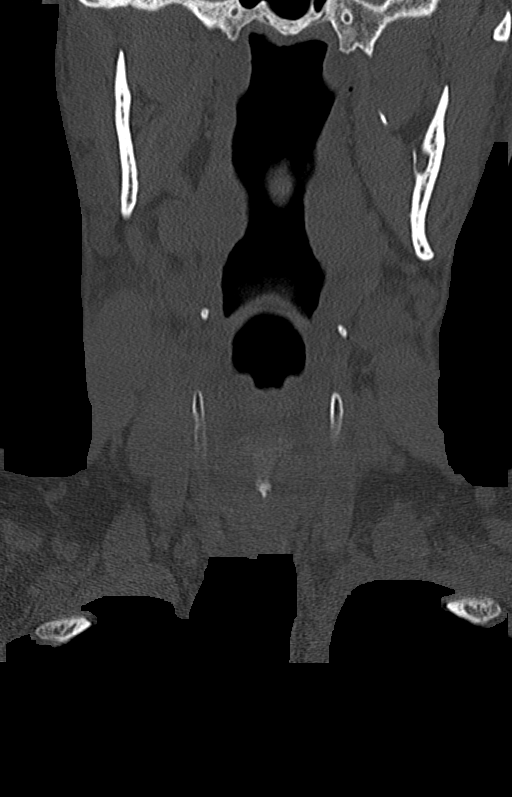
[im 26/64  bone]
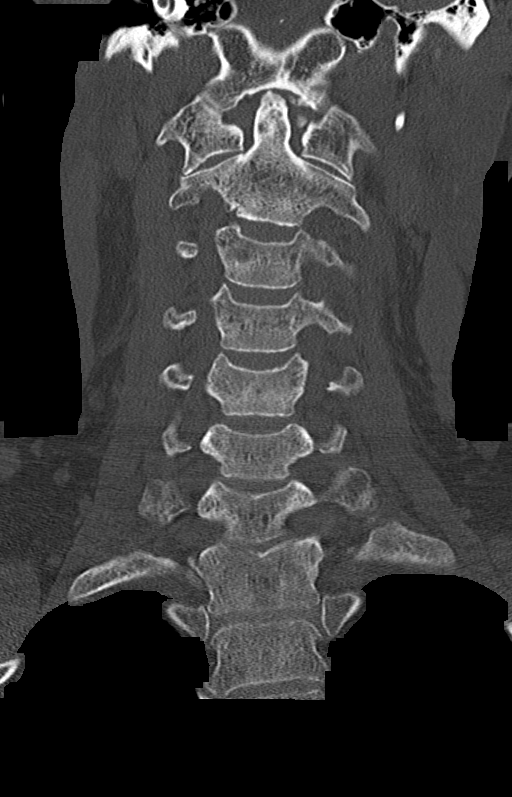
[im 38/64  bone]
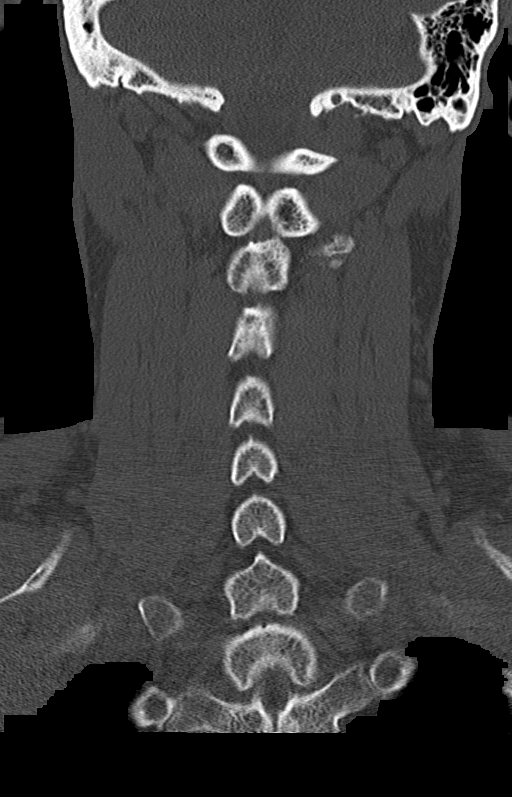

[Series 6: orthogonal bone · axial · 0.25mm/px · z∈[+176,+306]mm · 3 of 125 slices shown, 4 images]
[im 36/125  soft-tissue]
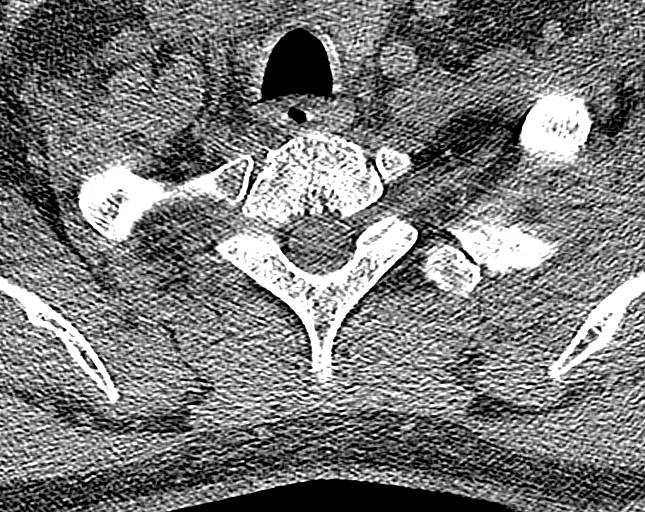
[im 36/125  bone]
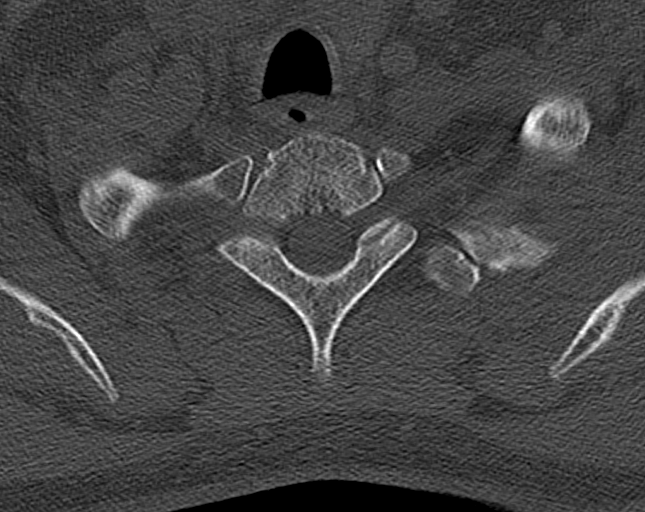
[im 71/125  bone]
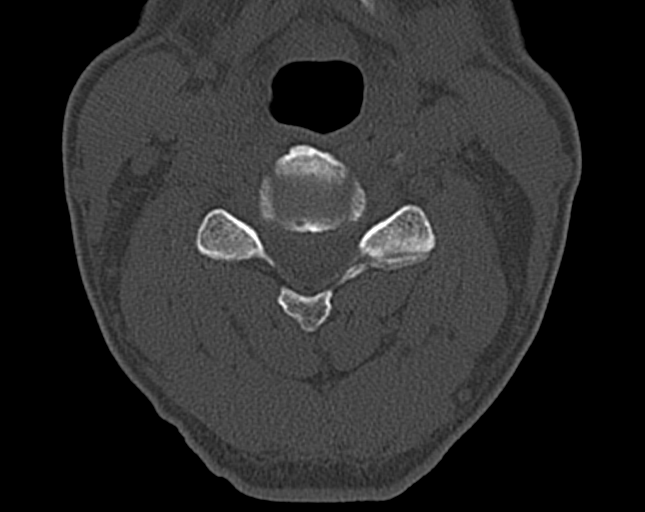
[im 107/125  bone]
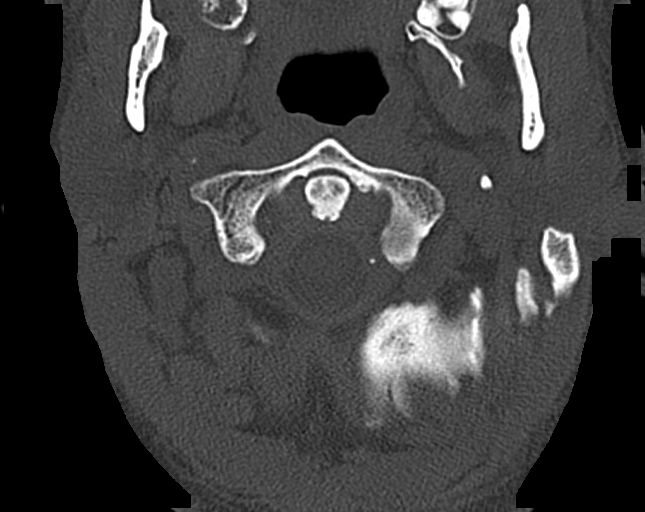

[11 of 33 positions shown; findings below may reference images not displayed]

FINDINGS: Alignment: Reversal of cervical lordosis.  No listhesis

Skull base and vertebrae: No acute fracture.

Soft tissues and spinal canal: No prevertebral fluid or swelling. No
visible canal hematoma.

Disc levels: Notable left-sided facet osteoarthritis at C2-3 and
C3-4. Generalized disc space narrowing that is mild for age

Upper chest: Negative
IMPRESSION: No evidence of cervical spine injury.

## 2020-10-21 IMAGING — CT CT HEAD W/O CM
3 series · 16 of 47 positions shown, 19 images · non-contrast
Comparison: [DATE]

CLINICAL DATA: Head trauma, minor. Intermittent pressure of the
chest and shortness of breath.

EXAM:
CT HEAD WITHOUT CONTRAST
TECHNIQUE: Contiguous axial images were obtained from the base of the skull
through the vertex without intravenous contrast.

[Series 3: head wo · axial · 0.44mm/px · z∈[+361,+491]mm · 10 of 32 slices shown, 13 images]
[im 3/32  brain]
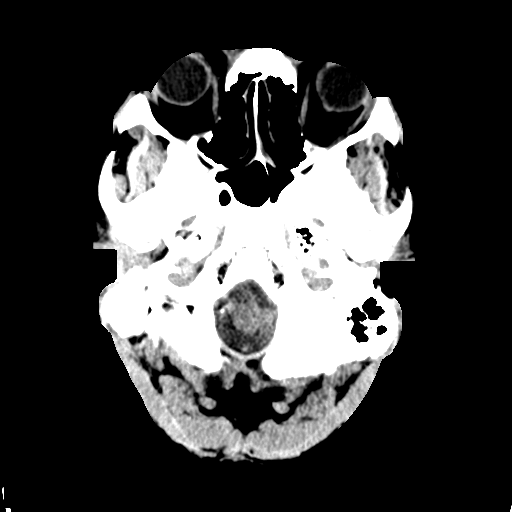
[im 3/32  bone]
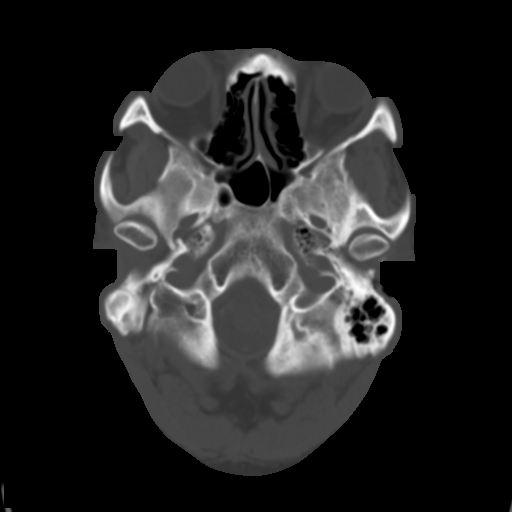
[im 6/32  brain]
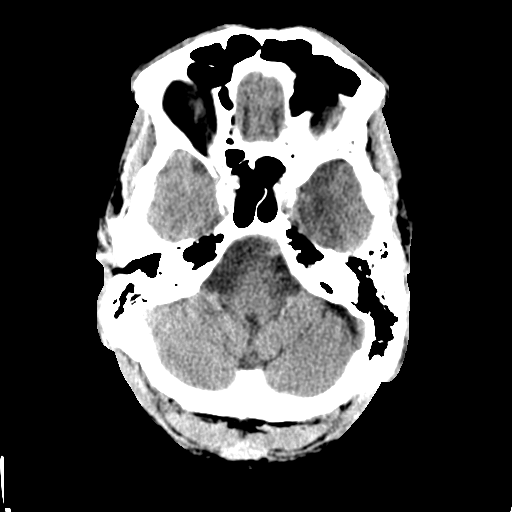
[im 9/32  brain]
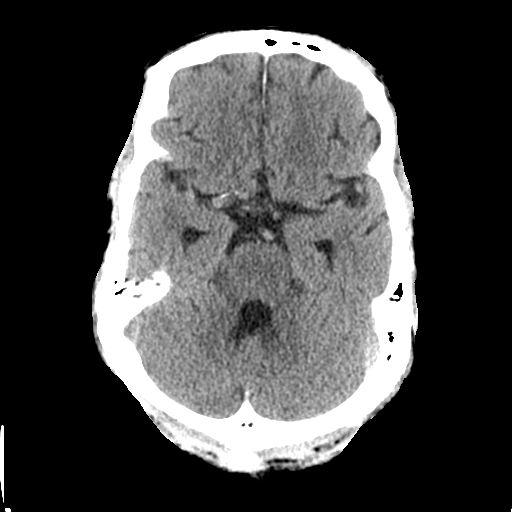
[im 11/32  brain]
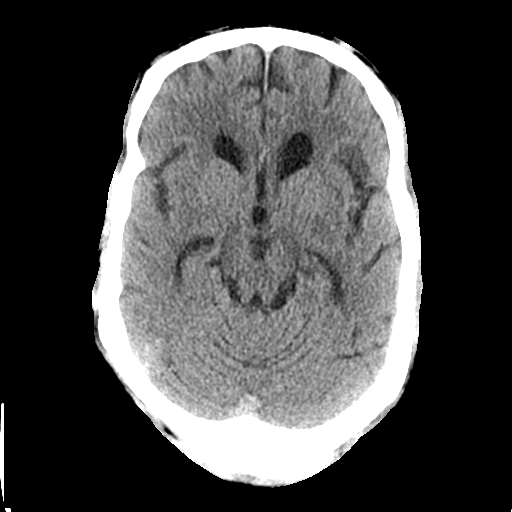
[im 14/32  brain]
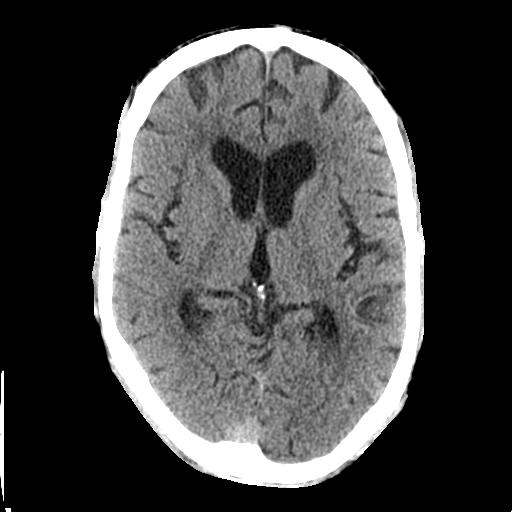
[im 14/32  bone]
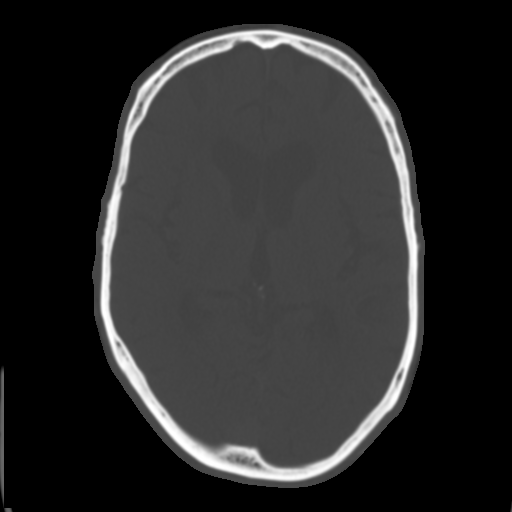
[im 18/32  brain]
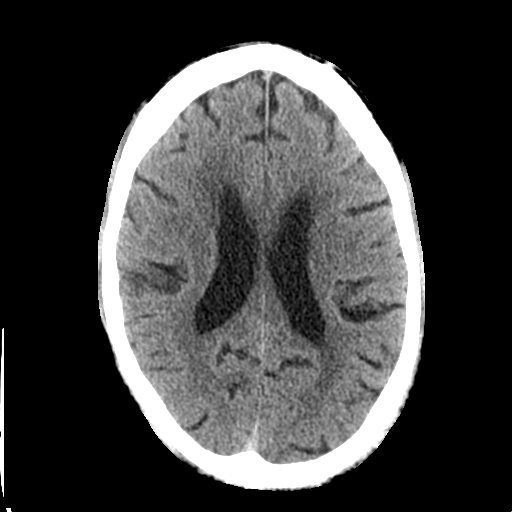
[im 21/32  brain]
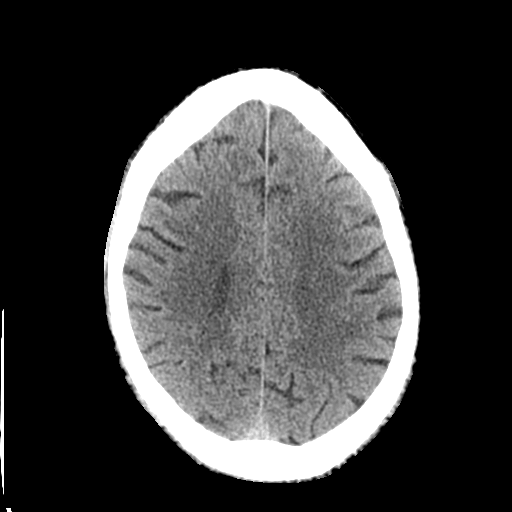
[im 24/32  brain]
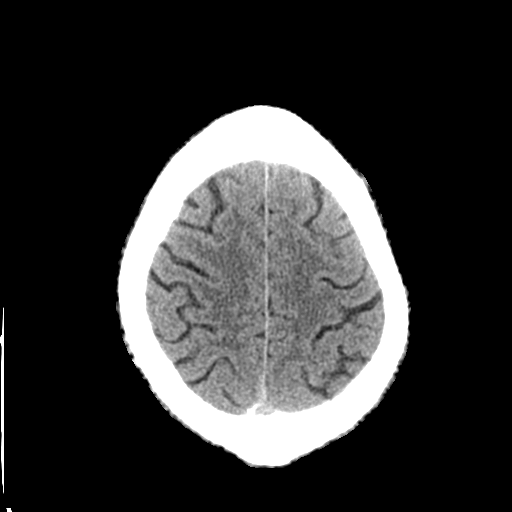
[im 26/32  brain]
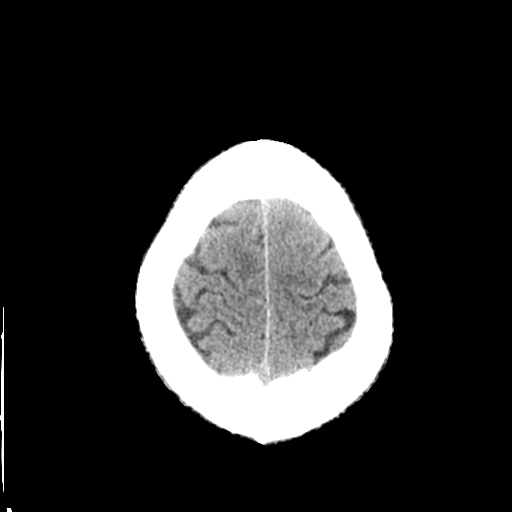
[im 26/32  bone]
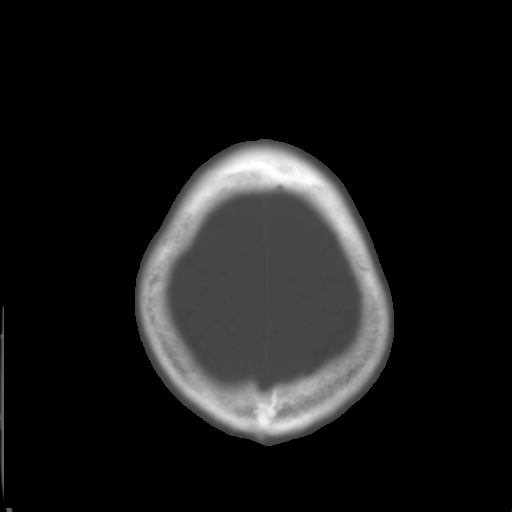
[im 29/32  brain]
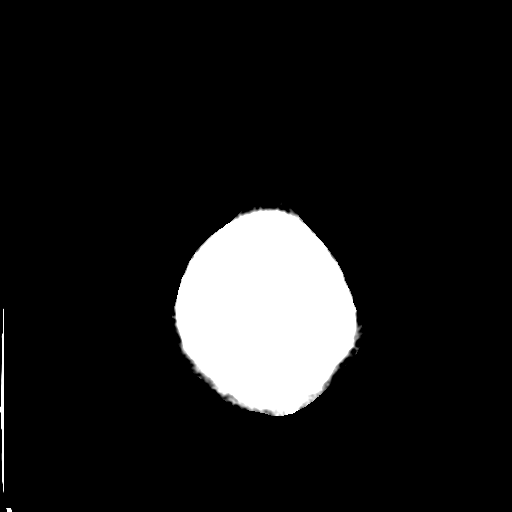

[Series 4: coronal soft tissue · coronal · 0.34mm/px · 3 of 71 slices shown]
[im 24/71  brain]
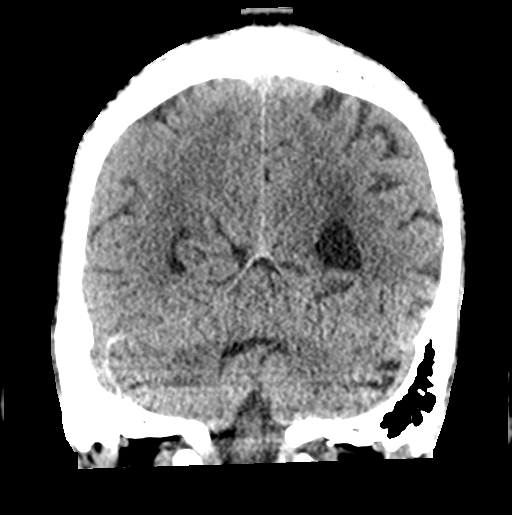
[im 32/71  brain]
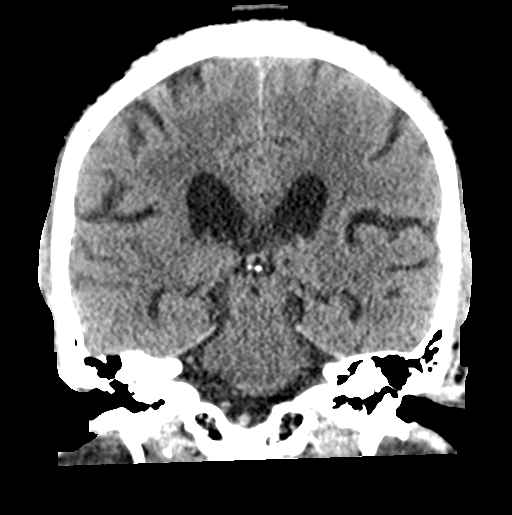
[im 39/71  brain]
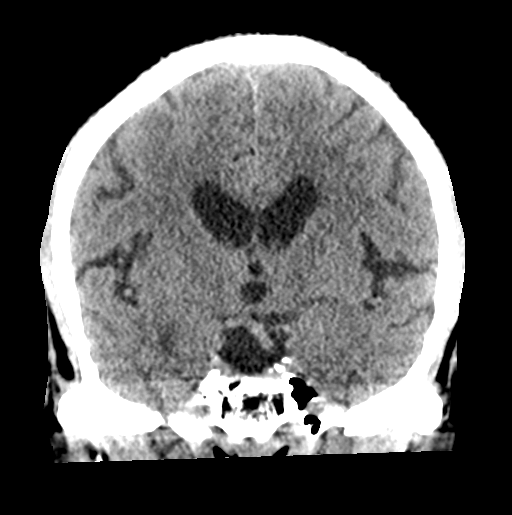

[Series 5: sagittal soft tissue · sagittal · 0.34mm/px · 3 of 58 slices shown]
[im 20/58  brain]
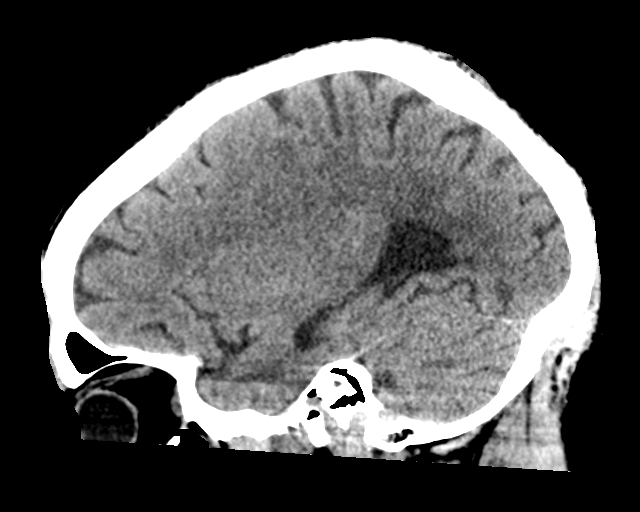
[im 29/58  brain]
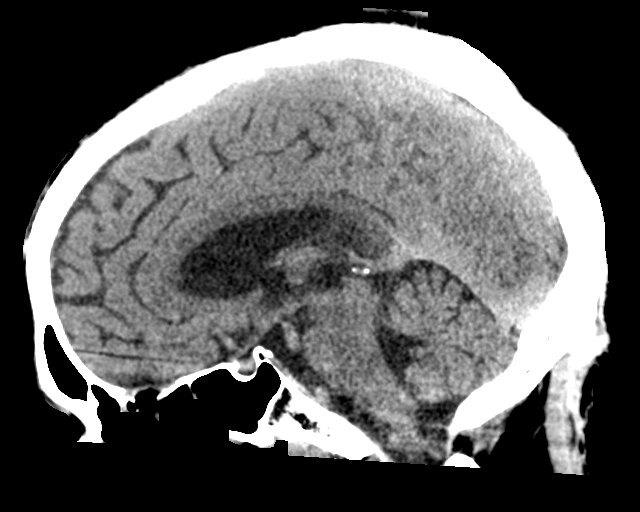
[im 39/58  brain]
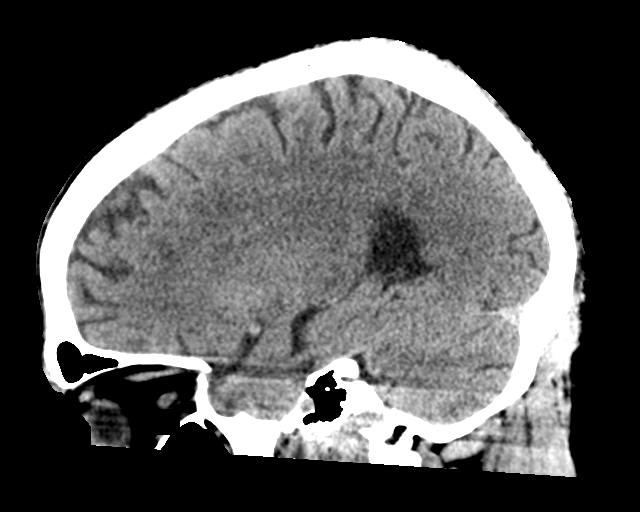

[16 of 47 positions shown; findings below may reference images not displayed]

FINDINGS: Brain: Mild age related atrophy. No sign of old or acute focal
infarction, mass lesion, hemorrhage, hydrocephalus or extra-axial
collection.

Vascular: There is atherosclerotic calcification of the major
vessels at the base of the brain.

Skull: Negative

Sinuses/Orbits: Clear/normal

Other: None
IMPRESSION: No acute or traumatic finding. Mild age related volume loss.
Atherosclerotic calcification of the major vessels at the base of
the brain, common at this age.

## 2020-10-21 MED ORDER — AMLODIPINE BESYLATE 10 MG PO TABS
10.0000 mg | ORAL_TABLET | Freq: Every day | ORAL | Status: DC
  Administered 2020-10-21 – 2020-10-22 (×2): 10 mg via ORAL
  Filled 2020-10-21 (×2): qty 2

## 2020-10-21 MED ORDER — LACTATED RINGERS IV BOLUS
1000.0000 mL | Freq: Once | INTRAVENOUS | Status: AC
  Administered 2020-10-21: 1000 mL via INTRAVENOUS

## 2020-10-21 MED ORDER — INSULIN ASPART 100 UNIT/ML IJ SOLN
0.0000 [IU] | Freq: Three times a day (TID) | INTRAMUSCULAR | Status: DC
  Administered 2020-10-21: 5 [IU] via SUBCUTANEOUS
  Administered 2020-10-21: 11 [IU] via SUBCUTANEOUS
  Administered 2020-10-22: 5 [IU] via SUBCUTANEOUS
  Administered 2020-10-22: 11 [IU] via SUBCUTANEOUS
  Administered 2020-10-22: 8 [IU] via SUBCUTANEOUS
  Administered 2020-10-23: 3 [IU] via SUBCUTANEOUS
  Filled 2020-10-21 (×7): qty 1

## 2020-10-21 MED ORDER — ONDANSETRON HCL 4 MG/2ML IJ SOLN: 4.0000 mg | Freq: Four times a day (QID) | INTRAMUSCULAR | Status: DC | PRN

## 2020-10-21 MED ORDER — ISOSORBIDE MONONITRATE ER 30 MG PO TB24
30.0000 mg | ORAL_TABLET | Freq: Every day | ORAL | Status: DC
  Administered 2020-10-21 – 2020-10-23 (×3): 30 mg via ORAL
  Filled 2020-10-21 (×3): qty 1

## 2020-10-21 MED ORDER — ASPIRIN EC 81 MG PO TBEC
81.0000 mg | DELAYED_RELEASE_TABLET | Freq: Every day | ORAL | Status: DC
  Administered 2020-10-21 – 2020-10-23 (×3): 81 mg via ORAL
  Filled 2020-10-21 (×3): qty 1

## 2020-10-21 MED ORDER — CITALOPRAM HYDROBROMIDE 20 MG PO TABS
20.0000 mg | ORAL_TABLET | Freq: Every day | ORAL | Status: DC
  Administered 2020-10-21 – 2020-10-23 (×3): 20 mg via ORAL
  Filled 2020-10-21 (×3): qty 1

## 2020-10-21 MED ORDER — ATORVASTATIN CALCIUM 20 MG PO TABS
40.0000 mg | ORAL_TABLET | Freq: Every evening | ORAL | Status: DC
  Administered 2020-10-21: 40 mg via ORAL
  Filled 2020-10-21: qty 2

## 2020-10-21 MED ORDER — HYDROCHLOROTHIAZIDE 25 MG PO TABS
25.0000 mg | ORAL_TABLET | Freq: Every day | ORAL | Status: DC
  Administered 2020-10-21: 25 mg via ORAL
  Filled 2020-10-21: qty 1

## 2020-10-21 MED ORDER — ENOXAPARIN SODIUM 40 MG/0.4ML IJ SOSY
40.0000 mg | PREFILLED_SYRINGE | INTRAMUSCULAR | Status: DC
  Administered 2020-10-21 – 2020-10-22 (×2): 40 mg via SUBCUTANEOUS
  Filled 2020-10-21 (×2): qty 0.4

## 2020-10-21 MED ORDER — LEVOTHYROXINE SODIUM 50 MCG PO TABS
50.0000 ug | ORAL_TABLET | Freq: Every day | ORAL | Status: DC
  Administered 2020-10-23: 50 ug via ORAL
  Filled 2020-10-21: qty 1

## 2020-10-21 MED ORDER — CARVEDILOL 6.25 MG PO TABS
6.2500 mg | ORAL_TABLET | Freq: Two times a day (BID) | ORAL | Status: DC
  Administered 2020-10-21 – 2020-10-22 (×2): 6.25 mg via ORAL
  Filled 2020-10-21 (×2): qty 1

## 2020-10-21 MED ORDER — LOSARTAN POTASSIUM 50 MG PO TABS
100.0000 mg | ORAL_TABLET | Freq: Every day | ORAL | Status: DC
  Administered 2020-10-21 – 2020-10-22 (×2): 100 mg via ORAL
  Filled 2020-10-21 (×2): qty 2

## 2020-10-21 MED ORDER — ACETAMINOPHEN 325 MG PO TABS: 650.0000 mg | ORAL_TABLET | ORAL | Status: DC | PRN

## 2020-10-21 MED ORDER — SODIUM CHLORIDE 0.45 % IV SOLN: INTRAVENOUS | Status: AC

## 2020-10-21 MED ORDER — MIRTAZAPINE 15 MG PO TABS
7.5000 mg | ORAL_TABLET | Freq: Every day | ORAL | Status: DC
  Administered 2020-10-21 – 2020-10-23 (×3): 7.5 mg via ORAL
  Filled 2020-10-21 (×3): qty 1

## 2020-10-21 NOTE — ED Notes (Signed)
Dr. Joylene Igo paged regarding pt BP 89/52 with MAP 62. Awaiting response.

## 2020-10-21 NOTE — Consult Note (Signed)
Cardiology Consultation:   Patient ID: Steve Alvarado MRN: [id number]; DOB: Aug 10, 1955  Admit date: 10/21/2020 Date of Consult: 10/21/2020  PCP:  Steve Plunk, MD   [facility] Providers Cardiologist:  Steve Alvarado here to update MD or APP on Care Team, Refresh:1}     Patient Profile:   Steve Alvarado is a 65 y.o. male with a hx of CAD/PCI, hypertension, diabetes who is being seen 10/21/2020 for the evaluation of chest pain at the request of Steve Alvarado.  History of Present Illness:   Steve Alvarado is a 65 year old gentleman with history of CAD/PCI to mid RCA 2012, hypertension, diabetes, hyperlipidemia, cocaine use who presents due to chest pain.  He describes midsternal chest discomfort not related with exertion ongoing over the past 2 days.  Endorses some shortness of breath.  Denies smoking but endorses cocaine use.  Patient very somnolent upon my exam, history limited by condition of patient.  States feeling tired, chest discomfort is still present, denies any trauma.  In the ED, EKG showed sinus rhythm, troponins were 27, 24.  Creatinine 1.57.  Prior studies/left heart cath 2015 showed mid left circumflex total occlusion, mid RCA in-stent 50% stenosis.  OM1 60% stenosed.   Past Medical History:  Diagnosis Date   Coronary artery disease    Diabetes mellitus    Type II   Hyperlipidemia    Hypertension    Thyroid disease     Past Surgical History:  Procedure Laterality Date   CARDIAC CATHETERIZATION  08/06/2010   Bare metal stent placed in RCA.   HAND SURGERY     left     Home Medications:  Prior to Admission medications   Medication Sig Start Date End Date Taking? Authorizing Provider  amLODipine (NORVASC) 10 MG tablet Take 1 tablet (10 mg total) by mouth daily. Patient not taking: Reported on 10/21/2020 09/05/20   Dionne Bucy, MD  atorvastatin (LIPITOR) 40 MG tablet Take 1 tablet (40 mg total) by mouth every evening. 09/05/20 10/05/20   Dionne Bucy, MD  citalopram (CELEXA) 20 MG tablet Take 1 tablet (20 mg total) by mouth daily. 09/05/20 10/05/20  Dionne Bucy, MD  gabapentin (NEURONTIN) 300 MG capsule Take 3 capsules (900 mg total) by mouth 2 (two) times daily. Patient not taking: Reported on 10/21/2020 09/05/20   Dionne Bucy, MD  hydrochlorothiazide (HYDRODIURIL) 25 MG tablet Take 1 tablet (25 mg total) by mouth daily. 08/10/20 09/09/20  Charise Killian, MD  levothyroxine (SYNTHROID) 50 MCG tablet Take 1 tablet (50 mcg total) by mouth daily. 09/05/20   Dionne Bucy, MD  liraglutide (VICTOZA) 18 MG/3ML SOPN Inject 1.2 mg into the skin in the morning. 09/05/20 10/05/20  Dionne Bucy, MD  losartan (COZAAR) 100 MG tablet Take 1 tablet (100 mg total) by mouth daily. Patient not taking: Reported on 10/21/2020 09/05/20   Dionne Bucy, MD  metFORMIN (GLUCOPHAGE) 1000 MG tablet Take 1 tablet (1,000 mg total) by mouth 2 (two) times daily. Patient not taking: Reported on 10/21/2020 09/05/20   Dionne Bucy, MD  mirtazapine (REMERON) 15 MG tablet Take 0.5 tablets (7.5 mg total) by mouth daily. Patient not taking: Reported on 10/21/2020 09/05/20   Dionne Bucy, MD  oxyCODONE-acetaminophen (PERCOCET) 7.5-325 MG tablet Take 1 tablet by mouth 2 (two) times daily as needed. Patient not taking: Reported on 10/21/2020 08/15/20   [provider]    Inpatient Medications: Scheduled Meds:  amLODipine  10 mg Oral Daily   aspirin EC  81 mg  Oral Daily   atorvastatin  40 mg Oral QPM   citalopram  20 mg Oral Daily   enoxaparin (LOVENOX) injection  40 mg Subcutaneous Q24H   hydrochlorothiazide  25 mg Oral Daily   insulin aspart  0-15 Units Subcutaneous TID WC   levothyroxine  50 mcg Oral Q0600   losartan  100 mg Oral Daily   mirtazapine  7.5 mg Oral Daily   Continuous Infusions:  sodium chloride 100 mL/hr at 10/21/20 1306   PRN Meds: acetaminophen, ondansetron (ZOFRAN) IV  Allergies:     Allergies  Allergen Reactions   Ace Inhibitors Cough    cough    Social History:   Social History   Socioeconomic History   Marital status: Married    Spouse name: Not on file   Number of children: Not on file   Years of education: Not on file   Highest education level: Not on file  Occupational History   Not on file  Tobacco Use   Smoking status: Never   Smokeless tobacco: Never  Substance and Sexual Activity   Alcohol use: No   Drug use: Yes    Comment: crack   Sexual activity: Not on file  Other Topics Concern   Not on file  Social History Narrative   Not on file   Social Determinants of Health   Financial Resource Strain: Not on file  Food Insecurity: Not on file  Transportation Needs: Not on file  Physical Activity: Not on file  Stress: Not on file  Social Connections: Not on file  Intimate Partner Violence: Not on file    Family History:    Family History  Problem Relation Age of Onset   Heart attack Mother      ROS:  Please see the history of present illness.   All other ROS reviewed and negative.     Physical Exam/Data:   Vitals:   10/21/20 0929 10/21/20 1000 10/21/20 1100 10/21/20 1330  BP:  140/86 (!) 162/93 (!) 154/96  Pulse:  77 77 77  Resp:  10 13 16   Temp:      TempSrc:      SpO2:  99% 97% 98%  Weight: 79.4 kg     Height: 6\' 2"  (1.88 m)       Intake/Output Summary (Last 24 hours) at 10/21/2020 1438 Last data filed at 10/21/2020 1236 Gross per 24 hour  Intake 1000 ml  Output --  Net 1000 ml   Last 3 Weights 10/21/2020 09/02/2020 08/05/2020  Weight (lbs) 175 lb 190 lb 185 lb  Weight (kg) 79.379 kg 86.183 kg 83.915 kg     Body mass index is 22.47 kg/m.  General:  Well nourished, well developed, in no acute distress HEENT: normal Lymph: no adenopathy Neck: no JVD Endocrine:  No thryomegaly Vascular: No carotid bruits; FA pulses 2+ bilaterally without bruits  Cardiac:  normal S1, S2; RRR; no murmur  Lungs:  clear to auscultation  bilaterally, no wheezing, rhonchi or rales  Abd: soft, nontender, no hepatomegaly  Ext: no edema Musculoskeletal:  No deformities, BUE and BLE strength normal and equal Skin: warm and dry  Neuro:  CNs 2-12 intact, no focal abnormalities noted Psych:  Normal affect   EKG:  The EKG was personally reviewed and demonstrates: Normal sinus rhythm Telemetry:  Telemetry was personally reviewed and demonstrates: Normal sinus rhythm  Relevant CV Studies: left heart cath 2015 showed mid left circumflex total occlusion, mid RCA in-stent 50% stenosis.  OM1 60%  stenosed.  Echocardiogram 07/2020 EF 55 to 60%, impaired relaxation.  Laboratory Data:  High Sensitivity Troponin:   Recent Labs  Lab 10/21/20 1005 10/21/20 1219  TROPONINIHS 27* 24*     Chemistry Recent Labs  Lab 10/21/20 1005  NA 130*  K 4.3  CL 94*  CO2 23  GLUCOSE 428*  BUN 33*  CREATININE 1.57*  CALCIUM 9.6  GFRNONAA 48*  ANIONGAP 13    No results for input(s): PROT, ALBUMIN, AST, ALT, ALKPHOS, BILITOT in the last 168 hours. Hematology Recent Labs  Lab 10/21/20 1005  WBC 7.8  RBC 4.28  HGB 13.1  HCT 36.2*  MCV 84.6  MCH 30.6  MCHC 36.2*  RDW 12.3  PLT 237   BNPNo results for input(s): BNP, PROBNP in the last 168 hours.  DDimer  Recent Labs  Lab 10/21/20 1219  DDIMER 0.57*     Radiology/Studies:  DG Chest 2 View  Result Date: 10/21/2020 CLINICAL DATA:  Chest pain EXAM: CHEST - 2 VIEW COMPARISON:  None. FINDINGS: The heart size and mediastinal contours are within normal limits. Both lungs are clear. The visualized skeletal structures are unremarkable. IMPRESSION: No active cardiopulmonary disease. Electronically Signed   By: Charlett Nose M.D.   On: 10/21/2020 09:49   CT Head Wo Contrast  Result Date: 10/21/2020 CLINICAL DATA:  Head trauma, minor. Intermittent pressure of the chest and shortness of breath. EXAM: CT HEAD WITHOUT CONTRAST TECHNIQUE: Contiguous axial images were obtained from the base of  the skull through the vertex without intravenous contrast. COMPARISON:  09/03/2020 FINDINGS: Brain: Mild age related atrophy. No sign of old or acute focal infarction, mass lesion, hemorrhage, hydrocephalus or extra-axial collection. Vascular: There is atherosclerotic calcification of the major vessels at the base of the brain. Skull: Negative Sinuses/Orbits: Clear/normal Other: None IMPRESSION: No acute or traumatic finding. Mild age related volume loss. Atherosclerotic calcification of the major vessels at the base of the brain, common at this age. Electronically Signed   By: Paulina Fusi M.D.   On: 10/21/2020 11:00   CT Cervical Spine Wo Contrast  Result Date: 10/21/2020 CLINICAL DATA:  Neck trauma EXAM: CT CERVICAL SPINE WITHOUT CONTRAST TECHNIQUE: Multidetector CT imaging of the cervical spine was performed without intravenous contrast. Multiplanar CT image reconstructions were also generated. COMPARISON:  08/06/2020 FINDINGS: Alignment: Reversal of cervical lordosis.  No listhesis Skull base and vertebrae: No acute fracture. Soft tissues and spinal canal: No prevertebral fluid or swelling. No visible canal hematoma. Disc levels: Notable left-sided facet osteoarthritis at C2-3 and C3-4. Generalized disc space narrowing that is mild for age Upper chest: Negative IMPRESSION: No evidence of cervical spine injury. Electronically Signed   By: Marnee Spring M.D.   On: 10/21/2020 11:01     Assessment and Plan:   Chest pain, history of CAD/PCI to mid RCA -Troponins not consistent with ACS -Recent echo 07/2020 with EF 55 to 60%. -Obtain UDS due to drug use/cocaine history -Start Imdur for antianginal benefit and possible vasospasm -PTA aspirin 81, Lipitor. -Plan for [facility] tomorrow.  N.p.o. after midnight.  2.  Hypertension -Imdur as above -Start Coreg, stop HCTZ creatinine abnormal.,  Continue amlodipine.  3.  History of cocaine use -Obtain UDS -Cessation recommended  Total encounter  time 110 minutes  Greater than 50% was spent in counseling and coordination of care with the patient   Signed, Debbe Odea, MD  10/21/2020 2:38 PM

## 2020-10-21 NOTE — ED Provider Notes (Signed)
Couple episode  [facility] Emergency Department Provider Note   ____________________________________________   Event Date/Time   First MD Initiated Contact with Patient 10/21/20 [phone]     (approximate)  I have reviewed the triage vital signs and the nursing notes.   HISTORY  Chief Complaint Chest Pain    HPI Steve Alvarado is a 65 y.o. male with past medical history of hypertension, hyperlipidemia, diabetes, CAD, hypothyroidism, and cocaine abuse who presents to the ED complaining of chest pain.  Patient reports that he has been dealing with intermittent pain in his chest for the past 2 days.  He describes it as a tightness and pressure, that is not exacerbated or alleviated by anything in particular.  Pain is associated with a mild shortness of breath, but he denies any fevers or cough.  He does state that he lost consciousness earlier today, is not sure exactly what happened but is concerned that he hit his head.  He states that he has fallen multiple times recently, denies any pain in his extremities.  He denies alcohol use, but does admit to regular crack cocaine use, states he smoked crack as recently as yesterday.        Past Medical History:  Diagnosis Date   Coronary artery disease    Diabetes mellitus    Type II   Hyperlipidemia    Hypertension    Thyroid disease     Patient Active Problem List   Diagnosis Date Noted   Cocaine abuse (HCC) 09/05/2020   Adjustment disorder with emotional disturbance 09/05/2020   Hypertensive urgency 03/08/2015   HTN (hypertension) 02/14/2011   CAD (coronary artery disease) 08/13/2010   Stented coronary artery 08/13/2010   Diabetes mellitus (HCC) 08/13/2010    Past Surgical History:  Procedure Laterality Date   CARDIAC CATHETERIZATION  08/06/2010   Bare metal stent placed in RCA.   HAND SURGERY     left    Prior to Admission medications   Medication Sig Start Date End Date Taking? Authorizing  Provider  amLODipine (NORVASC) 10 MG tablet Take 1 tablet (10 mg total) by mouth daily. 09/05/20   Dionne Bucy, MD  atorvastatin (LIPITOR) 40 MG tablet Take 1 tablet (40 mg total) by mouth every evening. 09/05/20 10/05/20  Dionne Bucy, MD  citalopram (CELEXA) 20 MG tablet Take 1 tablet (20 mg total) by mouth daily. 09/05/20 10/05/20  Dionne Bucy, MD  gabapentin (NEURONTIN) 300 MG capsule Take 3 capsules (900 mg total) by mouth 2 (two) times daily. 09/05/20   Dionne Bucy, MD  hydrochlorothiazide (HYDRODIURIL) 25 MG tablet Take 1 tablet (25 mg total) by mouth daily. 08/10/20 09/09/20  Charise Killian, MD  levothyroxine (SYNTHROID) 50 MCG tablet Take 1 tablet (50 mcg total) by mouth daily. 09/05/20   Dionne Bucy, MD  liraglutide (VICTOZA) 18 MG/3ML SOPN Inject 1.2 mg into the skin in the morning. 09/05/20 10/05/20  Dionne Bucy, MD  losartan (COZAAR) 100 MG tablet Take 1 tablet (100 mg total) by mouth daily. 09/05/20   Dionne Bucy, MD  metFORMIN (GLUCOPHAGE) 1000 MG tablet Take 1 tablet (1,000 mg total) by mouth 2 (two) times daily. 09/05/20   Dionne Bucy, MD  mirtazapine (REMERON) 15 MG tablet Take 0.5 tablets (7.5 mg total) by mouth daily. 09/05/20   Dionne Bucy, MD  oxyCODONE-acetaminophen (PERCOCET) 7.5-325 MG tablet Take 1 tablet by mouth 2 (two) times daily as needed. 08/15/20   [provider]    Allergies Ace inhibitors  Family  History  Problem Relation Age of Onset   Heart attack Mother     Social History Social History   Tobacco Use   Smoking status: Never   Smokeless tobacco: Never  Substance Use Topics   Alcohol use: No   Drug use: Yes    Comment: crack    Review of Systems  Constitutional: No fever/chills.  Positive for multiple falls. Eyes: No visual changes. ENT: No sore throat. Cardiovascular: Positive for syncope and chest pain. Respiratory: Positive for shortness of breath. Gastrointestinal:  No abdominal pain.  No nausea, no vomiting.  No diarrhea.  No constipation. Genitourinary: Negative for dysuria. Musculoskeletal: Negative for back pain. Skin: Negative for rash. Neurological: Negative for headaches, focal weakness or numbness.  ____________________________________________   PHYSICAL EXAM:  VITAL SIGNS: ED Triage Vitals  Enc Vitals Group     BP      Pulse      Resp      Temp      Temp src      SpO2      Weight      Height      Head Circumference      Peak Flow      Pain Score      Pain Loc      Pain Edu?      Excl. in GC?     Constitutional: Alert and oriented. Eyes: Conjunctivae are normal. Head: Atraumatic. Nose: No congestion/rhinnorhea. Mouth/Throat: Mucous membranes are moist. Neck: Normal ROM, no midline cervical spine tenderness to palpation. Cardiovascular: Normal rate, regular rhythm. Grossly normal heart sounds.  2+ radial pulses bilaterally. Respiratory: Normal respiratory effort.  No retractions. Lungs CTAB. Gastrointestinal: Soft and nontender. No distention. Genitourinary: deferred Musculoskeletal: No lower extremity tenderness nor edema.  No upper extremity bony tenderness to palpation. Neurologic:  Normal speech and language. No gross focal neurologic deficits are appreciated. Skin:  Skin is warm, dry and intact. No rash noted. Psychiatric: Mood and affect are normal. Speech and behavior are normal.  ____________________________________________   LABS (all labs ordered are listed, but only abnormal results are displayed)  Labs Reviewed  CBC WITH DIFFERENTIAL/PLATELET - Abnormal; Notable for the following components:      Result Value   HCT 36.2 (*)    MCHC 36.2 (*)    All other components within normal limits  BASIC METABOLIC PANEL - Abnormal; Notable for the following components:   Sodium 130 (*)    Chloride 94 (*)    Glucose, Bld 428 (*)    BUN 33 (*)    Creatinine, Ser 1.57 (*)    GFR, Estimated 48 (*)    All other  components within normal limits  TROPONIN I (HIGH SENSITIVITY) - Abnormal; Notable for the following components:   Troponin I (High Sensitivity) 27 (*)    All other components within normal limits  RESP PANEL BY RT-PCR (FLU A&B, COVID) [id number]  TROPONIN I (HIGH SENSITIVITY)   ____________________________________________  EKG  ED ECG REPORT I, Chesley Noon, the attending physician, personally viewed and interpreted this ECG.   Date: 10/21/2020  EKG Time: 9:22  Rate: 79  Rhythm: normal sinus rhythm, occasional PVC noted, unifocal  Axis: Normal  Intervals:none  ST&T Change: Nonspecific T wave changes laterally    PROCEDURES  Procedure(s) performed (including Critical Care):  Procedures   ____________________________________________   INITIAL IMPRESSION / ASSESSMENT AND PLAN / ED COURSE      65 year old male with past medical history of hypertension, hyperlipidemia, diabetes,  CAD, hypothyroidism, and cocaine use who presents to the ED with today along with chest pain and shortness of breath.  Patient is not in any respiratory distress and is maintaining O2 sats on room air.  EKG shows no evidence of arrhythmia or ischemia.  Patient does report hitting his head, CT head and cervical spine were performed but negative for acute process.  Chest x-ray reviewed by me and shows no infiltrate, edema, or effusion.  Labs are reassuring, troponin is stable compared to previous.  Due to patient's syncope, chest pain, and high risk status, case discussed with hospitalist for observation.      ____________________________________________   FINAL CLINICAL IMPRESSION(S) / ED DIAGNOSES  Final diagnoses:  Syncope, unspecified syncope type  Chest pain, unspecified type     ED Discharge Orders     None        Note:  This document was prepared using Dragon voice recognition software and may include unintentional dictation errors.    Chesley Noon, MD 10/21/20 1118

## 2020-10-21 NOTE — ED Triage Notes (Signed)
Pt BIB ems c/o intermittent 7/10 pressure to chest and SOB x 2 days. Pt reports h/o stroke and heart attack with 2 stents placed. . Pt admits to using "crack" yesterday and reports being homeless.

## 2020-10-21 NOTE — H&P (Addendum)
History and Physical    Steve Alvarado ZWC:[id number] DOB: 1955-04-19 DOA: 10/21/2020  PCP: Ellery Plunk, MD   Patient coming from: Home  I have personally briefly reviewed patient's old medical records in [facility]  Chief Complaint: Chest pain  HPI: Steve Alvarado is a 65 y.o. male with medical history significant for diabetes mellitus, hypertension, hypothyroidism, chronic cocaine use who presents to the ER for evaluation of a 2-day history of midsternal chest pain which he rates a 7 x 10 in intensity at its worst.  He describes it as pressure-like, it is nonradiating and has been associated with multiple syncopal episodes.  Patient states that he hit his head during one of his syncopal episodes and has a skin tear on his left elbow from one of his falls. He denies having any nausea, no vomiting, no shortness of breath, no fever, no chills, no diaphoresis or palpitations. Denies having any cough or blurred vision, no diarrhea, no urinary symptoms, no focal deficits. Labs show sodium 130, potassium 4.3, chloride 94 bicarb 23, glucose 428, BUN 33, creatinine 1.57, calcium 9.6, troponin 27, white count 7.8, hemoglobin 13.1, hematocrit 36.2, MCV 84.6, RDW 12.3, platelet count 237 Respiratory viral panel is negative. Chest x-ray reviewed by me shows no evidence of acute cardiopulmonary disease Cervical spine CT shows no evidence of cervical spine injury CT scan of the head without contrast shows no acute findings. Twelve-lead EKG reviewed by me shows sinus rhythm, PVCs with nonspecific T wave changes.  ED Course: Patient is a 65 year old African-American male who presents to the ER by EMS for evaluation of chest pain which is mostly midsternal, intermittent, nonradiating and rated 7 x 10 in intensity at its worst.  Chest pain is associated with multiple syncopal episodes. His initial troponin was 27 and patient has hyperglycemia. Twelve-lead EKG does not show any acute  findings. He will be admitted to the hospital for further evaluation.   Review of Systems: As per HPI otherwise all other systems reviewed and negative.  Problem   Past Medical History:  Diagnosis Date   Coronary artery disease    Diabetes mellitus    Type II   Hyperlipidemia    Hypertension    Thyroid disease     Past Surgical History:  Procedure Laterality Date   CARDIAC CATHETERIZATION  08/06/2010   Bare metal stent placed in RCA.   HAND SURGERY     left     reports that he has never smoked. He has never used smokeless tobacco. He reports current drug use. He reports that he does not drink alcohol.  Allergies  Allergen Reactions   Ace Inhibitors Cough    cough    Family History  Problem Relation Age of Onset   Heart attack Mother       Prior to Admission medications   Medication Sig Start Date End Date Taking? Authorizing Provider  amLODipine (NORVASC) 10 MG tablet Take 1 tablet (10 mg total) by mouth daily. Patient not taking: Reported on 10/21/2020 09/05/20   Dionne Bucy, MD  atorvastatin (LIPITOR) 40 MG tablet Take 1 tablet (40 mg total) by mouth every evening. 09/05/20 10/05/20  Dionne Bucy, MD  citalopram (CELEXA) 20 MG tablet Take 1 tablet (20 mg total) by mouth daily. 09/05/20 10/05/20  Dionne Bucy, MD  gabapentin (NEURONTIN) 300 MG capsule Take 3 capsules (900 mg total) by mouth 2 (two) times daily. Patient not taking: Reported on 10/21/2020 09/05/20   Dionne Bucy, MD  hydrochlorothiazide (HYDRODIURIL) 25 MG tablet Take 1 tablet (25 mg total) by mouth daily. 08/10/20 09/09/20  Charise Killian, MD  levothyroxine (SYNTHROID) 50 MCG tablet Take 1 tablet (50 mcg total) by mouth daily. 09/05/20   Dionne Bucy, MD  liraglutide (VICTOZA) 18 MG/3ML SOPN Inject 1.2 mg into the skin in the morning. 09/05/20 10/05/20  Dionne Bucy, MD  losartan (COZAAR) 100 MG tablet Take 1 tablet (100 mg total) by mouth daily. Patient not taking:  Reported on 10/21/2020 09/05/20   Dionne Bucy, MD  metFORMIN (GLUCOPHAGE) 1000 MG tablet Take 1 tablet (1,000 mg total) by mouth 2 (two) times daily. Patient not taking: Reported on 10/21/2020 09/05/20   Dionne Bucy, MD  mirtazapine (REMERON) 15 MG tablet Take 0.5 tablets (7.5 mg total) by mouth daily. Patient not taking: Reported on 10/21/2020 09/05/20   Dionne Bucy, MD  oxyCODONE-acetaminophen (PERCOCET) 7.5-325 MG tablet Take 1 tablet by mouth 2 (two) times daily as needed. Patient not taking: Reported on 10/21/2020 08/15/20   [provider]    Physical Exam: Vitals:   10/21/20 0928 10/21/20 0929 10/21/20 1000 10/21/20 1100  BP: (!) 151/77  140/86 (!) 162/93  Pulse: 78  77 77  Resp: 16  10 13   Temp: 98.6 F (37 C)     TempSrc: Oral     SpO2: 100%  99% 97%  Weight:  79.4 kg    Height:  6\' 2"  (1.88 m)       Vitals:   10/21/20 0928 10/21/20 0929 10/21/20 1000 10/21/20 1100  BP: (!) 151/77  140/86 (!) 162/93  Pulse: 78  77 77  Resp: 16  10 13   Temp: 98.6 F (37 C)     TempSrc: Oral     SpO2: 100%  99% 97%  Weight:  79.4 kg    Height:  6\' 2"  (1.88 m)        Constitutional: Alert and oriented x 3 . Not in any apparent distress HEENT:      Head: Normocephalic and atraumatic.         Eyes: PERLA, EOMI, Conjunctivae are normal. Sclera is non-icteric.       Mouth/Throat: Mucous membranes are moist.       Neck: Supple with no signs of meningismus. Cardiovascular: Regular rate and rhythm. No murmurs, gallops, or rubs. 2+ symmetrical distal pulses are present . No JVD. No LE edema Respiratory: Respiratory effort normal .Lungs sounds clear bilaterally. No wheezes, crackles, or rhonchi.  Gastrointestinal: Soft, non tender, and non distended with positive bowel sounds.  Genitourinary: No CVA tenderness. Musculoskeletal: Nontender with normal range of motion in all extremities. No cyanosis, or erythema of extremities. Neurologic:  Face is symmetric. Moving  all extremities. No gross focal neurologic deficits . Skin: Skin is warm, dry.  No rash or ulcers Psychiatric: Mood and affect are normal    Labs on Admission: I have personally reviewed following labs and imaging studies  CBC: Recent Labs  Lab 10/21/20 1005  WBC 7.8  NEUTROABS 4.9  HGB 13.1  HCT 36.2*  MCV 84.6  PLT 237   Basic Metabolic Panel: Recent Labs  Lab 10/21/20 1005  NA 130*  K 4.3  CL 94*  CO2 23  GLUCOSE 428*  BUN 33*  CREATININE 1.57*  CALCIUM 9.6   GFR: Estimated Creatinine Clearance: 52 mL/min (A) (by C-G formula based on SCr of 1.57 mg/dL (H)). Liver Function Tests: No results for input(s): AST, ALT, ALKPHOS, BILITOT, PROT, ALBUMIN in the last  168 hours. No results for input(s): LIPASE, AMYLASE in the last 168 hours. No results for input(s): AMMONIA in the last 168 hours. Coagulation Profile: No results for input(s): INR, PROTIME in the last 168 hours. Cardiac Enzymes: No results for input(s): CKTOTAL, CKMB, CKMBINDEX, TROPONINI in the last 168 hours. BNP (last 3 results) No results for input(s): PROBNP in the last 8760 hours. HbA1C: No results for input(s): HGBA1C in the last 72 hours. CBG: No results for input(s): GLUCAP in the last 168 hours. Lipid Profile: No results for input(s): CHOL, HDL, LDLCALC, TRIG, CHOLHDL, LDLDIRECT in the last 72 hours. Thyroid Function Tests: No results for input(s): TSH, T4TOTAL, FREET4, T3FREE, THYROIDAB in the last 72 hours. Anemia Panel: No results for input(s): VITAMINB12, FOLATE, FERRITIN, TIBC, IRON, RETICCTPCT in the last 72 hours. Urine analysis:    Component Value Date/Time   COLORURINE STRAW (A) 08/06/2020 0246   APPEARANCEUR CLEAR (A) 08/06/2020 0246   APPEARANCEUR Clear 08/22/2013 0057   LABSPEC 1.025 08/06/2020 0246   LABSPEC 1.017 08/22/2013 0057   PHURINE 5.0 08/06/2020 0246   GLUCOSEU >=500 (A) 08/06/2020 0246   GLUCOSEU >=500 08/22/2013 0057   HGBUR SMALL (A) 08/06/2020 0246    BILIRUBINUR NEGATIVE 08/06/2020 0246   BILIRUBINUR Negative 08/22/2013 0057   KETONESUR 5 (A) 08/06/2020 0246   PROTEINUR 30 (A) 08/06/2020 0246   NITRITE NEGATIVE 08/06/2020 0246   LEUKOCYTESUR NEGATIVE 08/06/2020 0246   LEUKOCYTESUR Negative 08/22/2013 0057    Radiological Exams on Admission: DG Chest 2 View  Result Date: 10/21/2020 CLINICAL DATA:  Chest pain EXAM: CHEST - 2 VIEW COMPARISON:  None. FINDINGS: The heart size and mediastinal contours are within normal limits. Both lungs are clear. The visualized skeletal structures are unremarkable. IMPRESSION: No active cardiopulmonary disease. Electronically Signed   By: Charlett NoseKevin  Dover M.D.   On: 10/21/2020 09:49   CT Head Wo Contrast  Result Date: 10/21/2020 CLINICAL DATA:  Head trauma, minor. Intermittent pressure of the chest and shortness of breath. EXAM: CT HEAD WITHOUT CONTRAST TECHNIQUE: Contiguous axial images were obtained from the base of the skull through the vertex without intravenous contrast. COMPARISON:  09/03/2020 FINDINGS: Brain: Mild age related atrophy. No sign of old or acute focal infarction, mass lesion, hemorrhage, hydrocephalus or extra-axial collection. Vascular: There is atherosclerotic calcification of the major vessels at the base of the brain. Skull: Negative Sinuses/Orbits: Clear/normal Other: None IMPRESSION: No acute or traumatic finding. Mild age related volume loss. Atherosclerotic calcification of the major vessels at the base of the brain, common at this age. Electronically Signed   By: Paulina FusiMark  Shogry M.D.   On: 10/21/2020 11:00   CT Cervical Spine Wo Contrast  Result Date: 10/21/2020 CLINICAL DATA:  Neck trauma EXAM: CT CERVICAL SPINE WITHOUT CONTRAST TECHNIQUE: Multidetector CT imaging of the cervical spine was performed without intravenous contrast. Multiplanar CT image reconstructions were also generated. COMPARISON:  08/06/2020 FINDINGS: Alignment: Reversal of cervical lordosis.  No listhesis Skull base and  vertebrae: No acute fracture. Soft tissues and spinal canal: No prevertebral fluid or swelling. No visible canal hematoma. Disc levels: Notable left-sided facet osteoarthritis at C2-3 and C3-4. Generalized disc space narrowing that is mild for age Upper chest: Negative IMPRESSION: No evidence of cervical spine injury. Electronically Signed   By: Marnee SpringJonathon  Watts M.D.   On: 10/21/2020 11:01     Assessment/Plan Principal Problem:   Chest pain Active Problems:   CAD (coronary artery disease)   Diabetes mellitus (HCC)   Essential hypertension  Cocaine abuse (HCC)   Depression   Non compliance w medication regimen     Chest pain  In a patient with a known history of coronary artery disease as well as poorly controlled diabetes mellitus Obtain serial cardiac enzymes Patient had a recent 2D echocardiogram which showed normal LVEF without any evidence of regional wall motion abnormality Morphine for pain control Place patient on aspirin and continue high intensity statins Patient not on beta-blocker due to cocaine abuse     Diabetes mellitus with hyperglycemia and chronic kidney disease stage III Uncontrolled due to medication noncompliance Obtain hemoglobin A1c level Glycemic control with sliding scale insulin    Hypertension Controlled due to medication noncompliance Continue amlodipine, hydrochlorothiazide and Cozaar     Depression Continue Remeron and citalopram    Hyponatremia Secondary to hyperglycemia Expect improvement in serum sodium levels with normalization of patient's glucose levels.    Hypothyroidism Continue Synthroid  DVT prophylaxis: Lovenox  Code Status: full code  Family Communication: Greater than 50% of time was spent discussing patient's condition and plan of care with him at the bedside.  All questions and concerns have been addressed.  He verbalizes understanding and agrees with the plan. Disposition Plan: Back to previous home  environment Consults called: Cardiology  Status: Observation    Elayah Klooster MD [facility]     10/21/2020, 12:40 PM

## 2020-10-22 ENCOUNTER — Observation Stay: Admit: 2020-10-22 | Payer: Medicare Other

## 2020-10-22 ENCOUNTER — Observation Stay (HOSPITAL_BASED_OUTPATIENT_CLINIC_OR_DEPARTMENT_OTHER)
Admit: 2020-10-22 | Discharge: 2020-10-22 | Disposition: A | Discharge status: Home / Self Care | Payer: Medicare Other | Attending: Physician Assistant | Admitting: Physician Assistant

## 2020-10-22 ENCOUNTER — Observation Stay (HOSPITAL_BASED_OUTPATIENT_CLINIC_OR_DEPARTMENT_OTHER): Payer: Medicare Other

## 2020-10-22 DIAGNOSIS — R55 Syncope and collapse: Secondary | ICD-10-CM

## 2020-10-22 DIAGNOSIS — R072 Precordial pain: Secondary | ICD-10-CM | POA: Diagnosis not present

## 2020-10-22 DIAGNOSIS — F32A Depression, unspecified: Secondary | ICD-10-CM

## 2020-10-22 DIAGNOSIS — I25118 Atherosclerotic heart disease of native coronary artery with other forms of angina pectoris: Secondary | ICD-10-CM | POA: Diagnosis not present

## 2020-10-22 DIAGNOSIS — Z794 Long term (current) use of insulin: Secondary | ICD-10-CM

## 2020-10-22 DIAGNOSIS — F1410 Cocaine abuse, uncomplicated: Secondary | ICD-10-CM | POA: Diagnosis not present

## 2020-10-22 DIAGNOSIS — E1159 Type 2 diabetes mellitus with other circulatory complications: Secondary | ICD-10-CM

## 2020-10-22 DIAGNOSIS — Z9114 Patient's other noncompliance with medication regimen: Secondary | ICD-10-CM

## 2020-10-22 LAB — ECHOCARDIOGRAM COMPLETE
AR max vel: 2.79 cm2
AV Area VTI: 2.96 cm2
AV Area mean vel: 2.84 cm2
AV Mean grad: 3 mmHg
AV Peak grad: 5 mmHg
Ao pk vel: 1.12 m/s
Area-P 1/2: 2.74 cm2
Height: 74 in
S' Lateral: 2.66 cm
Weight: 2800 oz

## 2020-10-22 LAB — NM MYOCAR MULTI W/SPECT W/WALL MOTION / EF
LV dias vol: 92 mL (ref 62–150)
LV sys vol: 50 mL
Peak HR: 76 {beats}/min
Percent HR: 49 %
Rest HR: 66 {beats}/min
SDS: 4
SRS: 3
SSS: 1
TID: 1.25

## 2020-10-22 LAB — LIPID PANEL
Cholesterol: 228 mg/dL — ABNORMAL HIGH (ref 0–200)
HDL: 94 mg/dL (ref >40)
LDL Cholesterol: 112 mg/dL — ABNORMAL HIGH (ref 0–99)
Total CHOL/HDL Ratio: 2.4 RATIO
Triglycerides: 112 mg/dL (ref <150)
VLDL: 22 mg/dL (ref 0–40)

## 2020-10-22 LAB — CBG MONITORING, ED
Glucose-Capillary: 292 mg/dL — ABNORMAL HIGH (ref 70–99)
Glucose-Capillary: 316 mg/dL — ABNORMAL HIGH (ref 70–99)

## 2020-10-22 LAB — GLUCOSE, CAPILLARY
Glucose-Capillary: 235 mg/dL — ABNORMAL HIGH (ref 70–99)
Glucose-Capillary: 171 mg/dL — ABNORMAL HIGH (ref 70–99)

## 2020-10-22 MED ORDER — INSULIN GLARGINE-YFGN 100 UNIT/ML ~~LOC~~ SOLN
30.0000 [IU] | Freq: Two times a day (BID) | SUBCUTANEOUS | Status: DC
  Administered 2020-10-22 – 2020-10-23 (×3): 30 [IU] via SUBCUTANEOUS
  Filled 2020-10-22 (×6): qty 0.3

## 2020-10-22 MED ORDER — OXYCODONE HCL 5 MG PO TABS
7.5000 mg | ORAL_TABLET | Freq: Four times a day (QID) | ORAL | Status: DC | PRN
  Administered 2020-10-22 – 2020-10-23 (×3): 7.5 mg via ORAL
  Filled 2020-10-22 (×3): qty 2

## 2020-10-22 MED ORDER — TECHNETIUM TC 99M TETROFOSMIN IV KIT
32.1000 | PACK | Freq: Once | INTRAVENOUS | Status: AC | PRN
  Administered 2020-10-22: 32.1 via INTRAVENOUS
  Filled 2020-10-22: qty 33

## 2020-10-22 MED ORDER — TECHNETIUM TC 99M TETROFOSMIN IV KIT
10.0000 | PACK | Freq: Once | INTRAVENOUS | Status: AC | PRN
  Administered 2020-10-22: 10.8 via INTRAVENOUS
  Filled 2020-10-22: qty 10

## 2020-10-22 MED ORDER — REGADENOSON 0.4 MG/5ML IV SOLN
0.4000 mg | Freq: Once | INTRAVENOUS | Status: AC
  Administered 2020-10-22: 0.4 mg via INTRAVENOUS

## 2020-10-22 NOTE — Progress Notes (Signed)
Progress Note  Patient Name: Steve Alvarado Date of Encounter: 10/22/2020  Primary Cardiologist: Mariah Milling  Subjective   No further chest pain, dizziness, presyncope, or syncope. He is for [facility] MPI today.   Inpatient Medications    Scheduled Meds:  amLODipine  10 mg Oral Daily   aspirin EC  81 mg Oral Daily   atorvastatin  40 mg Oral QPM   carvedilol  6.25 mg Oral BID WC   citalopram  20 mg Oral Daily   enoxaparin (LOVENOX) injection  40 mg Subcutaneous Q24H   insulin aspart  0-15 Units Subcutaneous TID WC   isosorbide mononitrate  30 mg Oral Daily   levothyroxine  50 mcg Oral Q0600   losartan  100 mg Oral Daily   mirtazapine  7.5 mg Oral Daily   Continuous Infusions:  PRN Meds: acetaminophen, ondansetron (ZOFRAN) IV   Vital Signs    Vitals:   10/21/20 2259 10/21/20 2337 10/22/20 0252 10/22/20 0655  BP:  117/67 113/63 (!) 155/88  Pulse:  73 67 69  Resp:   12   Temp: 98.7 F (37.1 C)     TempSrc:      SpO2:  100% 100% 99%  Weight:      Height:        Intake/Output Summary (Last 24 hours) at 10/22/2020 0725 Last data filed at 10/21/2020 1236 Gross per 24 hour  Intake 1000 ml  Output --  Net 1000 ml   Filed Weights   10/21/20 0929  Weight: 79.4 kg    Telemetry    SR with PACs and PVCs, no significant arrhythmias, prolonged pauses, or evidence of high-grade AV block - Personally Reviewed  ECG    No new tracings - Personally Reviewed  Physical Exam   GEN: No acute distress.   Neck: No JVD. Cardiac: RRR, no murmurs, rubs, or gallops.  Respiratory: Clear to auscultation bilaterally.  GI: Soft, nontender, non-distended.   MS: No edema; No deformity. Neuro:  Alert and oriented x 3; Nonfocal.  Psych: Normal affect.  Labs    Chemistry Recent Labs  Lab 10/21/20 1005  NA 130*  K 4.3  CL 94*  CO2 23  GLUCOSE 428*  BUN 33*  CREATININE 1.57*  CALCIUM 9.6  GFRNONAA 48*  ANIONGAP 13     Hematology Recent Labs  Lab 10/21/20 1005  WBC  7.8  RBC 4.28  HGB 13.1  HCT 36.2*  MCV 84.6  MCH 30.6  MCHC 36.2*  RDW 12.3  PLT 237    Cardiac EnzymesNo results for input(s): TROPONINI in the last 168 hours. No results for input(s): TROPIPOC in the last 168 hours.   BNPNo results for input(s): BNP, PROBNP in the last 168 hours.   DDimer  Recent Labs  Lab 10/21/20 1219  DDIMER 0.57*     Radiology    DG Chest 2 View  Result Date: 10/21/2020 IMPRESSION: No active cardiopulmonary disease. Electronically Signed   By: Charlett Nose M.D.   On: 10/21/2020 09:49   CT Head Wo Contrast  Result Date: 10/21/2020 IMPRESSION: No acute or traumatic finding. Mild age related volume loss. Atherosclerotic calcification of the major vessels at the base of the brain, common at this age. Electronically Signed   By: Paulina Fusi M.D.   On: 10/21/2020 11:00   CT Cervical Spine Wo Contrast  Result Date: 10/21/2020 IMPRESSION: No evidence of cervical spine injury. Electronically Signed   By: Marnee Spring M.D.   On: 10/21/2020  11:01    Cardiac Studies   2D echo 08/07/2020: 1. Left ventricular ejection fraction, by estimation, is 55 to 60%. The  left ventricle has normal function. The left ventricle has no regional  wall motion abnormalities. Left ventricular diastolic parameters were  normal.   2. Right ventricular systolic function is normal. The right ventricular  size is normal.   3. Left atrial size was mild to moderately dilated.   4. Right atrial size was mild to moderately dilated.   5. The mitral valve is normal in structure. No evidence of mitral valve  regurgitation.   6. The aortic valve is normal in structure. Aortic valve regurgitation is  not visualized.  __________  LHC 06/2013: Mid LAD 30%, distal LAD 20%, mid LCx 100%, OM1 60%, proximal RCA mid RCA stent 50% ISR, 50%, distal RCA 30%, rPDA 90%, EF 55%. Aggressive medical therapy and cocaine cessation was recommended.   Patient Profile     65 y.o. male with history of  CAD s/p PCI to the RCA, cocaine use, poorly controlled DM2, HTN, and hypothyroidism admitted with chest pain and syncope in the context of ongoing cocaine use.   Assessment & Plan    1. CAD involving the native coronary arteries s/p PCI to the mid RCA with mildly elevated troponin and chest pain: -Minimal troponin elevation is not consistent with ACS -Cannot exclude coronary vasospasm with ongoing cocaine abuse -He is for Lexiscan MPI today -Continue current medical therapy including ASA, Coreg, amlodipine, Lipitor, and newly added Imdur -No recent lipid panel, add on for further risk stratification, goal LDL < 70  2. Syncope: -Possibly in the setting of ongoing cocaine use -Monitor on telemetry -Echo -No driving x 6 months -Consider outpatient cardiac monitoring at discharge   3. Cocaine use: -Complete cessation recommended -He indicates he does not want his crack pipie given back to him at discharge   4. HTN: -Blood pressure mildly elevated this morning, brief episode of soft BP 8/3 -Continue current medical therapy including amlodipine, Coreg, Imdur, and losartan  5. Poorly controlled DM: -A1c 13.5 -Per IM  5. AKI: -Improving, dates back to admission in 07/2020  For questions or updates, please contact [facility] Please consult [email or web address] for contact info under Cardiology/STEMI.    Signed, Eula Listen, PA-C [facility] Pager: [phone] 10/22/2020, 7:25 AM

## 2020-10-22 NOTE — ED Notes (Signed)
Chelsea RN aware of assigned bed 

## 2020-10-22 NOTE — ED Notes (Signed)
Vital signs stable. 

## 2020-10-22 NOTE — Progress Notes (Signed)
PROGRESS NOTE    Steve Alvarado  ZOX:[id number] DOB: 1955-04-29 DOA: 10/21/2020 PCP: Ellery PlunkBarnhouse, [name], MD   Assessment & Plan:   Principal Problem:   Chest pain Active Problems:   CAD (coronary artery disease)   Diabetes mellitus (HCC)   Essential hypertension   Cocaine abuse (HCC)   Depression   Non compliance w medication regimen   Syncope: Likely vasovagal with polysubstance abuse, poorly controlled diabetes and medication noncompliance.  UDS positive for cocaine  Chest pain: Could be cocaine induced vasospasm.  Negative serial troponins.  MI ruled out Normal echo and negative stress test.  No further cardiac evaluation needed while in the hospital.  Consider ZIO monitor as an outpatient.  [facility]  Chronic pain: Resume oxycodone 7.5 mg p.o. every 6.  Patient is [facility] pain management according to patient  Essential HTN: continue on home dose of amlodipine, Coreg, Imdur, Cozaar  DM2: uncontrolled.  Hemoglobin A1c of 13.5 Start insulin Semglee 30 units twice daily.  Will consult TOC for helping him with medication as he has not taken any of his diabetes medication for more than 2 months.   HLD: continue on statin    GERD: continue on PPI   Depression: Continue Celexa and Remeron    DVT prophylaxis: lovenox Code Status: full Family Communication: Discussed with patient Disposition Plan: TOC involved as he is homeless for last 2 months.  He and his wife were kicked out of his Apt. 2 months ago  Level of care: Progressive Cardiac   Status is: Observation  The patient remains OBS appropriate and will d/c before 2 midnights., unsafe d/c plan, [facility]  Dispo: The patient is from:  Homeless/off the street              Anticipated d/c is to:  Shelter/group home depending on TOC input              Patient currently is medically stable to d/c.   Difficult to place patient Yes   Consultants:   Cardiology  Procedures: Normal echo and stress test  Antimicrobials: None   Subjective: Pt c/o pain all over his body from recent fall 2 days ago.  Requesting pain medication and help to find him a place to stay  Objective: Vitals:   10/22/20 0655 10/22/20 1000 10/22/20 1243 10/22/20 1620  BP: (!) 155/88 (!) 158/89 136/77 112/70  Pulse: 69 71 78 87  Resp:   14 19  Temp:    98.4 F (36.9 C)  TempSrc:      SpO2: 99% 99% 99% 100%  Weight:      Height:       No intake or output data in the 24 hours ending 10/22/20 1632  Filed Weights   10/21/20 0929  Weight: 79.4 kg    Examination:  General exam: Appears comfortable  Respiratory system: clear breath sounds b/l. No wheezes, rales  Cardiovascular system: S1 & S2+. No rubs or clicks  Gastrointestinal system: Abd is soft, NT, ND & hypoactive bowel sounds  Central nervous system: Alert and oriented. Moves all extremities  Psychiatry: judgement and insight appear normal. Flat mood and affect  Extremities: Left knee tenderness   Data Reviewed: I have personally reviewed following labs and imaging studies  CBC: Recent Labs  Lab 10/21/20 1005  WBC 7.8  NEUTROABS 4.9  HGB 13.1  HCT 36.2*  MCV 84.6  PLT 237  Basic Metabolic Panel: Recent Labs  Lab 10/21/20 1005  NA 130*  K 4.3  CL 94*  CO2 23  GLUCOSE 428*  BUN 33*  CREATININE 1.57*  CALCIUM 9.6   GFR: Estimated Creatinine Clearance: 52 mL/min (A) (by C-G formula based on SCr of 1.57 mg/dL (H)). Liver Function Tests: No results for input(s): AST, ALT, ALKPHOS, BILITOT, PROT, ALBUMIN in the last 168 hours.  No results for input(s): LIPASE, AMYLASE in the last 168 hours.  No results for input(s): AMMONIA in the last 168 hours. Coagulation Profile: No results for input(s): INR, PROTIME in the last 168 hours. Cardiac Enzymes: No results for input(s): CKTOTAL, CKMB, CKMBINDEX, TROPONINI in the last 168 hours. BNP (last 3 results) No results for input(s):  PROBNP in the last 8760 hours. HbA1C: Recent Labs    10/21/20 1219  HGBA1C 13.5*   CBG: Recent Labs  Lab 10/21/20 1255 10/21/20 1715 10/22/20 0839 10/22/20 1253  GLUCAP 340* 242* 292* 316*   Lipid Profile: Recent Labs    10/21/20 1219  CHOL 228*  HDL 94  LDLCALC 112*  TRIG 112  CHOLHDL 2.4   Thyroid Function Tests: No results for input(s): TSH, T4TOTAL, FREET4, T3FREE, THYROIDAB in the last 72 hours. Anemia Panel: No results for input(s): VITAMINB12, FOLATE, FERRITIN, TIBC, IRON, RETICCTPCT in the last 72 hours. Sepsis Labs: No results for input(s): PROCALCITON, LATICACIDVEN in the last 168 hours.  Recent Results (from the past 240 hour(s))  Resp Panel by RT-PCR (Flu A&B, Covid) Nasopharyngeal Swab     Status: None   Collection Time: 10/21/20 11:22 AM   Specimen: Nasopharyngeal Swab; Nasopharyngeal(NP) swabs in vial transport medium  Result Value Ref Range Status   SARS Coronavirus 2 by RT PCR NEGATIVE NEGATIVE Final    Comment: (NOTE) SARS-CoV-2 target nucleic acids are NOT DETECTED.  The SARS-CoV-2 RNA is generally detectable in upper respiratory specimens during the acute phase of infection. The lowest concentration of SARS-CoV-2 viral copies this assay can detect is 138 copies/mL. A negative result does not preclude SARS-Cov-2 infection and should not be used as the sole basis for treatment or other patient management decisions. A negative result may occur with  improper specimen collection/handling, submission of specimen other than nasopharyngeal swab, presence of viral mutation(s) within the areas targeted by this assay, and inadequate number of viral copies(<138 copies/mL). A negative result must be combined with clinical observations, patient history, and epidemiological information. The expected result is Negative.  Fact Sheet for Patients:  BloggerCourse.com  Fact Sheet for Healthcare Providers:   SeriousBroker.it  This test is no t yet approved or cleared by the [place] FDA and  has been authorized for detection and/or diagnosis of SARS-CoV-2 by FDA under an Emergency Use Authorization (EUA). This EUA will remain  in effect (meaning this test can be used) for the duration of the COVID-19 declaration under Section 564(b)(1) of the Act, 21 U.S.C.section 360bbb-3(b)(1), unless the authorization is terminated  or revoked sooner.       Influenza A by PCR NEGATIVE NEGATIVE Final   Influenza B by PCR NEGATIVE NEGATIVE Final    Comment: (NOTE) The Xpert Xpress SARS-CoV-2/FLU/RSV plus assay is intended as an aid in the diagnosis of influenza from Nasopharyngeal swab specimens and should not be used as a sole basis for treatment. Nasal washings and aspirates are unacceptable for Xpert Xpress SARS-CoV-2/FLU/RSV testing.  Fact Sheet for Patients: BloggerCourse.com  Fact Sheet for Healthcare Providers: SeriousBroker.it  This test is not yet approved  or cleared by the [place] and has been authorized for detection and/or diagnosis of SARS-CoV-2 by FDA under an Emergency Use Authorization (EUA). This EUA will remain in effect (meaning this test can be used) for the duration of the COVID-19 declaration under Section 564(b)(1) of the Act, 21 U.S.C. section 360bbb-3(b)(1), unless the authorization is terminated or revoked.  Performed at [facility], [address], [place], [place] [address]          Radiology Studies: DG Chest 2 View  Result Date: 10/21/2020 CLINICAL DATA:  Chest pain EXAM: CHEST - 2 VIEW COMPARISON:  None. FINDINGS: The heart size and mediastinal contours are within normal limits. Both lungs are clear. The visualized skeletal structures are unremarkable. IMPRESSION: No active cardiopulmonary disease. Electronically Signed   By: Charlett Nose M.D.   On: 10/21/2020  09:49   CT Head Wo Contrast  Result Date: 10/21/2020 CLINICAL DATA:  Head trauma, minor. Intermittent pressure of the chest and shortness of breath. EXAM: CT HEAD WITHOUT CONTRAST TECHNIQUE: Contiguous axial images were obtained from the base of the skull through the vertex without intravenous contrast. COMPARISON:  09/03/2020 FINDINGS: Brain: Mild age related atrophy. No sign of old or acute focal infarction, mass lesion, hemorrhage, hydrocephalus or extra-axial collection. Vascular: There is atherosclerotic calcification of the major vessels at the base of the brain. Skull: Negative Sinuses/Orbits: Clear/normal Other: None IMPRESSION: No acute or traumatic finding. Mild age related volume loss. Atherosclerotic calcification of the major vessels at the base of the brain, common at this age. Electronically Signed   By: Paulina Fusi M.D.   On: 10/21/2020 11:00   CT Cervical Spine Wo Contrast  Result Date: 10/21/2020 CLINICAL DATA:  Neck trauma EXAM: CT CERVICAL SPINE WITHOUT CONTRAST TECHNIQUE: Multidetector CT imaging of the cervical spine was performed without intravenous contrast. Multiplanar CT image reconstructions were also generated. COMPARISON:  08/06/2020 FINDINGS: Alignment: Reversal of cervical lordosis.  No listhesis Skull base and vertebrae: No acute fracture. Soft tissues and spinal canal: No prevertebral fluid or swelling. No visible canal hematoma. Disc levels: Notable left-sided facet osteoarthritis at C2-3 and C3-4. Generalized disc space narrowing that is mild for age Upper chest: Negative IMPRESSION: No evidence of cervical spine injury. Electronically Signed   By: Marnee Spring M.D.   On: 10/21/2020 11:01   NM Myocar Multi W/Spect W/Wall Motion / EF  Result Date: 10/22/2020 Pharmacological myocardial perfusion imaging study with no significant ischemia Normal wall motion, EF estimated at 46% (depressed ejection fraction secondary to GI uptake artifact) No EKG changes concerning for  ischemia at peak stress or in recovery. Resting EKG with nonspecific T wave abnormality V5, V6, 1 and aVL CT attenuation correction images with three-vessel coronary calcification Low risk scan Signed, Dossie Arbour, MD, Ph.D [facility]   ECHOCARDIOGRAM COMPLETE  Result Date: 10/22/2020    ECHOCARDIOGRAM REPORT   Patient Name:   Steve Alvarado Kohler Date of Exam: 10/22/2020 Medical Rec #:  [id number]         Height:       74.0 in Accession #:    [id number]        Weight:       175.0 lb Date of Birth:  [birth year]         BSA:          2.054 m Patient Age:    65 years          BP:  136/77 mmHg Patient Gender: M                 HR:           78 bpm. Exam Location:  [facility] Procedure: 2D Echo, Color Doppler and Cardiac Doppler Indications:     Syncope R55  History:         Patient has prior history of Echocardiogram examinations, most                  recent 08/07/2020. CAD; Risk Factors:Hypertension and Diabetes.  Sonographer:     Cristela Blue RDCS (AE) Referring Phys:  [id number] Sondra Barges Diagnosing Phys: Julien Nordmann MD IMPRESSIONS  1. Left ventricular ejection fraction, by estimation, is 60 to 65%. The left ventricle has normal function. The left ventricle has no regional wall motion abnormalities. There is mild left ventricular hypertrophy. Left ventricular diastolic parameters are consistent with Grade I diastolic dysfunction (impaired relaxation).  2. Right ventricular systolic function is normal. The right ventricular size is normal.  3. The mitral valve is normal in structure. No evidence of mitral valve regurgitation. No evidence of mitral stenosis. FINDINGS  Left Ventricle: Left ventricular ejection fraction, by estimation, is 60 to 65%. The left ventricle has normal function. The left ventricle has no regional wall motion abnormalities. The left ventricular internal cavity size was normal in size. There is  mild left ventricular hypertrophy. Left ventricular diastolic parameters are consistent with Grade I  diastolic dysfunction (impaired relaxation). Right Ventricle: The right ventricular size is normal. No increase in right ventricular wall thickness. Right ventricular systolic function is normal. Left Atrium: Left atrial size was normal in size. Right Atrium: Right atrial size was normal in size. Pericardium: There is no evidence of pericardial effusion. Mitral Valve: The mitral valve is normal in structure. No evidence of mitral valve regurgitation. No evidence of mitral valve stenosis. Tricuspid Valve: The tricuspid valve is normal in structure. Tricuspid valve regurgitation is mild . No evidence of tricuspid stenosis. Aortic Valve: The aortic valve is normal in structure. Aortic valve regurgitation is not visualized. No aortic stenosis is present. Aortic valve mean gradient measures 3.0 mmHg. Aortic valve peak gradient measures 5.0 mmHg. Aortic valve area, by VTI measures 2.96 cm. Pulmonic Valve: The pulmonic valve was normal in structure. Pulmonic valve regurgitation is not visualized. No evidence of pulmonic stenosis. Aorta: The aortic root is normal in size and structure. Venous: The inferior vena cava is normal in size with greater than 50% respiratory variability, suggesting right atrial pressure of 3 mmHg. IAS/Shunts: No atrial level shunt detected by color flow Doppler.  LEFT VENTRICLE PLAX 2D LVIDd:         3.63 cm  Diastology LVIDs:         2.66 cm  LV e' medial:    5.87 cm/s LV PW:         1.28 cm  LV E/e' medial:  9.6 LV IVS:        0.79 cm  LV e' lateral:   10.20 cm/s LVOT diam:     2.20 cm  LV E/e' lateral: 5.5 LV SV:         56 LV SV Index:   27 LVOT Area:     3.80 cm  RIGHT VENTRICLE RV Basal diam:  3.52 cm RV S prime:     20.00 cm/s TAPSE (M-mode): 4.4 cm LEFT ATRIUM           Index  RIGHT ATRIUM           Index LA diam:      3.20 cm 1.56 cm/m  RA Area:     11.90 cm LA Vol (A2C): 61.7 ml 30.05 ml/m RA Volume:   25.30 ml  12.32 ml/m LA Vol (A4C): 20.6 ml 10.03 ml/m  AORTIC VALVE                    PULMONIC VALVE AV Area (Vmax):    2.79 cm    PV Vmax:        0.67 m/s AV Area (Vmean):   2.84 cm    PV Peak grad:   1.8 mmHg AV Area (VTI):     2.96 cm    RVOT Peak grad: 3 mmHg AV Vmax:           112.00 cm/s AV Vmean:          85.100 cm/s AV VTI:            0.190 m AV Peak Grad:      5.0 mmHg AV Mean Grad:      3.0 mmHg LVOT Vmax:         82.20 cm/s LVOT Vmean:        63.600 cm/s LVOT VTI:          0.148 m LVOT/AV VTI ratio: 0.78  AORTA Ao Root diam: 3.37 cm MITRAL VALVE               TRICUSPID VALVE MV Area (PHT): 2.74 cm    TR Peak grad:   12.7 mmHg MV Decel Time: 277 msec    TR Vmax:        178.00 cm/s MV E velocity: 56.60 cm/s MV A velocity: 99.80 cm/s  SHUNTS MV E/A ratio:  0.57        Systemic VTI:  0.15 m                            Systemic Diam: 2.20 cm Julien Nordmann MD Electronically signed by Julien Nordmann MD Signature Date/Time: 10/22/2020/3:08:24 PM    Final          Scheduled Meds:  amLODipine  10 mg Oral Daily   aspirin EC  81 mg Oral Daily   atorvastatin  40 mg Oral QPM   carvedilol  6.25 mg Oral BID WC   citalopram  20 mg Oral Daily   enoxaparin (LOVENOX) injection  40 mg Subcutaneous Q24H   insulin aspart  0-15 Units Subcutaneous TID WC   insulin glargine-yfgn  30 Units Subcutaneous BID   isosorbide mononitrate  30 mg Oral Daily   levothyroxine  50 mcg Oral Q0600   losartan  100 mg Oral Daily   mirtazapine  7.5 mg Oral Daily   Continuous Infusions:     LOS: 0 days    Time spent: 30 mins    Delfino Lovett, MD [facility] Pager [phone]  If 7PM-7AM, please contact night-coverage 10/22/2020, 4:32 PM

## 2020-10-22 NOTE — Progress Notes (Signed)
*  PRELIMINARY RESULTS* Echocardiogram 2D Echocardiogram has been performed.  Steve Alvarado 10/22/2020, 2:43 PM

## 2020-10-22 NOTE — ED Notes (Signed)
Patient is resting comfortably. 

## 2020-10-22 NOTE — Progress Notes (Addendum)
Inpatient Diabetes Program Recommendations  AACE/ADA: New Consensus Statement on Inpatient Glycemic Control (2015)  Target Ranges:  Prepandial:   less than 140 mg/dL      Peak postprandial:   less than 180 mg/dL (1-2 hours)      Critically ill patients:  140 - 180 mg/dL   Lab Results  Component Value Date   GLUCAP 292 (H) 10/22/2020   HGBA1C 13.5 (H) 10/21/2020    Review of Glycemic Control Results for Steve Alvarado, Steve Alvarado (MRN [id number]) as of 10/22/2020 09:10  Ref. Range 10/21/2020 12:55 10/21/2020 17:15 10/22/2020 08:39  Glucose-Capillary Latest Ref Range: 70 - 99 mg/dL 665 (H) 993 (H) 570 (H)   Diabetes history: DM2 Outpatient Diabetes medications: Victoza 1.2 QD, Metformin, Lantus 60 units BID (not taking X 2 months) Current orders for Inpatient glycemic control: Novolog 0-15 units TID  Inpatient Diabetes Program Recommendations:    Semglee 30 units BID (50% of home dose).  Spoke with patient at bedside.  He is completely under covers and is not conversational.  He states he is supposed to be taking above home medications.  He is homeless and has been without medications > 2 months.  He does not have a meter to check his blood sugar.  Current A1C is 13.5%-Average blood sugar of 341 mg/dL).  Will likely need meal coverage once eating.    Will continue to follow while inpatient.  Thank you, Dulce Sellar, RN, BSN Diabetes Coordinator Inpatient Diabetes Program [phone] (team pager from 8a-5p)

## 2020-10-22 NOTE — ED Notes (Signed)
Patient denies pain and is resting comfortably.  

## 2020-10-23 ENCOUNTER — Other Ambulatory Visit: Payer: Self-pay

## 2020-10-23 ENCOUNTER — Observation Stay: Payer: Medicare Other

## 2020-10-23 DIAGNOSIS — I25118 Atherosclerotic heart disease of native coronary artery with other forms of angina pectoris: Secondary | ICD-10-CM | POA: Diagnosis not present

## 2020-10-23 LAB — CBC
HCT: 34.2 % — ABNORMAL LOW (ref 39.0–52.0)
Hemoglobin: 11.9 g/dL — ABNORMAL LOW (ref 13.0–17.0)
MCH: 30.1 pg (ref 26.0–34.0)
MCHC: 34.8 g/dL (ref 30.0–36.0)
MCV: 86.6 fL (ref 80.0–100.0)
Platelets: 262 10*3/uL (ref 150–400)
RBC: 3.95 MIL/uL — ABNORMAL LOW (ref 4.22–5.81)
RDW: 12.3 % (ref 11.5–15.5)
WBC: 5.6 10*3/uL (ref 4.0–10.5)
nRBC: 0 % (ref 0.0–0.2)

## 2020-10-23 LAB — BASIC METABOLIC PANEL
Anion gap: 6 (ref 5–15)
BUN: 46 mg/dL — ABNORMAL HIGH (ref 8–23)
CO2: 27 mmol/L (ref 22–32)
Calcium: 9 mg/dL (ref 8.9–10.3)
Chloride: 100 mmol/L (ref 98–111)
Creatinine, Ser: 2.24 mg/dL — ABNORMAL HIGH (ref 0.61–1.24)
GFR, Estimated: 32 mL/min — ABNORMAL LOW (ref >60)
Glucose, Bld: 223 mg/dL — ABNORMAL HIGH (ref 70–99)
Potassium: 4.4 mmol/L (ref 3.5–5.1)
Sodium: 133 mmol/L — ABNORMAL LOW (ref 135–145)

## 2020-10-23 LAB — GLUCOSE, CAPILLARY
Glucose-Capillary: 184 mg/dL — ABNORMAL HIGH (ref 70–99)
Glucose-Capillary: 299 mg/dL — ABNORMAL HIGH (ref 70–99)

## 2020-10-23 IMAGING — DX DG KNEE 1-2V PORT*L*
4 series · 4 of 4 positions shown · non-contrast
Comparison: None.

CLINICAL DATA: Fall from standing onto left knee 2 days ago

EXAM:
PORTABLE LEFT KNEE - 1-2 VIEW

[knee lat]
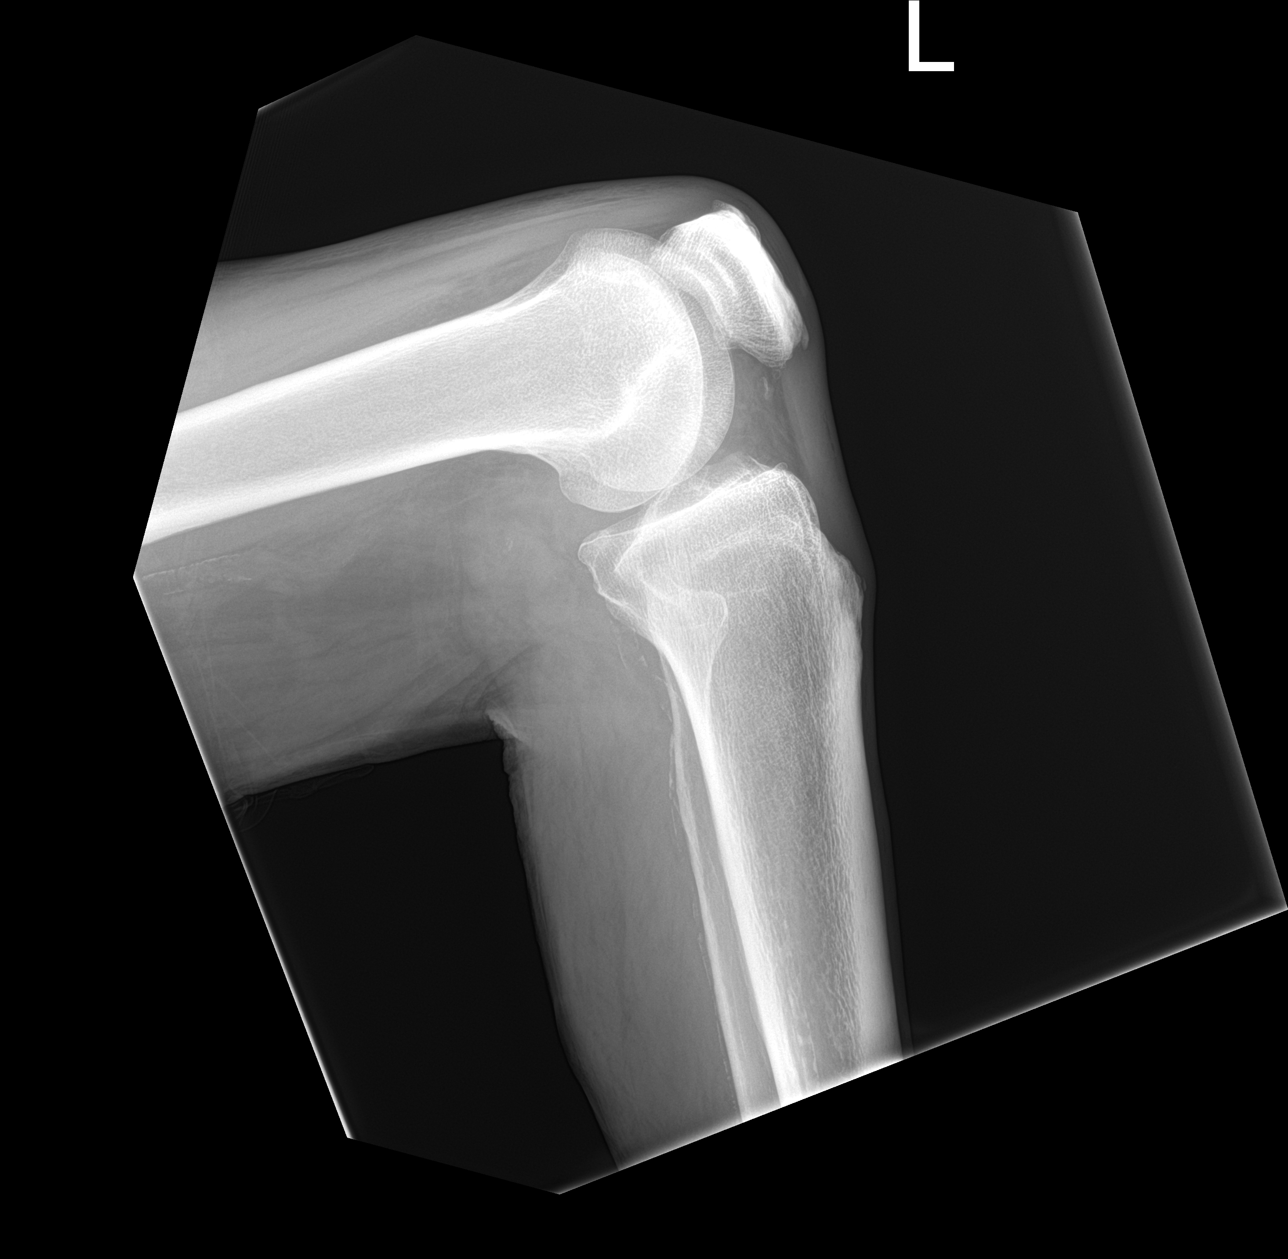

[knee obl (1 of 2)]
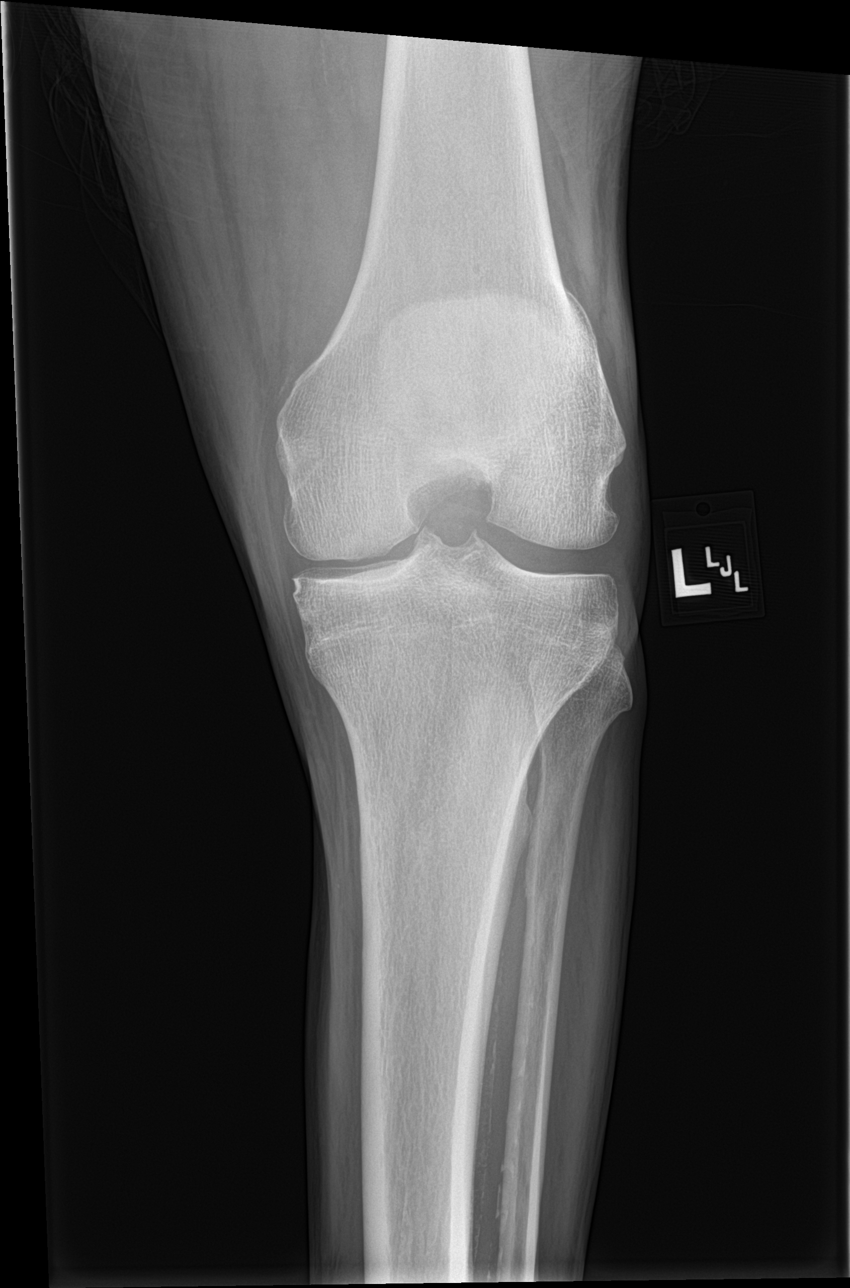

[knee obl (2 of 2)]
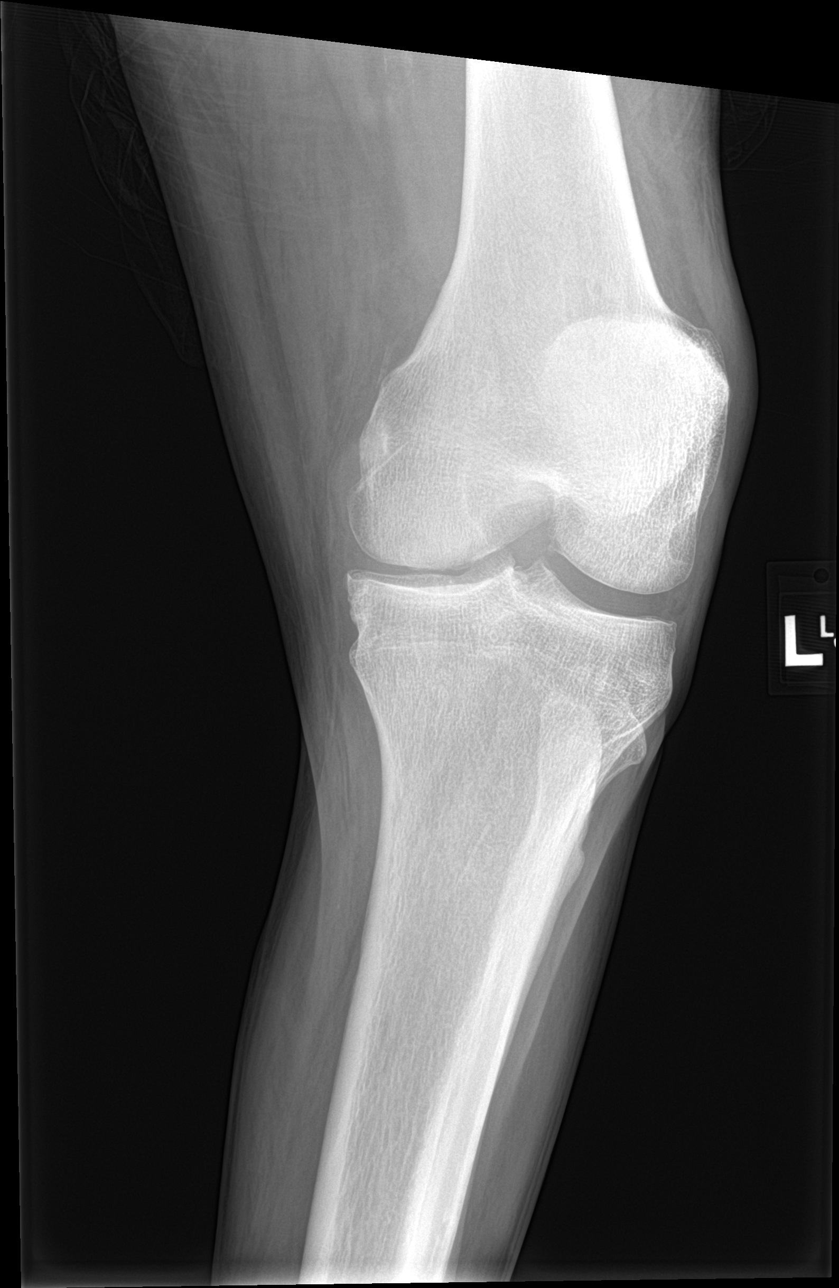

[patella ap]
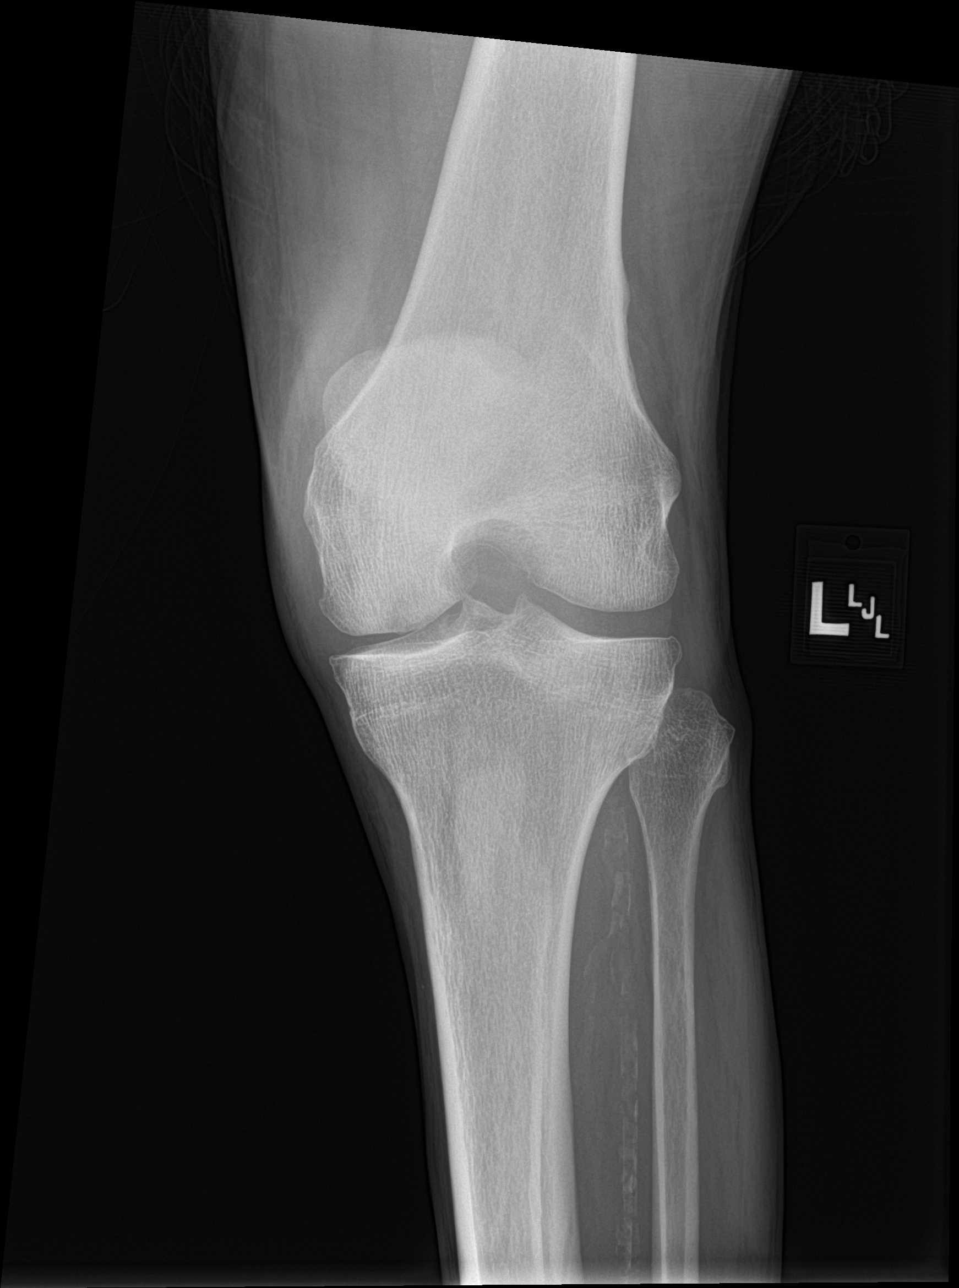

[4 of 4 positions shown; findings below may reference images not displayed]

FINDINGS: No evidence of fracture, dislocation, or joint effusion. Generalized
osteopenia. Atherosclerotic calcification of the thigh calf. Mild
degenerative marginal spurring at the knee.
IMPRESSION: No acute finding.

## 2020-10-23 MED ORDER — LEVOTHYROXINE SODIUM 50 MCG PO TABS
50.0000 ug | ORAL_TABLET | Freq: Every day | ORAL | 0 refills | Status: DC
  Filled 2020-10-23: qty 30, 30d supply, fill #0

## 2020-10-23 MED ORDER — INSULIN ASPART 100 UNIT/ML FLEXPEN
3.0000 [IU] | PEN_INJECTOR | Freq: Three times a day (TID) | SUBCUTANEOUS | 11 refills | Status: DC
Start: 2020-10-23 — End: 2021-07-08
  Filled 2020-10-23: qty 3, 33d supply, fill #0

## 2020-10-23 MED ORDER — CITALOPRAM HYDROBROMIDE 20 MG PO TABS
20.0000 mg | ORAL_TABLET | Freq: Every day | ORAL | 0 refills | Status: DC
  Filled 2020-10-23: qty 30, 30d supply, fill #0

## 2020-10-23 MED ORDER — ATORVASTATIN CALCIUM 40 MG PO TABS
40.0000 mg | ORAL_TABLET | Freq: Every evening | ORAL | 0 refills | Status: AC
  Filled 2020-10-23: qty 30, 30d supply, fill #0

## 2020-10-23 MED ORDER — CARVEDILOL 3.125 MG PO TABS
3.1250 mg | ORAL_TABLET | Freq: Two times a day (BID) | ORAL | Status: DC
  Filled 2020-10-23: qty 1

## 2020-10-23 MED ORDER — ASPIRIN 81 MG PO TBEC: 81.0000 mg | DELAYED_RELEASE_TABLET | Freq: Every day | ORAL | 11 refills | Status: DC

## 2020-10-23 MED ORDER — SODIUM CHLORIDE 0.9 % IV BOLUS
1000.0000 mL | INTRAVENOUS | Status: AC
  Administered 2020-10-23: 1000 mL via INTRAVENOUS

## 2020-10-23 MED ORDER — ASPIRIN 81 MG PO TBEC
81.0000 mg | DELAYED_RELEASE_TABLET | Freq: Every day | ORAL | 11 refills | Status: DC
  Filled 2020-10-23: qty 30, 30d supply, fill #0

## 2020-10-23 MED ORDER — INSULIN ASPART 100 UNIT/ML FLEXPEN
3.0000 [IU] | PEN_INJECTOR | Freq: Three times a day (TID) | SUBCUTANEOUS | 11 refills | Status: DC
Start: 2020-10-23 — End: 2020-10-23
  Filled 2020-10-23: qty 15, fill #0

## 2020-10-23 MED ORDER — INSULIN DETEMIR 100 UNIT/ML ~~LOC~~ SOLN
30.0000 [IU] | Freq: Two times a day (BID) | SUBCUTANEOUS | 11 refills | Status: DC
  Filled 2020-10-23: qty 10, 17d supply, fill #0

## 2020-10-23 MED ORDER — GABAPENTIN 300 MG PO CAPS
900.0000 mg | ORAL_CAPSULE | Freq: Two times a day (BID) | ORAL | 0 refills | Status: DC
  Filled 2020-10-23: qty 60, 10d supply, fill #0

## 2020-10-23 MED ORDER — INSULIN DETEMIR 100 UNIT/ML ~~LOC~~ SOLN
30.0000 [IU] | Freq: Two times a day (BID) | SUBCUTANEOUS | 11 refills | Status: DC
Start: 2020-10-23 — End: 2021-07-08
  Filled 2020-10-23: qty 10, 17d supply, fill #0

## 2020-10-23 MED ORDER — CARVEDILOL 3.125 MG PO TABS
3.1250 mg | ORAL_TABLET | Freq: Two times a day (BID) | ORAL | 0 refills | Status: DC
  Filled 2020-10-23: qty 60, 30d supply, fill #0

## 2020-10-23 MED ORDER — ISOSORBIDE MONONITRATE ER 30 MG PO TB24
30.0000 mg | ORAL_TABLET | Freq: Every day | ORAL | 0 refills | Status: DC
  Filled 2020-10-23: qty 30, 30d supply, fill #0

## 2020-10-23 MED ORDER — OXYCODONE-ACETAMINOPHEN 7.5-325 MG PO TABS
1.0000 | ORAL_TABLET | Freq: Two times a day (BID) | ORAL | 0 refills | Status: DC | PRN
  Filled 2020-10-23: qty 5, 3d supply, fill #0

## 2020-10-23 MED ORDER — CARVEDILOL 3.125 MG PO TABS
3.1250 mg | ORAL_TABLET | Freq: Two times a day (BID) | ORAL | 0 refills | Status: DC
  Filled 2020-10-23 (×2): qty 60, 30d supply, fill #0

## 2020-10-23 MED ORDER — HUMALOG 100 UNIT/ML IJ SOLN: 3.0000 [IU] | Freq: Three times a day (TID) | INTRAMUSCULAR | 11 refills | Status: DC

## 2020-10-23 MED ORDER — LANTUS 100 UNIT/ML ~~LOC~~ SOLN: 30.0000 [IU] | Freq: Two times a day (BID) | SUBCUTANEOUS | 11 refills | Status: DC

## 2020-10-23 MED ORDER — CARVEDILOL 3.125 MG PO TABS
3.1250 mg | ORAL_TABLET | Freq: Two times a day (BID) | ORAL | 0 refills | Status: DC
Start: 2020-10-23 — End: 2020-10-23

## 2020-10-23 MED ORDER — ATORVASTATIN CALCIUM 40 MG PO TABS
40.0000 mg | ORAL_TABLET | Freq: Every evening | ORAL | 0 refills | Status: DC
  Filled 2020-10-23: qty 30, 30d supply, fill #0

## 2020-10-23 MED ORDER — ISOSORBIDE MONONITRATE ER 30 MG PO TB24
30.0000 mg | ORAL_TABLET | Freq: Every day | ORAL | 0 refills | Status: DC
Start: 2020-10-23 — End: 2020-10-23

## 2020-10-23 NOTE — TOC Initial Note (Addendum)
Transition of Care [facility]) - Initial/Assessment Note    Patient Details  Name: Steve Alvarado MRN: [id number] Date of Birth: [birth year]  [facility] CM/SW Contact:    Liliana Cline, LCSW Phone Number: 10/23/2020, 9:28 AM  Clinical Narrative:            Patient is medically cleared for discharge. Spoke to patient. Patient is homeless and said he has been staying on the street. Patient previously went to [employer], but said he cannot return because they wanted him to do heavy lifting. Patient says he can work just not heavy lifting. Patient said he called [facility] who has no beds. CSW called [facility], confirmed with Supervisor Trinna Post that patient can come there today. Arranged Insurance risk surveyor for [employer] pickup with [employer]. Address [address] [place], [place] [address]. Notified patient, RN, and MD.      9:55- [facility] pushed back to 1:00 pick up per MD/patient request. Asked pharmacist if we can do meds to beds.   4:50- Called [facility] as they called [employer] and said no one approved for patient to come there. Called and spoke with Clifton Custard at [facility]. Informed Clifton Custard that I spoke with Trinna Post this morning. Clifton Custard reported patient is fine to stay since CSW spoke with alex this morning.  Expected Discharge Plan: Homeless Shelter Barriers to Discharge: Barriers Resolved   Patient Goals and CMS Choice Patient states their goals for this hospitalization and ongoing recovery are:: to find a shelter to go to [employer].gov Compare Post Acute Care list provided to:: Patient Choice offered to / list presented to : Patient  Expected Discharge Plan and Services Expected Discharge Plan: Homeless Shelter     Post Acute Care Choice: NA Living arrangements for the past 2 months: Homeless                                      Prior Living Arrangements/Services Living arrangements for the past 2 months:  Homeless Lives with:: Self Patient language and need for interpreter reviewed:: Yes Do you feel safe going back to the place where you live?: No   homeless, says he does not feel safe going "back on the street"  Need for Family Participation in Patient Care: Yes (Comment) Care giver support system in place?: Yes (comment)   Criminal Activity/Legal Involvement Pertinent to Current Situation/Hospitalization: No - Comment as needed  Activities of Daily Living Home Assistive Devices/Equipment: Other (Comment) (crutches) ADL Screening (condition at time of admission) Patient's cognitive ability adequate to safely complete daily activities?: Yes Is the patient deaf or have difficulty hearing?: No Does the patient have difficulty seeing, even when wearing glasses/contacts?: No Does the patient have difficulty concentrating, remembering, or making decisions?: No Patient able to express need for assistance with ADLs?: Yes Does the patient have difficulty dressing or bathing?: Yes Independently performs ADLs?: No Communication: Independent Dressing (OT): Needs assistance Is this a change from baseline?: Pre-admission baseline Grooming: Needs assistance Is this a change from baseline?: Pre-admission baseline Feeding: Independent Bathing: Needs assistance Is this a change from baseline?: Pre-admission baseline Toileting: Independent In/Out Bed: Independent Walks in Home: Independent with device (comment) Does the patient have difficulty walking or climbing stairs?: Yes Weakness of Legs: Both Weakness of Arms/Hands: Both  Permission Sought/Granted Permission sought to share information with : Oceanographer  granted to share information with : Yes, Verbal Permission Granted     Permission granted to share info w AGENCY: homeless shelters, [facility]        Emotional Assessment       Orientation: : Oriented to Self, Oriented to Place, Oriented to   Time, Oriented to Situation Alcohol / Substance Use: Not Applicable Psych Involvement: No (comment)  Admission diagnosis:  Chest pain [R07.9] Syncope, unspecified syncope type [R55] Chest pain, unspecified type [R07.9] Patient Active Problem List   Diagnosis Date Noted   Chest pain 10/21/2020   Depression 10/21/2020   Non compliance w medication regimen 10/21/2020   Cocaine abuse (HCC) 09/05/2020   Adjustment disorder with emotional disturbance 09/05/2020   Hypertensive urgency 03/08/2015   Essential hypertension 02/14/2011   CAD (coronary artery disease) 08/13/2010   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), [place] - [address] [place]) [place] [address] Phone: [phone] Fax: [phone]     Social Determinants of Health (SDOH) Interventions    Readmission Risk Interventions No flowsheet data found.

## 2020-10-23 NOTE — Plan of Care (Signed)

## 2020-10-23 NOTE — Progress Notes (Signed)
Progress Note  Patient Name: Steve Alvarado Date of Encounter: 10/23/2020  Primary Cardiologist: Mariah Milling  Subjective   No further chest pain, dizziness, presyncope, or syncope. Notes chronic hand and knee pain in the setting of arthritis. Has not been taking medications at home. Lexiscan MPI yesterday showed no significant ischemia and was overall low risk. Echo showed preserved LVSF and normal wall motion. Bump in renal function this morning.   Inpatient Medications    Scheduled Meds:  amLODipine  10 mg Oral Daily   aspirin EC  81 mg Oral Daily   atorvastatin  40 mg Oral QPM   carvedilol  6.25 mg Oral BID WC   citalopram  20 mg Oral Daily   enoxaparin (LOVENOX) injection  40 mg Subcutaneous Q24H   insulin aspart  0-15 Units Subcutaneous TID WC   insulin glargine-yfgn  30 Units Subcutaneous BID   isosorbide mononitrate  30 mg Oral Daily   levothyroxine  50 mcg Oral Q0600   losartan  100 mg Oral Daily   mirtazapine  7.5 mg Oral Daily   Continuous Infusions:  PRN Meds: acetaminophen, ondansetron (ZOFRAN) IV, oxyCODONE   Vital Signs    Vitals:   10/22/20 1620 10/22/20 2023 10/22/20 2225 10/23/20 0451  BP: 112/70 (!) 85/43 (!) 100/55 (!) 103/59  Pulse: 87 69 66 70  Resp: 19 18  18   Temp: 98.4 F (36.9 C) 98.5 F (36.9 C)  98.7 F (37.1 C)  TempSrc:  Oral  Oral  SpO2: 100% 99%  100%  Weight:      Height:        Intake/Output Summary (Last 24 hours) at 10/23/2020 0732 Last data filed at 10/23/2020 0540 Gross per 24 hour  Intake 420 ml  Output 800 ml  Net -380 ml    Filed Weights   10/21/20 0929  Weight: 79.4 kg    Telemetry    SR, no significant arrhythmias, prolonged pauses, or evidence of high-grade AV block - Personally Reviewed  ECG    No new tracings - Personally Reviewed  Physical Exam   GEN: No acute distress.   Neck: No JVD. Cardiac: RRR, no murmurs, rubs, or gallops.  Respiratory: Clear to auscultation bilaterally.  GI: Soft, nontender,  non-distended.   MS: No edema; No deformity. Neuro:  Alert and oriented x 3; Nonfocal.  Psych: Normal affect.  Labs    Chemistry Recent Labs  Lab 10/21/20 1005 10/23/20 0503  NA 130* 133*  K 4.3 4.4  CL 94* 100  CO2 23 27  GLUCOSE 428* 223*  BUN 33* 46*  CREATININE 1.57* 2.24*  CALCIUM 9.6 9.0  GFRNONAA 48* 32*  ANIONGAP 13 6      Hematology Recent Labs  Lab 10/21/20 1005 10/23/20 0503  WBC 7.8 5.6  RBC 4.28 3.95*  HGB 13.1 11.9*  HCT 36.2* 34.2*  MCV 84.6 86.6  MCH 30.6 30.1  MCHC 36.2* 34.8  RDW 12.3 12.3  PLT 237 262     Cardiac EnzymesNo results for input(s): TROPONINI in the last 168 hours. No results for input(s): TROPIPOC in the last 168 hours.   BNPNo results for input(s): BNP, PROBNP in the last 168 hours.   DDimer  Recent Labs  Lab 10/21/20 1219  DDIMER 0.57*      Radiology    DG Chest 2 View  Result Date: 10/21/2020 IMPRESSION: No active cardiopulmonary disease. Electronically Signed   By: 12/21/2020 M.D.   On: 10/21/2020 09:49  CT Head Wo Contrast  Result Date: 10/21/2020 IMPRESSION: No acute or traumatic finding. Mild age related volume loss. Atherosclerotic calcification of the major vessels at the base of the brain, common at this age. Electronically Signed   By: Paulina Fusi M.D.   On: 10/21/2020 11:00   CT Cervical Spine Wo Contrast  Result Date: 10/21/2020 IMPRESSION: No evidence of cervical spine injury. Electronically Signed   By: Marnee Spring M.D.   On: 10/21/2020 11:01    Cardiac Studies   2D echo 08/07/2020: 1. Left ventricular ejection fraction, by estimation, is 55 to 60%. The  left ventricle has normal function. The left ventricle has no regional  wall motion abnormalities. Left ventricular diastolic parameters were  normal.   2. Right ventricular systolic function is normal. The right ventricular  size is normal.   3. Left atrial size was mild to moderately dilated.   4. Right atrial size was mild to moderately  dilated.   5. The mitral valve is normal in structure. No evidence of mitral valve  regurgitation.   6. The aortic valve is normal in structure. Aortic valve regurgitation is  not visualized.  __________  LHC 06/2013: Mid LAD 30%, distal LAD 20%, mid LCx 100%, OM1 60%, proximal RCA mid RCA stent 50% ISR, 50%, distal RCA 30%, rPDA 90%, EF 55%. Aggressive medical therapy and cocaine cessation was recommended.   Patient Profile     65 y.o. male with history of CAD s/p PCI to the RCA, cocaine use, poorly controlled DM2, HTN, and hypothyroidism admitted with chest pain and syncope in the context of ongoing cocaine use.   Assessment & Plan    1. CAD involving the native coronary arteries s/p PCI to the mid RCA with mildly elevated troponin and chest pain: -Minimal troponin elevation is not consistent with ACS -Cannot exclude coronary vasospasm with ongoing cocaine abuse -Lexiscan MPI without evidence of ishcemia -Continue current medical therapy including ASA, Coreg, Lipitor, and newly added Imdur -No plans for further cardiac testing at this time  2. Syncope: -Possibly in the setting of ongoing cocaine use -Monitor on telemetry while admitted  -Echo with preserved LVSF and normal wall motion -No driving x 6 months -Consider outpatient cardiac monitoring at discharge   3. Cocaine use: -Complete cessation recommended -He indicates he does not want his crack pipie given back to him at discharge   4. HTN: -Blood pressure soft this morning -Hold losartan and amlodipine -Decrease Coreg to 3.125 mg bid -Medication nonadherence at home    5. Poorly controlled DM: -A1c 13.5 -Per IM  5. AKI: -Avoid nephrotoxic agents -Per IM  6. HLD: -LDL 112 this admission with goal being < 70 -Lipitor -Outpatient follow up labs and LFT in ~ 8 weeks   For questions or updates, please contact [facility] Please consult [email or web address] for contact info under Cardiology/STEMI.     Signed, Eula Listen, PA-C [facility] Pager: [phone] 10/23/2020, 7:32 AM

## 2020-10-23 NOTE — Care Management Obs Status (Signed)
MEDICARE OBSERVATION STATUS NOTIFICATION   Patient Details  Name: Steve Alvarado MRN: [id number] Date of Birth: Jul 28, 1955   Medicare Observation Status Notification Given:  Yes  Reviewed with patient via phone. Copy printed for patient.  Aloysuis Ribaudo E Vere Diantonio, LCSW 10/23/2020, 9:25 AM

## 2020-10-24 NOTE — Discharge Summary (Signed)
5       [place] [facility]   PATIENT NAME: Steve Alvarado    MR#:  [id number]  DATE OF BIRTH:  1956/01/14  DATE OF ADMISSION:  10/21/2020   ADMITTING PHYSICIAN: Lucile Shutters, MD  DATE OF DISCHARGE: 10/23/2020  1:19 PM  PRIMARY CARE PHYSICIAN: Ellery Plunk, MD   ADMISSION DIAGNOSIS:  Chest pain [R07.9] Syncope, unspecified syncope type [R55] Chest pain, unspecified type [R07.9] DISCHARGE DIAGNOSIS:  Principal Problem:   Chest pain Active Problems:   CAD (coronary artery disease)   Diabetes mellitus (HCC)   Essential hypertension   Cocaine abuse (HCC)   Depression   Non compliance w medication regimen  SECONDARY DIAGNOSIS:   Past Medical History:  Diagnosis Date   Coronary artery disease    Diabetes mellitus    Type II   Hyperlipidemia    Hypertension    Thyroid disease    HOSPITAL COURSE:  65 y.o. male with medical history significant for diabetes mellitus, hypertension, hypothyroidism, chronic cocaine use admitted for 2-day history of midsternal chest pain associated with multiple syncopal episodes.  Syncope: Likely vasovagal with polysubstance/ongoing cocaine abuse, poorly controlled diabetes and medication noncompliance.  UDS positive for cocaine.  Patient did not have any further syncopal spell in the hospital. -He was recommended no driving for at least 6 months. -Echo with preserved LV systolic function and normal wall motion.  No cardiac arrhythmia detected on telemetry   Chest pain with a history of coronary disease: Likely cocaine induced vasospasm.  Negative serial troponins.  MI ruled out Normal echo and negative stress test.  No further cardiac evaluation needed while in the hospital.  Consider ZIO monitor as an outpatient.  Followed by [facility] cardiology as an outpatient Continue aspirin, Coreg, Lipitor and Imdur Recommend outpatient cardiology follow-up at [facility]  Left knee pain: Patient Complaining of knee pain for which x-ray was  performed which showed no acute abnormality.  I have requested him to follow-up with orthopedic as an outpatient if he continues to have pain.   Chronic pain:  Patient is followed at [facility] pain management according to patient.  This was his biggest complaint and was backing to get few pills of oxycodone.  I have given him total 5 pills at discharge.   Essential HTN: continue Coreg, Imdur   DM2: uncontrolled.  Hemoglobin A1c of 13.5.  He is started on insulin  AKI -likely prerenal.  We have given him IV fluids while in the hospital including bolus on the day of discharge. -We will need close monitoring of renal function as an outpatient.  I have requested him to stay hydrated. -I have requested him to follow-up with nephrology as an outpatient   HLD: continue on statin   GERD: continue on PPI    Depression: Continue Celexa and Remeron  Homelessness > 2 months: He has been living on the street.  This seem to be the biggest reason he has been struggling with other aspects.  We were able to get him placed at [facility] rescue.    Medication noncompliance: Patient has not been seeing any doctors or not been taking any medications for more than 2 months since he has been on the street.  We have given him meds to beds delivered before discharge and requested him to make sure he closely follows up with his primary care, cardiology and pain management doctors.    DISCHARGE CONDITIONS:  Fair CONSULTS OBTAINED:  Treatment Team:  Debbe Odea, MD  DRUG ALLERGIES:   Allergies  Allergen Reactions   Ace Inhibitors Cough    cough   DISCHARGE MEDICATIONS:   Allergies as of 10/23/2020       Reactions   Ace Inhibitors Cough   cough        Medication List     STOP taking these medications    amLODipine 10 MG tablet Commonly known as: NORVASC   hydrochlorothiazide 25 MG tablet Commonly known as: HYDRODIURIL   losartan 100 MG tablet Commonly known as: COZAAR   metFORMIN 1000  MG tablet Commonly known as: GLUCOPHAGE   mirtazapine 15 MG tablet Commonly known as: REMERON   Victoza 18 MG/3ML Sopn Generic drug: liraglutide       TAKE these medications    aspirin 81 MG EC tablet Take 1 tablet (81 mg total) by mouth daily. Swallow whole.   atorvastatin 40 MG tablet Commonly known as: LIPITOR Take 1 tablet (40 mg total) by mouth every evening.   carvedilol 3.125 MG tablet Commonly known as: COREG Take 1 tablet (3.125 mg total) by mouth 2 (two) times daily with a meal.   citalopram 20 MG tablet Commonly known as: CELEXA Take 1 tablet (20 mg total) by mouth daily.   gabapentin 300 MG capsule Commonly known as: NEURONTIN Take 3 capsules (900 mg total) by mouth 2 (two) times daily.   insulin aspart 100 UNIT/ML FlexPen Commonly known as: NOVOLOG Inject 3 Units into the skin 3 (three) times daily with meals.   insulin detemir 100 UNIT/ML injection Commonly known as: Levemir Inject 30 units under the skin two times daily (Inject 0.3 mLs (30 Units total) into the skin 2 (two) times daily.)   isosorbide mononitrate 30 MG 24 hr tablet Commonly known as: IMDUR Take 1 tablet (30 mg total) by mouth daily.   levothyroxine 50 MCG tablet Commonly known as: SYNTHROID Take 1 tablet (50 mcg total) by mouth daily.   oxyCODONE-acetaminophen 7.5-325 MG tablet Commonly known as: PERCOCET Take 1 tablet by mouth 2 (two) times daily as needed for severe pain. What changed: reasons to take this       DISCHARGE INSTRUCTIONS:   DIET:  Cardiac diet DISCHARGE CONDITION:  Stable ACTIVITY:  Activity as tolerated OXYGEN:  Home Oxygen: No.  Oxygen Delivery: room air DISCHARGE LOCATION:  home   If you experience worsening of your admission symptoms, develop shortness of breath, life threatening emergency, suicidal or homicidal thoughts you must seek medical attention immediately by calling 911 or calling your MD immediately  if symptoms less severe.  You  Must read complete instructions/literature along with all the possible adverse reactions/side effects for all the Medicines you take and that have been prescribed to you. Take any new Medicines after you have completely understood and accpet all the possible adverse reactions/side effects.   Please note  You were cared for by a hospitalist during your hospital stay. If you have any questions about your discharge medications or the care you received while you were in the hospital after you are discharged, you can call the unit and asked to speak with the hospitalist on call if the hospitalist that took care of you is not available. Once you are discharged, your primary care physician will handle any further medical issues. Please note that NO REFILLS for any discharge medications will be authorized once you are discharged, as it is imperative that you return to your primary care physician (or establish a relationship with a primary care physician if  you do not have one) for your aftercare needs so that they can reassess your need for medications and monitor your lab values.    On the day of Discharge:  VITAL SIGNS:  Blood pressure 125/61, pulse 72, temperature 97.8 F (36.6 C), resp. rate 17, height 6\' 2"  (1.88 m), weight 79.4 kg, SpO2 100 %. PHYSICAL EXAMINATION:  GENERAL:  65 y.o.-year-old patient lying in the bed with no acute distress.  EYES: Pupils equal, round, reactive to light and accommodation. No scleral icterus. Extraocular muscles intact.  HEENT: Head atraumatic, normocephalic. Oropharynx and nasopharynx clear.  NECK:  Supple, no jugular venous distention. No thyroid enlargement, no tenderness.  LUNGS: Normal breath sounds bilaterally, no wheezing, rales,rhonchi or crepitation. No use of accessory muscles of respiration.  CARDIOVASCULAR: S1, S2 normal. No murmurs, rubs, or gallops.  ABDOMEN: Soft, non-tender, non-distended. Bowel sounds present. No organomegaly or mass.  EXTREMITIES:  No pedal edema, cyanosis, or clubbing.  NEUROLOGIC: Cranial nerves II through XII are intact. Muscle strength 5/5 in all extremities. Sensation intact. Gait not checked.  PSYCHIATRIC: The patient is alert and oriented x 3.  SKIN: No obvious rash, lesion, or ulcer.  DATA REVIEW:   CBC Recent Labs  Lab 10/23/20 0503  WBC 5.6  HGB 11.9*  HCT 34.2*  PLT 262    Chemistries  Recent Labs  Lab 10/23/20 0503  NA 133*  K 4.4  CL 100  CO2 27  GLUCOSE 223*  BUN 46*  CREATININE 2.24*  CALCIUM 9.0     Outpatient follow-up  Follow-up Information     [name], 12/23/20, MD. Schedule an appointment as soon as possible for a visit in 3 day(s).   Specialty: Family Medicine Why: [facility] Discharge F/UP Contact information: [address] [facility] [place] [phone]         [phone], Dedra Skeens. Schedule an appointment as soon as possible for a visit in 1 week(s).   Specialty: Orthopedic Surgery Why: [facility] Discharge F/UP, If symptoms worsen, As needed for left knee pain Contact information: [address] [place] [phone]         [phone], MD. Schedule an appointment as soon as possible for a visit in 2 week(s).   Specialty: Nephrology Why: As needed, [facility] Discharge F/UP for kidney function monitoring Contact information: [address] [place] [phone]                    Management plans discussed with the patient, family and they are in agreement.  CODE STATUS: Prior   TOTAL TIME TAKING CARE OF THIS PATIENT: 45 minutes.    [phone] M.D on 10/24/2020 at 5:45 PM  [facility]   CC: Primary care physician; 12/24/2020, MD   Note: This dictation was prepared with Dragon dictation along with smaller phrase technology. Any transcriptional errors that result from this process are unintentional.

## 2021-01-24 ENCOUNTER — Emergency Department (HOSPITAL_COMMUNITY)
Admission: EM | Admit: 2021-01-24 | Discharge: 2021-01-25 | Disposition: A | Discharge status: Skilled Nursing Facility | Payer: Medicare Other | Attending: Emergency Medicine | Admitting: Emergency Medicine

## 2021-01-24 ENCOUNTER — Encounter (HOSPITAL_COMMUNITY): Payer: Self-pay | Admitting: Emergency Medicine

## 2021-01-24 ENCOUNTER — Other Ambulatory Visit: Payer: Self-pay

## 2021-01-24 ENCOUNTER — Emergency Department (HOSPITAL_COMMUNITY): Payer: Medicare Other

## 2021-01-24 DIAGNOSIS — R1012 Left upper quadrant pain: Secondary | ICD-10-CM

## 2021-01-24 DIAGNOSIS — Z794 Long term (current) use of insulin: Secondary | ICD-10-CM | POA: Diagnosis not present

## 2021-01-24 DIAGNOSIS — Z7982 Long term (current) use of aspirin: Secondary | ICD-10-CM | POA: Diagnosis not present

## 2021-01-24 DIAGNOSIS — I10 Essential (primary) hypertension: Secondary | ICD-10-CM | POA: Diagnosis not present

## 2021-01-24 DIAGNOSIS — K5900 Constipation, unspecified: Secondary | ICD-10-CM | POA: Diagnosis not present

## 2021-01-24 DIAGNOSIS — I2510 Atherosclerotic heart disease of native coronary artery without angina pectoris: Secondary | ICD-10-CM | POA: Diagnosis not present

## 2021-01-24 DIAGNOSIS — E119 Type 2 diabetes mellitus without complications: Secondary | ICD-10-CM | POA: Insufficient documentation

## 2021-01-24 DIAGNOSIS — Z79899 Other long term (current) drug therapy: Secondary | ICD-10-CM | POA: Insufficient documentation

## 2021-01-24 LAB — COMPREHENSIVE METABOLIC PANEL WITH GFR
ALT: 18 U/L (ref 0–44)
AST: 17 U/L (ref 15–41)
Albumin: 3.6 g/dL (ref 3.5–5.0)
Alkaline Phosphatase: 134 U/L — ABNORMAL HIGH (ref 38–126)
Anion gap: 10 (ref 5–15)
BUN: 24 mg/dL — ABNORMAL HIGH (ref 8–23)
CO2: 25 mmol/L (ref 22–32)
Calcium: 9.7 mg/dL (ref 8.9–10.3)
Chloride: 99 mmol/L (ref 98–111)
Creatinine, Ser: 1.1 mg/dL (ref 0.61–1.24)
GFR, Estimated: >60 mL/min
Glucose, Bld: 110 mg/dL — ABNORMAL HIGH (ref 70–99)
Potassium: 5 mmol/L (ref 3.5–5.1)
Sodium: 134 mmol/L — ABNORMAL LOW (ref 135–145)
Total Bilirubin: 0.6 mg/dL (ref 0.3–1.2)
Total Protein: 7 g/dL (ref 6.5–8.1)

## 2021-01-24 LAB — CBC WITH DIFFERENTIAL/PLATELET
Abs Immature Granulocytes: 0.01 10*3/uL (ref 0.00–0.07)
Basophils Absolute: 0 10*3/uL (ref 0.0–0.1)
Basophils Relative: 0 %
Eosinophils Absolute: 0.4 10*3/uL (ref 0.0–0.5)
Eosinophils Relative: 6 %
HCT: 34.8 % — ABNORMAL LOW (ref 39.0–52.0)
Hemoglobin: 11.8 g/dL — ABNORMAL LOW (ref 13.0–17.0)
Immature Granulocytes: 0 %
Lymphocytes Relative: 39 %
Lymphs Abs: 2.6 10*3/uL (ref 0.7–4.0)
MCH: 30.6 pg (ref 26.0–34.0)
MCHC: 33.9 g/dL (ref 30.0–36.0)
MCV: 90.2 fL (ref 80.0–100.0)
Monocytes Absolute: 0.5 10*3/uL (ref 0.1–1.0)
Monocytes Relative: 7 %
Neutro Abs: 3.2 10*3/uL (ref 1.7–7.7)
Neutrophils Relative %: 48 %
Platelets: 289 10*3/uL (ref 150–400)
RBC: 3.86 MIL/uL — ABNORMAL LOW (ref 4.22–5.81)
RDW: 12.4 % (ref 11.5–15.5)
WBC: 6.8 10*3/uL (ref 4.0–10.5)
nRBC: 0 % (ref 0.0–0.2)

## 2021-01-24 LAB — URINALYSIS, ROUTINE W REFLEX MICROSCOPIC
Bacteria, UA: NONE SEEN
Bilirubin Urine: NEGATIVE
Glucose, UA: 50 mg/dL — AB
Hgb urine dipstick: NEGATIVE
Ketones, ur: NEGATIVE mg/dL
Leukocytes,Ua: NEGATIVE
Nitrite: NEGATIVE
Protein, ur: 30 mg/dL — AB
Specific Gravity, Urine: 1.014 (ref 1.005–1.030)
pH: 6 (ref 5.0–8.0)

## 2021-01-24 IMAGING — CT CT RENAL STONE PROTOCOL
2 of 4 series · 15 of 46 positions shown, 17 images · non-contrast
Comparison: [DATE]

CLINICAL DATA: Severe abdominal/flank pain for several days. No
bowel movement in 5 days.

EXAM:
CT ABDOMEN AND PELVIS WITHOUT CONTRAST
TECHNIQUE: Multidetector CT imaging of the abdomen and pelvis was performed
following the standard protocol without IV contrast.

[Series 2: axial st · axial · 0.90mm/px · z∈[-1049,-549]mm · 12 of 114 slices shown, 14 images]
[im 7/114  soft-tissue]
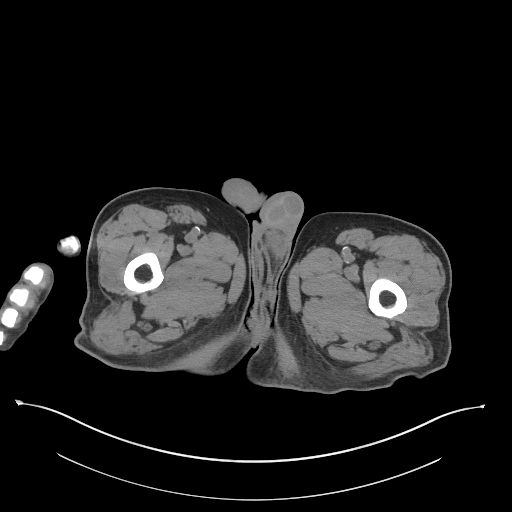
[im 7/114  bone]
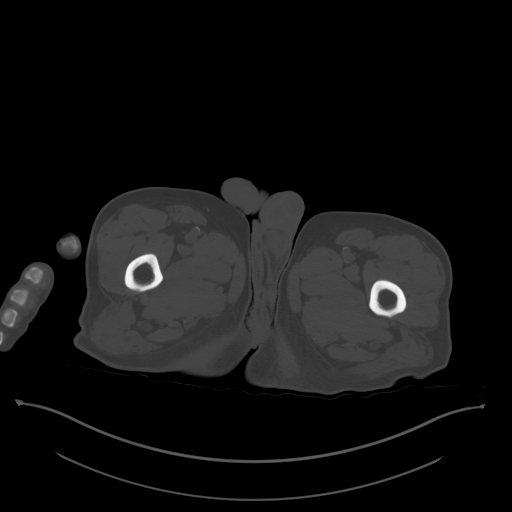
[im 19/114  soft-tissue]
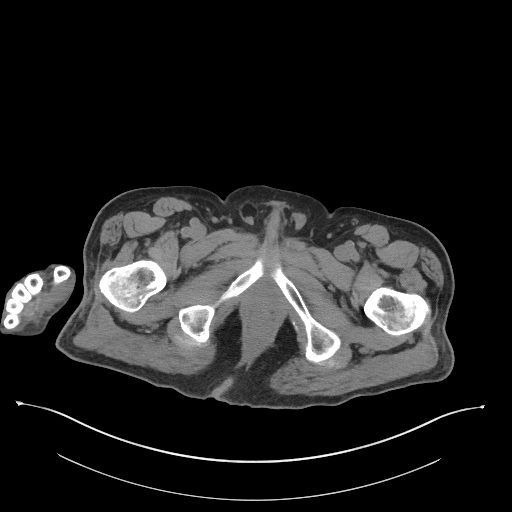
[im 26/114  soft-tissue]
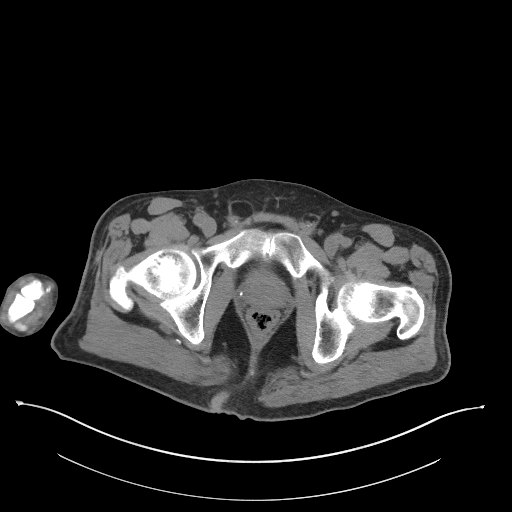
[im 32/114  soft-tissue]
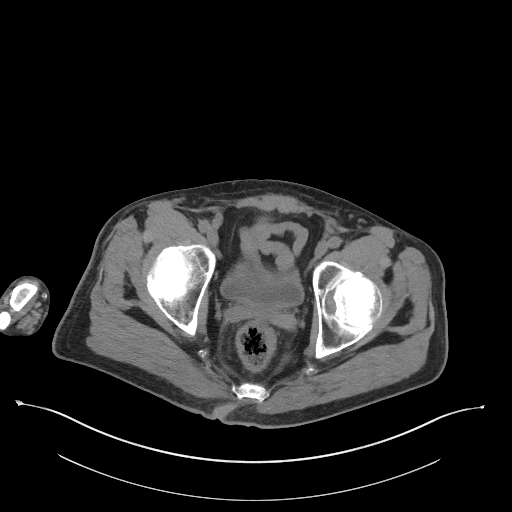
[im 44/114  soft-tissue]
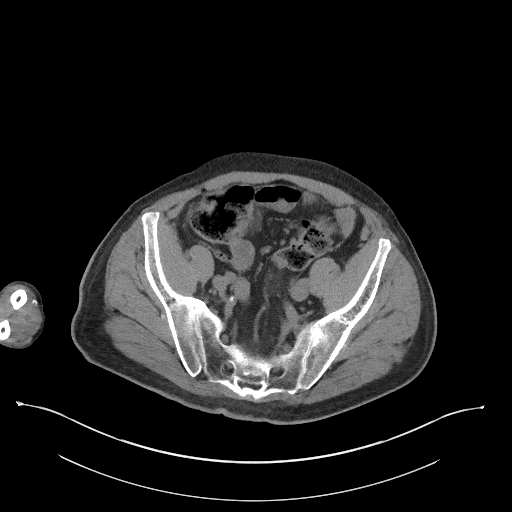
[im 51/114  soft-tissue]
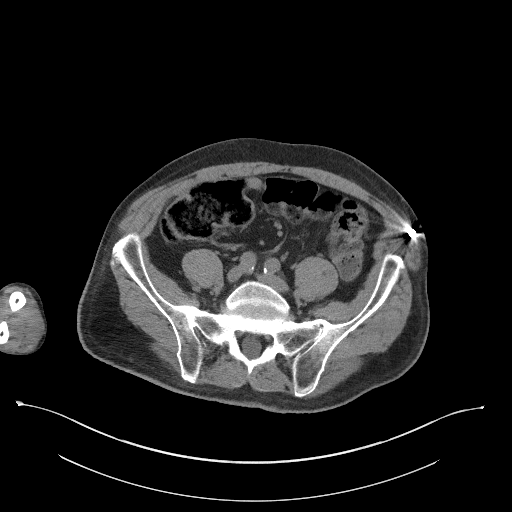
[im 63/114  soft-tissue]
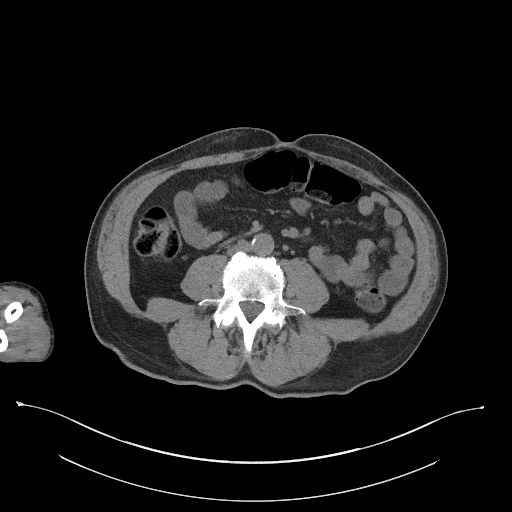
[im 70/114  soft-tissue]
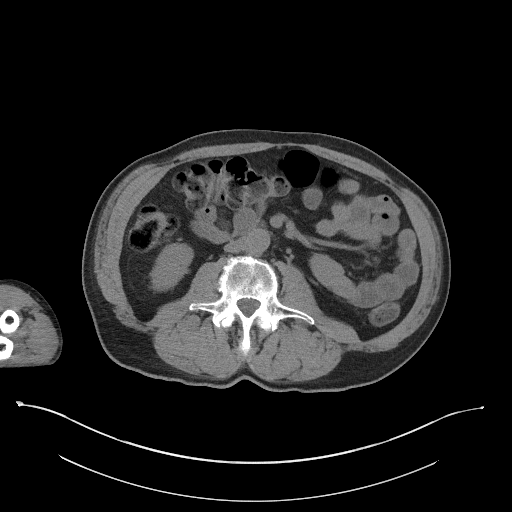
[im 82/114  soft-tissue]
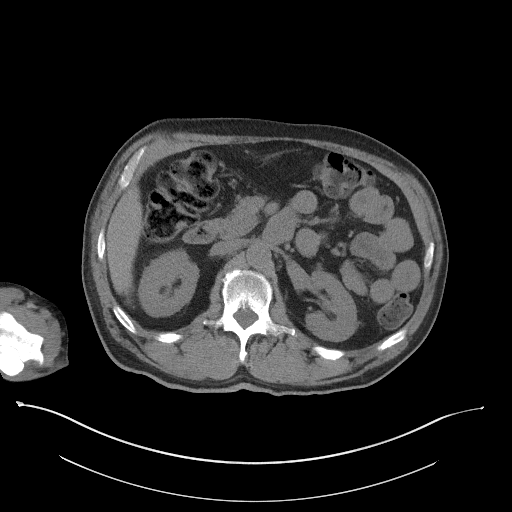
[im 82/114  bone]
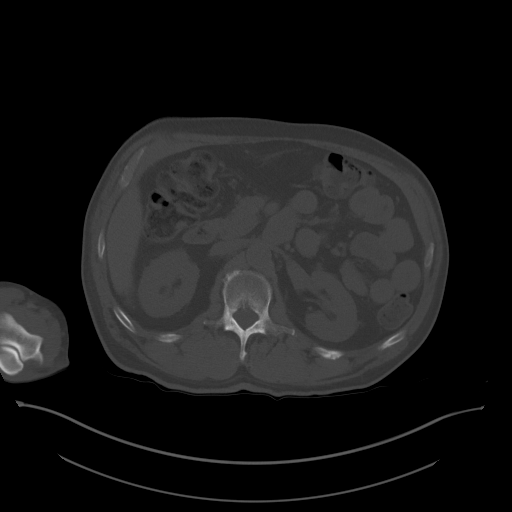
[im 88/114  soft-tissue]
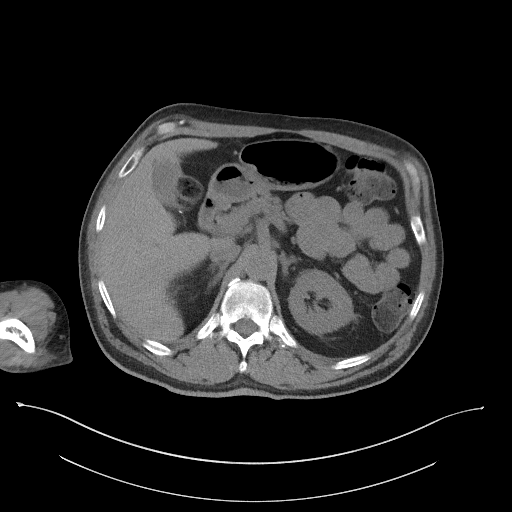
[im 95/114  soft-tissue]
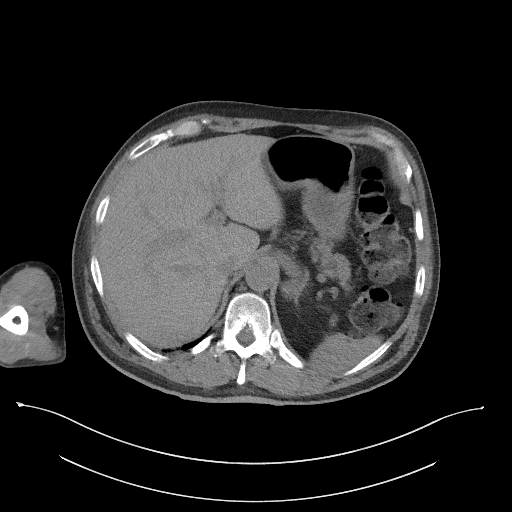
[im 107/114  soft-tissue]
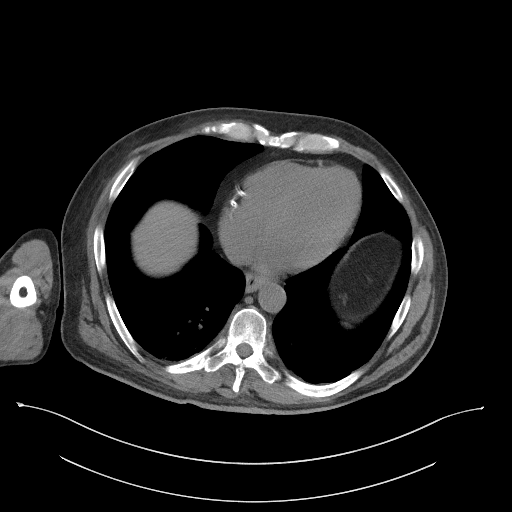

[Series 5: coronal st · coronal · 0.79mm/px · 3 of 108 slices shown]
[im 36/108  soft-tissue]
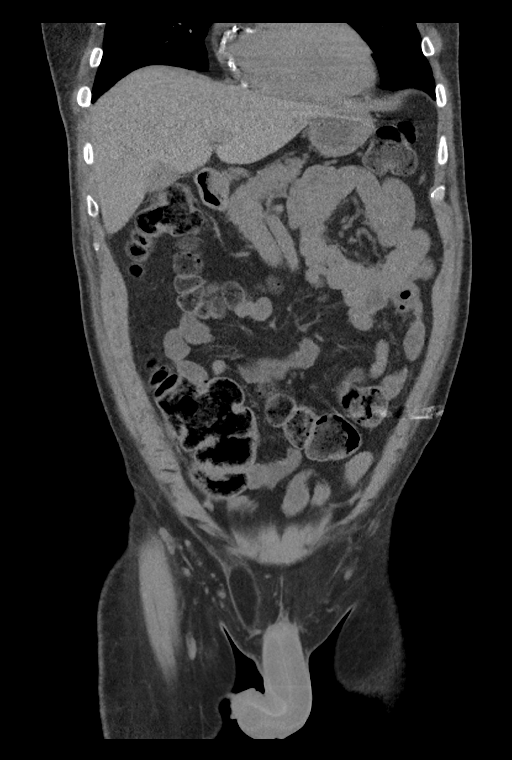
[im 48/108  soft-tissue]
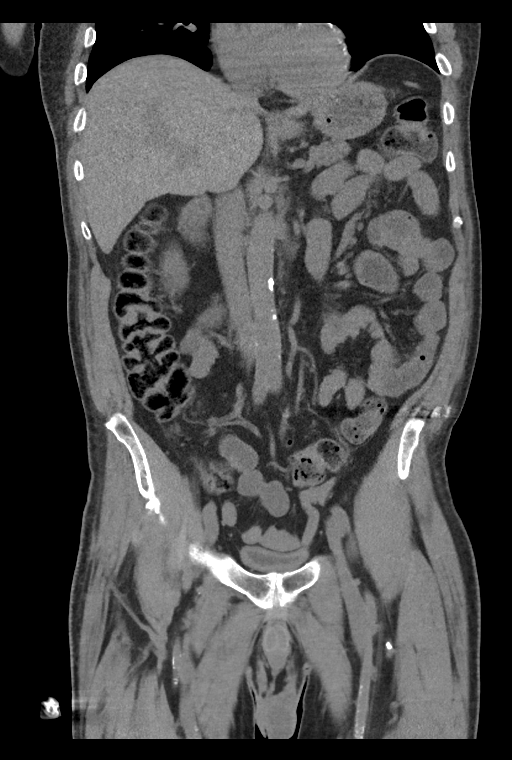
[im 60/108  soft-tissue]
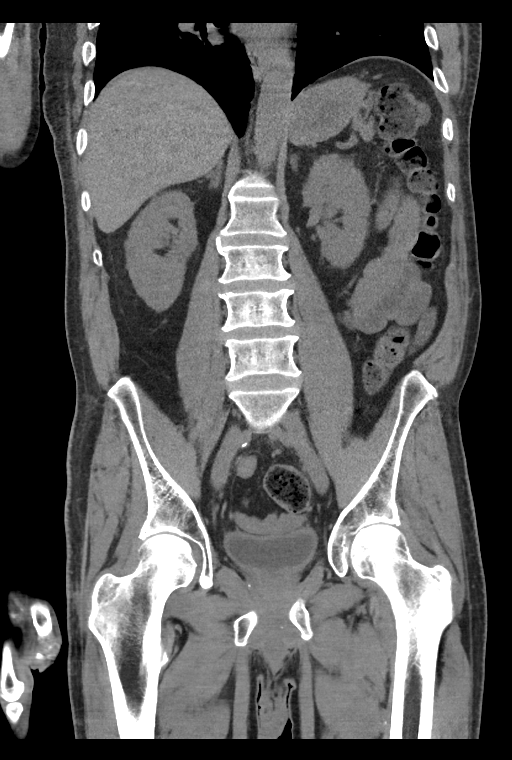

[15 of 46 positions shown; findings below may reference images not displayed]

FINDINGS: Lower chest: No acute abnormality.

Hepatobiliary: Unremarkable noncontrast appearance of the hepatic
parenchyma. Gallbladder is unremarkable. No biliary ductal dilation.

Pancreas: No pancreatic ductal dilation or evidence of acute
inflammation.

Spleen: Normal in size without focal abnormality.

Adrenals/Urinary Tract: Bilateral adrenal glands are unremarkable. 1
cm left upper pole renal cyst. No hydronephrosis. No renal, ureteral
or bladder calculi visualized. Urinary bladder is predominantly
decompressed limiting evaluation.

Stomach/Bowel: No enteric contrast was administered. Small hiatal
hernia otherwise the stomach is unremarkable for degree of
distension. No pathologic dilation of small or large bowel. The
appendix and terminal ileum appear normal. Moderate volume of formed
stool throughout colon, suggestive of constipation.

Vascular/Lymphatic: Aortic atherosclerosis without abdominal aortic
aneurysm.

Reproductive: Prostate is unremarkable.

Other: No abdominopelvic ascites.  No pneumoperitoneum.

Musculoskeletal: Mild thoracolumbar spondylosis. No acute osseous
abnormality.
IMPRESSION: 1. No acute abdominopelvic findings.
2. Moderate volume of formed stool throughout the colon, suggestive
of constipation.
3. Aortic Atherosclerosis ([13]-[13]).

## 2021-01-24 MED ORDER — MAGNESIUM CITRATE PO SOLN: 1.0000 | Freq: Once | ORAL | 1 refills | Status: AC

## 2021-01-24 MED ORDER — POLYETHYLENE GLYCOL 3350 17 G PO PACK
34.0000 g | PACK | Freq: Once | ORAL | Status: AC
  Administered 2021-01-24: 34 g via ORAL
  Filled 2021-01-24: qty 2

## 2021-01-24 MED ORDER — MELATONIN 3 MG PO TABS
6.0000 mg | ORAL_TABLET | Freq: Every day | ORAL | Status: DC
  Administered 2021-01-24: 6 mg via ORAL
  Filled 2021-01-24: qty 2

## 2021-01-24 MED ORDER — KETOROLAC TROMETHAMINE 30 MG/ML IJ SOLN
15.0000 mg | Freq: Once | INTRAMUSCULAR | Status: AC
  Administered 2021-01-24: 15 mg via INTRAVENOUS
  Filled 2021-01-24: qty 1

## 2021-01-24 NOTE — ED Notes (Signed)
Pt c/o severe abdominal pain and no BM for the last 5 days.

## 2021-01-24 NOTE — ED Notes (Signed)
Tray of food offered to pt.

## 2021-01-24 NOTE — ED Provider Notes (Signed)
[facility] EMERGENCY DEPARTMENT Provider Note   CSN: [id number] Arrival date & time: 01/24/21  1327     History Chief Complaint  Patient presents with   Abdominal Pain    NAPHTALI Alvarado is a 65 y.o. male. This is a 65 year old male with medical history significant for GERD, diabetes, hypertension, hypothyroidism Who presents today with chief complaint of flank and abdominal pain.  Patient has been seen a few times over the last couple months epigastric pain secondary to peptic ulcer disease.  However patient states that this pain feels worse, and is located more around the left flank that radiates down into the left lower quadrant. Sharp and started about 3 days ago and has worsened since then.  Patient denies urinary symptoms including dysuria, hematuria.  He d he complains of nausea without vomiting.  And he also notes a 5-day history of constipation that has not resolved with MiraLAX patient also mentions that he was diagnosed with a rib fracture as recently as a few weeks ago, however unable to verify that patient's chart.    Of note: Per chart review patient has a history of chronic cocaine use. He currently stays at a living facility.   Abdominal Pain Associated symptoms: constipation and nausea   Associated symptoms: no chest pain, no dysuria, no fever, no hematuria, no shortness of breath and no vomiting       Past Medical History:  Diagnosis Date   Coronary artery disease    Diabetes mellitus    Type II   Hyperlipidemia    Hypertension    Thyroid disease     Patient Active Problem List   Diagnosis Date Noted   Chest pain 10/21/2020   Depression 10/21/2020   Non compliance w medication regimen 10/21/2020   Cocaine abuse ([place]) 09/05/2020   Adjustment disorder with emotional disturbance 09/05/2020   Hypertensive urgency 03/08/2015   Essential hypertension 02/14/2011   CAD (coronary artery disease) 08/13/2010   Stented coronary artery 08/13/2010   Diabetes  mellitus ([place]) 08/13/2010    Past Surgical History:  Procedure Laterality Date   CARDIAC CATHETERIZATION  08/06/2010   Bare metal stent placed in RCA.   HAND SURGERY     left       Family History  Problem Relation Age of Onset   Heart attack Mother     Social History   Tobacco Use   Smoking status: Never   Smokeless tobacco: Never  Vaping Use   Vaping Use: Never used  Substance Use Topics   Alcohol use: No   Drug use: Yes    Comment: crack    Home Medications Prior to Admission medications   Medication Sig Start Date End Date Taking? Authorizing Provider  aspirin 81 MG EC tablet Take 1 tablet (81 mg total) by mouth daily. Swallow whole. 10/23/20   Max Sane, MD  atorvastatin (LIPITOR) 40 MG tablet Take 1 tablet (40 mg total) by mouth every evening. 10/23/20 11/22/20  Max Sane, MD  carvedilol (COREG) 3.125 MG tablet Take 1 tablet (3.125 mg total) by mouth 2 (two) times daily with a meal. 10/23/20 11/22/20  Max Sane, MD  citalopram (CELEXA) 20 MG tablet Take 1 tablet (20 mg total) by mouth daily. 10/23/20 11/22/20  Max Sane, MD  gabapentin (NEURONTIN) 300 MG capsule Take 3 capsules (900 mg total) by mouth 2 (two) times daily. 10/23/20   Max Sane, MD  insulin aspart (NOVOLOG) 100 UNIT/ML FlexPen Inject 3 Units into the skin 3 (three)  times daily with meals. 10/23/20   Delfino Lovett, MD  insulin detemir (LEVEMIR) 100 UNIT/ML injection Inject 0.3 mLs (30 Units total) into the skin 2 (two) times daily. 10/23/20   Delfino Lovett, MD  insulin glargine (LANTUS) 100 UNIT/ML injection Inject 0.3 mLs (30 Units total) into the skin 2 (two) times daily. 10/23/20   Delfino Lovett, MD  insulin lispro (HUMALOG) 100 UNIT/ML injection Inject 0.03 mLs (3 Units total) into the skin 3 (three) times daily with meals. 10/23/20   Delfino Lovett, MD  isosorbide mononitrate (IMDUR) 30 MG 24 hr tablet Take 1 tablet (30 mg total) by mouth daily. 10/23/20 11/22/20  Delfino Lovett, MD  levothyroxine (SYNTHROID) 50 MCG tablet Take  1 tablet (50 mcg total) by mouth daily. 10/23/20   Delfino Lovett, MD  oxyCODONE-acetaminophen (PERCOCET) 7.5-325 MG tablet Take 1 tablet by mouth 2 (two) times daily as needed for severe pain. 10/23/20   Delfino Lovett, MD    Allergies    Ace inhibitors  Review of Systems   Review of Systems  Constitutional:  Negative for appetite change and fever.  HENT:  Negative for sneezing.   Eyes:  Negative for pain.  Respiratory:  Negative for shortness of breath.   Cardiovascular:  Negative for chest pain.  Gastrointestinal:  Positive for abdominal pain, constipation and nausea. Negative for vomiting.  Genitourinary:  Positive for flank pain. Negative for decreased urine volume, dysuria and hematuria.  Musculoskeletal:  Negative for back pain.  Skin:  Negative for wound.  Neurological:  Negative for headaches.  All other systems reviewed and are negative.  Physical Exam Updated Vital Signs BP 120/78   Pulse 73   Temp 98.2 F (36.8 C) (Oral)   Resp 18   Ht 6\' 2"  (1.88 m)   Wt 79.4 kg   SpO2 100%   BMI 22.47 kg/m   Physical Exam  ED Results / Procedures / Treatments   Labs (all labs ordered are listed, but only abnormal results are displayed) Labs Reviewed  CBC WITH DIFFERENTIAL/PLATELET  COMPREHENSIVE METABOLIC PANEL  URINALYSIS, ROUTINE W REFLEX MICROSCOPIC    EKG None  Radiology CT Renal Stone Study  Result Date: 01/24/2021 CLINICAL DATA:  Severe abdominal/flank pain for several days. No bowel movement in 5 days. EXAM: CT ABDOMEN AND PELVIS WITHOUT CONTRAST TECHNIQUE: Multidetector CT imaging of the abdomen and pelvis was performed following the standard protocol without IV contrast. COMPARISON:  January 11, 2021 FINDINGS: Lower chest: No acute abnormality. Hepatobiliary: Unremarkable noncontrast appearance of the hepatic parenchyma. Gallbladder is unremarkable. No biliary ductal dilation. Pancreas: No pancreatic ductal dilation or evidence of acute inflammation. Spleen: Normal in  size without focal abnormality. Adrenals/Urinary Tract: Bilateral adrenal glands are unremarkable. 1 cm left upper pole renal cyst. No hydronephrosis. No renal, ureteral or bladder calculi visualized. Urinary bladder is predominantly decompressed limiting evaluation. Stomach/Bowel: No enteric contrast was administered. Small hiatal hernia otherwise the stomach is unremarkable for degree of distension. No pathologic dilation of small or large bowel. The appendix and terminal ileum appear normal. Moderate volume of formed stool throughout colon, suggestive of constipation. Vascular/Lymphatic: Aortic atherosclerosis without abdominal aortic aneurysm. Reproductive: Prostate is unremarkable. Other: No abdominopelvic ascites.  No pneumoperitoneum. Musculoskeletal: Mild thoracolumbar spondylosis. No acute osseous abnormality. IMPRESSION: 1. No acute abdominopelvic findings. 2. Moderate volume of formed stool throughout the colon, suggestive of constipation. 3. Aortic Atherosclerosis (ICD10-I70.0). Electronically Signed   By: January 13, 2021 M.D.   On: 01/24/2021 16:33    Procedures Procedures  Medications Ordered in ED Medications  ketorolac (TORADOL) 30 MG/ML injection 15 mg (has no administration in time range)    ED Course  I have reviewed the triage vital signs and the nursing notes.  Pertinent labs & imaging results that were available during my care of the patient were reviewed by me and considered in my medical decision making (see chart for details).    MDM Rules/Calculators/A&P                           65 year old male presenting with a chief complaint of left-sided flank pain that started about 3 days prior to arrival.  Based on quality and location of symptoms, initial suspicion for kidney stone vs constipation.  Patient given Toradol without any relief.  All labs and imaging ordered in the ED were reviewed and interpreted by myself.  Labs ordered in the ED: Urinalysis:  unremarkable CMP: unremarkable CBC: unremarkable  Imaging ordered in the ED: CT renal stone: negative for renal stone; moderate fecal burden  Patient's likely cause of symptoms secondary to constipation.  Currently there is a recall on mag citrate, so discharge patient home with instructions to double his dose of MiraLAX for today, and then progressively increase the each day until he has a bowel movement before tapering back.  First dose given in ED today.  Final Clinical Impression(s) / ED Diagnoses Final diagnoses:  Constipation, unspecified constipation type  Left upper quadrant abdominal pain    Rx / DC Orders ED Discharge Orders          Ordered    magnesium citrate SOLN   Once        01/24/21 1813             Rodena Piety 01/24/21 2137    Noemi Chapel, MD 01/24/21 2224

## 2021-01-24 NOTE — ED Triage Notes (Addendum)
Pt to the ED with [facility] from the [facility] with severe abdominal pain for the last several days. Pt states he has not had a bowel movement in the last five days.  EMS reports the staff at [facility] has been administering mirilax with no resolution.

## 2021-01-24 NOTE — ED Notes (Signed)
[facility] unable to pick up pt. Staff member will try to call family to pick up pt. Until then Pt is waiting on EMS return.

## 2021-01-24 NOTE — ED Notes (Signed)
Meal given

## 2021-01-24 NOTE — Discharge Instructions (Addendum)
You were seen today for chief complaint of left-sided abdominal pain.  Your CT scan was negative for renal causes of this pain, however there was a large amount of stool seen in your colon.  Your blood work today was benign we believe the cause of your abdominal pain is secondary to your severe constipation.   Your constipation, you may double the dose of MiraLAX that you have currently been taking ( take 2 scoops) and take it twice a day.  If you have still not had a bowel movement by the next day, you may triple the dose (3 scoops) please return if you develop worsening pain or symptoms.  Continue increasing your dose by 1 scoop every day until you have a bowel movement and then you may start to taper back.  Please return if symptoms significantly worsen.

## 2021-01-25 DIAGNOSIS — K5900 Constipation, unspecified: Secondary | ICD-10-CM | POA: Diagnosis not present

## 2021-01-25 MED ORDER — POLYETHYLENE GLYCOL 3350 17 G PO PACK
17.0000 g | PACK | Freq: Once | ORAL | Status: AC
  Administered 2021-01-25: 17 g via ORAL
  Filled 2021-01-25: qty 1

## 2021-01-25 NOTE — ED Notes (Signed)
Pt refused vitals 

## 2021-01-25 NOTE — ED Notes (Signed)
Pt refused v/s..

## 2021-06-13 ENCOUNTER — Emergency Department
Admission: EM | Admit: 2021-06-13 | Discharge: 2021-06-14 | Disposition: A | Discharge status: Home / Self Care | Payer: Medicare Other | Attending: Emergency Medicine | Admitting: Emergency Medicine

## 2021-06-13 ENCOUNTER — Other Ambulatory Visit: Payer: Self-pay

## 2021-06-13 ENCOUNTER — Encounter: Payer: Self-pay | Admitting: Emergency Medicine

## 2021-06-13 ENCOUNTER — Emergency Department: Payer: Medicare Other

## 2021-06-13 DIAGNOSIS — I2510 Atherosclerotic heart disease of native coronary artery without angina pectoris: Secondary | ICD-10-CM | POA: Insufficient documentation

## 2021-06-13 DIAGNOSIS — R1013 Epigastric pain: Secondary | ICD-10-CM

## 2021-06-13 DIAGNOSIS — R197 Diarrhea, unspecified: Secondary | ICD-10-CM | POA: Diagnosis not present

## 2021-06-13 DIAGNOSIS — K4090 Unilateral inguinal hernia, without obstruction or gangrene, not specified as recurrent: Secondary | ICD-10-CM | POA: Insufficient documentation

## 2021-06-13 DIAGNOSIS — R531 Weakness: Secondary | ICD-10-CM | POA: Insufficient documentation

## 2021-06-13 DIAGNOSIS — E119 Type 2 diabetes mellitus without complications: Secondary | ICD-10-CM | POA: Insufficient documentation

## 2021-06-13 DIAGNOSIS — E860 Dehydration: Secondary | ICD-10-CM | POA: Insufficient documentation

## 2021-06-13 DIAGNOSIS — R112 Nausea with vomiting, unspecified: Secondary | ICD-10-CM

## 2021-06-13 DIAGNOSIS — I10 Essential (primary) hypertension: Secondary | ICD-10-CM | POA: Diagnosis not present

## 2021-06-13 LAB — CBC
HCT: 33.1 % — ABNORMAL LOW (ref 39.0–52.0)
Hemoglobin: 11.3 g/dL — ABNORMAL LOW (ref 13.0–17.0)
MCH: 30.2 pg (ref 26.0–34.0)
MCHC: 34.1 g/dL (ref 30.0–36.0)
MCV: 88.5 fL (ref 80.0–100.0)
Platelets: 258 10*3/uL (ref 150–400)
RBC: 3.74 MIL/uL — ABNORMAL LOW (ref 4.22–5.81)
RDW: 12.2 % (ref 11.5–15.5)
WBC: 8.2 10*3/uL (ref 4.0–10.5)
nRBC: 0 % (ref 0.0–0.2)

## 2021-06-13 LAB — COMPREHENSIVE METABOLIC PANEL
ALT: 22 U/L (ref 0–44)
AST: 23 U/L (ref 15–41)
Albumin: 4.4 g/dL (ref 3.5–5.0)
Alkaline Phosphatase: 67 U/L (ref 38–126)
Anion gap: 12 (ref 5–15)
BUN: 25 mg/dL — ABNORMAL HIGH (ref 8–23)
CO2: 23 mmol/L (ref 22–32)
Calcium: 9.5 mg/dL (ref 8.9–10.3)
Chloride: 102 mmol/L (ref 98–111)
Creatinine, Ser: 1.47 mg/dL — ABNORMAL HIGH (ref 0.61–1.24)
GFR, Estimated: 52 mL/min — ABNORMAL LOW (ref >60)
Glucose, Bld: 171 mg/dL — ABNORMAL HIGH (ref 70–99)
Potassium: 4.5 mmol/L (ref 3.5–5.1)
Sodium: 137 mmol/L (ref 135–145)
Total Bilirubin: 0.9 mg/dL (ref 0.3–1.2)
Total Protein: 7.9 g/dL (ref 6.5–8.1)

## 2021-06-13 LAB — LIPASE, BLOOD: Lipase: 35 U/L (ref 11–51)

## 2021-06-13 IMAGING — CT CT ABD-PELV W/O CM
2 of 4 series · 16 of 46 positions shown, 18 images · non-contrast
Comparison: [DATE]

CLINICAL DATA: Nausea and vomiting



[Series 2: ap without · axial · non-contrast · 0.80mm/px · z∈[-1229,-729]mm · 13 of 112 slices shown, 15 images]
[im 6/112  soft-tissue]
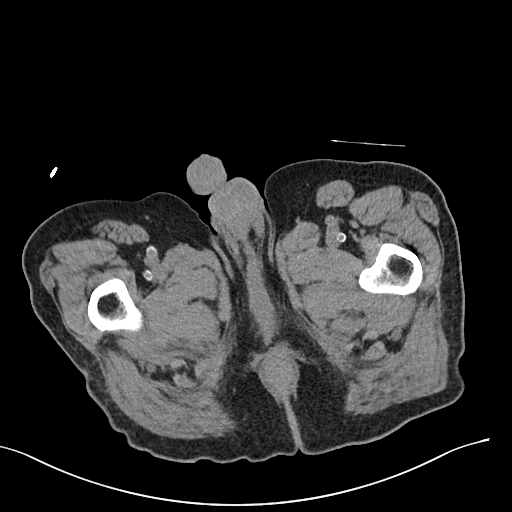
[im 6/112  bone]
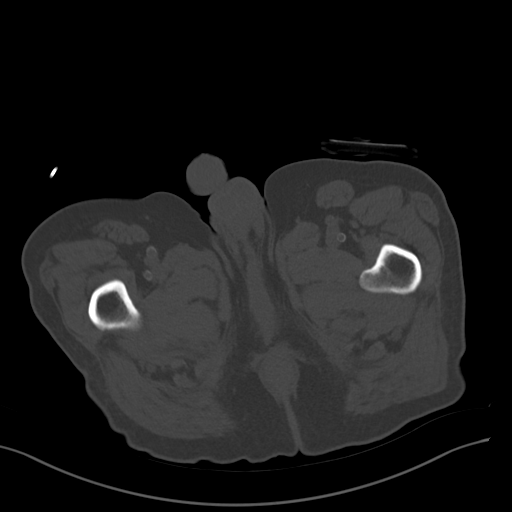
[im 18/112  soft-tissue]
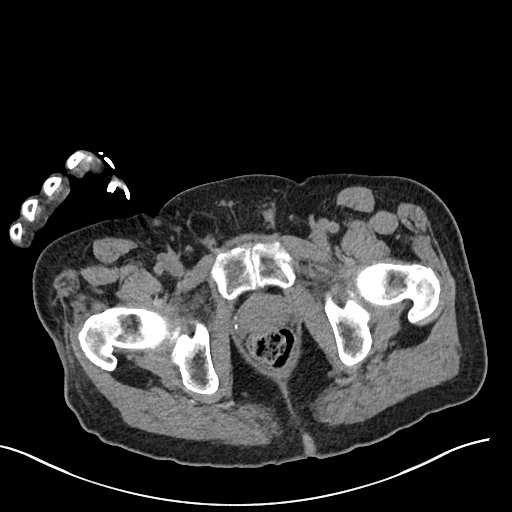
[im 24/112  soft-tissue]
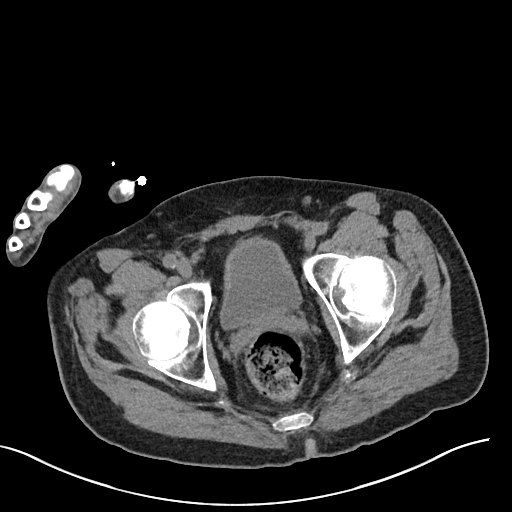
[im 30/112  soft-tissue]
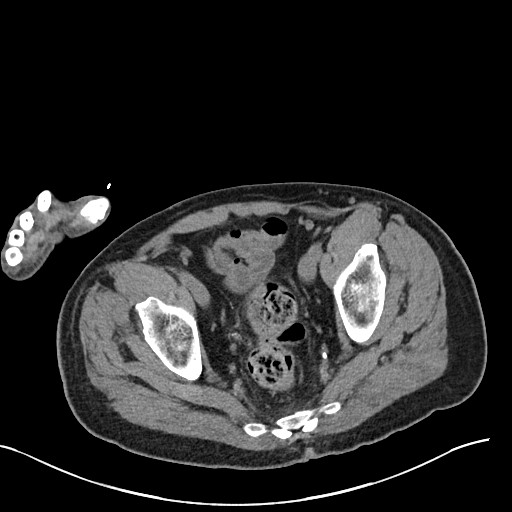
[im 41/112  soft-tissue]
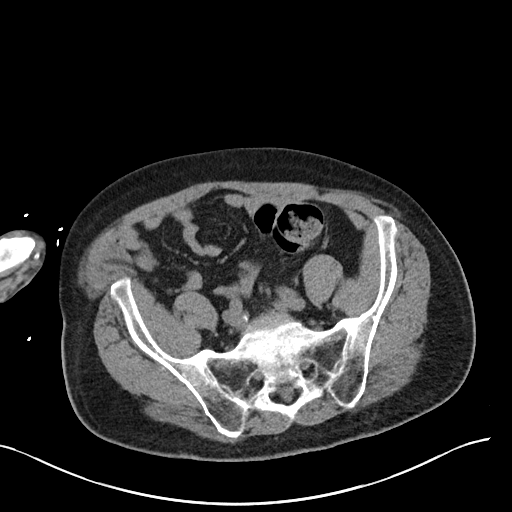
[im 47/112  soft-tissue]
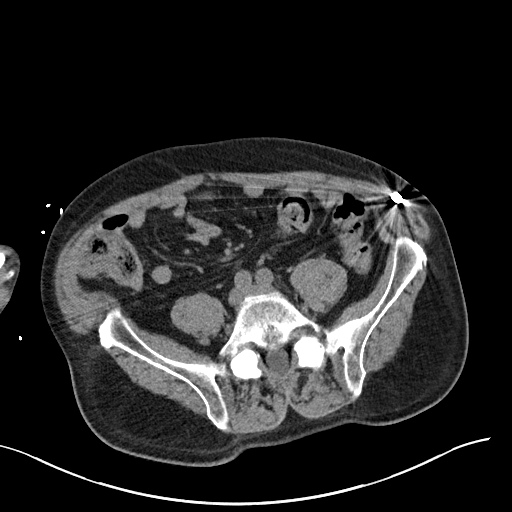
[im 59/112  soft-tissue]
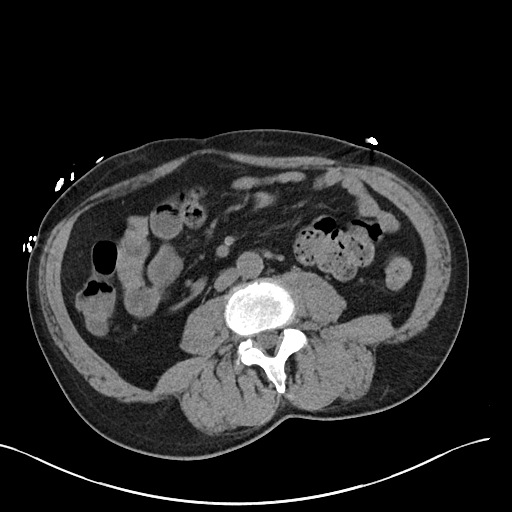
[im 65/112  soft-tissue]
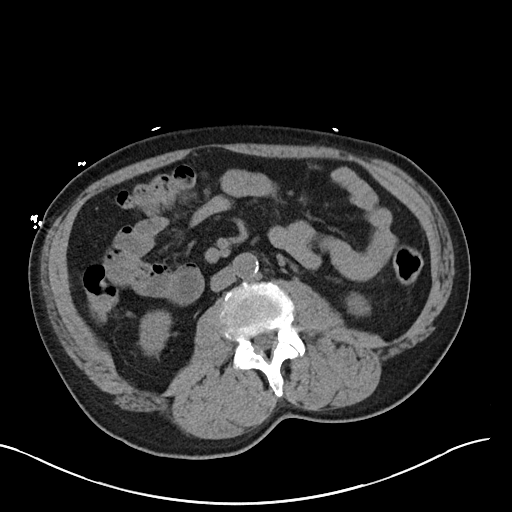
[im 71/112  soft-tissue]
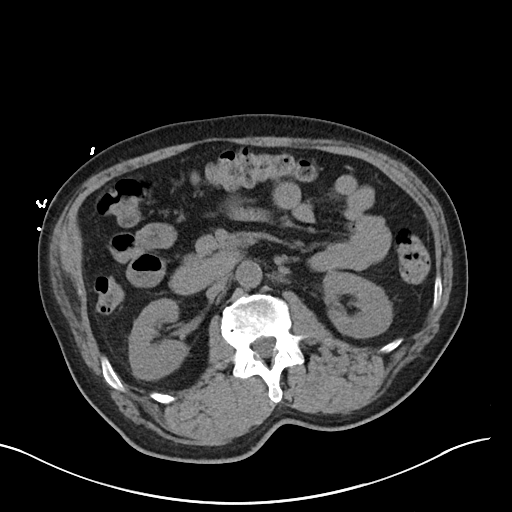
[im 71/112  bone]
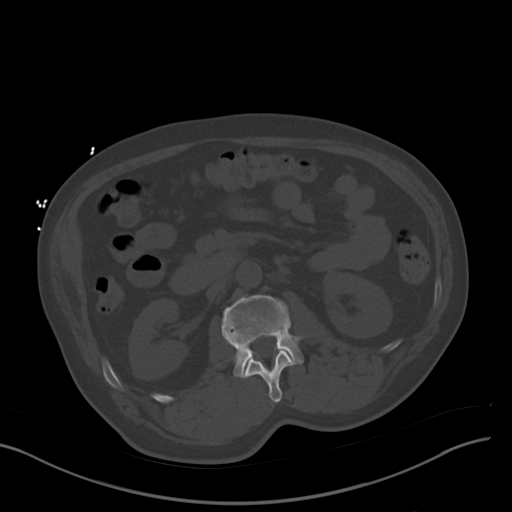
[im 82/112  soft-tissue]
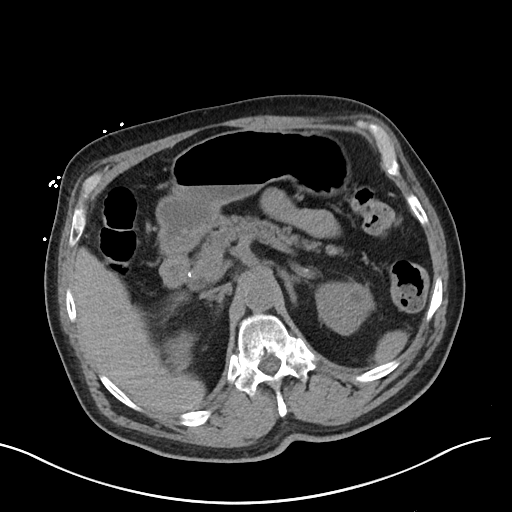
[im 88/112  soft-tissue]
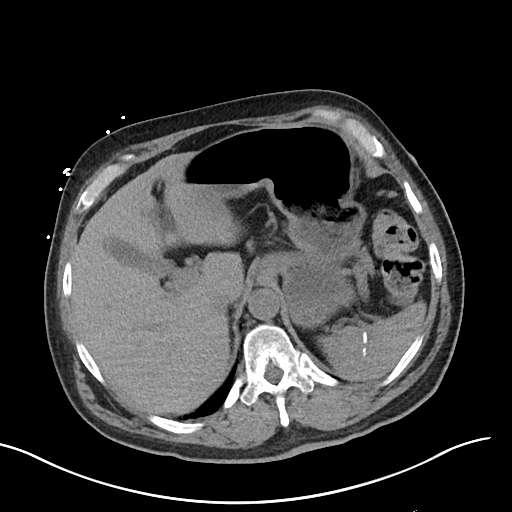
[im 94/112  soft-tissue]
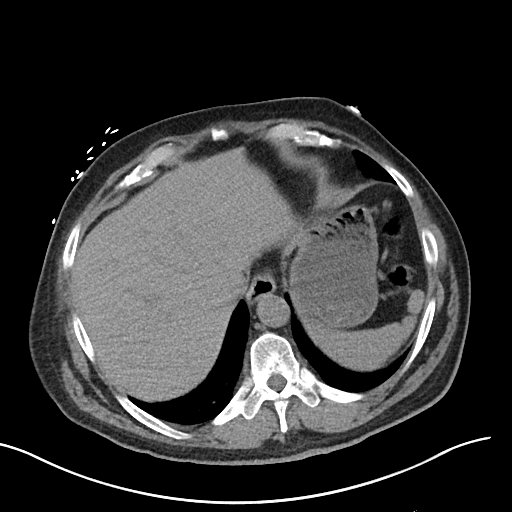
[im 106/112  soft-tissue]
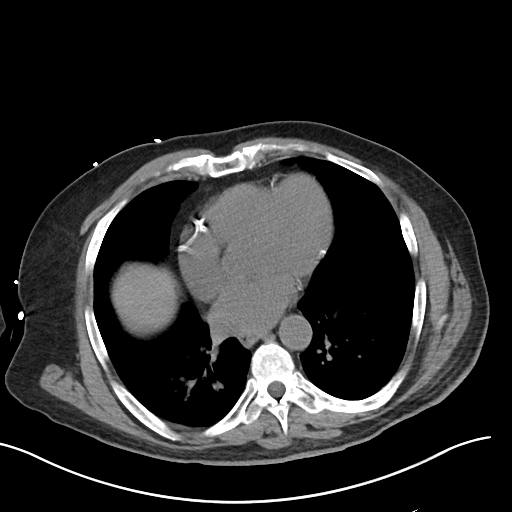

[Series 5: cor · coronal · 0.75mm/px · 3 of 101 slices shown]
[im 34/101  soft-tissue]
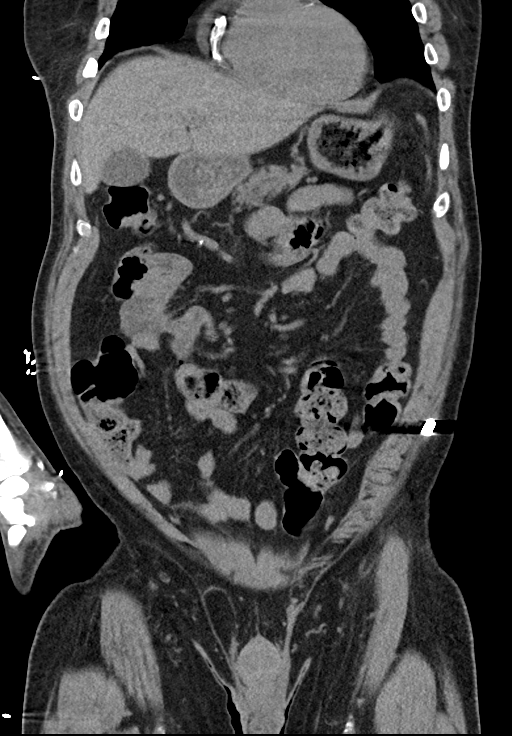
[im 45/101  soft-tissue]
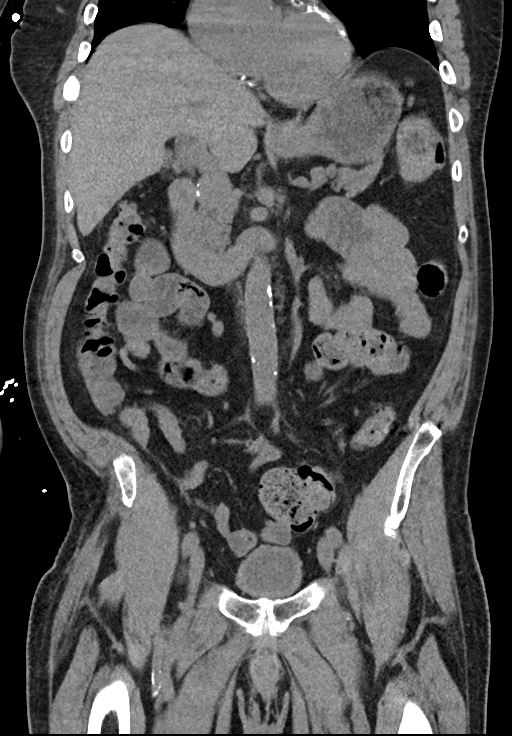
[im 56/101  soft-tissue]
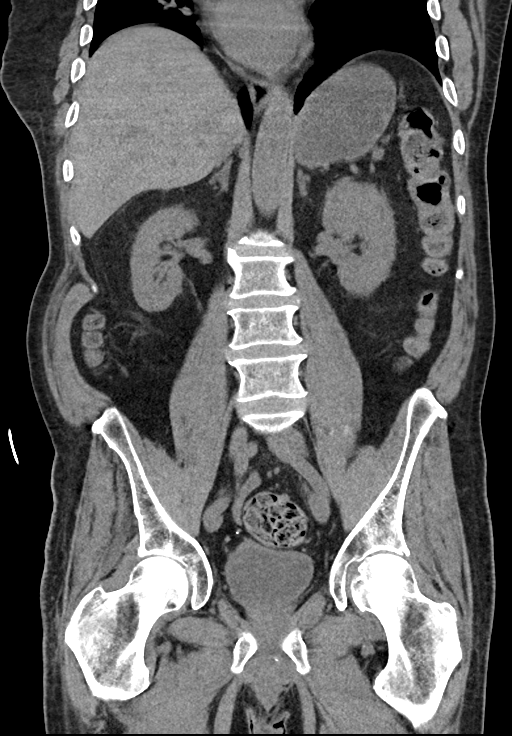

[16 of 46 positions shown; findings below may reference images not displayed]

FINDINGS: Lower chest: Hypoventilatory changes at the lung bases. No acute
pleural or parenchymal lung disease. Diffuse coronary artery
atherosclerosis.

Hepatobiliary: Unremarkable unenhanced appearance of the liver and
gallbladder.

Pancreas: Unremarkable unenhanced appearance.

Spleen: Unremarkable unenhanced appearance.

Adrenals/Urinary Tract: No urinary tract calculi or obstructive
uropathy within either kidney. The adrenals and bladder are
unremarkable.

Stomach/Bowel: No bowel obstruction or ileus. Normal appendix right
lower quadrant. No bowel wall thickening or inflammatory change.

Vascular/Lymphatic: Aortic atherosclerosis. No enlarged abdominal or
pelvic lymph nodes.

Reproductive: Prostate is unremarkable.

Other: No free fluid or free gas. Small fat containing right
inguinal hernia.

Musculoskeletal: No acute or destructive bony lesions. Reconstructed
images demonstrate no additional findings.
IMPRESSION: 1. No acute intra-abdominal or intrapelvic process.
2. Small fat containing right inguinal hernia.
3. Aortic Atherosclerosis ([TG]-[TG]). Coronary artery
atherosclerosis.

## 2021-06-13 MED ORDER — ONDANSETRON HCL 4 MG/2ML IJ SOLN
4.0000 mg | Freq: Once | INTRAMUSCULAR | Status: AC
  Administered 2021-06-13: 4 mg via INTRAVENOUS
  Filled 2021-06-13: qty 2

## 2021-06-13 MED ORDER — ONDANSETRON 8 MG PO TBDP: 8.0000 mg | ORAL_TABLET | Freq: Three times a day (TID) | ORAL | 0 refills | Status: DC | PRN

## 2021-06-13 MED ORDER — LIDOCAINE VISCOUS HCL 2 % MT SOLN
15.0000 mL | Freq: Once | OROMUCOSAL | Status: AC
  Administered 2021-06-13: 15 mL via ORAL
  Filled 2021-06-13: qty 15

## 2021-06-13 MED ORDER — ALUM & MAG HYDROXIDE-SIMETH 200-200-20 MG/5ML PO SUSP
30.0000 mL | Freq: Once | ORAL | Status: AC
  Administered 2021-06-13: 30 mL via ORAL
  Filled 2021-06-13: qty 30

## 2021-06-13 MED ORDER — SODIUM CHLORIDE 0.9 % IV BOLUS
1000.0000 mL | Freq: Once | INTRAVENOUS | Status: AC
  Administered 2021-06-13: 1000 mL via INTRAVENOUS

## 2021-06-13 MED ORDER — MORPHINE SULFATE (PF) 4 MG/ML IV SOLN
4.0000 mg | Freq: Once | INTRAVENOUS | Status: AC
  Administered 2021-06-13: 4 mg via INTRAVENOUS
  Filled 2021-06-13: qty 1

## 2021-06-13 NOTE — ED Notes (Signed)
Pt attempted to urinate in urinal, was not able to go.   ?

## 2021-06-13 NOTE — ED Notes (Signed)
ED secretary Rivka Barbara contacting EMS for address that pt was picked up from.  Per son address is somewhere on [address] in [place] and sister's name is Trixie Dredge. ?

## 2021-06-13 NOTE — ED Triage Notes (Signed)
Pt via EMS from home. Pt c/o epigastric pain, NVD for the past couple of days. Pt states he also had a fall last night from dizziness and cannot remember if he hit his head. Unknown if he is on blood thinners. Denies any abd surgeries. Pt is A&OX4 and NAD.  ?

## 2021-06-13 NOTE — ED Triage Notes (Signed)
Pt in via EMS from home with c/o epigastric pain for several days. HR 84, 94% RA, 164/100. Pt reports has been missing some of his meds but unsure which one, it may be for HTN. CBG 159, 98.7 temp. Pt with hx of stroke and has difficulty walking without a walker as a result.  ?

## 2021-06-13 NOTE — ED Notes (Signed)
This RN was able to get a hold of pt son Jillyn Hidden, he stated that the pt is visiting sister in [place] and that is where EMS picked him up.  Pt stated that he is looking foe his sister's phone number but sister can not pick him up b/c 'she's old and has a handicapped son.' ?

## 2021-06-13 NOTE — ED Notes (Signed)
Pt states 'a little aggravating nausea and reports last emesis at approx 1700 today. ?

## 2021-06-13 NOTE — ED Notes (Signed)
This RN attempted a call to Cotton,Gary (Son) at  ?[phone] [facility].) No answer and mailbox full. ?

## 2021-06-13 NOTE — ED Notes (Signed)
Bed alarm on, yellow band applied to R wrist. ?

## 2021-06-13 NOTE — ED Provider Notes (Signed)
? ?[facility] ?Provider Note ? ? Event Date/Time  ? First MD Initiated Contact with Patient 06/13/21 2040   ?  (approximate) ?History  ?Abdominal Pain ? ?HPI ?Steve Alvarado is a 66 y.o. male with a past medical history of CAD, type 2 diabetes, hypertension, and depression who presents for epigastric pain in the setting of nausea/vomiting/diarrhea for the past 2 days.  Patient states that he had a fall last night due to bilateral lower extremity weakness due to dehydration.  Patient states that he has not been able to keep too much liquid down and is concerned that he has becoming dehydrated.  Patient states that he has now had to walk with a walker around his house due to his lower extremity weakness.  Patient describes burning, 8/10, nonradiating epigastric pain that is associated with nausea and vomiting.  Patient denies any hematemesis, hematochezia, chest pain, shortness of breath, or numbness/paresthesias in any extremity. ?Physical Exam  ?Triage Vital Signs: ?ED Triage Vitals  ?Enc Vitals Group  ?   BP 06/13/21 1856 100/70  ?   Pulse Rate 06/13/21 1856 85  ?   Resp 06/13/21 1856 16  ?   Temp 06/13/21 1856 97.6 ?F (36.4 ?C)  ?   Temp Source 06/13/21 1856 Oral  ?   SpO2 06/13/21 1856 96 %  ?   Weight 06/13/21 1850 200 lb (90.7 kg)  ?   Height 06/13/21 1850 6\' 2"  (1.88 m)  ?   Head Circumference --   ?   Peak Flow --   ?   Pain Score 06/13/21 1850 8  ?   Pain Loc --   ?   Pain Edu? --   ?   Excl. in GC? --   ? ?Most recent vital signs: ?Vitals:  ? 06/13/21 2108 06/13/21 2207  ?BP:  (!) 149/94  ?Pulse:  75  ?Resp:  20  ?Temp:    ?SpO2: 100% 98%  ? ?General: Awake, oriented x4. ?CV:  Good peripheral perfusion.  ?Resp:  Normal effort.  ?Abd:  No distention.  ?Other:  Elderly African-American male laying in bed in no distress ?ED Results / Procedures / Treatments  ?Labs ?(all labs ordered are listed, but only abnormal results are displayed) ?Labs Reviewed  ?COMPREHENSIVE METABOLIC PANEL -  Abnormal; Notable for the following components:  ?    Result Value  ? Glucose, Bld 171 (*)   ? BUN 25 (*)   ? Creatinine, Ser 1.47 (*)   ? GFR, Estimated 52 (*)   ? All other components within normal limits  ?CBC - Abnormal; Notable for the following components:  ? RBC 3.74 (*)   ? Hemoglobin 11.3 (*)   ? HCT 33.1 (*)   ? All other components within normal limits  ?LIPASE, BLOOD  ?URINALYSIS, ROUTINE W REFLEX MICROSCOPIC  ? ?EKG ?ED ECG REPORT ?I, Merwyn KatosEvan K Moani Weipert, the attending physician, personally viewed and interpreted this ECG. ?Date: 06/13/2021 ?EKG Time: 1853 ?Rate: 86 ?Rhythm: normal sinus rhythm ?QRS Axis: normal ?Intervals: normal ?ST/T Wave abnormalities: normal ?Narrative Interpretation: no evidence of acute ischemia ?RADIOLOGY ?ED MD interpretation: CT of the abdomen and pelvis with IV contrast as interpreted by me shows no evidence of acute intra-abdominal or intrapelvic process.  There is a small fat-containing right inguinal hernia and signs of aortic atherosclerosis as well as coronary artery atherosclerosis found incidentally ?-Agree with radiology assessment ?Official radiology report(s): ?CT Abdomen Pelvis Wo Contrast ? ?Result Date: 06/13/2021 ?CLINICAL DATA:  Nausea and vomiting EXAM: CT ABDOMEN AND PELVIS WITHOUT CONTRAST TECHNIQUE: Multidetector CT imaging of the abdomen and pelvis was performed following the standard protocol without IV contrast. RADIATION DOSE REDUCTION: This exam was performed according to the departmental dose-optimization program which includes automated exposure control, adjustment of the mA and/or kV according to patient size and/or use of iterative reconstruction technique. COMPARISON:  01/24/2021 FINDINGS: Lower chest: Hypoventilatory changes at the lung bases. No acute pleural or parenchymal lung disease. Diffuse coronary artery atherosclerosis. Hepatobiliary: Unremarkable unenhanced appearance of the liver and gallbladder. Pancreas: Unremarkable unenhanced appearance.  Spleen: Unremarkable unenhanced appearance. Adrenals/Urinary Tract: No urinary tract calculi or obstructive uropathy within either kidney. The adrenals and bladder are unremarkable. Stomach/Bowel: No bowel obstruction or ileus. Normal appendix right lower quadrant. No bowel wall thickening or inflammatory change. Vascular/Lymphatic: Aortic atherosclerosis. No enlarged abdominal or pelvic lymph nodes. Reproductive: Prostate is unremarkable. Other: No free fluid or free gas. Small fat containing right inguinal hernia. Musculoskeletal: No acute or destructive bony lesions. Reconstructed images demonstrate no additional findings. IMPRESSION: 1. No acute intra-abdominal or intrapelvic process. 2. Small fat containing right inguinal hernia. 3. Aortic Atherosclerosis (ICD10-I70.0). Coronary artery atherosclerosis. Electronically Signed   By: Sharlet Salina M.D.   On: 06/13/2021 21:30   ?PROCEDURES: ?Critical Care performed: No ?.1-3 Lead EKG Interpretation ?Performed by: Merwyn Katos, MD ?Authorized by: Merwyn Katos, MD  ? ?  Interpretation: normal   ?  ECG rate:  74 ?  ECG rate assessment: normal   ?  Rhythm: sinus rhythm   ?  Ectopy: none   ?  Conduction: normal   ?MEDICATIONS ORDERED IN ED: ?Medications  ?ondansetron (ZOFRAN) injection 4 mg (4 mg Intravenous Given 06/13/21 2132)  ?morphine (PF) 4 MG/ML injection 4 mg (4 mg Intravenous Given 06/13/21 2133)  ?sodium chloride 0.9 % bolus 1,000 mL (1,000 mLs Intravenous New Bag/Given 06/13/21 2135)  ?alum & mag hydroxide-simeth (MAALOX/MYLANTA) 200-200-20 MG/5ML suspension 30 mL (30 mLs Oral Given 06/13/21 2209)  ?  And  ?lidocaine (XYLOCAINE) 2 % viscous mouth solution 15 mL (15 mLs Oral Given 06/13/21 2209)  ? ?IMPRESSION / MDM / ASSESSMENT AND PLAN / ED COURSE  ?I reviewed the triage vital signs and the nursing notes. ?             ?               ?The patient is on the cardiac monitor to evaluate for evidence of arrhythmia and/or significant heart rate  changes. ? ?Patient presents for acute nausea/vomiting ?The cause of the patient?s symptoms is not clear, but the patient is overall well appearing and is suspected to have a transient course of illness. ? ?Given History and Exam there does not appear to be an emergent cause of the symptoms such as small bowel obstruction, coronary syndrome, bowel ischemia, DKA, pancreatitis, appendicitis, other acute abdomen or other emergent problem. ? ?Reassessment: After treatment, the patient is feeling much better, tolerating PO fluids, and shows no signs of significant dehydration.  ? ?Disposition: Discharge home with prompt primary care physician follow up in the next 48 hours. Strict return precautions discussed. ? ?  ?FINAL CLINICAL IMPRESSION(S) / ED DIAGNOSES  ? ?Final diagnoses:  ?Epigastric pain  ?Nausea vomiting and diarrhea  ? ?Rx / DC Orders  ? ?ED Discharge Orders   ? ?      Ordered  ?  ondansetron (ZOFRAN-ODT) 8 MG disintegrating tablet  Every 8 hours PRN       ?  06/13/21 2249  ? ?  ?  ? ?  ? ?Note:  This document was prepared using Dragon voice recognition software and may include unintentional dictation errors. ?  ?Merwyn Katos, MD ?06/13/21 2254 ? ?

## 2021-06-14 NOTE — ED Notes (Signed)
This RN spoke with Toniann Fail at [facility], per Toniann Fail pt is okay to return to facility. Explained to Toniann Fail transport issues and family not returning to pick patient up. Toniann Fail states will attempt to call family and notify they are responsible for transport back to [facility] house since they checked him out of [facility], this RN explained would attempt to arrange transport from hospital end.  ?

## 2021-06-14 NOTE — ED Notes (Signed)
Waiting for transport ?  ?Georga Hacking, MD ?06/14/21 [phone] ? ?

## 2021-06-14 NOTE — ED Provider Notes (Signed)
----------------------------------------- ?  5:59 AM on 06/14/2021 ?-----------------------------------------  ? ?No events overnight.  Patient remained in the ED overnight pending transportation back to [facility]. ?  ?Irean Hong, MD ?06/14/21 0600 ? ?

## 2021-06-14 NOTE — ED Notes (Signed)
This RN left HIPPA complaint message with Patient sister Trixie Dredge [phone] who lives at [address] [place]. ?

## 2021-06-14 NOTE — ED Notes (Signed)
This RN called and spoke with Toniann Fail at [facility], [phone], per Toniann Fail, pt is a resident at [facility], pt was signed out by a family member to go home for the weekend and patient should be okay to come back to facility. Per Toniann Fail, patient taken out of system per facility protocol so at this time she is unable to review the chart and she is unable to locate the patient's chart. Toniann Fail reports she is going to contact upper management and verify who signed patient out for the weekend and if patient is okay to come back to facility and will call this RN back to verify. Pt has called multiple family members and ED staff has attempted to call multiple family members to see if patient is allowed to return to their home for the evening and unable to verify if patient is allowed to return. Currently patient is resting in bed with lights dimmed, TV on, call bell within reach, awaiting return phone call from Toniann Fail, staff member at [facility], [phone].  ?

## 2021-06-14 NOTE — ED Notes (Signed)
This RN was able to speak with Ms. Steve Alvarado (pt's sister). States they still did not have someone to pick him up at this time but they verified they were at the house and verified pt's address to get pt in a taxi. Sister verbalized understanding at this time.  ?

## 2021-06-14 NOTE — ED Notes (Signed)
Called [facility] at this time to provide transport ?

## 2021-07-01 ENCOUNTER — Encounter: Payer: Self-pay | Admitting: Emergency Medicine

## 2021-07-01 ENCOUNTER — Emergency Department: Payer: Medicare Other

## 2021-07-01 DIAGNOSIS — R072 Precordial pain: Secondary | ICD-10-CM | POA: Insufficient documentation

## 2021-07-01 DIAGNOSIS — E119 Type 2 diabetes mellitus without complications: Secondary | ICD-10-CM | POA: Diagnosis not present

## 2021-07-01 DIAGNOSIS — I10 Essential (primary) hypertension: Secondary | ICD-10-CM | POA: Insufficient documentation

## 2021-07-01 DIAGNOSIS — W133XXA Fall through floor, initial encounter: Secondary | ICD-10-CM | POA: Insufficient documentation

## 2021-07-01 DIAGNOSIS — I2510 Atherosclerotic heart disease of native coronary artery without angina pectoris: Secondary | ICD-10-CM | POA: Insufficient documentation

## 2021-07-01 LAB — BASIC METABOLIC PANEL
Anion gap: 10 (ref 5–15)
BUN: 23 mg/dL (ref 8–23)
CO2: 21 mmol/L — ABNORMAL LOW (ref 22–32)
Calcium: 9 mg/dL (ref 8.9–10.3)
Chloride: 100 mmol/L (ref 98–111)
Creatinine, Ser: 1.27 mg/dL — ABNORMAL HIGH (ref 0.61–1.24)
GFR, Estimated: >60 mL/min (ref >60)
Glucose, Bld: 481 mg/dL — ABNORMAL HIGH (ref 70–99)
Potassium: 4.3 mmol/L (ref 3.5–5.1)
Sodium: 131 mmol/L — ABNORMAL LOW (ref 135–145)

## 2021-07-01 LAB — CBC
HCT: 32.6 % — ABNORMAL LOW (ref 39.0–52.0)
Hemoglobin: 10.9 g/dL — ABNORMAL LOW (ref 13.0–17.0)
MCH: 29.6 pg (ref 26.0–34.0)
MCHC: 33.4 g/dL (ref 30.0–36.0)
MCV: 88.6 fL (ref 80.0–100.0)
Platelets: 212 10*3/uL (ref 150–400)
RBC: 3.68 MIL/uL — ABNORMAL LOW (ref 4.22–5.81)
RDW: 12.3 % (ref 11.5–15.5)
WBC: 5.9 10*3/uL (ref 4.0–10.5)
nRBC: 0 % (ref 0.0–0.2)

## 2021-07-01 LAB — TROPONIN I (HIGH SENSITIVITY): Troponin I (High Sensitivity): 19 ng/L — ABNORMAL HIGH (ref <18)

## 2021-07-01 IMAGING — CR DG CHEST 2V
2 series · 2 of 2 positions shown · non-contrast
Comparison: [DATE]

CLINICAL DATA: Chest pain

EXAM:
CHEST - 2 VIEW

[chest lat]
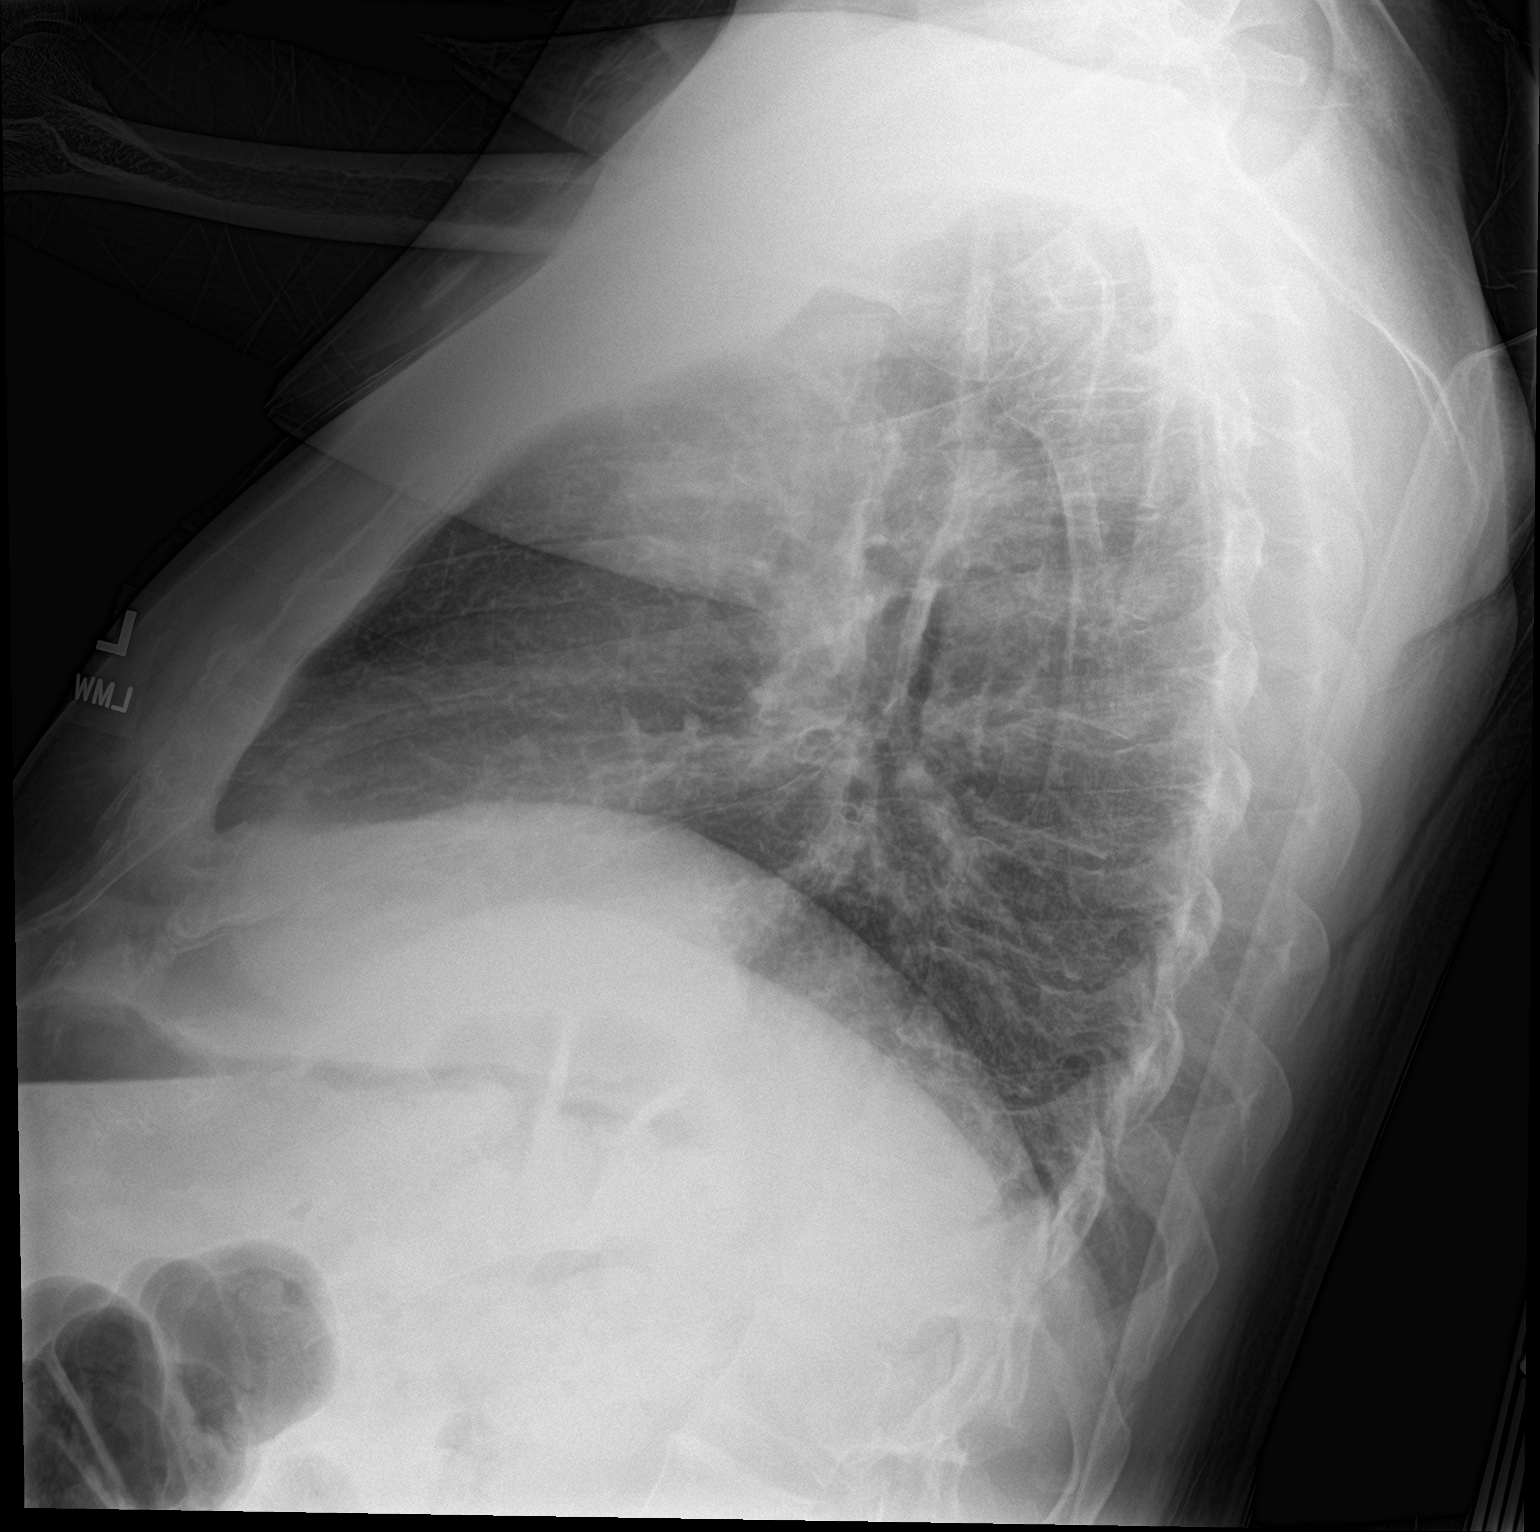

[chest ap]
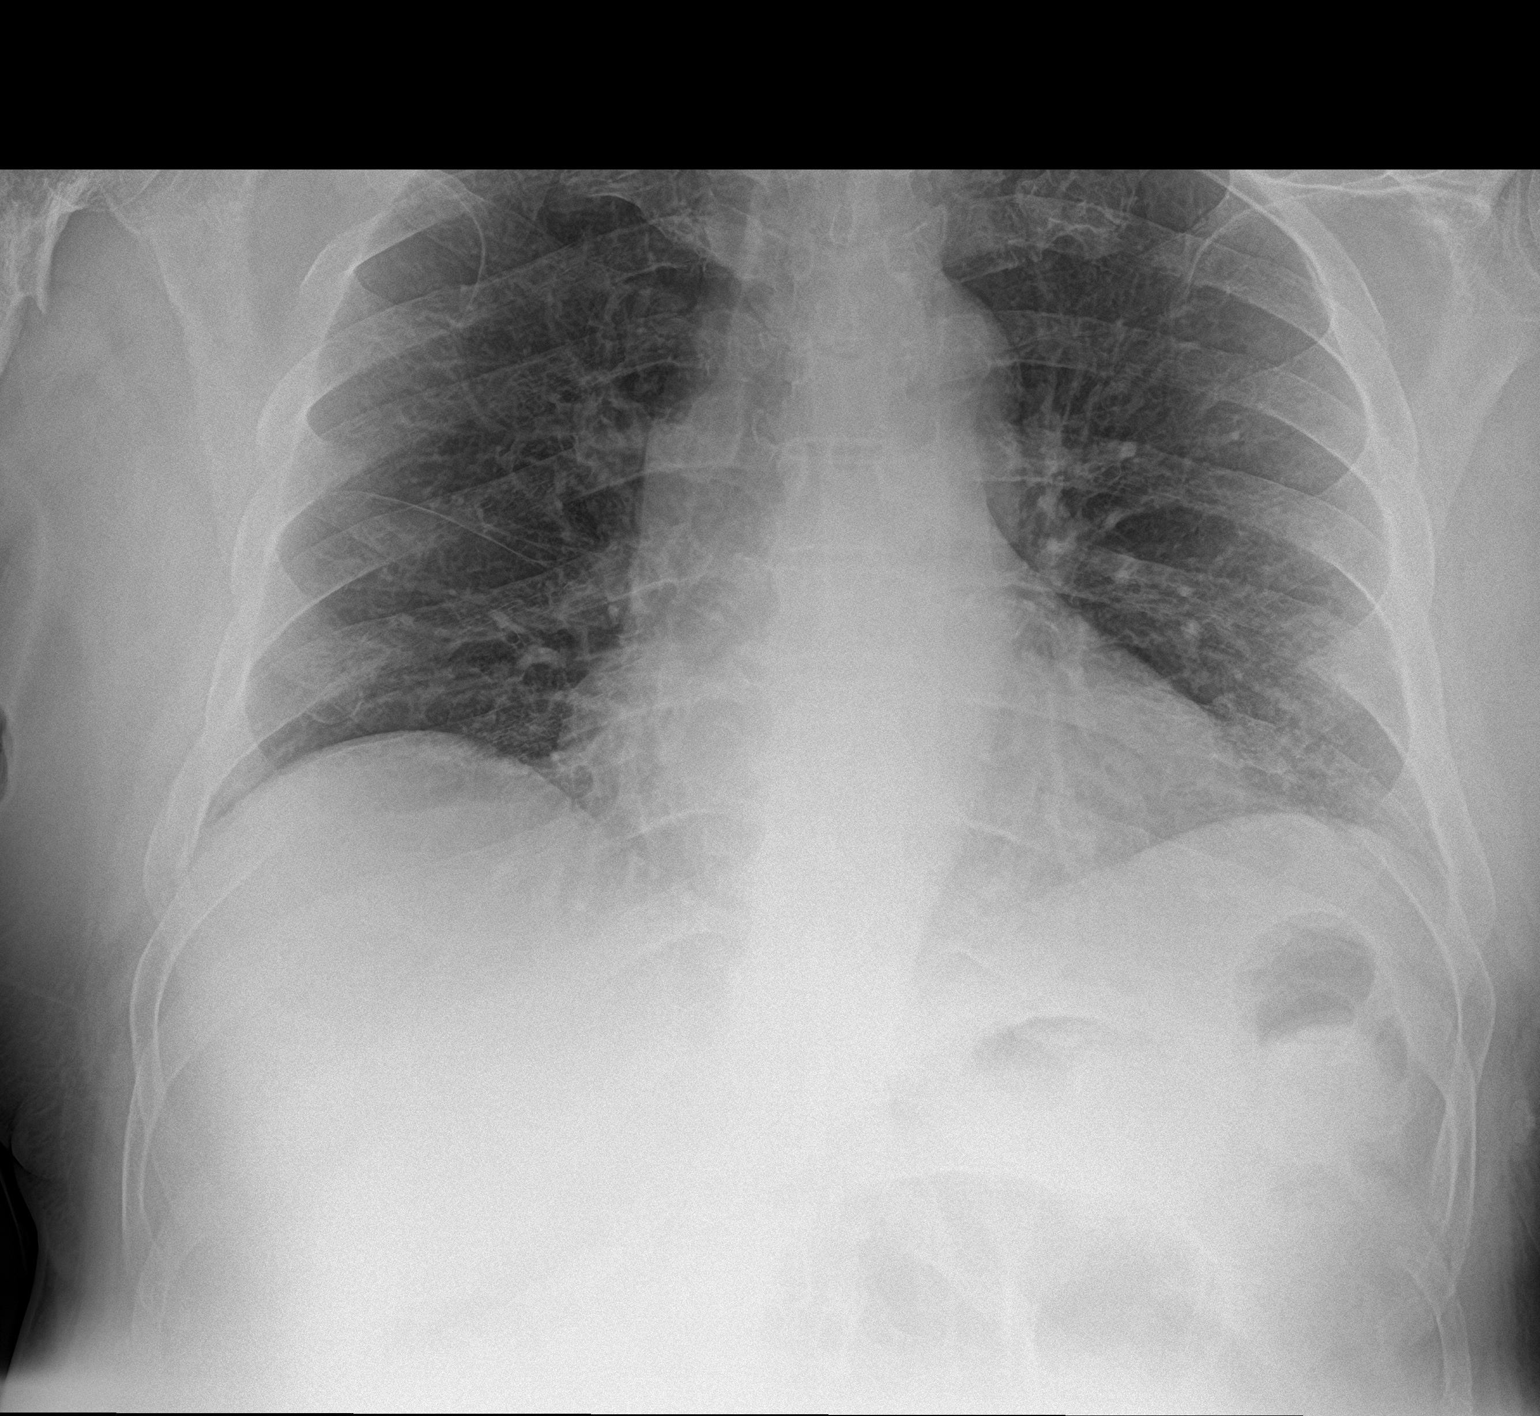

[2 of 2 positions shown; findings below may reference images not displayed]

FINDINGS: The heart size and mediastinal contours are within normal limits.
Mild bronchitic changes. Both lungs are clear. Skeletal structures
are unremarkable.
IMPRESSION: No active cardiopulmonary disease.

## 2021-07-01 NOTE — ED Triage Notes (Signed)
Pt presents via POV with complaints of left sided chest/rib pain. He notes falling a few months ago but the pain has gotten significantly worse over the last 2-3 days. Denies Sob.  ?

## 2021-07-02 ENCOUNTER — Emergency Department
Admission: EM | Admit: 2021-07-02 | Discharge: 2021-07-02 | Disposition: A | Discharge status: Home / Self Care | Payer: Medicare Other | Attending: Emergency Medicine | Admitting: Emergency Medicine

## 2021-07-02 ENCOUNTER — Emergency Department: Payer: Medicare Other

## 2021-07-02 DIAGNOSIS — R072 Precordial pain: Secondary | ICD-10-CM | POA: Diagnosis not present

## 2021-07-02 DIAGNOSIS — R079 Chest pain, unspecified: Secondary | ICD-10-CM

## 2021-07-02 LAB — TROPONIN I (HIGH SENSITIVITY): Troponin I (High Sensitivity): 17 ng/L (ref <18)

## 2021-07-02 MED ORDER — OXYCODONE-ACETAMINOPHEN 5-325 MG PO TABS
1.0000 | ORAL_TABLET | Freq: Once | ORAL | Status: AC
  Administered 2021-07-02: 1 via ORAL
  Filled 2021-07-02: qty 1

## 2021-07-02 MED ORDER — IOHEXOL 350 MG/ML SOLN
100.0000 mL | Freq: Once | INTRAVENOUS | Status: AC | PRN
  Administered 2021-07-02: 100 mL via INTRAVENOUS

## 2021-07-02 MED ORDER — LIDOCAINE 5 % EX PTCH: 1.0000 | MEDICATED_PATCH | CUTANEOUS | 0 refills | Status: AC

## 2021-07-02 MED ORDER — ISOSORBIDE MONONITRATE ER 60 MG PO TB24: 30.0000 mg | ORAL_TABLET | Freq: Every day | ORAL | Status: DC

## 2021-07-02 MED ORDER — SODIUM CHLORIDE 0.9 % IV BOLUS
1000.0000 mL | Freq: Once | INTRAVENOUS | Status: AC
  Administered 2021-07-02: 1000 mL via INTRAVENOUS

## 2021-07-02 MED ORDER — CARVEDILOL 6.25 MG PO TABS
3.1250 mg | ORAL_TABLET | Freq: Once | ORAL | Status: AC
  Administered 2021-07-02: 3.125 mg via ORAL
  Filled 2021-07-02: qty 1

## 2021-07-02 MED ORDER — ISOSORBIDE MONONITRATE ER 60 MG PO TB24
30.0000 mg | ORAL_TABLET | Freq: Once | ORAL | Status: AC
  Administered 2021-07-02: 30 mg via ORAL
  Filled 2021-07-02: qty 1

## 2021-07-02 NOTE — Discharge Instructions (Addendum)
Your CAT scan was overall reassuring as was your cardiac enzymes and EKG.  You likely have some nerve damage to the area where you hit your chest which is the cause of your pain.  You can take 1 I will and try the Lidoderm patch to see if this helps with your pain.  Please follow-up with your primary care provider. ?

## 2021-07-02 NOTE — ED Provider Notes (Signed)
? ?[facility] ?Provider Note ? ? ? Event Date/Time  ? First MD Initiated Contact with Patient 07/02/21 0046   ?  (approximate) ? ? ?History  ? ?Chest Pain ? ? ?HPI ? ?Steve Alvarado is a 66 y.o. male with past medical history of hyperlipidemia, hypertension, coronary disease, diabetes who presents with chest pain.  Patient notes that several weeks ago he had a fall hitting his head shoulder and chest.  Was admitted to [facility] with a fracture of his right shoulder required rehab stay.  He has had pain in the sternal area since that time.  Also feels numbness in that area.  It is rather constant nonexertional nonpleuritic.  Denies shortness of breath.  There was no significant change tonight, simply comes to the ED because it is not improving.  Denies associated nausea vomiting or abdominal pain.  Not having fevers.  No increased lower extremity edema.  Has not had his blood pressure medicine in several days because he ran out.  Denies headache, visual change numbness tingling or weakness. ?  ? ?Past Medical History:  ?Diagnosis Date  ? Coronary artery disease   ? Diabetes mellitus   ? Type II  ? Hyperlipidemia   ? Hypertension   ? Thyroid disease   ? ? ?Patient Active Problem List  ? Diagnosis Date Noted  ? Chest pain 10/21/2020  ? Depression 10/21/2020  ? Non compliance w medication regimen 10/21/2020  ? Cocaine abuse (HCC) 09/05/2020  ? Adjustment disorder with emotional disturbance 09/05/2020  ? Hypertensive urgency 03/08/2015  ? Essential hypertension 02/14/2011  ? CAD (coronary artery disease) 08/13/2010  ? Stented coronary artery 08/13/2010  ? Diabetes mellitus (HCC) 08/13/2010  ? ? ? ?Physical Exam  ?Triage Vital Signs: ?ED Triage Vitals  ?Enc Vitals Group  ?   BP 07/01/21 2313 (!) 179/99  ?   Pulse Rate 07/01/21 2313 73  ?   Resp 07/01/21 2313 20  ?   Temp 07/01/21 2313 98.6 ?F (37 ?C)  ?   Temp Source 07/01/21 2313 Oral  ?   SpO2 07/01/21 2313 99 %  ?   Weight 07/01/21 2311 220 lb (99.8  kg)  ?   Height 07/01/21 2311 6\' 2"  (1.88 m)  ?   Head Circumference --   ?   Peak Flow --   ?   Pain Score 07/01/21 2311 9  ?   Pain Loc --   ?   Pain Edu? --   ?   Excl. in GC? --   ? ? ?Most recent vital signs: ?Vitals:  ? 07/02/21 0300 07/02/21 0330  ?BP: (!) 213/105 (!) 197/107  ?Pulse: 75 77  ?Resp: 14 14  ?Temp:    ?SpO2: 99% 99%  ? ? ? ?General: Awake, no distress.  ?CV:  Good peripheral perfusion. No edema ?Resp:  Normal effort.  ?Abd:  No distention. Soft, no tenderness ?Neuro:             Awake, Alert, Oriented x 3  ?Other:  Tenderness to palpation over the right lower rib cage and sternum, no crepitus ? ? ?ED Results / Procedures / Treatments  ?Labs ?(all labs ordered are listed, but only abnormal results are displayed) ?Labs Reviewed  ?BASIC METABOLIC PANEL - Abnormal; Notable for the following components:  ?    Result Value  ? Sodium 131 (*)   ? CO2 21 (*)   ? Glucose, Bld 481 (*)   ? Creatinine, Ser 1.27 (*)   ?  All other components within normal limits  ?CBC - Abnormal; Notable for the following components:  ? RBC 3.68 (*)   ? Hemoglobin 10.9 (*)   ? HCT 32.6 (*)   ? All other components within normal limits  ?TROPONIN I (HIGH SENSITIVITY) - Abnormal; Notable for the following components:  ? Troponin I (High Sensitivity) 19 (*)   ? All other components within normal limits  ?TROPONIN I (HIGH SENSITIVITY)  ? ? ? ?EKG ? ?EKG interpreted myself, normal sinus rhythm with first-degree AV block, T wave flattening in the inferior and lateral leads ? ? ?RADIOLOGY ?I reviewed the CXR which does not show any acute cardiopulmonary process; agree with radiology report  ? ? ? ?PROCEDURES: ? ?Critical Care performed: No ? ?.1-3 Lead EKG Interpretation ?Performed by: Georga HackingMcHugh, Staci Carver Rose, MD ?Authorized by: Georga HackingMcHugh, Tison Leibold Rose, MD  ? ?  Interpretation: normal   ?  ECG rate assessment: normal   ?  Ectopy: none   ?  Conduction: normal   ? ?The patient is on the cardiac monitor to evaluate for evidence of arrhythmia  and/or significant heart rate changes. ? ? ?MEDICATIONS ORDERED IN ED: ?Medications  ?sodium chloride 0.9 % bolus 1,000 mL (0 mLs Intravenous Stopped 07/02/21 0323)  ?oxyCODONE-acetaminophen (PERCOCET/ROXICET) 5-325 MG per tablet 1 tablet (1 tablet Oral Given 07/02/21 0259)  ?carvedilol (COREG) tablet 3.125 mg (3.125 mg Oral Given 07/02/21 0259)  ?iohexol (OMNIPAQUE) 350 MG/ML injection 100 mL (100 mLs Intravenous Contrast Given 07/02/21 0239)  ?isosorbide mononitrate (IMDUR) 24 hr tablet 30 mg (30 mg Oral Given 07/02/21 0342)  ? ? ? ?IMPRESSION / MDM / ASSESSMENT AND PLAN / ED COURSE  ?I reviewed the triage vital signs and the nursing notes. ?             ?               ? ?Differential diagnosis includes, but is not limited to, rib fracture with delayed healing, muscle contusion, neuropathic pain, pulmonary embolism, aortic dissection, ACS ? ?Patient is a 66 year old male with multiple comorbidities presenting with chest pain.  Has been going on since he had a fall in which she hit the chest on the floor.  It is located over the lower sternum and left lower rib cage.  Describes a numb feeling.  Pain is constant nonexertional nonpleuritic.  It is reproducible on exam.  Abdomen is soft and nontender.  Chest x-ray without obvious abnormality.  Overall I suspect this is musculoskeletal given it occurred in the setting of trauma.  We will obtain a CT of the chest to further evaluate for any other underlying abnormality or delayed healing of rib fracture.  EKG has some nonspecific T wave flattening no overt signs of ischemia, looks similar to prior. ? ?CT angio shows some atelectasis but no pulmonary embolism or other abnormality. ? ? ?Patient was quite hypertensive in the ED but has not had his antihypertensives in the last several days because he ran out.  He is given his Imdur and carvedilol blood pressure did downtrend.  Is not having symptoms of hypertensive emergency no indication for admission or IV medication.  Will  discharge with Lidoderm patch and Tylenol. ?  ? ? ?FINAL CLINICAL IMPRESSION(S) / ED DIAGNOSES  ? ?Final diagnoses:  ?Nonspecific chest pain  ? ? ? ?Rx / DC Orders  ? ?ED Discharge Orders   ? ?      Ordered  ?  lidocaine (LIDODERM) 5 %  Every  24 hours       ? 07/02/21 0323  ? ?  ?  ? ?  ? ? ? ?Note:  This document was prepared using Dragon voice recognition software and may include unintentional dictation errors. ?  ?Georga Hacking, MD ?07/02/21 0345 ? ?

## 2021-07-08 ENCOUNTER — Emergency Department: Payer: Medicare Other

## 2021-07-08 ENCOUNTER — Encounter: Payer: Self-pay | Admitting: *Deleted

## 2021-07-08 ENCOUNTER — Inpatient Hospital Stay
Admission: EM | Admit: 2021-07-08 | Discharge: 2021-07-12 | DRG: 684 | Disposition: A | Discharge status: Skilled Nursing Facility | Payer: Medicare Other | Attending: Internal Medicine | Admitting: Internal Medicine

## 2021-07-08 DIAGNOSIS — E860 Dehydration: Secondary | ICD-10-CM | POA: Diagnosis present

## 2021-07-08 DIAGNOSIS — E1142 Type 2 diabetes mellitus with diabetic polyneuropathy: Secondary | ICD-10-CM

## 2021-07-08 DIAGNOSIS — I10 Essential (primary) hypertension: Secondary | ICD-10-CM | POA: Diagnosis present

## 2021-07-08 DIAGNOSIS — Z955 Presence of coronary angioplasty implant and graft: Secondary | ICD-10-CM

## 2021-07-08 DIAGNOSIS — E039 Hypothyroidism, unspecified: Secondary | ICD-10-CM | POA: Diagnosis present

## 2021-07-08 DIAGNOSIS — E119 Type 2 diabetes mellitus without complications: Secondary | ICD-10-CM

## 2021-07-08 DIAGNOSIS — Z79899 Other long term (current) drug therapy: Secondary | ICD-10-CM

## 2021-07-08 DIAGNOSIS — Z20822 Contact with and (suspected) exposure to covid-19: Secondary | ICD-10-CM | POA: Diagnosis present

## 2021-07-08 DIAGNOSIS — N179 Acute kidney failure, unspecified: Principal | ICD-10-CM | POA: Diagnosis present

## 2021-07-08 DIAGNOSIS — R4182 Altered mental status, unspecified: Secondary | ICD-10-CM | POA: Diagnosis present

## 2021-07-08 DIAGNOSIS — E1165 Type 2 diabetes mellitus with hyperglycemia: Secondary | ICD-10-CM | POA: Diagnosis not present

## 2021-07-08 DIAGNOSIS — Z794 Long term (current) use of insulin: Secondary | ICD-10-CM

## 2021-07-08 DIAGNOSIS — I2510 Atherosclerotic heart disease of native coronary artery without angina pectoris: Secondary | ICD-10-CM | POA: Diagnosis present

## 2021-07-08 DIAGNOSIS — E785 Hyperlipidemia, unspecified: Secondary | ICD-10-CM | POA: Diagnosis present

## 2021-07-08 DIAGNOSIS — Z7984 Long term (current) use of oral hypoglycemic drugs: Secondary | ICD-10-CM

## 2021-07-08 DIAGNOSIS — Z79890 Hormone replacement therapy (postmenopausal): Secondary | ICD-10-CM

## 2021-07-08 DIAGNOSIS — Z8249 Family history of ischemic heart disease and other diseases of the circulatory system: Secondary | ICD-10-CM

## 2021-07-08 DIAGNOSIS — Z5989 Other problems related to housing and economic circumstances: Secondary | ICD-10-CM

## 2021-07-08 LAB — HEPATIC FUNCTION PANEL
ALT: 20 U/L (ref 0–44)
AST: 23 U/L (ref 15–41)
Albumin: 4.1 g/dL (ref 3.5–5.0)
Alkaline Phosphatase: 77 U/L (ref 38–126)
Bilirubin, Direct: 0.3 mg/dL — ABNORMAL HIGH (ref 0.0–0.2)
Indirect Bilirubin: 1.1 mg/dL — ABNORMAL HIGH (ref 0.3–0.9)
Total Bilirubin: 1.4 mg/dL — ABNORMAL HIGH (ref 0.3–1.2)
Total Protein: 7.8 g/dL (ref 6.5–8.1)

## 2021-07-08 LAB — CBC
HCT: 32.9 % — ABNORMAL LOW (ref 39.0–52.0)
Hemoglobin: 10.1 g/dL — ABNORMAL LOW (ref 13.0–17.0)
MCH: 26.5 pg (ref 26.0–34.0)
MCHC: 30.7 g/dL (ref 30.0–36.0)
MCV: 86.4 fL (ref 80.0–100.0)
Platelets: 343 10*3/uL (ref 150–400)
RBC: 3.81 MIL/uL — ABNORMAL LOW (ref 4.22–5.81)
RDW: 15.2 % (ref 11.5–15.5)
WBC: 5.7 10*3/uL (ref 4.0–10.5)
nRBC: 0 % (ref 0.0–0.2)

## 2021-07-08 LAB — BASIC METABOLIC PANEL
Anion gap: 12 (ref 5–15)
BUN: 28 mg/dL — ABNORMAL HIGH (ref 8–23)
CO2: 24 mmol/L (ref 22–32)
Calcium: 9.6 mg/dL (ref 8.9–10.3)
Chloride: 95 mmol/L — ABNORMAL LOW (ref 98–111)
Creatinine, Ser: 2.03 mg/dL — ABNORMAL HIGH (ref 0.61–1.24)
GFR, Estimated: 35 mL/min — ABNORMAL LOW (ref >60)
Glucose, Bld: 405 mg/dL — ABNORMAL HIGH (ref 70–99)
Potassium: 4 mmol/L (ref 3.5–5.1)
Sodium: 131 mmol/L — ABNORMAL LOW (ref 135–145)

## 2021-07-08 LAB — LIPASE, BLOOD: Lipase: 31 U/L (ref 11–51)

## 2021-07-08 LAB — TROPONIN I (HIGH SENSITIVITY)
Troponin I (High Sensitivity): 33 ng/L — ABNORMAL HIGH (ref <18)
Troponin I (High Sensitivity): 33 ng/L — ABNORMAL HIGH (ref <18)

## 2021-07-08 LAB — RESP PANEL BY RT-PCR (FLU A&B, COVID) ARPGX2
Influenza A by PCR: NEGATIVE
Influenza B by PCR: NEGATIVE
SARS Coronavirus 2 by RT PCR: NEGATIVE

## 2021-07-08 LAB — CBG MONITORING, ED: Glucose-Capillary: 357 mg/dL — ABNORMAL HIGH (ref 70–99)

## 2021-07-08 LAB — CK: Total CK: 497 U/L — ABNORMAL HIGH (ref 49–397)

## 2021-07-08 IMAGING — CR DG CHEST 2V
1 series · 2 of 2 positions shown · non-contrast
Comparison: [DATE]

CLINICAL DATA: Weakness, dizziness

EXAM:
CHEST - 2 VIEW

[Series 1: dg chest 2 view · 0.14mm/px · 2 of 2 slices shown]
[im 1/2]
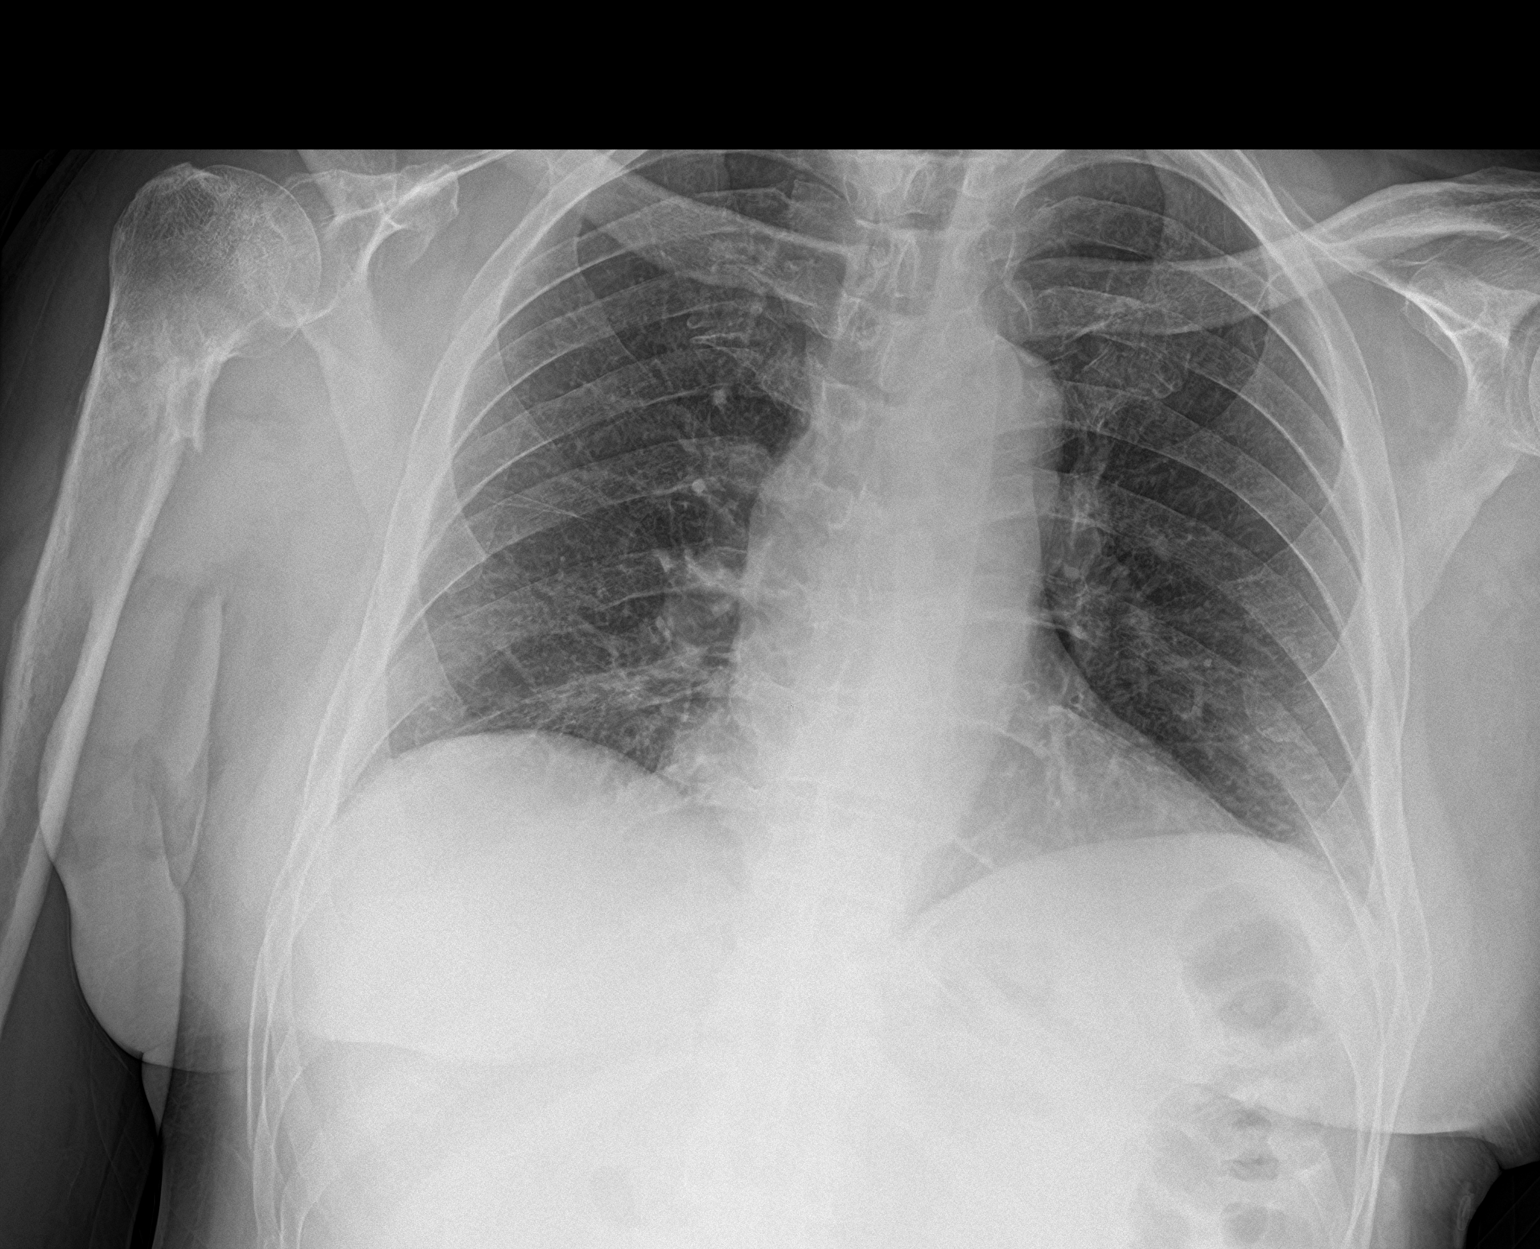
[im 2/2]
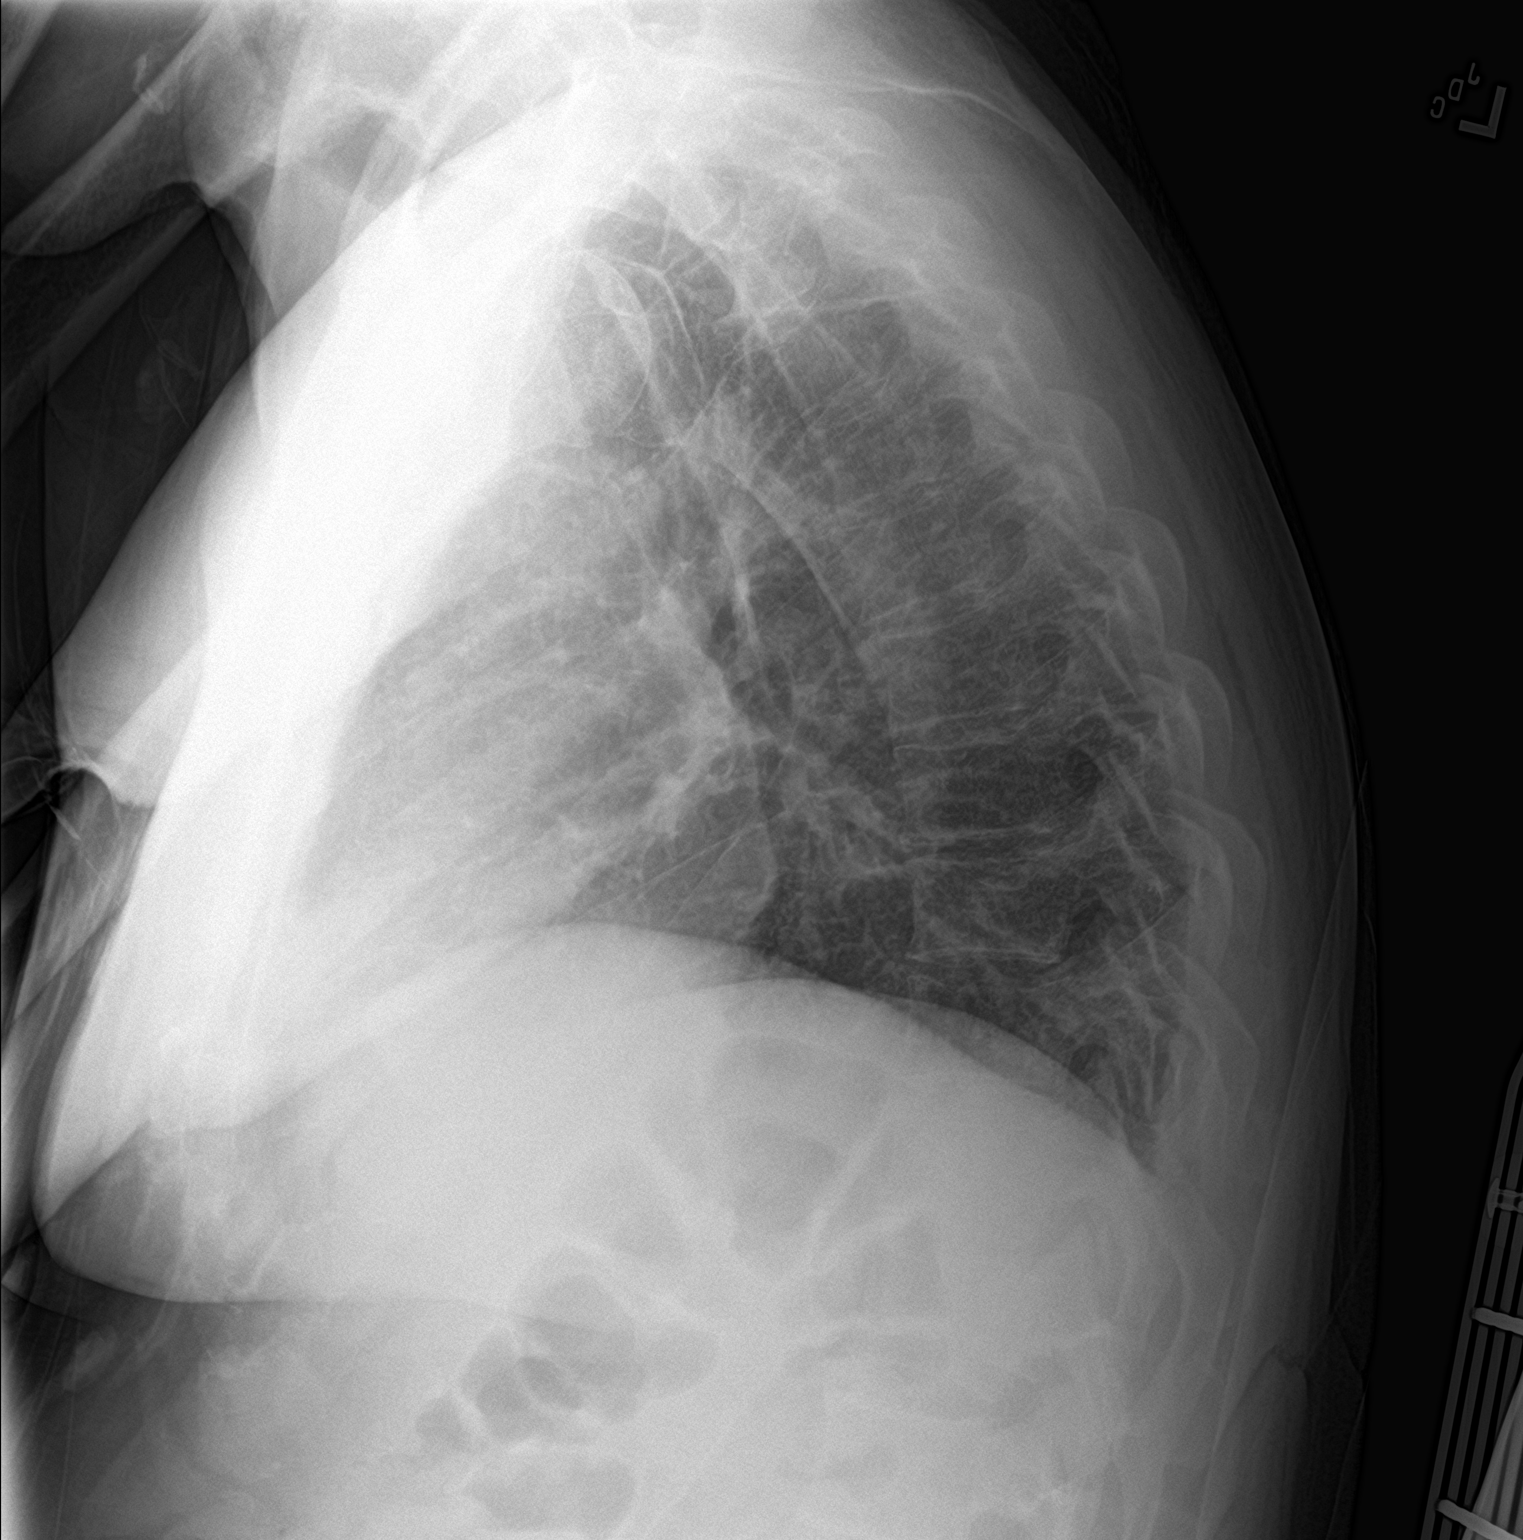

[2 of 2 positions shown; findings below may reference images not displayed]

FINDINGS: Frontal and lateral views of the chest demonstrate an unremarkable
cardiac silhouette. No airspace disease, effusion, or pneumothorax.
No acute bony abnormalities. Prior healed proximal right humeral
fracture.
IMPRESSION: 1. No acute intrathoracic process.

## 2021-07-08 IMAGING — CT CT HEAD W/O CM
4 series · 17 of 47 positions shown, 19 images · non-contrast
Comparison: [DATE]

CLINICAL DATA: Dizziness, weakness



[Series 2: head bone · axial · 0.46mm/px · z∈[-122,-64]mm · 4 of 85 slices shown]
[im 9/85  bone]
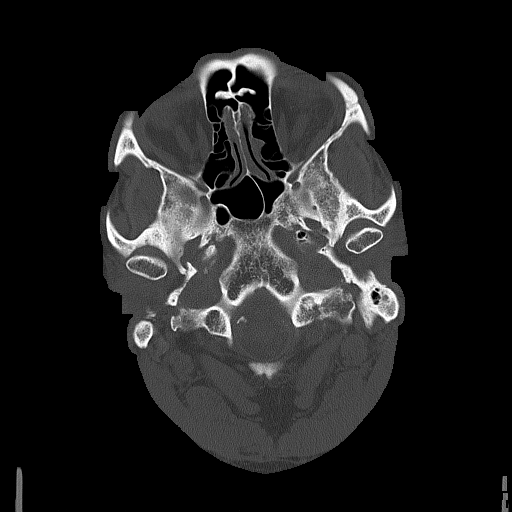
[im 17/85  bone]
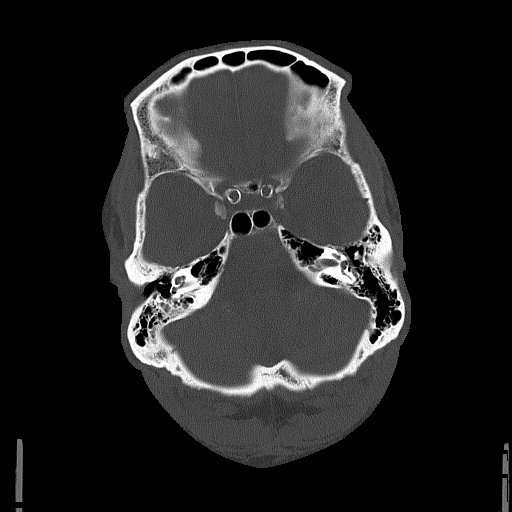
[im 26/85  bone]
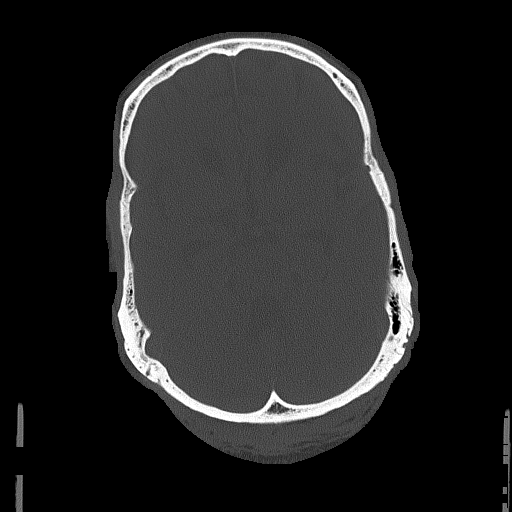
[im 38/85  bone]
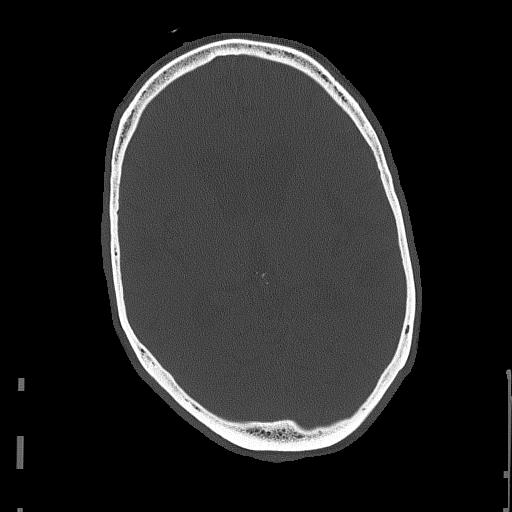

[Series 3: head wo · axial · 0.46mm/px · z∈[-118,+2]mm · 7 of 34 slices shown, 9 images]
[im 5/34  brain]
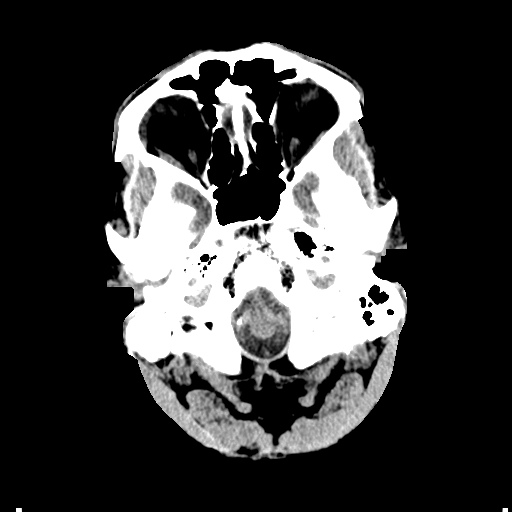
[im 5/34  bone]
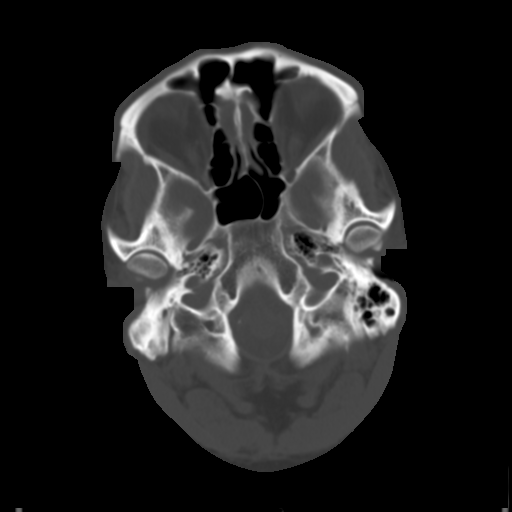
[im 9/34  brain]
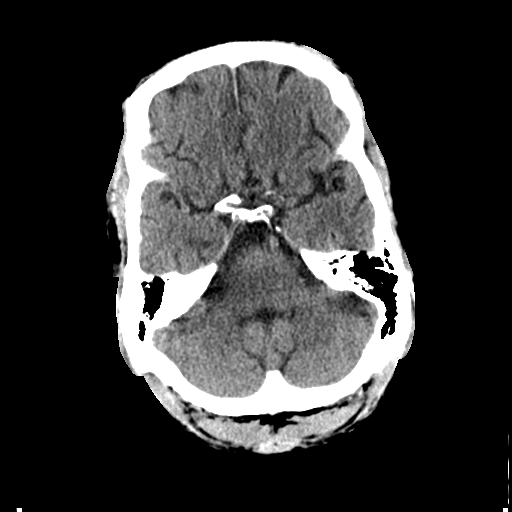
[im 13/34  brain]
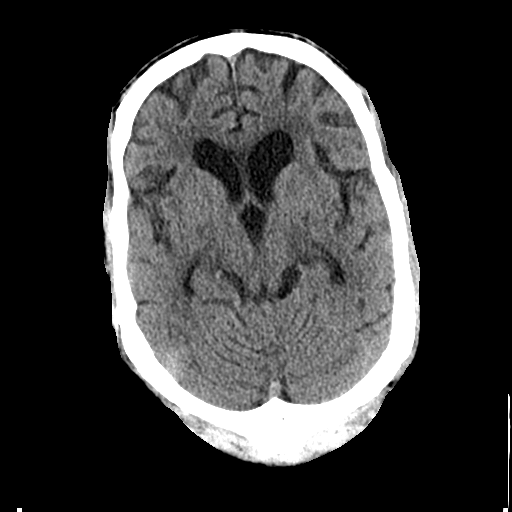
[im 17/34  brain]
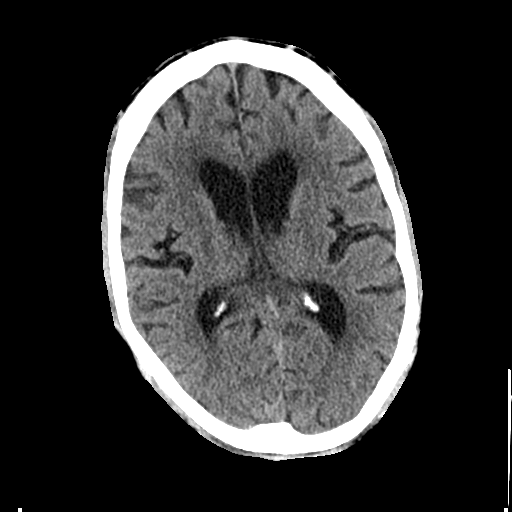
[im 21/34  brain]
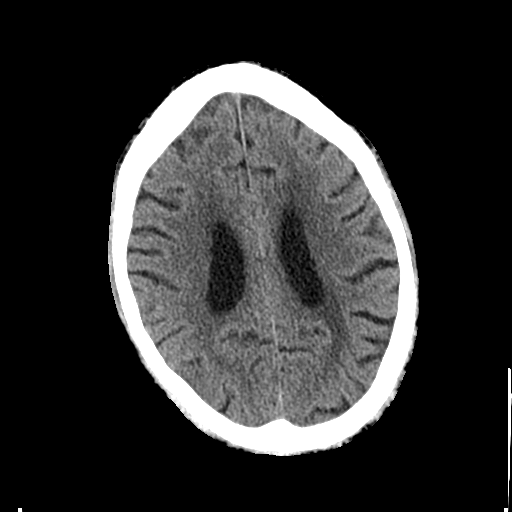
[im 21/34  bone]
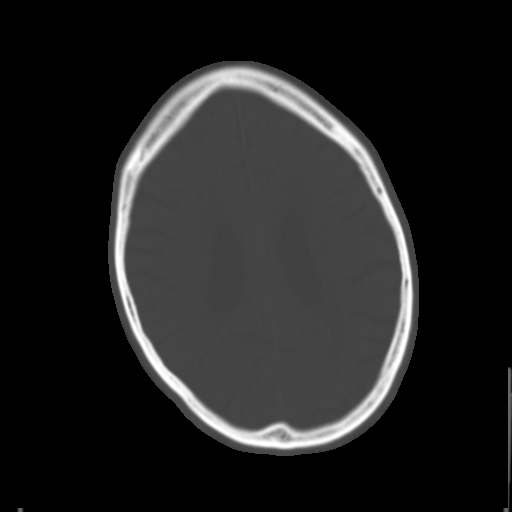
[im 25/34  brain]
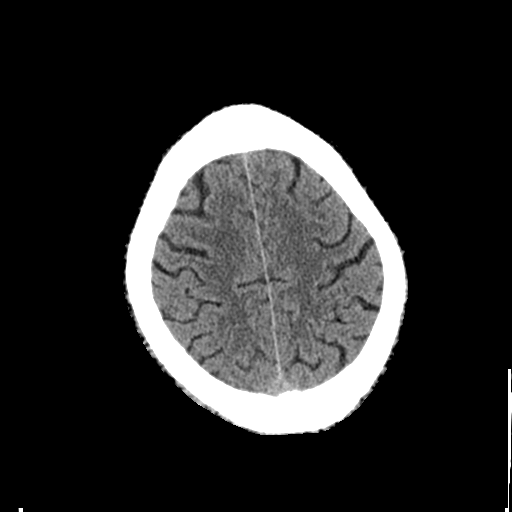
[im 29/34  brain]
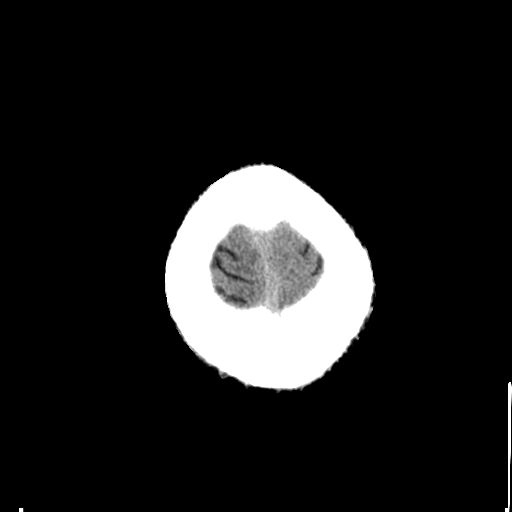

[Series 4: coronal soft tissue · coronal · 0.36mm/px · 3 of 74 slices shown]
[im 25/74  brain]
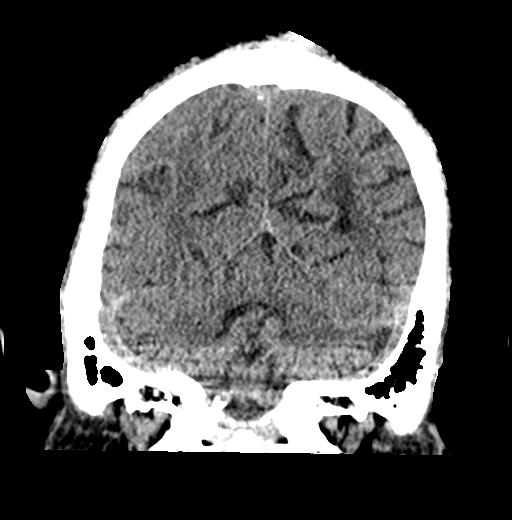
[im 33/74  brain]
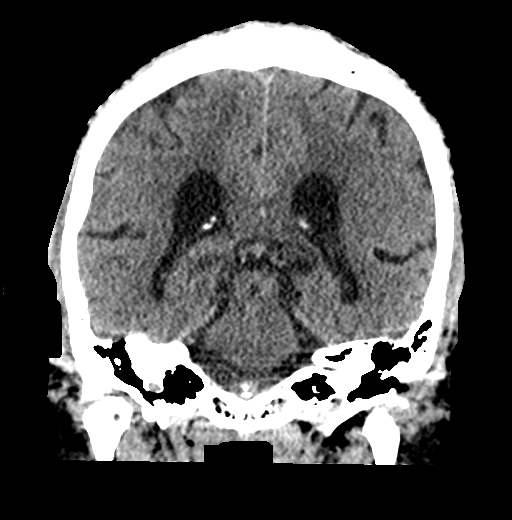
[im 41/74  brain]
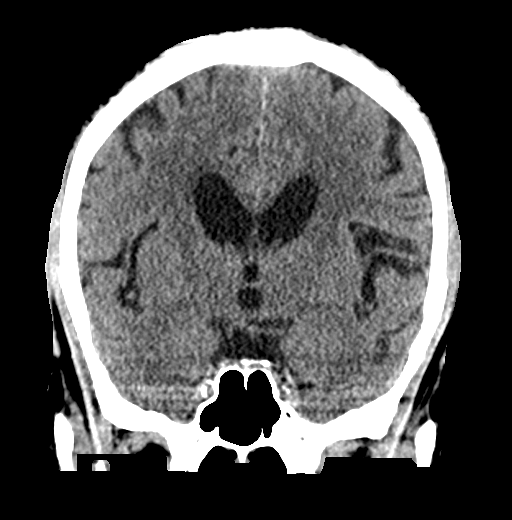

[Series 5: sagittal soft tissue · sagittal · 0.37mm/px · 3 of 59 slices shown]
[im 20/59  brain]
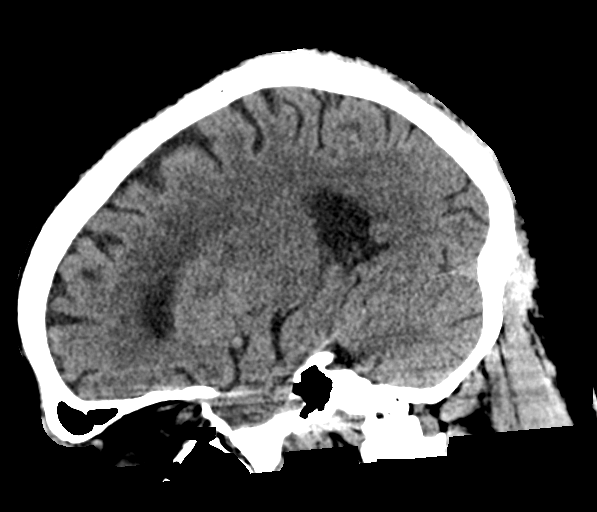
[im 30/59  brain]
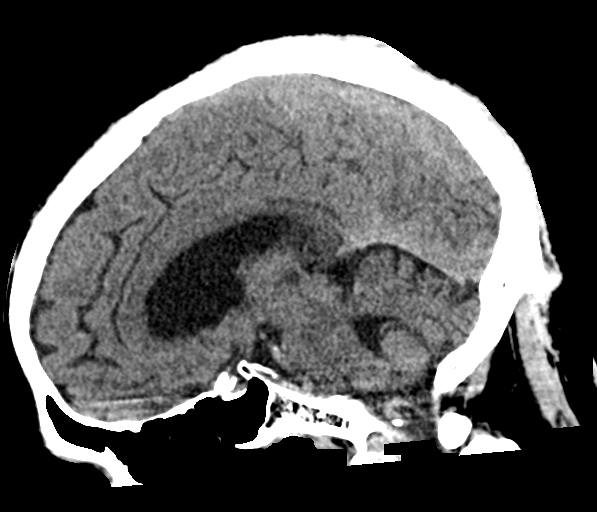
[im 39/59  brain]
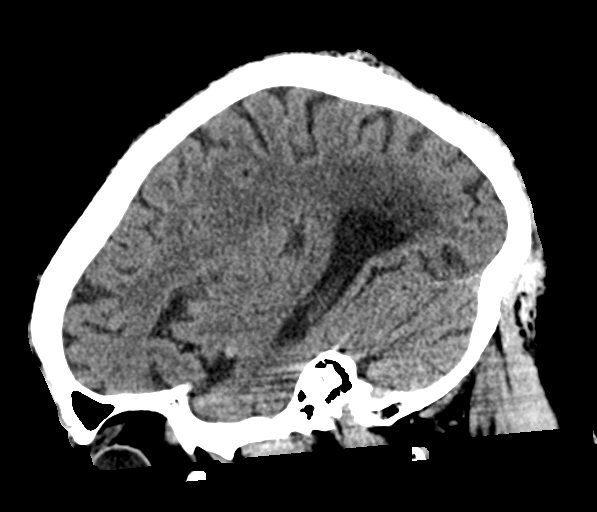

[17 of 47 positions shown; findings below may reference images not displayed]

FINDINGS: Brain: No acute infarct or hemorrhage. Lateral ventricles and
midline structures are stable. No acute extra-axial fluid
collections. No mass effect.

Vascular: Stable atherosclerosis of internal carotid arteries. No
hyperdense vessel.

Skull: Normal. Negative for fracture or focal lesion.

Sinuses/Orbits: No acute finding.

Other: None.
IMPRESSION: 1. Stable head CT, no acute intracranial process.

## 2021-07-08 MED ORDER — SODIUM CHLORIDE 0.9 % IV BOLUS
1000.0000 mL | Freq: Once | INTRAVENOUS | Status: AC
  Administered 2021-07-08: 1000 mL via INTRAVENOUS

## 2021-07-08 NOTE — ED Provider Notes (Signed)
? ?[facility] ?Provider Note ? ? ? Event Date/Time  ? First MD Initiated Contact with Patient 07/08/21 2153   ?  (approximate) ? ? ?History  ? ?Altered Mental Status ? ? ?HPI ? ?Steve Alvarado is a 66 y.o. male with a history of diabetes, hypertension, cocaine abuse who is brought to the ED due to confusion, generalized weakness, dizziness with standing.  She reports that he has had poor oral intake of food and fluids over the last few days.  Patient reports generalized headache, body aches.  No vomiting or diarrhea. ?  ? ? ?Physical Exam  ? ?Triage Vital Signs: ?ED Triage Vitals  ?Enc Vitals Group  ?   BP 07/08/21 2059 (!) 148/97  ?   Pulse Rate 07/08/21 2059 (!) 2  ?   Resp 07/08/21 2059 18  ?   Temp 07/08/21 2059 98 ?F (36.7 ?C)  ?   Temp Source 07/08/21 2059 Oral  ?   SpO2 07/08/21 2059 97 %  ?   Weight 07/08/21 2058 220 lb (99.8 kg)  ?   Height 07/08/21 2058 6\' 2"  (1.88 m)  ?   Head Circumference --   ?   Peak Flow --   ?   Pain Score 07/08/21 2058 0  ?   Pain Loc --   ?   Pain Edu? --   ?   Excl. in GC? --   ? ? ?Most recent vital signs: ?Vitals:  ? 07/08/21 2200 07/08/21 2310  ?BP: (!) 179/89 (!) 161/85  ?Pulse: 78 80  ?Resp: 20 16  ?Temp: 98.1 ?F (36.7 ?C)   ?SpO2: 100% 99%  ? ? ? ?General: Awake, no distress.  ?CV:  Good peripheral perfusion.  Regular rate rhythm ?Resp:  Normal effort.  Clear to auscultation bilaterally ?Abd:  No distention.  Abdomen is soft with bilateral lower abdominal tenderness. ?Other:  No lower extremity swelling, no rash.  Dry mucous membranes. ? ? ?ED Results / Procedures / Treatments  ? ?Labs ?(all labs ordered are listed, but only abnormal results are displayed) ?Labs Reviewed  ?BASIC METABOLIC PANEL - Abnormal; Notable for the following components:  ?    Result Value  ? Sodium 131 (*)   ? Chloride 95 (*)   ? Glucose, Bld 405 (*)   ? BUN 28 (*)   ? Creatinine, Ser 2.03 (*)   ? GFR, Estimated 35 (*)   ? All other components within normal limits  ?CBC -  Abnormal; Notable for the following components:  ? RBC 3.81 (*)   ? Hemoglobin 10.1 (*)   ? HCT 32.9 (*)   ? All other components within normal limits  ?HEPATIC FUNCTION PANEL - Abnormal; Notable for the following components:  ? Total Bilirubin 1.4 (*)   ? Bilirubin, Direct 0.3 (*)   ? Indirect Bilirubin 1.1 (*)   ? All other components within normal limits  ?CK - Abnormal; Notable for the following components:  ? Total CK 497 (*)   ? All other components within normal limits  ?CBG MONITORING, ED - Abnormal; Notable for the following components:  ? Glucose-Capillary 357 (*)   ? All other components within normal limits  ?TROPONIN I (HIGH SENSITIVITY) - Abnormal; Notable for the following components:  ? Troponin I (High Sensitivity) 33 (*)   ? All other components within normal limits  ?TROPONIN I (HIGH SENSITIVITY) - Abnormal; Notable for the following components:  ? Troponin I (High Sensitivity) 33 (*)   ?  All other components within normal limits  ?RESP PANEL BY RT-PCR (FLU A&B, COVID) ARPGX2  ?LIPASE, BLOOD  ?URINALYSIS, ROUTINE W REFLEX MICROSCOPIC  ? ? ? ?EKG ? ?Interpreted by me ?Sinus rhythm, rate of 86.  Normal axis, normal intervals.  Normal QRS ST segments and T waves. ? ? ?RADIOLOGY ?Chest x-ray viewed interpreted by me, appears unremarkable.  Radiology report reviewed. ? ?CT head unremarkable ? ?CT abdomen pelvis unremarkable ? ? ? ?PROCEDURES: ? ?Critical Care performed: No ? ?Procedures ? ? ?MEDICATIONS ORDERED IN ED: ?Medications  ?sodium chloride 0.9 % bolus 1,000 mL (1,000 mLs Intravenous New Bag/Given 07/08/21 2308)  ? ? ? ?IMPRESSION / MDM / ASSESSMENT AND PLAN / ED COURSE  ?I reviewed the triage vital signs and the nursing notes. ?             ?               ? ?Differential diagnosis includes, but is not limited to, intracranial hemorrhage, diverticulitis, appendicitis, UTI, pneumonia, COVID, dehydration, electrolyte abnormality ? ? ?Patient presents with confusion, generalized weakness.  Vital  signs unremarkable, no signs of sepsis or shock.  He does have hyperglycemia along with AKI with creatinine increased from a baseline of 1.2-2.0 today.  We will give IV fluids for hydration.  Imaging is reassuring, nonsurgical, but with mental status changes and severity of symptoms, will request hospitalist evaluation to continue supportive care and ensure clinical improvement. ? ?  ? ? ?FINAL CLINICAL IMPRESSION(S) / ED DIAGNOSES  ? ?Final diagnoses:  ?Altered mental status, unspecified altered mental status type  ?AKI (acute kidney injury) (HCC)  ?Type 2 diabetes mellitus without complication, with long-term current use of insulin (HCC)  ? ? ? ?Rx / DC Orders  ? ?ED Discharge Orders   ? ? None  ? ?  ? ? ? ?Note:  This document was prepared using Dragon voice recognition software and may include unintentional dictation errors. ?  ?Sharman Cheek, MD ?07/09/21 0001 ? ?

## 2021-07-08 NOTE — ED Triage Notes (Addendum)
Pt brought in by wife from home.   Ex-Wife states pt is rubbing his head today.  Pt has dizziness and feels weak.  No n/v/d.  Pt poor historian.   ?

## 2021-07-08 NOTE — ED Notes (Signed)
Patient transported to CT 

## 2021-07-09 ENCOUNTER — Other Ambulatory Visit: Payer: Self-pay

## 2021-07-09 DIAGNOSIS — Z955 Presence of coronary angioplasty implant and graft: Secondary | ICD-10-CM | POA: Diagnosis not present

## 2021-07-09 DIAGNOSIS — Z7984 Long term (current) use of oral hypoglycemic drugs: Secondary | ICD-10-CM | POA: Diagnosis not present

## 2021-07-09 DIAGNOSIS — Z5989 Other problems related to housing and economic circumstances: Secondary | ICD-10-CM | POA: Diagnosis not present

## 2021-07-09 DIAGNOSIS — E1142 Type 2 diabetes mellitus with diabetic polyneuropathy: Secondary | ICD-10-CM

## 2021-07-09 DIAGNOSIS — Z8249 Family history of ischemic heart disease and other diseases of the circulatory system: Secondary | ICD-10-CM | POA: Diagnosis not present

## 2021-07-09 DIAGNOSIS — Z79890 Hormone replacement therapy (postmenopausal): Secondary | ICD-10-CM | POA: Diagnosis not present

## 2021-07-09 DIAGNOSIS — I2510 Atherosclerotic heart disease of native coronary artery without angina pectoris: Secondary | ICD-10-CM | POA: Diagnosis present

## 2021-07-09 DIAGNOSIS — E039 Hypothyroidism, unspecified: Secondary | ICD-10-CM | POA: Diagnosis present

## 2021-07-09 DIAGNOSIS — Z79899 Other long term (current) drug therapy: Secondary | ICD-10-CM | POA: Diagnosis not present

## 2021-07-09 DIAGNOSIS — E1165 Type 2 diabetes mellitus with hyperglycemia: Secondary | ICD-10-CM

## 2021-07-09 DIAGNOSIS — Z794 Long term (current) use of insulin: Secondary | ICD-10-CM | POA: Diagnosis not present

## 2021-07-09 DIAGNOSIS — E860 Dehydration: Secondary | ICD-10-CM | POA: Diagnosis present

## 2021-07-09 DIAGNOSIS — E785 Hyperlipidemia, unspecified: Secondary | ICD-10-CM | POA: Diagnosis present

## 2021-07-09 DIAGNOSIS — N179 Acute kidney failure, unspecified: Secondary | ICD-10-CM | POA: Diagnosis present

## 2021-07-09 DIAGNOSIS — Z20822 Contact with and (suspected) exposure to covid-19: Secondary | ICD-10-CM | POA: Diagnosis present

## 2021-07-09 DIAGNOSIS — R4182 Altered mental status, unspecified: Secondary | ICD-10-CM | POA: Diagnosis present

## 2021-07-09 DIAGNOSIS — I10 Essential (primary) hypertension: Secondary | ICD-10-CM | POA: Diagnosis present

## 2021-07-09 LAB — BASIC METABOLIC PANEL
Anion gap: 9 (ref 5–15)
BUN: 25 mg/dL — ABNORMAL HIGH (ref 8–23)
CO2: 26 mmol/L (ref 22–32)
Calcium: 8.8 mg/dL — ABNORMAL LOW (ref 8.9–10.3)
Chloride: 100 mmol/L (ref 98–111)
Creatinine, Ser: 1.74 mg/dL — ABNORMAL HIGH (ref 0.61–1.24)
GFR, Estimated: 42 mL/min — ABNORMAL LOW (ref >60)
Glucose, Bld: 289 mg/dL — ABNORMAL HIGH (ref 70–99)
Potassium: 3.8 mmol/L (ref 3.5–5.1)
Sodium: 135 mmol/L (ref 135–145)

## 2021-07-09 LAB — URINALYSIS, ROUTINE W REFLEX MICROSCOPIC
Bacteria, UA: NONE SEEN
Bilirubin Urine: NEGATIVE
Glucose, UA: >=500 mg/dL — AB
Ketones, ur: NEGATIVE mg/dL
Leukocytes,Ua: NEGATIVE
Nitrite: NEGATIVE
Protein, ur: 100 mg/dL — AB
Specific Gravity, Urine: 1.013 (ref 1.005–1.030)
pH: 5 (ref 5.0–8.0)

## 2021-07-09 LAB — GLUCOSE, CAPILLARY
Glucose-Capillary: 110 mg/dL — ABNORMAL HIGH (ref 70–99)
Glucose-Capillary: 170 mg/dL — ABNORMAL HIGH (ref 70–99)

## 2021-07-09 LAB — CBG MONITORING, ED
Glucose-Capillary: 263 mg/dL — ABNORMAL HIGH (ref 70–99)
Glucose-Capillary: 313 mg/dL — ABNORMAL HIGH (ref 70–99)
Glucose-Capillary: 286 mg/dL — ABNORMAL HIGH (ref 70–99)

## 2021-07-09 LAB — HEMOGLOBIN A1C
Hgb A1c MFr Bld: 8.1 % — ABNORMAL HIGH (ref 4.8–5.6)
Mean Plasma Glucose: 185.77 mg/dL

## 2021-07-09 LAB — CBC
HCT: 29.1 % — ABNORMAL LOW (ref 39.0–52.0)
Hemoglobin: 10 g/dL — ABNORMAL LOW (ref 13.0–17.0)
MCH: 29.5 pg (ref 26.0–34.0)
MCHC: 34.4 g/dL (ref 30.0–36.0)
MCV: 85.8 fL (ref 80.0–100.0)
Platelets: 208 10*3/uL (ref 150–400)
RBC: 3.39 MIL/uL — ABNORMAL LOW (ref 4.22–5.81)
RDW: 12.2 % (ref 11.5–15.5)
WBC: 6 10*3/uL (ref 4.0–10.5)
nRBC: 0 % (ref 0.0–0.2)

## 2021-07-09 MED ORDER — ACETAMINOPHEN 325 MG PO TABS
650.0000 mg | ORAL_TABLET | Freq: Four times a day (QID) | ORAL | Status: DC | PRN
  Filled 2021-07-09: qty 2

## 2021-07-09 MED ORDER — MORPHINE SULFATE (PF) 2 MG/ML IV SOLN
2.0000 mg | INTRAVENOUS | Status: DC | PRN
  Administered 2021-07-09 – 2021-07-10 (×5): 2 mg via INTRAVENOUS
  Filled 2021-07-09 (×5): qty 1

## 2021-07-09 MED ORDER — INSULIN ASPART 100 UNIT/ML IJ SOLN: 0.0000 [IU] | Freq: Every day | INTRAMUSCULAR | Status: DC

## 2021-07-09 MED ORDER — AMLODIPINE BESYLATE 5 MG PO TABS
5.0000 mg | ORAL_TABLET | Freq: Every day | ORAL | Status: DC
  Administered 2021-07-09 – 2021-07-12 (×4): 5 mg via ORAL
  Filled 2021-07-09 (×4): qty 1

## 2021-07-09 MED ORDER — INSULIN GLARGINE-YFGN 100 UNIT/ML ~~LOC~~ SOLN
5.0000 [IU] | Freq: Every day | SUBCUTANEOUS | Status: DC
  Administered 2021-07-09: 5 [IU] via SUBCUTANEOUS
  Filled 2021-07-09: qty 0.05

## 2021-07-09 MED ORDER — INSULIN GLARGINE-YFGN 100 UNIT/ML ~~LOC~~ SOLN
15.0000 [IU] | Freq: Two times a day (BID) | SUBCUTANEOUS | Status: DC
  Administered 2021-07-09 – 2021-07-12 (×6): 15 [IU] via SUBCUTANEOUS
  Filled 2021-07-09 (×6): qty 0.15

## 2021-07-09 MED ORDER — ONDANSETRON HCL 4 MG PO TABS: 4.0000 mg | ORAL_TABLET | Freq: Four times a day (QID) | ORAL | Status: DC | PRN

## 2021-07-09 MED ORDER — MAGNESIUM HYDROXIDE 400 MG/5ML PO SUSP: 30.0000 mL | Freq: Every day | ORAL | Status: DC | PRN

## 2021-07-09 MED ORDER — SODIUM CHLORIDE 0.9 % IV SOLN: INTRAVENOUS | Status: DC

## 2021-07-09 MED ORDER — LEVOTHYROXINE SODIUM 75 MCG PO TABS
75.0000 ug | ORAL_TABLET | Freq: Every day | ORAL | Status: DC
  Administered 2021-07-11 – 2021-07-12 (×2): 75 ug via ORAL
  Filled 2021-07-09 (×3): qty 1

## 2021-07-09 MED ORDER — ACETAMINOPHEN 650 MG RE SUPP: 650.0000 mg | Freq: Four times a day (QID) | RECTAL | Status: DC | PRN

## 2021-07-09 MED ORDER — ONDANSETRON HCL 4 MG/2ML IJ SOLN: 4.0000 mg | Freq: Four times a day (QID) | INTRAMUSCULAR | Status: DC | PRN

## 2021-07-09 MED ORDER — TRAZODONE HCL 50 MG PO TABS
25.0000 mg | ORAL_TABLET | Freq: Every evening | ORAL | Status: DC | PRN
  Administered 2021-07-09 – 2021-07-10 (×2): 25 mg via ORAL
  Filled 2021-07-09 (×2): qty 1

## 2021-07-09 MED ORDER — PROPRANOLOL HCL 20 MG PO TABS
40.0000 mg | ORAL_TABLET | Freq: Two times a day (BID) | ORAL | Status: DC
Start: 2021-07-09 — End: 2021-07-09

## 2021-07-09 MED ORDER — INSULIN ASPART 100 UNIT/ML IJ SOLN
0.0000 [IU] | Freq: Three times a day (TID) | INTRAMUSCULAR | Status: DC
  Administered 2021-07-09 (×2): 8 [IU] via SUBCUTANEOUS
  Administered 2021-07-10 (×2): 3 [IU] via SUBCUTANEOUS
  Administered 2021-07-10: 2 [IU] via SUBCUTANEOUS
  Administered 2021-07-11 – 2021-07-12 (×4): 3 [IU] via SUBCUTANEOUS
  Filled 2021-07-09 (×10): qty 1

## 2021-07-09 MED ORDER — ISOSORBIDE DINITRATE 10 MG PO TABS
30.0000 mg | ORAL_TABLET | Freq: Every day | ORAL | Status: DC
  Administered 2021-07-09 – 2021-07-12 (×4): 30 mg via ORAL
  Filled 2021-07-09: qty 1
  Filled 2021-07-09 (×3): qty 3

## 2021-07-09 MED ORDER — CITALOPRAM HYDROBROMIDE 20 MG PO TABS
20.0000 mg | ORAL_TABLET | Freq: Every day | ORAL | Status: DC
  Administered 2021-07-09 – 2021-07-12 (×4): 20 mg via ORAL
  Filled 2021-07-09 (×4): qty 1

## 2021-07-09 MED ORDER — ENOXAPARIN SODIUM 40 MG/0.4ML IJ SOSY
40.0000 mg | PREFILLED_SYRINGE | INTRAMUSCULAR | Status: DC
  Administered 2021-07-09 – 2021-07-12 (×4): 40 mg via SUBCUTANEOUS
  Filled 2021-07-09 (×4): qty 0.4

## 2021-07-09 MED ORDER — CARVEDILOL 6.25 MG PO TABS
3.1250 mg | ORAL_TABLET | Freq: Two times a day (BID) | ORAL | Status: DC
Start: 2021-07-09 — End: 2021-07-12
  Administered 2021-07-09 – 2021-07-12 (×7): 3.125 mg via ORAL
  Filled 2021-07-09 (×7): qty 1

## 2021-07-09 MED ORDER — PANTOPRAZOLE SODIUM 40 MG PO TBEC
40.0000 mg | DELAYED_RELEASE_TABLET | Freq: Every day | ORAL | Status: DC
  Administered 2021-07-09 – 2021-07-12 (×4): 40 mg via ORAL
  Filled 2021-07-09 (×4): qty 1

## 2021-07-09 NOTE — Care Management Obs Status (Signed)
MEDICARE OBSERVATION STATUS NOTIFICATION ? ? ?Patient Details  ?Name: Steve Alvarado ?MRN: [id number] ?Date of Birth: May 27, 1955 ? ? ?Medicare Observation Status Notification Given:  Yes ? ? ? ?Shelbie Hutching, RN ?07/09/2021, 11:50 AM ?

## 2021-07-09 NOTE — Progress Notes (Signed)
Inpatient Diabetes Program Recommendations ? ?AACE/ADA: New Consensus Statement on Inpatient Glycemic Control (2015) ? ?Target Ranges:  Prepandial:   less than 140 mg/dL ?     Peak postprandial:   less than 180 mg/dL (1-2 hours) ?     Critically ill patients:  140 - 180 mg/dL  ? ? Latest Reference Range & Units 07/08/21 22:15 07/09/21 07:48  ?Glucose-Capillary 70 - 99 mg/dL 623 (H) 762 (H)  ?(H): Data is abnormally high ? ? ? ?Admit with:  ?AKI (acute kidney injury) ?Uncontrolled type 2 diabetes mellitus with hyperglycemia ? ?History: DM ? ?Home DM Meds: Lantus 30 units BID ?        Humalog 3 units TID with meals ?       Metformin 1000 mg BID ? ?Current Orders: Novolog Moderate Correction Scale/ SSI (0-15 units) TID AC + HS ?     Semglee 5 units Daily ? ? ? ?MD- Note Semglee and Novolog to start this AM.   ? ?May consider increasing the Semglee to 50% home dose if CBGs remain elevated despite start of Insulins ? ? ? ? ?--Will follow patient during hospitalization-- ? ?Ambrose Finland RN, MSN, CDE ?Diabetes Coordinator ?Inpatient Glycemic Control Team ?Team Pager: [phone] (8a-5p) ? ?

## 2021-07-09 NOTE — Assessment & Plan Note (Signed)
We will continue Coreg and Norvasc while holding off Cozaar given AKI. ?

## 2021-07-09 NOTE — Progress Notes (Signed)
Brief hospitalist update note.  This is a nonbillable note.  Please see same-day H&P from Dr. Arville Care for full billable details. ? ?Briefly, this is a 66 year old male who came to the ED with chief complaint of altered mentation.  Also endorsed generalized weakness and fatigue with associated dizziness and lightheadedness.  Symptoms consistent with decreased p.o. intake and dehydration.  Patient does endorse body aches and headache.  Was started on intravenous fluids.  TOC engaged.  Home may be an unsafe living situation.  Will place in observation status, hydrate with normal saline, follow BMP, allow time for kidney function to resolve, remainder of care per H&P.  TOC, PT, OT, pastoral services all engaged. ? ?Lolita Patella MD ? ?No charge ?

## 2021-07-09 NOTE — Assessment & Plan Note (Signed)
-   We will continue Synthroid. 

## 2021-07-09 NOTE — TOC Progression Note (Signed)
Transition of Care (TOC) - Progression Note  ? ? ?Patient Details  ?Name: BARETT WHIDBEE ?MRN: [id number] ?Date of Birth: Aug 30, 1955 ? ?Transition of Care (TOC) CM/SW Contact  ?Chapman Fitch, RN ?Phone Number: ?07/09/2021, 3:58 PM ? ?Clinical Narrative:    ? ?Presented bed offers.  ?Patient accepts [facility] ?Per [facility] they do not accept admission over then weekend.   ?Auth Started in the [facility] portal with anticipated start date of 4/24 ? ?Expected Discharge Plan: Skilled Nursing Facility ?Barriers to Discharge: Continued Medical Work up ? ?Expected Discharge Plan and Services ?Expected Discharge Plan: Skilled Nursing Facility ?  ?Discharge Planning Services: CM Consult ?Post Acute Care Choice: Skilled Nursing Facility ?Living arrangements for the past 2 months: Apartment ?                ?DME Arranged: N/A ?DME Agency: NA ?  ?  ?  ?HH Arranged: NA ?HH Agency: NA ?  ?  ?  ? ? ?Social Determinants of Health (SDOH) Interventions ?  ? ?Readmission Risk Interventions ?   ? View : No data to display.  ?  ?  ?  ? ? ?

## 2021-07-09 NOTE — Evaluation (Signed)
Occupational Therapy Evaluation ?Patient Details ?Name: Steve Alvarado ?MRN: [id number] ?DOB: [birth year] ?Today's Date: 07/09/2021 ? ? ?History of Present Illness Steve Alvarado is a 66 y.o. African-American male with medical history significant for type 2 diabetes mellitus, hypertension, dyslipidemia, coronary artery disease and hypothyroidism, who presented to the ER with acute onset of generalized weakness and fatigue for about a week with associated dizziness and lightheadedness with standing.  ? ?Clinical Impression ?  ?Steve Alvarado was seen for OT evaluation this date. Pt presents with lethargy and concern for confusion. Pt initially able to state his name and birth month as January. He requires extensive cueing to provide birth day and year and is unable to state today's date. Pt will close his eyes and appear to sleep in the middle of answering a question. At other times he provides clear answers with relative ease. Pt's caregiver "[name]" at bedside also noted with some disorientation and is unable to state date to assist pt this am. She is unable to provide much meaningful information on pt baseline cognitive status. She elaborates on her housing situation and how she is unable to provide assistance for pt stating they are "not allowed to be together". When asked if pt/caregiver were court ordered to not be around one another, visitor at bedside states "Yes, but I lifted it because he is sick". Pt states he requires at least some assistance with most BADL management at baseline. At this point, pt visitor at bedside states "And see I can't do all that". Care team notified/aware of situation.  ? ?Currently pt demonstrates impairments as described below (See OT problem list) which functionally limit his ability to perform ADL/self-care tasks. Pt currently requires MIN A for bed mobility, MIN A for functional mobility during session with +1 HHA and multimodal cueing, anticipate MOD-MAX A +1 for LB ADL  management. Pt would benefit from skilled OT services to address noted impairments and functional limitations (see below for any additional details) in order to maximize safety and independence while minimizing falls risk and caregiver burden. Upon hospital discharge, recommend STR to maximize pt safety and return to PLOF.  ?  ?   ? ?Recommendations for follow up therapy are one component of a multi-disciplinary discharge planning process, led by the attending physician.  Recommendations may be updated based on patient status, additional functional criteria and insurance authorization.  ? ?Follow Up Recommendations ?    ?  ?Assistance Recommended at Discharge    ?Patient can return home with the following   ? ?  ?Functional Status Assessment ?    ?Equipment Recommendations ?    ?  ?Recommendations for Other Services   ? ? ?  ?Precautions / Restrictions Precautions ?Precautions: Fall ?Precaution Comments: Moderate fall ?Restrictions ?Weight Bearing Restrictions: No  ? ?  ? ?Mobility Bed Mobility ?Overal bed mobility: Needs Assistance ?Bed Mobility: Supine to Sit, Sit to Supine ?  ?  ?Supine to sit: Min assist ?Sit to supine: Supervision ?  ?General bed mobility comments: Min A for trunk elevation with multimodal cueing provided t/o mobility attempts. ?  ? ?Transfers ?Overall transfer level: Needs assistance ?Equipment used: 1 person hand held assist ?Transfers: Sit to/from Stand ?Sit to Stand: Min assist ?  ?  ?  ?  ?  ?  ?  ? ?  ?Balance Overall balance assessment: Needs assistance ?Sitting-balance support: Feet supported, Single extremity supported ?Sitting balance-Leahy Scale: Fair ?  ?  ?Standing balance support: During functional activity, Single extremity  supported ?Standing balance-Leahy Scale: Fair ?Standing balance comment: requires MIN A for standing balance at EOB. ?  ?  ?  ?  ?  ?  ?  ?  ?  ?  ?  ?   ? ?ADL either performed or assessed with clinical judgement  ? ?ADL Overall ADL's : Needs  assistance/impaired ?  ?  ?  ?  ?  ?  ?  ?  ?  ?  ?  ?  ?  ?  ?  ?  ?  ?  ?  ?General ADL Comments: Pt is functionally limited by generalized weakness, decreased activity tolerance, and impaired cognition. He requires MIN A for bed mobility with MAX multimodal cueing for sequencing and encouragement. MIN A for STS and side-stepping at EOB. Anticipate IN-MOD A for LB ADL management including LB dressing and bathing.  ? ? ? ?Vision Patient Visual Report: No change from baseline ?   ?   ?Perception   ?  ?Praxis   ?  ? ?Pertinent Vitals/Pain Pain Assessment ?Pain Assessment: 0-10 ?Pain Score: 9  ?Pain Location: unable to localize. "all over". ?Pain Intervention(s): Limited activity within patient's tolerance, Monitored during session, Repositioned, Patient requesting pain meds-RN notified  ? ? ? ?Hand Dominance   ?  ?Extremity/Trunk Assessment Upper Extremity Assessment ?Upper Extremity Assessment: Generalized weakness ?  ?Lower Extremity Assessment ?Lower Extremity Assessment: Generalized weakness ?  ?Cervical / Trunk Assessment ?Cervical / Trunk Assessment: Normal ?  ?Communication Communication ?Communication: No difficulties ?  ?Cognition Arousal/Alertness: Lethargic ?Behavior During Therapy: Flat affect ?Overall Cognitive Status: Difficult to assess ?  ?  ?  ?  ?  ?  ?  ?  ?  ?  ?  ?  ?  ?  ?  ?  ?General Comments: Pt initially able to state his name and birth month as January. Requires extensive cueing to provide birth day and year and is unable to state todays date. Pt will close his eyes and appear to sleep in the middle of answering a question. At other times he provides clear answers with relative ease. Pt's caregiver at bedside also noted with some disorientation and is unable to state date to assist pt this am. She is unable to provide much meaningful information on pt baseline cognitive status. She elaborates on her housing situation and how she is unable to provide assistand for pt stating they are "not  allowed to be together". MD/RN notified. ?  ?  ?General Comments  Pt endorses dizziness with sitting at EOB. BP noted to be 158/88 (103). Dizziness improves as pt sits EOB. ? ?  ?Exercises Other Exercises ?Other Exercises: Pt/caregiver educated on role of OT in acute setting, falls prevention strategies, and DC recs. ?  ?Shoulder Instructions    ? ? ?Home Living Family/patient expects to be discharged to:: Unsure ?Living Arrangements: Group Home ?  ?  ?  ?  ?  ?  ?  ?  ?  ?  ?  ?  ?  ?  ?  ?Additional Comments: Pt reports he was recently asked to leave his group home. He is unsure of his current living situation. Pt spouse at bedside indicates pt would be unable to live with her as she is living in [address] and pt "wouldn't be allowed". ?  ? ?  ?Prior Functioning/Environment Prior Level of Function : Needs assist ?  ?  ?  ?Physical Assist : Mobility (physical);ADLs (physical) ?Mobility (physical): Transfers;Gait ?  ADLs (physical): Dressing;Bathing;IADLs ?Mobility Comments: Pt is intermittently lethargic and appears disoriented to date/situation during session. endorses using 4WW at baseline and multiple falls. Also states he needs assistance for "everything" at his group home. ?ADLs Comments: Pt endorses group home staff provide assistance with "everything" including bathing, dressing, and funcitonal mobility. Endorses falls in last 6 months but does not elaborate. ?  ? ?  ?  ?OT Problem List: Decreased strength;Decreased coordination;Decreased cognition;Decreased activity tolerance;Decreased safety awareness;Impaired balance (sitting and/or standing);Decreased knowledge of use of DME or AE ?  ?   ?OT Treatment/Interventions: Self-care/ADL training;Therapeutic exercise;Therapeutic activities;DME and/or AE instruction;Patient/family education;Balance training;Energy conservation  ?  ?OT Goals(Current goals can be found in the care plan section) Acute Rehab OT Goals ?Patient Stated Goal: To feel better ?OT  Goal Formulation: With patient ?Time For Goal Achievement: 07/23/21 ?Potential to Achieve Goals: Good ?ADL Goals ?Pt Will Perform Lower Body Dressing: sit to/from stand;with set-up;with supervision;with adaptive equipment ?Pt Wi

## 2021-07-09 NOTE — Progress Notes (Signed)
?  07/09/21 0930  ?Clinical Encounter Type  ?Visited With Patient and family together;Health care provider  ?Visit Type Initial;Spiritual support;Social support  ?Referral From Family  ?Consult/Referral To Chaplain  ?Spiritual Encounters  ?Spiritual Needs Emotional;Other (Comment) ?(social support)  ? ?Chaplain Burris met with Pt and spouse who stated her name as "Thalia Bloodgood." (Past notes on previous visit references "ex-wife" so not clear on relationship but partner states that Pt has been residing with her for past three weeks after losing bed at [facility]. ? ?Daryel November escorted spouse to consult room to allow her to share concerns privately. She stated that Pt has been concerned about her "telling their business. She shared concerns for her own health, stress and mental health seems compromised in current arrangement. She stated that Pt's presence is threatening her status in [place] housing. She did share that some social supports are on-board (she talks with a "counselor" by phone). She has one adult daughter who seems supportive but Pt has also commented/monitored what she shares even with her as family. Other adult children not helpful and one recently faced incarceration. ? ?Daryel November offered compassionate presence and engaged with both active and reflective listening to foster a relationship of care and support. Daryel November then spoke with other members of care team to assess needs and what action might be appropriate. Chaplain B also offered prayer with spouse at her request and also hospitality (beverage and snack provided by chaplain). Will f/u as needed. ?

## 2021-07-09 NOTE — ED Notes (Signed)
MD at bedside. 

## 2021-07-09 NOTE — H&P (Signed)
Home ?  ?  ?Steve Alvarado ? ? ?PATIENT NAME: Steve Alvarado   ? ?MR#:  [id number] ? ?DATE OF BIRTH:  [birth year] ? ?DATE OF ADMISSION:  07/08/2021 ? ?PRIMARY CARE PHYSICIAN: Kingsley Callander, MD  ? ?Patient is coming from: Home ? ?REQUESTING/REFERRING PHYSICIAN: Brenton Grills, MD ? ?CHIEF COMPLAINT:  ? ?Chief Complaint  ?Patient presents with  ?? Altered Mental Status  ? ? ?HISTORY OF PRESENT ILLNESS:  ?Steve Alvarado is a 66 y.o. African-American male with medical history significant for type 2 diabetes mellitus, hypertension, dyslipidemia, coronary artery disease and hypothyroidism, who presented to the ER with acute onset of generalized weakness and fatigue for about a week with associated dizziness and lightheadedness with standing.  The patient has been having poor p.o. intake of food and fluids over the last several days.  He admits to body aches and headache.  No paresthesias or focal muscle weakness.  No nausea or vomiting or diarrhea.  No chest pain or palpitations.  No cough or wheezing or hemoptysis. ? ?ED Course: When he came to the ER BP was 148/97 with otherwise normal vital signs.  Labs revealed a BUN of 28 and creatinine 2.03 up from previous levels and sodium 131 with blood glucose of 405.  High-sensitivity troponin was 33.  CBC showed anemia close to previous levels.  UA was positive for 100 protein, more than 500 glucose 6-10 WBCs positive hyaline cast and negative nitrites. ?EKG as reviewed by me : EKG showed Sinus rhythm with a rate of 86 with PACs and minimal voltage criteria for LVH with T wave inversion laterally and Q waves inferiorly ?Imaging: Two-view chest x-ray showed no acute cardiopulmonary disease.  Noncontrasted head CT scan revealed no acute intracranial normalities.  Abdominal pelvic CT scan revealed no acute findings and normal appendix.  It showed advanced three-vessel coronary vascular calcification and aortic atherosclerosis. ? ?The patient was given 20 g of IV morphine  sulfate 1 L bolus of IV normal saline.  He will be admitted to a medical observation bed for further evaluation and management. ?PAST MEDICAL HISTORY:  ? ?Past Medical History:  ?Diagnosis Date  ?? Coronary artery disease   ?? Diabetes mellitus   ? Type II  ?? Hyperlipidemia   ?? Hypertension   ?? Thyroid disease   ? ? ?PAST SURGICAL HISTORY:  ? ?Past Surgical History:  ?Procedure Laterality Date  ?? CARDIAC CATHETERIZATION  08/06/2010  ? Bare metal stent placed in RCA.  ?? HAND SURGERY    ? left  ? ? ?SOCIAL HISTORY:  ? ?Social History  ? ?Tobacco Use  ?? Smoking status: Never  ?? Smokeless tobacco: Never  ?Substance Use Topics  ?? Alcohol use: No  ? ? ?FAMILY HISTORY:  ? ?Family History  ?Problem Relation Age of Onset  ?? Heart attack Mother   ? ? ?DRUG ALLERGIES:  ? ?Allergies  ?Allergen Reactions  ?? Ace Inhibitors Cough  ?  cough  ? ? ?REVIEW OF SYSTEMS:  ? ?ROS ?As per history of present illness. All pertinent systems were reviewed above. Constitutional, HEENT, cardiovascular, respiratory, GI, GU, musculoskeletal, neuro, psychiatric, endocrine, integumentary and hematologic systems were reviewed and are otherwise negative/unremarkable except for positive findings mentioned above in the HPI. ? ? ?MEDICATIONS AT HOME:  ? ?Prior to Admission medications   ?Medication Sig Start Date End Date Taking? Authorizing Provider  ?amLODipine (NORVASC) 5 MG tablet Take 5 mg by mouth daily. 05/19/21  Yes [provider]  ?atorvastatin (LIPITOR)  40 MG tablet Take 1 tablet (40 mg total) by mouth every evening. 10/23/20 07/08/21 Yes Max Sane, MD  ?carvedilol (COREG) 3.125 MG tablet Take 1 tablet (3.125 mg total) by mouth 2 (two) times daily with a meal. 10/23/20 07/08/21 Yes Max Sane, MD  ?gabapentin (NEURONTIN) 400 MG capsule Take 400 mg by mouth 3 (three) times daily. 06/03/21  Yes [provider]  ?insulin glargine (LANTUS) 100 UNIT/ML injection Inject 0.3 mLs (30 Units total) into the skin 2 (two) times  daily. 10/23/20  Yes Max Sane, MD  ?insulin lispro (HUMALOG) 100 UNIT/ML injection Inject 0.03 mLs (3 Units total) into the skin 3 (three) times daily with meals. 10/23/20  Yes Max Sane, MD  ?isosorbide dinitrate (ISORDIL) 30 MG tablet Take 30 mg by mouth daily. 06/09/21  Yes [provider]  ?levothyroxine (SYNTHROID) 75 MCG tablet Take 75 mcg by mouth daily. 05/19/21  Yes [provider]  ?lidocaine (LIDODERM) 5 % Place 1 patch onto the skin daily. Remove & Discard patch within 12 hours or as directed by MD 07/02/21 08/01/21 Yes Rada Hay, MD  ?losartan (COZAAR) 50 MG tablet Take 50 mg by mouth daily. 05/19/21  Yes [provider]  ?metFORMIN (GLUCOPHAGE) 1000 MG tablet Take 1,000 mg by mouth 2 (two) times daily. 05/19/21  Yes [provider]  ?pantoprazole (PROTONIX) 40 MG tablet Take 40 mg by mouth daily. 05/19/21  Yes [provider]  ?propranolol (INDERAL) 40 MG tablet Take 40 mg by mouth 2 (two) times daily. 05/19/21  Yes [provider]  ?citalopram (CELEXA) 20 MG tablet Take 1 tablet (20 mg total) by mouth daily. 10/23/20 11/22/20  Max Sane, MD  ? ?  ? ?VITAL SIGNS:  ?Blood pressure (!) 169/96, pulse 75, temperature 98.6 ?F (37 ?C), temperature source Oral, resp. rate 15, height 6\' 2"  (1.88 m), weight 99.8 kg, SpO2 96 %. ? ?PHYSICAL EXAMINATION:  ?Physical Exam ? ?GENERAL:  66 y.o.-year-old African-American male patient lying in the bed with no acute distress.  ?EYES: Pupils equal, round, reactive to light and accommodation. No scleral icterus. Extraocular muscles intact.  ?HEENT: Head atraumatic, normocephalic. Oropharynx and nasopharynx clear.  ?NECK:  Supple, no jugular venous distention. No thyroid enlargement, no tenderness.  ?LUNGS: Normal breath sounds bilaterally, no wheezing, rales,rhonchi or crepitation. No use of accessory muscles of respiration.  ?CARDIOVASCULAR: Regular rate and rhythm, S1, S2 normal. No murmurs, rubs, or gallops.  ?ABDOMEN:  Soft, nondistended, nontender. Bowel sounds present. No organomegaly or mass.  ?EXTREMITIES: No pedal edema, cyanosis, or clubbing.  ?NEUROLOGIC: Cranial nerves II through XII are intact. Muscle strength 5/5 in all extremities. Sensation intact. Gait not checked.  ?PSYCHIATRIC: The patient is alert and oriented x 3.  Normal affect and good eye contact. ?SKIN: No obvious rash, lesion, or ulcer.  ? ?LABORATORY PANEL:  ? ?CBC ?Recent Labs  ?Lab 07/09/21 ?0503  ?WBC 6.0  ?HGB 10.0*  ?HCT 29.1*  ?PLT 208  ? ?------------------------------------------------------------------------------------------------------------------ ? ?Chemistries  ?Recent Labs  ?Lab 07/08/21 ?2246 07/09/21 ?0503  ?NA  --  135  ?K  --  3.8  ?CL  --  100  ?CO2  --  26  ?GLUCOSE  --  289*  ?BUN  --  25*  ?CREATININE  --  1.74*  ?CALCIUM  --  8.8*  ?AST 23  --   ?ALT 20  --   ?ALKPHOS 77  --   ?BILITOT 1.4*  --   ? ?------------------------------------------------------------------------------------------------------------------ ? ?Cardiac Enzymes ?No  results for input(s): TROPONINI in the last 168 hours. ?------------------------------------------------------------------------------------------------------------------ ? ?RADIOLOGY:  ?CT ABDOMEN PELVIS WO CONTRAST ? ?Result Date: 07/08/2021 ?CLINICAL DATA:  Abdominal pain. EXAM: CT ABDOMEN AND PELVIS WITHOUT CONTRAST TECHNIQUE: Multidetector CT imaging of the abdomen and pelvis was performed following the standard protocol without IV contrast. RADIATION DOSE REDUCTION: This exam was performed according to the departmental dose-optimization program which includes automated exposure control, adjustment of the mA and/or kV according to patient size and/or use of iterative reconstruction technique. COMPARISON:  CT abdomen pelvis dated 06/13/2021. FINDINGS: Evaluation of this exam is limited in the absence of intravenous contrast. Lower chest: Minimal bibasilar dependent atelectasis. The visualized lung  bases are otherwise clear. There is advanced 3 vessel coronary vascular calcification. No intra-abdominal free air or free fluid. Hepatobiliary: No focal liver abnormality is seen. No gallstones, gallbladder wall

## 2021-07-09 NOTE — [place] FL2 (Signed)
?[place] MEDICAID FL2 LEVEL OF CARE SCREENING TOOL  ?  ? ?IDENTIFICATION  ?Patient Name: ?Steve Alvarado Birthdate: 1956/03/21 Sex: male Admission Date (Current Location): ?07/08/2021  ?[place] Number: ? [place] ?  Facility and Address:  ?[facility], [address] [place], [place] [address] ?     Provider Number: ?[id number]  ?Attending Physician Name and Address:  ?Tresa Moore, MD ? Relative Name and Phone Number:  ?Zae Kirtz- ex wife- [phone] ?   ?Current Level of Care: ?Hospital Recommended Level of Care: ?Skilled Nursing Facility Prior Approval Number: ?  ? ?Date Approved/Denied: ?  PASRR Number: ?[id number] ? ?Discharge Plan: ?SNF ?  ? ?Current Diagnoses: ?Patient Active Problem List  ? Diagnosis Date Noted  ? Uncontrolled type 2 diabetes mellitus with hyperglycemia, with long-term current use of insulin (HCC) 07/09/2021  ? Hypothyroidism 07/09/2021  ? AKI (acute kidney injury) (HCC) 07/08/2021  ? Chest pain 10/21/2020  ? Depression 10/21/2020  ? Non compliance w medication regimen 10/21/2020  ? Cocaine abuse (HCC) 09/05/2020  ? Adjustment disorder with emotional disturbance 09/05/2020  ? Hypertensive urgency 03/08/2015  ? Essential hypertension 02/14/2011  ? CAD (coronary artery disease) 08/13/2010  ? Stented coronary artery 08/13/2010  ? Diabetes mellitus (HCC) 08/13/2010  ? ? ?Orientation RESPIRATION BLADDER Height & Weight   ?  ?Self, Time, Situation, Place ? Normal Continent Weight: 99.8 kg ?Height:  6\' 2"  (188 cm)  ?BEHAVIORAL SYMPTOMS/MOOD NEUROLOGICAL BOWEL NUTRITION STATUS  ?    Continent Diet (Regular)  ?AMBULATORY STATUS COMMUNICATION OF NEEDS Skin   ?Extensive Assist Verbally Normal ?  ?  ?  ?    ?     ?     ? ? ?Personal Care Assistance Level of Assistance  ?Bathing, Feeding, Dressing Bathing Assistance: Limited assistance ?Feeding assistance: Limited assistance ?Dressing Assistance: Limited assistance ?   ? ?Functional Limitations  Info  ?Sight, Hearing, Speech Sight Info: Adequate ?Hearing Info: Adequate ?Speech Info: Adequate  ? ? ?SPECIAL CARE FACTORS FREQUENCY  ?PT (By licensed PT), OT (By licensed OT)   ?  ?PT Frequency: 5 times per week ?OT Frequency: 5 times per week ?  ?  ?  ?   ? ? ?Contractures Contractures Info: Not present  ? ? ?Additional Factors Info  ?Code Status, Allergies Code Status Info: Full ?Allergies Info: Ace inhibitors ?  ?  ?  ?   ? ?Current Medications (07/09/2021):  This is the current hospital active medication list ?Current Facility-Administered Medications  ?Medication Dose Route Frequency Provider Last Rate Last Admin  ? 0.9 %  sodium chloride infusion   Intravenous Continuous Mansy, Jan A, MD 100 mL/hr at 07/09/21 1054 Rate Verify at 07/09/21 1054  ? acetaminophen (TYLENOL) tablet 650 mg  650 mg Oral Q6H PRN Mansy, Jan A, MD      ? Or  ? acetaminophen (TYLENOL) suppository 650 mg  650 mg Rectal Q6H PRN Mansy, Jan A, MD      ? amLODipine (NORVASC) tablet 5 mg  5 mg Oral Daily Feb B, MD   5 mg at 07/09/21 0915  ? carvedilol (COREG) tablet 3.125 mg  3.125 mg Oral BID WC 07/11/21 B, MD   3.125 mg at 07/09/21 07/11/21  ? citalopram (CELEXA) tablet 20 mg  20 mg Oral Daily [id number], Sudheer B, MD   20 mg at 07/09/21 0915  ? enoxaparin (LOVENOX) injection 40 mg  40 mg Subcutaneous Q24H Mansy, 07/11/21, MD  40 mg at 07/09/21 0915  ? insulin aspart (novoLOG) injection 0-15 Units  0-15 Units Subcutaneous TID WC Lolita Patella B, MD   8 Units at 07/09/21 0915  ? insulin aspart (novoLOG) injection 0-5 Units  0-5 Units Subcutaneous QHS Sreenath, Sudheer B, MD      ? insulin glargine-yfgn (SEMGLEE) injection 5 Units  5 Units Subcutaneous Daily Sreenath, Sudheer B, MD      ? isosorbide dinitrate (ISORDIL) tablet 30 mg  30 mg Oral Daily Lolita Patella B, MD   30 mg at 07/09/21 4142  ? [START ON 07/10/2021] levothyroxine (SYNTHROID) tablet 75 mcg  75 mcg Oral Daily Sreenath, Sudheer B, MD      ? magnesium  hydroxide (MILK OF MAGNESIA) suspension 30 mL  30 mL Oral Daily PRN Mansy, Jan A, MD      ? morphine (PF) 2 MG/ML injection 2 mg  2 mg Intravenous Q4H PRN Mansy, Jan A, MD   2 mg at 07/09/21 0433  ? ondansetron (ZOFRAN) tablet 4 mg  4 mg Oral Q6H PRN Mansy, Jan A, MD      ? Or  ? ondansetron [facility]) injection 4 mg  4 mg Intravenous Q6H PRN Mansy, Jan A, MD      ? pantoprazole (PROTONIX) EC tablet 40 mg  40 mg Oral Daily Sreenath, Sudheer B, MD      ? traZODone (DESYREL) tablet 25 mg  25 mg Oral QHS PRN Mansy, Vernetta Honey, MD      ? ?Current Outpatient Medications  ?Medication Sig Dispense Refill  ? amLODipine (NORVASC) 5 MG tablet Take 5 mg by mouth daily.    ? atorvastatin (LIPITOR) 40 MG tablet Take 1 tablet (40 mg total) by mouth every evening. 30 tablet 0  ? carvedilol (COREG) 3.125 MG tablet Take 1 tablet (3.125 mg total) by mouth 2 (two) times daily with a meal. 60 tablet 0  ? gabapentin (NEURONTIN) 400 MG capsule Take 400 mg by mouth 3 (three) times daily.    ? insulin glargine (LANTUS) 100 UNIT/ML injection Inject 0.3 mLs (30 Units total) into the skin 2 (two) times daily. 10 mL 11  ? insulin lispro (HUMALOG) 100 UNIT/ML injection Inject 0.03 mLs (3 Units total) into the skin 3 (three) times daily with meals. 10 mL 11  ? isosorbide dinitrate (ISORDIL) 30 MG tablet Take 30 mg by mouth daily.    ? levothyroxine (SYNTHROID) 75 MCG tablet Take 75 mcg by mouth daily.    ? lidocaine (LIDODERM) 5 % Place 1 patch onto the skin daily. Remove & Discard patch within 12 hours or as directed by MD 30 patch 0  ? losartan (COZAAR) 50 MG tablet Take 50 mg by mouth daily.    ? metFORMIN (GLUCOPHAGE) 1000 MG tablet Take 1,000 mg by mouth 2 (two) times daily.    ? pantoprazole (PROTONIX) 40 MG tablet Take 40 mg by mouth daily.    ? propranolol (INDERAL) 40 MG tablet Take 40 mg by mouth 2 (two) times daily.    ? citalopram (CELEXA) 20 MG tablet Take 1 tablet (20 mg total) by mouth daily. 30 tablet 0  ? ? ? ?Discharge  Medications: ?Please see discharge summary for a list of discharge medications. ? ?Relevant Imaging Results: ? ?Relevant Lab Results: ? ? ?Additional Information ?SS# [id number] ? ?Allayne Butcher, RN ? ? ? ? ?

## 2021-07-09 NOTE — Evaluation (Signed)
Physical Therapy Evaluation ?Patient Details ?Name: Steve Alvarado ?MRN: [id number] ?DOB: [birth year] ?Today's Date: 07/09/2021 ? ?History of Present Illness ? Pt is a 66 y/o M admitted on 07/08/21 after presenting to the ED with c/o acute onset of generalized weakness & fatigue x ~1 week as well as associated dizziness & lightheadedness with standing. Pt is being treated for AKI & uncontrolled DM2 with hyperglycemia. PMH: DM2, HTN, dyslipidemia, CAD, hypothyroidism  ?Clinical Impression ? Pt seen for PT evaluation with pt reporting wife/ex-wife ([name]) is present for session. Pt with very flat affect & significantly delayed responses during session, with pt requiring encouragement. After speaking with pt & staff member from [facility], pt was living at [facility] from September 2022 to April 17th, 2023, then pt d/c home with possibly his ex-wife. On this date, pt is able to complete bed mobility without physical assistance but requires min assist for STS & gait with RW. Pt appears to be fatigued after short distance gait with RW & requests to return to room. Pt demonstrates absent dorsiflexion & heel strike BLE during gait but reports he didn't wear braces before. Pt would benefit from STR upon d/c to maximize independence with functional mobility & reduce fall risk prior to return home.   ? ?Recommendations for follow up therapy are one component of a multi-disciplinary discharge planning process, led by the attending physician.  Recommendations may be updated based on patient status, additional functional criteria and insurance authorization. ? ?Follow Up Recommendations Skilled nursing-short term rehab (<3 hours/day) ? ?  ?Assistance Recommended at Discharge Frequent or constant Supervision/Assistance  ?Patient can return home with the following ? A little help with walking and/or transfers;A little help with bathing/dressing/bathroom;Assistance with cooking/housework;Assist for transportation;Direct  supervision/assist for financial management;Direct supervision/assist for medications management;Help with stairs or ramp for entrance ? ?  ?Equipment Recommendations None recommended by PT  ?Recommendations for Other Services ?    ?  ?Functional Status Assessment Patient has had a recent decline in their functional status and demonstrates the ability to make significant improvements in function in a reasonable and predictable amount of time.  ? ?  ?Precautions / Restrictions Precautions ?Precautions: Fall ?Precaution Comments: Moderate fall ?Restrictions ?Weight Bearing Restrictions: No  ? ?  ? ?Mobility ? Bed Mobility ?Overal bed mobility: Needs Assistance ?Bed Mobility: Supine to Sit, Sit to Supine ?  ?  ?Supine to sit: Supervision, HOB elevated ?Sit to supine: Supervision, HOB elevated ?  ?  ?  ? ?Transfers ?Overall transfer level: Needs assistance ?Equipment used: Rolling walker (2 wheels) ?Transfers: Sit to/from Stand ?Sit to Stand: Min assist ?  ?  ?  ?  ?  ?General transfer comment: Cuing for hand placement, cuing to initiate STS. ?  ? ?Ambulation/Gait ?Ambulation/Gait assistance: Min assist ?Gait Distance (Feet): 35 Feet ?Assistive device: Rolling walker (2 wheels) ?Gait Pattern/deviations: Decreased step length - right, Decreased step length - left, Decreased stride length, Decreased dorsiflexion - right, Decreased dorsiflexion - left, Trunk flexed ?Gait velocity: decreased ?  ?  ?General Gait Details: Increased BLE hip & knee flexion to compensate for absent dorsiflexion BLE. ? ?Stairs ?  ?  ?  ?  ?  ? ?Wheelchair Mobility ?  ? ?Modified Rankin (Stroke Patients Only) ?  ? ?  ? ?Balance Overall balance assessment: Needs assistance ?Sitting-balance support: Feet supported, Bilateral upper extremity supported ?Sitting balance-Leahy Scale: Good ?  ?  ?Standing balance support: Bilateral upper extremity supported, During functional activity ?Standing balance-Leahy  Scale: Fair ?  ?  ?  ?  ?  ?  ?  ?  ?  ?  ?  ?   ?   ? ? ? ?Pertinent Vitals/Pain Pain Assessment ?Pain Assessment: Faces ?Faces Pain Scale: Hurts a little bit ?Pain Location: "all over" unable to describe further ?Pain Descriptors / Indicators: Discomfort ?Pain Intervention(s): Monitored during session  ? ? ?Home Living Family/patient expects to be discharged to:: Unsure ?Living Arrangements: Group Home ?  ?  ?  ?  ?  ?  ?  ?  ?Additional Comments: Prior to admission pt was staying at an ALF since September 2022 but recently discharged on April 17th. Pt was living with his (?) wife/ex-wife ([name] Haberman) following d/c from ALF. Unsure of that home set-up  ?  ?Prior Function Prior Level of Function : Needs assist ?  ?  ?  ?Physical Assist : Mobility (physical);ADLs (physical) ?Mobility (physical): Transfers;Gait ?ADLs (physical): Dressing;Bathing;IADLs ?Mobility Comments: Staff at [facility] reports pt was mod I with rollator but would primarily use w/c & could transfer to w/c on his own. ?ADLs Comments: Unsure, reports Regulatory affairs officer assisted with donning socks & she confirms this, but then states staff at [facility] assisted him. Staff at [employer] [facility] reports pt was independent with tasks. ?  ? ? ?Hand Dominance  ?   ? ?  ?Extremity/Trunk Assessment  ? Upper Extremity Assessment ?Upper Extremity Assessment: Generalized weakness;Difficult to assess due to impaired cognition ?  ? ?Lower Extremity Assessment ?Lower Extremity Assessment: Generalized weakness;Difficult to assess due to impaired cognition (Pt appears to have BLE foot drop during gait, 1/5 ankle dorsiflexion BLE.) ?  ? ?Cervical / Trunk Assessment ?Cervical / Trunk Assessment: Normal  ?Communication  ? Communication: No difficulties  ?Cognition Arousal/Alertness: Lethargic ?Behavior During Therapy: Flat affect ?Overall Cognitive Status: Difficult to assess ?  ?  ?  ?  ?  ?  ?  ?  ?  ?  ?  ?  ?  ?  ?  ?  ?General Comments: Pt is oriented to self (name, age, birthday) but unable to recall current  year or situation. Flat affect, significantly delayed responses with PT asking same question multiple times throughout session. ?  ?  ? ?  ?General Comments General comments (skin integrity, edema, etc.): SpO2 > 90% on room air, endorses fatigue after gait. ? ?  ?Exercises    ? ?Assessment/Plan  ?  ?PT Assessment Patient needs continued PT services  ?PT Problem List Decreased strength;Decreased coordination;Cardiopulmonary status limiting activity;Decreased range of motion;Decreased cognition;Decreased activity tolerance;Decreased balance;Decreased mobility;Decreased knowledge of use of DME;Decreased safety awareness;Decreased knowledge of precautions ? ?   ?  ?PT Treatment Interventions DME instruction;Therapeutic exercise;Gait training;Balance training;Stair training;Functional mobility training;Therapeutic activities;Patient/family education;Cognitive remediation;Neuromuscular re-education   ? ?PT Goals (Current goals can be found in the Care Plan section)  ?Acute Rehab PT Goals ?Patient Stated Goal: none stated ?PT Goal Formulation: With patient ?Time For Goal Achievement: 07/23/21 ?Potential to Achieve Goals: Fair ? ?  ?Frequency Min 2X/week ?  ? ? ?Co-evaluation   ?  ?  ?  ?  ? ? ?  ?AM-PAC PT "6 Clicks" Mobility  ?Outcome Measure Help needed turning from your back to your side while in a flat bed without using bedrails?: None ?Help needed moving from lying on your back to sitting on the side of a flat bed without using bedrails?: A Little ?Help needed moving to and from a bed to a  chair (including a wheelchair)?: A Little ?Help needed standing up from a chair using your arms (e.g., wheelchair or bedside chair)?: A Little ?Help needed to walk in hospital room?: A Little ?Help needed climbing 3-5 steps with a railing? : A Lot ?6 Click Score: 18 ? ?  ?End of Session   ?Activity Tolerance: Patient limited by fatigue ?Patient left: in bed;with call bell/phone within reach;with bed alarm set;with family/visitor  present ?Nurse Communication: Mobility status ?PT Visit Diagnosis: Unsteadiness on feet (R26.81);Muscle weakness (generalized) (M62.81) ?  ? ?Time: [id number] ?PT Time Calculation (min) (ACUTE ONLY): 18 mi

## 2021-07-09 NOTE — Assessment & Plan Note (Signed)
-   We will continue Imdur and Coreg. ?

## 2021-07-09 NOTE — TOC Initial Note (Signed)
Transition of Care (TOC) - Initial/Assessment Note  ? ? ?Patient Details  ?Name: Steve Alvarado ?MRN: [id number] ?Date of Birth: [birth year] ? ?Transition of Care (TOC) CM/SW Contact:    ?Shelbie Hutching, RN ?Phone Number: ?07/09/2021, 12:13 PM ? ?Clinical Narrative:                 ?Patient came into the emergency room with altered mental status and AKI.  RNCM met with patient and his ex wife at the bedside.  They report that patient has been staying with the ex wife for a short time but that he cannot continue to stay there because of the section 8 housing, if they found out she could be evicted.  At one time patient lived a [facility] but he was gone more than he was there and did not pay his bill so he was discharged 4/18. ?When patient asked what he was going to do at discharge he said he would have to call his sister and maybe stay with her.  Therapy recommendation is for SNF- patient agrees to short term rehab and then can figure out what he will do after that, patient does have Medicaid which could always be a program change to long term care and cover LTC SNF.   ?Bed search started for SNF.   ? ?Expected Discharge Plan: [place] ?Barriers to Discharge: Continued Medical Work up ? ? ?Patient Goals and CMS Choice ?Patient states their goals for this hospitalization and ongoing recovery are:: Patient agrees to SNF for short term rehab ?CMS Medicare.gov Compare Post Acute Care list provided to:: Patient ?Choice offered to / list presented to : Patient ? ?Expected Discharge Plan and Services ?Expected Discharge Plan: [place] ?  ?Discharge Planning Services: CM Consult ?Post Acute Care Choice: [place] ?Living arrangements for the past 2 months: Apartment ?                ?DME Arranged: N/A ?DME Agency: NA ?  ?  ?  ?HH Arranged: NA ?[place] Agency: NA ?  ?  ?  ? ?Prior Living Arrangements/Services ?Living arrangements for the past 2 months: Apartment ?Lives with::  Significant Other ?Patient language and need for interpreter reviewed:: Yes ?Do you feel safe going back to the place where you live?: Yes      ?Need for Family Participation in Patient Care: Yes (Comment) ?Care giver support system in place?: Yes (comment) ?Current home services: DME (rollator) ?Criminal Activity/Legal Involvement Pertinent to Current Situation/Hospitalization: No - Comment as needed ? ?Activities of Daily Living ?Home Assistive Devices/Equipment: None ?ADL Screening (condition at time of admission) ?Patient's cognitive ability adequate to safely complete daily activities?: Yes ?Is the patient deaf or have difficulty hearing?: No ?Does the patient have difficulty seeing, even when wearing glasses/contacts?: No ?Does the patient have difficulty concentrating, remembering, or making decisions?: No ?Patient able to express need for assistance with ADLs?: Yes ?Does the patient have difficulty dressing or bathing?: No ?Independently performs ADLs?: Yes (appropriate for developmental age) ?Does the patient have difficulty walking or climbing stairs?: No ?Weakness of Legs: None ?Weakness of Arms/Hands: None ? ?Permission Sought/Granted ?Permission sought to share information with : Case Manager, Customer service manager, Family Supports ?Permission granted to share information with : Yes, Verbal Permission Granted ? Share Information with NAME: Rowena Lograsso ? Permission granted to share info w AGENCY: SNF's ? Permission granted to share info w Relationship: ex wife ? Permission granted to share  info w Contact Information: [phone] ? ?Emotional Assessment ?Appearance:: Appears stated age ?Attitude/Demeanor/Rapport: Guarded ?Affect (typically observed): Withdrawn, Quiet ?Orientation: : Oriented to Self, Oriented to Place, Oriented to  Time, Oriented to Situation ?Alcohol / Substance Use: Not Applicable ?Psych Involvement: No (comment) ? ?Admission diagnosis:  AKI (acute kidney injury) ([place])  [N17.9] ?Patient Active Problem List  ? Diagnosis Date Noted  ? Uncontrolled type 2 diabetes mellitus with hyperglycemia, with long-term current use of insulin ([place]) 07/09/2021  ? Hypothyroidism 07/09/2021  ? AKI (acute kidney injury) ([place]) 07/08/2021  ? Chest pain 10/21/2020  ? Depression 10/21/2020  ? Non compliance w medication regimen 10/21/2020  ? Cocaine abuse ([place]) 09/05/2020  ? Adjustment disorder with emotional disturbance 09/05/2020  ? Hypertensive urgency 03/08/2015  ? Essential hypertension 02/14/2011  ? CAD (coronary artery disease) 08/13/2010  ? Stented coronary artery 08/13/2010  ? Diabetes mellitus ([place]) 08/13/2010  ? ?PCP:  Kingsley Callander, MD ?Pharmacy:   ?[facility] [place] [facility] [place] ?[address] ([place]) [place] [address] ?Phone: [phone] Fax: [phone] ? ?[facility] ?[address], [address] ?[place] [address] ?Phone: [phone] Fax: [phone] ? ?[place] ?[address] ?[place] [address] ?Phone: [phone] Fax: [phone] ? ? ? ? ?Social Determinants of Health (SDOH) Interventions ?  ? ?Readmission Risk Interventions ?   ? View : No data to display.  ?  ?  ?  ? ? ? ?

## 2021-07-09 NOTE — Assessment & Plan Note (Addendum)
-   The patient will be placed on supplement coverage with NovoLog and we will continue basal coverage. ?- We will hold metformin off given AKI. ?

## 2021-07-09 NOTE — Assessment & Plan Note (Signed)
-   The patient will be admitted to an observation medical bed. ?- We will continue hydration with IV normal saline. ?- We will follow BMP. ?- We will avoid nephrotoxins. ?

## 2021-07-10 LAB — CBC WITH DIFFERENTIAL/PLATELET
Abs Immature Granulocytes: 0.02 10*3/uL (ref 0.00–0.07)
Basophils Absolute: 0 10*3/uL (ref 0.0–0.1)
Basophils Relative: 1 %
Eosinophils Absolute: 0.3 10*3/uL (ref 0.0–0.5)
Eosinophils Relative: 6 %
HCT: 30.3 % — ABNORMAL LOW (ref 39.0–52.0)
Hemoglobin: 10.6 g/dL — ABNORMAL LOW (ref 13.0–17.0)
Immature Granulocytes: 0 %
Lymphocytes Relative: 35 %
Lymphs Abs: 2 10*3/uL (ref 0.7–4.0)
MCH: 29.8 pg (ref 26.0–34.0)
MCHC: 35 g/dL (ref 30.0–36.0)
MCV: 85.1 fL (ref 80.0–100.0)
Monocytes Absolute: 0.5 10*3/uL (ref 0.1–1.0)
Monocytes Relative: 9 %
Neutro Abs: 2.8 10*3/uL (ref 1.7–7.7)
Neutrophils Relative %: 49 %
Platelets: 220 10*3/uL (ref 150–400)
RBC: 3.56 MIL/uL — ABNORMAL LOW (ref 4.22–5.81)
RDW: 12.2 % (ref 11.5–15.5)
WBC: 5.6 10*3/uL (ref 4.0–10.5)
nRBC: 0 % (ref 0.0–0.2)

## 2021-07-10 LAB — BASIC METABOLIC PANEL
Anion gap: 8 (ref 5–15)
BUN: 27 mg/dL — ABNORMAL HIGH (ref 8–23)
CO2: 24 mmol/L (ref 22–32)
Calcium: 8.7 mg/dL — ABNORMAL LOW (ref 8.9–10.3)
Chloride: 105 mmol/L (ref 98–111)
Creatinine, Ser: 1.56 mg/dL — ABNORMAL HIGH (ref 0.61–1.24)
GFR, Estimated: 48 mL/min — ABNORMAL LOW (ref >60)
Glucose, Bld: 146 mg/dL — ABNORMAL HIGH (ref 70–99)
Potassium: 3.5 mmol/L (ref 3.5–5.1)
Sodium: 137 mmol/L (ref 135–145)

## 2021-07-10 LAB — GLUCOSE, CAPILLARY
Glucose-Capillary: 156 mg/dL — ABNORMAL HIGH (ref 70–99)
Glucose-Capillary: 147 mg/dL — ABNORMAL HIGH (ref 70–99)
Glucose-Capillary: 154 mg/dL — ABNORMAL HIGH (ref 70–99)
Glucose-Capillary: 193 mg/dL — ABNORMAL HIGH (ref 70–99)

## 2021-07-10 NOTE — Progress Notes (Signed)
?PROGRESS NOTE ? ? ? ?Steve Alvarado  HER:[id number] DOB: 03-15-1956 DOA: 07/08/2021 ?PCP: Ellery Plunk, MD  ? ? ?Brief Narrative:  ?66 year old male who came to the ED with chief complaint of altered mentation.  Also endorsed generalized weakness and fatigue with associated dizziness and lightheadedness.  Symptoms consistent with decreased p.o. intake and dehydration.  Patient does endorse body aches and headache.  Was started on intravenous fluids.  TOC engaged.  Home may be an unsafe living situation.  Will place in observation status, hydrate with normal saline, follow BMP, allow time for kidney function to resolve, remainder of care per H&P.  TOC, PT, OT, pastoral services all engaged. ? ?Patient remained hemodynamically stable.  Kidney function improved.  Insurance authorization obtained.  Patient will discharge to [facility].  Unfortunately they do not accept patients over the weekend.  Plan to discharge Monday 4/24. ? ? ?Assessment & Plan: ?  ?Principal Problem: ?  AKI (acute kidney injury) (HCC) ?Active Problems: ?  Uncontrolled type 2 diabetes mellitus with hyperglycemia, with long-term current use of insulin (HCC) ?  CAD (coronary artery disease) ?  Essential hypertension ?  Hypothyroidism ? ?Acute kidney injury ?Suspect prerenal azotemia.  Has responded to IV fluid resuscitation ?Plan: ?DC IVF ?Follow BMP ?Avoid nephrotoxins ? ?Diabetes mellitus type 2 uncontrolled with hyperglycemia ?Basal bolus regimen ?Carb modified diet ?Hold metformin ? ?Hypothyroidism ?PTA Synthroid 8 ?  ?Essential hypertension ?PTA Coreg and Norvasc ?Hold home ACE inhibitor, can likely restart 4/23 ?  ?CAD (coronary artery disease) ?PTA Coreg and Imdur ? ? ?DVT prophylaxis: SQ Lovenox ?Code Status: Full ?Family Communication: Family member at bedside 4/21 ?Disposition Plan: Status is: Inpatient ?Remains inpatient appropriate because: Unsafe discharge plan.  Medically stable.  Will plan for discharge to [facility].  Discharge plan to 4/24. ? ? ?Level of care: Med-Surg ? ?Consultants:  ?None ? ?Procedures:  ?None ? ?Antimicrobials: ?None ? ? ?Subjective: ?Patient seen and examined.  Sleepy this morning.  No visible distress.  No pain complaints. ? ?Objective: ?Vitals:  ? 07/09/21 2003 07/09/21 2005 07/10/21 0413 07/10/21 0800  ?BP: (!) 154/100 (!) 155/89 128/73 135/69  ?Pulse: 73 73 69 72  ?Resp:   16 18  ?Temp: 98.2 ?F (36.8 ?C)  98.3 ?F (36.8 ?C) 98.8 ?F (37.1 ?C)  ?TempSrc: Oral   Oral  ?SpO2: 98% 100% 100% 100%  ?Weight:      ?Height:      ? ? ?Intake/Output Summary (Last 24 hours) at 07/10/2021 1143 ?Last data filed at 07/10/2021 1022 ?Gross per 24 hour  ?Intake 1300 ml  ?Output --  ?Net 1300 ml  ? ?Filed Weights  ? 07/08/21 2058  ?Weight: 99.8 kg  ? ? ?Examination: ? ?General exam: NAD.  Appears chronically ill ?Respiratory system: Poor respiratory effort.  Lungs clear.  Normal work of breathing.  Room air ?Cardiovascular system: S1-S2, RRR, no murmurs, no pedal edema ?Gastrointestinal system: Soft, NT/ND, normal bowel sounds ?Central nervous system: Alert and oriented. No focal neurological deficits. ?Extremities: Symmetric 5 x 5 power. ?Skin: No rashes, lesions or ulcers ?Psychiatry: Judgement and insight appear normal. Mood & affect appropriate.  ? ? ? ?Data Reviewed: I have personally reviewed following labs and imaging studies ? ?CBC: ?Recent Labs  ?Lab 07/08/21 ?2103 07/09/21 ?8563 07/10/21 ?0606  ?WBC 5.7 6.0 5.6  ?NEUTROABS  --   --  2.8  ?HGB 10.1* 10.0* 10.6*  ?HCT 32.9* 29.1* 30.3*  ?MCV 86.4 85.8 85.1  ?PLT  343 208 220  ? ?Basic Metabolic Panel: ?Recent Labs  ?Lab 07/08/21 ?2103 07/09/21 ?[id number] 07/10/21 ?0606  ?NA 131* 135 137  ?K 4.0 3.8 3.5  ?CL 95* 100 105  ?CO2 24 26 24   ?GLUCOSE 405* 289* 146*  ?BUN 28* 25* 27*  ?CREATININE 2.03* 1.74* 1.56*  ?CALCIUM 9.6 8.8* 8.7*  ? ?GFR: ?Estimated Creatinine Clearance: 58 mL/min (A) (by C-G formula based on SCr of 1.56 mg/dL (H)). ?Liver Function Tests: ?Recent  Labs  ?Lab 07/08/21 ?2246  ?AST 23  ?ALT 20  ?ALKPHOS 77  ?BILITOT 1.4*  ?PROT 7.8  ?ALBUMIN 4.1  ? ?Recent Labs  ?Lab 07/08/21 ?2246  ?LIPASE 31  ? ?No results for input(s): AMMONIA in the last 168 hours. ?Coagulation Profile: ?No results for input(s): INR, PROTIME in the last 168 hours. ?Cardiac Enzymes: ?Recent Labs  ?Lab 07/08/21 ?2246  ?CKTOTAL 497*  ? ?BNP (last 3 results) ?No results for input(s): PROBNP in the last 8760 hours. ?HbA1C: ?Recent Labs  ?  07/09/21 ?0503  ?HGBA1C 8.1*  ? ?CBG: ?Recent Labs  ?Lab 07/09/21 ?0951 07/09/21 ?1152 07/09/21 ?1620 07/09/21 ?2045 07/10/21 ?0757  ?GLUCAP 313* 286* 110* 170* 156*  ? ?Lipid Profile: ?No results for input(s): CHOL, HDL, LDLCALC, TRIG, CHOLHDL, LDLDIRECT in the last 72 hours. ?Thyroid Function Tests: ?No results for input(s): TSH, T4TOTAL, FREET4, T3FREE, THYROIDAB in the last 72 hours. ?Anemia Panel: ?No results for input(s): VITAMINB12, FOLATE, FERRITIN, TIBC, IRON, RETICCTPCT in the last 72 hours. ?Sepsis Labs: ?No results for input(s): PROCALCITON, LATICACIDVEN in the last 168 hours. ? ?Recent Results (from the past 240 hour(s))  ?Resp Panel by RT-PCR (Flu A&B, Covid) Nasopharyngeal Swab     Status: None  ? Collection Time: 07/08/21 10:46 PM  ? Specimen: Nasopharyngeal Swab; Nasopharyngeal(NP) swabs in vial transport medium  ?Result Value Ref Range Status  ? SARS Coronavirus 2 by RT PCR NEGATIVE NEGATIVE Final  ?  Comment: (NOTE) ?SARS-CoV-2 target nucleic acids are NOT DETECTED. ? ?The SARS-CoV-2 RNA is generally detectable in upper respiratory ?specimens during the acute phase of infection. The lowest ?concentration of SARS-CoV-2 viral copies this assay can detect is ?138 copies/mL. A negative result does not preclude SARS-Cov-2 ?infection and should not be used as the sole basis for treatment or ?other patient management decisions. A negative result may occur with  ?improper specimen collection/handling, submission of specimen other ?than nasopharyngeal  swab, presence of viral mutation(s) within the ?areas targeted by this assay, and inadequate number of viral ?copies(<138 copies/mL). A negative result must be combined with ?clinical observations, patient history, and epidemiological ?information. The expected result is Negative. ? ?Fact Sheet for Patients:  ?BloggerCourse.comhttps://www.fda.gov/media/152166/download ? ?Fact Sheet for Healthcare Providers:  ?SeriousBroker.ithttps://www.fda.gov/media/152162/download ? ?This test is no t yet approved or cleared by the [place] FDA and  ?has been authorized for detection and/or diagnosis of SARS-CoV-2 by ?FDA under an Emergency Use Authorization (EUA). This EUA will remain  ?in effect (meaning this test can be used) for the duration of the ?COVID-19 declaration under Section 564(b)(1) of the Act, 21 ?U.S.C.section 360bbb-3(b)(1), unless the authorization is terminated  ?or revoked sooner.  ? ? ?  ? Influenza A by PCR NEGATIVE NEGATIVE Final  ? Influenza B by PCR NEGATIVE NEGATIVE Final  ?  Comment: (NOTE) ?The Xpert Xpress SARS-CoV-2/FLU/RSV plus assay is intended as an aid ?in the diagnosis of influenza from Nasopharyngeal swab specimens and ?should not be used as a sole basis for treatment. Nasal washings and ?aspirates are  unacceptable for Xpert Xpress SARS-CoV-2/FLU/RSV ?testing. ? ?Fact Sheet for Patients: ?BloggerCourse.com ? ?Fact Sheet for Healthcare Providers: ?SeriousBroker.it ? ?This test is not yet approved or cleared by the [place] FDA and ?has been authorized for detection and/or diagnosis of SARS-CoV-2 by ?FDA under an Emergency Use Authorization (EUA). This EUA will remain ?in effect (meaning this test can be used) for the duration of the ?COVID-19 declaration under Section 564(b)(1) of the Act, 21 U.S.C. ?section 360bbb-3(b)(1), unless the authorization is terminated or ?revoked. ? ?Performed at [facility], [address] [place], ?[place] [address] ?  ?   ? ? ? ? ? ?Radiology Studies: ?CT ABDOMEN PELVIS WO CONTRAST ? ?Result Date: 07/08/2021 ?CLINICAL DATA:  Abdominal pain. EXAM: CT ABDOMEN AND PELVIS WITHOUT CONTRAST TECHNIQUE: Multidetector CT imaging of the ab

## 2021-07-11 LAB — GLUCOSE, CAPILLARY
Glucose-Capillary: 151 mg/dL — ABNORMAL HIGH (ref 70–99)
Glucose-Capillary: 165 mg/dL — ABNORMAL HIGH (ref 70–99)
Glucose-Capillary: 160 mg/dL — ABNORMAL HIGH (ref 70–99)
Glucose-Capillary: 141 mg/dL — ABNORMAL HIGH (ref 70–99)

## 2021-07-11 MED ORDER — OXYCODONE HCL 5 MG PO TABS
5.0000 mg | ORAL_TABLET | ORAL | Status: DC | PRN
  Administered 2021-07-11 (×2): 5 mg via ORAL
  Filled 2021-07-11 (×2): qty 1

## 2021-07-11 MED ORDER — GABAPENTIN 400 MG PO CAPS: 400.0000 mg | ORAL_CAPSULE | Freq: Three times a day (TID) | ORAL | Status: DC

## 2021-07-11 MED ORDER — GABAPENTIN 400 MG PO CAPS
400.0000 mg | ORAL_CAPSULE | Freq: Three times a day (TID) | ORAL | Status: DC
  Administered 2021-07-11 – 2021-07-12 (×2): 400 mg via ORAL
  Filled 2021-07-11 (×3): qty 1

## 2021-07-11 NOTE — TOC Progression Note (Signed)
Transition of Care (TOC) - Progression Note  ? ? ?Patient Details  ?Name: Steve Alvarado ?MRN: [id number] ?Date of Birth: 07-31-1955 ? ?Transition of Care (TOC) CM/SW Contact  ?Izacc Demeyer, LCSW ?Phone Number: [phone] ?07/11/2021, 10:49 AM ? ?Clinical Narrative:    ? ?Plan for patient to discharge to [facility]. SNF  Insurance authorization started on 07/09/2021, facility does not accept weekend admissions.  Estimated discharge date is 07/12/2021, pending insurance auth. ? ? ?Expected Discharge Plan: Skilled Nursing Facility ?Barriers to Discharge: Continued Medical Work up ? ?Expected Discharge Plan and Services ?Expected Discharge Plan: Skilled Nursing Facility ?  ?Discharge Planning Services: CM Consult ?Post Acute Care Choice: Skilled Nursing Facility ?Living arrangements for the past 2 months: Apartment ?                ?DME Arranged: N/A ?DME Agency: NA ?  ?  ?  ?HH Arranged: NA ?HH Agency: NA ?  ?  ?  ? ? ?Social Determinants of Health (SDOH) Interventions ?  ? ?Readmission Risk Interventions ?   ? View : No data to display.  ?  ?  ?  ? ? ?

## 2021-07-11 NOTE — Progress Notes (Signed)
Brief hospitalist update note.  Nonbillable note. ? ?Patient remains medically stable for discharge.  Plan to discharge to [facility].  Skilled nursing facility insurance authorization started 4/21.  Facility does not accept weekend admissions. ? ?We will plan to monitor overnight.  Discharge to [facility]/24 ? ?Lolita Patella MD ? ?No charge  ?

## 2021-07-11 NOTE — Plan of Care (Signed)

## 2021-07-12 LAB — GLUCOSE, CAPILLARY
Glucose-Capillary: 173 mg/dL — ABNORMAL HIGH (ref 70–99)
Glucose-Capillary: 212 mg/dL — ABNORMAL HIGH (ref 70–99)

## 2021-07-12 NOTE — Care Management Important Message (Signed)
Important Message ? ?Patient Details  ?Name: Steve Alvarado ?MRN: [id number] ?Date of Birth: May 17, 1955 ? ? ?Medicare Important Message Given:  Yes ? ? ? ? ?Johnell Comings ?07/12/2021, 11:57 AM ?

## 2021-07-12 NOTE — TOC Transition Note (Signed)
Transition of Care (TOC) - CM/SW Discharge Note ? ? ?Patient Details  ?Name: Steve Alvarado ?MRN: [id number] ?Date of Birth: May 17, 1955 ? ?Transition of Care (TOC) CM/SW Contact:  ?Chapman Fitch, RN ?Phone Number: ?07/12/2021, 9:47 AM ? ? ?Clinical Narrative:    ? ? ?Insurance auth received for SNF through [facility] portal ? ?Patient will DC to: [facility] ?Anticipated DC date: 07/12/21 ? ?Family notified: Patient to notify his ex wife  ?Transport [id number] ? ?Per MD patient ready for DC to . RN, patient,  and facility notified of DC. Discharge Summary sent to facility. RN given number for report. DC packet on chart. Ambulance transport requested for patient.  ?TOC signing off. ? ?Bevelyn Ngo RNCM ?[phone] ? ?  ?Barriers to Discharge: Continued Medical Work up ? ? ?Patient Goals and CMS Choice ?Patient states their goals for this hospitalization and ongoing recovery are:: Patient agrees to SNF for short term rehab ?CMS Medicare.gov Compare Post Acute Care list provided to:: Patient ?Choice offered to / list presented to : Patient ? ?Discharge Placement ?  ?           ?  ?  ?  ?  ? ?Discharge Plan and Services ?  ?Discharge Planning Services: CM Consult ?Post Acute Care Choice: Skilled Nursing Facility          ?DME Arranged: N/A ?DME Agency: NA ?  ?  ?  ?HH Arranged: NA ?HH Agency: NA ?  ?  ?  ? ?Social Determinants of Health (SDOH) Interventions ?  ? ? ?Readmission Risk Interventions ?   ? View : No data to display.  ?  ?  ?  ? ? ? ? ? ?

## 2021-07-12 NOTE — Progress Notes (Signed)
Report called to Patty at [facility]. Waiting on EMS to transport the patient. IV removed ?

## 2021-07-12 NOTE — Discharge Summary (Signed)
Physician Discharge Summary  ?Steve Alvarado UEA:[id number] DOB: 1955/11/07 DOA: 07/08/2021 ? ?PCP: Ellery PlunkBarnhouse, [name], MD ? ?Admit date: 07/08/2021 ?Discharge date: 07/12/2021 ? ?Admitted From: Home ?Disposition:  SNF ? ?Recommendations for Outpatient Follow-up:  ?Follow up with PCP in 1-2 weeks ? ? ?Home Health:No ?Equipment/Devices:None  ? ?Discharge Condition:Stable  ?CODE STATUS:FULL  ?Diet recommendation: Regular ? ?Brief/Interim Summary: ? ?66 year old male who came to the ED with chief complaint of altered mentation.  Also endorsed generalized weakness and fatigue with associated dizziness and lightheadedness.  Symptoms consistent with decreased p.o. intake and dehydration.  Patient does endorse body aches and headache.  Was started on intravenous fluids.  TOC engaged.  Home may be an unsafe living situation.  Will place in observation status, hydrate with normal saline, follow BMP, allow time for kidney function to resolve, remainder of care per H&P.  TOC, PT, OT, pastoral services all engaged. ?  ?Patient remained hemodynamically stable.  Kidney function improved.  Insurance authorization obtained.  [facility]  Unfortunately they do not accept patients over the weekend.  Plan to discharge Monday 4/24. ? ? ?Discharge Diagnoses:  ?Principal Problem: ?  AKI (acute kidney injury) (HCC) ?Active Problems: ?  Uncontrolled type 2 diabetes mellitus with hyperglycemia, with long-term current use of insulin (HCC) ?  CAD (coronary artery disease) ?  Essential hypertension ?  Hypothyroidism ? ?Acute kidney injury ?Suspect prerenal azotemia.  Has responded to IV fluid resuscitation ?Plan: ?No further IVF.  Avoid non essential nephrotoxins ?  ?Diabetes mellitus type 2 uncontrolled with hyperglycemia ?Resume home regimen ?  ?Hypothyroidism ?PTA Synthroid  ?  ?Essential hypertension ?PTA Coreg and Norvasc ?Restart ACE ?DC propranolol due to relative bradycardia ?  ?CAD (coronary artery  disease) ?PTA Coreg and Imdur ? ?Discharge Instructions ? ?Discharge Instructions   ? ? Diet - low sodium heart healthy   Complete by: As directed ?  ? Increase activity slowly   Complete by: As directed ?  ? ?  ? ?Allergies as of 07/12/2021   ? ?   Reactions  ? Ace Inhibitors Cough  ? cough  ? ?  ? ?  ?Medication List  ?  ? ?STOP taking these medications   ? ?propranolol 40 MG tablet ?Commonly known as: INDERAL ?  ? ?  ? ?TAKE these medications   ? ?amLODipine 5 MG tablet ?Commonly known as: NORVASC ?Take 5 mg by mouth daily. ?  ?atorvastatin 40 MG tablet ?Commonly known as: LIPITOR ?Take 1 tablet (40 mg total) by mouth every evening. ?  ?carvedilol 3.125 MG tablet ?Commonly known as: COREG ?Take 1 tablet (3.125 mg total) by mouth 2 (two) times daily with a meal. ?  ?citalopram 20 MG tablet ?Commonly known as: CELEXA ?Take 1 tablet (20 mg total) by mouth daily. ?  ?gabapentin 400 MG capsule ?Commonly known as: NEURONTIN ?Take 400 mg by mouth 3 (three) times daily. ?  ?HumaLOG 100 UNIT/ML injection ?Generic drug: insulin lispro ?Inject 0.03 mLs (3 Units total) into the skin 3 (three) times daily with meals. ?  ?isosorbide dinitrate 30 MG tablet ?Commonly known as: ISORDIL ?Take 30 mg by mouth daily. ?  ?Lantus 100 UNIT/ML injection ?Generic drug: insulin glargine ?Inject 0.3 mLs (30 Units total) into the skin 2 (two) times daily. ?  ?levothyroxine 75 MCG tablet ?Commonly known as: SYNTHROID ?Take 75 mcg by mouth daily. ?  ?lidocaine 5 % ?Commonly known as: Lidoderm ?Place 1 patch onto the skin daily.  Remove & Discard patch within 12 hours or as directed by MD ?  ?losartan 50 MG tablet ?Commonly known as: COZAAR ?Take 50 mg by mouth daily. ?  ?metFORMIN 1000 MG tablet ?Commonly known as: GLUCOPHAGE ?Take 1,000 mg by mouth 2 (two) times daily. ?  ?pantoprazole 40 MG tablet ?Commonly known as: PROTONIX ?Take 40 mg by mouth daily. ?  ? ?  ? ? Contact information for after-discharge care   ? ? Destination   ? ? [facility] .   ?Service: Skilled Nursing ?Contact information: ?[address] ?[place] [address] ?[phone] ? ?  ?  ? ?  ?  ? ?  ?  ? ?  ? ?Allergies  ?Allergen Reactions  ? Ace Inhibitors Cough  ?  cough  ? ? ?Consultations: ?None ? ? ?Procedures/Studies: ?CT ABDOMEN PELVIS WO CONTRAST ? ?Result Date: 07/08/2021 ?CLINICAL DATA:  Abdominal pain. EXAM: CT ABDOMEN AND PELVIS WITHOUT CONTRAST TECHNIQUE: Multidetector CT imaging of the abdomen and pelvis was performed following the standard protocol without IV contrast. RADIATION DOSE REDUCTION: This exam was performed according to the departmental dose-optimization program which includes automated exposure control, adjustment of the mA and/or kV according to patient size and/or use of iterative reconstruction technique. COMPARISON:  CT abdomen pelvis dated 06/13/2021. FINDINGS: Evaluation of this exam is limited in the absence of intravenous contrast. Lower chest: Minimal bibasilar dependent atelectasis. The visualized lung bases are otherwise clear. There is advanced 3 vessel coronary vascular calcification. No intra-abdominal free air or free fluid. Hepatobiliary: No focal liver abnormality is seen. No gallstones, gallbladder wall thickening, or biliary dilatation. Pancreas: Unremarkable. No pancreatic ductal dilatation or surrounding inflammatory changes. Spleen: Normal in size without focal abnormality. Adrenals/Urinary Tract: The adrenal glands unremarkable. The kidneys, visualized ureters, and urinary bladder appear unremarkable. Stomach/Bowel: There is no bowel obstruction or active inflammation. The appendix is normal. Vascular/Lymphatic: Mild aortoiliac atherosclerotic disease. The IVC is unremarkable. No portal venous gas. There is no adenopathy. Reproductive: The prostate and seminal vesicles are grossly unremarkable no pelvic mass. Other: Small fat containing right inguinal hernia.  The Musculoskeletal:  Degenerative changes of the spine. No acute osseous pathology. Old healed left posterior rib fractures. IMPRESSION: 1. No acute intra-abdominal or pelvic pathology. No bowel obstruction. Normal appendix. 2. Advanced 3 vessel coronary vascular calcification. 3. Aortic Atherosclerosis (ICD10-I70.0). Electronically Signed   By: Elgie Collard M.D.   On: 07/08/2021 23:41  ? ?CT Abdomen Pelvis Wo Contrast ? ?Result Date: 06/13/2021 ?CLINICAL DATA:  Nausea and vomiting EXAM: CT ABDOMEN AND PELVIS WITHOUT CONTRAST TECHNIQUE: Multidetector CT imaging of the abdomen and pelvis was performed following the standard protocol without IV contrast. RADIATION DOSE REDUCTION: This exam was performed according to the departmental dose-optimization program which includes automated exposure control, adjustment of the mA and/or kV according to patient size and/or use of iterative reconstruction technique. COMPARISON:  01/24/2021 FINDINGS: Lower chest: Hypoventilatory changes at the lung bases. No acute pleural or parenchymal lung disease. Diffuse coronary artery atherosclerosis. Hepatobiliary: Unremarkable unenhanced appearance of the liver and gallbladder. Pancreas: Unremarkable unenhanced appearance. Spleen: Unremarkable unenhanced appearance. Adrenals/Urinary Tract: No urinary tract calculi or obstructive uropathy within either kidney. The adrenals and bladder are unremarkable. Stomach/Bowel: No bowel obstruction or ileus. Normal appendix right lower quadrant. No bowel wall thickening or inflammatory change. Vascular/Lymphatic: Aortic atherosclerosis. No enlarged abdominal or pelvic lymph nodes. Reproductive: Prostate is unremarkable. Other: No free fluid or free gas. Small fat containing right inguinal hernia.  Musculoskeletal: No acute or destructive bony lesions. Reconstructed images demonstrate no additional findings. IMPRESSION: 1. No acute intra-abdominal or intrapelvic process. 2. Small fat containing right inguinal hernia. 3.  Aortic Atherosclerosis (ICD10-I70.0). Coronary artery atherosclerosis. Electronically Signed   By: Sharlet Salina M.D.   On: 06/13/2021 21:30  ? ?DG Chest 2 View ? ?Result Date: 07/08/2021 ?CLINICAL DATA:  Weakness,

## 2021-08-12 ENCOUNTER — Encounter (HOSPITAL_COMMUNITY): Payer: Self-pay | Admitting: Neurology

## 2021-08-12 ENCOUNTER — Other Ambulatory Visit: Payer: Self-pay

## 2021-08-12 ENCOUNTER — Emergency Department (HOSPITAL_COMMUNITY): Payer: Medicare Other

## 2021-08-12 ENCOUNTER — Inpatient Hospital Stay (HOSPITAL_COMMUNITY)
Admission: EM | Admit: 2021-08-12 | Discharge: 2021-08-18 | DRG: 062 | Disposition: A | Discharge status: Skilled Nursing Facility | Payer: Medicare Other | Source: Skilled Nursing Facility | Attending: Neurology | Admitting: Neurology

## 2021-08-12 ENCOUNTER — Inpatient Hospital Stay (HOSPITAL_COMMUNITY): Payer: Medicare Other

## 2021-08-12 DIAGNOSIS — F1411 Cocaine abuse, in remission: Secondary | ICD-10-CM | POA: Diagnosis not present

## 2021-08-12 DIAGNOSIS — R29810 Facial weakness: Secondary | ICD-10-CM | POA: Diagnosis present

## 2021-08-12 DIAGNOSIS — R4781 Slurred speech: Secondary | ICD-10-CM | POA: Diagnosis present

## 2021-08-12 DIAGNOSIS — Z794 Long term (current) use of insulin: Secondary | ICD-10-CM | POA: Diagnosis not present

## 2021-08-12 DIAGNOSIS — E1165 Type 2 diabetes mellitus with hyperglycemia: Secondary | ICD-10-CM | POA: Diagnosis present

## 2021-08-12 DIAGNOSIS — I2510 Atherosclerotic heart disease of native coronary artery without angina pectoris: Secondary | ICD-10-CM | POA: Diagnosis present

## 2021-08-12 DIAGNOSIS — I10 Essential (primary) hypertension: Secondary | ICD-10-CM | POA: Diagnosis present

## 2021-08-12 DIAGNOSIS — I639 Cerebral infarction, unspecified: Secondary | ICD-10-CM | POA: Diagnosis not present

## 2021-08-12 DIAGNOSIS — R29708 NIHSS score 8: Secondary | ICD-10-CM | POA: Diagnosis present

## 2021-08-12 DIAGNOSIS — G8194 Hemiplegia, unspecified affecting left nondominant side: Secondary | ICD-10-CM | POA: Diagnosis present

## 2021-08-12 DIAGNOSIS — I6389 Other cerebral infarction: Secondary | ICD-10-CM | POA: Diagnosis not present

## 2021-08-12 DIAGNOSIS — E785 Hyperlipidemia, unspecified: Secondary | ICD-10-CM | POA: Diagnosis present

## 2021-08-12 DIAGNOSIS — I63311 Cerebral infarction due to thrombosis of right middle cerebral artery: Secondary | ICD-10-CM

## 2021-08-12 DIAGNOSIS — R4701 Aphasia: Secondary | ICD-10-CM

## 2021-08-12 DIAGNOSIS — E7800 Pure hypercholesterolemia, unspecified: Secondary | ICD-10-CM | POA: Diagnosis not present

## 2021-08-12 DIAGNOSIS — Z955 Presence of coronary angioplasty implant and graft: Secondary | ICD-10-CM | POA: Diagnosis not present

## 2021-08-12 DIAGNOSIS — E039 Hypothyroidism, unspecified: Secondary | ICD-10-CM | POA: Diagnosis present

## 2021-08-12 DIAGNOSIS — I6381 Other cerebral infarction due to occlusion or stenosis of small artery: Secondary | ICD-10-CM | POA: Diagnosis present

## 2021-08-12 DIAGNOSIS — Z7984 Long term (current) use of oral hypoglycemic drugs: Secondary | ICD-10-CM | POA: Diagnosis not present

## 2021-08-12 DIAGNOSIS — E11649 Type 2 diabetes mellitus with hypoglycemia without coma: Secondary | ICD-10-CM | POA: Diagnosis not present

## 2021-08-12 DIAGNOSIS — Z79890 Hormone replacement therapy (postmenopausal): Secondary | ICD-10-CM

## 2021-08-12 DIAGNOSIS — Z8249 Family history of ischemic heart disease and other diseases of the circulatory system: Secondary | ICD-10-CM | POA: Diagnosis not present

## 2021-08-12 DIAGNOSIS — R471 Dysarthria and anarthria: Secondary | ICD-10-CM | POA: Diagnosis present

## 2021-08-12 DIAGNOSIS — I63511 Cerebral infarction due to unspecified occlusion or stenosis of right middle cerebral artery: Principal | ICD-10-CM | POA: Diagnosis present

## 2021-08-12 DIAGNOSIS — I63421 Cerebral infarction due to embolism of right anterior cerebral artery: Secondary | ICD-10-CM

## 2021-08-12 DIAGNOSIS — K219 Gastro-esophageal reflux disease without esophagitis: Secondary | ICD-10-CM | POA: Diagnosis present

## 2021-08-12 LAB — PHOSPHORUS: Phosphorus: 3.3 mg/dL (ref 2.5–4.6)

## 2021-08-12 LAB — ECHOCARDIOGRAM COMPLETE
AR max vel: 3.13 cm2
AV Peak grad: 6.4 mmHg
Ao pk vel: 1.26 m/s
Area-P 1/2: 2.62 cm2
Height: 74 in
S' Lateral: 3.1 cm
Weight: 3343.94 oz

## 2021-08-12 LAB — COMPREHENSIVE METABOLIC PANEL
ALT: 25 U/L (ref 0–44)
AST: 25 U/L (ref 15–41)
Albumin: 3.5 g/dL (ref 3.5–5.0)
Alkaline Phosphatase: 71 U/L (ref 38–126)
Anion gap: 7 (ref 5–15)
BUN: 33 mg/dL — ABNORMAL HIGH (ref 8–23)
CO2: 19 mmol/L — ABNORMAL LOW (ref 22–32)
Calcium: 8.9 mg/dL (ref 8.9–10.3)
Chloride: 112 mmol/L — ABNORMAL HIGH (ref 98–111)
Creatinine, Ser: 1.59 mg/dL — ABNORMAL HIGH (ref 0.61–1.24)
GFR, Estimated: 47 mL/min — ABNORMAL LOW (ref >60)
Glucose, Bld: 164 mg/dL — ABNORMAL HIGH (ref 70–99)
Potassium: 5 mmol/L (ref 3.5–5.1)
Sodium: 138 mmol/L (ref 135–145)
Total Bilirubin: 0.2 mg/dL — ABNORMAL LOW (ref 0.3–1.2)
Total Protein: 6.6 g/dL (ref 6.5–8.1)

## 2021-08-12 LAB — CBC
HCT: 32.3 % — ABNORMAL LOW (ref 39.0–52.0)
Hemoglobin: 10.9 g/dL — ABNORMAL LOW (ref 13.0–17.0)
MCH: 30.4 pg (ref 26.0–34.0)
MCHC: 33.7 g/dL (ref 30.0–36.0)
MCV: 90 fL (ref 80.0–100.0)
Platelets: 218 10*3/uL (ref 150–400)
RBC: 3.59 MIL/uL — ABNORMAL LOW (ref 4.22–5.81)
RDW: 12.1 % (ref 11.5–15.5)
WBC: 5.3 10*3/uL (ref 4.0–10.5)
nRBC: 0 % (ref 0.0–0.2)

## 2021-08-12 LAB — DIFFERENTIAL
Abs Immature Granulocytes: 0.01 10*3/uL (ref 0.00–0.07)
Basophils Absolute: 0 10*3/uL (ref 0.0–0.1)
Basophils Relative: 1 %
Eosinophils Absolute: 0.5 10*3/uL (ref 0.0–0.5)
Eosinophils Relative: 10 %
Immature Granulocytes: 0 %
Lymphocytes Relative: 34 %
Lymphs Abs: 1.8 10*3/uL (ref 0.7–4.0)
Monocytes Absolute: 0.5 10*3/uL (ref 0.1–1.0)
Monocytes Relative: 9 %
Neutro Abs: 2.5 10*3/uL (ref 1.7–7.7)
Neutrophils Relative %: 46 %

## 2021-08-12 LAB — GLUCOSE, CAPILLARY
Glucose-Capillary: 164 mg/dL — ABNORMAL HIGH (ref 70–99)
Glucose-Capillary: 179 mg/dL — ABNORMAL HIGH (ref 70–99)

## 2021-08-12 LAB — MAGNESIUM: Magnesium: 1.6 mg/dL — ABNORMAL LOW (ref 1.7–2.4)

## 2021-08-12 LAB — ABO/RH: ABO/RH(D): A NEG

## 2021-08-12 LAB — CBG MONITORING, ED: Glucose-Capillary: 158 mg/dL — ABNORMAL HIGH (ref 70–99)

## 2021-08-12 LAB — I-STAT CHEM 8, ED
BUN: 33 mg/dL — ABNORMAL HIGH (ref 8–23)
Calcium, Ion: 1.03 mmol/L — ABNORMAL LOW (ref 1.15–1.40)
Chloride: 111 mmol/L (ref 98–111)
Creatinine, Ser: 1.7 mg/dL — ABNORMAL HIGH (ref 0.61–1.24)
Glucose, Bld: 164 mg/dL — ABNORMAL HIGH (ref 70–99)
HCT: 30 % — ABNORMAL LOW (ref 39.0–52.0)
Hemoglobin: 10.2 g/dL — ABNORMAL LOW (ref 13.0–17.0)
Potassium: 4.8 mmol/L (ref 3.5–5.1)
Sodium: 138 mmol/L (ref 135–145)
TCO2: 20 mmol/L — ABNORMAL LOW (ref 22–32)

## 2021-08-12 LAB — TYPE AND SCREEN
ABO/RH(D): A NEG
Antibody Screen: NEGATIVE

## 2021-08-12 LAB — MRSA NEXT GEN BY PCR, NASAL: MRSA by PCR Next Gen: NOT DETECTED

## 2021-08-12 LAB — APTT: aPTT: 29 seconds (ref 24–36)

## 2021-08-12 LAB — PROTIME-INR
INR: 1 (ref 0.8–1.2)
Prothrombin Time: 12.9 seconds (ref 11.4–15.2)

## 2021-08-12 LAB — HIV ANTIBODY (ROUTINE TESTING W REFLEX): HIV Screen 4th Generation wRfx: NONREACTIVE

## 2021-08-12 IMAGING — CT CT ANGIO HEAD-NECK (W OR W/O PERF)
2 of 7 series · 8 of 33 positions shown · IV contrast (OMNI 350)
Comparison: CT head [DATE]

CLINICAL DATA: Acute neuro deficit.  Rule out stroke

EXAM:
CT ANGIOGRAPHY HEAD AND NECK
TECHNIQUE: Multidetector CT imaging of the head and neck was performed using
the standard protocol during bolus administration of intravenous
contrast. Multiplanar CT image reconstructions and MIPs were
obtained to evaluate the vascular anatomy. Carotid stenosis
measurements (when applicable) are obtained utilizing NASCET
criteria, using the distal internal carotid diameter as the
denominator.

[Series 5: cta neck · axial · 0.52mm/px · z∈[+197,+313]mm · 2 of 176 slices shown]
[im 59/176  soft-tissue]
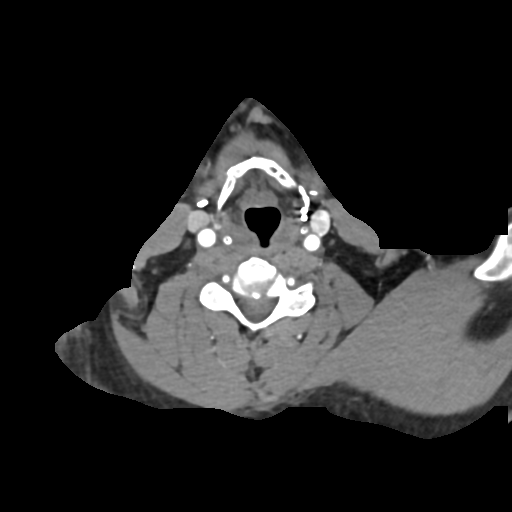
[im 117/176  soft-tissue]
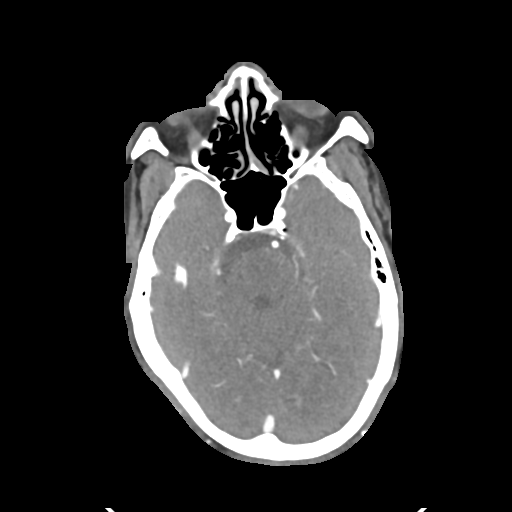

[Series 7: cta neck axial · axial · 0.49mm/px · z∈[+130,+381]mm · 6 of 353 slices shown]
[im 51/353  soft-tissue]
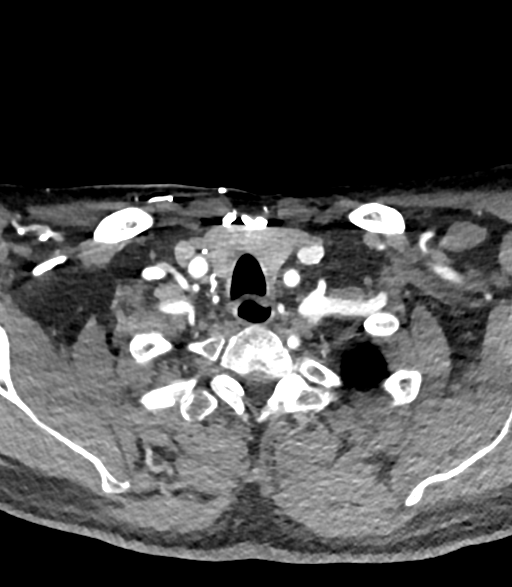
[im 101/353  bone]
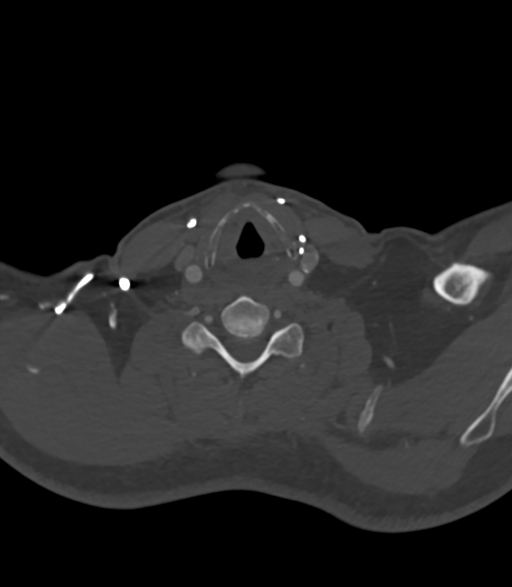
[im 151/353  soft-tissue]
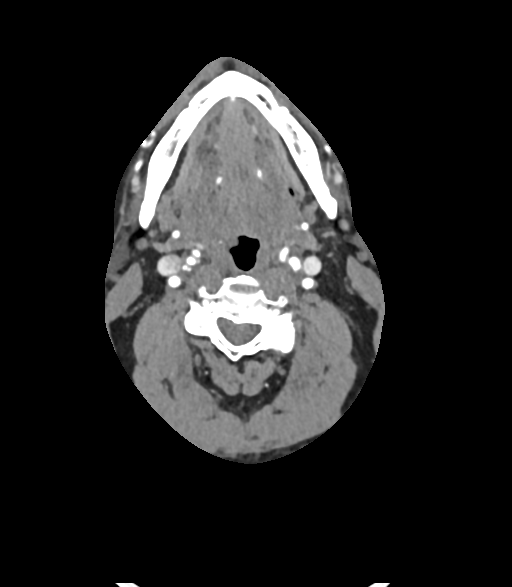
[im 202/353  bone]
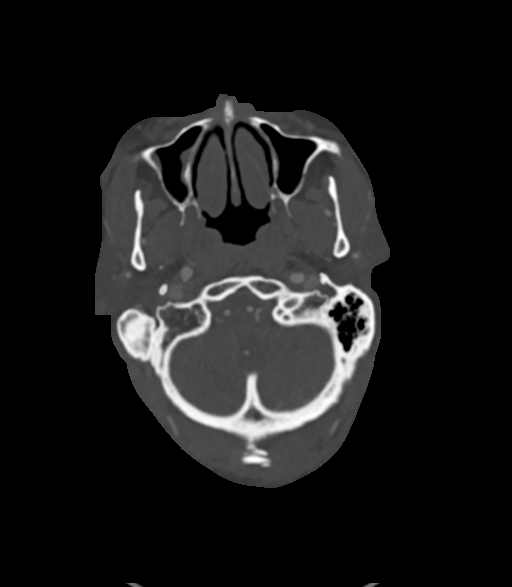
[im 252/353  soft-tissue]
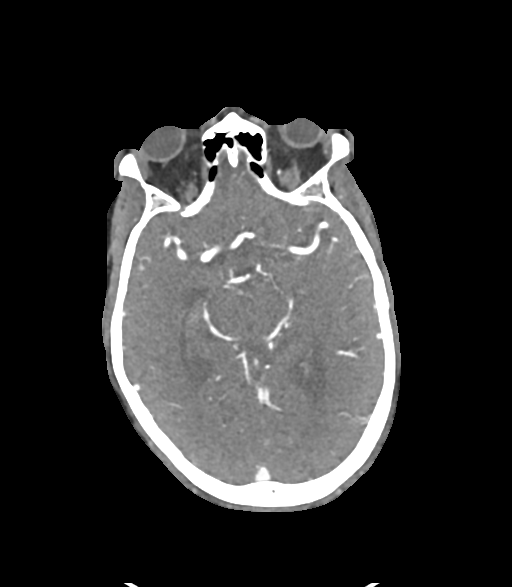
[im 302/353  bone]
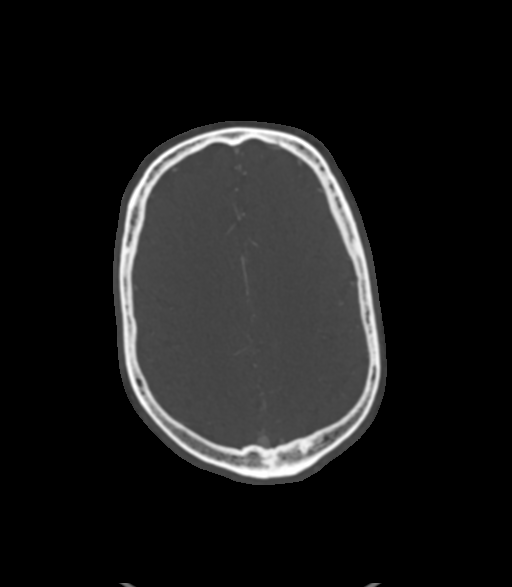

[8 of 33 positions shown; findings below may reference images not displayed]

RADIATION DOSE REDUCTION: This exam was performed according to the
departmental dose-optimization program which includes automated
exposure control, adjustment of the mA and/or kV according to
patient size and/or use of iterative reconstruction technique.

CONTRAST:  80mL OMNIPAQUE IOHEXOL 350 MG/ML SOLN
FINDINGS: CTA NECK FINDINGS

Aortic arch: Limited evaluation of the arch. Atherosclerotic
calcification in the aortic arch. Proximal great vessels patent.

Right carotid system: Mild atherosclerotic calcification right
carotid bulb. Negative for right carotid stenosis.

Left carotid system: Mild atherosclerotic calcification left carotid
bulb. Negative for stenosis

Vertebral arteries: Both vertebral arteries are patent to the skull
base without stenosis. Atherosclerotic disease in the both vertebral
arteries at the foramen magnum.

Skeleton: Negative

Other neck: Negative

Upper chest: Lung apices clear bilaterally.

Review of the MIP images confirms the above findings

CTA HEAD FINDINGS

Anterior circulation: Atherosclerotic calcification throughout the
cavernous carotid bilaterally without significant stenosis. Anterior
and middle cerebral arteries patent bilaterally. No stenosis or
large vessel occlusion. Negative for aneurysm.

Posterior circulation: Atherosclerotic calcification of the distal
vertebral artery bilaterally at the skull base. Moderate stenosis on
the right and mild stenosis on the left. Left PICA patent. Right
PICA not visualized. Right AICA patent. Basilar patent without
stenosis. Superior cerebellar and posterior cerebral arteries patent
bilaterally. Mild stenosis proximal posterior cerebral artery
bilaterally. Fetal origin of the left posterior cerebral artery.
Right posterior communicating artery patent.

Venous sinuses: Normal venous enhancement

Anatomic variants: None

Review of the MIP images confirms the above findings
IMPRESSION: 1. Negative for intracranial large vessel occlusion or significant
stenosis
2. Mild atherosclerotic disease in the carotid bifurcation
bilaterally without stenosis
3. Moderate stenosis distal right vertebral artery mild stenosis
distal left vertebral artery.

## 2021-08-12 IMAGING — CT CT HEAD CODE STROKE
4 series · 15 of 47 positions shown, 17 images · non-contrast
Comparison: [DATE]

CLINICAL DATA: Code stroke. Neuro deficit, acute, stroke suspected.



[Series 3: head 5.0 ax st · axial · 0.46mm/px · z∈[+299,+404]mm · 6 of 31 slices shown, 8 images]
[im 5/31  brain]
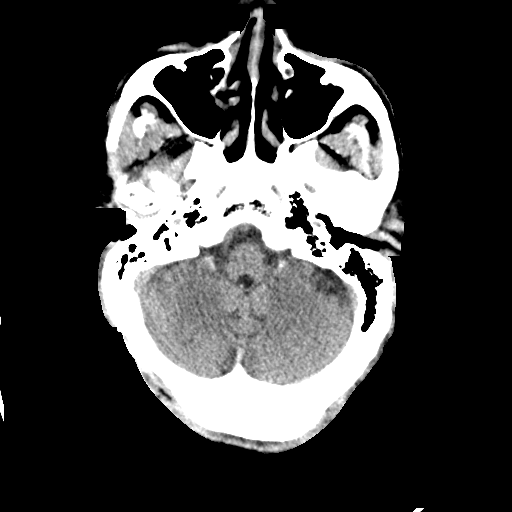
[im 5/31  bone]
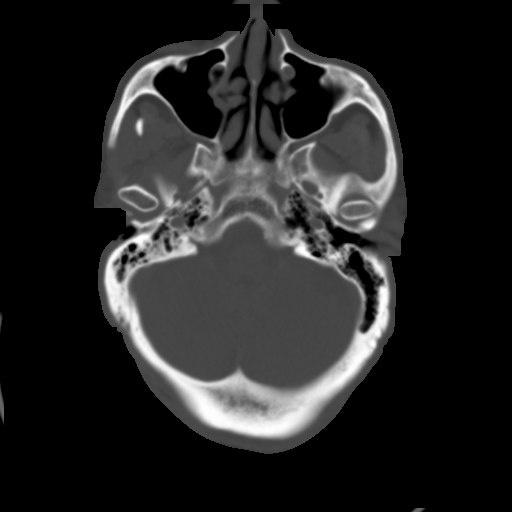
[im 9/31  brain]
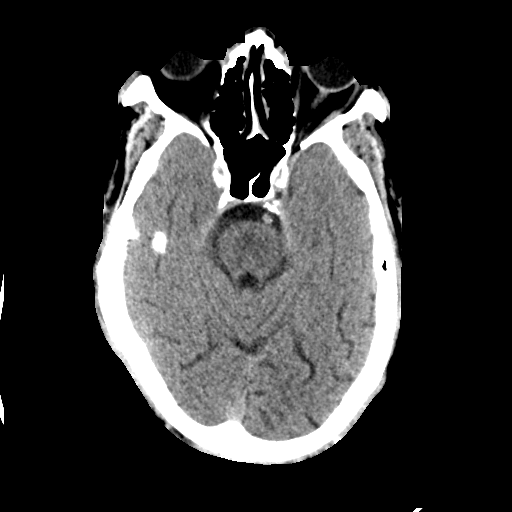
[im 13/31  brain]
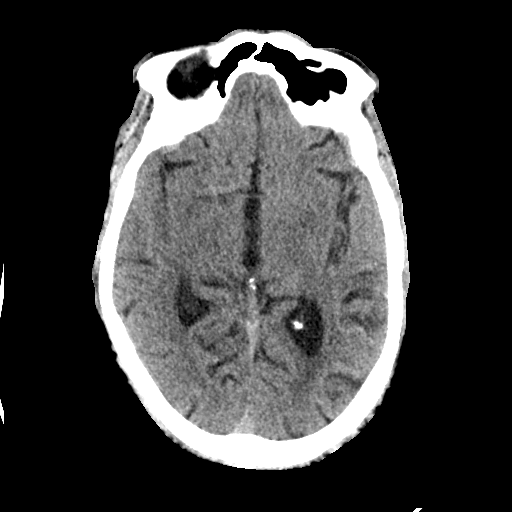
[im 18/31  brain]
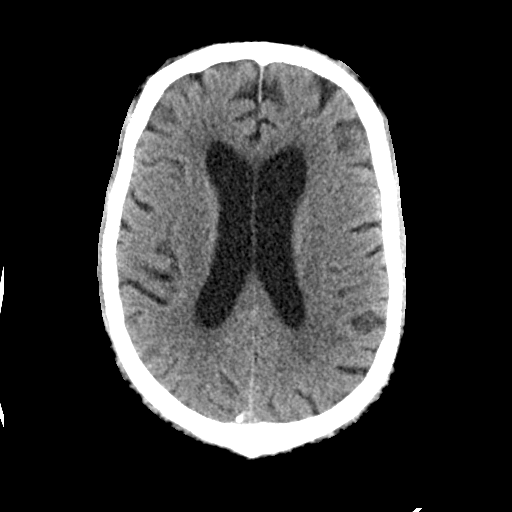
[im 22/31  brain]
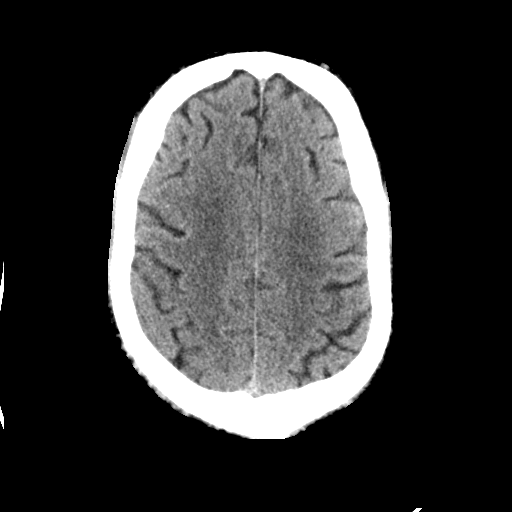
[im 22/31  bone]
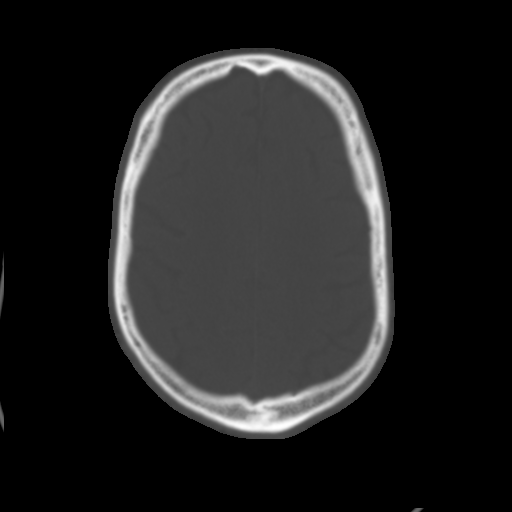
[im 26/31  brain]
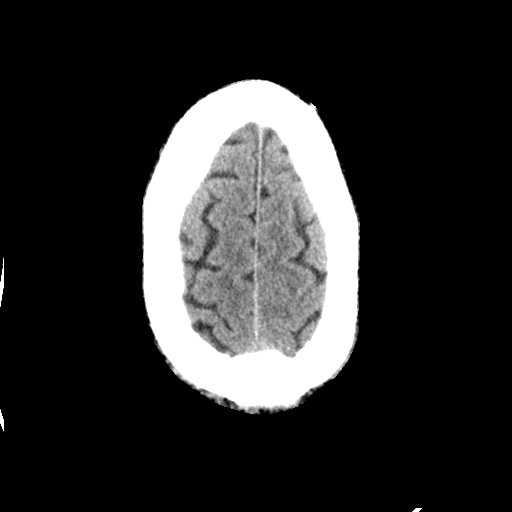

[Series 4: head 2.0 bone · axial · 0.46mm/px · z∈[+278,+318]mm · 3 of 84 slices shown]
[im 8/84  bone]
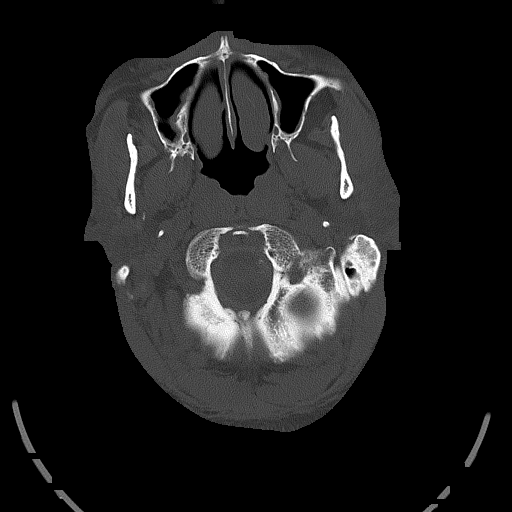
[im 16/84  bone]
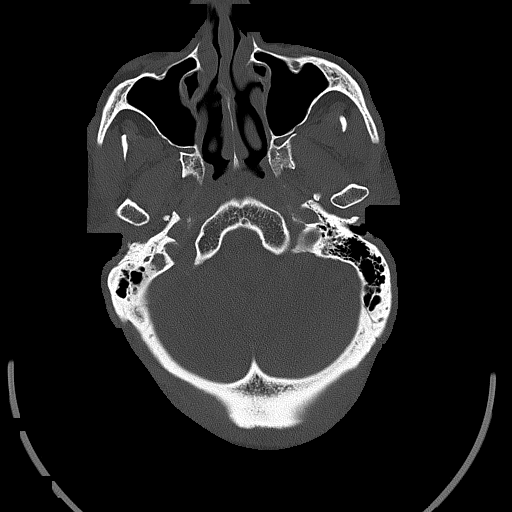
[im 28/84  bone]
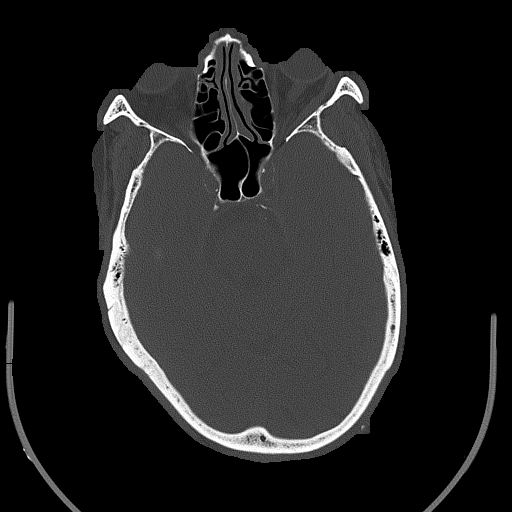

[Series 5: head 3.0 cor st · coronal · 0.34mm/px · 3 of 76 slices shown]
[im 26/76  brain]
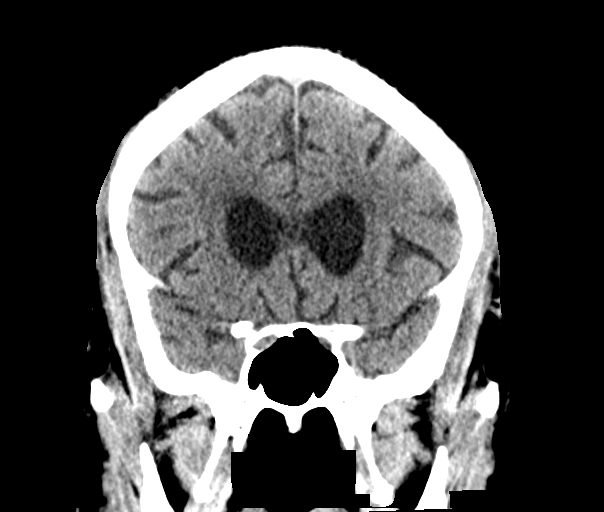
[im 34/76  brain]
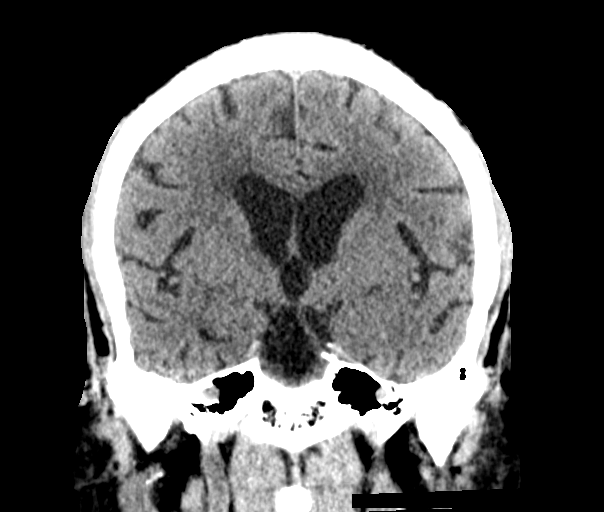
[im 42/76  brain]
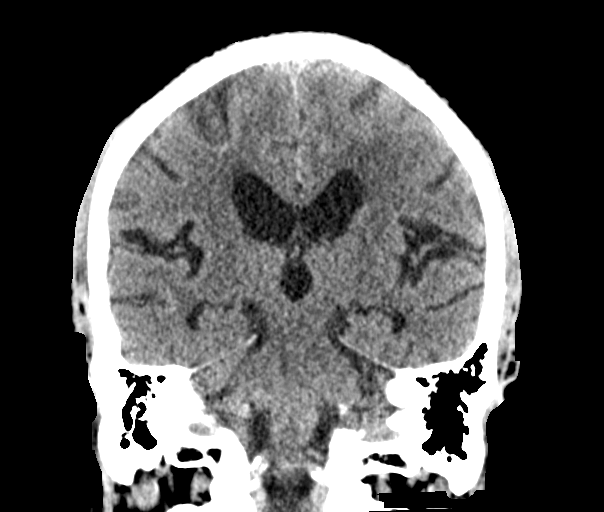

[Series 6: head 3.0 sag st · sagittal · 0.34mm/px · 3 of 67 slices shown]
[im 23/67  brain]
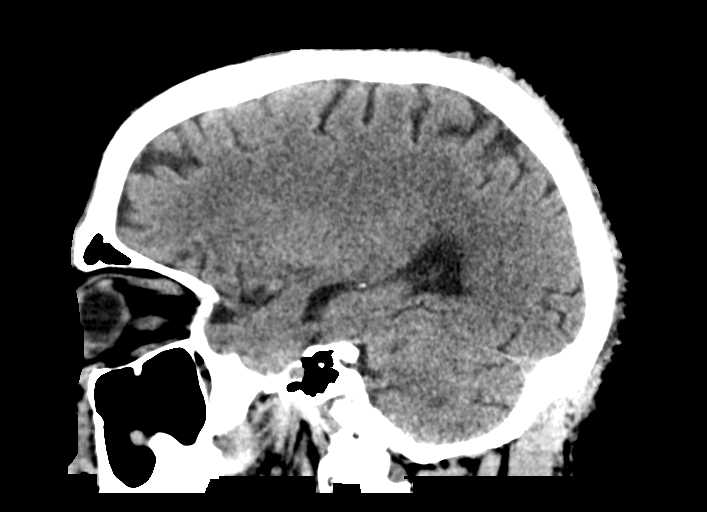
[im 34/67  brain]
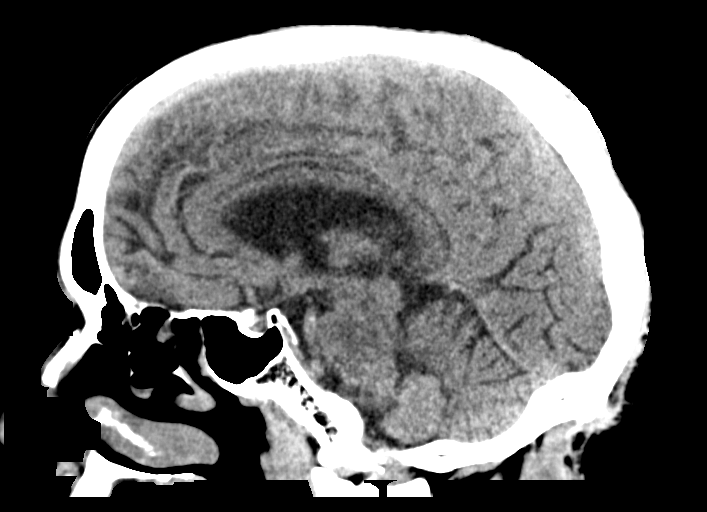
[im 45/67  brain]
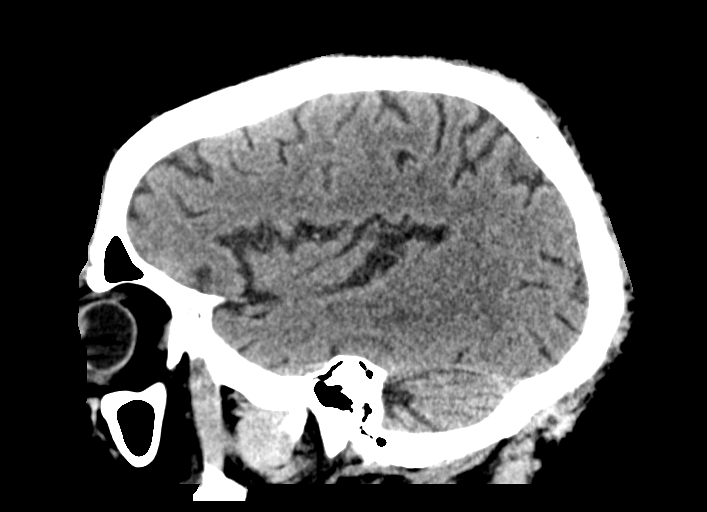

[15 of 47 positions shown; findings below may reference images not displayed]

FINDINGS: Brain: Mild age related volume loss. Mild chronic small-vessel
ischemic change of the white matter. No sign of acute infarction,
mass lesion, hemorrhage, hydrocephalus or extra-axial collection.

Vascular: There is atherosclerotic calcification of the major
vessels at the base of the brain.

Skull: Normal

Sinuses/Orbits: Mild mucosal thickening. No advanced disease. Orbits
negative.

Other: None

ASPECTS (Alberta Stroke Program Early CT Score)

- Ganglionic level infarction (caudate, lentiform nuclei, internal
capsule, insula, M1-M3 cortex): 7

- Supraganglionic infarction (M4-M6 cortex): 3

Total score (0-10 with 10 being normal): 10
IMPRESSION: 1. No acute CT finding. Mild age related volume loss and
small-vessel change of the white matter.
2. ASPECTS is 10.
3. These results were communicated to Dr. TAUKOBONG at [DATE] on
[DATE] by text page via the [employer] messaging system.

## 2021-08-12 MED ORDER — STROKE: EARLY STAGES OF RECOVERY BOOK
Freq: Once | Status: AC
  Filled 2021-08-12: qty 1

## 2021-08-12 MED ORDER — ACETAMINOPHEN 160 MG/5ML PO SOLN: 650.0000 mg | ORAL | Status: DC | PRN

## 2021-08-12 MED ORDER — PANTOPRAZOLE SODIUM 40 MG IV SOLR
40.0000 mg | Freq: Every day | INTRAVENOUS | Status: DC
  Administered 2021-08-12 – 2021-08-13 (×2): 40 mg via INTRAVENOUS
  Filled 2021-08-12 (×2): qty 10

## 2021-08-12 MED ORDER — SENNOSIDES-DOCUSATE SODIUM 8.6-50 MG PO TABS
1.0000 | ORAL_TABLET | Freq: Every evening | ORAL | Status: DC | PRN
  Administered 2021-08-16: 1 via ORAL
  Filled 2021-08-12: qty 1

## 2021-08-12 MED ORDER — SODIUM CHLORIDE 0.9% FLUSH: 3.0000 mL | Freq: Once | INTRAVENOUS | Status: DC

## 2021-08-12 MED ORDER — IOHEXOL 350 MG/ML SOLN
80.0000 mL | Freq: Once | INTRAVENOUS | Status: AC | PRN
  Administered 2021-08-12: 80 mL via INTRAVENOUS

## 2021-08-12 MED ORDER — INSULIN ASPART 100 UNIT/ML IJ SOLN
0.0000 [IU] | Freq: Three times a day (TID) | INTRAMUSCULAR | Status: DC
  Administered 2021-08-12: 3 [IU] via SUBCUTANEOUS
  Administered 2021-08-13 (×3): 2 [IU] via SUBCUTANEOUS
  Administered 2021-08-14: 3 [IU] via SUBCUTANEOUS
  Administered 2021-08-14: 2 [IU] via SUBCUTANEOUS
  Administered 2021-08-14: 5 [IU] via SUBCUTANEOUS
  Administered 2021-08-15 (×2): 2 [IU] via SUBCUTANEOUS
  Administered 2021-08-16: 1 [IU] via SUBCUTANEOUS
  Administered 2021-08-17: 2 [IU] via SUBCUTANEOUS

## 2021-08-12 MED ORDER — ACETAMINOPHEN 650 MG RE SUPP: 650.0000 mg | RECTAL | Status: DC | PRN

## 2021-08-12 MED ORDER — CHLORHEXIDINE GLUCONATE CLOTH 2 % EX PADS
6.0000 | MEDICATED_PAD | Freq: Every day | CUTANEOUS | Status: DC
  Administered 2021-08-12 – 2021-08-16 (×5): 6 via TOPICAL

## 2021-08-12 MED ORDER — INSULIN GLARGINE-YFGN 100 UNIT/ML ~~LOC~~ SOLN
15.0000 [IU] | Freq: Two times a day (BID) | SUBCUTANEOUS | Status: DC
  Administered 2021-08-12 – 2021-08-16 (×9): 15 [IU] via SUBCUTANEOUS
  Filled 2021-08-12 (×12): qty 0.15

## 2021-08-12 MED ORDER — ACETAMINOPHEN 325 MG PO TABS
650.0000 mg | ORAL_TABLET | ORAL | Status: DC | PRN
  Administered 2021-08-12 – 2021-08-16 (×4): 650 mg via ORAL
  Filled 2021-08-12 (×4): qty 2

## 2021-08-12 MED ORDER — LABETALOL HCL 5 MG/ML IV SOLN
10.0000 mg | INTRAVENOUS | Status: DC | PRN
  Filled 2021-08-12: qty 4

## 2021-08-12 MED ORDER — CLEVIDIPINE BUTYRATE 0.5 MG/ML IV EMUL: 0.0000 mg/h | INTRAVENOUS | Status: DC

## 2021-08-12 MED ORDER — SODIUM CHLORIDE 0.9 % IV SOLN: INTRAVENOUS | Status: DC

## 2021-08-12 MED ORDER — TENECTEPLASE FOR STROKE
0.2500 mg/kg | PACK | Freq: Once | INTRAVENOUS | Status: AC
  Administered 2021-08-12: 24 mg via INTRAVENOUS
  Filled 2021-08-12: qty 10

## 2021-08-12 NOTE — H&P (Addendum)
Neurology H&P  CC: Left side weakness and facial droop  History is obtained from:patient  HPI: Steve Alvarado is a 66 y.o. male with past medical history of CAD, HTN, HLD, DM and thyroid disease who presents from a facility via EMS for sudden onset of left side weakness and facial droop. Code stroke called by EMS. LKW 1100. Per patient he awoke at 0730 in normal state of health, he went to take a shower and became dizzy. Staff found him in down  in the shower. NIHSS 8. CTH no acute process. CTA no LVO. Patient was deemed appropriate for TNK. TNK given @ 1204. Patient will be admitted to the Neuro ICU    LKW: 1100 tpa given?: yes @1204  Modified Rankin Scale: 4-Needs assistance to walk and tend to bodily needs  ROS: A 14 point ROS was performed and is negative except as noted in the HPI. status.   Past Medical History:  Diagnosis Date   Coronary artery disease    Diabetes mellitus    Type II   Hyperlipidemia    Hypertension    Thyroid disease      Family History  Problem Relation Age of Onset   Heart attack Mother      Social History:  reports that he has never smoked. He has never used smokeless tobacco. He reports current drug use. He reports that he does not drink alcohol.   Exam: Current vital signs: Ht 6\' 2"  (1.88 m)   Wt 94.8 kg   BMI 26.83 kg/m  Vital signs in last 24 hours: Weight:  [94.8 kg] 94.8 kg (05/25 1100)  Physical Exam  Constitutional: Appears well-developed and well-nourished.  Psych: Affect appropriate to situation Eyes: No scleral injection HENT: No OP obstrucion Head: Normocephalic.  Cardiovascular: Normal rate and regular rhythm.  Respiratory: Effort normal and breath sounds normal to anterior ascultation GI: Soft.  No distension. There is no tenderness.  Skin: WDI  Neuro: Mental Status: Patient is awake, alert, oriented to person, place, month, year, and situation. Patient is able to give a clear and coherent history. Has dysarthria   No signs of aphasia or neglect Cranial Nerves: II: Visual Fields are full. Pupils are equal, round, and reactive to light.   III,IV, VI: EOMI without ptosis or diploplia.  V: Facial sensation is symmetric to temperature VII: left lower face weakness and mild left eye closure weakness.  VIII: hearing is intact to voice X: Uvula elevates symmetrically XI: Shoulder shrug is symmetric. XII: tongue is midline without atrophy or fasciculations.  Motor: Tone is normal. Bulk is normal. 5/5 on right upper and lower. No movement (0/5) to noxious stimuli on left upper or lower  Sensory: Sensation is symmetric to light touch and temperature in the arms and legs. Cerebellar: FNF and HKS are intact bilaterally   I have reviewed labs in epic and the results pertinent to this consultation are:   I have reviewed the images obtained: Code stroke [facility] 5/25: 1. No acute CT finding. Mild age related volume loss and small-vessel change of the white matter. 2. ASPECTS is 10.  Code sTroke CTA Head/neck  1. Negative for intracranial large vessel occlusion or significant stenosis 2. Mild atherosclerotic disease in the carotid bifurcation bilaterally without stenosis 3. Moderate stenosis distal right vertebral artery mild stenosis distal left vertebral artery.   Primary Diagnosis:  Acute Right MCA ischemic infarct   Secondary Diagnosis: Essential (primary) hypertension and Type 2 diabetes mellitus with hyperglycemia  Hypothtyroidism GERD  Recommendations: - admit to Neuro ICU  - SBP goal post TNK <180/105 - Cleviprex gtt ordered for SBP goal  - Labetalol 10mg  IV PRN to maintain SBP - HgbA1c, fasting lipid panel - 24 hr CT head post TNK  - MRI of the brain without contrast - bedside swallow screen, if passes will start diet. If fails may need NG tube - Frequent neuro checks - Echocardiogram - Hold all Antiplatelet meds for 24hrs after TNK. If no hemorrhage on CT scan will start: Aspirin - dose  81mg   - Risk factor modification - Telemetry monitoring - PT consult, OT consult, Speech consult - Stroke team to follow   Beulah Gandy DNP, ACNPC-AG

## 2021-08-12 NOTE — TOC CAGE-AID Note (Signed)
Transition of Care [facility]) - CAGE-AID Screening   Patient Details  Name: Steve Alvarado MRN: [id number] Date of Birth: [birth year]  Transition of Care [facility]) CM/SW Contact:    Goran Olden C Tarpley-Carter, [place] Phone Number: 08/12/2021, 3:13 PM   Clinical Narrative: Pt is unable to participate in Cage Aid. CSW will assess at a better time.  Evaan Tidwell Tarpley-Carter, MSW, LCSW-A Pronouns:  She/Her/Hers [facility]itions of Care Clinical Social Worker Direct Number:  [phone] [email or web address]  CAGE-AID Screening: Substance Abuse Screening unable to be completed due to: : Patient unable to participate

## 2021-08-12 NOTE — Code Documentation (Signed)
Stroke Response Nurse Documentation Code Documentation  Steve Alvarado is a 66 y.o. male arriving to [facility]  via [facility] on 08/12/21 with past medical hx of CAD, HLD, HTN, DM. On No antithrombotic. Code stroke was activated by EMS.   Patient from assisted living facility where he was LKW at 1100 and now complaining of left sided weakness and slurred speech.   Stroke team at the bedside on patient arrival. Labs drawn and patient cleared for CT by Dr. Tomi Bamberger. Patient to CT with team. NIHSS 8, see documentation for details and code stroke times. Patient with right facial droop, left arm weakness, left leg weakness, and left decreased sensation on exam. The following imaging was completed:  CT Head and CTA. Patient is a candidate for IV Thrombolytic and received at 1204. Patient is not a candidate for IR due to no suspected LVO.   Care Plan: post-TNK NIHSS and VS.   Bedside handoff with ED RN.    Meda Klinefelter  Stroke Response RN

## 2021-08-12 NOTE — Progress Notes (Signed)
PHARMACIST CODE STROKE RESPONSE  Notified to mix TNK at 1200 by Dr. Thomasena Edis Delivered TNK to RN at 1203  TNK dose = 24 mg IV over 5 seconds at 12:04 PM  Issues/delays encountered (if applicable): N/A  Steve Alvarado 08/12/21 11:57 AM

## 2021-08-12 NOTE — ED Provider Notes (Signed)
[facility] EMERGENCY DEPARTMENT Provider Note   CSN: [id number] Arrival date & time: 08/12/21  1148  An emergency department physician performed an initial assessment on this suspected stroke patient at 1150.  History  No chief complaint on file.   Steve Alvarado is a 66 y.o. male.  Patient is a 66 year old male with a history of diabetes, CAD, hypertension, hyperlipidemia, thyroid disease who lives in a facility and has an issue with balance and uses a walker but is able to take care of his ADLs independently who is presenting today as a code stroke.  Patient was normal when he woke up this morning and nurses at the facility saw him and he was his normal self and then he went to take a shower and reported feeling dizzy.  He was last seen normal at 11 AM and they found him in the shower with complete left-sided deficits.  Patient has had slurred speech, left upper and lower extremity weakness as well as left-sided facial droop.  No visual changes or neglect noted by EMS.  Patient is not sure if he takes any blood thinners but reports has never had anything like this.  He is denying any chest pain, shortness of breath but does report a mild headache.  He has no neck pain or abdominal pain.  He reports the right side of his body feels normal.  The history is provided by the EMS personnel.      Home Medications Prior to Admission medications   Medication Sig Start Date End Date Taking? Authorizing Provider  amLODipine (NORVASC) 5 MG tablet Take 5 mg by mouth every morning. 05/19/21  Yes [provider]  ASPERCREME LIDOCAINE 4 % Place 1 patch onto the skin See admin instructions. Apply one patch topically to left abdomen every morning - leave on for 12 hours 07/12/21  Yes [provider]  atorvastatin (LIPITOR) 40 MG tablet Take 1 tablet (40 mg total) by mouth every evening. 10/23/20 08/12/21 Yes Delfino Lovett, MD  carvedilol (COREG) 3.125 MG tablet Take 1  tablet (3.125 mg total) by mouth 2 (two) times daily with a meal. Patient taking differently: Take 3.125 mg by mouth every 12 (twelve) hours. 10/23/20 08/12/21 Yes Delfino Lovett, MD  citalopram (CELEXA) 20 MG tablet Take 1 tablet (20 mg total) by mouth daily. Patient taking differently: Take 20 mg by mouth every morning. 10/23/20 08/12/21 Yes Delfino Lovett, MD  gabapentin (NEURONTIN) 400 MG capsule Take 400 mg by mouth 3 (three) times daily. 8am, 12pm 4pm 06/03/21  Yes [provider]  insulin glargine-yfgn (SEMGLEE) 100 UNIT/ML Pen Inject 30 Units into the skin every 12 (twelve) hours.   Yes [provider]  insulin lispro (HUMALOG) 100 UNIT/ML injection Inject 0.03 mLs (3 Units total) into the skin 3 (three) times daily with meals. 10/23/20  Yes Delfino Lovett, MD  isosorbide dinitrate (ISORDIL) 30 MG tablet Take 30 mg by mouth every morning. 06/09/21  Yes [provider]  levothyroxine (SYNTHROID) 75 MCG tablet Take 75 mcg by mouth See admin instructions. Take one tablet (75 mcg) by mouth daily at 10pm 05/19/21  Yes [provider]  losartan (COZAAR) 50 MG tablet Take 50 mg by mouth every morning. 05/19/21  Yes [provider]  metFORMIN (GLUCOPHAGE) 1000 MG tablet Take 1,000 mg by mouth 2 (two) times daily. 05/19/21  Yes [provider]  pantoprazole (PROTONIX) 40 MG tablet Take 40 mg by mouth every morning. 05/19/21  Yes [provider]      Allergies    Ace inhibitors    Review of Systems   Review of Systems  Physical Exam Updated Vital Signs BP (!) 155/92   Pulse 63   Temp (!) 97.5 F (36.4 C) (Oral)   Resp (!) 9   Ht  (1.88 m)   Wt 94.8 kg   SpO2 97%   BMI 26.83 kg/m  Physical Exam Vitals and nursing note reviewed.  Constitutional:      General: He is not in acute distress.    Appearance: He is well-developed.  HENT:     Head: Normocephalic and atraumatic.  Eyes:     General: No visual field deficit.    Conjunctiva/sclera:  Conjunctivae normal.     Pupils: Pupils are equal, round, and reactive to light.  Cardiovascular:     Rate and Rhythm: Normal rate and regular rhythm.     Heart sounds: No murmur heard. Pulmonary:     Effort: Pulmonary effort is normal. No respiratory distress.     Breath sounds: Normal breath sounds. No wheezing or rales.  Abdominal:     General: There is no distension.     Palpations: Abdomen is soft.     Tenderness: There is no abdominal tenderness. There is no guarding or rebound.  Musculoskeletal:        General: No tenderness. Normal range of motion.     Cervical back: Normal range of motion and neck supple.  Skin:    General: Skin is warm and dry.     Findings: No erythema or rash.  Neurological:     Mental Status: He is alert and oriented to person, place, and time.     Cranial Nerves: Facial asymmetry present.     Coordination: Finger-Nose-Finger Test normal.     Comments: Patient has some slurred speech but no significant aphasia.  Left-sided facial droop.  2 out of 5 strength in the left upper and lower extremity.  Sensation is intact.  5 out of 5 strength in the right upper and lower extremity.  Unable to ambulate at this time.  Psychiatric:        Behavior: Behavior normal.    ED Results / Procedures / Treatments   Labs (all labs ordered are listed, but only abnormal results are displayed) Labs Reviewed  CBC - Abnormal; Notable for the following components:      Result Value   RBC 3.59 (*)    Hemoglobin 10.9 (*)    HCT 32.3 (*)    All other components within normal limits  COMPREHENSIVE METABOLIC PANEL - Abnormal; Notable for the following components:   Chloride 112 (*)    CO2 19 (*)    Glucose, Bld 164 (*)    BUN 33 (*)    Creatinine, Ser 1.59 (*)    Total Bilirubin 0.2 (*)    GFR, Estimated 47 (*)    All other components within normal limits  I-STAT CHEM 8, ED - Abnormal; Notable for the following components:   BUN 33 (*)    Creatinine, Ser 1.70 (*)     Glucose, Bld 164 (*)    Calcium, Ion 1.03 (*)    TCO2 20 (*)    Hemoglobin 10.2 (*)    HCT 30.0 (*)    All other components within normal limits  CBG MONITORING, ED - Abnormal; Notable for the following components:   Glucose-Capillary 158 (*)    All other components within normal limits  MRSA NEXT GEN BY PCR, NASAL  PROTIME-INR  APTT  DIFFERENTIAL  HIV ANTIBODY (ROUTINE TESTING W REFLEX)  MAGNESIUM  PHOSPHORUS  TYPE AND SCREEN  ABO/RH    EKG EKG Interpretation  Date/Time:  Thursday Aug 12 2021 12:24:30 EDT Ventricular Rate:  68 PR Interval:  263 QRS Duration: 91 QT Interval:  424 QTC Calculation: 451 R Axis:   37 Text Interpretation: Sinus rhythm Prolonged PR interval No significant change since last tracing Confirmed by Gwyneth Sprout ([id number]) on 08/12/2021 3:14:50 PM  Radiology CT HEAD CODE STROKE WO CONTRAST  Result Date: 08/12/2021 CLINICAL DATA:  Code stroke. Neuro deficit, acute, stroke suspected. EXAM: CT HEAD WITHOUT CONTRAST TECHNIQUE: Contiguous axial images were obtained from the base of the skull through the vertex without intravenous contrast. RADIATION DOSE REDUCTION: This exam was performed according to the departmental dose-optimization program which includes automated exposure control, adjustment of the mA and/or kV according to patient size and/or use of iterative reconstruction technique. COMPARISON:  07/08/2021 FINDINGS: Brain: Mild age related volume loss. Mild chronic small-vessel ischemic change of the white matter. No sign of acute infarction, mass lesion, hemorrhage, hydrocephalus or extra-axial collection. Vascular: There is atherosclerotic calcification of the major vessels at the base of the brain. Skull: Normal Sinuses/Orbits: Mild mucosal thickening. No advanced disease. Orbits negative. Other: None ASPECTS ([place] Stroke Program Early CT Score) - Ganglionic level infarction (caudate, lentiform nuclei, internal capsule, insula, M1-M3 cortex): 7 -  Supraganglionic infarction (M4-M6 cortex): 3 Total score (0-10 with 10 being normal): 10 IMPRESSION: 1. No acute CT finding. Mild age related volume loss and small-vessel change of the white matter. 2. ASPECTS is 10. 3. These results were communicated to Dr. Thomasena Edis at 12:05 pm on 08/12/2021 by text page via the [facility] messaging system. Electronically Signed   By: Paulina Fusi M.D.   On: 08/12/2021 12:05   CT ANGIO HEAD NECK W WO CM (CODE STROKE)  Result Date: 08/12/2021 CLINICAL DATA:  Acute neuro deficit.  Rule out stroke EXAM: CT ANGIOGRAPHY HEAD AND NECK TECHNIQUE: Multidetector CT imaging of the head and neck was performed using the standard protocol during bolus administration of intravenous contrast. Multiplanar CT image reconstructions and MIPs were obtained to evaluate the vascular anatomy. Carotid stenosis measurements (when applicable) are obtained utilizing NASCET criteria, using the distal internal carotid diameter as the denominator. RADIATION DOSE REDUCTION: This exam was performed according to the departmental dose-optimization program which includes automated exposure control, adjustment of the mA and/or kV according to patient size and/or use of iterative reconstruction technique. CONTRAST:  67mL OMNIPAQUE IOHEXOL 350 MG/ML SOLN COMPARISON:  CT head 08/12/2021 FINDINGS: CTA NECK FINDINGS Aortic arch: Limited evaluation of the arch. Atherosclerotic calcification in the aortic arch. Proximal great vessels patent. Right carotid system: Mild atherosclerotic calcification right carotid bulb. Negative for right carotid stenosis. Left carotid system: Mild atherosclerotic calcification left carotid bulb. Negative for stenosis Vertebral arteries: Both vertebral arteries are patent to the skull base without stenosis. Atherosclerotic disease in the both vertebral arteries at the foramen magnum. Skeleton: Negative Other neck: Negative Upper chest: Lung apices clear bilaterally. Review of the MIP images  confirms the above findings CTA HEAD FINDINGS Anterior circulation: Atherosclerotic calcification throughout the cavernous carotid bilaterally without significant stenosis. Anterior and middle cerebral arteries patent bilaterally. No stenosis or large vessel occlusion. Negative for aneurysm. Posterior circulation: Atherosclerotic calcification of the distal vertebral artery bilaterally at the skull base. Moderate stenosis on the right and mild stenosis on the left. Left PICA  patent. Right PICA not visualized. Right AICA patent. Basilar patent without stenosis. Superior cerebellar and posterior cerebral arteries patent bilaterally. Mild stenosis proximal posterior cerebral artery bilaterally. Fetal origin of the left posterior cerebral artery. Right posterior communicating artery patent. Venous sinuses: Normal venous enhancement Anatomic variants: None Review of the MIP images confirms the above findings IMPRESSION: 1. Negative for intracranial large vessel occlusion or significant stenosis 2. Mild atherosclerotic disease in the carotid bifurcation bilaterally without stenosis 3. Moderate stenosis distal right vertebral artery mild stenosis distal left vertebral artery. Electronically Signed   By: Marlan Palau M.D.   On: 08/12/2021 12:23    Procedures Procedures    Medications Ordered in ED Medications  sodium chloride flush (NS) 0.9 % injection 3 mL (3 mLs Intravenous Not Given 08/12/21 1353)  0.9 %  sodium chloride infusion ( Intravenous Infusion Verify 08/12/21 1500)  acetaminophen (TYLENOL) tablet 650 mg (has no administration in time range)    Or  acetaminophen (TYLENOL) 160 MG/5ML solution 650 mg (has no administration in time range)    Or  acetaminophen (TYLENOL) suppository 650 mg (has no administration in time range)  senna-docusate (Senokot-S) tablet 1 tablet (has no administration in time range)  pantoprazole (PROTONIX) injection 40 mg (has no administration in time range)  clevidipine  (CLEVIPREX) infusion 0.5 mg/mL (0 mg/hr Intravenous Hold 08/12/21 1310)  labetalol (NORMODYNE) injection 10 mg (has no administration in time range)  Chlorhexidine Gluconate Cloth 2 % PADS 6 each (6 each Topical Given 08/12/21 1311)  insulin aspart (novoLOG) injection 0-15 Units (has no administration in time range)  iohexol (OMNIPAQUE) 350 MG/ML injection 80 mL (80 mLs Intravenous Contrast Given 08/12/21 1201)  tenecteplase (TNKASE) injection for Stroke 24 mg (24 mg Intravenous Given 08/12/21 1204)   stroke: early stages of recovery book ( Does not apply Given 08/12/21 1354)    ED Course/ Medical Decision Making/ A&P                           Medical Decision Making Amount and/or Complexity of Data Reviewed Independent Historian: EMS External Data Reviewed: notes. Labs: ordered. Decision-making details documented in ED Course. Radiology: ordered and independent interpretation performed. Decision-making details documented in ED Course. ECG/medicine tests: ordered and independent interpretation performed. Decision-making details documented in ED Course.  Risk Decision regarding hospitalization.   Pt with multiple medical problems and comorbidities and presenting today with a complaint that caries a high risk for morbidity and mortality.  Presenting today as a code stroke with dense left-sided deficits.  Patient's last seen normal was 11 AM this morning per the facility where he lives.  Patient is awake and alert but has an NIH of 8 for left-sided facial upper and lower extremity weakness and slurred speech.  Patient is maintaining his airway at this time.  No records of him being on any anticoagulation.  Stroke team is at bedside upon patient's arrival.  I independently interpreted and visualized his head CT which shows no evidence of bleed.  TNKase will be administered given patient's symptoms.  CTA is still pending.  Patient's blood sugar today is 158. I independently interpreted patient's EKG  and labs.  CBC with stable hemoglobin and normal white count, CMP with creatinine of 1.59, normal sodium and potassium and blood sugar 164, coags within normal limits.  Because of patient's persistent symptoms he did receive thrombolytics.  He was admitted to the neurology service.  CRITICAL CARE Performed by: [facility]  Total critical care time: 40 minutes Critical care time was exclusive of separately billable procedures and treating other patients. Critical care was necessary to treat or prevent imminent or life-threatening deterioration. Critical care was time spent personally by me on the following activities: development of treatment plan with patient and/or surrogate as well as nursing, discussions with consultants, evaluation of patient's response to treatment, examination of patient, obtaining history from patient or surrogate, ordering and performing treatments and interventions, ordering and review of laboratory studies, ordering and review of radiographic studies, pulse oximetry and re-evaluation of patient's condition.           Final Clinical Impression(s) / ED Diagnoses Final diagnoses:  Cerebrovascular accident (CVA), unspecified mechanism [facility])    Rx / DC Orders ED Discharge Orders     None         Gwyneth Sprout, MD 08/12/21 1516

## 2021-08-13 ENCOUNTER — Inpatient Hospital Stay (HOSPITAL_COMMUNITY): Payer: Medicare Other

## 2021-08-13 DIAGNOSIS — I63421 Cerebral infarction due to embolism of right anterior cerebral artery: Secondary | ICD-10-CM | POA: Diagnosis not present

## 2021-08-13 LAB — GLUCOSE, CAPILLARY
Glucose-Capillary: 138 mg/dL — ABNORMAL HIGH (ref 70–99)
Glucose-Capillary: 132 mg/dL — ABNORMAL HIGH (ref 70–99)
Glucose-Capillary: 149 mg/dL — ABNORMAL HIGH (ref 70–99)
Glucose-Capillary: 156 mg/dL — ABNORMAL HIGH (ref 70–99)

## 2021-08-13 LAB — RAPID URINE DRUG SCREEN, HOSP PERFORMED
Amphetamines: NOT DETECTED
Barbiturates: NOT DETECTED
Benzodiazepines: NOT DETECTED
Cocaine: NOT DETECTED
Opiates: NOT DETECTED
Tetrahydrocannabinol: NOT DETECTED

## 2021-08-13 LAB — LIPID PANEL
Cholesterol: 146 mg/dL (ref 0–200)
HDL: 66 mg/dL (ref >40)
LDL Cholesterol: 60 mg/dL (ref 0–99)
Total CHOL/HDL Ratio: 2.2 RATIO
Triglycerides: 99 mg/dL (ref <150)
VLDL: 20 mg/dL (ref 0–40)

## 2021-08-13 LAB — HEMOGLOBIN A1C
Hgb A1c MFr Bld: 7.9 % — ABNORMAL HIGH (ref 4.8–5.6)
Mean Plasma Glucose: 180.03 mg/dL

## 2021-08-13 IMAGING — MR MR HEAD W/O CM
8 of 10 series · 34 of 48 positions shown · non-contrast
Comparison: None Available.

CLINICAL DATA: Stroke, follow-up. Status HUERTA. Left-sided
weakness. Slurred speech.

EXAM:
MRI HEAD WITHOUT CONTRAST
TECHNIQUE: Multiplanar, multiecho pulse sequences of the brain and surrounding
structures were obtained without intravenous contrast.

[Series 3: DWI · axial · 3.0mm · 1.09mm/px · z∈[-52,+106]mm · 8 of 108 slices shown (1 of 4)]
[im 1/108]
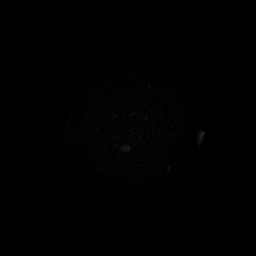
[im 12/108]
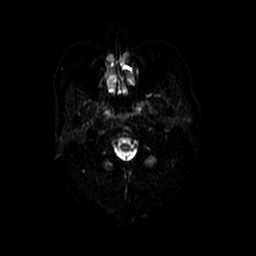
[im 36/108]
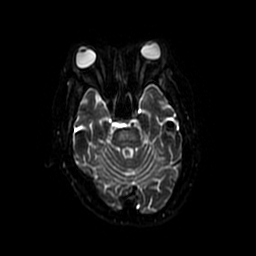
[im 48/108]
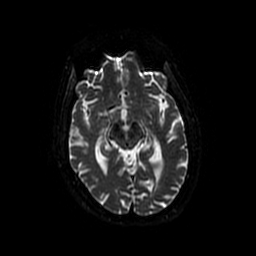
[im 60/108]
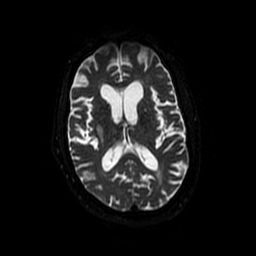
[im 72/108]
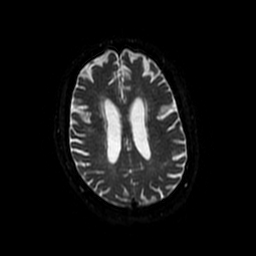
[im 96/108]
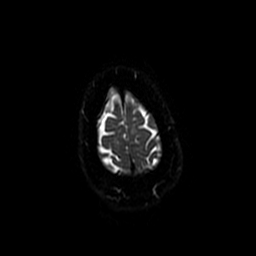
[im 108/108]
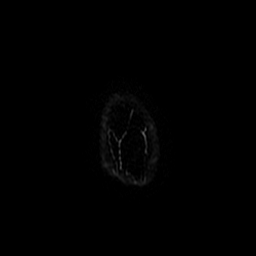

[Series 4: DWI · coronal · 5.0mm · 1.09mm/px · 7 of 74 slices shown (2 of 4)]
[im 1/74]
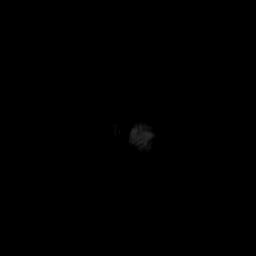
[im 13/74]
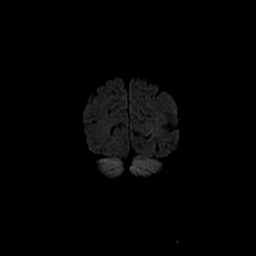
[im 25/74]
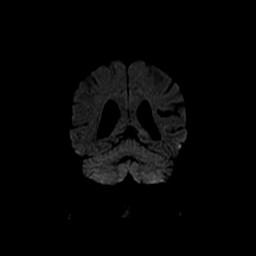
[im 37/74]
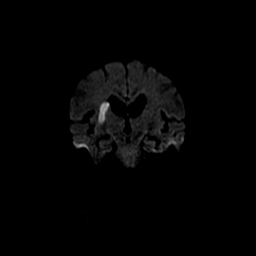
[im 49/74]
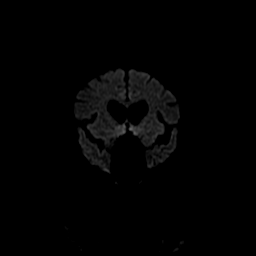
[im 61/74]
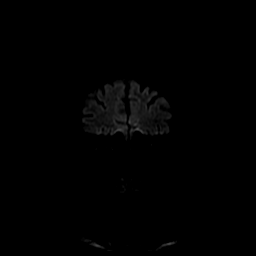
[im 74/74]
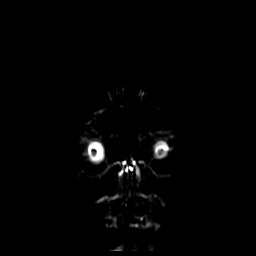

[Series 5: T1 · sagittal · 5.0mm · 0.47mm/px · 2 of 25 slices shown]
[im 1/25]
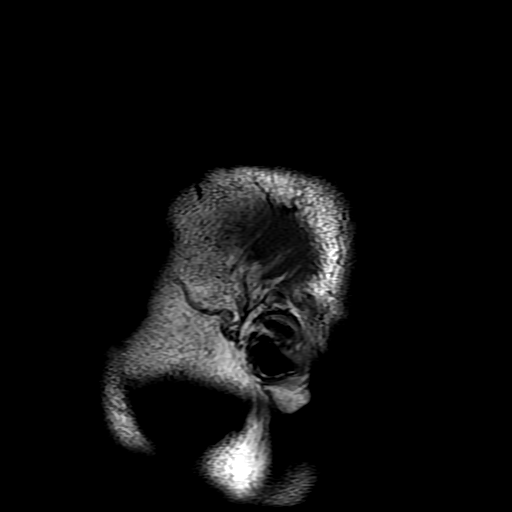
[im 25/25]
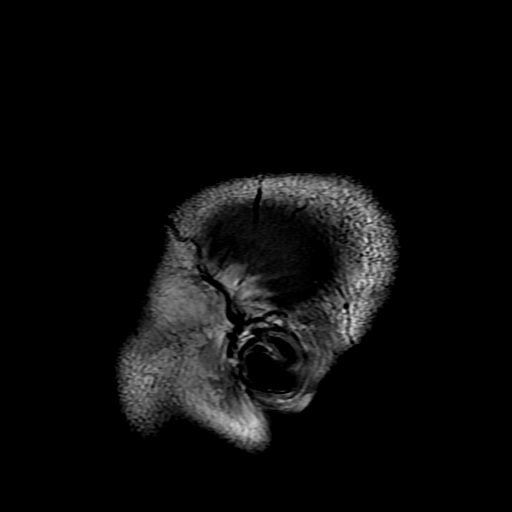

[Series 6: T2 · axial · 5.0mm · 0.43mm/px · z∈[-53,+102]mm · 3 of 27 slices shown (1 of 2)]
[im 1/27]
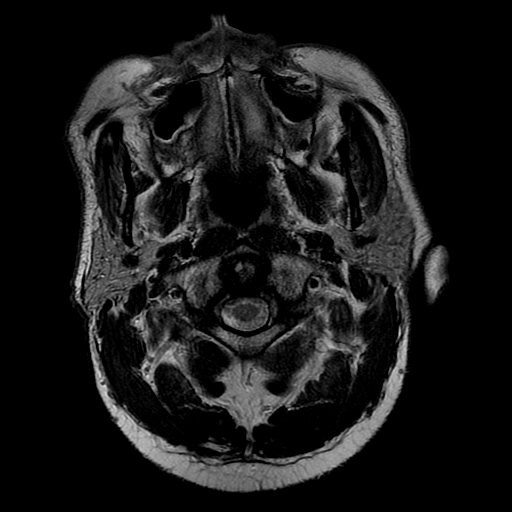
[im 14/27]
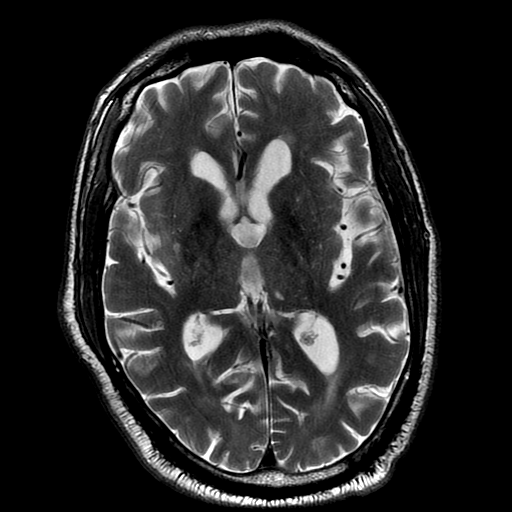
[im 27/27]
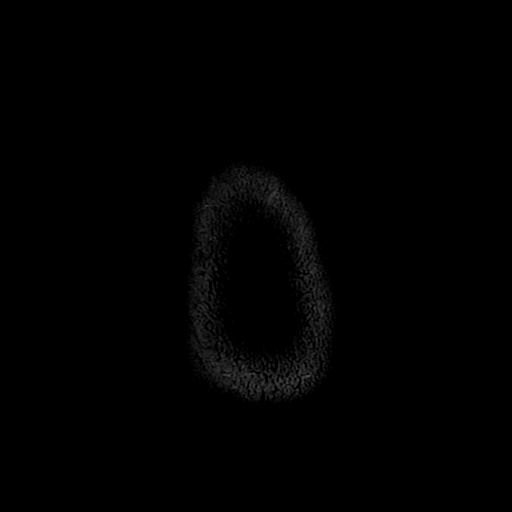

[Series 7: FLAIR · axial · 3.0mm · 0.43mm/px · z∈[-53,+102]mm · 3 of 27 slices shown]
[im 1/27]
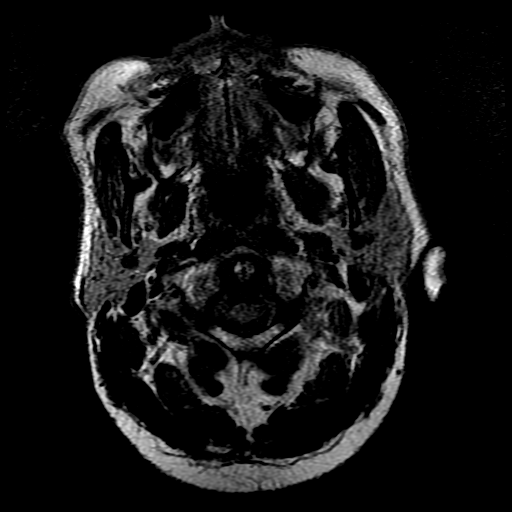
[im 14/27]
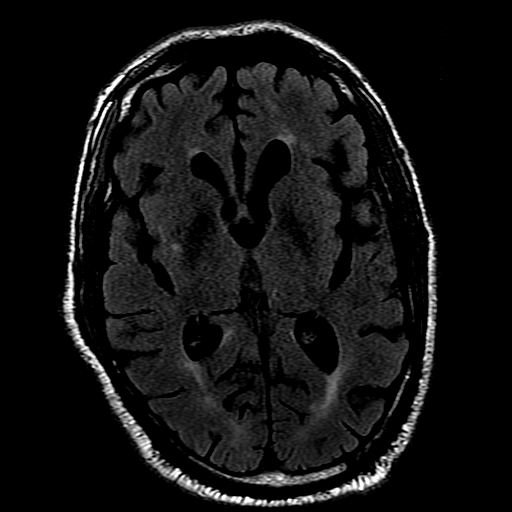
[im 27/27]
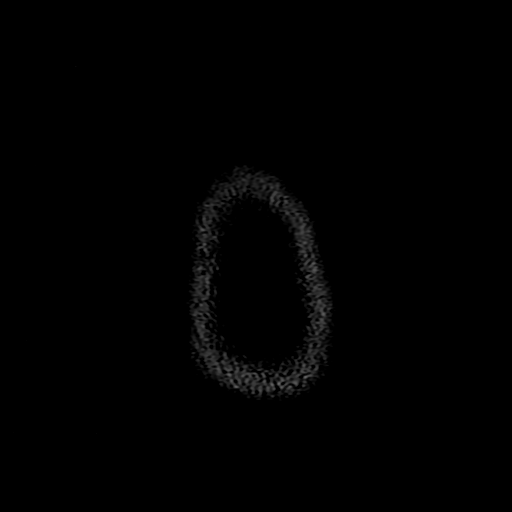

[Series 10: T2 · coronal · 5.0mm · 0.39mm/px · 3 of 28 slices shown (2 of 2)]
[im 1/28]
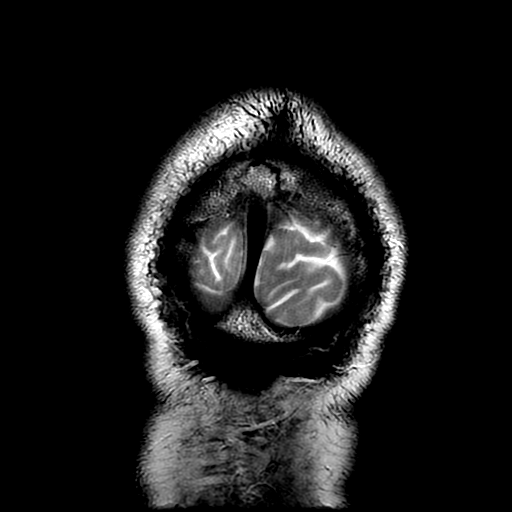
[im 14/28]
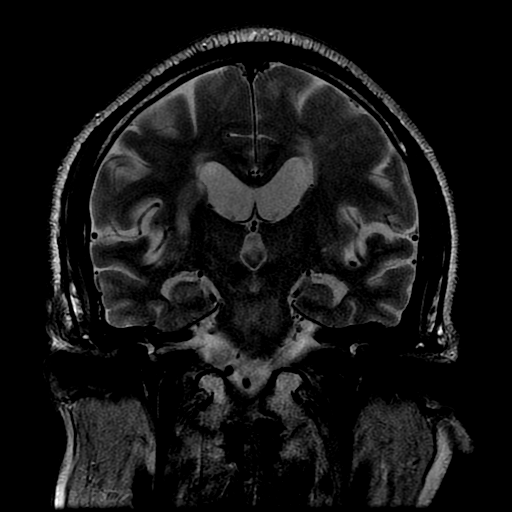
[im 28/28]
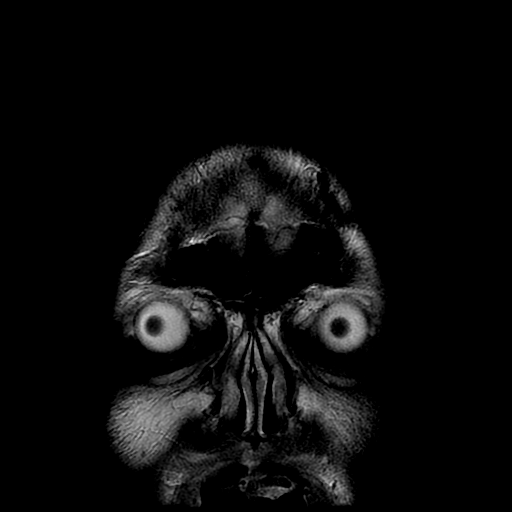

[Series 300: DWI · axial · 3.0mm · 1.09mm/px · z∈[-52,+106]mm · 5 of 54 slices shown (3 of 4)]
[im 1/54]
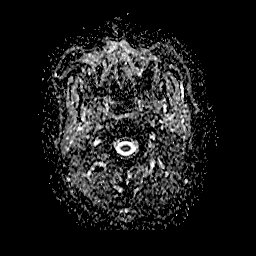
[im 14/54]
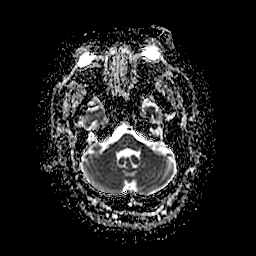
[im 27/54]
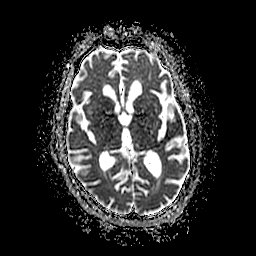
[im 40/54]
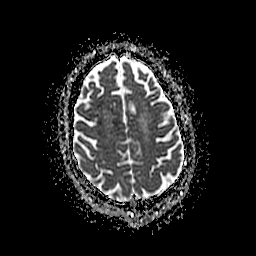
[im 54/54]
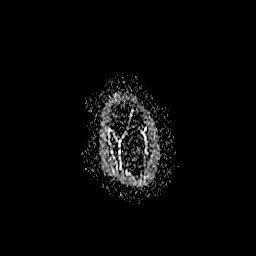

[Series 400: DWI · coronal · 5.0mm · 1.09mm/px · 3 of 37 slices shown (4 of 4)]
[im 1/37]
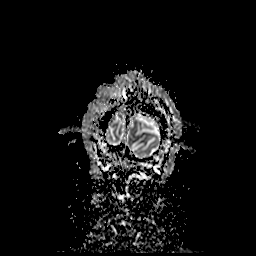
[im 19/37]
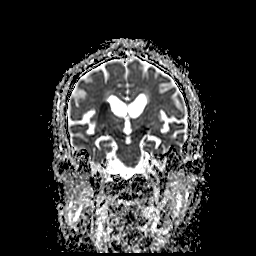
[im 37/37]
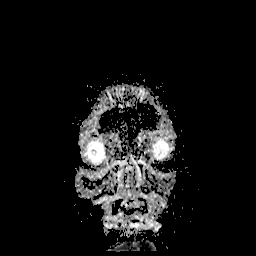

[34 of 48 positions shown; findings below may reference images not displayed]

FINDINGS: Brain: Diffusion-weighted images demonstrate no acute nonhemorrhagic
infarct involving the posterior right lentiform nucleus and corona
radiata. The maximum AP dimension is 3.4 cm. T2 signal changes are
associated with the area of acute infarction.

Generalized atrophy and periventricular white matter changes are
otherwise moderately advanced for age. Diffuse white matter changes
extend into the brainstem. Remote lacunar infarcts are present the
posterior cerebellum bilaterally. A remote lacunar infarct is also
present within the left thalamus.

The ventricles are proportionate to the degree of atrophy. No acute
hemorrhage or mass lesion is present. No significant extraaxial
fluid collection is present. The internal auditory canals are within
normal limits.

Vascular: Flow is present in the major intracranial arteries.

Skull and upper cervical spine: The craniocervical junction is
normal. Upper cervical spine is within normal limits. Marrow signal
is unremarkable.

Sinuses/Orbits: Fluid is present in the inferior mastoid air cells
bilaterally. No obstructing nasopharyngeal lesion is present. The
paranasal sinuses and mastoid air cells are otherwise clear. The
globes and orbits are within normal limits.
IMPRESSION: 1. Acute nonhemorrhagic infarct involving the posterior right
lentiform nucleus and corona radiata measure.
2. Generalized atrophy and white matter disease is moderately
advanced for age. This likely reflects the sequela of chronic
microvascular ischemia.
3. Remote lacunar infarcts of the posterior cerebellum bilaterally
and left thalamus.

## 2021-08-13 MED ORDER — ATORVASTATIN CALCIUM 40 MG PO TABS
40.0000 mg | ORAL_TABLET | Freq: Every day | ORAL | Status: DC
  Administered 2021-08-13 – 2021-08-18 (×6): 40 mg via ORAL
  Filled 2021-08-13 (×6): qty 1

## 2021-08-13 MED ORDER — GABAPENTIN 400 MG PO CAPS
400.0000 mg | ORAL_CAPSULE | Freq: Every day | ORAL | Status: DC
  Administered 2021-08-14 – 2021-08-17 (×5): 400 mg via ORAL
  Filled 2021-08-13 (×5): qty 1

## 2021-08-13 MED ORDER — MAGNESIUM SULFATE 2 GM/50ML IV SOLN
2.0000 g | Freq: Once | INTRAVENOUS | Status: AC
  Administered 2021-08-13: 2 g via INTRAVENOUS
  Filled 2021-08-13: qty 50

## 2021-08-13 MED ORDER — MELATONIN 3 MG PO TABS
3.0000 mg | ORAL_TABLET | Freq: Every evening | ORAL | Status: DC | PRN
Start: 2021-08-13 — End: 2021-08-16
  Administered 2021-08-14 – 2021-08-15 (×3): 3 mg via ORAL
  Filled 2021-08-13 (×3): qty 1

## 2021-08-13 NOTE — Evaluation (Signed)
Clinical/Bedside Swallow Evaluation Patient Details  Name: Steve Alvarado MRN: [id number] Date of Birth: 03/08/1956  Today's Date: 08/13/2021 Time: SLP Start Time (ACUTE ONLY): 1315 SLP Stop Time (ACUTE ONLY): 1326 SLP Time Calculation (min) (ACUTE ONLY): 11 min  Past Medical History:  Past Medical History:  Diagnosis Date   Coronary artery disease    Diabetes mellitus    Type II   Hyperlipidemia    Hypertension    Thyroid disease    Past Surgical History:  Past Surgical History:  Procedure Laterality Date   CARDIAC CATHETERIZATION  08/06/2010   Bare metal stent placed in RCA.   HAND SURGERY     left   HPI:  Patient is a 66 yo M who presented to ED with acute onset left sided weakness resulting in a fall and left facial droop.  CT reveals no acute intracranial process, MRI pending.  TNK was given in the ED to treat his stroke.  Pt states his deficits have not improved. Pt with past medical history of CAD, HTN, HLD, DM and thyroid disease.    Assessment / Plan / Recommendation  Clinical Impression  Pt presents with a mild oral dysphagia c/b L sided weaness resulting in prolonged oral phase and mild oral reside on L side of oral cavity with solids.  The amount of residue was consistent across regular, soft, and simulated ground consistency textures.  Pt is aware of weakness on L side and notes it is difficult for him to chew, but is also able to use lingual sweep independently.  Pt benefited from liquid was to reduce residuals.  Discussed diet preference with pt who would like to have a regular texture diet understanding that he will have to work harder to chew food and/or choose softer foods as needed.  There were no clinical s/s of aspiration with any consistencies trialed.  Should MRI be concerning for increased risk of silent aspiration, consider MBSS.    Recommend regular texture diet with thin liquids.    Pt's endorses changes to speech and he sounds dysarthric, but was 100%  intellible in conversation with SLP today.  No obvious word finding defiicts noted.  Full cognitive linguistic evaluation to follow.  SLP Visit Diagnosis: Dysphagia, oral phase (R13.11)    Aspiration Risk  No limitations    Diet Recommendation Regular;Thin liquid   Liquid Administration via: Cup;Straw  Medication Administration: Whole meds with liquid (As tolerated)  Supervision: Staff to assist with self feeding (Please cut foods for pt)  Compensations:  Slow rate; Small sips/bites; Follow solids with liquid; Lingual sweep for clearance of pocketing (L side)  Postural Changes: Seated upright at 90 degrees    Other  Recommendations Oral Care Recommendations: Oral care BID    Recommendations for follow up therapy are one component of a multi-disciplinary discharge planning process, led by the attending physician.  Recommendations may be updated based on patient status, additional functional criteria and insurance authorization.  Follow up Recommendations Acute inpatient rehab (3hours/day)      Assistance Recommended at Discharge Intermittent Supervision/Assistance  Functional Status Assessment Patient has had a recent decline in their functional status and demonstrates the ability to make significant improvements in function in a reasonable and predictable amount of time.  Frequency and Duration min 2x/week  2 weeks       Prognosis Prognosis for Safe Diet Advancement: Good      Swallow Study   General Date of Onset: 08/12/21 HPI: Patient is a 66 yo M  who presented to ED with acute onset left sided weakness resulting in a fall and left facial droop.  CT reveals no acute intracranial process, MRI pending.  TNK was given in the ED to treat his stroke.  Pt states his deficits have not improved. Pt with past medical history of CAD, HTN, HLD, DM and thyroid disease. Type of Study: Bedside Swallow Evaluation Previous Swallow Assessment: None Diet Prior to this Study: Dysphagia  2 (chopped) Temperature Spikes Noted: No History of Recent Intubation: No Behavior/Cognition: Alert;Cooperative;Pleasant mood Oral Cavity Assessment: Within Functional Limits Oral Care Completed by SLP: No Oral Cavity - Dentition: Dentures, top;Dentures, bottom Vision: Functional for self-feeding Self-Feeding Abilities: Needs assist Patient Positioning: Upright in bed Baseline Vocal Quality: Normal Volitional Cough: Strong Volitional Swallow: Able to elicit    Oral/Motor/Sensory Function Overall Oral Motor/Sensory Function: Moderate impairment Facial ROM: Reduced left Facial Symmetry: Abnormal symmetry left Lingual ROM: Within Functional Limits Lingual Symmetry: Within Functional Limits Lingual Strength: Reduced Velum: Within Functional Limits Mandible: Within Functional Limits   Ice Chips Ice chips: Not tested   Thin Liquid Thin Liquid: Within functional limits Presentation: Cup;Straw    Nectar Thick Nectar Thick Liquid: Not tested   Honey Thick Honey Thick Liquid: Not tested   Puree Puree: Within functional limits   Solid     Solid: Impaired Oral Phase Functional Implications: Oral residue;Prolonged oral transit      Kerrie Pleasure, MA, CCC-SLP Acute Rehabilitation Services Office: [phone] 08/13/2021,1:43 PM
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
## 2021-08-13 NOTE — Progress Notes (Signed)
Patient ID: Steve Alvarado, male   DOB: 1955/07/23, 66 y.o.   MRN: [id number]  Asked for sleep aid  Plan:  Melatonin 3 mg QHS PRN Per med rec he takes gabapentin 400 mg TID (per patient takes 300). Given renal function and recent stroke, will give 300 mg nightly for now Estimated Creatinine Clearance: 49 mL/min (A) (by C-G formula based on SCr of 1.7 mg/dL (H)).  Full med rec to be completed by day team  Brooke Dare MD-PhD [facility] [phone]

## 2021-08-13 NOTE — Progress Notes (Signed)
Inpatient Rehab Admissions Coordinator Note:   Per PT recommendations patient was screened for CIR candidacy by Stephania Fragmin, PT. At this time, pt appears to be a potential candidate for CIR. I will place an order for rehab consult for full assessment, per our protocol.  Please contact me any with questions.Estill Dooms, PT, DPT [phone] 08/13/21 2:57 PM

## 2021-08-13 NOTE — Progress Notes (Signed)
OT Cancellation Note  Patient Details Name: FAROUK VIVERO MRN: [id number] DOB: 11-21-1955   Cancelled Treatment:    Reason Eval/Treat Not Completed: Active bedrest order.  Ignacia Palma, OTR/L Acute Rehab Services Pager [phone] Office [phone]    Evette Georges 08/13/2021, 8:16 AM

## 2021-08-13 NOTE — Evaluation (Signed)
Occupational Therapy Evaluation Patient Details Name: Steve Alvarado MRN: [id number] DOB: 1955-10-05 Today's Date: 08/13/2021   History of Present Illness 66 y.o. male presents to [facility] hospital on 08/12/2021 with L weakness and facial droop. CTH negative. Pt received TNK. Awaiting MRI. PMH includes CAD, HTN, HLD, DM and thyroid disease.   Clinical Impression   This 66 yo male admitted from [facility] where he was there for rehab per his report and he was getting better with his balance and ADLs. Currently pt is unable to  consistently maintain his sitting (lateral lean with intermittent pushing with RUE) balance nor standing balance (left lateral lean), vision deficits (that need to be further assessed in functional context), absent movement and decreased sensation left side (UE and LE) all affecting his ability to safely and independently do his basic ADLs and associated mobility. He will continue to benefit from acute OT with follow up now recommended on AIR.     Recommendations for follow up therapy are one component of a multi-disciplinary discharge planning process, led by the attending physician.  Recommendations may be updated based on patient status, additional functional criteria and insurance authorization.   Follow Up Recommendations  Acute inpatient rehab (3hours/day)    Assistance Recommended at Discharge Frequent or constant Supervision/Assistance  Patient can return home with the following Two people to help with bathing/dressing/bathroom;Two people to help with walking and/or transfers;Assistance with cooking/housework;Assistance with feeding;Help with stairs or ramp for entrance;Assist for transportation;Direct supervision/assist for financial management;Direct supervision/assist for medications management    Functional Status Assessment  Patient has had a recent decline in their functional status and demonstrates the ability to make significant improvements in  function in a reasonable and predictable amount of time.  Equipment Recommendations  Other (comment) (TBD next venue)       Precautions / Restrictions Precautions Precautions: Fall Restrictions Weight Bearing Restrictions: No      Mobility Bed Mobility Overal bed mobility: Needs Assistance Bed Mobility: Rolling, Sidelying to Sit Rolling: Mod assist Sidelying to sit: Max assist, +2 for physical assistance            Transfers Overall transfer level: Needs assistance Equipment used: 1 person hand held assist, 2 person hand held assist Transfers: Sit to/from Stand, Bed to chair/wheelchair/BSC Sit to Stand: Max assist, +2 physical assistance Stand pivot transfers: Max assist, +2 physical assistance         General transfer comment: pt benefits from UE support and bilateral knee block, left lateral lean in standing. Pt stands, transfers from bed to chair, stands from chair, and then transfers back to bed.      Balance Overall balance assessment: Needs assistance Sitting-balance support: Single extremity supported, Feet supported Sitting balance-Leahy Scale: Poor Sitting balance - Comments: maxA, left lateral lean. Pt is able to lean onto R elbow, otherwise intermittent pushing through extended RUE Postural control: Left lateral lean Standing balance support: Single extremity supported, Bilateral upper extremity supported Standing balance-Leahy Scale: Poor Standing balance comment: maxA, left lean                           ADL either performed or assessed with clinical judgement   ADL Overall ADL's : Needs assistance/impaired Eating/Feeding: Set up;Bed level   Grooming: Moderate assistance;Bed level   Upper Body Bathing: Moderate assistance;Bed level   Lower Body Bathing: Total assistance;Bed level   Upper Body Dressing : Bed level;Maximal assistance   Lower Body Dressing:  Total assistance;Bed level   Toilet Transfer: Maximal assistance;+2 for  physical assistance;Stand-pivot;Squat-pivot   Toileting- Clothing Manipulation and Hygiene: Total assistance Toileting - Clothing Manipulation Details (indicate cue type and reason): Mod A for standing             Vision Baseline Vision/History: 1 Wears glasses Ability to See in Adequate Light: 0 Adequate Patient Visual Report: No change from baseline Vision Assessment?: Vision impaired- to be further tested in functional context            Pertinent Vitals/Pain Pain Assessment Pain Assessment: Faces Faces Pain Scale: Hurts even more Pain Location: hands Pain Descriptors / Indicators: Aching Pain Intervention(s): Monitored during session, Repositioned     Hand Dominance Right   Extremity/Trunk Assessment Upper Extremity Assessment Upper Extremity Assessment: LUE deficits/detail RUE Deficits / Details: atrophy of intrinsic hand muscles RUE Sensation: history of peripheral neuropathy LUE Deficits / Details: flaccid LUE, PROM WFL, atrophy noted of the intrinsic hand muscles LUE Sensation: history of peripheral neuropathy   Lower Extremity Assessment Lower Extremity Assessment: RLE deficits/detail;LLE deficits/detail RLE Deficits / Details: grossly functional strenght and ROM RLE Sensation: history of peripheral neuropathy LLE Deficits / Details: LLE flaccid, one instance of knee extensor spasm noted LLE Sensation: history of peripheral neuropathy   Cervical / Trunk Assessment Cervical / Trunk Assessment: Normal   Communication Communication Communication: No difficulties   Cognition Arousal/Alertness: Awake/alert Behavior During Therapy: WFL for tasks assessed/performed Overall Cognitive Status: Impaired/Different from baseline Area of Impairment: Awareness                           Awareness: Emergent   General Comments: pt with impaired awareness of balance deficits, unable to correct balance deficits at this time     General Comments  pt with  hypertension, systolic BP ranging from 160-190 during session, RN aware            Home Living Family/patient expects to be discharged to:: Unsure                                 Additional Comments: [facility]      Prior Functioning/Environment Prior Level of Function : Needs assist             Mobility Comments: pt reports he has progressed to walking with an assistive device during recent rehab admission          OT Problem List: Decreased strength;Decreased range of motion;Impaired balance (sitting and/or standing);Impaired vision/perception;Decreased coordination;Decreased safety awareness;Impaired UE functional use;Impaired tone;Impaired sensation;Pain      OT Treatment/Interventions: Self-care/ADL training;Therapeutic exercise;Therapeutic activities;Neuromuscular education;Visual/perceptual remediation/compensation;Patient/family education;DME and/or AE instruction;Balance training    OT Goals(Current goals can be found in the care plan section) Acute Rehab OT Goals Patient Stated Goal: to get better OT Goal Formulation: With patient Time For Goal Achievement: 08/27/21 Potential to Achieve Goals: Good  OT Frequency: Min 2X/week    Co-evaluation PT/OT/SLP Co-Evaluation/Treatment: Yes Reason for Co-Treatment: Complexity of the patient's impairments (multi-system involvement);Necessary to address cognition/behavior during functional activity;For patient/therapist safety;To address functional/ADL transfers PT goals addressed during session: Mobility/safety with mobility;Balance;Strengthening/ROM OT goals addressed during session: Strengthening/ROM;ADL's and self-care      AM-PAC OT "6 Clicks" Daily Activity     Outcome Measure Help from another person eating meals?: A Little Help from another person taking care of  personal grooming?: A Lot Help from another person toileting, which includes using toliet, bedpan,  or urinal?: Total Help from another person bathing (including washing, rinsing, drying)?: Total Help from another person to put on and taking off regular upper body clothing?: A Lot Help from another person to put on and taking off regular lower body clothing?: Total 6 Click Score: 10   End of Session Equipment Utilized During Treatment: Gait belt Nurse Communication:  (bed alarm would not set)  Activity Tolerance: Patient tolerated treatment well Patient left: in bed;with call bell/phone within reach  OT Visit Diagnosis: Unsteadiness on feet (R26.81);Other abnormalities of gait and mobility (R26.89);Muscle weakness (generalized) (M62.81);Low vision, both eyes (H54.2);Other symptoms and signs involving cognitive function;Hemiplegia and hemiparesis;Pain Hemiplegia - Right/Left: Left Hemiplegia - dominant/non-dominant: Non-Dominant Hemiplegia - caused by: Cerebral infarction Pain - Right/Left:  (both (pre-exisiting a couple of years per pt)) Pain - part of body: Hand                Time: 7001-7494 OT Time Calculation (min): 43 min Charges:  OT General Charges $OT Visit: 1 Visit  Ignacia Palma, OTR/L Acute Rehab Services Pager [phone] Office [phone]    Evette Georges 08/13/2021, 3:00 PM

## 2021-08-13 NOTE — TOC CAGE-AID Note (Signed)
Transition of Care [facility]) - CAGE-AID Screening   Patient Details  Name: HIEP OLLIS MRN: [id number] Date of Birth: 1955/11/03  [facility] CM/SW Contact:    Bracy Pepper C Tarpley-Carter, LCSWA Phone Number: 08/13/2021, 3:14 PM   Clinical Narrative: Pt participated in Cage-Aid.  Pt stated he does use substance.  Pt was offered resources, due to usage of substance.    Garron Eline Tarpley-Carter, MSW, LCSW-A Pronouns:  She/Her/Hers [facility] Clinical Social Worker Direct Number:  [phone] [email or web address]   CAGE-AID Screening: Substance Abuse Screening unable to be completed due to: : Patient unable to participate  Have You Ever Felt You Ought to Cut Down on Your Drinking or Drug Use?: No Have People Annoyed You By Critizing Your Drinking Or Drug Use?: No Have You Felt Bad Or Guilty About Your Drinking Or Drug Use?: No Have You Ever Had a Drink or Used Drugs First Thing In The Morning to Steady Your Nerves or to Get Rid of a Hangover?: No CAGE-AID Score: 0  Substance Abuse Education Offered: Yes  Substance abuse interventions: Transport planner

## 2021-08-13 NOTE — Progress Notes (Signed)
Inpatient Diabetes Program Recommendations  AACE/ADA: New Consensus Statement on Inpatient Glycemic Control (2015)  Target Ranges:  Prepandial:   less than 140 mg/dL      Peak postprandial:   less than 180 mg/dL (1-2 hours)      Critically ill patients:  140 - 180 mg/dL   Lab Results  Component Value Date   GLUCAP 138 (H) 08/13/2021   HGBA1C 8.1 (H) 07/09/2021    Review of Glycemic Control  Latest Reference Range & Units 08/12/21 11:50 08/12/21 16:50 08/12/21 22:39 08/13/21 07:50  Glucose-Capillary 70 - 99 mg/dL 665 (H) 993 (H) 570 (H) 138 (H)  (H): Data is abnormally high Diabetes history: Type 2 DM Outpatient Diabetes medications: Semglee 30 units BID, Humalog 3 units TID, Metformin 1000 mg BID Current orders for Inpatient glycemic control: Semglee 15 units BID, Novolog 0-15 units TID  Inpatient Diabetes Program Recommendations:    Noted consult. Current glucose trends within target goals. In agreement with current plan.   Follow.   Thanks, Lujean Rave, MSN, RNC-OB Diabetes Coordinator [phone] (8a-5p)

## 2021-08-13 NOTE — Evaluation (Signed)
Physical Therapy Evaluation Patient Details Name: Steve Alvarado MRN: [id number] DOB: 02/22/1956 Today's Date: 08/13/2021  History of Present Illness  66 y.o. male [facility] to [facility] hospital on 08/12/2021 with L weakness and facial droop. CTH negative. Pt received TNK. Awaiting MRI. PMH includes CAD, HTN, HLD, DM and thyroid disease.  Clinical Impression  Pt presents to PT with deficits in functional mobility, balance, gait, strength, sensation, power. Pt with flaccid left side and persistent leftward lean unless leaned onto left elbow. Pt requires assistance for all functional mobility activities, pushing to left with RUE intermittently. Pt will benefit from aggressive mobilization in an effort to reduce falls risk and restore functional mobility. PT recommends AIR admission at this time.       Recommendations for follow up therapy are one component of a multi-disciplinary discharge planning process, led by the attending physician.  Recommendations may be updated based on patient status, additional functional criteria and insurance authorization.  Follow Up Recommendations Acute inpatient rehab (3hours/day)    Assistance Recommended at Discharge Intermittent Supervision/Assistance  Patient can return home with the following  Two people to help with walking and/or transfers;Two people to help with bathing/dressing/bathroom;Assistance with cooking/housework;Assistance with feeding;Direct supervision/assist for medications management;Direct supervision/assist for financial management;Assist for transportation;Help with stairs or ramp for entrance    Equipment Recommendations Wheelchair (measurements PT);BSC/3in1;Other (comment) (hoyer lift)  Recommendations for Other Services  Rehab consult    Functional Status Assessment Patient has had a recent decline in their functional status and demonstrates the ability to make significant improvements in function in a reasonable and predictable amount of  time.     Precautions / Restrictions Precautions Precautions: Fall Restrictions Weight Bearing Restrictions: No      Mobility  Bed Mobility Overal bed mobility: Needs Assistance Bed Mobility: Rolling, Sidelying to Sit Rolling: Mod assist Sidelying to sit: Max assist, +2 for physical assistance            Transfers Overall transfer level: Needs assistance Equipment used: 1 person hand held assist, 2 person hand held assist Transfers: Sit to/from Stand, Bed to chair/wheelchair/BSC Sit to Stand: Max assist, +2 physical assistance Stand pivot transfers: Max assist, +2 physical assistance         General transfer comment: pt benefits from UE support and bilateral knee block, left lateral lean in standing. Pt stands, transfers from bed to chair, stands from chair, and then transfers back to bed.    Ambulation/Gait                  Stairs            Wheelchair Mobility    Modified Rankin (Stroke Patients Only) Modified Rankin (Stroke Patients Only) Pre-Morbid Rankin Score: Moderately severe disability ([facility]) Modified Rankin: Severe disability     Balance Overall balance assessment: Needs assistance Sitting-balance support: Single extremity supported, Feet supported Sitting balance-Leahy Scale: Poor Sitting balance - Comments: maxA, left lateral lean. Pt is able to lean onto R elbow, otherwise intermittent pushing through extended RUE Postural control: Left lateral lean Standing balance support: Single extremity supported, Bilateral upper extremity supported Standing balance-Leahy Scale: Poor Standing balance comment: maxA, left lean                             Pertinent Vitals/Pain Pain Assessment Pain Assessment: Faces Faces Pain Scale: Hurts even more Pain Location: hands Pain Descriptors / Indicators: Aching Pain Intervention(s):  Monitored during session    Home Living Family/patient expects to be  discharged to:: Unsure                   Additional Comments: pt was at SNF prior to admission, [facility]    Prior Function Prior Level of Function : Needs assist             Mobility Comments: pt reports he has progressed to walking with an assistive device during recent rehab admission       Hand Dominance        Extremity/Trunk Assessment   Upper Extremity Assessment Upper Extremity Assessment: LUE deficits/detail;RUE deficits/detail RUE Deficits / Details: atrophy of intrinsic hand muscles RUE Sensation: history of peripheral neuropathy LUE Deficits / Details: flaccid LUE, PROM WFL, atrophy noted of the intrinsic hand muscles LUE Sensation: history of peripheral neuropathy    Lower Extremity Assessment Lower Extremity Assessment: RLE deficits/detail;LLE deficits/detail RLE Deficits / Details: grossly functional strenght and ROM RLE Sensation: history of peripheral neuropathy LLE Deficits / Details: LLE flaccid, one instance of knee extensor spasm noted LLE Sensation: history of peripheral neuropathy    Cervical / Trunk Assessment Cervical / Trunk Assessment: Normal  Communication   Communication: No difficulties  Cognition Arousal/Alertness: Awake/alert Behavior During Therapy: WFL for tasks assessed/performed Overall Cognitive Status: Impaired/Different from baseline Area of Impairment: Awareness                           Awareness: Emergent   General Comments: pt with impaired awareness of balance deficits, unable to correct at this time        General Comments General comments (skin integrity, edema, etc.): pt with hypertension, systolic BP ranging from 160-190 during session, RN aware    Exercises     Assessment/Plan    PT Assessment Patient needs continued PT services  PT Problem List Decreased strength;Decreased activity tolerance;Decreased balance;Decreased mobility;Decreased cognition;Decreased knowledge of use of  DME;Decreased safety awareness;Decreased knowledge of precautions;Impaired sensation       PT Treatment Interventions DME instruction;Gait training;Stair training;Functional mobility training;Therapeutic activities;Therapeutic exercise;Balance training;Neuromuscular re-education;Patient/family education;Cognitive remediation    PT Goals (Current goals can be found in the Care Plan section)  Acute Rehab PT Goals Patient Stated Goal: to improve strength and reduce falls risk PT Goal Formulation: With patient Time For Goal Achievement: 08/27/21 Potential to Achieve Goals: Fair    Frequency Min 4X/week     Co-evaluation PT/OT/SLP Co-Evaluation/Treatment: Yes Reason for Co-Treatment: Complexity of the patient's impairments (multi-system involvement);Necessary to address cognition/behavior during functional activity;For patient/therapist safety;To address functional/ADL transfers PT goals addressed during session: Mobility/safety with mobility;Balance;Strengthening/ROM         AM-PAC PT "6 Clicks" Mobility  Outcome Measure Help needed turning from your back to your side while in a flat bed without using bedrails?: A Lot Help needed moving from lying on your back to sitting on the side of a flat bed without using bedrails?: Total Help needed moving to and from a bed to a chair (including a wheelchair)?: A Lot Help needed standing up from a chair using your arms (e.g., wheelchair or bedside chair)?: A Lot Help needed to walk in hospital room?: Total Help needed climbing 3-5 steps with a railing? : Total 6 Click Score: 9    End of Session Equipment Utilized During Treatment: Gait belt Activity Tolerance: Patient tolerated treatment well Patient left: in bed;with call bell/phone within reach;with bed alarm set  Nurse Communication: Mobility status;Need for lift equipment PT Visit Diagnosis: Other abnormalities of gait and mobility (R26.89);Hemiplegia and hemiparesis Hemiplegia -  Right/Left: Left Hemiplegia - dominant/non-dominant: Non-dominant Hemiplegia - caused by: Unspecified (no MRI at this point)    Time: 1335-1415 PT Time Calculation (min) (ACUTE ONLY): 40 min   Charges:   PT Evaluation $PT Eval Moderate Complexity: 1 Mod PT Treatments $Therapeutic Activity: 8-22 mins        Arlyss Gandy, PT, DPT Acute Rehabilitation Pager: [phone] Office [phone]   Arlyss Gandy 08/13/2021, 2:35 PM

## 2021-08-13 NOTE — Progress Notes (Signed)
  Transition of Care [facility]) Screening Note   Patient Details  Name: Steve Alvarado Date of Birth: Oct 15, 1955   Transition [facility]) CM/SW Contact:    Mearl Latin, LCSW Phone Number: 08/13/2021, 3:04 PM    Transition of Care Department [facility]) has reviewed patient and patient came to the hospital from [facility]. We will continue to monitor patient advancement through interdisciplinary progression rounds. If new patient transition needs arise, please place a TOC consult.

## 2021-08-14 DIAGNOSIS — E11649 Type 2 diabetes mellitus with hypoglycemia without coma: Secondary | ICD-10-CM | POA: Diagnosis not present

## 2021-08-14 DIAGNOSIS — F1411 Cocaine abuse, in remission: Secondary | ICD-10-CM

## 2021-08-14 DIAGNOSIS — E7800 Pure hypercholesterolemia, unspecified: Secondary | ICD-10-CM

## 2021-08-14 DIAGNOSIS — I10 Essential (primary) hypertension: Secondary | ICD-10-CM | POA: Diagnosis not present

## 2021-08-14 DIAGNOSIS — I63311 Cerebral infarction due to thrombosis of right middle cerebral artery: Secondary | ICD-10-CM | POA: Diagnosis not present

## 2021-08-14 LAB — BASIC METABOLIC PANEL
Anion gap: 8 (ref 5–15)
BUN: 14 mg/dL (ref 8–23)
CO2: 22 mmol/L (ref 22–32)
Calcium: 9.4 mg/dL (ref 8.9–10.3)
Chloride: 107 mmol/L (ref 98–111)
Creatinine, Ser: 0.97 mg/dL (ref 0.61–1.24)
GFR, Estimated: >60 mL/min (ref >60)
Glucose, Bld: 122 mg/dL — ABNORMAL HIGH (ref 70–99)
Potassium: 4 mmol/L (ref 3.5–5.1)
Sodium: 137 mmol/L (ref 135–145)

## 2021-08-14 LAB — GLUCOSE, CAPILLARY
Glucose-Capillary: 128 mg/dL — ABNORMAL HIGH (ref 70–99)
Glucose-Capillary: 170 mg/dL — ABNORMAL HIGH (ref 70–99)
Glucose-Capillary: 158 mg/dL — ABNORMAL HIGH (ref 70–99)
Glucose-Capillary: 134 mg/dL — ABNORMAL HIGH (ref 70–99)

## 2021-08-14 MED ORDER — CARVEDILOL 3.125 MG PO TABS
3.1250 mg | ORAL_TABLET | Freq: Two times a day (BID) | ORAL | Status: DC
  Administered 2021-08-14 – 2021-08-18 (×8): 3.125 mg via ORAL
  Filled 2021-08-14 (×8): qty 1

## 2021-08-14 MED ORDER — LEVOTHYROXINE SODIUM 75 MCG PO TABS
75.0000 ug | ORAL_TABLET | Freq: Every day | ORAL | Status: DC
  Administered 2021-08-14 – 2021-08-17 (×4): 75 ug via ORAL
  Filled 2021-08-14 (×4): qty 1

## 2021-08-14 MED ORDER — CLOPIDOGREL BISULFATE 75 MG PO TABS
75.0000 mg | ORAL_TABLET | Freq: Every day | ORAL | Status: DC
  Administered 2021-08-14 – 2021-08-18 (×5): 75 mg via ORAL
  Filled 2021-08-14 (×5): qty 1

## 2021-08-14 MED ORDER — LOSARTAN POTASSIUM 50 MG PO TABS
50.0000 mg | ORAL_TABLET | Freq: Every morning | ORAL | Status: DC
  Administered 2021-08-14 – 2021-08-18 (×5): 50 mg via ORAL
  Filled 2021-08-14 (×5): qty 1

## 2021-08-14 MED ORDER — ENOXAPARIN SODIUM 40 MG/0.4ML IJ SOSY
40.0000 mg | PREFILLED_SYRINGE | INTRAMUSCULAR | Status: DC
  Administered 2021-08-14 – 2021-08-17 (×4): 40 mg via SUBCUTANEOUS
  Filled 2021-08-14 (×4): qty 0.4

## 2021-08-14 MED ORDER — PANTOPRAZOLE SODIUM 40 MG PO TBEC
40.0000 mg | DELAYED_RELEASE_TABLET | Freq: Every day | ORAL | Status: DC
  Administered 2021-08-14 – 2021-08-18 (×5): 40 mg via ORAL
  Filled 2021-08-14 (×5): qty 1

## 2021-08-14 MED ORDER — CITALOPRAM HYDROBROMIDE 20 MG PO TABS
20.0000 mg | ORAL_TABLET | Freq: Every day | ORAL | Status: DC
  Administered 2021-08-14 – 2021-08-18 (×5): 20 mg via ORAL
  Filled 2021-08-14: qty 2
  Filled 2021-08-14 (×4): qty 1

## 2021-08-14 MED ORDER — ASPIRIN 81 MG PO TBEC
81.0000 mg | DELAYED_RELEASE_TABLET | Freq: Every day | ORAL | Status: DC
  Administered 2021-08-14 – 2021-08-18 (×5): 81 mg via ORAL
  Filled 2021-08-14 (×5): qty 1

## 2021-08-14 NOTE — Evaluation (Signed)
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   Uncontrolled type 2 diabetes mellitus with hyperglycemia, with long-term current use of insulin (HCC) 07/09/2021   Hypothyroidism 07/09/2021   AKI (acute kidney injury) (HCC) 07/08/2021   Chest pain 10/21/2020   Depression 10/21/2020   Non compliance w medication regimen 10/21/2020   Cocaine abuse (HCC) 09/05/2020   Adjustment disorder with emotional disturbance 09/05/2020   Hypertensive urgency 03/08/2015   Essential hypertension 02/14/2011   CAD (coronary artery disease) 08/13/2010   Stented coronary artery 08/13/2010   Diabetes mellitus (HCC) 08/13/2010   Past Medical History:  Past Medical History:  Diagnosis Date   Coronary artery disease    Diabetes mellitus    Type II   Hyperlipidemia    Hypertension    Thyroid disease    Past Surgical History:  Past Surgical History:  Procedure Laterality Date   CARDIAC CATHETERIZATION  08/06/2010   Bare metal stent placed in RCA.   HAND SURGERY     left   HPI:  Patient is a 66 yo M who presented to ED with acute onset left sided weakness resulting in a fall and left facial droop.  CT reveals no acute intracranial process, MRI revealed Acute nonhemorrhagic infarct involving the posterior right  lentiform nucleus and corona radiata measure. TNK was given in the ED to treat his stroke.  Pt states his deficits have not improved. Pt with past medical history of CAD, HTN, HLD, DM and thyroid disease.   Assessment / Plan / Recommendation Clinical Impression  Pt presents with cognitive communication deficits and dysarthria post CVA. Portions of [employer] Mental Status Examination (SLUMS) utlized in order to assess cognitive  fucntions. He is oriented x4 and with good immediate memory (5/5). He demonstrates difficulties with executive functions/problem solving with mathematical calculation (1/3), generative naming (2/3) and delayed list recall (3/5, 5/5 with semantic cues). Throughout session, he did demonstrate awareness of safety risks if he were to get out of bed by himself. Speech is mildly dysarthric, unintelligible at the phrase-sentence level. With cues for overarticulation and slow rate of speech, intelligibility improved. Provided further education regarding increase of vocal intensity and pausing to also improve speech intelligibility. Pt expressed understanding and agreement with given strategies. SLP services warranted to f/u for treatment of cognitive functions and motor speech.    SLP Assessment  SLP Recommendation/Assessment: Patient needs continued Speech Lanaguage Pathology Services SLP Visit Diagnosis: Dysarthria and anarthria (R47.1);Cognitive communication deficit (R41.841)    Recommendations for follow up therapy are one component of a multi-disciplinary discharge planning process, led by the attending physician.  Recommendations may be updated based on patient status, additional functional criteria and insurance authorization.    Follow Up Recommendations  Acute inpatient rehab (3hours/day)    Assistance Recommended at Discharge  Intermittent Supervision/Assistance  Functional Status Assessment Patient has had a recent decline in their functional status and demonstrates the ability to make significant improvements in function in a reasonable and predictable amount of time.  Frequency and Duration min 2x/week  2 weeks      SLP Evaluation Cognition  Overall Cognitive Status: Impaired/Different from baseline Arousal/Alertness: Awake/alert Orientation Level: Oriented X4 Year: 2023 Month: May Day of Week: Correct Attention: Sustained Sustained Attention: Appears intact Memory: Impaired Memory  Impairment: Storage deficit;Retrieval deficit;Decreased recall of new information Awareness: Appears intact Executive Function: Reasoning;Decision Making Reasoning: Impaired Reasoning Impairment: Functional basic;Verbal basic Decision Making: Impaired Decision Making Impairment: Verbal basic;Functional basic Safety/Judgment: Appears intact       Comprehension  Auditory Comprehension Overall Auditory Comprehension: Appears within functional limits for tasks assessed Visual Recognition/Discrimination Discrimination: Not tested Reading Comprehension Reading Status: Not tested    Expression Expression Primary Mode of Expression: Verbal Verbal Expression Overall Verbal Expression: Appears within functional limits for tasks assessed Initiation: No impairment Automatic Speech: Name;Social Response;Day of week;Month of year Level of Generative/Spontaneous Verbalization: Conversation Naming: No impairment Written Expression Dominant Hand: Right Written Expression: Not tested   Oral / Motor  Oral Motor/Sensory Function Overall Oral Motor/Sensory Function: Moderate impairment Facial ROM: Reduced left Facial Symmetry: Abnormal symmetry left Lingual ROM: Within Functional Limits Lingual Symmetry: Within Functional Limits Lingual Strength: Reduced Velum: Within Functional Limits Mandible: Within Functional Limits Motor Speech Overall Motor Speech: Impaired Respiration: Within functional limits Phonation: Normal Resonance: Within functional limits Articulation: Impaired Level of Impairment: Phrase Intelligibility: Intelligibility reduced Word: 75-100% accurate Phrase: 50-74% accurate Sentence: 50-74% accurate Conversation: 50-74% accurate Motor Planning: Witnin functional limits Motor Speech Errors: Unaware;Inconsistent Effective Techniques: Over-articulate;Slow rate;Increased vocal intensity             Avie Echevaria, MA, CCC-SLP Acute Rehabilitation Services Office  Number: [phone]  Paulette Blanch 08/14/2021, 10:42 AM

## 2021-08-14 NOTE — Progress Notes (Signed)
Speech Language Pathology Treatment: Dysphagia  Patient Details Name: Steve Alvarado MRN: [id number] DOB: 10-Feb-1956 Today's Date: 08/14/2021 Time: 3419-3790 SLP Time Calculation (min) (ACUTE ONLY): 11 min  Assessment / Plan / Recommendation Clinical Impression  Pt with baseline congested cough this am prior to POs. Both pt and RN report tolerance of regular diet with thin liquids and meds whole with water. X1 overt cough exhibited post sip of water, otherwise no overt s/sx of aspiration across multiple attempts. Pt self-fed mechanical soft and regular textures demonstrating minimal residuals in L buccal cavity, which he was  intermittently unaware of. With min verbal/visual cues for lingual sweep and liquid wash, full oral clearance achieved. As trials continued, pt with increased independence with use of lingual sweep and liquid wash to clear minimal pocketing of POs. Last trial, pt with min solid residual on L side stuck partially underneath bottom denture, which he was attempting to clear. Educated pt regarding use of digit sweep as another means of orally clearing residue that cannot be cleared by other means. Pt expressed understanding/agreement and recalled all three strategies for oral clearance of POs after a brief and lengthy delay. Recommend continue regular diet with thin liquids. Will f/u for continued tolerance and independence with use of strategies.    HPI HPI: Patient is a 66 yo M who presented to ED with acute onset left sided weakness resulting in a fall and left facial droop.  CT reveals no acute intracranial process, MRI revealed Acute nonhemorrhagic infarct involving the posterior right  lentiform nucleus and corona radiata measure. TNK was given in the ED to treat his stroke.  Pt states his deficits have not improved. Pt with past medical history of CAD, HTN, HLD, DM and thyroid disease.      SLP Plan  Goals updated      Recommendations for follow up therapy are one  component of a multi-disciplinary discharge planning process, led by the attending physician.  Recommendations may be updated based on patient status, additional functional criteria and insurance authorization.    Recommendations  Diet recommendations: Regular;Thin liquid Liquids provided via: Straw;Cup Medication Administration: Whole meds with liquid Supervision: Patient able to self feed Compensations: Slow rate;Small sips/bites;Follow solids with liquid;Lingual sweep for clearance of pocketing (L side) Postural Changes and/or Swallow Maneuvers: Seated upright 90 degrees                Oral Care Recommendations: Oral care BID Follow Up Recommendations: Acute inpatient rehab (3hours/day) Assistance recommended at discharge: Intermittent Supervision/Assistance SLP Visit Diagnosis: Dysphagia, oral phase (R13.11) Plan: Goals updated           Avie Echevaria, MA, CCC-SLP Acute Rehabilitation Services Office Number: [phone]   Paulette Blanch  08/14/2021, 10:18 AM

## 2021-08-14 NOTE — Progress Notes (Addendum)
STROKE TEAM PROGRESS NOTE   INTERVAL HISTORY Patient is seen in his room with his brother at the bedside.  HE has been hemodynamically stable overnight and his neurological exam is stable.  He is ready to transfer out of the ICU and will likely go to CIR in a few days. Vitals:   08/14/21 0900 08/14/21 1000 08/14/21 1100 08/14/21 1200  BP: (!) 180/90 (!) 179/92 (!) 142/91 (!) 164/84  Pulse: 62 64 67 65  Resp: 14 13 15  (!) 7  Temp:      TempSrc:      SpO2: 98% 98% 99% 96%  Weight:      Height:       CBC:  Recent Labs  Lab 08/12/21 1149 08/12/21 1157  WBC 5.3  --   NEUTROABS 2.5  --   HGB 10.9* 10.2*  HCT 32.3* 30.0*  MCV 90.0  --   PLT 218  --     Basic Metabolic Panel:  Recent Labs  Lab 08/12/21 1149 08/12/21 1157 08/12/21 1502 08/14/21 0325  NA 138 138  --  137  K 5.0 4.8  --  4.0  CL 112* 111  --  107  CO2 19*  --   --  22  GLUCOSE 164* 164*  --  122*  BUN 33* 33*  --  14  CREATININE 1.59* 1.70*  --  0.97  CALCIUM 8.9  --   --  9.4  MG  --   --  1.6*  --   PHOS  --   --  3.3  --     Lipid Panel:  Recent Labs  Lab 08/13/21 0544  CHOL 146  TRIG 99  HDL 66  CHOLHDL 2.2  VLDL 20  LDLCALC 60    HgbA1c:  Recent Labs  Lab 08/13/21 0544  HGBA1C 7.9*    Urine Drug Screen:  Recent Labs  Lab 08/13/21 0924  LABOPIA NONE DETECTED  COCAINSCRNUR NONE DETECTED  LABBENZ NONE DETECTED  AMPHETMU NONE DETECTED  THCU NONE DETECTED  LABBARB NONE DETECTED     Alcohol Level No results for input(s): ETH in the last 168 hours.  IMAGING past 24 hours MR BRAIN WO CONTRAST  Result Date: 08/13/2021 CLINICAL DATA:  Stroke, follow-up. Status post TNK. Left-sided weakness. Slurred speech. EXAM: MRI HEAD WITHOUT CONTRAST TECHNIQUE: Multiplanar, multiecho pulse sequences of the brain and surrounding structures were obtained without intravenous contrast. COMPARISON:  None Available. FINDINGS: Brain: Diffusion-weighted images demonstrate no acute nonhemorrhagic infarct  involving the posterior right lentiform nucleus and corona radiata. The maximum AP dimension is 3.4 cm. T2 signal changes are associated with the area of acute infarction. Generalized atrophy and periventricular white matter changes are otherwise moderately advanced for age. Diffuse white matter changes extend into the brainstem. Remote lacunar infarcts are present the posterior cerebellum bilaterally. A remote lacunar infarct is also present within the left thalamus. The ventricles are proportionate to the degree of atrophy. No acute hemorrhage or mass lesion is present. No significant extraaxial fluid collection is present. The internal auditory canals are within normal limits. Vascular: Flow is present in the major intracranial arteries. Skull and upper cervical spine: The craniocervical junction is normal. Upper cervical spine is within normal limits. Marrow signal is unremarkable. Sinuses/Orbits: Fluid is present in the inferior mastoid air cells bilaterally. No obstructing nasopharyngeal lesion is present. The paranasal sinuses and mastoid air cells are otherwise clear. The globes and orbits are within normal limits. IMPRESSION: 1. Acute nonhemorrhagic infarct involving  the posterior right lentiform nucleus and corona radiata measure. 2. Generalized atrophy and white matter disease is moderately advanced for age. This likely reflects the sequela of chronic microvascular ischemia. 3. Remote lacunar infarcts of the posterior cerebellum bilaterally and left thalamus. Electronically Signed   By: San Morelle M.D.   On: 08/13/2021 16:57    PHYSICAL EXAM General:  Alert, well-developed, well-nourished middle-aged African-American male in no acute distress Respiratory:  Regular, unlabored respirations on room air  NEURO:  Mental Status: AA&Ox3  Speech/Language: speech has mild dysarthria  Cranial Nerves:  II: PERRL. Visual fields full.  III, IV, VI: EOMI. Eyelids elevate symmetrically.  V:  Sensation is intact to light touch and symmetrical to face.  VII: Moderate left lower facial weakness and mild left eye closure weakness.  VIII: hearing intact to voice. IX, X: Phonation is normal.  XII: tongue is midline without fasciculations. Motor: 5/5 strength to RUE and RLE, 0/5 to LUE and 2/5 to LLE.   Tone is increased on the left sideand bulk is normal Sensation- Intact to light touch bilaterally.  Coordination: FTN intact on right Gait- deferred   ASSESSMENT/PLAN [name] is a 66 y.o. male with history of CAD s/p stenting, HTN, HLD, DM and cocaine abuse presenting with acute onset left sided weakness resulting in a fall and left facial droop.  CT reveals no acute intracranial process, MRI pending.  TNK was given in the ED to treat his stroke.    Stroke:  right subcortical infarct likely secondary due to small vessel disease, however cardioembolic source cannot be completed without given relatively larger size telemetry coronary infarct CT head No acute abnormality. Small vessel disease. Atrophy. ASPECTS 10.    CTA head & neck negative for LVO, moderate bilateral distal vertebral artery stenosis, right more than left MRI  acute infarct in posterior right lentiform nucleus and corona radiata 2D Echo EF 123456, grade 1 diastolic dysfunction, mildly dilated left atrium, no atrial level shunt Recommend 30-day Cardiac monitoring as outpatient to rule out A-fib. LDL 60 HgbA1c 7.9 UDS negative this admission VTE prophylaxis -Lovenox No antithrombotic prior to admission, now on aspirin 81 mg daily and clopidogrel 75 mg daily DAPT for 3 weeks and then aspirin only. Therapy recommendations:  CIR Disposition:  pending  Hypertension Home meds:  amlodipine 5 mg daily, losartan 50 mg daily Stable Keep BP <180/105 Long-term BP goal normotensive  Hyperlipidemia Home meds:  atorvastatin 40 mg daily, resumed in hospital LDL 60, goal < 70 Continue statin at  discharge  Diabetes type II Uncontrolled Home meds:  metformin 1000 mg daily, insulin glargine 30 units daily, insulin lispro 3 units TID with meals HgbA1c 7.9, goal < 7.0 Now on insulin CBGs SSI Close PCP follow-up for better DM control  Other Stroke Risk Factors Advanced Age >/= 65  Coronary artery disease status post stenting History of cocaine abuse, last cocaine positive 10/2020  Other Active Problems none  Hospital day # [place] , MSN, AGACNP-BC [facility] See Amion for schedule and pager information 08/14/2021 12:29 PM  ATTENDING NOTE: I reviewed above note and agree with the assessment and plan. Pt was seen and examined.   66 year old male with history of CAD status post stenting, hypertension, hyperlipidemia, diabetes, cocaine abuse admitted for left-sided weakness, left facial droop and a fall.  CT no acute abnormality.  Status post TNK.  CT head and neck bilateral VA distal stenosis, right more than left.  MRI showed right external capsule and CR infarct, old left thalamus and bilateral cerebellum infarct.  EF 60 to 65%.  LDL 60, A1c 7.9, UDS negative.  Creatinine 0.97.  On exam, brother at bedside, patient awake, alert, eyes open, orientated to age, place, time and people. No aphasia, fluent language, mild dysarthria, following all simple commands. Able to name and repeat. No gaze palsy, tracking bilaterally, visual field full, PERRL. Left facial droop. Tongue midline. LUE flaccid. LLE 1/5. RUE and RLE 5/5. Sensation symmetrical bilaterally, right FTN intact, gait not tested.    NIH Stroke Scale         Level Of Consciousness 0=Alert; keenly responsive 1=Arouse to minor stimulation 2=Requires repeated stimulation to arouse or movements to pain 3=postures or unresponsive 0  LOC Questions to Month and Age 32=Answers both questions correctly 1=Answers one question correctly or dysarthria/intubated/trauma/language barrier 2=Answers neither  question correctly or aphasia 0  LOC Commands      -Open/Close eyes     -Open/close grip     -Pantomime commands if communication barrier 0=Performs both tasks correctly 1=Performs one task correctly 2=Performs neighter task correctly 0  Best Gaze     -Only assess horizontal gaze 0=Normal 1=Partial gaze palsy 2=Forced deviation, or total gaze paresis 0  Visual 0=No visual loss 1=Partial hemianopia 2=Complete hemianopia 3=Bilateral hemianopia (blind including cortical blindness) 0  Facial Palsy     -Use grimace if obtunded 0=Normal symmetrical movement 1=Minor paralysis (asymmetry) 2=Partial paralysis (lower face) 3=Complete paralysis (upper and lower face) 2  Motor   0=No drift for 10/5 seconds 1=Drift, but does not hit bed 2=Some antigravity effort, hits  bed 3=No effort against gravity, limb falls 4=No movement 0=Amputation/joint fusion Right Arm 0     Leg 0    Left Arm 4     Leg 3  Limb Ataxia     - FNT/HTS 0=Absent or does not understand or paralyzed or amputation/joint fusion 1=Present in one limb 2=Present in two limbs 0  Sensory 0=Normal 1=Mild to moderate sensory loss 2=Severe to total sensory loss or coma/unresponsive 0  Best Language 0=No aphasia, normal 1=Mild to moderate aphasia 2=Severe aphasia 3=Mute, global aphasia, or coma/unresponsive 0  Dysarthria 0=Normal 1=Mild to moderate 2=Severe, unintelligible or mute/anarthric 0=intubated/unable to test 1  Extinction/Neglect 0=No abnormality 1=visual/tactile/auditory/spatia/personal inattention/Extinction to bilateral simultaneous stimulation 2=Profound neglect/extinction more than 1 modality  0  Total     10    Etiology for patient stroke likely small vessel disease given multiple uncontrolled risk factors.  However cardioembolic source cannot be completed without given relatively larger size than lacune.  Recommend 30-day CardioNet monitoring as outpatient.  Currently on aspirin and Plavix DAPT for 3 weeks  and then aspirin alone.  Continue Lipitor 40.  Aggressive risk factor modification.  PT/OT recommend CIR.  For detailed assessment and plan, please refer to above as I have made changes wherever appropriate.   Rosalin Hawking, MD PhD Stroke Neurology 08/14/2021 5:00 PM  This patient is critically ill due to right brain stroke status post TNK, uncontrolled diabetes and at significant risk of neurological worsening, death form recurrent stroke, bleeding from TNK, hyperglycemia with DKA. This patient's care requires constant monitoring of vital signs, hemodynamics, respiratory and cardiac monitoring, review of multiple databases, neurological assessment, discussion with family, other specialists and medical decision making of high complexity. I spent 35 minutes of neurocritical care time in the care of this patient. I had long discussion with patient and brother at bedside, updated pt current condition,  treatment plan and potential prognosis, and answered all the questions.  They expressed understanding and appreciation.      To contact Stroke Continuity provider, please refer to [email or web address] After hours, contact General Neurology

## 2021-08-15 DIAGNOSIS — I63311 Cerebral infarction due to thrombosis of right middle cerebral artery: Secondary | ICD-10-CM | POA: Diagnosis not present

## 2021-08-15 LAB — BASIC METABOLIC PANEL
Anion gap: 6 (ref 5–15)
BUN: 19 mg/dL (ref 8–23)
CO2: 24 mmol/L (ref 22–32)
Calcium: 9.4 mg/dL (ref 8.9–10.3)
Chloride: 108 mmol/L (ref 98–111)
Creatinine, Ser: 1.11 mg/dL (ref 0.61–1.24)
GFR, Estimated: >60 mL/min (ref >60)
Glucose, Bld: 146 mg/dL — ABNORMAL HIGH (ref 70–99)
Potassium: 4.1 mmol/L (ref 3.5–5.1)
Sodium: 138 mmol/L (ref 135–145)

## 2021-08-15 LAB — CBC
HCT: 32.4 % — ABNORMAL LOW (ref 39.0–52.0)
Hemoglobin: 11 g/dL — ABNORMAL LOW (ref 13.0–17.0)
MCH: 29.3 pg (ref 26.0–34.0)
MCHC: 34 g/dL (ref 30.0–36.0)
MCV: 86.2 fL (ref 80.0–100.0)
Platelets: 229 10*3/uL (ref 150–400)
RBC: 3.76 MIL/uL — ABNORMAL LOW (ref 4.22–5.81)
RDW: 12 % (ref 11.5–15.5)
WBC: 6.2 10*3/uL (ref 4.0–10.5)
nRBC: 0 % (ref 0.0–0.2)

## 2021-08-15 LAB — GLUCOSE, CAPILLARY
Glucose-Capillary: 125 mg/dL — ABNORMAL HIGH (ref 70–99)
Glucose-Capillary: 137 mg/dL — ABNORMAL HIGH (ref 70–99)
Glucose-Capillary: 111 mg/dL — ABNORMAL HIGH (ref 70–99)
Glucose-Capillary: 102 mg/dL — ABNORMAL HIGH (ref 70–99)

## 2021-08-15 MED ORDER — AMLODIPINE BESYLATE 10 MG PO TABS
10.0000 mg | ORAL_TABLET | Freq: Every morning | ORAL | Status: DC
  Administered 2021-08-15 – 2021-08-18 (×4): 10 mg via ORAL
  Filled 2021-08-15 (×4): qty 1

## 2021-08-15 MED ORDER — MELATONIN 3 MG PO TABS: 3.0000 mg | ORAL_TABLET | Freq: Once | ORAL | Status: DC

## 2021-08-15 NOTE — Progress Notes (Addendum)
STROKE TEAM PROGRESS NOTE   INTERVAL HISTORY Patient is seen in his room with no family at the bedside.  He has been hemodynamically stable overnight and his neurological exam is stable.  He has transferred out of the ICU and will likely go to CIR in a few days. Vitals:   08/14/21 1613 08/14/21 2024 08/15/21 0002 08/15/21 0819  BP: (!) 173/83 136/82 114/68 (!) 148/76  Pulse: 67 68 72 72  Resp: 18 12 15 12   Temp: 98.2 F (36.8 C) 98.4 F (36.9 C) 98.6 F (37 C) 98.1 F (36.7 C)  TempSrc: Oral Oral Oral Oral  SpO2: 99% 96% 98%   Weight:      Height:       CBC:  Recent Labs  Lab 08/12/21 1149 08/12/21 1157 08/15/21 0125  WBC 5.3  --  6.2  NEUTROABS 2.5  --   --   HGB 10.9* 10.2* 11.0*  HCT 32.3* 30.0* 32.4*  MCV 90.0  --  86.2  PLT 218  --  [id number]    Basic Metabolic Panel:  Recent Labs  Lab 08/12/21 1502 08/14/21 0325 08/15/21 0125  NA  --  137 138  K  --  4.0 4.1  CL  --  107 108  CO2  --  22 24  GLUCOSE  --  122* 146*  BUN  --  14 19  CREATININE  --  0.97 1.11  CALCIUM  --  9.4 9.4  MG 1.6*  --   --   PHOS 3.3  --   --     Lipid Panel:  Recent Labs  Lab 08/13/21 0544  CHOL 146  TRIG 99  HDL 66  CHOLHDL 2.2  VLDL 20  LDLCALC 60    HgbA1c:  Recent Labs  Lab 08/13/21 0544  HGBA1C 7.9*    Urine Drug Screen:  Recent Labs  Lab 08/13/21 0924  LABOPIA NONE DETECTED  COCAINSCRNUR NONE DETECTED  LABBENZ NONE DETECTED  AMPHETMU NONE DETECTED  THCU NONE DETECTED  LABBARB NONE DETECTED     Alcohol Level No results for input(s): ETH in the last 168 hours.  IMAGING past 24 hours No results found.  PHYSICAL EXAM General:  Alert, well-developed, well-nourished middle-aged African-American male in no acute distress Respiratory:  Regular, unlabored respirations on room air  NEURO:  Mental Status: AA&Ox3  Speech/Language: speech has mild dysarthria  Cranial Nerves:  II: PERRL. Visual fields full.  III, IV, VI: EOMI. Eyelids elevate  symmetrically.  V: Sensation is intact to light touch and symmetrical to face.  VII: Moderate left lower facial weakness and mild left eye closure weakness.  VIII: hearing intact to voice. IX, X: Phonation is normal.  XII: tongue is midline without fasciculations. Motor: 5/5 strength to RUE and RLE, 1/5 to LUE and 2/5 to LLE.   Tone is increased on the left sideand bulk is normal Sensation- Intact to light touch bilaterally.  Coordination: FTN intact on right Gait- deferred   ASSESSMENT/PLAN [name] is a 66 y.o. male with history of CAD s/p stenting, HTN, HLD, DM and cocaine abuse presenting with acute onset left sided weakness resulting in a fall and left facial droop.  CT reveals no acute intracranial process, MRI pending.  TNK was given in the ED to treat his stroke.  Symptoms did not improve much after TNK, and patient is awaiting transfer to CIR.  Stroke:  right subcortical infarct likely secondary due to small vessel disease, however cardioembolic source  cannot be completed without given relatively larger size infarct CT head No acute abnormality. Small vessel disease. Atrophy. ASPECTS 10.    CTA head & neck negative for LVO, moderate bilateral distal vertebral artery stenosis, right more than left MRI  acute infarct in posterior right lentiform nucleus and corona radiata 2D Echo EF 123456, grade 1 diastolic dysfunction, mildly dilated left atrium, no atrial level shunt Recommend 30-day Cardiac monitoring as outpatient to rule out A-fib. LDL 60 HgbA1c 7.9 UDS negative this admission VTE prophylaxis -Lovenox No antithrombotic prior to admission, now on aspirin 81 mg daily and clopidogrel 75 mg daily DAPT for 3 weeks and then aspirin only. Therapy recommendations:  CIR Disposition:  pending  Hypertension Home meds:  amlodipine 5 mg daily, losartan 50 mg daily, Coreg 3.125 twice daily, isosorbide 30 mg Stable on the high end Now on coreg 3.125, losartan 50 Add  amlodipine 10 Long-term BP goal normotensive  Hyperlipidemia Home meds:  atorvastatin 40 mg daily, resumed in hospital LDL 60, goal < 70 Continue statin at discharge  Diabetes type II Uncontrolled Home meds:  metformin 1000 mg daily, insulin glargine 30 units daily, insulin lispro 3 units TID with meals HgbA1c 7.9, goal < 7.0 Now on insulin CBGs SSI Close PCP follow-up for better DM control  Other Stroke Risk Factors Advanced Age >/= 65  Coronary artery disease status post stenting History of cocaine abuse, last cocaine positive 10/2020  Other Active Problems none  Hospital day # [place] , MSN, AGACNP-BC [facility] See Amion for schedule and pager information 08/15/2021 11:55 AM  ATTENDING NOTE: I reviewed above note and agree with the assessment and plan. Pt was seen and examined.   No family at bedside.  Patient lying in bed, no acute event overnight.  Neuro stable.  Currently on DAPT and statin.  PT/OT recommend CIR.  Pending CIR placement.  Left stable, however BP still on the higher end, continue Coreg and losartan, add amlodipine 10.  For detailed assessment and plan, please refer to above as I have made changes wherever appropriate.   Rosalin Hawking, MD PhD Stroke Neurology 08/15/2021 5:29 PM    To contact Stroke Continuity provider, please refer to [email or web address] After hours, contact General Neurology

## 2021-08-15 NOTE — Plan of Care (Signed)
  Problem: Education: Goal: Knowledge of disease or condition will improve Outcome: Progressing Goal: Knowledge of secondary prevention will improve (SELECT ALL) Outcome: Progressing Goal: Knowledge of patient specific risk factors will improve (INDIVIDUALIZE FOR PATIENT) Outcome: Progressing Goal: Individualized Educational Video(s) Outcome: Progressing   Problem: Coping: Goal: Will verbalize positive feelings about self Outcome: Progressing Goal: Will identify appropriate support needs Outcome: Progressing   Problem: Health Behavior/Discharge Planning: Goal: Ability to manage health-related needs will improve Outcome: Progressing   Problem: Nutrition: Goal: Risk of aspiration will decrease Outcome: Progressing

## 2021-08-15 NOTE — Plan of Care (Signed)
  Problem: Education: Goal: Knowledge of disease or condition will improve Outcome: Progressing Goal: Knowledge of secondary prevention will improve (SELECT ALL) Outcome: Progressing Goal: Knowledge of patient specific risk factors will improve (INDIVIDUALIZE FOR PATIENT) Outcome: Progressing Goal: Individualized Educational Video(s) Outcome: Progressing   Problem: Coping: Goal: Will verbalize positive feelings about self Outcome: Progressing Goal: Will identify appropriate support needs Outcome: Progressing   Problem: Health Behavior/Discharge Planning: Goal: Ability to manage health-related needs will improve Outcome: Progressing   Problem: Nutrition: Goal: Risk of aspiration will decrease Outcome: Progressing   Problem: Ischemic Stroke/TIA Tissue Perfusion: Goal: Complications of ischemic stroke/TIA will be minimized Outcome: Progressing

## 2021-08-16 DIAGNOSIS — I63311 Cerebral infarction due to thrombosis of right middle cerebral artery: Secondary | ICD-10-CM | POA: Diagnosis not present

## 2021-08-16 LAB — BASIC METABOLIC PANEL
Anion gap: 4 — ABNORMAL LOW (ref 5–15)
BUN: 19 mg/dL (ref 8–23)
CO2: 24 mmol/L (ref 22–32)
Calcium: 9.2 mg/dL (ref 8.9–10.3)
Chloride: 109 mmol/L (ref 98–111)
Creatinine, Ser: 1.09 mg/dL (ref 0.61–1.24)
GFR, Estimated: >60 mL/min (ref >60)
Glucose, Bld: 102 mg/dL — ABNORMAL HIGH (ref 70–99)
Potassium: 3.8 mmol/L (ref 3.5–5.1)
Sodium: 137 mmol/L (ref 135–145)

## 2021-08-16 LAB — CBC
HCT: 32.3 % — ABNORMAL LOW (ref 39.0–52.0)
Hemoglobin: 11.1 g/dL — ABNORMAL LOW (ref 13.0–17.0)
MCH: 29.6 pg (ref 26.0–34.0)
MCHC: 34.4 g/dL (ref 30.0–36.0)
MCV: 86.1 fL (ref 80.0–100.0)
Platelets: 206 10*3/uL (ref 150–400)
RBC: 3.75 MIL/uL — ABNORMAL LOW (ref 4.22–5.81)
RDW: 12 % (ref 11.5–15.5)
WBC: 6.3 10*3/uL (ref 4.0–10.5)
nRBC: 0 % (ref 0.0–0.2)

## 2021-08-16 LAB — GLUCOSE, CAPILLARY
Glucose-Capillary: 107 mg/dL — ABNORMAL HIGH (ref 70–99)
Glucose-Capillary: 124 mg/dL — ABNORMAL HIGH (ref 70–99)
Glucose-Capillary: 111 mg/dL — ABNORMAL HIGH (ref 70–99)
Glucose-Capillary: 119 mg/dL — ABNORMAL HIGH (ref 70–99)

## 2021-08-16 MED ORDER — OXYCODONE HCL 5 MG PO TABS
5.0000 mg | ORAL_TABLET | Freq: Four times a day (QID) | ORAL | Status: DC | PRN
  Administered 2021-08-16 – 2021-08-18 (×5): 5 mg via ORAL
  Filled 2021-08-16 (×5): qty 1

## 2021-08-16 MED ORDER — POLYETHYLENE GLYCOL 3350 17 G PO PACK
17.0000 g | PACK | Freq: Every day | ORAL | Status: DC
  Administered 2021-08-16 – 2021-08-18 (×3): 17 g via ORAL
  Filled 2021-08-16 (×3): qty 1

## 2021-08-16 MED ORDER — MELATONIN 5 MG PO TABS
5.0000 mg | ORAL_TABLET | Freq: Every evening | ORAL | Status: DC | PRN
  Administered 2021-08-16 – 2021-08-17 (×2): 5 mg via ORAL
  Filled 2021-08-16 (×2): qty 1

## 2021-08-16 MED ORDER — BISACODYL 10 MG RE SUPP: 10.0000 mg | Freq: Every day | RECTAL | Status: DC | PRN

## 2021-08-16 MED ORDER — STROKE: EARLY STAGES OF RECOVERY BOOK
Freq: Once | Status: DC
  Filled 2021-08-16: qty 1

## 2021-08-16 NOTE — [place] FL2 (Signed)
[place] MEDICAID FL2 LEVEL OF CARE SCREENING TOOL     IDENTIFICATION  Patient Name: Steve Alvarado Birthdate: May 20, 1955 Sex: male Admission Date (Current Location): 08/12/2021  [facility] and [place] Number:  Chiropodist and Address:  [facility]. [facility], [address] [place], [place] [address]      Provider Number: [id number]  Attending Physician Name and Address:  Stroke, Md, MD  Relative Name and Phone Number:       Current Level of Care: Hospital Recommended Level of Care: Skilled Nursing Facility Prior Approval Number:    Date Approved/Denied:   PASRR Number: [id number]  Discharge Plan: SNF    Current Diagnoses: Patient Active Problem List   Diagnosis Date Noted   Stroke (cerebrum) (HCC) 08/12/2021   Uncontrolled type 2 diabetes mellitus with hyperglycemia, with long-term current use of insulin (HCC) 07/09/2021   Hypothyroidism 07/09/2021   AKI (acute kidney injury) (HCC) 07/08/2021   Chest pain 10/21/2020   Depression 10/21/2020   Non compliance w medication regimen 10/21/2020   Cocaine abuse (HCC) 09/05/2020   Adjustment disorder with emotional disturbance 09/05/2020   Hypertensive urgency 03/08/2015   Essential hypertension 02/14/2011   CAD (coronary artery disease) 08/13/2010   Stented coronary artery 08/13/2010   Diabetes mellitus (HCC) 08/13/2010    Orientation RESPIRATION BLADDER Height & Weight     Self, Time, Situation, Place  Normal Continent, External catheter Weight: 209 lb 7 oz (95 kg) Height:  6\' 2"  (188 cm)  BEHAVIORAL SYMPTOMS/MOOD NEUROLOGICAL BOWEL NUTRITION STATUS      Continent Diet (See dc summary)  AMBULATORY STATUS COMMUNICATION OF NEEDS Skin   Extensive Assist Verbally Normal                       Personal Care Assistance Level of Assistance  Bathing, Feeding, Dressing Bathing Assistance: Limited assistance Feeding assistance: Independent Dressing Assistance: Limited assistance      Functional Limitations Info             SPECIAL CARE FACTORS FREQUENCY  PT (By licensed PT), OT (By licensed OT)     PT Frequency: 5x/week OT Frequency: 5x/week            Contractures Contractures Info: Not present    Additional Factors Info  Code Status, Allergies, Insulin Sliding Scale Code Status Info: Full Allergies Info: Ace Inhibitors   Insulin Sliding Scale Info: See DC Summary       Current Medications (08/16/2021):  This is the current hospital active medication list Current Facility-Administered Medications  Medication Dose Route Frequency Provider Last Rate Last Admin   acetaminophen (TYLENOL) tablet 650 mg  650 mg Oral Q4H PRN 08/18/2021 A, NP   650 mg at 08/16/21 0920   Or   acetaminophen (TYLENOL) 160 MG/5ML solution 650 mg  650 mg Per Tube Q4H PRN 08/18/21, NP       Or   acetaminophen (TYLENOL) suppository 650 mg  650 mg Rectal Q4H PRN Mathews Argyle A, NP       amLODipine (NORVASC) tablet 10 mg  10 mg Oral q morning Gevena Mart, MD   10 mg at 08/16/21 08/18/21   aspirin EC tablet 81 mg  81 mg Oral Daily [id number], MD   81 mg at 08/16/21 0912   atorvastatin (LIPITOR) tablet 40 mg  40 mg Oral Daily de 08/18/21, Cortney E, NP   40 mg at 08/16/21 0912   carvedilol (COREG)  tablet 3.125 mg  3.125 mg Oral BID WC Marvel Plan, MD   3.125 mg at 08/16/21 0912   Chlorhexidine Gluconate Cloth 2 % PADS 6 each  6 each Topical Daily Rica Mote, MD   6 each at 08/16/21 1116   citalopram (CELEXA) tablet 20 mg  20 mg Oral Daily Marvel Plan, MD   20 mg at 08/16/21 0912   clopidogrel (PLAVIX) tablet 75 mg  75 mg Oral Daily Marvel Plan, MD   75 mg at 08/16/21 0912   enoxaparin (LOVENOX) injection 40 mg  40 mg Subcutaneous Q24H Marvel Plan, MD   40 mg at 08/15/21 1729   gabapentin (NEURONTIN) capsule 400 mg  400 mg Oral QHS Bhagat, Srishti L, MD   400 mg at 08/15/21 2112   insulin aspart (novoLOG) injection 0-15 Units  0-15 Units Subcutaneous TID WC Elmer Picker, NP   2 Units at 08/15/21 1241   insulin glargine-yfgn (SEMGLEE) injection 15 Units  15 Units Subcutaneous BID Elmer Picker, NP   15 Units at 08/16/21 0912   labetalol (NORMODYNE) injection 10 mg  10 mg Intravenous Q10 min PRN Mathews Argyle, NP       levothyroxine (SYNTHROID) tablet 75 mcg  75 mcg Oral Q2200 Marvel Plan, MD   75 mcg at 08/15/21 2124   losartan (COZAAR) tablet 50 mg  50 mg Oral q morning Marvel Plan, MD   50 mg at 08/16/21 0912   melatonin tablet 3 mg  3 mg Oral Once Eduard Clos, MD       melatonin tablet 5 mg  5 mg Oral QHS PRN de Saintclair Halsted, Cortney E, NP       oxyCODONE (Oxy IR/ROXICODONE) immediate release tablet 5 mg  5 mg Oral Q6H PRN de Saintclair Halsted, Cortney E, NP   5 mg at 08/16/21 1141   pantoprazole (PROTONIX) EC tablet 40 mg  40 mg Oral Daily Marvel Plan, MD   40 mg at 08/16/21 0912   senna-docusate (Senokot-S) tablet 1 tablet  1 tablet Oral QHS PRN Gevena Mart A, NP       sodium chloride flush (NS) 0.9 % injection 3 mL  3 mL Intravenous Once Gwyneth Sprout, MD         Discharge Medications: Please see discharge summary for a list of discharge medications.  Relevant Imaging Results:  Relevant Lab Results:   Additional Information SS# [id number]. PFIZER Comrnaty(Gray TOP) Covid-19 Vaccine 11/13/2020  Mearl Latin, LCSW

## 2021-08-16 NOTE — Plan of Care (Signed)
  Problem: Nutrition: Goal: Risk of aspiration will decrease Outcome: Progressing   Problem: Education: Goal: Knowledge of disease or condition will improve Outcome: Not Progressing Goal: Knowledge of secondary prevention will improve (SELECT ALL) Outcome: Not Progressing Goal: Knowledge of patient specific risk factors will improve (INDIVIDUALIZE FOR PATIENT) Outcome: Not Progressing   Problem: Coping: Goal: Will verbalize positive feelings about self Outcome: Not Progressing   Problem: Health Behavior/Discharge Planning: Goal: Ability to manage health-related needs will improve Outcome: Not Progressing   Problem: Self-Care: Goal: Ability to participate in self-care as condition permits will improve Outcome: Not Progressing

## 2021-08-16 NOTE — Progress Notes (Signed)
Speech Language Pathology Treatment: Dysphagia  Patient Details Name: Steve Alvarado MRN: [id number] DOB: February 03, 1956 Today's Date: 08/16/2021 Time: [id number] SLP Time Calculation (min) (ACUTE ONLY): 15 min  Assessment / Plan / Recommendation Clinical Impression  Patient seen by SLP for skilled therapy session focused on dysphagia goals. When SLP entered room, patient was sitting up semi-edge of bed supported by pillows and wife was feeding him lunch. Patient independently demonstrated swallow strategies that another SLP had taught him during this admission. Mastication was slow but no significant oral residuals observed post swallows. Patient did have some delayed throat clearing but this appeared to be congestion rather than overt s/s aspiration. Although not specifically addressed, patient's speech was intelligible at conversational level with SLP not having any difficulty understanding him. He continues with mildly decreased speech articulation accuracy secondary to left sided weakness. SLP spoke with patient's daughter briefly on the phone when she called and she denied having any difficulty understanding patient. SLP recommending continued acute level services and discharge plan has reportedly changed from AIR to SNF due to patient's wife not able to provide enough supervision/care.   HPI HPI: Patient is a 66 yo M who presented to ED with acute onset left sided weakness resulting in a fall and left facial droop.  CT reveals no acute intracranial process, MRI revealed Acute nonhemorrhagic infarct involving the posterior right  lentiform nucleus and corona radiata measure. TNK was given in the ED to treat his stroke.  Pt states his deficits have not improved. Pt with past medical history of CAD, HTN, HLD, DM and thyroid disease.      SLP Plan  Continue with current plan of care      Recommendations for follow up therapy are one component of a multi-disciplinary discharge planning process, led  by the attending physician.  Recommendations may be updated based on patient status, additional functional criteria and insurance authorization.    Recommendations  Diet recommendations: Regular;Thin liquid Liquids provided via: Cup;Straw Medication Administration: Whole meds with liquid Supervision: Patient able to self feed Compensations: Slow rate;Small sips/bites;Follow solids with liquid;Lingual sweep for clearance of pocketing Postural Changes and/or Swallow Maneuvers: Seated upright 90 degrees                Oral Care Recommendations: Oral care BID Follow Up Recommendations: Skilled nursing-short term rehab (<3 hours/day) Assistance recommended at discharge: Intermittent Supervision/Assistance SLP Visit Diagnosis: Dysphagia, unspecified (R13.10) Plan: Continue with current plan of care         Angela Nevin, MA, CCC-SLP Speech Therapy

## 2021-08-16 NOTE — Progress Notes (Addendum)
STROKE TEAM PROGRESS NOTE   INTERVAL HISTORY Patient is seen in his room with his wife at the bedside.  He has been able to sit up on the side of the bed today and is eager to work with PT.  He has not had a bowel movement in about 7 days- Miralax and bisacodyl suppository ordered. Vitals:   08/16/21 0000 08/16/21 0400 08/16/21 0722 08/16/21 1100  BP: (!) 159/77 (!) 174/78 (!) 153/76 (!) 164/80  Pulse: 62 65 66 62  Resp: 13 16 13 19   Temp: 98 F (36.7 C) 98.5 F (36.9 C) 98 F (36.7 C) 98.4 F (36.9 C)  TempSrc: Oral Oral Oral Oral  SpO2:  98% 100% 99%  Weight:      Height:       CBC:  Recent Labs  Lab 08/12/21 1149 08/12/21 1157 08/15/21 0125 08/16/21 0122  WBC 5.3  --  6.2 6.3  NEUTROABS 2.5  --   --   --   HGB 10.9*   < > 11.0* 11.1*  HCT 32.3*   < > 32.4* 32.3*  MCV 90.0  --  86.2 86.1  PLT 218  --  229 206   < > = values in this interval not displayed.    Basic Metabolic Panel:  Recent Labs  Lab 08/12/21 1502 08/14/21 0325 08/15/21 0125 08/16/21 0122  NA  --    < > 138 137  K  --    < > 4.1 3.8  CL  --    < > 108 109  CO2  --    < > 24 24  GLUCOSE  --    < > 146* 102*  BUN  --    < > 19 19  CREATININE  --    < > 1.11 1.09  CALCIUM  --    < > 9.4 9.2  MG 1.6*  --   --   --   PHOS 3.3  --   --   --    < > = values in this interval not displayed.    Lipid Panel:  Recent Labs  Lab 08/13/21 0544  CHOL 146  TRIG 99  HDL 66  CHOLHDL 2.2  VLDL 20  LDLCALC 60    HgbA1c:  Recent Labs  Lab 08/13/21 0544  HGBA1C 7.9*    Urine Drug Screen:  Recent Labs  Lab 08/13/21 0924  LABOPIA NONE DETECTED  COCAINSCRNUR NONE DETECTED  LABBENZ NONE DETECTED  AMPHETMU NONE DETECTED  THCU NONE DETECTED  LABBARB NONE DETECTED     Alcohol Level No results for input(s): ETH in the last 168 hours.  IMAGING past 24 hours No results found.  PHYSICAL EXAM General:  Alert, well-developed, well-nourished middle-aged African-American male in no acute  distress Respiratory:  Regular, unlabored respirations on room air  NEURO:  Mental Status: AA&Ox3  Speech/Language: speech has mild dysarthria  Cranial Nerves:  II: PERRL. Visual fields full.  III, IV, VI: EOMI. Eyelids elevate symmetrically.  V: Sensation is intact to light touch and symmetrical to face.  VII: Moderate left lower facial weakness and mild left eye closure weakness.  VIII: hearing intact to voice. IX, X: Phonation is normal.  XII: tongue is midline without fasciculations. Motor: 5/5 strength to RUE and RLE, 1/5 to LUE and 2/5 to LLE.   Tone is increased on the left sideand bulk is normal Sensation- Intact to light touch bilaterally.  Coordination: FTN intact on right Gait- deferred  ASSESSMENT/PLAN Mr. MARCUS BOUDER is a 66 y.o. male with history of CAD s/p stenting, HTN, HLD, DM and cocaine abuse presenting with acute onset left sided weakness resulting in a fall and left facial droop.  CT reveals no acute intracranial process, MRI pending.  TNK was given in the ED to treat his stroke.  Symptoms did not improve much after TNK, and patient is awaiting transfer to CIR.  Stroke:  right subcortical infarct likely secondary due to small vessel disease, however cardioembolic source cannot be completed without given relatively larger size infarct CT head No acute abnormality. Small vessel disease. Atrophy. ASPECTS 10.    CTA head & neck negative for LVO, moderate bilateral distal vertebral artery stenosis, right more than left MRI  acute infarct in posterior right lentiform nucleus and corona radiata 2D Echo EF [id number], grade 1 diastolic dysfunction, mildly dilated left atrium, no atrial level shunt Recommend 30-day Cardiac monitoring as outpatient to rule out A-fib. LDL 60 HgbA1c 7.9 UDS negative this admission VTE prophylaxis -Lovenox No antithrombotic prior to admission, now on aspirin 81 mg daily and clopidogrel 75 mg daily DAPT for 3 weeks and then aspirin  only. Therapy recommendations:  CIR Disposition:  pending  Hypertension Home meds:  amlodipine 5 mg daily, losartan 50 mg daily, Coreg 3.125 twice daily, isosorbide 30 mg Stable on the high end Now on coreg 3.125, losartan 50 Add amlodipine 10 Long-term BP goal normotensive  Hyperlipidemia Home meds:  atorvastatin 40 mg daily, resumed in hospital LDL 60, goal < 70 Continue statin at discharge  Diabetes type II Uncontrolled Home meds:  metformin 1000 mg daily, insulin glargine 30 units daily, insulin lispro 3 units TID with meals HgbA1c 7.9, goal < 7.0 Now on insulin CBGs SSI Close PCP follow-up for better DM control  Other Stroke Risk Factors Advanced Age >/= 65  Coronary artery disease status post stenting History of cocaine abuse, last cocaine positive 10/2020  Other Active Problems none  Hospital day # [place] , MSN, AGACNP-BC [facility] See Amion for schedule and pager information 08/16/2021 2:11 PM  ATTENDING NOTE: I reviewed above note and agree with the assessment and plan. Pt was seen and examined.   Wife at bedside.  Patient lying in bed, still has left hemiplegia but encouraged to work with PT/OT aggressively.  Pending SNF placement.  Has constipation, will start bowel regimen.  For detailed assessment and plan, please refer to above as I have made changes wherever appropriate.   Rosalin Hawking, MD PhD Stroke Neurology 08/16/2021 6:20 PM    To contact Stroke Continuity provider, please refer to [email or web address] After hours, contact General Neurology

## 2021-08-16 NOTE — Progress Notes (Signed)
Physical Therapy Treatment Patient Details Name: Steve Alvarado MRN: [id number] DOB: Dec 19, 1955 Today's Date: 08/16/2021   History of Present Illness 66 y.o. male presents to [facility] hospital on 08/12/2021 with L weakness and facial droop. CTH negative. Pt received TNK. 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    PT Comments    Patient eager to get OOB. Continues with strong left lateral lean and pushing with RUE. Able to use Stedy with +2 max assist. Will continue to benefit from PT at SNF (noted wife has requested SNF).    Recommendations for follow up therapy are one component of a multi-disciplinary discharge planning process, led by the attending physician.  Recommendations may be updated based on patient status, additional functional criteria and insurance authorization.  Follow Up Recommendations  Skilled nursing-short term rehab (<3 hours/day)     Assistance Recommended at Discharge Intermittent Supervision/Assistance  Patient can return home with the following Two people to help with walking and/or transfers;Two people to help with bathing/dressing/bathroom;Assistance with cooking/housework;Assistance with feeding;Direct supervision/assist for medications management;Direct supervision/assist for financial management;Assist for transportation;Help with stairs or ramp for entrance   Equipment Recommendations  Wheelchair (measurements PT);BSC/3in1;Other (comment) (hoyer lift)    Recommendations for Other Services       Precautions / Restrictions Precautions Precautions: Fall Restrictions Weight Bearing Restrictions: No     Mobility  Bed Mobility Overal bed mobility: Needs Assistance Bed Mobility: Rolling, Sidelying to Sit Rolling: Min assist (to left with rail) Sidelying to sit: Max assist, +2 for physical assistance       General bed mobility comments: up  from left side with HOB elevated    Transfers Overall transfer level: Needs assistance   Transfers: Sit to/from Stand, Bed to chair/wheelchair/BSC Sit to Stand: Max assist, +2 physical assistance Stand pivot transfers: Max assist, +2 physical assistance         General transfer comment: pt with left lateral lean in sitting and standing; requires one person helping him hold torso in midline when using stedy to pivot from bed to chair Transfer via Lift Equipment: Stedy  Ambulation/Gait                   Stairs             Wheelchair Mobility    Modified Rankin (Stroke Patients Only) Modified Rankin (Stroke Patients Only) Pre-Morbid Rankin Score: Moderately severe disability ([facility] at [facility]) Modified Rankin: Severe disability     Balance Overall balance assessment: Needs assistance Sitting-balance support: Single extremity supported, Feet supported Sitting balance-Leahy Scale: Poor Sitting balance - Comments: maxA, left lateral lean. Pt is able to lean onto R elbow, otherwise intermittent pushing through extended RUE Postural control: Left lateral lean Standing balance support: Single extremity supported, Bilateral upper extremity supported Standing balance-Leahy Scale: Poor Standing balance comment: maxA, left lean                            Cognition Arousal/Alertness: Awake/alert Behavior During Therapy: WFL for tasks assessed/performed Overall Cognitive Status: Impaired/Different from baseline Area of Impairment: Awareness                           Awareness: Emergent   General Comments: pt with impaired awareness of balance deficits, unable to correct balance deficits at this time  Exercises General Exercises - Lower Extremity Long Arc Quad: AROM, Right, 10 reps, Seated Hip Flexion/Marching: AROM, Right, 10 reps, Seated Other Exercises Other Exercises: attempted AAROM with LLE with leg flaccid  and no tone or contraction noted    General Comments General comments (skin integrity, edema, etc.): wife present      Pertinent Vitals/Pain Pain Assessment Pain Assessment: No/denies pain    Home Living                          Prior Function            PT Goals (current goals can now be found in the care plan section) Acute Rehab PT Goals Patient Stated Goal: to improve strength and reduce falls risk Time For Goal Achievement: 08/27/21 Potential to Achieve Goals: Fair Progress towards PT goals: Progressing toward goals    Frequency    Min 3X/week      PT Plan Discharge plan needs to be updated;Frequency needs to be updated    Co-evaluation              AM-PAC PT "6 Clicks" Mobility   Outcome Measure  Help needed turning from your back to your side while in a flat bed without using bedrails?: A Lot Help needed moving from lying on your back to sitting on the side of a flat bed without using bedrails?: Total Help needed moving to and from a bed to a chair (including a wheelchair)?: Total Help needed standing up from a chair using your arms (e.g., wheelchair or bedside chair)?: Total Help needed to walk in hospital room?: Total Help needed climbing 3-5 steps with a railing? : Total 6 Click Score: 7    End of Session Equipment Utilized During Treatment: Gait belt Activity Tolerance: Patient tolerated treatment well Patient left: with call bell/phone within reach;in chair;with family/visitor present Nurse Communication: Mobility status;Need for lift equipment PT Visit Diagnosis: Other abnormalities of gait and mobility (R26.89);Hemiplegia and hemiparesis Hemiplegia - Right/Left: Left Hemiplegia - dominant/non-dominant: Non-dominant Hemiplegia - caused by: Cerebral infarction     Time: 1352-1415 PT Time Calculation (min) (ACUTE ONLY): 23 min  Charges:  $Neuromuscular Re-education: 23-37 mins                      Jerolyn Center, PT Acute  Rehabilitation Services  Pager [phone] Office [phone]    [name] 08/16/2021, 2:27 PM

## 2021-08-16 NOTE — Care Management Important Message (Signed)
Important Message  Patient Details  Name: Steve Alvarado MRN: [id number] Date of Birth: May 23, 1955   Medicare Important Message Given:  Yes     Dwaine Pringle Stefan Church 08/16/2021, 2:31 PM

## 2021-08-16 NOTE — TOC Initial Note (Addendum)
Transition of Care [facility](TOC) - Initial/Assessment Note    Patient Details  Name: Steve Alvarado MRN: [id number] Date of Birth: December 11, 1955  Transition of Care [facility](TOC) CM/SW Contact:    Mearl LatinNadia S Tabby Beaston, LCSW Phone Number: 08/16/2021, 4:21 PM  Clinical Narrative:                 CSW spoke with patient, wife, and son at bedisde regarding [facility] at discharge. Patient asked if there were any other facilities in [place]. CSW provided other offers of [facility] and [facility]. Patient agreeable to checking into [facility]. CSW warned they may not accept due to patient being in his Medicare copay days and needing to use his Medicaid to remain in SNF. He expressed understanding, and stated he would be fine with [facility] if not. CSW requested [facility] referral. CSW submitted clinicals to insurance for review pending facility choice, Ref# [id number].   Expected Discharge Plan: Skilled Nursing Facility Barriers to Discharge: Insurance Authorization, Continued Medical Work up   Patient Goals and CMS Choice Patient states their goals for this hospitalization and ongoing recovery are:: Rehab CMS Medicare.gov Compare Post Acute Care list provided to:: Patient Choice offered to / list presented to : Patient, Adult Children, Spouse  Expected Discharge Plan and Services Expected Discharge Plan: Skilled Nursing Facility In-house Referral: Clinical Social Work   Post Acute Care Choice: Skilled Nursing Facility Living arrangements for the past 2 months: Skilled Nursing Facility                                      Prior Living Arrangements/Services Living arrangements for the past 2 months: Skilled Nursing Facility Lives with:: Facility Resident Patient language and need for interpreter reviewed:: Yes        Need for Family Participation in Patient Care: Yes (Comment) Care giver support system in place?: Yes (comment) Current home services: DME  (rollator) Criminal Activity/Legal Involvement Pertinent to Current Situation/Hospitalization: No - Comment as needed  Activities of Daily Living Home Assistive Devices/Equipment: None ADL Screening (condition at time of admission) Patient's cognitive ability adequate to safely complete daily activities?: Yes Is the patient deaf or have difficulty hearing?: No Does the patient have difficulty seeing, even when wearing glasses/contacts?: No Does the patient have difficulty concentrating, remembering, or making decisions?: No Patient able to express need for assistance with ADLs?: No Does the patient have difficulty dressing or bathing?: No Independently performs ADLs?: Yes (appropriate for developmental age) Does the patient have difficulty walking or climbing stairs?: Yes Weakness of Legs: Left Weakness of Arms/Hands: Left  Permission Sought/Granted      Share Information with NAME: [name]  Permission granted to share info w AGENCY: SNFs  Permission granted to share info w Relationship: Spouse  Permission granted to share info w Contact Information: [phone]  Emotional Assessment Appearance:: Appears stated age Attitude/Demeanor/Rapport: Engaged, Gracious Affect (typically observed): Accepting, Apprehensive, Pleasant Orientation: : Oriented to Self, Oriented to Place, Oriented to  Time, Oriented to Situation Alcohol / Substance Use: Not Applicable Psych Involvement: No (comment)  Admission diagnosis:  Stroke (cerebrum) [facility](HCC) [I63.9] Patient Active Problem List   Diagnosis Date Noted   Stroke (cerebrum) (HCC) 08/12/2021   Uncontrolled type 2 diabetes mellitus with hyperglycemia, with long-term current use of insulin (HCC) 07/09/2021   Hypothyroidism 07/09/2021   AKI (acute kidney injury) (HCC) 07/08/2021  Chest pain 10/21/2020   Depression 10/21/2020   Non compliance w medication regimen 10/21/2020   Cocaine abuse ([place]) 09/05/2020   Adjustment disorder with emotional  disturbance 09/05/2020   Hypertensive urgency 03/08/2015   Essential hypertension 02/14/2011   CAD (coronary artery disease) 08/13/2010   Stented coronary artery 08/13/2010   Diabetes mellitus ([place]) 08/13/2010   PCP:  Kingsley Callander, MD Pharmacy:  No Pharmacies Listed    Social Determinants of Health (SDOH) Interventions    Readmission Risk Interventions     View : No data to display.

## 2021-08-16 NOTE — Progress Notes (Signed)
Inpatient Rehab Admissions Coordinator:     I spoke with Pt. And wife regarding potential CIR admit. They state that wife could not handle patient at home after a 2-3 week stay on CIR and are interested in SNF. CIR will sign off and I will notify TOC.  Clemens Catholic, [place], [place] Admissions Coordinator  [phone] ([place]) [phone] (office)

## 2021-08-17 DIAGNOSIS — I63311 Cerebral infarction due to thrombosis of right middle cerebral artery: Secondary | ICD-10-CM | POA: Diagnosis not present

## 2021-08-17 LAB — CBC
HCT: 31.8 % — ABNORMAL LOW (ref 39.0–52.0)
Hemoglobin: 11 g/dL — ABNORMAL LOW (ref 13.0–17.0)
MCH: 30.1 pg (ref 26.0–34.0)
MCHC: 34.6 g/dL (ref 30.0–36.0)
MCV: 86.9 fL (ref 80.0–100.0)
Platelets: 199 10*3/uL (ref 150–400)
RBC: 3.66 MIL/uL — ABNORMAL LOW (ref 4.22–5.81)
RDW: 12.1 % (ref 11.5–15.5)
WBC: 6.1 10*3/uL (ref 4.0–10.5)
nRBC: 0 % (ref 0.0–0.2)

## 2021-08-17 LAB — BASIC METABOLIC PANEL WITH GFR
Anion gap: 7 (ref 5–15)
BUN: 23 mg/dL (ref 8–23)
CO2: 22 mmol/L (ref 22–32)
Calcium: 9.1 mg/dL (ref 8.9–10.3)
Chloride: 108 mmol/L (ref 98–111)
Creatinine, Ser: 1.38 mg/dL — ABNORMAL HIGH (ref 0.61–1.24)
GFR, Estimated: 56 mL/min — ABNORMAL LOW
Glucose, Bld: 96 mg/dL (ref 70–99)
Potassium: 3.9 mmol/L (ref 3.5–5.1)
Sodium: 137 mmol/L (ref 135–145)

## 2021-08-17 LAB — GLUCOSE, CAPILLARY
Glucose-Capillary: 79 mg/dL (ref 70–99)
Glucose-Capillary: 100 mg/dL — ABNORMAL HIGH (ref 70–99)
Glucose-Capillary: 130 mg/dL — ABNORMAL HIGH (ref 70–99)
Glucose-Capillary: 87 mg/dL (ref 70–99)

## 2021-08-17 MED ORDER — INSULIN GLARGINE-YFGN 100 UNIT/ML ~~LOC~~ SOLN
10.0000 [IU] | Freq: Every day | SUBCUTANEOUS | Status: DC
Start: 2021-08-17 — End: 2021-08-18
  Administered 2021-08-17: 10 [IU] via SUBCUTANEOUS
  Filled 2021-08-17 (×2): qty 0.1

## 2021-08-17 MED ORDER — ASPIRIN 81 MG PO TBEC: 81.0000 mg | DELAYED_RELEASE_TABLET | Freq: Every day | ORAL | 12 refills | Status: DC

## 2021-08-17 MED ORDER — INSULIN GLARGINE-YFGN 100 UNIT/ML ~~LOC~~ SOLN: 10.0000 [IU] | Freq: Every day | SUBCUTANEOUS | 11 refills | Status: DC

## 2021-08-17 MED ORDER — CLOPIDOGREL BISULFATE 75 MG PO TABS: 75.0000 mg | ORAL_TABLET | Freq: Every day | ORAL | 0 refills | Status: DC

## 2021-08-17 MED ORDER — INSULIN GLARGINE-YFGN 100 UNIT/ML ~~LOC~~ SOLN
10.0000 [IU] | Freq: Two times a day (BID) | SUBCUTANEOUS | Status: DC
  Filled 2021-08-17: qty 0.1

## 2021-08-17 MED ORDER — CLOPIDOGREL BISULFATE 75 MG PO TABS: 75.0000 mg | ORAL_TABLET | Freq: Every day | ORAL | Status: DC

## 2021-08-17 MED ORDER — ASPIRIN 81 MG PO TBEC: 81.0000 mg | DELAYED_RELEASE_TABLET | Freq: Every day | ORAL | 12 refills | Status: AC

## 2021-08-17 NOTE — Progress Notes (Signed)
Occupational Therapy Treatment Patient Details Name: Steve Alvarado MRN: [id number] DOB: 17-Apr-1955 Today's Date: 08/17/2021   History of present illness 66 y.o. male presents to [facility] hospital on 08/12/2021 with L weakness and facial droop. CTH negative. Pt received TNK. 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   OT comments  Pt making slow progress towards goals, able to complete sit to stand transfer with max A +2, transferred back to bed with use of Stedy. Pt with strong L lateral lean when sitting unsupported requiring max A to recorrect. Pt able to complete weight shifting to L elbow x 5 during session, along with seated balance activities giving cues to correct to midline. Pt presenting with impairments listed below, will follow acutely. Noted CIR signing off due to family request, updating d/c recommendation to SNF.   Recommendations for follow up therapy are one component of a multi-disciplinary discharge planning process, led by the attending physician.  Recommendations may be updated based on patient status, additional functional criteria and insurance authorization.    Follow Up Recommendations  Skilled nursing-short term rehab (<3 hours/day)    Assistance Recommended at Discharge Intermittent Supervision/Assistance  Patient can return home with the following  Two people to help with bathing/dressing/bathroom;Two people to help with walking and/or transfers;Assistance with cooking/housework;Assistance with feeding;Help with stairs or ramp for entrance;Assist for transportation;Direct supervision/assist for financial management;Direct supervision/assist for medications management   Equipment Recommendations  None recommended by OT;Other (comment) (defer to next venue of care)    Recommendations for Other Services      Precautions / Restrictions  Precautions Precautions: Fall Restrictions Weight Bearing Restrictions: No       Mobility Bed Mobility Overal bed mobility: Needs Assistance Bed Mobility: Rolling, Sit to Supine Rolling: Min assist (min A to roll toward R side)     Sit to supine: Max assist, +2 for physical assistance        Transfers Overall transfer level: Needs assistance Equipment used: Ambulation equipment used Transfers: Bed to chair/wheelchair/BSC, Sit to/from Stand Sit to Stand: Max assist, +2 physical assistance Stand pivot transfers: Max assist, +2 physical assistance         General transfer comment: strong L lateral lean Transfer via Lift Equipment: Stedy   Balance Overall balance assessment: Needs assistance Sitting-balance support: Single extremity supported, Feet supported Sitting balance-Leahy Scale: Poor   Postural control: Left lateral lean Standing balance support: Single extremity supported, Bilateral upper extremity supported Standing balance-Leahy Scale: Poor Standing balance comment: maxA, left lean                           ADL either performed or assessed with clinical judgement   ADL Overall ADL's : Needs assistance/impaired                         Toilet Transfer: Maximal assistance;+2 for physical assistance;BSC/3in1;Stand-pivot;Total assistance Toilet Transfer Details (indicate cue type and reason): with use of Stedy         Functional mobility during ADLs: Total assistance;Maximal assistance;+2 for physical assistance      Extremity/Trunk Assessment Upper Extremity Assessment Upper Extremity Assessment: LUE deficits/detail RUE Deficits / Details: atrophy of intrinsic hand muscles RUE Sensation: history of peripheral neuropathy LUE Deficits / Details: flaccid LUE, PROM WFL, atrophy noted of the intrinsic hand muscles LUE Sensation: history of peripheral neuropathy  Lower Extremity Assessment Lower Extremity Assessment: Defer to PT  evaluation        Vision   Vision Assessment?: Vision impaired- to be further tested in functional context   Perception Perception Perception: Not tested   Praxis Praxis Praxis: Not tested    Cognition Arousal/Alertness: Awake/alert Behavior During Therapy: WFL for tasks assessed/performed Overall Cognitive Status: Impaired/Different from baseline Area of Impairment: Awareness                           Awareness: Emergent            Exercises Other Exercises Other Exercises: PROM of L shoulder, elbow, wrist hand Other Exercises: weight shifting to RUE Other Exercises: seated balance activity, recorrecting to midline    Shoulder Instructions       General Comments VSS on RA    Pertinent Vitals/ Pain       Pain Assessment Pain Assessment: No/denies pain Pain Intervention(s): Limited activity within patient's tolerance, Monitored during session  Home Living                                          Prior Functioning/Environment              Frequency  Min 2X/week        Progress Toward Goals  OT Goals(current goals can now be found in the care plan section)  Progress towards OT goals: Progressing toward goals  Acute Rehab OT Goals Patient Stated Goal: to get better OT Goal Formulation: With patient Time For Goal Achievement: 08/27/21 Potential to Achieve Goals: Good ADL Goals Pt Will Perform Grooming: with min assist;sitting Pt Will Perform Upper Body Bathing: with min assist;sitting Pt Will Transfer to Toilet: with min assist;stand pivot transfer;squat pivot transfer;bedside commode Additional ADL Goal #1: Pt will be able to maintain EOB balance with no more than min A Additional ADL Goal #2: Pt will be able locate items on table in front of him without cues Additional ADL Goal #3: Pt will be able to roll left and right in bed with min A to A with basic ADLs  Plan Frequency remains appropriate;Discharge plan needs  to be updated    Co-evaluation                 AM-PAC OT "6 Clicks" Daily Activity     Outcome Measure   Help from another person eating meals?: A Little Help from another person taking care of personal grooming?: A Lot Help from another person toileting, which includes using toliet, bedpan, or urinal?: Total Help from another person bathing (including washing, rinsing, drying)?: Total Help from another person to put on and taking off regular upper body clothing?: A Lot Help from another person to put on and taking off regular lower body clothing?: Total 6 Click Score: 10    End of Session Equipment Utilized During Treatment: Gait belt  OT Visit Diagnosis: Unsteadiness on feet (R26.81);Other abnormalities of gait and mobility (R26.89);Muscle weakness (generalized) (M62.81);Low vision, both eyes (H54.2);Other symptoms and signs involving cognitive function;Hemiplegia and hemiparesis;Pain Hemiplegia - Right/Left: Left Hemiplegia - dominant/non-dominant: Non-Dominant Hemiplegia - caused by: Cerebral infarction Pain - part of body: Hand   Activity Tolerance Patient tolerated treatment well   Patient Left in bed;with call bell/phone within reach;with bed alarm set   Nurse Communication Mobility status;Need for lift  equipment (use of stedy)        Time: [id number] OT Time Calculation (min): 24 min  Charges: OT General Charges $OT Visit: 1 Visit OT Treatments $Therapeutic Activity: 8-22 mins $Neuromuscular Re-education: 8-22 mins  Lynnda Child, OTD, OTR/L Acute Rehab [phone] - [place] 08/17/2021, 3:58 PM

## 2021-08-17 NOTE — TOC Progression Note (Addendum)
Transition of Care [facility]) - Progression Note    Patient Details  Name: Steve Alvarado MRN: [id number] Date of Birth: 08-07-1955  Transition of Care [facility]) CM/SW Contact  Mearl Latin, LCSW Phone Number: 08/17/2021, 9:06 AM  Clinical Narrative:    9am-[facility] unable to accept patient due to requiring patient pay co-pays up front. CSW updated [employer] that patient will be returning to [facility] pending insurance approval.   10:10am-Insurance requesting peer to peer for SNF [phone], opt 5), deadline 2pm today. MD notified.   2pm-Peer to peer denied per MD. CSW notified [facility] of need to accept patient back under Medicaid. [place] [facility] stated that they are waiting to hear back from the [facility] office. CSW sent DC Summary.  3:33pm-CSW alerted [facility] Supervisor to potential issue with discharge if [facility] refuses to accept patient back today.   4:38pm-[place] Ranger has received approval from [facility] but cannot accept patient until tomorrow. MD updated.     Expected Discharge Plan: Skilled Nursing Facility Barriers to Discharge: English as a second language teacher, Continued Medical Work up  Expected Discharge Plan and Services Expected Discharge Plan: Skilled Nursing Facility In-house Referral: Clinical Social Work   Post Acute Care Choice: Skilled Nursing Facility Living arrangements for the past 2 months: Skilled Nursing Facility                                       Social Determinants of Health (SDOH) Interventions    Readmission Risk Interventions     View : No data to display.

## 2021-08-17 NOTE — Progress Notes (Addendum)
Inpatient Diabetes Program Recommendations  AACE/ADA: New Consensus Statement on Inpatient Glycemic Control   Target Ranges:  Prepandial:   less than 140 mg/dL      Peak postprandial:   less than 180 mg/dL (1-2 hours)      Critically ill patients:  140 - 180 mg/dL    Latest Reference Range & Units 08/16/21 07:25 08/16/21 12:04 08/16/21 15:38 08/16/21 20:32 08/17/21 07:43  Glucose-Capillary 70 - 99 mg/dL 107 (H) 124 (H) 111 (H) 119 (H) 79   Review of Glycemic Control  Diabetes history: DM2 Outpatient Diabetes medications: Lantus 30 units Q12H, Humalog 3 units TID with meals, Metformin 1000 mg daily Current orders for Inpatient glycemic control: Semglee 15 units BID, Novolog 0-15 units TID with meals  Inpatient Diabetes Program Recommendations:    Insulin: Fasting glucose 79 mg/dl today. May want to consider decreasing Semglee to 10 units BID.  Thanks, Barnie Alderman, RN, MSN, [place] [facility] Coordinator Inpatient Diabetes Program [phone] (Team Pager from 8am to [place])

## 2021-08-17 NOTE — Discharge Summary (Cosign Needed)
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 likely secondary due to small vessel disease   Principal Problem: Hypertension  Hyperlipidemia Uncontrolled diabetes CAD status post stenting History of cocaine abuse   Allergies as of 08/18/2021       Reactions   Ace Inhibitors Cough        Medication List     STOP taking these medications    insulin glargine-yfgn 100 UNIT/ML Pen Commonly known as: SEMGLEE Replaced by: insulin glargine-yfgn 100 UNIT/ML injection   isosorbide dinitrate 30 MG tablet Commonly known as: ISORDIL       TAKE these medications    amLODipine 10 MG tablet Commonly known as: NORVASC Take 1 tablet (10 mg total) by mouth every morning. Start taking on: August 19, 2021 What changed:  medication strength how much to take   Aspercreme Lidocaine 4 % Generic drug: lidocaine Place 1 patch onto the skin See admin instructions. Apply one patch topically to left abdomen every morning - leave on for 12 hours   aspirin EC 81 MG tablet Take 1 tablet (81 mg total) by mouth daily. Swallow whole.   atorvastatin 40 MG tablet Commonly known as: LIPITOR Take 1 tablet (40 mg total) by mouth every evening.   carvedilol 3.125 MG tablet Commonly known as: COREG Take 1 tablet (3.125 mg total) by mouth 2 (two) times daily with a meal. What changed: when to take this   citalopram 20 MG tablet Commonly known as: CELEXA Take 1 tablet (20 mg total) by mouth daily. What changed: when to take this   clopidogrel 75 MG tablet Commonly known as: PLAVIX Take 1 tablet (75 mg total) by mouth daily.   gabapentin 400 MG capsule Commonly known as: NEURONTIN Take 400 mg by mouth 3  (three) times daily. 8am, 12pm 4pm   HumaLOG 100 UNIT/ML injection Generic drug: insulin lispro Inject 0.03 mLs (3 Units total) into the skin 3 (three) times daily with meals.   insulin glargine-yfgn 100 UNIT/ML injection Commonly known as: SEMGLEE Inject 0.1 mLs (10 Units total) into the skin at bedtime. Replaces: insulin glargine-yfgn 100 UNIT/ML Pen   levothyroxine 75 MCG tablet Commonly known as: SYNTHROID Take 75 mcg by mouth See admin instructions. Take one tablet (75 mcg) by mouth daily at 10pm   losartan 50 MG tablet Commonly known as: COZAAR Take 50 mg by mouth every morning.   metFORMIN 1000 MG tablet Commonly known as: GLUCOPHAGE Take 1,000 mg by mouth 2 (two) times daily.   pantoprazole 40 MG tablet Commonly known as: PROTONIX Take 40 mg by mouth every morning.        LABORATORY STUDIES CBC    Component Value Date/Time   WBC 6.5 08/18/2021 0234   RBC 4.02 (L) 08/18/2021 0234   HGB 11.9 (L) 08/18/2021 0234   HGB 15.2 04/24/2014 0934   HCT 34.7 (L) 08/18/2021 0234   HCT 44.6 04/24/2014 0934   PLT 235 08/18/2021 0234   PLT 252 04/24/2014 0934   MCV 86.3 08/18/2021 0234   MCV 89 04/24/2014 0934   MCH 29.6 08/18/2021 0234   MCHC 34.3 08/18/2021 0234   RDW 12.3 08/18/2021 0234   RDW  12.7 04/24/2014 0934   LYMPHSABS 1.8 08/12/2021 1149   LYMPHSABS 2.4 04/24/2014 0934   MONOABS 0.5 08/12/2021 1149   MONOABS 0.5 04/24/2014 0934   EOSABS 0.5 08/12/2021 1149   EOSABS 0.3 04/24/2014 0934   BASOSABS 0.0 08/12/2021 1149   BASOSABS 0.0 04/24/2014 0934   CMP    Component Value Date/Time   NA 137 08/18/2021 0234   NA 135 (L) 04/24/2014 0934   K 4.0 08/18/2021 0234   K 4.4 04/24/2014 0934   CL 108 08/18/2021 0234   CL 101 04/24/2014 0934   CO2 23 08/18/2021 0234   CO2 28 04/24/2014 0934   GLUCOSE 85 08/18/2021 0234   GLUCOSE 214 (H) 04/24/2014 0934   BUN 25 (H) 08/18/2021 0234   BUN 18 04/24/2014 0934   CREATININE 1.25 (H) 08/18/2021 0234    CREATININE 1.01 04/24/2014 0934   CALCIUM 9.4 08/18/2021 0234   CALCIUM 9.4 04/24/2014 0934   PROT 6.6 08/12/2021 1149   PROT 7.3 04/24/2014 0934   ALBUMIN 3.5 08/12/2021 1149   ALBUMIN 3.0 (L) 04/24/2014 0934   AST 25 08/12/2021 1149   AST 23 04/24/2014 0934   ALT 25 08/12/2021 1149   ALT 37 04/24/2014 0934   ALKPHOS 71 08/12/2021 1149   ALKPHOS 59 04/24/2014 0934   BILITOT 0.2 (L) 08/12/2021 1149   BILITOT 0.5 04/24/2014 0934   GFRNONAA >60 08/18/2021 0234   GFRNONAA >60 04/24/2014 0934   GFRNONAA 42 (L) 08/21/2013 2230   GFRAA >60 10/12/2019 1526   GFRAA >60 04/24/2014 0934   GFRAA 48 (L) 08/21/2013 2230   COAGS Lab Results  Component Value Date   INR 1.0 08/12/2021   INR 0.9 07/03/2013   INR 0.9 11/18/2008   Lipid Panel    Component Value Date/Time   CHOL 146 08/13/2021 0544   TRIG 99 08/13/2021 0544   HDL 66 08/13/2021 0544   CHOLHDL 2.2 08/13/2021 0544   VLDL 20 08/13/2021 0544   LDLCALC 60 08/13/2021 0544   HgbA1C  Lab Results  Component Value Date   HGBA1C 7.9 (H) 08/13/2021   Urinalysis    Component Value Date/Time   COLORURINE YELLOW (A) 07/08/2021 2103   APPEARANCEUR HAZY (A) 07/08/2021 2103   APPEARANCEUR Clear 08/22/2013 0057   LABSPEC 1.013 07/08/2021 2103   LABSPEC 1.017 08/22/2013 0057   PHURINE 5.0 07/08/2021 2103   GLUCOSEU >=500 (A) 07/08/2021 2103   GLUCOSEU >=500 08/22/2013 0057   HGBUR MODERATE (A) 07/08/2021 2103   BILIRUBINUR NEGATIVE 07/08/2021 2103   BILIRUBINUR Negative 08/22/2013 0057   KETONESUR NEGATIVE 07/08/2021 2103   PROTEINUR 100 (A) 07/08/2021 2103   NITRITE NEGATIVE 07/08/2021 2103   LEUKOCYTESUR NEGATIVE 07/08/2021 2103   LEUKOCYTESUR Negative 08/22/2013 0057   Urine Drug Screen     Component Value Date/Time   LABOPIA NONE DETECTED 08/13/2021 0924   COCAINSCRNUR NONE DETECTED 08/13/2021 0924   COCAINSCRNUR POSITIVE (A) 10/21/2020 1518   LABBENZ NONE DETECTED 08/13/2021 0924   AMPHETMU NONE DETECTED 08/13/2021  0924   THCU NONE DETECTED 08/13/2021 0924   LABBARB NONE DETECTED 08/13/2021 0924    Alcohol Level    Component Value Date/Time   ETH <10 09/05/2020 0714     SIGNIFICANT DIAGNOSTIC STUDIES MR BRAIN WO CONTRAST  Result Date: 08/13/2021 CLINICAL DATA:  Stroke, follow-up. Status post TNK. Left-sided weakness. Slurred speech. EXAM: MRI HEAD WITHOUT CONTRAST TECHNIQUE: Multiplanar, multiecho pulse sequences of the brain and surrounding structures were obtained without intravenous contrast. COMPARISON:  None Available.  FINDINGS: Brain: Diffusion-weighted images demonstrate no acute nonhemorrhagic infarct involving the posterior right lentiform nucleus and corona radiata. The maximum AP dimension is 3.4 cm. T2 signal changes are associated with the area of acute infarction. Generalized atrophy and periventricular white matter changes are otherwise moderately advanced for age. Diffuse white matter changes extend into the brainstem. Remote lacunar infarcts are present the posterior cerebellum bilaterally. A remote lacunar infarct is also present within the left thalamus. The ventricles are proportionate to the degree of atrophy. No acute hemorrhage or mass lesion is present. No significant extraaxial fluid collection is present. The internal auditory canals are within normal limits. Vascular: Flow is present in the major intracranial arteries. Skull and upper cervical spine: The craniocervical junction is normal. Upper cervical spine is within normal limits. Marrow signal is unremarkable. Sinuses/Orbits: Fluid is present in the inferior mastoid air cells bilaterally. No obstructing nasopharyngeal lesion is present. The paranasal sinuses and mastoid air cells are otherwise clear. The globes and orbits are within normal limits. IMPRESSION: 1. Acute nonhemorrhagic infarct involving the posterior right lentiform nucleus and corona radiata measure. 2. Generalized atrophy and white matter disease is moderately  advanced for age. This likely reflects the sequela of chronic microvascular ischemia. 3. Remote lacunar infarcts of the posterior cerebellum bilaterally and left thalamus. Electronically Signed   By: San Morelle M.D.   On: 08/13/2021 16:57   ECHOCARDIOGRAM COMPLETE  Result Date: 08/12/2021    ECHOCARDIOGRAM REPORT   Patient Name:   Steve Alvarado Date of Exam: 08/12/2021 Medical Rec #:  [id number]         Height:       74.0 in Accession #:    [id number]        Weight:       209.0 lb Date of Birth:  [birth year]         BSA:          2.214 m Patient Age:    62 years          BP:           134/72 mmHg Patient Gender: M                 HR:           60 bpm. Exam Location:  Inpatient Procedure: 2D Echo, Cardiac Doppler and Color Doppler Indications:    Stroke  History:        Patient has prior history of Echocardiogram examinations, most                 recent 10/22/2020. CAD; Risk Factors:Diabetes and Hypertension.  Sonographer:    Jefferey Pica Referring Phys: [place]  1. Left ventricular ejection fraction, by estimation, is 60 to 65%. The left ventricle has normal function. The left ventricle has no regional wall motion abnormalities. There is mild left ventricular hypertrophy. Left ventricular diastolic parameters are consistent with Grade I diastolic dysfunction (impaired relaxation).  2. Right ventricular systolic function is low normal. The right ventricular size is normal. Tricuspid regurgitation signal is inadequate for assessing PA pressure.  3. Left atrial size was mildly dilated.  4. The mitral valve is grossly normal. Trivial mitral valve regurgitation.  5. The aortic valve is tricuspid. Aortic valve regurgitation is not visualized.  6. The inferior vena cava is normal in size with greater than 50% respiratory variability, suggesting right atrial pressure of 3 mmHg. Comparison(s): No prior Echocardiogram. Changes from prior study are noted.  10/22/2020: LVEF 60-65%.  FINDINGS  Left Ventricle: Left ventricular ejection fraction, by estimation, is 60 to 65%. The left ventricle has normal function. The left ventricle has no regional wall motion abnormalities. The left ventricular internal cavity size was normal in size. There is  mild left ventricular hypertrophy. Left ventricular diastolic parameters are consistent with Grade I diastolic dysfunction (impaired relaxation). Indeterminate filling pressures. Right Ventricle: The right ventricular size is normal. No increase in right ventricular wall thickness. Right ventricular systolic function is low normal. Tricuspid regurgitation signal is inadequate for assessing PA pressure. Left Atrium: Left atrial size was mildly dilated. Right Atrium: Right atrial size was normal in size. Pericardium: There is no evidence of pericardial effusion. Mitral Valve: The mitral valve is grossly normal. Trivial mitral valve regurgitation. Tricuspid Valve: The tricuspid valve is grossly normal. Tricuspid valve regurgitation is not demonstrated. Aortic Valve: The aortic valve is tricuspid. Aortic valve regurgitation is not visualized. Aortic valve peak gradient measures 6.4 mmHg. Pulmonic Valve: The pulmonic valve was normal in structure. Pulmonic valve regurgitation is not visualized. Aorta: The aortic root and ascending aorta are structurally normal, with no evidence of dilitation. Venous: The inferior vena cava is normal in size with greater than 50% respiratory variability, suggesting right atrial pressure of 3 mmHg. IAS/Shunts: No atrial level shunt detected by color flow Doppler.  LEFT VENTRICLE PLAX 2D LVIDd:         4.80 cm   Diastology LVIDs:         3.10 cm   LV e' medial:    3.92 cm/s LV PW:         1.00 cm   LV E/e' medial:  15.7 LV IVS:        1.20 cm   LV e' lateral:   8.59 cm/s LVOT diam:     2.10 cm   LV E/e' lateral: 7.2 LV SV:         85 LV SV Index:   38 LVOT Area:     3.46 cm  RIGHT VENTRICLE            IVC RV S prime:     6.87  cm/s  IVC diam: 1.90 cm TAPSE (M-mode): 2.2 cm LEFT ATRIUM             Index        RIGHT ATRIUM           Index LA diam:        3.60 cm 1.63 cm/m   RA Area:     10.90 cm LA Vol (A2C):   64.5 ml 29.13 ml/m  RA Volume:   19.70 ml  8.90 ml/m LA Vol (A4C):   80.1 ml 36.17 ml/m LA Biplane Vol: 77.5 ml 35.00 ml/m  AORTIC VALVE                 PULMONIC VALVE AV Area (Vmax): 3.13 cm     PV Vmax:       0.65 m/s AV Vmax:        126.00 cm/s  PV Peak grad:  1.7 mmHg AV Peak Grad:   6.4 mmHg LVOT Vmax:      114.00 cm/s LVOT Vmean:     68.400 cm/s LVOT VTI:       0.245 m  AORTA Ao Root diam: 3.40 cm Ao Asc diam:  3.60 cm MITRAL VALVE MV Area (PHT): 2.62 cm    SHUNTS MV Decel Time: 290 msec    Systemic  VTI:  0.24 m MV E velocity: 61.70 cm/s  Systemic Diam: 2.10 cm MV A velocity: 87.80 cm/s MV E/A ratio:  0.70 Lyman Bishop MD Electronically signed by Lyman Bishop MD Signature Date/Time: 08/12/2021/3:56:57 PM    Final    CT HEAD CODE STROKE WO CONTRAST  Result Date: 08/12/2021 CLINICAL DATA:  Code stroke. Neuro deficit, acute, stroke suspected. EXAM: CT HEAD WITHOUT CONTRAST TECHNIQUE: Contiguous axial images were obtained from the base of the skull through the vertex without intravenous contrast. RADIATION DOSE REDUCTION: This exam was performed according to the departmental dose-optimization program which includes automated exposure control, adjustment of the mA and/or kV according to patient size and/or use of iterative reconstruction technique. COMPARISON:  07/08/2021 FINDINGS: Brain: Mild age related volume loss. Mild chronic small-vessel ischemic change of the white matter. No sign of acute infarction, mass lesion, hemorrhage, hydrocephalus or extra-axial collection. Vascular: There is atherosclerotic calcification of the major vessels at the base of the brain. Skull: Normal Sinuses/Orbits: Mild mucosal thickening. No advanced disease. Orbits negative. Other: None ASPECTS ([place] Stroke Program Early CT Score) -  Ganglionic level infarction (caudate, lentiform nuclei, internal capsule, insula, M1-M3 cortex): 7 - Supraganglionic infarction (M4-M6 cortex): 3 Total score (0-10 with 10 being normal): 10 IMPRESSION: 1. No acute CT finding. Mild age related volume loss and small-vessel change of the white matter. 2. ASPECTS is 10. 3. These results were communicated to Dr. Theda Sers at 12:05 pm on 08/12/2021 by text page via the [facility] messaging system. Electronically Signed   By: Nelson Chimes M.D.   On: 08/12/2021 12:05   CT ANGIO HEAD NECK W WO CM (CODE STROKE)  Result Date: 08/12/2021 CLINICAL DATA:  Acute neuro deficit.  Rule out stroke EXAM: CT ANGIOGRAPHY HEAD AND NECK TECHNIQUE: Multidetector CT imaging of the head and neck was performed using the standard protocol during bolus administration of intravenous contrast. Multiplanar CT image reconstructions and MIPs were obtained to evaluate the vascular anatomy. Carotid stenosis measurements (when applicable) are obtained utilizing NASCET criteria, using the distal internal carotid diameter as the denominator. RADIATION DOSE REDUCTION: This exam was performed according to the departmental dose-optimization program which includes automated exposure control, adjustment of the mA and/or kV according to patient size and/or use of iterative reconstruction technique. CONTRAST:  71mL OMNIPAQUE IOHEXOL 350 MG/ML SOLN COMPARISON:  CT head 08/12/2021 FINDINGS: CTA NECK FINDINGS Aortic arch: Limited evaluation of the arch. Atherosclerotic calcification in the aortic arch. Proximal great vessels patent. Right carotid system: Mild atherosclerotic calcification right carotid bulb. Negative for right carotid stenosis. Left carotid system: Mild atherosclerotic calcification left carotid bulb. Negative for stenosis Vertebral arteries: Both vertebral arteries are patent to the skull base without stenosis. Atherosclerotic disease in the both vertebral arteries at the foramen magnum. Skeleton:  Negative Other neck: Negative Upper chest: Lung apices clear bilaterally. Review of the MIP images confirms the above findings CTA HEAD FINDINGS Anterior circulation: Atherosclerotic calcification throughout the cavernous carotid bilaterally without significant stenosis. Anterior and middle cerebral arteries patent bilaterally. No stenosis or large vessel occlusion. Negative for aneurysm. Posterior circulation: Atherosclerotic calcification of the distal vertebral artery bilaterally at the skull base. Moderate stenosis on the right and mild stenosis on the left. Left PICA patent. Right PICA not visualized. Right AICA patent. Basilar patent without stenosis. Superior cerebellar and posterior cerebral arteries patent bilaterally. Mild stenosis proximal posterior cerebral artery bilaterally. Fetal origin of the left posterior cerebral artery. Right posterior communicating artery patent. Venous sinuses: Normal venous enhancement Anatomic variants:  None Review of the MIP images confirms the above findings IMPRESSION: 1. Negative for intracranial large vessel occlusion or significant stenosis 2. Mild atherosclerotic disease in the carotid bifurcation bilaterally without stenosis 3. Moderate stenosis distal right vertebral artery mild stenosis distal left vertebral artery. Electronically Signed   By: Franchot Gallo M.D.   On: 08/12/2021 12:23      HISTORY OF PRESENT ILLNESS Steve Alvarado is a 66 y.o. male with history of CAD s/p stenting, HTN, HLD, DM and cocaine abuse presenting with acute onset left sided weakness resulting in a fall and left facial droop.  CT reveals no acute intracranial process, MRI pending.  TNK was given in the ED to treat his stroke.    HOSPITAL COURSE Steve Alvarado is a 66 y.o. male with history of CAD s/p stenting, HTN, HLD, DM and cocaine abuse presenting with acute onset left sided weakness resulting in a fall and left facial droop.  CT reveals no acute intracranial  process, MRI pending.  TNK was given in the ED to treat his stroke.    Stroke:  right subcortical infarct likely secondary due to small vessel disease, however cardioembolic source cannot be completed without given relatively larger size infarct CT head No acute abnormality. Small vessel disease. Atrophy. ASPECTS 10.    CTA head & neck negative for LVO, moderate bilateral distal vertebral artery stenosis, right more than left MRI  acute infarct in posterior right lentiform nucleus and corona radiata 2D Echo EF [id number], grade 1 diastolic dysfunction, mildly dilated left atrium, no atrial level shunt Recommend 30-day Cardiac monitoring as outpatient to rule out A-fib. LDL 60 HgbA1c 7.9 UDS negative this admission VTE prophylaxis -Lovenox No antithrombotic prior to admission, now on aspirin 81 mg daily and clopidogrel 75 mg daily DAPT for 3 weeks and then aspirin only. Therapy recommendations:  CIR Disposition:  long term care placement   Hypertension Home meds:  amlodipine 5 mg daily, losartan 50 mg daily, Coreg 3.125 twice daily, isosorbide 30 mg Stable on the high end Now on coreg 3.125, losartan 50 Add amlodipine 10 Long-term BP goal normotensive   Hyperlipidemia Home meds:  atorvastatin 40 mg daily, resumed in hospital LDL 60, goal < 70 Continue statin at discharge   Diabetes type II Uncontrolled Home meds:  metformin 1000 mg daily, insulin glargine 30 units daily, insulin lispro 3 units TID with meals HgbA1c 7.9, goal < 7.0 Now on insulin CBGs SSI Close PCP follow-up for better DM control   Other Stroke Risk Factors Advanced Age >/= 65  Coronary artery disease status post stenting History of cocaine abuse, last cocaine positive 10/2020   DISCHARGE EXAM Blood pressure (!) 157/82, pulse 66, temperature (!) 97.5 F (36.4 C), temperature source Oral, resp. rate 13, height 6\' 2"  (1.88 m), weight 89.2 kg, SpO2 100 %.   PHYSICAL EXAM General:  Alert, well-developed,  well-nourished middle-aged African-American male in no acute distress Respiratory:  Regular, unlabored respirations on room air   NEURO:  Mental Status: AA&Ox3  Speech/Language: speech has mild dysarthria   Cranial Nerves:  II: PERRL. Visual fields full.  III, IV, VI: EOMI. Eyelids elevate symmetrically.  V: Sensation is intact to light touch and symmetrical to face.  VII: Moderate left lower facial weakness and mild left eye closure weakness.  VIII: hearing intact to voice. IX, X: Phonation is normal.  XII: tongue is midline without fasciculations. Motor: 5/5 strength to RUE and RLE, 1/5 to LUE and 1/5 to  LLE.   Tone is increased on the left side and bulk is normal Sensation- Intact to light touch bilaterally.  Coordination: FTN intact on right Gait- deferred    Discharge Diet       Diet   Diet heart healthy/carb modified Room service appropriate? Yes with Assist; Fluid consistency: Thin   liquids  DISCHARGE PLAN Disposition:  SNF aspirin 81 mg daily and clopidogrel 75 mg daily for secondary stroke prevention for 3 weeks then Aspirin 81mg  alone. Ongoing stroke risk factor control by Primary Care Physician at time of discharge Follow-up PCP [name], Gladstone Pih, MD in 2 weeks. Follow-up in [facility] in 4 weeks, office to schedule an appointment.   35 minutes were spent preparing discharge.  Patient seen and examined by NP/APP with MD. MD to update note as needed.   Janine Ores, DNP, FNP-BC [facility] Pager: [phone]  ATTENDING NOTE: I reviewed above note and agree with the assessment and plan. Pt was seen and examined.   Patient no family at bedside.  Patient lying in bed, still has left hemiplegia with left UE 0/5 and LE 1+/5.  No acute event overnight.  Neuro stable for long-term care at discharge.  Continue DAPT for 3 weeks and then aspirin alone.  Continue statin on discharge.  Follow-up at [facility] with Dr.  Leonie Man.  For detailed assessment and plan, please refer to above as I have made changes wherever appropriate.   Rosalin Hawking, MD PhD Stroke Neurology 08/18/2021 6:13 PM

## 2021-08-17 NOTE — Progress Notes (Addendum)
STROKE TEAM PROGRESS NOTE   INTERVAL HISTORY Patient seen in room with no family at the bedside. He is eager to get out of the hospital. Working with social workers for placement/discharge. Discharge now planned for tomorrow.   Vitals:   08/17/21 0131 08/17/21 0400 08/17/21 0740 08/17/21 1235  BP: (!) 157/84 (!) 175/95 (!) 155/78 132/74  Pulse: 64 (!) 58 (!) 57 62  Resp: 12 (!) 9 17 17   Temp: 97.7 F (36.5 C) 98.8 F (37.1 C) (!) 97.5 F (36.4 C) 98.1 F (36.7 C)  TempSrc: Oral Oral Oral Oral  SpO2: 99% 99% 99% 100%  Weight:      Height:       CBC:  Recent Labs  Lab 08/12/21 1149 08/12/21 1157 08/16/21 0122 08/17/21 0023  WBC 5.3   < > 6.3 6.1  NEUTROABS 2.5  --   --   --   HGB 10.9*   < > 11.1* 11.0*  HCT 32.3*   < > 32.3* 31.8*  MCV 90.0   < > 86.1 86.9  PLT 218   < > 206 199   < > = values in this interval not displayed.    Basic Metabolic Panel:  Recent Labs  Lab 08/12/21 1502 08/14/21 0325 08/16/21 0122 08/17/21 0023  NA  --    < > 137 137  K  --    < > 3.8 3.9  CL  --    < > 109 108  CO2  --    < > 24 22  GLUCOSE  --    < > 102* 96  BUN  --    < > 19 23  CREATININE  --    < > 1.09 1.38*  CALCIUM  --    < > 9.2 9.1  MG 1.6*  --   --   --   PHOS 3.3  --   --   --    < > = values in this interval not displayed.    Lipid Panel:  Recent Labs  Lab 08/13/21 0544  CHOL 146  TRIG 99  HDL 66  CHOLHDL 2.2  VLDL 20  LDLCALC 60    HgbA1c:  Recent Labs  Lab 08/13/21 0544  HGBA1C 7.9*    Urine Drug Screen:  Recent Labs  Lab 08/13/21 0924  LABOPIA NONE DETECTED  COCAINSCRNUR NONE DETECTED  LABBENZ NONE DETECTED  AMPHETMU NONE DETECTED  THCU NONE DETECTED  LABBARB NONE DETECTED     Alcohol Level No results for input(s): ETH in the last 168 hours.  IMAGING past 24 hours No results found.  PHYSICAL EXAM General:  Alert, well-developed, well-nourished middle-aged African-American male in no acute distress Respiratory:  Regular, unlabored  respirations on room air  NEURO:  Mental Status: AA&Ox3  Speech/Language: speech has mild dysarthria  Cranial Nerves:  II: PERRL. Visual fields full.  III, IV, VI: EOMI. Eyelids elevate symmetrically.  V: Sensation is intact to light touch and symmetrical to face.  VII: Moderate left lower facial weakness and mild left eye closure weakness.  VIII: hearing intact to voice. IX, X: Phonation is normal.  XII: tongue is midline without fasciculations. Motor: 5/5 strength to RUE and RLE, 1/5 to LUE and 2/5 to LLE.   Tone is increased on the left sideand bulk is normal Sensation- Intact to light touch bilaterally.  Coordination: FTN intact on right Gait- deferred   ASSESSMENT/PLAN [name] is a 66 y.o. male with history of  CAD s/p stenting, HTN, HLD, DM and cocaine abuse presenting with acute onset left sided weakness resulting in a fall and left facial droop.  CT reveals no acute intracranial process, MRI pending.  TNK was given in the ED to treat his stroke.  Symptoms did not improve much after TNK. Currently awaiting discharge to SNF.   Stroke:  right subcortical infarct likely secondary due to small vessel disease, however cardioembolic source cannot be completed without given relatively larger size infarct CT head No acute abnormality. Small vessel disease. Atrophy. ASPECTS 10.    CTA head & neck negative for LVO, moderate bilateral distal vertebral artery stenosis, right more than left MRI  acute infarct in posterior right lentiform nucleus and corona radiata 2D Echo EF [id number], grade 1 diastolic dysfunction, mildly dilated left atrium, no atrial level shunt Recommend 30-day Cardiac monitoring as outpatient to rule out A-fib. LDL 60 HgbA1c 7.9 UDS negative this admission VTE prophylaxis -Lovenox No antithrombotic prior to admission, now on aspirin 81 mg daily and clopidogrel 75 mg daily DAPT for 3 weeks and then aspirin only. Therapy recommendations:  CIR vs  SNF Disposition:  pending  Hypertension Home meds:  amlodipine 5 mg daily, losartan 50 mg daily, Coreg 3.125 twice daily, isosorbide 30 mg Stable on the high end Now on coreg 3.125, losartan 50 Add amlodipine 10 Long-term BP goal normotensive  Hyperlipidemia Home meds:  atorvastatin 40 mg daily, resumed in hospital LDL 60, goal < 70 Continue statin at discharge  Diabetes type II Uncontrolled Home meds:  metformin 1000 mg daily, insulin glargine 30 units daily, insulin lispro 3 units TID with meals HgbA1c 7.9, goal < 7.0 Now on insulin Overnight hypoglycemic event, DM coordinator on board, decrease long-term insulin dose. CBGs SSI Close PCP follow-up for better DM control  Other Stroke Risk Factors Advanced Age >/= 65  Coronary artery disease status post stenting History of cocaine abuse, last cocaine positive 10/2020  Other Active Problems none  Hospital day # 5  Patient seen and examined by NP/APP with MD. MD to update note as needed.   Janine Ores, DNP, FNP-BC [facility] Pager: [phone]  ATTENDING NOTE: I reviewed above note and agree with the assessment and plan. Pt was seen and examined.   No family at bedside.  Patient lying in bed, still has left hemiplegia.  PT therapy recommended SNF.  Currently pending long-term care placement.  Had a hypoglycemic event this morning, patient stated that he did not eat well last night.  Encourage p.o. intake, if lack of appetite, educated to report to nurse to hold off pre-meal insulin.  He expressed understanding.  Diabetic coordinator also on board, decreased longstanding insulin.  Plan for discharge tomorrow.  Continue current management.  For detailed assessment and plan, please refer to above as I have made changes wherever appropriate.   Rosalin Hawking, MD PhD Stroke Neurology 08/17/2021 6:17 PM     To contact Stroke Continuity provider, please refer to [email or web address] After hours, contact General  Neurology

## 2021-08-18 LAB — CBC
HCT: 34.7 % — ABNORMAL LOW (ref 39.0–52.0)
Hemoglobin: 11.9 g/dL — ABNORMAL LOW (ref 13.0–17.0)
MCH: 29.6 pg (ref 26.0–34.0)
MCHC: 34.3 g/dL (ref 30.0–36.0)
MCV: 86.3 fL (ref 80.0–100.0)
Platelets: 235 10*3/uL (ref 150–400)
RBC: 4.02 MIL/uL — ABNORMAL LOW (ref 4.22–5.81)
RDW: 12.3 % (ref 11.5–15.5)
WBC: 6.5 10*3/uL (ref 4.0–10.5)
nRBC: 0 % (ref 0.0–0.2)

## 2021-08-18 LAB — BASIC METABOLIC PANEL
Anion gap: 6 (ref 5–15)
BUN: 25 mg/dL — ABNORMAL HIGH (ref 8–23)
CO2: 23 mmol/L (ref 22–32)
Calcium: 9.4 mg/dL (ref 8.9–10.3)
Chloride: 108 mmol/L (ref 98–111)
Creatinine, Ser: 1.25 mg/dL — ABNORMAL HIGH (ref 0.61–1.24)
GFR, Estimated: >60 mL/min (ref >60)
Glucose, Bld: 85 mg/dL (ref 70–99)
Potassium: 4 mmol/L (ref 3.5–5.1)
Sodium: 137 mmol/L (ref 135–145)

## 2021-08-18 LAB — GLUCOSE, CAPILLARY
Glucose-Capillary: 87 mg/dL (ref 70–99)
Glucose-Capillary: 113 mg/dL — ABNORMAL HIGH (ref 70–99)

## 2021-08-18 MED ORDER — AMLODIPINE BESYLATE 10 MG PO TABS: 10.0000 mg | ORAL_TABLET | Freq: Every morning | ORAL | 0 refills | Status: AC

## 2021-08-18 NOTE — TOC Transition Note (Addendum)
Transition of Care [facility]) - CM/SW Discharge Note   Patient Details  Name: Steve Alvarado MRN: [id number] Date of Birth: Dec 09, 1955  [facility]) CM/SW Contact:  Benard Halsted, LCSW Phone Number: 08/18/2021, 1:58 PM   Clinical Narrative:    Patient will DC to: [facility] Anticipated DC date: 08/18/21 Family notified: Pt notified his wife Transport by: Corey Harold   Per MD patient ready for DC to [facility]. RN to call report prior to discharge ([phone], room 329A). RN, patient, patient's family, and facility notified of DC. Discharge Summary and FL2 sent to facility. DC packet on chart. Ambulance transport requested for patient.   CSW completed appeal with [facility] for patient to receive therapies at SNF (Ref# [phone]). Review can take up to 72 hours.  CSW will sign off for now as social work intervention is no longer needed. Please consult [place] again if new needs arise.     Final next level of care: Skilled Nursing Facility Barriers to Discharge: Barriers Resolved   Patient Goals and CMS Choice Patient states their goals for this hospitalization and ongoing recovery are:: Rehab CMS Medicare.gov Compare Post Acute Care list provided to:: Patient Choice offered to / list presented to : Patient, Adult Children, Spouse  Discharge Placement   Existing PASRR number confirmed : 08/18/21          Patient chooses bed at: [facility] Patient to be transferred to facility by: [place] Name of family member notified: Spouse Patient and family notified of of transfer: 08/18/21  Discharge Plan and Services In-house Referral: Clinical Social Work   Post Acute Care Choice: [facility]                               Social Determinants of Health (SDOH) Interventions     Readmission Risk Interventions     View : No data to display.

## 2021-08-18 NOTE — Progress Notes (Signed)
Inpatient Diabetes Program Recommendations  AACE/ADA: New Consensus Statement on Inpatient Glycemic Control   Target Ranges:  Prepandial:   less than 140 mg/dL      Peak postprandial:   less than 180 mg/dL (1-2 hours)      Critically ill patients:  140 - 180 mg/dL     Latest Reference Range & Units 08/17/21 07:43 08/17/21 11:56 08/17/21 15:54  08/17/21 20:53 08/18/21 07:35  Glucose-Capillary 70 - 99 mg/dL 79 100 (H) 130 (H)  Novolog 2 units     Semglee 10 units  87 87   Review of Glycemic Control  Diabetes history: DM2 Outpatient Diabetes medications: Lantus 30 units Q12H, Humalog 3 units TID with meals, Metformin 1000 mg daily Current orders for Inpatient glycemic control: Semglee 10 units QHS, Novolog 0-15 units TID with meals  Inpatient Diabetes Program Recommendations:    Insulin: Patient only received Semglee one time on 08/17/21 and glucose 87 mg/dl this morning. May want to consider decreasing Semglee to 8 units QHS.  Thanks, Barnie Alderman, RN, MSN, [employer] [facility] Coordinator Inpatient Diabetes Program [phone] (Team Pager from 8am to [place])

## 2021-08-18 NOTE — Progress Notes (Signed)
Physical Therapy Treatment Patient Details Name: Steve Alvarado MRN: [id number] DOB: 20-Sep-1955 Today's Date: 08/18/2021   History of Present Illness 66 y.o. male presents to [facility] on 08/12/2021 with L weakness and facial droop. CTH negative. Pt received TNK. 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    PT Comments    Patient significantly better with sitting balance this date. Continues with left lateral lean, however is able to recognize and self-correct to midline with minguard assist. Maintains midline until distracted and loses balance again to his left. Noted incr muscle tone in LUE (flexor) and incr muscle activation in LLE (hip adductors/abductors/extensors all with at least 1+/5.     Recommendations for follow up therapy are one component of a multi-disciplinary discharge planning process, led by the attending physician.  Recommendations may be updated based on patient status, additional functional criteria and insurance authorization.  Follow Up Recommendations  Skilled nursing-short term rehab (<3 hours/day)     Assistance Recommended at Discharge Intermittent Supervision/Assistance  Patient can return home with the following Two people to help with walking and/or transfers;Two people to help with bathing/dressing/bathroom;Assistance with cooking/housework;Assistance with feeding;Direct supervision/assist for medications management;Direct supervision/assist for financial management;Assist for transportation;Help with stairs or ramp for entrance   Equipment Recommendations  Wheelchair (measurements PT);BSC/3in1;Other (comment) (hoyer lift)    Recommendations for Other Services       Precautions / Restrictions Precautions Precautions: Fall Restrictions Weight Bearing Restrictions: No     Mobility  Bed Mobility Overal bed mobility: Needs  Assistance Bed Mobility: Rolling, Sidelying to Sit, Sit to Sidelying Rolling: Min assist (min A to roll toward R side) Sidelying to sit: Mod assist, HOB elevated (HOB20)     Sit to sidelying: Mod assist General bed mobility comments: up from rt side with incr time and assist with legs; return to side with pt controlling trunk and assist with legs    Transfers Overall transfer level: Needs assistance   Transfers: Sit to/from Stand             General transfer comment: unable to stand with +1 assist attempted x 2    Ambulation/Gait                   Stairs             Wheelchair Mobility    Modified Rankin (Stroke Patients Only) Modified Rankin (Stroke Patients Only) Pre-Morbid Rankin Score: Moderately severe disability (recently in [facility]) Modified Rankin: Severe disability     Balance Overall balance assessment: Needs assistance Sitting-balance support: Single extremity supported, Feet supported Sitting balance-Leahy Scale: Poor Sitting balance - Comments: no pushing with RUE this date; able to correct his balance to midline from left lean and maintain with minguard assist for up to 2 minutes; when pt distracted, he again loses his balance to his left. Worked on leaning down onto left and right elbow with pt returning his torso to midline with minguard assist. Worked on trunk mobility, including rotation. Postural control: Left lateral lean                                  Cognition Arousal/Alertness: Awake/alert Behavior During Therapy: WFL for tasks assessed/performed Overall Cognitive Status: Within Functional Limits for tasks assessed  Awareness: Emergent   General Comments: pt with impaired awareness of balance deficits, unable to correct balance deficits at this time        Exercises Other Exercises Other Exercises: PROM LUE as noted incr flexor tone; able to achieve  neutral with elbow, tightness in internal rotators Other Exercises: supine AAROM LLE with pt able to activate hip abd/adduction/extension; repeated each motion x 5 reps    General Comments        Pertinent Vitals/Pain Pain Assessment Pain Assessment: Faces Faces Pain Scale: Hurts little more Pain Location: hands Pain Descriptors / Indicators: Aching Pain Intervention(s): Limited activity within patient's tolerance, Monitored during session    Home Living                          Prior Function            PT Goals (current goals can now be found in the care plan section) Acute Rehab PT Goals Patient Stated Goal: to improve strength and reduce falls risk PT Goal Formulation: With patient Time For Goal Achievement: 08/27/21 Potential to Achieve Goals: Fair Progress towards PT goals: Progressing toward goals    Frequency    Min 3X/week      PT Plan Discharge plan needs to be updated;Frequency needs to be updated    Co-evaluation              AM-PAC PT "6 Clicks" Mobility   Outcome Measure  Help needed turning from your back to your side while in a flat bed without using bedrails?: A Lot Help needed moving from lying on your back to sitting on the side of a flat bed without using bedrails?: Total Help needed moving to and from a bed to a chair (including a wheelchair)?: Total Help needed standing up from a chair using your arms (e.g., wheelchair or bedside chair)?: Total Help needed to walk in [facility]ado room?: Total Help needed climbing 3-5 steps with a railing? : Total 6 Click Score: 7    End of Session Equipment Utilized During Treatment: Gait belt Activity Tolerance: Patient tolerated treatment well Patient left: with call bell/phone within reach;in bed;with bed alarm set   PT Visit Diagnosis: Other abnormalities of gait and mobility (R26.89);Hemiplegia and hemiparesis Hemiplegia - Right/Left: Left Hemiplegia - dominant/non-dominant:  Non-dominant Hemiplegia - caused by: Cerebral infarction     Time: 2878-6767 PT Time Calculation (min) (ACUTE ONLY): 28 min  Charges:  $Neuromuscular Re-education: 23-37 mins                      Jerolyn Center, PT Acute Rehabilitation Services  Pager [phone] Office [phone]    [name] 08/18/2021, 11:18 AM

## 2021-08-19 ENCOUNTER — Inpatient Hospital Stay (HOSPITAL_COMMUNITY)
Admission: EM | Admit: 2021-08-19 | Discharge: 2021-08-25 | DRG: 683 | Disposition: A | Discharge status: Skilled Nursing Facility | Payer: Medicare Other | Source: Skilled Nursing Facility | Attending: Internal Medicine | Admitting: Internal Medicine

## 2021-08-19 ENCOUNTER — Emergency Department (HOSPITAL_COMMUNITY): Payer: Medicare Other

## 2021-08-19 DIAGNOSIS — I6381 Other cerebral infarction due to occlusion or stenosis of small artery: Secondary | ICD-10-CM | POA: Diagnosis present

## 2021-08-19 DIAGNOSIS — R4781 Slurred speech: Secondary | ICD-10-CM | POA: Diagnosis present

## 2021-08-19 DIAGNOSIS — E1142 Type 2 diabetes mellitus with diabetic polyneuropathy: Secondary | ICD-10-CM | POA: Diagnosis present

## 2021-08-19 DIAGNOSIS — Z79890 Hormone replacement therapy (postmenopausal): Secondary | ICD-10-CM

## 2021-08-19 DIAGNOSIS — Z8249 Family history of ischemic heart disease and other diseases of the circulatory system: Secondary | ICD-10-CM

## 2021-08-19 DIAGNOSIS — E785 Hyperlipidemia, unspecified: Secondary | ICD-10-CM | POA: Diagnosis present

## 2021-08-19 DIAGNOSIS — Z888 Allergy status to other drugs, medicaments and biological substances status: Secondary | ICD-10-CM

## 2021-08-19 DIAGNOSIS — R531 Weakness: Secondary | ICD-10-CM | POA: Diagnosis not present

## 2021-08-19 DIAGNOSIS — Z7984 Long term (current) use of oral hypoglycemic drugs: Secondary | ICD-10-CM

## 2021-08-19 DIAGNOSIS — I10 Essential (primary) hypertension: Secondary | ICD-10-CM | POA: Diagnosis present

## 2021-08-19 DIAGNOSIS — Z7151 Drug abuse counseling and surveillance of drug abuser: Secondary | ICD-10-CM

## 2021-08-19 DIAGNOSIS — Z7401 Bed confinement status: Secondary | ICD-10-CM

## 2021-08-19 DIAGNOSIS — B9620 Unspecified Escherichia coli [E. coli] as the cause of diseases classified elsewhere: Secondary | ICD-10-CM | POA: Diagnosis present

## 2021-08-19 DIAGNOSIS — N390 Urinary tract infection, site not specified: Secondary | ICD-10-CM | POA: Diagnosis present

## 2021-08-19 DIAGNOSIS — R29810 Facial weakness: Secondary | ICD-10-CM | POA: Diagnosis present

## 2021-08-19 DIAGNOSIS — Z7902 Long term (current) use of antithrombotics/antiplatelets: Secondary | ICD-10-CM

## 2021-08-19 DIAGNOSIS — Z955 Presence of coronary angioplasty implant and graft: Secondary | ICD-10-CM

## 2021-08-19 DIAGNOSIS — I69354 Hemiplegia and hemiparesis following cerebral infarction affecting left non-dominant side: Secondary | ICD-10-CM

## 2021-08-19 DIAGNOSIS — E039 Hypothyroidism, unspecified: Secondary | ICD-10-CM | POA: Diagnosis present

## 2021-08-19 DIAGNOSIS — I2510 Atherosclerotic heart disease of native coronary artery without angina pectoris: Secondary | ICD-10-CM | POA: Diagnosis present

## 2021-08-19 DIAGNOSIS — N189 Chronic kidney disease, unspecified: Secondary | ICD-10-CM | POA: Diagnosis present

## 2021-08-19 DIAGNOSIS — K219 Gastro-esophageal reflux disease without esophagitis: Secondary | ICD-10-CM | POA: Diagnosis present

## 2021-08-19 DIAGNOSIS — Z79899 Other long term (current) drug therapy: Secondary | ICD-10-CM

## 2021-08-19 DIAGNOSIS — N179 Acute kidney failure, unspecified: Principal | ICD-10-CM | POA: Diagnosis present

## 2021-08-19 DIAGNOSIS — N1831 Chronic kidney disease, stage 3a: Secondary | ICD-10-CM | POA: Diagnosis present

## 2021-08-19 DIAGNOSIS — F1410 Cocaine abuse, uncomplicated: Secondary | ICD-10-CM | POA: Diagnosis present

## 2021-08-19 DIAGNOSIS — Z794 Long term (current) use of insulin: Secondary | ICD-10-CM

## 2021-08-19 DIAGNOSIS — Z7982 Long term (current) use of aspirin: Secondary | ICD-10-CM

## 2021-08-19 LAB — I-STAT CHEM 8, ED
BUN: 48 mg/dL — ABNORMAL HIGH (ref 8–23)
Calcium, Ion: 1.06 mmol/L — ABNORMAL LOW (ref 1.15–1.40)
Chloride: 108 mmol/L (ref 98–111)
Creatinine, Ser: 3.4 mg/dL — ABNORMAL HIGH (ref 0.61–1.24)
Glucose, Bld: 105 mg/dL — ABNORMAL HIGH (ref 70–99)
HCT: 31 % — ABNORMAL LOW (ref 39.0–52.0)
Hemoglobin: 10.5 g/dL — ABNORMAL LOW (ref 13.0–17.0)
Potassium: 4.4 mmol/L (ref 3.5–5.1)
Sodium: 139 mmol/L (ref 135–145)
TCO2: 21 mmol/L — ABNORMAL LOW (ref 22–32)

## 2021-08-19 LAB — CBG MONITORING, ED: Glucose-Capillary: 106 mg/dL — ABNORMAL HIGH (ref 70–99)

## 2021-08-19 IMAGING — CT CT HEAD CODE STROKE
4 series · 15 of 47 positions shown, 17 images · non-contrast
Comparison: Prior MRI from [DATE].

CLINICAL DATA: Code stroke. Initial evaluation for neuro deficit,
stroke suspected.



[Series 3: head 5.0 st · axial · 0.49mm/px · z∈[-156,-20]mm · 7 of 37 slices shown, 9 images]
[im 5/37  brain]
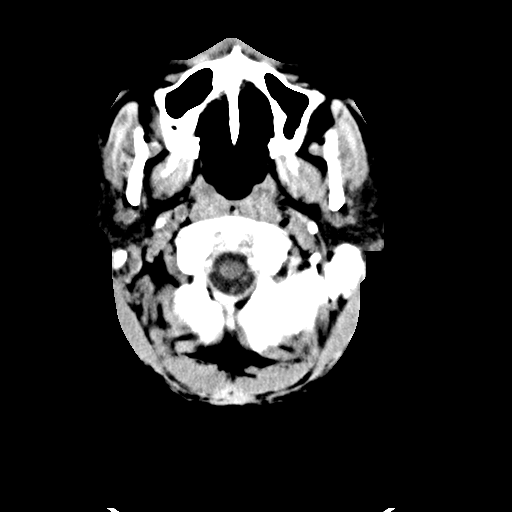
[im 5/37  bone]
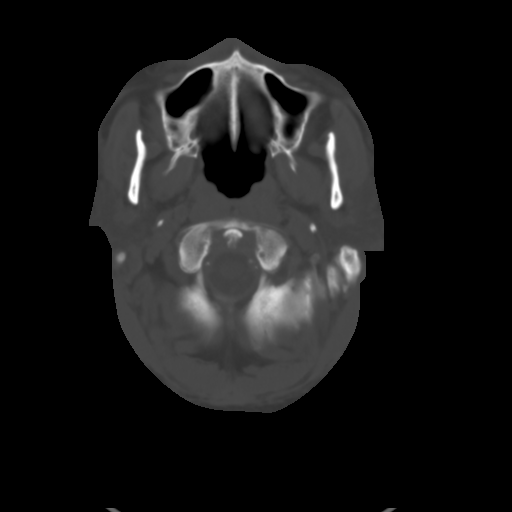
[im 10/37  brain]
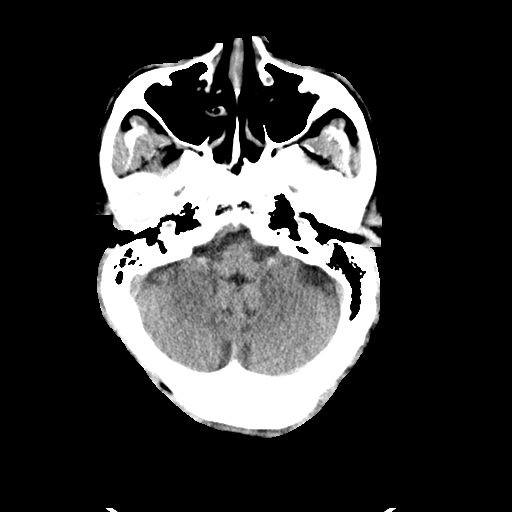
[im 14/37  brain]
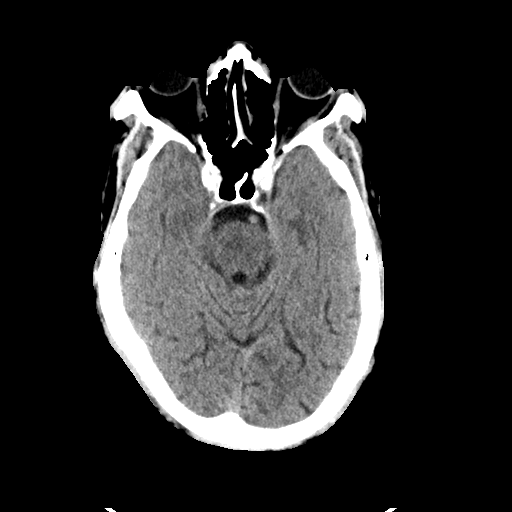
[im 19/37  brain]
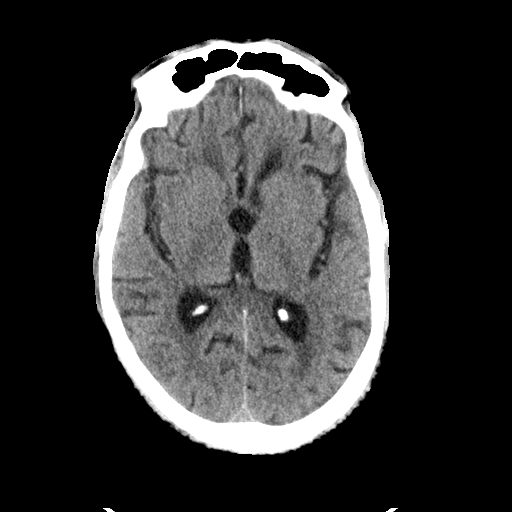
[im 23/37  brain]
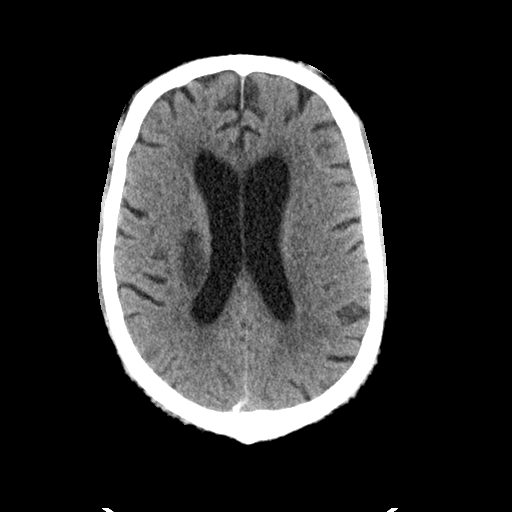
[im 23/37  bone]
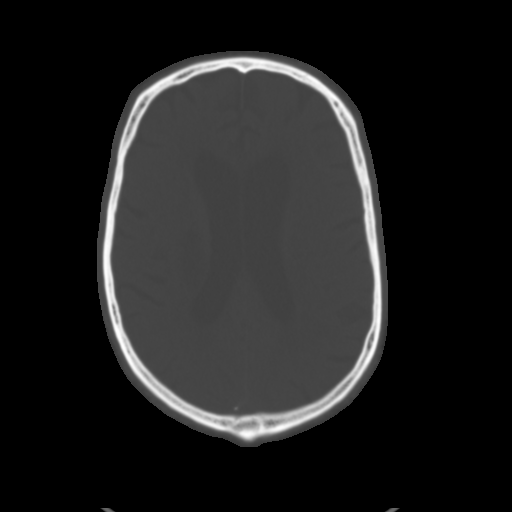
[im 28/37  brain]
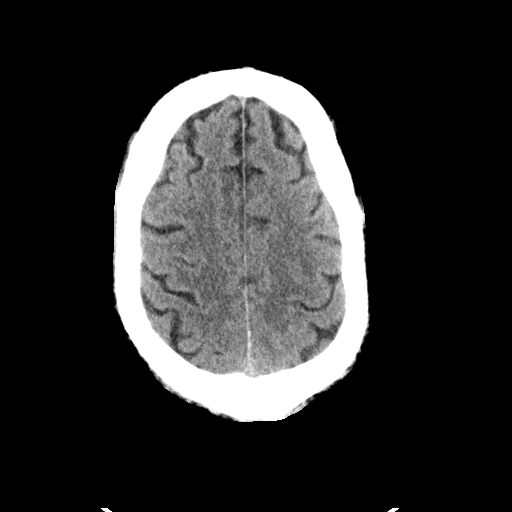
[im 32/37  brain]
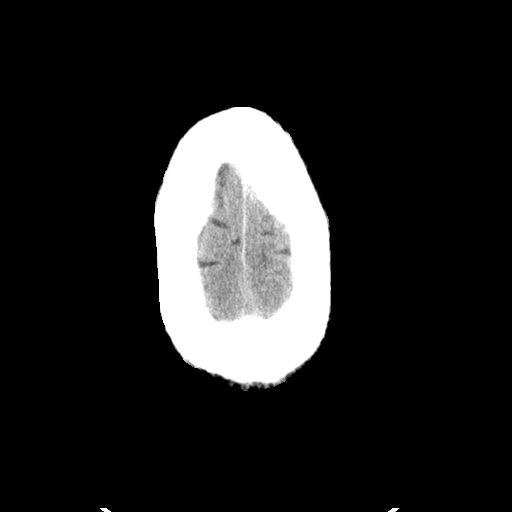

[Series 4: head 2.0 bone · axial · 0.49mm/px · z∈[-158,-140]mm · 2 of 92 slices shown]
[im 10/92  bone]
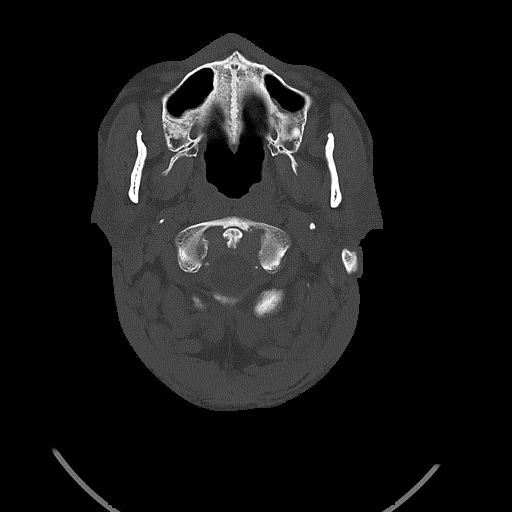
[im 19/92  bone]
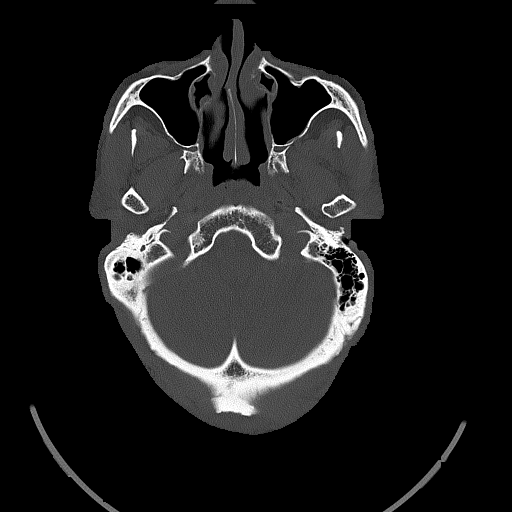

[Series 5: head 3.0 cor st · coronal · 0.36mm/px · 3 of 76 slices shown]
[im 26/76  brain]
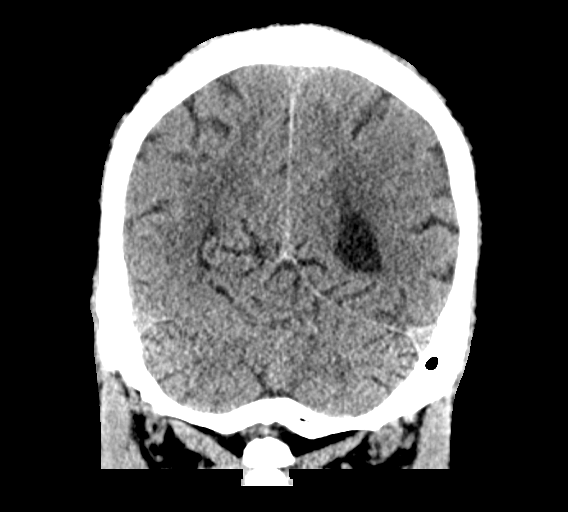
[im 34/76  brain]
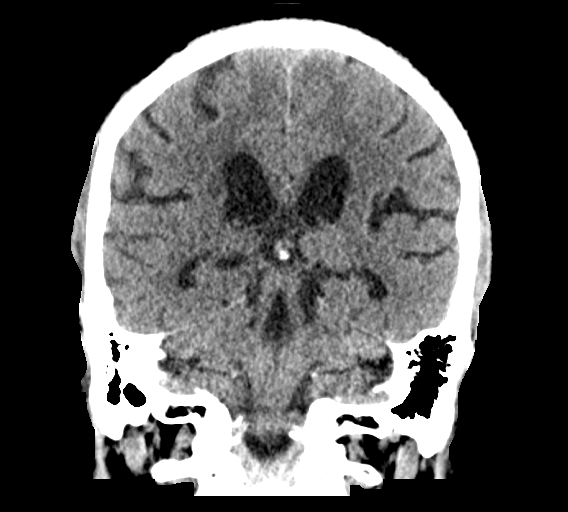
[im 42/76  brain]
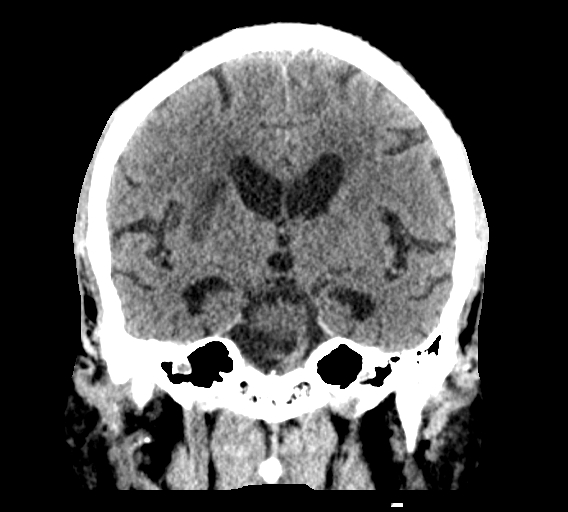

[Series 6: head 3.0 sag st · sagittal · 0.38mm/px · 3 of 67 slices shown]
[im 23/67  brain]
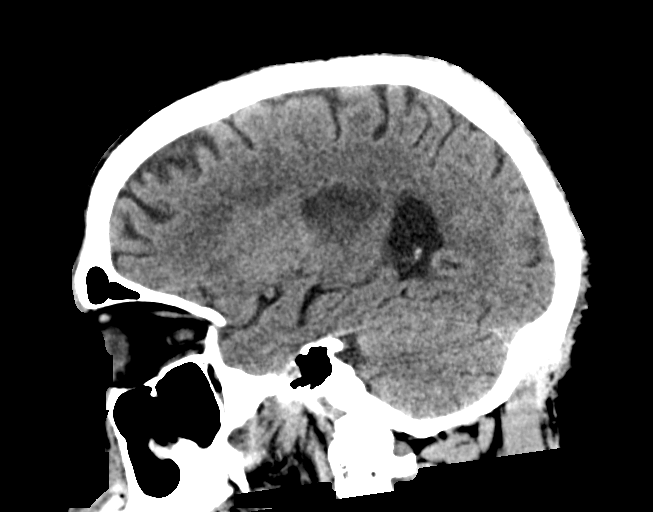
[im 34/67  brain]
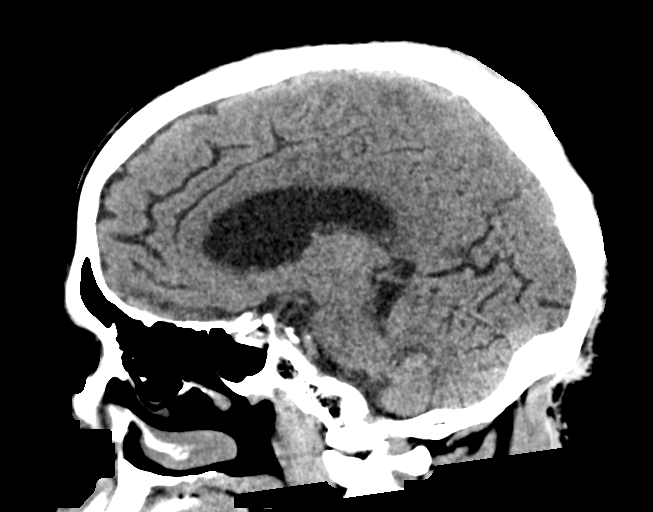
[im 45/67  brain]
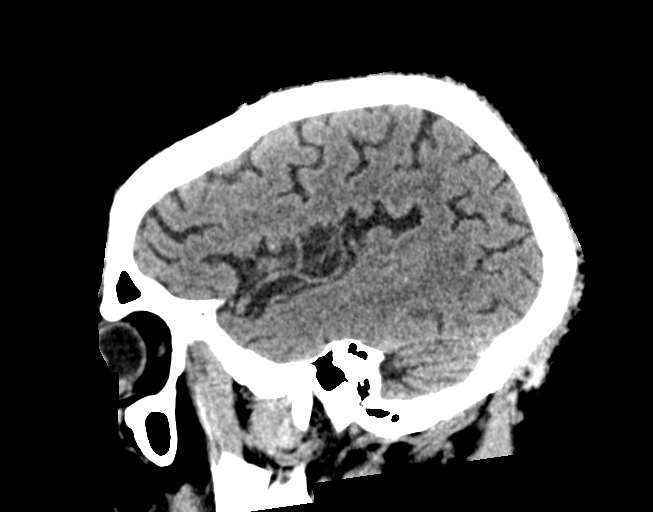

[15 of 47 positions shown; findings below may reference images not displayed]

FINDINGS: Brain: Normal expected interval evolution of recently identified
infarct involving the posterior right basal ganglia, relatively
stable in size and distribution as compared to prior MRI. No
significant regional mass effect or evidence for hemorrhagic
transformation.

No other new large vessel territory infarct. No other acute
intracranial hemorrhage. Underlying atrophy with chronic
microvascular ischemic disease with a few small remote cerebellar
infarcts noted. No mass lesion or midline shift. No hydrocephalus or
extra-axial fluid collection.

Vascular: No hyperdense vessel. Calcified atherosclerosis present at
skull base.

Skull: Scalp soft tissues and calvarium demonstrate no acute
finding.

Sinuses/Orbits: Globes orbital soft tissues within normal limits.
Mild mucosal thickening present about the maxillary sinuses.
Paranasal sinuses are otherwise largely clear. No significant
mastoid effusion.

Other: None.

ASPECTS (Alberta Stroke Program Early CT Score)

- Ganglionic level infarction (caudate, lentiform nuclei, internal
capsule, insula, M1-M3 cortex): 7

- Supraganglionic infarction (M4-M6 cortex): 3

Total score (0-10 with 10 being normal): 10
IMPRESSION: 1. Normal expected interval evolution of previously identified
subacute infarct involving the right basal ganglia. No evidence for
hemorrhagic transformation or significant mass effect.
2. No other new acute intracranial abnormality.
3. ASPECTS is 10.
4. Underlying atrophy with chronic small vessel ischemic disease
with a few small remote cerebellar infarcts.

These results were communicated to Dr. LENIN ALEJANDRO at [DATE] on
[DATE] by text page via the [employer] messaging system.

## 2021-08-19 MED ORDER — SODIUM CHLORIDE 0.9% FLUSH: 3.0000 mL | Freq: Once | INTRAVENOUS | Status: DC

## 2021-08-19 NOTE — ED Triage Notes (Signed)
Pt via [facility] from SNF as Code Stroke- LSN 1200 "at lunch." Pt w deficits of L sided facial droop, slurred speech. Hx of HTN, CVA, recently admitted for same. Pt A&O4 on arrival to ED  22g R wrist 120/84

## 2021-08-19 NOTE — Code Documentation (Signed)
Responded to Code Stroke called at 2313 for L sided facial droop and slurred speech, LSN-1200. Pt arrived at 2336, CBG-106, NIH-9, CT head-negative for acute changes. TNK not given-outside window. Plan for MRI.

## 2021-08-19 NOTE — ED Provider Notes (Signed)
[facility] EMERGENCY DEPARTMENT Provider Note   CSN: [id number] Arrival date & time: 08/19/21  2335  An emergency department physician performed an initial assessment on this suspected stroke patient at 2336.  History  Chief Complaint  Patient presents with   Code Stroke    ALEJANDRO GAMEL is a 66 y.o. male.  The history is provided by the patient and medical records.   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  Facility reported that around noon while eating lunch he stopped moving his left arm and was drooling.  On arrival he is AAOx3, talkative and pleasant with staff.  Home Medications Prior to Admission medications   Medication Sig Start Date End Date Taking? Authorizing Provider  amLODipine (NORVASC) 10 MG tablet Take 1 tablet (10 mg total) by mouth every morning. 08/19/21   de Saintclair Halsted, Cortney E, NP  ASPERCREME LIDOCAINE 4 % Place 1 patch onto the skin See admin instructions. Apply one patch topically to left abdomen every morning - leave on for 12 hours 07/12/21   [provider]  aspirin EC 81 MG tablet Take 1 tablet (81 mg total) by mouth daily. Swallow whole. 08/18/21   Elmer Picker, NP  atorvastatin (LIPITOR) 40 MG tablet Take 1 tablet (40 mg total) by mouth every evening. 10/23/20 08/12/21  Delfino Lovett, MD  carvedilol (COREG) 3.125 MG tablet Take 1 tablet (3.125 mg total) by mouth 2 (two) times daily with a meal. Patient taking differently: Take 3.125 mg by mouth every 12 (twelve) hours. 10/23/20 08/12/21  Delfino Lovett, MD  citalopram (CELEXA) 20 MG tablet Take 1 tablet (20 mg total) by mouth daily. Patient taking differently: Take 20 mg by mouth every morning. 10/23/20 08/12/21  Delfino Lovett, MD  clopidogrel (PLAVIX) 75 MG tablet Take 1 tablet (75 mg total) by mouth daily. 08/18/21   Elmer Picker, NP   gabapentin (NEURONTIN) 400 MG capsule Take 400 mg by mouth 3 (three) times daily. 8am, 12pm 4pm 06/03/21   [provider]  insulin glargine-yfgn (SEMGLEE) 100 UNIT/ML injection Inject 0.1 mLs (10 Units total) into the skin at bedtime. 08/17/21   Elmer Picker, NP  insulin lispro (HUMALOG) 100 UNIT/ML injection Inject 0.03 mLs (3 Units total) into the skin 3 (three) times daily with meals. 10/23/20   Delfino Lovett, MD  levothyroxine (SYNTHROID) 75 MCG tablet Take 75 mcg by mouth See admin instructions. Take one tablet (75 mcg) by mouth daily at 10pm 05/19/21   [provider]  losartan (COZAAR) 50 MG tablet Take 50 mg by mouth every morning. 05/19/21   [provider]  metFORMIN (GLUCOPHAGE) 1000 MG tablet Take 1,000 mg by mouth 2 (two) times daily. 05/19/21   [provider]  pantoprazole (PROTONIX) 40 MG tablet Take 40 mg by mouth every morning. 05/19/21   [provider]      Allergies    Ace inhibitors    Review of Systems   Review of Systems  Neurological:  Positive for weakness.  All other systems reviewed and are negative.  Physical Exam Updated Vital Signs BP 133/69   Pulse 71   Temp 98 F (36.7 C) (Oral)   Resp 12   SpO2 96%   Physical Exam Vitals and nursing note reviewed.  Constitutional:      Appearance: He is well-developed.  HENT:  Head: Normocephalic and atraumatic.  Eyes:     Conjunctiva/sclera: Conjunctivae normal.     Pupils: Pupils are equal, round, and reactive to light.     Comments: Eye upper eyelid is droopy but occasionally does raise and fully opens eye, denies visual defecit  Cardiovascular:     Rate and Rhythm: Normal rate and regular rhythm.     Heart sounds: Normal heart sounds.  Pulmonary:     Effort: Pulmonary effort is normal.     Breath sounds: Normal breath sounds.  Abdominal:     General: Bowel sounds are normal.     Palpations: Abdomen is soft.  Musculoskeletal:        General: Normal range of  motion.     Cervical back: Normal range of motion.  Skin:    General: Skin is warm and dry.  Neurological:     Mental Status: He is alert and oriented to person, place, and time.     Comments: AAOx3, talkative with staff, able to answer questions appropriately, left sided weakness compared with right    ED Results / Procedures / Treatments   Labs (all labs ordered are listed, but only abnormal results are displayed) Labs Reviewed  CBC - Abnormal; Notable for the following components:      Result Value   RBC 3.68 (*)    Hemoglobin 10.9 (*)    HCT 33.8 (*)    All other components within normal limits  DIFFERENTIAL - Abnormal; Notable for the following components:   Eosinophils Absolute 0.6 (*)    All other components within normal limits  COMPREHENSIVE METABOLIC PANEL - Abnormal; Notable for the following components:   CO2 20 (*)    Glucose, Bld 108 (*)    BUN 50 (*)    Creatinine, Ser 2.98 (*)    GFR, Estimated 22 (*)    All other components within normal limits  URINALYSIS, ROUTINE W REFLEX MICROSCOPIC - Abnormal; Notable for the following components:   Color, Urine AMBER (*)    APPearance CLOUDY (*)    Protein, ur 100 (*)    Leukocytes,Ua SMALL (*)    Bacteria, UA RARE (*)    All other components within normal limits  I-STAT CHEM 8, ED - Abnormal; Notable for the following components:   BUN 48 (*)    Creatinine, Ser 3.40 (*)    Glucose, Bld 105 (*)    Calcium, Ion 1.06 (*)    TCO2 21 (*)    Hemoglobin 10.5 (*)    HCT 31.0 (*)    All other components within normal limits  CBG MONITORING, ED - Abnormal; Notable for the following components:   Glucose-Capillary 106 (*)    All other components within normal limits  URINE CULTURE  PROTIME-INR  APTT    EKG EKG Interpretation  Date/Time:  Thursday August 19 2021 23:56:55 EDT Ventricular Rate:  70 PR Interval:  270 QRS Duration: 95 QT Interval:  416 QTC Calculation: 449 R Axis:   39 Text Interpretation: Sinus  rhythm Prolonged PR interval Borderline repolarization abnormality No significant change since 08/12/2021 Confirmed by Geoffery Lyons ([id number]) on 08/19/2021 11:59:51 PM  Radiology MR BRAIN WO CONTRAST  Result Date: 08/20/2021 CLINICAL DATA:  Initial evaluation for neuro deficit, stroke suspected. EXAM: MRI HEAD WITHOUT CONTRAST TECHNIQUE: Multiplanar, multiecho pulse sequences of the brain and surrounding structures were obtained without intravenous contrast. COMPARISON:  CT from 08/19/2021 and brain MRI from 08/13/2021. FINDINGS: Brain: Cerebral volume within normal limits. Chronic microvascular  ischemic disease involving the periventricular deep white matter both cerebral hemispheres as well as the pons noted, stable. Few scattered small remote bilateral cerebellar infarcts again noted. Small remote lacunar infarct present at the left thalamus. There has been continued interval evolution of recently identified ischemic infarct involving the posterior right basal ganglia, not significantly changed in size or morphology as compared to previous. No associated hemorrhage or significant regional mass effect. No evidence for significant interval expansion or new infarction. No other acute or subacute ischemic changes elsewhere within the brain. Gray-white matter differentiation otherwise maintained. No acute intracranial hemorrhage. Single punctate chronic microhemorrhage noted within the right frontal centrum semi ovale, likely small vessel related. No mass lesion, midline shift or mass effect. No hydrocephalus or extra-axial fluid collection. Pituitary gland suprasellar region within normal limits. Vascular: Major intracranial vascular flow voids are maintained. Skull and upper cervical spine: Craniocervical junction normal. Bone mineral. No scalp soft tissue abnormality. Sinuses/Orbits: Globes orbital soft tissues within normal limits. Scattered mucosal thickening noted throughout the paranasal sinuses. Small right  greater than left mastoid effusions. Visualized nasopharynx within normal limits. Other: None. IMPRESSION: 1. Continued interval evolution of recently identified right basal ganglia ischemic infarct, not significantly changed in size or morphology as compared to previous. No associated hemorrhage or significant regional mass effect. 2. No other new acute intracranial abnormality. 3. Underlying chronic microvascular ischemic disease with a few small remote left thalamic and cerebellar infarcts, stable. Electronically Signed   By: Rise Mu M.D.   On: 08/20/2021 03:15   DG Chest Port 1 View  Result Date: 08/20/2021 CLINICAL DATA:  Stroke. EXAM: PORTABLE CHEST 1 VIEW COMPARISON:  Chest x-ray 07/08/2021 FINDINGS: The heart size and mediastinal contours are within normal limits. Both lungs are clear. The visualized skeletal structures are unremarkable. IMPRESSION: No active disease. Electronically Signed   By: Darliss Cheney M.D.   On: 08/20/2021 00:19   CT HEAD CODE STROKE WO CONTRAST  Result Date: 08/19/2021 CLINICAL DATA:  Code stroke. Initial evaluation for neuro deficit, stroke suspected. EXAM: CT HEAD WITHOUT CONTRAST TECHNIQUE: Contiguous axial images were obtained from the base of the skull through the vertex without intravenous contrast. RADIATION DOSE REDUCTION: This exam was performed according to the departmental dose-optimization program which includes automated exposure control, adjustment of the mA and/or kV according to patient size and/or use of iterative reconstruction technique. COMPARISON:  Prior MRI from 08/13/2021. FINDINGS: Brain: Normal expected interval evolution of recently identified infarct involving the posterior right basal ganglia, relatively stable in size and distribution as compared to prior MRI. No significant regional mass effect or evidence for hemorrhagic transformation. No other new large vessel territory infarct. No other acute intracranial hemorrhage. Underlying  atrophy with chronic microvascular ischemic disease with a few small remote cerebellar infarcts noted. No mass lesion or midline shift. No hydrocephalus or extra-axial fluid collection. Vascular: No hyperdense vessel. Calcified atherosclerosis present at skull base. Skull: Scalp soft tissues and calvarium demonstrate no acute finding. Sinuses/Orbits: Globes orbital soft tissues within normal limits. Mild mucosal thickening present about the maxillary sinuses. Paranasal sinuses are otherwise largely clear. No significant mastoid effusion. Other: None. ASPECTS [facility] Stroke Program Early CT Score) - Ganglionic level infarction (caudate, lentiform nuclei, internal capsule, insula, M1-M3 cortex): 7 - Supraganglionic infarction (M4-M6 cortex): 3 Total score (0-10 with 10 being normal): 10 IMPRESSION: 1. Normal expected interval evolution of previously identified subacute infarct involving the right basal ganglia. No evidence for hemorrhagic transformation or significant mass effect. 2. No other  new acute intracranial abnormality. 3. ASPECTS is 10. 4. Underlying atrophy with chronic small vessel ischemic disease with a few small remote cerebellar infarcts. These results were communicated to Dr. Iver NestleBhagat at 11:53 pm on 08/19/2021 by text page via the [facility] messaging system. Electronically Signed   By: Rise MuBenjamin  McClintock M.D.   On: 08/19/2021 23:56    Procedures Procedures    Medications Ordered in ED Medications  sodium chloride flush (NS) 0.9 % injection 3 mL (3 mLs Intravenous Not Given 08/20/21 0020)  sodium chloride 0.9 % bolus 500 mL (0 mLs Intravenous Stopped 08/20/21 0153)    Followed by  0.9 %  sodium chloride infusion ( Intravenous New Bag/Given 08/20/21 0047)  LORazepam (ATIVAN) injection 1 mg (2 mg Intravenous Incomplete 08/20/21 0203)  oxyCODONE-acetaminophen (PERCOCET/ROXICET) 5-325 MG per tablet 1 tablet (1 tablet Oral Given 08/20/21 0054)    ED Course/ Medical Decision Making/ A&P                            Medical Decision Making Amount and/or Complexity of Data Reviewed Labs: ordered. Radiology: ordered and independent interpretation performed. ECG/medicine tests: ordered and independent interpretation performed.  Risk Prescription drug management. Decision regarding hospitalization.   66 year old male presenting to the ED as code stroke.  Recent right CVA with residual left sided deficits, just discharged from this facility yesterday after admission for this.  Around lunchtime today staff at facility reported that his left arm seemed "weaker" and he was drooling.  Patient is awake, alert, talkative with staff on arrival.  His left upper eyelid does droop but occasionally opens eyes.  He denies any visual deficit.  He is weak on the left compared with right.  Head CT was obtained without acute findings.  Discussed with neurology, Dr. Iver NestleBhagat-- will proceed with MRI.  Labs today with evidence of AKI.  Creatinine at time of discharge was 1.25, today is 2.98.  Still has condom cath on from hospitalization, states he has not been urinating as much is normal.  Bladder scan was obtained with <250cc so no significant urinary retention to suggest postrenal cause.  He was given small fluid bolus and gentle hydration.  Awaiting MRI.  MRI obtained--evolution of prior stroke but no significant change from prior.  Neurology without further work-up recommended at this time.  Patient remains at baseline, no changes from time of arrival.  Will admit for AKI  Discussed with hospitalist, Dr. Leafy HalfShalhoub-- will admit for ongoing care.  Final Clinical Impression(s) / ED Diagnoses Final diagnoses:  AKI (acute kidney injury) (HCC)  Left-sided weakness    Rx / DC Orders ED Discharge Orders     None         Garlon HatchetSanders, Vinayak Bobier M, PA-C 08/20/21 0427    Geoffery Lyonselo, Douglas, MD 08/20/21 [phone]

## 2021-08-19 NOTE — ED Triage Notes (Incomplete)
Pt via [facility] from SNF as Code Stroke- LSN 1200 "at lunch," pt ; deficits of L sided facial droop, slurred speech.

## 2021-08-20 ENCOUNTER — Emergency Department (HOSPITAL_COMMUNITY): Payer: Medicare Other

## 2021-08-20 ENCOUNTER — Encounter (HOSPITAL_COMMUNITY): Payer: Self-pay | Admitting: Internal Medicine

## 2021-08-20 ENCOUNTER — Other Ambulatory Visit: Payer: Self-pay

## 2021-08-20 DIAGNOSIS — I6381 Other cerebral infarction due to occlusion or stenosis of small artery: Secondary | ICD-10-CM

## 2021-08-20 DIAGNOSIS — N179 Acute kidney failure, unspecified: Secondary | ICD-10-CM | POA: Diagnosis not present

## 2021-08-20 DIAGNOSIS — I10 Essential (primary) hypertension: Secondary | ICD-10-CM

## 2021-08-20 DIAGNOSIS — Z794 Long term (current) use of insulin: Secondary | ICD-10-CM

## 2021-08-20 DIAGNOSIS — I639 Cerebral infarction, unspecified: Secondary | ICD-10-CM | POA: Diagnosis not present

## 2021-08-20 DIAGNOSIS — E039 Hypothyroidism, unspecified: Secondary | ICD-10-CM

## 2021-08-20 DIAGNOSIS — I2510 Atherosclerotic heart disease of native coronary artery without angina pectoris: Secondary | ICD-10-CM | POA: Diagnosis not present

## 2021-08-20 DIAGNOSIS — R531 Weakness: Secondary | ICD-10-CM | POA: Diagnosis not present

## 2021-08-20 DIAGNOSIS — K219 Gastro-esophageal reflux disease without esophagitis: Secondary | ICD-10-CM

## 2021-08-20 DIAGNOSIS — F1410 Cocaine abuse, uncomplicated: Secondary | ICD-10-CM | POA: Diagnosis not present

## 2021-08-20 DIAGNOSIS — E1142 Type 2 diabetes mellitus with diabetic polyneuropathy: Secondary | ICD-10-CM

## 2021-08-20 HISTORY — DX: Acute kidney failure, unspecified: N17.9

## 2021-08-20 LAB — CBC
HCT: 33.8 % — ABNORMAL LOW (ref 39.0–52.0)
Hemoglobin: 10.9 g/dL — ABNORMAL LOW (ref 13.0–17.0)
MCH: 29.6 pg (ref 26.0–34.0)
MCHC: 32.2 g/dL (ref 30.0–36.0)
MCV: 91.8 fL (ref 80.0–100.0)
Platelets: 214 10*3/uL (ref 150–400)
RBC: 3.68 MIL/uL — ABNORMAL LOW (ref 4.22–5.81)
RDW: 12.5 % (ref 11.5–15.5)
WBC: 6.5 10*3/uL (ref 4.0–10.5)
nRBC: 0 % (ref 0.0–0.2)

## 2021-08-20 LAB — DIFFERENTIAL
Abs Immature Granulocytes: 0.01 10*3/uL (ref 0.00–0.07)
Basophils Absolute: 0 10*3/uL (ref 0.0–0.1)
Basophils Relative: 1 %
Eosinophils Absolute: 0.6 10*3/uL — ABNORMAL HIGH (ref 0.0–0.5)
Eosinophils Relative: 10 %
Immature Granulocytes: 0 %
Lymphocytes Relative: 27 %
Lymphs Abs: 1.8 10*3/uL (ref 0.7–4.0)
Monocytes Absolute: 0.6 10*3/uL (ref 0.1–1.0)
Monocytes Relative: 9 %
Neutro Abs: 3.5 10*3/uL (ref 1.7–7.7)
Neutrophils Relative %: 53 %

## 2021-08-20 LAB — CBG MONITORING, ED
Glucose-Capillary: 110 mg/dL — ABNORMAL HIGH (ref 70–99)
Glucose-Capillary: 128 mg/dL — ABNORMAL HIGH (ref 70–99)

## 2021-08-20 LAB — URINALYSIS, ROUTINE W REFLEX MICROSCOPIC
Bilirubin Urine: NEGATIVE
Glucose, UA: NEGATIVE mg/dL
Hgb urine dipstick: NEGATIVE
Ketones, ur: NEGATIVE mg/dL
Nitrite: NEGATIVE
Protein, ur: 100 mg/dL — AB
Specific Gravity, Urine: 1.023 (ref 1.005–1.030)
pH: 8 (ref 5.0–8.0)

## 2021-08-20 LAB — COMPREHENSIVE METABOLIC PANEL
ALT: 18 U/L (ref 0–44)
AST: 19 U/L (ref 15–41)
Albumin: 3.5 g/dL (ref 3.5–5.0)
Alkaline Phosphatase: 67 U/L (ref 38–126)
Anion gap: 10 (ref 5–15)
BUN: 50 mg/dL — ABNORMAL HIGH (ref 8–23)
CO2: 20 mmol/L — ABNORMAL LOW (ref 22–32)
Calcium: 9.1 mg/dL (ref 8.9–10.3)
Chloride: 109 mmol/L (ref 98–111)
Creatinine, Ser: 2.98 mg/dL — ABNORMAL HIGH (ref 0.61–1.24)
GFR, Estimated: 22 mL/min — ABNORMAL LOW (ref >60)
Glucose, Bld: 108 mg/dL — ABNORMAL HIGH (ref 70–99)
Potassium: 4.4 mmol/L (ref 3.5–5.1)
Sodium: 139 mmol/L (ref 135–145)
Total Bilirubin: 0.5 mg/dL (ref 0.3–1.2)
Total Protein: 6.7 g/dL (ref 6.5–8.1)

## 2021-08-20 LAB — GLUCOSE, CAPILLARY
Glucose-Capillary: 199 mg/dL — ABNORMAL HIGH (ref 70–99)
Glucose-Capillary: 110 mg/dL — ABNORMAL HIGH (ref 70–99)

## 2021-08-20 LAB — APTT: aPTT: 30 seconds (ref 24–36)

## 2021-08-20 LAB — PROTIME-INR
INR: 1 (ref 0.8–1.2)
Prothrombin Time: 12.5 seconds (ref 11.4–15.2)

## 2021-08-20 IMAGING — MR MR HEAD W/O CM
12 of 13 series · 44 of 48 positions shown · non-contrast
Comparison: CT from [DATE] and brain MRI from [DATE].

CLINICAL DATA: Initial evaluation for neuro deficit, stroke
suspected.

EXAM:
MRI HEAD WITHOUT CONTRAST
TECHNIQUE: Multiplanar, multiecho pulse sequences of the brain and surrounding
structures were obtained without intravenous contrast.

[Series 5: T1 · sagittal · 5.0mm · 0.75mm/px · 1 of 25 slices shown]
[im 1/25]
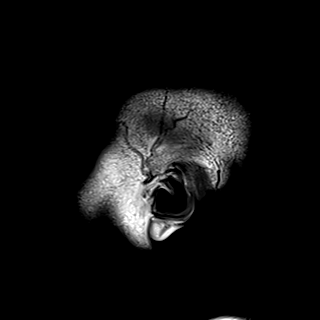

[Series 6: DWI · axial · 3.0mm · 0.92mm/px · z∈[-79,+79]mm · 8 of 108 slices shown (1 of 4)]
[im 1/108]
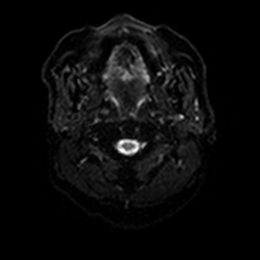
[im 16/108]
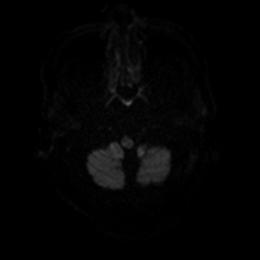
[im 31/108]
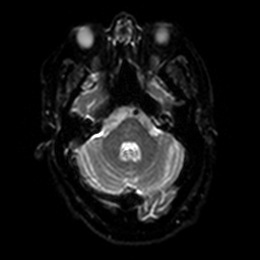
[im 46/108]
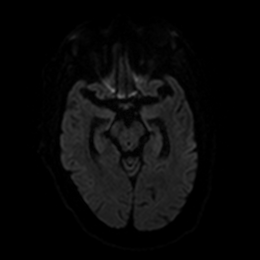
[im 62/108]
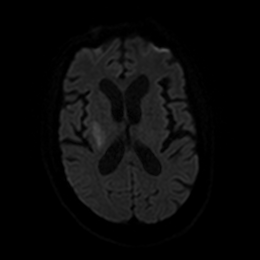
[im 77/108]
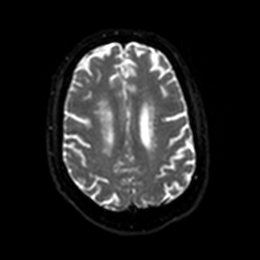
[im 92/108]
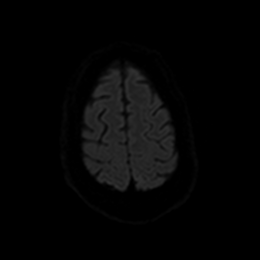
[im 108/108]
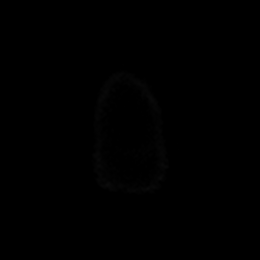

[Series 7: DWI · axial · 3.0mm · 0.92mm/px · z∈[-79,+79]mm · 4 of 53 slices shown (2 of 4)]
[im 1/53]
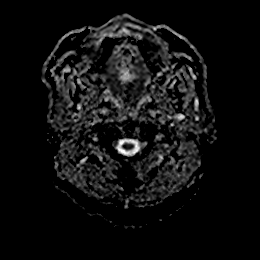
[im 18/53]
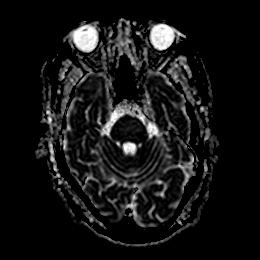
[im 35/53]
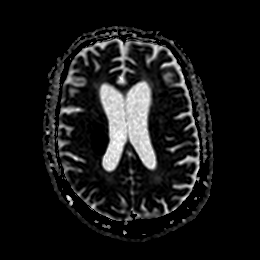
[im 53/53]
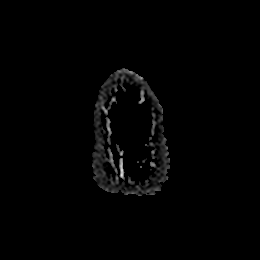

[Series 8: DWI · coronal · 4.0mm · 0.88mm/px · 6 of 80 slices shown (3 of 4)]
[im 1/80]
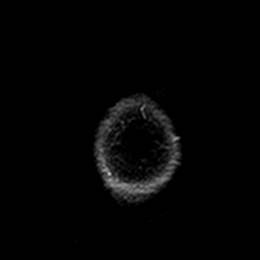
[im 16/80]
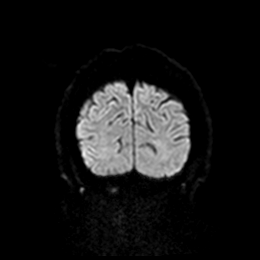
[im 32/80]
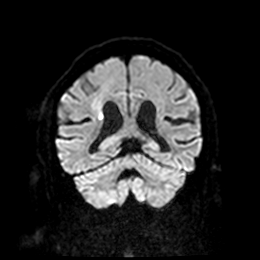
[im 48/80]
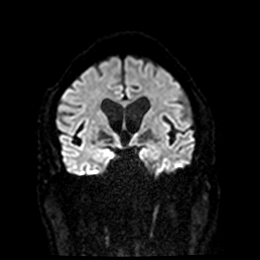
[im 64/80]
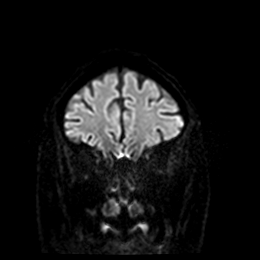
[im 80/80]
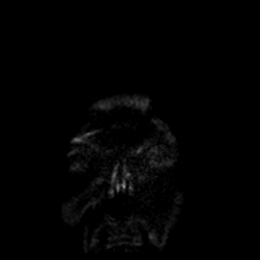

[Series 9: DWI · coronal · 4.0mm · 0.88mm/px · 3 of 40 slices shown (4 of 4)]
[im 1/40]
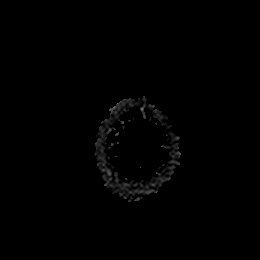
[im 20/40]
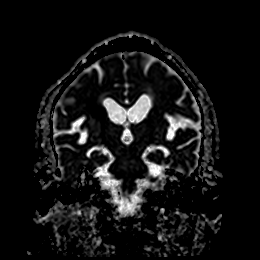
[im 40/40]
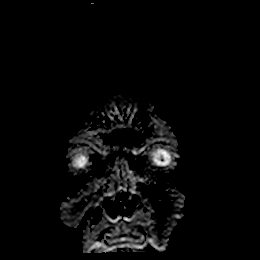

[Series 10: mag_images · axial · 3.0mm · 0.94mm/px · z∈[-81,+84]mm · 4 of 56 slices shown]
[im 1/56]
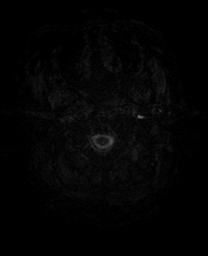
[im 19/56]
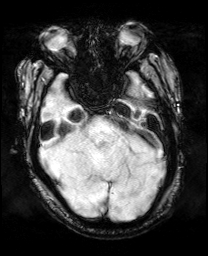
[im 37/56]
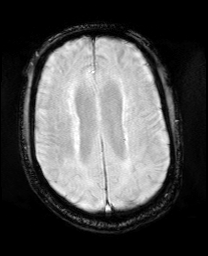
[im 56/56]
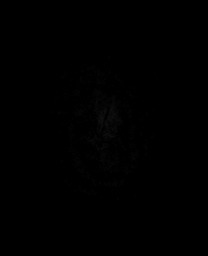

[Series 11: pha_images · axial · 3.0mm · 0.94mm/px · z∈[-81,+84]mm · 4 of 55 slices shown]
[im 1/55]
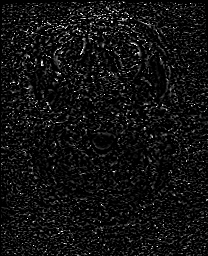
[im 19/55]
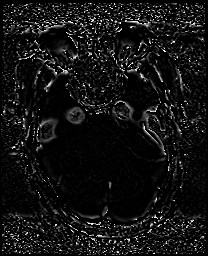
[im 37/55]
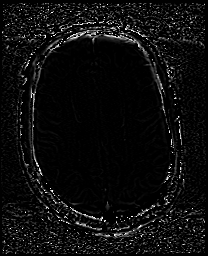
[im 55/55]
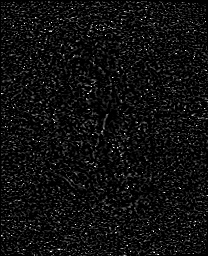

[Series 12: swi_images · axial · 3.0mm · 0.94mm/px · z∈[-81,+84]mm · 4 of 56 slices shown]
[im 1/56]
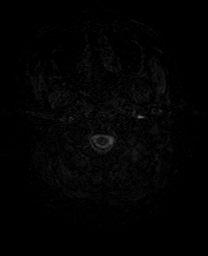
[im 19/56]
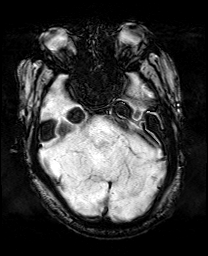
[im 37/56]
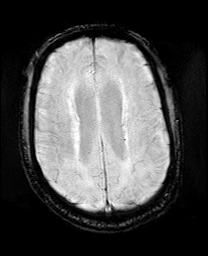
[im 56/56]
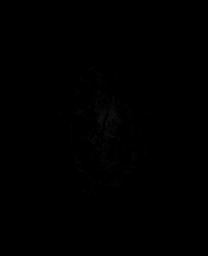

[Series 13: mip_images(sw) · axial · 24.0mm · 0.94mm/px · z∈[-71,+73]mm · 4 of 49 slices shown]
[im 1/49]
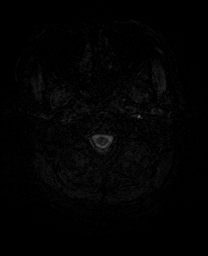
[im 17/49]
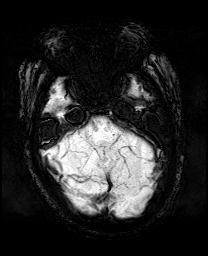
[im 33/49]
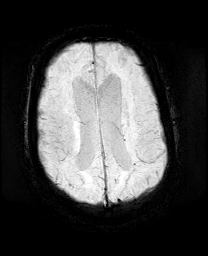
[im 49/49]
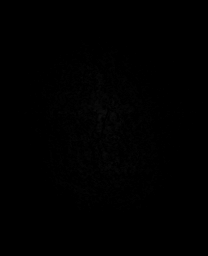

[Series 14: FLAIR · axial · 5.0mm · 0.47mm/px · z∈[-82,+80]mm · 2 of 28 slices shown]
[im 1/28]
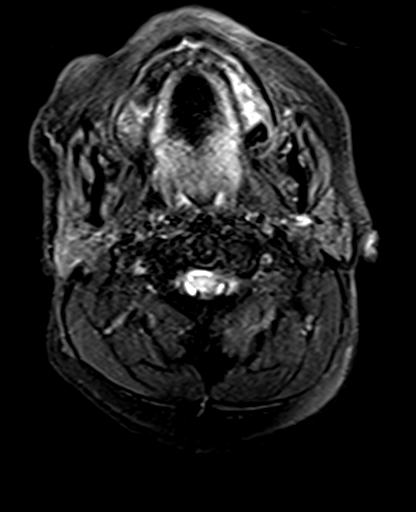
[im 28/28]
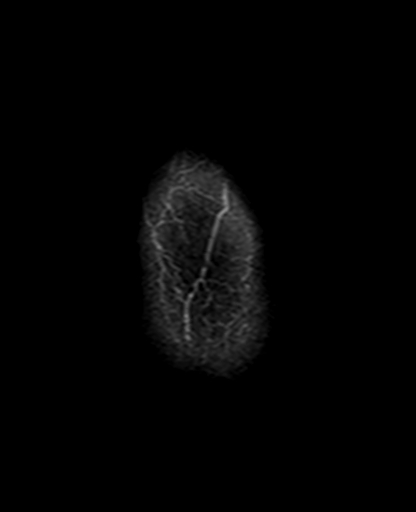

[Series 15: T2 · axial · 5.0mm · 0.75mm/px · z∈[-81,+81]mm · 2 of 28 slices shown (1 of 2)]
[im 1/28]
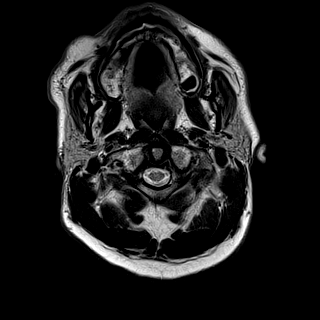
[im 28/28]
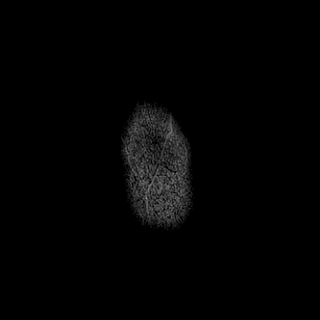

[Series 17: T2 · coronal · 5.0mm · 0.34mm/px · 2 of 34 slices shown (2 of 2)]
[im 1/34]
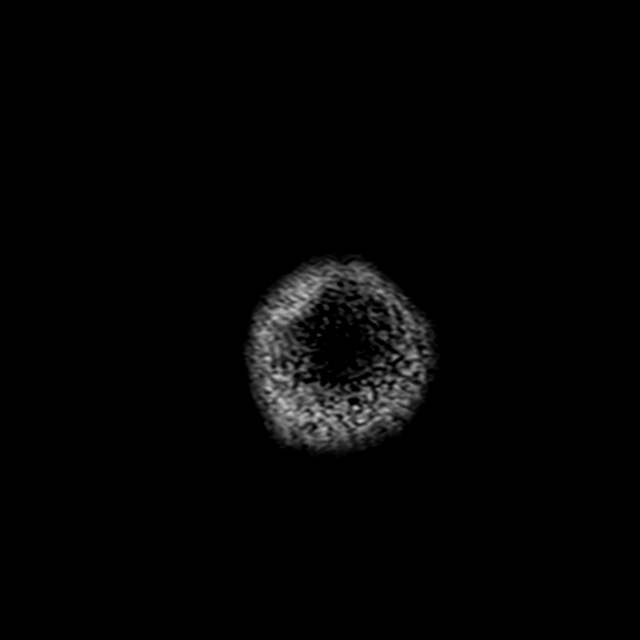
[im 34/34]
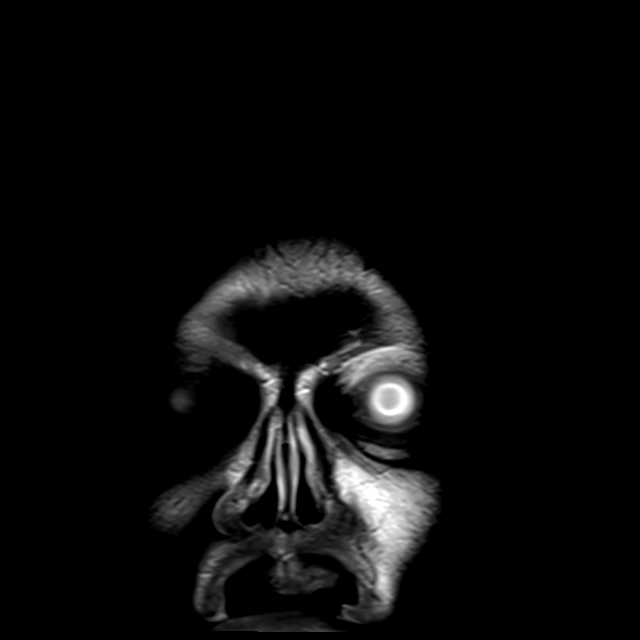

[44 of 48 positions shown; findings below may reference images not displayed]

FINDINGS: Brain: Cerebral volume within normal limits. Chronic microvascular
ischemic disease involving the periventricular deep white matter
both cerebral hemispheres as well as the pons noted, stable. Few
scattered small remote bilateral cerebellar infarcts again noted.
Small remote lacunar infarct present at the left thalamus.

There has been continued interval evolution of recently identified
ischemic infarct involving the posterior right basal ganglia, not
significantly changed in size or morphology as compared to previous.
No associated hemorrhage or significant regional mass effect. No
evidence for significant interval expansion or new infarction.

No other acute or subacute ischemic changes elsewhere within the
brain. Gray-white matter differentiation otherwise maintained. No
acute intracranial hemorrhage. Single punctate chronic
microhemorrhage noted within the right frontal centrum semi ovale,
likely small vessel related.

No mass lesion, midline shift or mass effect. No hydrocephalus or
extra-axial fluid collection. Pituitary gland suprasellar region
within normal limits.

Vascular: Major intracranial vascular flow voids are maintained.

Skull and upper cervical spine: Craniocervical junction normal. Bone
mineral. No scalp soft tissue abnormality.

Sinuses/Orbits: Globes orbital soft tissues within normal limits.
Scattered mucosal thickening noted throughout the paranasal sinuses.
Small right greater than left mastoid effusions. Visualized
nasopharynx within normal limits.

Other: None.
IMPRESSION: 1. Continued interval evolution of recently identified right basal
ganglia ischemic infarct, not significantly changed in size or
morphology as compared to previous. No associated hemorrhage or
significant regional mass effect.
2. No other new acute intracranial abnormality.
3. Underlying chronic microvascular ischemic disease with a few
small remote left thalamic and cerebellar infarcts, stable.

## 2021-08-20 IMAGING — DX DG CHEST 1V PORT
1 series · 1 of 1 positions shown · non-contrast
Comparison: Chest x-ray [DATE]

CLINICAL DATA: Stroke.

EXAM:
PORTABLE CHEST 1 VIEW

[chest]
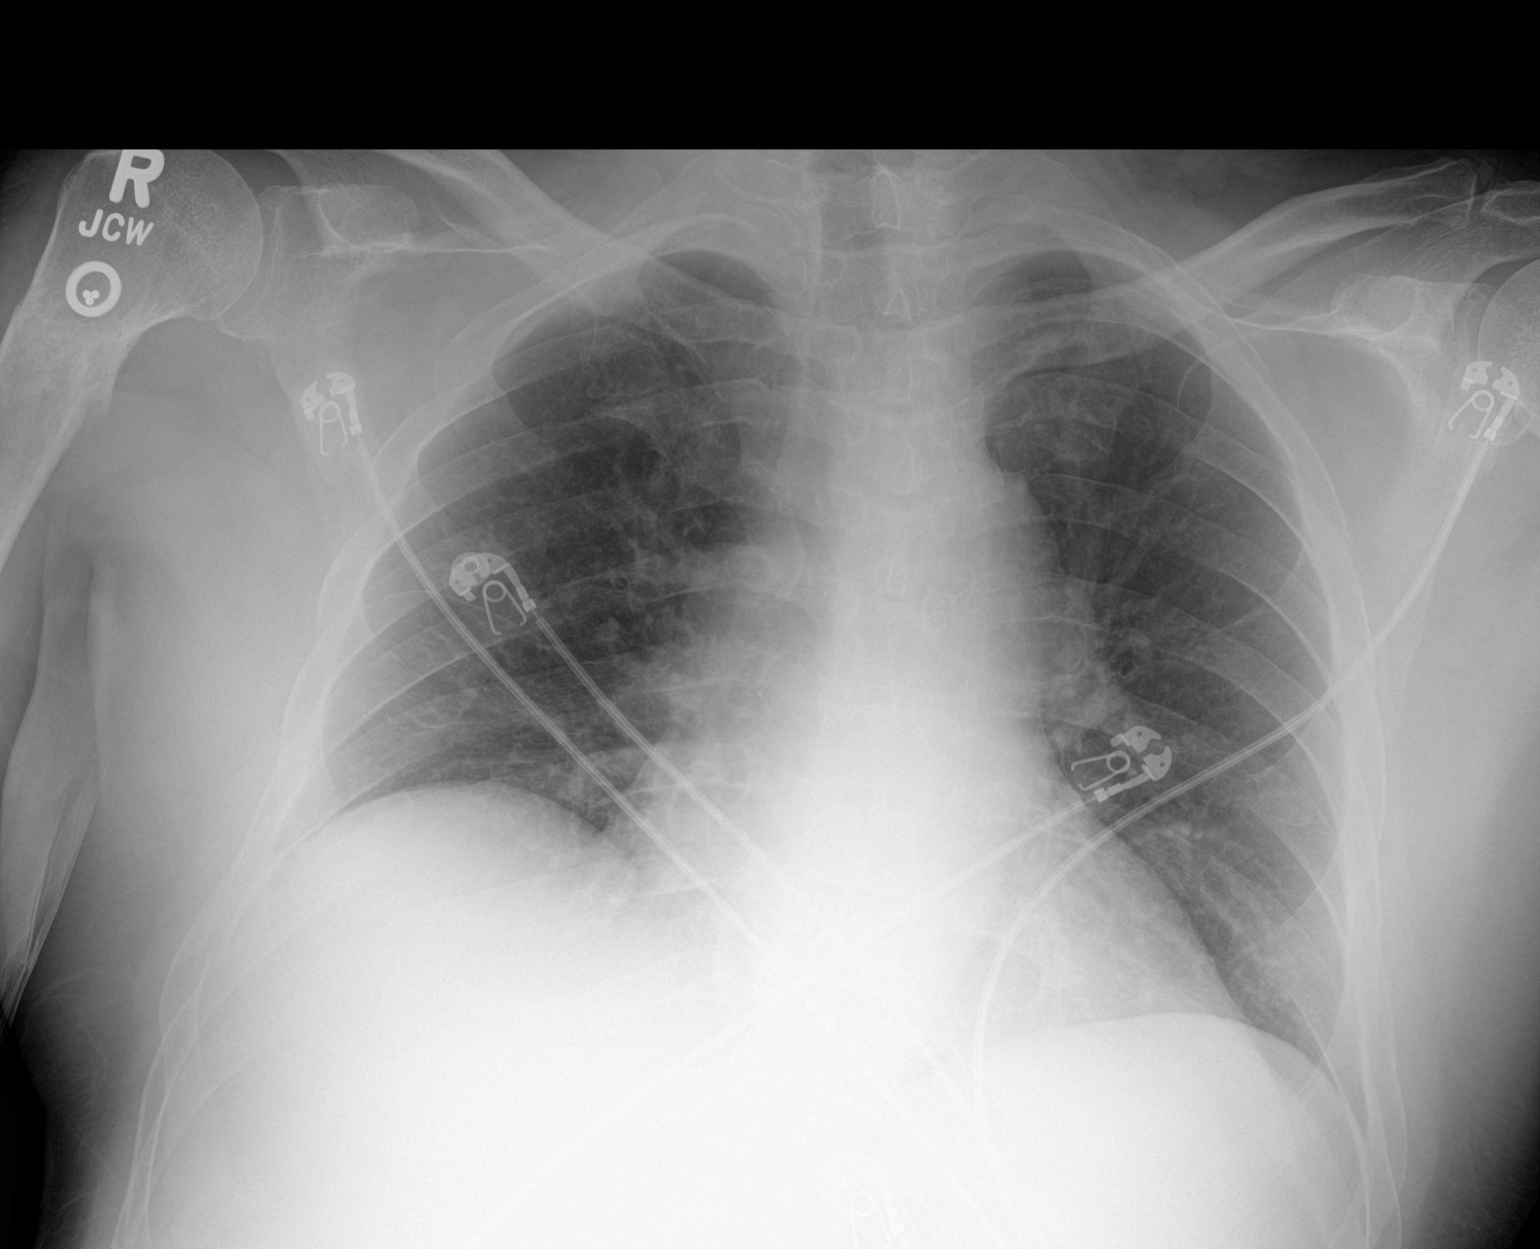

[1 of 1 positions shown; findings below may reference images not displayed]

FINDINGS: The heart size and mediastinal contours are within normal limits.
Both lungs are clear. The visualized skeletal structures are
unremarkable.
IMPRESSION: No active disease.

## 2021-08-20 MED ORDER — ASPIRIN 81 MG PO TBEC
81.0000 mg | DELAYED_RELEASE_TABLET | Freq: Every day | ORAL | Status: DC
  Administered 2021-08-20 – 2021-08-25 (×6): 81 mg via ORAL
  Filled 2021-08-20 (×6): qty 1

## 2021-08-20 MED ORDER — CITALOPRAM HYDROBROMIDE 20 MG PO TABS
20.0000 mg | ORAL_TABLET | Freq: Every morning | ORAL | Status: DC
  Administered 2021-08-20 – 2021-08-25 (×6): 20 mg via ORAL
  Filled 2021-08-20 (×2): qty 1
  Filled 2021-08-20: qty 2
  Filled 2021-08-20 (×4): qty 1

## 2021-08-20 MED ORDER — ACETAMINOPHEN 650 MG RE SUPP
650.0000 mg | Freq: Four times a day (QID) | RECTAL | Status: DC | PRN
  Filled 2021-08-20: qty 1

## 2021-08-20 MED ORDER — INSULIN ASPART 100 UNIT/ML IJ SOLN
3.0000 [IU] | Freq: Three times a day (TID) | INTRAMUSCULAR | Status: DC
Start: 2021-08-20 — End: 2021-08-25
  Administered 2021-08-20 – 2021-08-25 (×15): 3 [IU] via SUBCUTANEOUS

## 2021-08-20 MED ORDER — CARVEDILOL 3.125 MG PO TABS
3.1250 mg | ORAL_TABLET | Freq: Two times a day (BID) | ORAL | Status: DC
  Administered 2021-08-20 – 2021-08-25 (×11): 3.125 mg via ORAL
  Filled 2021-08-20 (×11): qty 1

## 2021-08-20 MED ORDER — OXYCODONE-ACETAMINOPHEN 5-325 MG PO TABS
1.0000 | ORAL_TABLET | Freq: Once | ORAL | Status: AC
  Administered 2021-08-20: 1 via ORAL
  Filled 2021-08-20: qty 1

## 2021-08-20 MED ORDER — GABAPENTIN 400 MG PO CAPS
400.0000 mg | ORAL_CAPSULE | Freq: Three times a day (TID) | ORAL | Status: DC
  Administered 2021-08-20 (×2): 400 mg via ORAL
  Filled 2021-08-20 (×3): qty 1

## 2021-08-20 MED ORDER — LEVOTHYROXINE SODIUM 75 MCG PO TABS
75.0000 ug | ORAL_TABLET | Freq: Every day | ORAL | Status: DC
  Administered 2021-08-20 – 2021-08-24 (×5): 75 ug via ORAL
  Filled 2021-08-20 (×5): qty 1

## 2021-08-20 MED ORDER — CLOPIDOGREL BISULFATE 75 MG PO TABS
75.0000 mg | ORAL_TABLET | Freq: Every day | ORAL | Status: DC
  Administered 2021-08-20 – 2021-08-25 (×6): 75 mg via ORAL
  Filled 2021-08-20 (×6): qty 1

## 2021-08-20 MED ORDER — LORAZEPAM 2 MG/ML IJ SOLN
1.0000 mg | Freq: Once | INTRAMUSCULAR | Status: DC | PRN
  Filled 2021-08-20: qty 1

## 2021-08-20 MED ORDER — POLYETHYLENE GLYCOL 3350 17 G PO PACK: 17.0000 g | PACK | Freq: Every day | ORAL | Status: DC | PRN

## 2021-08-20 MED ORDER — GABAPENTIN 100 MG PO CAPS
200.0000 mg | ORAL_CAPSULE | Freq: Three times a day (TID) | ORAL | Status: DC
  Administered 2021-08-20 – 2021-08-25 (×15): 200 mg via ORAL
  Filled 2021-08-20 (×15): qty 2

## 2021-08-20 MED ORDER — AMLODIPINE BESYLATE 10 MG PO TABS
10.0000 mg | ORAL_TABLET | Freq: Every morning | ORAL | Status: DC
  Administered 2021-08-20 – 2021-08-25 (×6): 10 mg via ORAL
  Filled 2021-08-20: qty 1
  Filled 2021-08-20: qty 2
  Filled 2021-08-20 (×4): qty 1

## 2021-08-20 MED ORDER — ACETAMINOPHEN 325 MG PO TABS
650.0000 mg | ORAL_TABLET | Freq: Four times a day (QID) | ORAL | Status: DC | PRN
  Administered 2021-08-21 – 2021-08-24 (×4): 650 mg via ORAL
  Filled 2021-08-20 (×5): qty 2

## 2021-08-20 MED ORDER — INSULIN GLARGINE-YFGN 100 UNIT/ML ~~LOC~~ SOLN
10.0000 [IU] | Freq: Every day | SUBCUTANEOUS | Status: DC
Start: 2021-08-20 — End: 2021-08-25
  Administered 2021-08-20 – 2021-08-24 (×5): 10 [IU] via SUBCUTANEOUS
  Filled 2021-08-20 (×6): qty 0.1

## 2021-08-20 MED ORDER — HYDRALAZINE HCL 20 MG/ML IJ SOLN: 10.0000 mg | Freq: Four times a day (QID) | INTRAMUSCULAR | Status: DC | PRN

## 2021-08-20 MED ORDER — INSULIN ASPART 100 UNIT/ML IJ SOLN
0.0000 [IU] | Freq: Three times a day (TID) | INTRAMUSCULAR | Status: DC
  Administered 2021-08-20: 3 [IU] via SUBCUTANEOUS
  Administered 2021-08-20 – 2021-08-21 (×4): 2 [IU] via SUBCUTANEOUS
  Administered 2021-08-22: 3 [IU] via SUBCUTANEOUS
  Administered 2021-08-22 (×2): 2 [IU] via SUBCUTANEOUS
  Administered 2021-08-23: 3 [IU] via SUBCUTANEOUS
  Administered 2021-08-23 (×2): 2 [IU] via SUBCUTANEOUS
  Administered 2021-08-24 (×2): 3 [IU] via SUBCUTANEOUS
  Administered 2021-08-25 (×2): 2 [IU] via SUBCUTANEOUS

## 2021-08-20 MED ORDER — ONDANSETRON HCL 4 MG/2ML IJ SOLN
4.0000 mg | Freq: Four times a day (QID) | INTRAMUSCULAR | Status: DC | PRN
  Filled 2021-08-20: qty 2

## 2021-08-20 MED ORDER — SODIUM CHLORIDE 0.9% FLUSH
3.0000 mL | Freq: Two times a day (BID) | INTRAVENOUS | Status: DC
  Administered 2021-08-20 – 2021-08-25 (×8): 3 mL via INTRAVENOUS

## 2021-08-20 MED ORDER — SODIUM CHLORIDE 0.9 % IV SOLN: INTRAVENOUS | Status: DC

## 2021-08-20 MED ORDER — ONDANSETRON HCL 4 MG PO TABS: 4.0000 mg | ORAL_TABLET | Freq: Four times a day (QID) | ORAL | Status: DC | PRN

## 2021-08-20 MED ORDER — ENOXAPARIN SODIUM 30 MG/0.3ML IJ SOSY
30.0000 mg | PREFILLED_SYRINGE | Freq: Every day | INTRAMUSCULAR | Status: DC
  Administered 2021-08-20 – 2021-08-21 (×2): 30 mg via SUBCUTANEOUS
  Filled 2021-08-20 (×2): qty 0.3

## 2021-08-20 MED ORDER — PANTOPRAZOLE SODIUM 40 MG PO TBEC
40.0000 mg | DELAYED_RELEASE_TABLET | Freq: Every morning | ORAL | Status: DC
  Administered 2021-08-20 – 2021-08-25 (×6): 40 mg via ORAL
  Filled 2021-08-20 (×6): qty 1

## 2021-08-20 MED ORDER — SODIUM CHLORIDE 0.9 % IV BOLUS
500.0000 mL | Freq: Once | INTRAVENOUS | Status: AC
  Administered 2021-08-20: 500 mL via INTRAVENOUS

## 2021-08-20 MED ORDER — ATORVASTATIN CALCIUM 40 MG PO TABS
40.0000 mg | ORAL_TABLET | Freq: Every evening | ORAL | Status: DC
  Administered 2021-08-20 – 2021-08-24 (×5): 40 mg via ORAL
  Filled 2021-08-20 (×5): qty 1

## 2021-08-20 NOTE — Assessment & Plan Note (Signed)
.   Resume home regimen of Synthroid 

## 2021-08-20 NOTE — Plan of Care (Signed)
  Problem: Coping: Goal: Ability to adjust to condition or change in health will improve Outcome: Progressing   Problem: Tissue Perfusion: Goal: Adequacy of tissue perfusion will improve Outcome: Progressing   Problem: Education: Goal: Knowledge of General Education information will improve Description: Including pain rating scale, medication(s)/side effects and non-pharmacologic comfort measures Outcome: Progressing

## 2021-08-20 NOTE — TOC Progression Note (Signed)
Transition of Care [facility]) - Initial/Assessment Note    Patient Details  Name: Steve Alvarado MRN: [id number] Date of Birth: 13-Jun-1955  Transition of Care [facility]) CM/SW Contact:    Ralene Bathe, LCSWA Phone Number: 08/20/2021, 1:43 PM  Clinical Narrative:                  Transition of Care Department [facility]) has reviewed patient.  Patient is from [facility]) SNF.  CSW contacted admissions at [facility] to inquire about whether the patient is LTC with the facility and is awaiting a response.     TOC will continue to follow.   Patient Goals and CMS Choice        Expected Discharge Plan and Services                                                Prior Living Arrangements/Services                       Activities of Daily Living      Permission Sought/Granted                  Emotional Assessment              Admission diagnosis:  Left-sided weakness [R53.1] AKI (acute kidney injury) (HCC) [N17.9] Acute kidney injury (HCC) [N17.9] Patient Active Problem List   Diagnosis Date Noted   Acute kidney injury (HCC) 08/20/2021   GERD without esophagitis 08/20/2021   Basal ganglia stroke (HCC) 08/12/2021   Type 2 diabetes mellitus with diabetic polyneuropathy, with long-term current use of insulin (HCC) 07/09/2021   Hypothyroidism 07/09/2021   Depression 10/21/2020   Non compliance w medication regimen 10/21/2020   Cocaine abuse (HCC) 09/05/2020   Adjustment disorder with emotional disturbance 09/05/2020   Essential hypertension 02/14/2011   Coronary artery disease involving native coronary artery of native heart without angina pectoris 08/13/2010   Stented coronary artery 08/13/2010   PCP:  Ellery Plunk, MD Pharmacy:  No Pharmacies Listed    Social Determinants of Health (SDOH) Interventions    Readmission Risk Interventions     View : No data to display.

## 2021-08-20 NOTE — Progress Notes (Signed)
6/1: CSW received notification from [facility] that they have approved patient's appeal and will cover SNF services for [facility].   Gilmore Laroche, MSW, [facility]

## 2021-08-20 NOTE — ED Notes (Signed)
Patient incont. Of loose stool, cleaned patient and repositioned in bed.

## 2021-08-20 NOTE — Assessment & Plan Note (Signed)
•   Patient is currently chest pain free °• Monitoring patient on telemetry °• Continue home regimen of antiplatelet therapy, lipid lowering therapy and AV nodal blocking therapy ° °

## 2021-08-20 NOTE — Assessment & Plan Note (Addendum)
.   Patient exhibiting evidence of acute kidney injury of unknown etiology . Creatinine is currently 2.98 an increase compared to baseline of 1.25 . Bladder scan obtained revealing a postvoid residual of approximately 220 cc in the bladder making it unlikely that postobstructive uropathy as the etiology . Urinalysis not suggestive of urinary tract infection . Urine electrolytes and urine toxicology screen pending . Holding losartan . Hydrating patient with intravenous isotonic fluids. . If work-up is completely negative and renal injury is refractory to intravenous fluids it is possible that patient may be suffering from hypotension and hypoperfusion of the kidneys with his current antihypertensive regimen.  We will monitor closely. . Strict input and output monitoring . Monitoring renal function and electrolytes with serial chemistries . Avoiding nephrotoxic agents if at all possible

## 2021-08-20 NOTE — Assessment & Plan Note (Signed)
Continuing home regimen of daily PPI therapy.  

## 2021-08-20 NOTE — ED Notes (Signed)
Loose stool patient cleaned and repositioned on stretcher.

## 2021-08-20 NOTE — Assessment & Plan Note (Signed)
.   Patient been placed on Accu-Cheks before every meal and nightly with sliding scale insulin . Holding home regimen of metformin . Resuming basal bolus regimen of Semglee and Humalog . Hemoglobin A1C 7.9% on 08/13/2021  . diabetic Diet

## 2021-08-20 NOTE — Plan of Care (Signed)
MRI brain personally reviewed, agree with radiology:  1. Continued interval evolution of recently identified right basal ganglia ischemic infarct, not significantly changed in size or morphology as compared to previous. No associated hemorrhage or significant regional mass effect. 2. No other new acute intracranial abnormality. 3. Underlying chronic microvascular ischemic disease with a few small remote left thalamic and cerebellar infarcts, stable.   Plan as documented previously, no further inpatient neurological workup at this time, neurology will be available as needed going forward

## 2021-08-20 NOTE — ED Notes (Addendum)
Patient IVF paused, transported to MRI

## 2021-08-20 NOTE — Assessment & Plan Note (Signed)
.   Resume patients home regimen of oral antihypertensives . Patient appears to be normotensive throughout the emergency department stay . PRN intravenous antihypertensives for excessively elevated blood pressure

## 2021-08-20 NOTE — Assessment & Plan Note (Signed)
   Patient's transient neurologic symptoms may be secondary to acute kidney injury and mild uremia per neurology  MRI reveals no significant change compared to prior  Continue supportive care  Continue PT involvement throughout this hospitalization  Continue regimen of aspirin, Plavix and Lipitor

## 2021-08-20 NOTE — Consult Note (Signed)
Neurology Consultation Reason for Consult: Worsening left-sided weakness Requesting Physician: Geoffery Lyons  CC: Worsening left-sided weakness  History is obtained from: Patient and chart review  HPI: Steve Alvarado is a 66 y.o. right-handed versus ambidextrous man with a past medical history significant for hypertension, hyperlipidemia, diabetes, remote cocaine use, and recent right corona radiata/lentiform nucleus stroke, discharged 5/31.  He reports that today he began to notice worsening left-sided weakness starting at noon, which has gradually progressed.  He additionally notes that his urine output has been low recently, but denies any other complaints  LKW: Noon tPA given?: No, recent stroke  IA performed?: No, exam not consistent with LVO Premorbid modified rankin scale:      5 - Severe disability. Requires constant nursing care and attention, bedridden, incontinent.  ROS: All other review of systems was negative except as noted in the HPI.  Past Medical History:  Diagnosis Date   Coronary artery disease    Diabetes mellitus    Type II   Hyperlipidemia    Hypertension    Thyroid disease    Past Surgical History:  Procedure Laterality Date   CARDIAC CATHETERIZATION  08/06/2010   Bare metal stent placed in RCA.   HAND SURGERY     left   Current Outpatient Medications  Medication Instructions   amLODipine (NORVASC) 10 mg, Oral, Every morning   ASPERCREME LIDOCAINE 4 % 1 patch, Transdermal, See admin instructions, Apply one patch topically to left abdomen every morning - leave on for 12 hours   aspirin EC 81 mg, Oral, Daily, Swallow whole.   atorvastatin (LIPITOR) 40 mg, Oral, Every evening   carvedilol (COREG) 3.125 mg, Oral, 2 times daily with meals   citalopram (CELEXA) 20 mg, Oral, Daily   clopidogrel (PLAVIX) 75 mg, Oral, Daily   gabapentin (NEURONTIN) 400 mg, Oral, 3 times daily, 8am, 12pm 4pm   HumaLOG 3 Units, Subcutaneous, 3 times daily with meals    insulin glargine-yfgn (SEMGLEE) 10 Units, Subcutaneous, Daily at bedtime   levothyroxine (SYNTHROID) 75 mcg, Oral, See admin instructions, Take one tablet (75 mcg) by mouth daily at 10pm   losartan (COZAAR) 50 mg, Oral, Every morning   metFORMIN (GLUCOPHAGE) 1,000 mg, Oral, 2 times daily   pantoprazole (PROTONIX) 40 mg, Oral, Every morning     Family History  Problem Relation Age of Onset   Heart attack Mother    Social History:  reports that he has never smoked. He has never used smokeless tobacco. He reports current drug use. He reports that he does not drink alcohol.   Exam: Current vital signs: BP 137/69   Pulse 70   Temp 98 F (36.7 C) (Oral)   Resp 12   SpO2 97%  Vital signs in last 24 hours:     Physical Exam  Constitutional: Appears well-developed and well-nourished.  Psych: Affect appropriate to situation, pleasant and cooperative Eyes: No scleral injection HENT: No oropharyngeal obstruction.  MSK: no joint deformities.  Cardiovascular: Normal rate and regular rhythm.  Perfusing extremities well Respiratory: Effort normal, non-labored breathing GI: Soft. There is no tenderness.  Skin: Warm dry and intact visible skin  Neuro: Mental Status: Patient is awake, alert, oriented to person, place, month, year, and situation. Patient is able to give a clear and coherent history. No signs of aphasia or neglect Cranial Nerves: II: Visual Fields are full. Pupils are equal, round, and reactive to light.   III,IV, VI: EOMI without ptosis or diploplia.  V: Facial sensation is  reduced on the left face VII: Facial movement is notable for left facial droop.  VIII: hearing is intact to voice X: Uvula elevates symmetrically XI: Shoulder shrug is symmetric. XII: tongue is midline without atrophy or fasciculations.  Motor: Tone is normal. Bulk is normal. 5/5 strength was present on the right.  Automatic flexion of the arm with your morning on the left upper extremity, trace  left lower extremity muscle activation, otherwise no left sided movement (noxious stimulation not applied) Sensory: Reduced sensation on the left arm and leg Cerebellar: FNF and HKS are intact on the right, unable to perform on the left secondary to weakness Gait:  Nonambulatory since last stroke  NIHSS total 9 Score breakdown:  Performed at time of patient arrival to ED    I have reviewed labs in epic and the results pertinent to this consultation are: Creatinine increased from 1.25 on discharge to 3.4 this admission BUN increased from 25 at discharge to 48 this admission Glucose 105  I have reviewed the images obtained: Head CT personally reviewed, agree with radiology no acute intracranial process with expected evolution of recent stroke   Impression: Most likely recrudescence of stroke symptoms in the setting of AKI/uremia.  Recommendations: -MRI brain to rule out any new stroke -If patient is admitted, telemetry for arrhythmia monitoring if patient does not already have event monitor in place -Management of AKI and toxic metabolic processes per ED/primary team -Neurology will follow up MRI brain, but otherwise will be available on an as-needed basis going forward.  Please reach out if any questions or concerns arise.  Brooke Dare MD-PhD [facility] [phone] Available 7 PM to 7 AM, outside of these hours please call Neurologist on call as listed on Amion.

## 2021-08-20 NOTE — ED Notes (Signed)
Pt's daughter Marcelle Smiling requesting all necessary updates. Number updated in chart

## 2021-08-20 NOTE — Assessment & Plan Note (Signed)
   Patient reports remaining abstinent  Continuing to counsel cessation

## 2021-08-20 NOTE — H&P (Signed)
History and Physical    Patient: Steve Alvarado MRN: [id number] DOA: 08/19/2021  Date of Service: the patient was seen and examined on 08/20/2021  Patient coming from: SNF via EMS  Chief Complaint:  Chief Complaint  Patient presents with   Code Stroke    HPI:   66 year old male with past medical history of coronary artery disease (PCI to mid RCA 2012), hypertension, hypothyroidism, insulin-dependent diabetes mellitus type 2 (hgb a1c 7.9% 08/13/2021), hyperlipidemia, history of cocaine abuse and recent hospitalization in late May for acute stroke with left-sided weakness status post TNK administration who presents now to [facility] emergency department via EMS due to concerns for recurrent left facial droop, left-sided weakness and slurred speech.  Patient explains that at approximately 12:00PM on 6/1 he began to experience worsening left-sided weakness and left facial droop beyond his baseline.  Patient reports associated drooling.  Patient denies any visual changes unsteady gait or fall.   Patient symptoms continued to persist throughout the afternoon and evening and shortly after 11 PM EMS was contacted.  Patient was then brought into [facility] emergency department initially as a code stroke.  Upon arrival to the emergency department patient was promptly evaluated as a code stroke with evaluation by neurology.  Neurology had recommended repeat MRI of the brain which revealed continued internal evolution of the recently identified right basal ganglia ischemic infarct without significant change in size associated hemorrhage or mass effect.  Code stroke was canceled.  During the patient's thorough evaluation patient was identified to be suffering from acute kidney injury with creatinine of 2.98.  Neurology felt that patient's worsening neurologic symptoms could be related to this and therefore hospitalist group was called to assess the patient for admission the hospital for  work-up and management of the patient's acute kidney injury.  Review of Systems: Review of Systems  Neurological:  Positive for speech change and focal weakness.  All other systems reviewed and are negative.   Past Medical History:  Diagnosis Date   Coronary artery disease    Diabetes mellitus    Type II   Hyperlipidemia    Hypertension    Thyroid disease     Past Surgical History:  Procedure Laterality Date   CARDIAC CATHETERIZATION  08/06/2010   Bare metal stent placed in RCA.   HAND SURGERY     left    Social History:  reports that he has never smoked. He has never used smokeless tobacco. He reports current drug use. He reports that he does not drink alcohol.  Allergies  Allergen Reactions   Ace Inhibitors Cough    Family History  Problem Relation Age of Onset   Heart attack Mother     Prior to Admission medications   Medication Sig Start Date End Date Taking? Authorizing Provider  amLODipine (NORVASC) 10 MG tablet Take 1 tablet (10 mg total) by mouth every morning. 08/19/21  Yes de Saintclair Halsted, Cortney E, NP  aspirin EC 81 MG tablet Take 1 tablet (81 mg total) by mouth daily. Swallow whole. 08/18/21  Yes Shafer, Ludger Nutting, NP  atorvastatin (LIPITOR) 40 MG tablet Take 1 tablet (40 mg total) by mouth every evening. 10/23/20 08/20/21 Yes Delfino Lovett, MD  citalopram (CELEXA) 20 MG tablet Take 1 tablet (20 mg total) by mouth daily. Patient taking differently: Take 20 mg by mouth every morning. 10/23/20 08/20/21 Yes Delfino Lovett, MD  losartan (COZAAR) 50 MG tablet Take 50 mg by mouth every morning. 05/19/21  Yes  [provider]  pantoprazole (PROTONIX) 40 MG tablet Take 40 mg by mouth every morning. 05/19/21  Yes [provider]  ASPERCREME LIDOCAINE 4 % Place 1 patch onto the skin See admin instructions. Apply one patch topically to left abdomen every morning - leave on for 12 hours 07/12/21   [provider]  carvedilol (COREG) 3.125 MG tablet Take 1 tablet (3.125 mg  total) by mouth 2 (two) times daily with a meal. Patient taking differently: Take 3.125 mg by mouth every 12 (twelve) hours. 10/23/20 08/12/21  Delfino LovettShah, Vipul, MD  clopidogrel (PLAVIX) 75 MG tablet Take 1 tablet (75 mg total) by mouth daily. 08/18/21   Elmer PickerShafer, Devon, NP  gabapentin (NEURONTIN) 400 MG capsule Take 400 mg by mouth 3 (three) times daily. 8am, 12pm 4pm 06/03/21   [provider]  insulin glargine-yfgn (SEMGLEE) 100 UNIT/ML injection Inject 0.1 mLs (10 Units total) into the skin at bedtime. 08/17/21   Elmer PickerShafer, Devon, NP  insulin lispro (HUMALOG) 100 UNIT/ML injection Inject 0.03 mLs (3 Units total) into the skin 3 (three) times daily with meals. 10/23/20   Delfino LovettShah, Vipul, MD  levothyroxine (SYNTHROID) 75 MCG tablet Take 75 mcg by mouth See admin instructions. Take one tablet (75 mcg) by mouth daily at 10pm 05/19/21   [provider]  metFORMIN (GLUCOPHAGE) 1000 MG tablet Take 1,000 mg by mouth 2 (two) times daily. 05/19/21   [provider]    Physical Exam:  Vitals:   08/20/21 0255 08/20/21 0330 08/20/21 0400 08/20/21 0430  BP: 133/69 (!) 98/56 (!) 104/55 (!) 108/58  Pulse: 71 65 65 65  Resp: 12 10 12 16   Temp:      TempSrc:      SpO2: 96% 98% 95% 96%    Constitutional: Lethargic but arousable, oriented x3, no associated distress.   Skin: no rashes, no lesions, slightly skin turgor noted. Eyes: Pupils are equally reactive to light.  No evidence of scleral icterus or conjunctival pallor.  ENMT: Slightly dry mucous membranes noted.  Posterior pharynx clear of any exudate or lesions.   Neck: normal, supple, no masses, no thyromegaly.  No evidence of jugular venous distension.   Respiratory: scattered rhonchi bilaterally with no evidence of wheezing or rales.  Normal respiratory effort. No accessory muscle use.  Cardiovascular: Regular rate and rhythm, no murmurs / rubs / gallops. No extremity edema. 2+ pedal pulses. No carotid bruits.  Chest:   Nontender without  crepitus or deformity.   Back:   Nontender without crepitus or deformity. Abdomen: Abdomen is soft and nontender.  No evidence of intra-abdominal masses.  Positive bowel sounds noted in all quadrants.   Musculoskeletal: Notable developing mild contractures of the left upper extremity.  No joint deformity upper and lower extremities. Good ROM, Normal muscle tone.  Neurologic: 2 out of 5 strength noted of the left lower extremity in the proximal and distal muscle groups.  1 out of 5 strength in the left upper extremity in the proximal and distal muscle groups.  Sensation is grossly intact.  Notable left facial droop on exam. Patient is following all commands.  Patient is responsive to verbal stimuli.   Psychiatric: Patient exhibits normal mood with appropriate affect.  Patient seems to possess insight as to their current situation.    Data Reviewed:  I have personally reviewed and interpreted labs, imaging.  Significant findings are:  Chemistry revealing sodium 139, glucose 108, BUN 50, creatinine 2.98. CBC revealing white blood cell count of 6.5, hemoglobin  10.9, hematocrit 33.8, platelet count of 214. Urinalysis revealing cloudy appearance with 100 protein, small leukocyte esterase and 0-5 white blood cells per high-powered field with rare bacteria. Chest x-ray personally reviewed revealing no evidence of cardiopulmonary disease. MRI brain without contrast revealing continued interval evolution of recently identified right basal ganglia ischemic infarct not significantly changed in size or morphology as compared to previous.  No associated hemorrhage or significant regional mass effect  EKG: Personally reviewed.  Rhythm is sinus rhythm with heart rate of 70 bpm.  No dynamic ST segment changes appreciated.   Assessment and Plan: * Acute kidney injury [facility]) Patient exhibiting evidence of acute kidney injury of unknown etiology Creatinine is currently 2.98 an increase compared to baseline of  1.25 Bladder scan obtained revealing a postvoid residual of approximately 220 cc in the bladder making it unlikely that postobstructive uropathy as the etiology Urinalysis not suggestive of urinary tract infection Urine electrolytes and urine toxicology screen pending Holding losartan Hydrating patient with intravenous isotonic fluids. Strict input and output monitoring Monitoring renal function and electrolytes with serial chemistries Avoiding nephrotoxic agents if at all possible   Basal ganglia stroke (HCC) Patient's transient neurologic symptoms may be secondary to acute kidney injury and mild uremia per neurology MRI reveals no significant change compared to prior Continue supportive care Continue PT involvement throughout this hospitalization Continue regimen of aspirin, Plavix and Lipitor  Type 2 diabetes mellitus with diabetic polyneuropathy, with long-term current use of insulin (HCC) Patient been placed on Accu-Cheks before every meal and nightly with sliding scale insulin Holding home regimen of metformin Resuming basal bolus regimen of Semglee and Humalog Hemoglobin A1C 7.9% on 08/13/2021  diabetic Diet   Coronary artery disease involving native coronary artery of native heart without angina pectoris Patient is currently chest pain free Monitoring patient on telemetry Continue home regimen of antiplatelet therapy, lipid lowering therapy and AV nodal blocking therapy   Essential hypertension Resume patients home regimen of oral antihypertensives Patient appears to be normotensive throughout the emergency department stay PRN intravenous antihypertensives for excessively elevated blood pressure    Cocaine abuse (HCC) Patient reports remaining abstinent Continuing to counsel cessation  Hypothyroidism Resume home regimen of Synthroid      Code Status:  Full code  code status decision has been confirmed with: patient Family Communication: deferred   Consults:  Dr. Iver Nestle with Neurology  Severity of Illness:  The appropriate patient status for this patient is OBSERVATION. Observation status is judged to be reasonable and necessary in order to provide the required intensity of service to ensure the patient's safety. The patient's presenting symptoms, physical exam findings, and initial radiographic and laboratory data in the context of their medical condition is felt to place them at decreased risk for further clinical deterioration. Furthermore, it is anticipated that the patient will be medically stable for discharge from the hospital within 2 midnights of admission.   Author:  Marinda Elk MD  08/20/2021 5:16 AM

## 2021-08-20 NOTE — Evaluation (Signed)
Physical Therapy Evaluation Patient Details Name: Steve Alvarado MRN: [id number] DOB: Oct 31, 1955 Today's Date: 08/20/2021  History of Present Illness  Pt is a 66 y/o male presenting for worsening weakness in L extremeties and AKI. Imaging showed Continued interval evolution of recently identified right basal  ganglia ischemic infarct. PMH includes recent CVA, CAD, HTN, DM.  Clinical Impression  Pt admitted secondary to problem above with deficits below. Pt requiring min to mod A for rolling this session for clean up following BM. Per notes, pt was discharged last admission with therapy follow up at SNF given his new stroke. Recommending return to SNF at d/c. Will continue to follow acutely.        Recommendations for follow up therapy are one component of a multi-disciplinary discharge planning process, led by the attending physician.  Recommendations may be updated based on patient status, additional functional criteria and insurance authorization.  Follow Up Recommendations Skilled nursing-short term rehab (<3 hours/day)    Assistance Recommended at Discharge Intermittent Supervision/Assistance  Patient can return home with the following  Two people to help with walking and/or transfers;Two people to help with bathing/dressing/bathroom;Assistance with cooking/housework;Assistance with feeding;Direct supervision/assist for medications management;Direct supervision/assist for financial management;Assist for transportation;Help with stairs or ramp for entrance    Equipment Recommendations Wheelchair (measurements PT);Wheelchair cushion (measurements PT);Other (comment) (hoyer lift)  Recommendations for Other Services       Functional Status Assessment Patient has had a recent decline in their functional status and demonstrates the ability to make significant improvements in function in a reasonable and predictable amount of time.     Precautions / Restrictions Precautions Precautions:  Fall Restrictions Weight Bearing Restrictions: No      Mobility  Bed Mobility Overal bed mobility: Needs Assistance Bed Mobility: Rolling Rolling: Min assist, Mod assist         General bed mobility comments: Pt had BM so session focused on rolling for clean up. Required min A to roll to the L and mod A to roll to the R for clean up.    Transfers                        Ambulation/Gait                  Stairs            Wheelchair Mobility    Modified Rankin (Stroke Patients Only)       Balance                                             Pertinent Vitals/Pain Pain Assessment Pain Assessment: Faces Faces Pain Scale: No hurt    Home Living Family/patient expects to be discharged to:: Skilled nursing facility                        Prior Function Prior Level of Function : Needs assist             Mobility Comments: Since having new weakness, pt has been staying in the bed ADLs Comments: Requires assist for ADLs.     Hand Dominance        Extremity/Trunk Assessment   Upper Extremity Assessment Upper Extremity Assessment: LUE deficits/detail LUE Deficits / Details: Increased tone in LUE    Lower Extremity Assessment Lower Extremity  Assessment: LLE deficits/detail LLE Deficits / Details: No AROM throughout       Communication   Communication: No difficulties  Cognition Arousal/Alertness: Awake/alert Behavior During Therapy: WFL for tasks assessed/performed Overall Cognitive Status: No family/caregiver present to determine baseline cognitive functioning                                 General Comments: Slow processing        General Comments      Exercises     Assessment/Plan    PT Assessment Patient needs continued PT services  PT Problem List Decreased strength;Decreased activity tolerance;Decreased balance;Decreased mobility;Decreased cognition;Decreased knowledge  of use of DME;Decreased safety awareness;Decreased knowledge of precautions;Impaired sensation       PT Treatment Interventions DME instruction;Functional mobility training;Therapeutic exercise;Therapeutic activities;Balance training;Patient/family education;Gait training    PT Goals (Current goals can be found in the Care Plan section)  Acute Rehab PT Goals Patient Stated Goal: to get stronger PT Goal Formulation: With patient Time For Goal Achievement: 09/03/21 Potential to Achieve Goals: Fair    Frequency Min 2X/week     Co-evaluation               AM-PAC PT "6 Clicks" Mobility  Outcome Measure Help needed turning from your back to your side while in a flat bed without using bedrails?: Total Help needed moving from lying on your back to sitting on the side of a flat bed without using bedrails?: Total Help needed moving to and from a bed to a chair (including a wheelchair)?: Total Help needed standing up from a chair using your arms (e.g., wheelchair or bedside chair)?: Total Help needed to walk in hospital room?: Total Help needed climbing 3-5 steps with a railing? : Total 6 Click Score: 6    End of Session   Activity Tolerance: Patient tolerated treatment well Patient left: in bed;with call bell/phone within reach (on stretcher in ED) Nurse Communication: Mobility status PT Visit Diagnosis: Other abnormalities of gait and mobility (R26.89);Muscle weakness (generalized) (M62.81);Difficulty in walking, not elsewhere classified (R26.2)    Time: 9169-4503 PT Time Calculation (min) (ACUTE ONLY): 19 min   Charges:   PT Evaluation $PT Eval Moderate Complexity: 1 Mod          Farley Ly, PT, DPT  Acute Rehabilitation Services  Office: [phone]   Lehman Prom 08/20/2021, 2:01 PM

## 2021-08-20 NOTE — Progress Notes (Signed)
PROGRESS NOTE  Steve HowardMaurice L Alvarado  DOB: 01-03-1956  PCP: Ellery PlunkBarnhouse, [name], MD ZOX:[id number]  DOA: 08/19/2021  LOS: 0 days  Hospital Day: 2  Brief narrative: Steve Alvarado is a [age] y.o. male with PMH significant for DM2, HTN, HLD, CAD/stent 2012, hypothyroidism, history of cocaine abuse and recent hospitalization for a stroke.   Patient was recently hospitalized 5/25 to 5/21 for acute stroke with left-sided weakness, status post TNK administration and was [facility]. He was sent back to ED within 24 hours with new concerns of left facial droop, left-sided weakness, slurring of speech and drooling of saliva.  In the ED, patient was hemodynamically stable. MRI brain revealed continued internal evolution of the recently identified right basal ganglia ischemic infarct without significant change in size, associated hemorrhage or mass effect.   Labs showed significant increase in creatinine in 24 hours duration to 2.98. Urinalysis with cloudy amber-colored urine with small amount of leukocytes, rare bacteria.  Bladder scan did not showed 220 cc of urine. Patient was admitted to hospitalist service for further evaluation and management.  Subjective: Patient was seen and examined this morning in the ED.  Elderly African-American male.  Lying on bed.  Sleeping, opens eyes and verbal command.  Oriented t x3. No family at bedside Last set of labs from last night with creatinine further worsening to 3.4   Principal Problem:   Acute kidney injury (HCC) Active Problems:   Basal ganglia stroke (HCC)   Type 2 diabetes mellitus with diabetic polyneuropathy, with long-term current use of insulin (HCC)   Coronary artery disease involving native coronary artery of native heart without angina pectoris   Essential hypertension   Cocaine abuse (HCC)   Hypothyroidism   GERD without esophagitis    Assessment and Plan: Acute kidney injury  -Creatinine normal at  baseline.  Progressive worsening of creatinine since 5/30 as trended below.  Last creatinine from last night 3.4.  Currently on IV fluid.  Repeat labs tomorrow. -Making urine -Losartan on hold. -Continue IV fluid Recent Labs    07/10/21 0606 08/12/21 1149 08/12/21 1157 08/14/21 0325 08/15/21 0125 08/16/21 0122 08/17/21 0023 08/18/21 0234 08/19/21 2345 08/19/21 2347  BUN 27* 33* 33* 14 19 19 23  25* 50* 48*  CREATININE 1.56* 1.59* 1.70* 0.97 1.11 1.09 1.38* 1.25* 2.98* 3.40*   Recent basal ganglia stroke  -Patient's transient neurologic symptoms may be secondary to acute kidney injury and mild uremia per neurology -MRI reveals no significant change compared to prior -Continue supportive care -PT eval -Continue regimen of aspirin, Plavix and Lipitor  Type 2 diabetes mellitus -A1c 7.9 on 08/13/2021 -On 5/31, patient was discharged on Lantus 10 units nightly and metformin 1000 mg twice daily. -Currently continued on Lantus and sliding scale insulin with Accu-Cheks.  Metformin on hold. Recent Labs  Lab 08/17/21 2053 08/18/21 0735 08/18/21 1153 08/19/21 2339 08/20/21 0815  GLUCAP 87 87 113* 106* 110*   CAD/stents -Currently chest pain-free.  Continue to monitor in telemetry.   -Continue carvedilol, aspirin, Plavix and statin.      Essential hypertension -PTA, patient was on Coreg 3.125 mg twice daily, amlodipine 10 mg daily, losartan 50 mg daily.   -Continue Coreg and amlodipine.  Keep losartan on hold.   -As needed IV hydralazine.   Cocaine abuse  -Patient reports remaining abstinent -Continuing to counsel cessation   Hypothyroidism -Continue Synthroid   Goals of care   Code Status: Full Code  Mobility: PT eval  Skin assessment:     Nutritional status:  There is no height or weight on file to calculate BMI.          Diet:  Diet Order             Diet heart healthy/carb modified Room service appropriate? Yes; Fluid consistency: Thin  Diet  effective now                   DVT prophylaxis:  enoxaparin (LOVENOX) injection 30 mg Start: 08/20/21 1000   Antimicrobials: None Fluid: NS at 125 mill per hour Consultants: Neurology Family Communication: None at bedside  Status is: Observation  Continue in-hospital care because: Continues to need IV fluid, renal function monitoring Level of care: Telemetry Medical   Dispo: The patient is from: SNF              Anticipated d/c is to: Hopefully back to SNF              Patient currently is not medically stable to d/c.   Difficult to place patient No     Infusions:   sodium chloride 125 mL/hr at 08/20/21 4650    Scheduled Meds:  amLODipine  10 mg Oral q morning   aspirin EC  81 mg Oral Daily   atorvastatin  40 mg Oral QPM   carvedilol  3.125 mg Oral BID WC   citalopram  20 mg Oral q morning   clopidogrel  75 mg Oral Daily   enoxaparin (LOVENOX) injection  30 mg Subcutaneous Daily   gabapentin  400 mg Oral TID   insulin aspart  0-15 Units Subcutaneous TID AC & HS   insulin aspart  3 Units Subcutaneous TID WC   insulin glargine-yfgn  10 Units Subcutaneous QHS   levothyroxine  75 mcg Oral QHS   pantoprazole  40 mg Oral q morning   sodium chloride flush  3 mL Intravenous Once   sodium chloride flush  3 mL Intravenous Q12H    PRN meds: acetaminophen **OR** acetaminophen, hydrALAZINE, LORazepam, ondansetron **OR** ondansetron (ZOFRAN) IV, polyethylene glycol   Antimicrobials: Anti-infectives (From admission, onward)    None       Objective: Vitals:   08/20/21 0815 08/20/21 1100  BP: 123/78 (!) 111/58  Pulse: 73 (!) 59  Resp: 19 13  Temp:    SpO2: 99% 97%   No intake or output data in the 24 hours ending 08/20/21 1135 There were no vitals filed for this visit. Weight change:  There is no height or weight on file to calculate BMI.   Physical Exam: General exam: Elderly African-American male.  Not in physical distress Skin: No rashes, lesions or  ulcers. HEENT: Atraumatic, normocephalic, no obvious bleeding Lungs: Clear to auscultation bilaterally CVS: Regular rate and rhythm, no murmur GI/Abd soft, nontender, nondistended, bowel sound present CNS: Opens eyes and verbal,, oriented x3, left-sided weakness at baseline Psychiatry: Mood appropriate Extremities: No pedal edema, no calf tenderness  Data Review: I have personally reviewed the laboratory data and studies available.  F/u labs ordered Unresulted Labs (From admission, onward)     Start     Ordered   08/21/21 0500  Comprehensive metabolic panel  Tomorrow morning,   R        08/20/21 0523   08/21/21 0500  Magnesium  Tomorrow morning,   R        08/20/21 0523   08/21/21 0500  CBC with Differential/Platelet  Tomorrow  morning,   R        08/20/21 0523   08/20/21 0511  Sodium, urine, random  Once,   R        08/20/21 0510   08/20/21 0511  Urea nitrogen, urine  Once,   R        08/20/21 0510   08/20/21 0511  Creatinine, urine, random  Once,   R        08/20/21 0510   08/20/21 0507  Rapid urine drug screen (hospital performed)  Add-on,   AD        08/20/21 0506   08/20/21 0004  Urine Culture  Once,   URGENT       Question:  Indication  Answer:  Bacteriuria screening (OB/GYN or Uro)   08/20/21 0003            Signed, Lorin Glass, MD [facility] 08/20/2021

## 2021-08-21 DIAGNOSIS — Z7982 Long term (current) use of aspirin: Secondary | ICD-10-CM | POA: Diagnosis not present

## 2021-08-21 DIAGNOSIS — K219 Gastro-esophageal reflux disease without esophagitis: Secondary | ICD-10-CM | POA: Diagnosis present

## 2021-08-21 DIAGNOSIS — R531 Weakness: Secondary | ICD-10-CM | POA: Diagnosis present

## 2021-08-21 DIAGNOSIS — Z7984 Long term (current) use of oral hypoglycemic drugs: Secondary | ICD-10-CM | POA: Diagnosis not present

## 2021-08-21 DIAGNOSIS — I69354 Hemiplegia and hemiparesis following cerebral infarction affecting left non-dominant side: Secondary | ICD-10-CM | POA: Diagnosis not present

## 2021-08-21 DIAGNOSIS — I10 Essential (primary) hypertension: Secondary | ICD-10-CM | POA: Diagnosis present

## 2021-08-21 DIAGNOSIS — E039 Hypothyroidism, unspecified: Secondary | ICD-10-CM | POA: Diagnosis present

## 2021-08-21 DIAGNOSIS — I2510 Atherosclerotic heart disease of native coronary artery without angina pectoris: Secondary | ICD-10-CM | POA: Diagnosis present

## 2021-08-21 DIAGNOSIS — E785 Hyperlipidemia, unspecified: Secondary | ICD-10-CM | POA: Diagnosis present

## 2021-08-21 DIAGNOSIS — Z794 Long term (current) use of insulin: Secondary | ICD-10-CM | POA: Diagnosis not present

## 2021-08-21 DIAGNOSIS — Z7902 Long term (current) use of antithrombotics/antiplatelets: Secondary | ICD-10-CM | POA: Diagnosis not present

## 2021-08-21 DIAGNOSIS — R29810 Facial weakness: Secondary | ICD-10-CM | POA: Diagnosis present

## 2021-08-21 DIAGNOSIS — B9620 Unspecified Escherichia coli [E. coli] as the cause of diseases classified elsewhere: Secondary | ICD-10-CM | POA: Diagnosis present

## 2021-08-21 DIAGNOSIS — Z7401 Bed confinement status: Secondary | ICD-10-CM | POA: Diagnosis not present

## 2021-08-21 DIAGNOSIS — R4781 Slurred speech: Secondary | ICD-10-CM | POA: Diagnosis present

## 2021-08-21 DIAGNOSIS — N179 Acute kidney failure, unspecified: Principal | ICD-10-CM | POA: Diagnosis present

## 2021-08-21 DIAGNOSIS — Z8249 Family history of ischemic heart disease and other diseases of the circulatory system: Secondary | ICD-10-CM | POA: Diagnosis not present

## 2021-08-21 DIAGNOSIS — N1831 Chronic kidney disease, stage 3a: Secondary | ICD-10-CM | POA: Diagnosis present

## 2021-08-21 DIAGNOSIS — Z79899 Other long term (current) drug therapy: Secondary | ICD-10-CM | POA: Diagnosis not present

## 2021-08-21 DIAGNOSIS — N390 Urinary tract infection, site not specified: Secondary | ICD-10-CM | POA: Diagnosis present

## 2021-08-21 DIAGNOSIS — E1142 Type 2 diabetes mellitus with diabetic polyneuropathy: Secondary | ICD-10-CM | POA: Diagnosis present

## 2021-08-21 DIAGNOSIS — Z888 Allergy status to other drugs, medicaments and biological substances status: Secondary | ICD-10-CM | POA: Diagnosis not present

## 2021-08-21 DIAGNOSIS — Z955 Presence of coronary angioplasty implant and graft: Secondary | ICD-10-CM | POA: Diagnosis not present

## 2021-08-21 DIAGNOSIS — Z7151 Drug abuse counseling and surveillance of drug abuser: Secondary | ICD-10-CM | POA: Diagnosis not present

## 2021-08-21 DIAGNOSIS — N189 Chronic kidney disease, unspecified: Secondary | ICD-10-CM | POA: Diagnosis present

## 2021-08-21 DIAGNOSIS — Z79890 Hormone replacement therapy (postmenopausal): Secondary | ICD-10-CM | POA: Diagnosis not present

## 2021-08-21 LAB — COMPREHENSIVE METABOLIC PANEL
ALT: 17 U/L (ref 0–44)
AST: 15 U/L (ref 15–41)
Albumin: 3.1 g/dL — ABNORMAL LOW (ref 3.5–5.0)
Alkaline Phosphatase: 68 U/L (ref 38–126)
Anion gap: 6 (ref 5–15)
BUN: 45 mg/dL — ABNORMAL HIGH (ref 8–23)
CO2: 21 mmol/L — ABNORMAL LOW (ref 22–32)
Calcium: 8.8 mg/dL — ABNORMAL LOW (ref 8.9–10.3)
Chloride: 111 mmol/L (ref 98–111)
Creatinine, Ser: 1.79 mg/dL — ABNORMAL HIGH (ref 0.61–1.24)
GFR, Estimated: 41 mL/min — ABNORMAL LOW (ref >60)
Glucose, Bld: 98 mg/dL (ref 70–99)
Potassium: 4.4 mmol/L (ref 3.5–5.1)
Sodium: 138 mmol/L (ref 135–145)
Total Bilirubin: 0.6 mg/dL (ref 0.3–1.2)
Total Protein: 6.1 g/dL — ABNORMAL LOW (ref 6.5–8.1)

## 2021-08-21 LAB — GLUCOSE, CAPILLARY
Glucose-Capillary: 96 mg/dL (ref 70–99)
Glucose-Capillary: 121 mg/dL — ABNORMAL HIGH (ref 70–99)
Glucose-Capillary: 129 mg/dL — ABNORMAL HIGH (ref 70–99)
Glucose-Capillary: 128 mg/dL — ABNORMAL HIGH (ref 70–99)

## 2021-08-21 LAB — CBC WITH DIFFERENTIAL/PLATELET
Abs Immature Granulocytes: 0.02 10*3/uL (ref 0.00–0.07)
Basophils Absolute: 0 10*3/uL (ref 0.0–0.1)
Basophils Relative: 1 %
Eosinophils Absolute: 0.5 10*3/uL (ref 0.0–0.5)
Eosinophils Relative: 8 %
HCT: 30.2 % — ABNORMAL LOW (ref 39.0–52.0)
Hemoglobin: 9.9 g/dL — ABNORMAL LOW (ref 13.0–17.0)
Immature Granulocytes: 0 %
Lymphocytes Relative: 22 %
Lymphs Abs: 1.4 10*3/uL (ref 0.7–4.0)
MCH: 29.4 pg (ref 26.0–34.0)
MCHC: 32.8 g/dL (ref 30.0–36.0)
MCV: 89.6 fL (ref 80.0–100.0)
Monocytes Absolute: 0.7 10*3/uL (ref 0.1–1.0)
Monocytes Relative: 11 %
Neutro Abs: 3.6 10*3/uL (ref 1.7–7.7)
Neutrophils Relative %: 58 %
Platelets: 185 10*3/uL (ref 150–400)
RBC: 3.37 MIL/uL — ABNORMAL LOW (ref 4.22–5.81)
RDW: 12.5 % (ref 11.5–15.5)
WBC: 6.2 10*3/uL (ref 4.0–10.5)
nRBC: 0 % (ref 0.0–0.2)

## 2021-08-21 LAB — URINE CULTURE: Culture: >=100000 — AB

## 2021-08-21 LAB — MAGNESIUM: Magnesium: 1.7 mg/dL (ref 1.7–2.4)

## 2021-08-21 MED ORDER — ENOXAPARIN SODIUM 40 MG/0.4ML IJ SOSY
40.0000 mg | PREFILLED_SYRINGE | Freq: Every day | INTRAMUSCULAR | Status: DC
  Administered 2021-08-22 – 2021-08-25 (×4): 40 mg via SUBCUTANEOUS
  Filled 2021-08-21 (×4): qty 0.4

## 2021-08-21 NOTE — Progress Notes (Signed)
PROGRESS NOTE  Steve Alvarado  DOB: Jul 01, 1955  PCP: Ellery Plunk, MD BWI:[id number]  DOA: 08/19/2021  LOS: 0 days  Hospital Day: 3  Brief narrative: Steve Alvarado is a 66 y.o. male with PMH significant for DM2, HTN, HLD, CAD/stent 2012, hypothyroidism, history of cocaine abuse and recent hospitalization for a stroke.   Patient was recently hospitalized 5/25 to 5/21 for acute stroke with left-sided weakness, status post TNK administration and was discharged to [facility] on 5/31. He was sent back to ED within 24 hours with new concerns of left facial droop, left-sided weakness, slurring of speech and drooling of saliva.  In the ED, patient was hemodynamically stable. MRI brain revealed continued internal evolution of the recently identified right basal ganglia ischemic infarct without significant change in size, associated hemorrhage or mass effect.   Labs showed significant increase in creatinine in 24 hours duration to 2.98. Urinalysis with cloudy amber-colored urine with small amount of leukocytes, rare bacteria.  Bladder scan did not showed 220 cc of urine. Patient was admitted to hospitalist service for further evaluation and management.  Subjective: Patient was seen and examined this morning. Lying down in bed.  Not in distress.  Alert, awake and oriented x3.  Left-sided weakness persisting.  Labs from this morning with improving creatinine.   Principal Problem:   Acute kidney injury (HCC) Active Problems:   Basal ganglia stroke (HCC)   Type 2 diabetes mellitus with diabetic polyneuropathy, with long-term current use of insulin (HCC)   Coronary artery disease involving native coronary artery of native heart without angina pectoris   Essential hypertension   Cocaine abuse (HCC)   Hypothyroidism   GERD without esophagitis    Assessment and Plan: Acute kidney injury  -Creatinine normal at baseline.  Creatinine progressively worsened since 5/30 to  peak at 3.4 on 6/1.  Gradually improving with IV fluid.  Losartan on hold.   Recent Labs    08/12/21 1149 08/12/21 1157 08/14/21 0325 08/15/21 0125 08/16/21 0122 08/17/21 0023 08/18/21 0234 08/19/21 2345 08/19/21 2347 08/21/21 0210  BUN 33* 33* 14 19 19 23  25* 50* 48* 45*  CREATININE 1.59* 1.70* 0.97 1.11 1.09 1.38* 1.25* 2.98* 3.40* 1.79*    Recent basal ganglia stroke  -Patient's transient neurologic symptoms may be secondary to acute kidney injury and mild uremia per neurology -MRI reveals no significant change compared to prior -Continue supportive care -PT eval -Continue regimen of aspirin, Plavix and Lipitor  Type 2 diabetes mellitus -A1c 7.9 on 08/13/2021 -On 5/31, patient was discharged on Lantus 10 units nightly and metformin 1000 mg twice daily. -Currently continued on Lantus and sliding scale insulin with Accu-Cheks.  Metformin on hold.  Continue the same. Recent Labs  Lab 08/20/21 1236 08/20/21 1630 08/20/21 2041 08/21/21 0731 08/21/21 1145  GLUCAP 128* 199* 110* 96 121*    CAD/stents -Currently chest pain-free.  Continue to monitor in telemetry.   -Continue carvedilol, aspirin, Plavix and statin.      Essential hypertension -PTA, patient was on Coreg 3.125 mg twice daily, amlodipine 10 mg daily, losartan 50 mg daily.   -Continue Coreg and amlodipine.  Continue to hold losartan. -As needed IV hydralazine.   Cocaine abuse  -Patient reports remaining abstinent -Continuing to counsel cessation   Hypothyroidism -Continue Synthroid   Goals of care   Code Status: Full Code    Mobility: PT eval  Skin assessment:     Nutritional status:  Body mass index is 25.45 kg/m.  Diet:  Diet Order             Diet heart healthy/carb modified Room service appropriate? Yes; Fluid consistency: Thin  Diet effective now                   DVT prophylaxis:  enoxaparin (LOVENOX) injection 40 mg Start: 08/22/21 1000   Antimicrobials:  None Fluid: NS at 125 mill per hour Consultants: Neurology Family Communication: None at bedside  Status is: Observation  Continue in-hospital care because: Continues to need IV fluid, renal function monitoring Level of care: Telemetry Medical   Dispo: The patient is from: SNF              Anticipated d/c is to: Hopefully back to SNF tomorrow              Patient currently is not medically stable to d/c.   Difficult to place patient No     Infusions:   sodium chloride 125 mL/hr at 08/21/21 1123    Scheduled Meds:  amLODipine  10 mg Oral q morning   aspirin EC  81 mg Oral Daily   atorvastatin  40 mg Oral QPM   carvedilol  3.125 mg Oral BID WC   citalopram  20 mg Oral q morning   clopidogrel  75 mg Oral Daily   [START ON 08/22/2021] enoxaparin (LOVENOX) injection  40 mg Subcutaneous Daily   gabapentin  200 mg Oral TID   insulin aspart  0-15 Units Subcutaneous TID AC & HS   insulin aspart  3 Units Subcutaneous TID WC   insulin glargine-yfgn  10 Units Subcutaneous QHS   levothyroxine  75 mcg Oral QHS   pantoprazole  40 mg Oral q morning   sodium chloride flush  3 mL Intravenous Once   sodium chloride flush  3 mL Intravenous Q12H    PRN meds: acetaminophen **OR** acetaminophen, hydrALAZINE, LORazepam, ondansetron **OR** ondansetron (ZOFRAN) IV, polyethylene glycol   Antimicrobials: Anti-infectives (From admission, onward)    None       Objective: Vitals:   08/21/21 0513 08/21/21 0907  BP: (!) 154/77 (!) 148/78  Pulse: 68 71  Resp: 18 (!) 1  Temp:  98 F (36.7 C)  SpO2: 99% 99%    Intake/Output Summary (Last 24 hours) at 08/21/2021 1349 Last data filed at 08/21/2021 0900 Gross per 24 hour  Intake 2489.06 ml  Output 1450 ml  Net 1039.06 ml   Filed Weights   08/20/21 1800  Weight: 89.9 kg   Weight change:  Body mass index is 25.45 kg/m.   Physical Exam: General exam: Elderly African-American male.  Not in physical distress. Skin: No rashes, lesions or  ulcers. HEENT: Atraumatic, normocephalic, no obvious bleeding Lungs: Clear to auscultation bilaterally CVS: Regular rate and rhythm, no murmur GI/Abd soft, nontender, nondistended, bowel sound present CNS: Alert, awake, oriented x3.  Left-sided hemiplegia from previous stroke  psychiatry: Mood appropriate Extremities: No pedal edema, no calf tenderness  Data Review: I have personally reviewed the laboratory data and studies available.  F/u labs ordered Unresulted Labs (From admission, onward)     Start     Ordered   08/20/21 0511  Sodium, urine, random  Once,   R        08/20/21 0510   08/20/21 0511  Urea nitrogen, urine  Once,   R        08/20/21 0510   08/20/21 0511  Creatinine, urine, random  Once,   R  08/20/21 0510   08/20/21 0507  Rapid urine drug screen (hospital performed)  Add-on,   AD        08/20/21 0506            Signed, Lorin GlassBinaya Burke Terry, MD [facility] 08/21/2021

## 2021-08-22 LAB — BASIC METABOLIC PANEL
Anion gap: 6 (ref 5–15)
BUN: 18 mg/dL (ref 8–23)
CO2: 22 mmol/L (ref 22–32)
Calcium: 8.9 mg/dL (ref 8.9–10.3)
Chloride: 109 mmol/L (ref 98–111)
Creatinine, Ser: 1.01 mg/dL (ref 0.61–1.24)
GFR, Estimated: >60 mL/min (ref >60)
Glucose, Bld: 164 mg/dL — ABNORMAL HIGH (ref 70–99)
Potassium: 4.7 mmol/L (ref 3.5–5.1)
Sodium: 137 mmol/L (ref 135–145)

## 2021-08-22 LAB — GLUCOSE, CAPILLARY
Glucose-Capillary: 132 mg/dL — ABNORMAL HIGH (ref 70–99)
Glucose-Capillary: 129 mg/dL — ABNORMAL HIGH (ref 70–99)
Glucose-Capillary: 112 mg/dL — ABNORMAL HIGH (ref 70–99)
Glucose-Capillary: 161 mg/dL — ABNORMAL HIGH (ref 70–99)

## 2021-08-22 MED ORDER — POLYETHYLENE GLYCOL 3350 17 G PO PACK: 17.0000 g | PACK | Freq: Every day | ORAL | 0 refills | Status: AC | PRN

## 2021-08-22 MED ORDER — LOSARTAN POTASSIUM 50 MG PO TABS
50.0000 mg | ORAL_TABLET | Freq: Every day | ORAL | Status: DC
  Administered 2021-08-22 – 2021-08-25 (×4): 50 mg via ORAL
  Filled 2021-08-22 (×4): qty 1

## 2021-08-22 NOTE — Discharge Summary (Signed)
Physician Discharge Summary  Steve Alvarado WGN:[id number] DOB: Apr 29, 1955 DOA: 08/19/2021  PCP: Ellery PlunkBarnhouse, [name], MD  Admit date: 08/19/2021 Discharge date: 08/23/2021  Admitted From: SNF Discharge disposition: Back to SNF   Brief narrative: Steve Alvarado is a 66 y.o. male with PMH significant for DM2, HTN, HLD, CAD/stent 2012, hypothyroidism, history of cocaine abuse and recent hospitalization for a stroke.   Patient was recently hospitalized 5/25 to 5/21 for acute stroke with left-sided weakness, status post TNK administration and was [facility] on 5/[facility]. He was sent back to ED within 24 hours with new concerns of left facial droop, left-sided weakness, slurring of speech and drooling of saliva.  In the ED, patient was hemodynamically stable. MRI brain revealed continued internal evolution of the recently identified right basal ganglia ischemic infarct without significant change in size, associated hemorrhage or mass effect.   Labs showed significant increase in creatinine in 24 hours duration to 2.98. Urinalysis with cloudy amber-colored urine with small amount of leukocytes, rare bacteria.  Bladder scan did not showed 220 cc of urine. Patient was admitted to hospitalist service for further evaluation and management.  Subjective: Patient was seen and examined this morning.  Lying down in bed.  Not in distress.  No new symptoms.  Blood pressure improving.  Creatinine much better. Okay to discharge back to facility today.  Principal Problem:   Acute kidney injury (HCC) Active Problems:   Basal ganglia stroke (HCC)   Type 2 diabetes mellitus with diabetic polyneuropathy, with long-term current use of insulin (HCC)   Coronary artery disease involving native coronary artery of native heart without angina pectoris   Essential hypertension   Cocaine abuse (HCC)   Hypothyroidism   GERD without esophagitis   AKI (acute kidney injury) (HCC)     Assessment and Plan: Acute kidney injury  -Creatinine was normal at baseline.  Creatinine progressively worsened since 5/30 to peak at 3.4 on 6/1.  Gradually improved down to normal with IV fluid.  Losartan was initially held, later resumed.  Recent Labs    08/12/21 1157 08/14/21 0325 08/15/21 0125 08/16/21 0122 08/17/21 0023 08/18/21 0234 08/19/21 2345 08/19/21 2347 08/21/21 0210 08/22/21 0839  BUN 33* 14 19 19 23  25* 50* 48* 45* 18  CREATININE 1.70* 0.97 1.11 1.09 1.38* 1.25* 2.98* 3.40* 1.79* 1.01   Recent basal ganglia stroke  -Patient's transient neurologic symptoms may be secondary to acute kidney injury and mild uremia per neurology -MRI did not show significant change compared to prior -Continue supportive care -Continue regimen of aspirin, Plavix and Lipitor  Type 2 diabetes mellitus -A1c 7.9 on 08/13/2021 -On 5/31, patient was discharged on Lantus 10 units nightly and metformin 1000 mg twice daily. -Continue same regimen.  CAD/stents -Currently chest pain-free.  Continue to monitor in telemetry.   -Continue carvedilol, aspirin, Plavix and statin.      Essential hypertension -PTA, patient was on Coreg 3.125 mg twice daily, amlodipine 10 mg daily, losartan 50 mg daily.   -Losartan was initially held for AKI.  Later resumed.  Continue all 3 at discharge.  Blood pressure improving.   Cocaine abuse  -Patient reports remaining abstinent -Continuing to counsel cessation   Hypothyroidism -Continue Synthroid   Goals of care   Code Status: Full Code    Mobility: PT eval  Skin assessment:     Nutritional status:  Body mass index is 25.45 kg/m.           Wounds:  -  Discharge Exam:   Vitals:   08/22/21 1649 08/22/21 2039 08/23/21 0522 08/23/21 1149  BP: (!) 171/79 (!) 158/78 (!) 153/83 (!) 157/76  Pulse: 63 66 65 61  Resp: 18 16 16 20   Temp: 98.2 F (36.8 C) 98.3 F (36.8 C) 98.3 F (36.8 C) 98.4 F (36.9 C)  TempSrc:  Oral Oral Oral   SpO2: 98% 97% 99% 100%  Weight:      Height:        Body mass index is 25.45 kg/m.  General exam: Elderly African-American male.  Not in physical distress. Skin: No rashes, lesions or ulcers. HEENT: Atraumatic, normocephalic, no obvious bleeding Lungs: Clear to auscultation bilaterally CVS: Regular rate and rhythm, no murmur GI/Abd soft, nontender, nondistended, bowel sound present CNS: Alert, awake, oriented x3.  Left-sided hemiplegia from previous stroke  psychiatry: Mood appropriate Extremities: No pedal edema, no calf tenderness  Follow ups:    Follow-up Information     [name], , MD Follow up.   Specialty: Family Medicine Contact information: [address] [facility] [phone]                 Discharge Instructions:   Discharge Instructions     Call MD for:  difficulty breathing, headache or visual disturbances   Complete by: As directed    Call MD for:  extreme fatigue   Complete by: As directed    Call MD for:  hives   Complete by: As directed    Call MD for:  persistant dizziness or light-headedness   Complete by: As directed    Call MD for:  persistant nausea and vomiting   Complete by: As directed    Call MD for:  severe uncontrolled pain   Complete by: As directed    Call MD for:  temperature >100.4   Complete by: As directed    Diet - low sodium heart healthy   Complete by: As directed    Diet Carb Modified   Complete by: As directed    Discharge instructions   Complete by: As directed    Discharge instructions for diabetes mellitus: Check blood sugar 3 times a day and bedtime at home. If blood sugar running above 200 or less than 70 please call your MD to adjust insulin. If you notice signs and symptoms of hypoglycemia (low blood sugar) like jitteriness, confusion, thirst, tremor and sweating, please check blood sugar, drink sugary drink/biscuits/sweets to increase sugar  level and call MD or return to ER.    General discharge instructions: Follow with Primary MD [phone] Ralene Bathe, MD in 7 days  Please request your PCP  to go over your hospital tests, procedures, radiology results at the follow up. Please get your medicines reviewed and adjusted.  Your PCP may decide to repeat certain labs or tests as needed. Do not drive, operate heavy machinery, perform activities at heights, swimming or participation in water activities or provide baby sitting services if your were admitted for syncope or siezures until you have seen by Primary MD or a Neurologist and advised to do so again. [place] Controlled Substance Reporting System database was reviewed. Do not drive, operate heavy machinery, perform activities at heights, swim, participate in water activities or provide baby-sitting services while on medications for pain, sleep and mood until your outpatient physician has reevaluated you and advised to do so again.  You are strongly recommended to comply with the dose, frequency and  duration of prescribed medications. Activity: As tolerated with Full fall precautions use walker/cane & assistance as needed Avoid using any recreational substances like cigarette, tobacco, alcohol, or non-prescribed drug. If you experience worsening of your admission symptoms, develop shortness of breath, life threatening emergency, suicidal or homicidal thoughts you must seek medical attention immediately by calling 911 or calling your MD immediately  if symptoms less severe. You must read complete instructions/literature along with all the possible adverse reactions/side effects for all the medicines you take and that have been prescribed to you. Take any new medicine only after you have completely understood and accepted all the possible adverse reactions/side effects.  Wear Seat belts while driving. You were cared for by a hospitalist during your hospital stay. If you have any  questions about your discharge medications or the care you received while you were in the hospital after you are discharged, you can call the unit and ask to speak with the hospitalist or the covering physician. Once you are discharged, your primary care physician will handle any further medical issues. Please note that NO REFILLS for any discharge medications will be authorized once you are discharged, as it is imperative that you return to your primary care physician (or establish a relationship with a primary care physician if you do not have one).   Increase activity slowly   Complete by: As directed        Discharge Medications:   Allergies as of 08/23/2021       Reactions   Ace Inhibitors Cough   Reaction not listed on MAR        Medication List     STOP taking these medications    traMADol 50 MG tablet Commonly known as: ULTRAM       TAKE these medications    amLODipine 10 MG tablet Commonly known as: NORVASC Take 1 tablet (10 mg total) by mouth every morning.   lidocaine 4 % Place 1 patch onto the skin daily. Apply one patch topically to left abdomen every morning - leave on for 12 hours. 12 hours on and 12 hours off.   Aspercreme Lidocaine 4 % Generic drug: lidocaine 1 patch See admin instructions. Apply 1 patch topically daily in the morning   aspirin EC 81 MG tablet Take 1 tablet (81 mg total) by mouth daily. Swallow whole.   atorvastatin 40 MG tablet Commonly known as: LIPITOR Take 1 tablet (40 mg total) by mouth every evening. What changed: when to take this   carvedilol 3.125 MG tablet Commonly known as: COREG Take 1 tablet (3.125 mg total) by mouth 2 (two) times daily with a meal. What changed: when to take this   citalopram 20 MG tablet Commonly known as: CELEXA Take 1 tablet (20 mg total) by mouth daily.   clopidogrel 75 MG tablet Commonly known as: PLAVIX Take 1 tablet (75 mg total) by mouth daily. What changed: when to take this    gabapentin 400 MG capsule Commonly known as: NEURONTIN Take 400 mg by mouth 3 (three) times daily. 8am, 12pm 4pm   HumaLOG 100 UNIT/ML injection Generic drug: insulin lispro Inject 0.03 mLs (3 Units total) into the skin 3 (three) times daily with meals.   insulin glargine-yfgn 100 UNIT/ML injection Commonly known as: SEMGLEE Inject 0.1 mLs (10 Units total) into the skin at bedtime.   levothyroxine 75 MCG tablet Commonly known as: SYNTHROID Take 75 mcg by mouth at bedtime. 2200   losartan 50 MG tablet Commonly known  as: COZAAR Take 50 mg by mouth daily.   metFORMIN 1000 MG tablet Commonly known as: GLUCOPHAGE Take 1,000 mg by mouth 2 (two) times daily.   pantoprazole 40 MG tablet Commonly known as: PROTONIX Take 40 mg by mouth every morning. What changed: Another medication with the same name was removed. Continue taking this medication, and follow the directions you see here.   polyethylene glycol 17 g packet Commonly known as: MIRALAX / GLYCOLAX Take 17 g by mouth daily as needed for mild constipation.         The results of significant diagnostics from this hospitalization (including imaging, microbiology, ancillary and laboratory) are listed below for reference.    Procedures and Diagnostic Studies:   MR BRAIN WO CONTRAST  Result Date: 08/20/2021 CLINICAL DATA:  Initial evaluation for neuro deficit, stroke suspected. EXAM: MRI HEAD WITHOUT CONTRAST TECHNIQUE: Multiplanar, multiecho pulse sequences of the brain and surrounding structures were obtained without intravenous contrast. COMPARISON:  CT from 08/19/2021 and brain MRI from 08/13/2021. FINDINGS: Brain: Cerebral volume within normal limits. Chronic microvascular ischemic disease involving the periventricular deep white matter both cerebral hemispheres as well as the pons noted, stable. Few scattered small remote bilateral cerebellar infarcts again noted. Small remote lacunar infarct present at the left thalamus.  There has been continued interval evolution of recently identified ischemic infarct involving the posterior right basal ganglia, not significantly changed in size or morphology as compared to previous. No associated hemorrhage or significant regional mass effect. No evidence for significant interval expansion or new infarction. No other acute or subacute ischemic changes elsewhere within the brain. Gray-white matter differentiation otherwise maintained. No acute intracranial hemorrhage. Single punctate chronic microhemorrhage noted within the right frontal centrum semi ovale, likely small vessel related. No mass lesion, midline shift or mass effect. No hydrocephalus or extra-axial fluid collection. Pituitary gland suprasellar region within normal limits. Vascular: Major intracranial vascular flow voids are maintained. Skull and upper cervical spine: Craniocervical junction normal. Bone mineral. No scalp soft tissue abnormality. Sinuses/Orbits: Globes orbital soft tissues within normal limits. Scattered mucosal thickening noted throughout the paranasal sinuses. Small right greater than left mastoid effusions. Visualized nasopharynx within normal limits. Other: None. IMPRESSION: 1. Continued interval evolution of recently identified right basal ganglia ischemic infarct, not significantly changed in size or morphology as compared to previous. No associated hemorrhage or significant regional mass effect. 2. No other new acute intracranial abnormality. 3. Underlying chronic microvascular ischemic disease with a few small remote left thalamic and cerebellar infarcts, stable. Electronically Signed   By: Rise Mu M.D.   On: 08/20/2021 03:15   DG Chest Port 1 View  Result Date: 08/20/2021 CLINICAL DATA:  Stroke. EXAM: PORTABLE CHEST 1 VIEW COMPARISON:  Chest x-ray 07/08/2021 FINDINGS: The heart size and mediastinal contours are within normal limits. Both lungs are clear. The visualized skeletal structures are  unremarkable. IMPRESSION: No active disease. Electronically Signed   By: Darliss Cheney M.D.   On: 08/20/2021 00:19   CT HEAD CODE STROKE WO CONTRAST  Result Date: 08/19/2021 CLINICAL DATA:  Code stroke. Initial evaluation for neuro deficit, stroke suspected. EXAM: CT HEAD WITHOUT CONTRAST TECHNIQUE: Contiguous axial images were obtained from the base of the skull through the vertex without intravenous contrast. RADIATION DOSE REDUCTION: This exam was performed according to the departmental dose-optimization program which includes automated exposure control, adjustment of the mA and/or kV according to patient size and/or use of iterative reconstruction technique. COMPARISON:  Prior MRI from 08/13/2021. FINDINGS: Brain: Normal  expected interval evolution of recently identified infarct involving the posterior right basal ganglia, relatively stable in size and distribution as compared to prior MRI. No significant regional mass effect or evidence for hemorrhagic transformation. No other new large vessel territory infarct. No other acute intracranial hemorrhage. Underlying atrophy with chronic microvascular ischemic disease with a few small remote cerebellar infarcts noted. No mass lesion or midline shift. No hydrocephalus or extra-axial fluid collection. Vascular: No hyperdense vessel. Calcified atherosclerosis present at skull base. Skull: Scalp soft tissues and calvarium demonstrate no acute finding. Sinuses/Orbits: Globes orbital soft tissues within normal limits. Mild mucosal thickening present about the maxillary sinuses. Paranasal sinuses are otherwise largely clear. No significant mastoid effusion. Other: None. ASPECTS [facility] Stroke Program Early CT Score) - Ganglionic level infarction (caudate, lentiform nuclei, internal capsule, insula, M1-M3 cortex): 7 - Supraganglionic infarction (M4-M6 cortex): 3 Total score (0-10 with 10 being normal): 10 IMPRESSION: 1. Normal expected interval evolution of previously  identified subacute infarct involving the right basal ganglia. No evidence for hemorrhagic transformation or significant mass effect. 2. No other new acute intracranial abnormality. 3. ASPECTS is 10. 4. Underlying atrophy with chronic small vessel ischemic disease with a few small remote cerebellar infarcts. These results were communicated to Dr. Iver Nestle at 11:53 pm on 08/19/2021 by text page via the [facility] messaging system. Electronically Signed   By: Rise Mu M.D.   On: 08/19/2021 23:56     Labs:   Basic Metabolic Panel: Recent Labs  Lab 08/17/21 0023 08/18/21 0234 08/19/21 2345 08/19/21 2347 08/21/21 0210 08/22/21 0839  NA 137 137 139 139 138 137  K 3.9 4.0 4.4 4.4 4.4 4.7  CL 108 108 109 108 111 109  CO2 22 23 20*  --  21* 22  GLUCOSE 96 85 108* 105* 98 164*  BUN 23 25* 50* 48* 45* 18  CREATININE 1.38* 1.25* 2.98* 3.40* 1.79* 1.01  CALCIUM 9.1 9.4 9.1  --  8.8* 8.9  MG  --   --   --   --  1.7  --    GFR Estimated Creatinine Clearance: 82.5 mL/min (by C-G formula based on SCr of 1.01 mg/dL). Liver Function Tests: Recent Labs  Lab 08/19/21 2345 08/21/21 0210  AST 19 15  ALT 18 17  ALKPHOS 67 68  BILITOT 0.5 0.6  PROT 6.7 6.1*  ALBUMIN 3.5 3.1*   No results for input(s): LIPASE, AMYLASE in the last 168 hours. No results for input(s): AMMONIA in the last 168 hours. Coagulation profile Recent Labs  Lab 08/19/21 2345  INR 1.0    CBC: Recent Labs  Lab 08/17/21 0023 08/18/21 0234 08/19/21 2345 08/19/21 2347 08/21/21 0210  WBC 6.1 6.5 6.5  --  6.2  NEUTROABS  --   --  3.5  --  3.6  HGB 11.0* 11.9* 10.9* 10.5* 9.9*  HCT 31.8* 34.7* 33.8* 31.0* 30.2*  MCV 86.9 86.3 91.8  --  89.6  PLT 199 235 214  --  185   Cardiac Enzymes: No results for input(s): CKTOTAL, CKMB, CKMBINDEX, TROPONINI in the last 168 hours. BNP: Invalid input(s): POCBNP CBG: Recent Labs  Lab 08/22/21 1129 08/22/21 1645 08/22/21 2041 08/23/21 0730 08/23/21 1148  GLUCAP 129*  112* 161* 139* 158*   D-Dimer No results for input(s): DDIMER in the last 72 hours. Hgb A1c No results for input(s): HGBA1C in the last 72 hours. Lipid Profile No results for input(s): CHOL, HDL, LDLCALC, TRIG, CHOLHDL, LDLDIRECT in the last 72 hours. Thyroid function  studies No results for input(s): TSH, T4TOTAL, T3FREE, THYROIDAB in the last 72 hours.  Invalid input(s): FREET3 Anemia work up No results for input(s): VITAMINB12, FOLATE, FERRITIN, TIBC, IRON, RETICCTPCT in the last 72 hours. Microbiology Recent Results (from the past 240 hour(s))  Urine Culture     Status: Abnormal   Collection Time: 08/20/21 12:04 AM   Specimen: Urine, Clean Catch  Result Value Ref Range Status   Specimen Description URINE, CLEAN CATCH  Final   Special Requests   Final    NONE Performed at [facility], [address] [place], [place] [address]    Culture >=100,000 COLONIES/mL PROTEUS MIRABILIS (A)  Final   Report Status 08/21/2021 FINAL  Final   Organism ID, Bacteria PROTEUS MIRABILIS (A)  Final      Susceptibility   Proteus mirabilis - MIC*    AMPICILLIN <=2 SENSITIVE Sensitive     CEFAZOLIN 8 SENSITIVE Sensitive     CEFEPIME <=0.12 SENSITIVE Sensitive     CEFTRIAXONE <=0.25 SENSITIVE Sensitive     CIPROFLOXACIN <=0.25 SENSITIVE Sensitive     GENTAMICIN <=1 SENSITIVE Sensitive     IMIPENEM 2 SENSITIVE Sensitive     NITROFURANTOIN 128 RESISTANT Resistant     TRIMETH/SULFA <=20 SENSITIVE Sensitive     AMPICILLIN/SULBACTAM <=2 SENSITIVE Sensitive     PIP/TAZO <=4 SENSITIVE Sensitive     * >=100,000 COLONIES/mL PROTEUS MIRABILIS    Time coordinating discharge: 35 minutes  Signed: Timothy Trudell  [facility] 08/23/2021, 12:57 PM

## 2021-08-22 NOTE — Plan of Care (Signed)
  Problem: Health Behavior/Discharge Planning: Goal: Ability to identify and utilize available resources and services will improve Outcome: Progressing   Problem: Health Behavior/Discharge Planning: Goal: Ability to manage health-related needs will improve Outcome: Progressing   Problem: Clinical Measurements: Goal: Diagnostic test results will improve Outcome: Progressing

## 2021-08-22 NOTE — Plan of Care (Signed)
  Problem: Education: Goal: Ability to describe self-care measures that may prevent or decrease complications (Diabetes Survival Skills Education) will improve Outcome: Completed/Met   Problem: Coping: Goal: Ability to adjust to condition or change in health will improve Outcome: Completed/Met   Problem: Fluid Volume: Goal: Ability to maintain a balanced intake and output will improve Outcome: Completed/Met   Problem: Health Behavior/Discharge Planning: Goal: Ability to manage health-related needs will improve Outcome: Completed/Met   Problem: Metabolic: Goal: Ability to maintain appropriate glucose levels will improve Outcome: Completed/Met   Problem: Nutritional: Goal: Maintenance of adequate nutrition will improve Outcome: Completed/Met   Problem: Skin Integrity: Goal: Risk for impaired skin integrity will decrease Outcome: Completed/Met   Problem: Tissue Perfusion: Goal: Adequacy of tissue perfusion will improve Outcome: Completed/Met

## 2021-08-22 NOTE — Progress Notes (Addendum)
Unable to reach [facility] admissions over weekend but spoke to pt's nurse at [facility] [place], who reports pt is a [facility] resident.   [employer] auth request submitted and appropriate clinicals faxed, ref # [id number]. Per [employer] rep, pt was eventually approved for SNF after initial denial last admission and auth was valid 5/31-6/5. Weekday SW to f/u with [facility] admissions Monday to confirm status. MD updated.   Dellie Burns, MSW, LCSW [phone] (coverage)

## 2021-08-22 NOTE — Progress Notes (Signed)
PROGRESS NOTE  Steve Alvarado  DOB: Dec 21, 1955  PCP: Ellery Plunk, MD VFI:[id number]  DOA: 08/19/2021  LOS: 1 day  Hospital Day: 4  Brief narrative: Steve Alvarado is a 67 y.o. male with PMH significant for DM2, HTN, HLD, CAD/stent 2012, hypothyroidism, history of cocaine abuse and recent hospitalization for a stroke.   Patient was recently hospitalized 5/25 to 5/21 for acute stroke with left-sided weakness, status post TNK administration and was discharged to [facility] on 5/31. He was sent back to ED within 24 hours with new concerns of left facial droop, left-sided weakness, slurring of speech and drooling of saliva.  In the ED, patient was hemodynamically stable. MRI brain revealed continued internal evolution of the recently identified right basal ganglia ischemic infarct without significant change in size, associated hemorrhage or mass effect.   Labs showed significant increase in creatinine in 24 hours duration to 2.98. Urinalysis with cloudy amber-colored urine with small amount of leukocytes, rare bacteria.  Bladder scan did not showed 220 cc of urine. Patient was admitted to hospitalist service for further evaluation and management.  Subjective: Patient was seen and examined this morning. Lying down in bed.  Not in distress.  No new symptoms.  Feels better.  Labs from this morning showed improvement in creatinine.   Principal Problem:   Acute kidney injury (HCC) Active Problems:   Basal ganglia stroke (HCC)   Type 2 diabetes mellitus with diabetic polyneuropathy, with long-term current use of insulin (HCC)   Coronary artery disease involving native coronary artery of native heart without angina pectoris   Essential hypertension   Cocaine abuse (HCC)   Hypothyroidism   GERD without esophagitis   AKI (acute kidney injury) (HCC)    Assessment and Plan: Acute kidney injury  -Creatinine was normal at baseline.  Creatinine progressively worsened  since 5/30 to peak at 3.4 on 6/1.  Gradually improved down to normal with IV fluid.   Recent Labs    08/12/21 1157 08/14/21 0325 08/15/21 0125 08/16/21 0122 08/17/21 0023 08/18/21 0234 08/19/21 2345 08/19/21 2347 08/21/21 0210 08/22/21 0839  BUN 33* 14 19 19 23  25* 50* 48* 45* 18  CREATININE 1.70* 0.97 1.11 1.09 1.38* 1.25* 2.98* 3.40* 1.79* 1.01   Recent basal ganglia stroke  -Patient's transient neurologic symptoms may be secondary to acute kidney injury and mild uremia per neurology -MRI did not show significant change compared to prior -Continue supportive care -PT eval -Continue regimen of aspirin, Plavix and Lipitor  Type 2 diabetes mellitus -A1c 7.9 on 08/13/2021 -On 5/31, patient was discharged on Lantus 10 units nightly and metformin 1000 mg twice daily. -Currently continued on Lantus and sliding scale insulin with Accu-Cheks.  Metformin on hold.  Recent Labs  Lab 08/21/21 1145 08/21/21 1623 08/21/21 2159 08/22/21 0718 08/22/21 1129  GLUCAP 121* 129* 128* 132* 129*   CAD/stents -Currently chest pain-free.  Continue to monitor in telemetry.   -Continue carvedilol, aspirin, Plavix and statin.      Essential hypertension -PTA, patient was on Coreg 3.125 mg twice daily, amlodipine 10 mg daily, losartan 50 mg daily.   -Continue Coreg and amlodipine.  Losartan resumed this morning -As needed IV hydralazine.   Cocaine abuse  -Patient reports remaining abstinent -Continuing to counsel cessation   Hypothyroidism -Continue Synthroid   Goals of care   Code Status: Full Code    Mobility: PT eval  Skin assessment:     Nutritional status:  Body mass index is 25.45  kg/m.          Diet:  Diet Order             Diet Carb Modified           Diet - low sodium heart healthy           Diet heart healthy/carb modified Room service appropriate? Yes; Fluid consistency: Thin  Diet effective now                   DVT prophylaxis:  enoxaparin  (LOVENOX) injection 40 mg Start: 08/22/21 1000   Antimicrobials: None Fluid: Can stop IV fluid today Consultants: Neurology Family Communication: None at bedside  Status is: Observation  Continue in-hospital care because: Stable for discharge.  Pending SNF authorization by insurance Level of care: Telemetry Medical   Dispo: The patient is from: SNF              Anticipated d/c is to: Pending SNF authorization by insurance              Patient currently not medically stable to d/c.   Difficult to place patient No     Infusions:     Scheduled Meds:  amLODipine  10 mg Oral q morning   aspirin EC  81 mg Oral Daily   atorvastatin  40 mg Oral QPM   carvedilol  3.125 mg Oral BID WC   citalopram  20 mg Oral q morning   clopidogrel  75 mg Oral Daily   enoxaparin (LOVENOX) injection  40 mg Subcutaneous Daily   gabapentin  200 mg Oral TID   insulin aspart  0-15 Units Subcutaneous TID AC & HS   insulin aspart  3 Units Subcutaneous TID WC   insulin glargine-yfgn  10 Units Subcutaneous QHS   levothyroxine  75 mcg Oral QHS   losartan  50 mg Oral Daily   pantoprazole  40 mg Oral q morning   sodium chloride flush  3 mL Intravenous Once   sodium chloride flush  3 mL Intravenous Q12H    PRN meds: acetaminophen **OR** acetaminophen, hydrALAZINE, LORazepam, ondansetron **OR** ondansetron (ZOFRAN) IV, polyethylene glycol   Antimicrobials: Anti-infectives (From admission, onward)    None       Objective: Vitals:   08/22/21 0514 08/22/21 0915  BP: (!) 172/75 (!) 183/84  Pulse: 65 67  Resp: 16 18  Temp: 98.4 F (36.9 C) 98.2 F (36.8 C)  SpO2: 100% 100%    Intake/Output Summary (Last 24 hours) at 08/22/2021 1250 Last data filed at 08/22/2021 1047 Gross per 24 hour  Intake 4444.9 ml  Output 4775 ml  Net -330.1 ml   Filed Weights   08/20/21 1800  Weight: 89.9 kg   Weight change:  Body mass index is 25.45 kg/m.   Physical Exam: General exam: Elderly African-American  male.  Not in physical distress. Skin: No rashes, lesions or ulcers. HEENT: Atraumatic, normocephalic, no obvious bleeding Lungs: Clear to auscultation bilaterally CVS: Regular rate and rhythm, no murmur GI/Abd soft, nontender, nondistended, bowel sound present CNS: Alert, awake, oriented x3.  Left-sided hemiplegia from previous stroke  psychiatry: Mood appropriate Extremities: No pedal edema, no calf tenderness  Data Review: I have personally reviewed the laboratory data and studies available.  F/u labs ordered Unresulted Labs (From admission, onward)     Start     Ordered   08/20/21 0511  Sodium, urine, random  Once,   R        08/20/21  0510   08/20/21 0511  Urea nitrogen, urine  Once,   R        08/20/21 0510   08/20/21 0511  Creatinine, urine, random  Once,   R        08/20/21 0510   08/20/21 0507  Rapid urine drug screen (hospital performed)  Add-on,   AD        08/20/21 0506            Signed, Lorin GlassBinaya Adwoa Axe, MD [facility] 08/22/2021

## 2021-08-23 LAB — GLUCOSE, CAPILLARY
Glucose-Capillary: 139 mg/dL — ABNORMAL HIGH (ref 70–99)
Glucose-Capillary: 158 mg/dL — ABNORMAL HIGH (ref 70–99)
Glucose-Capillary: 145 mg/dL — ABNORMAL HIGH (ref 70–99)
Glucose-Capillary: 127 mg/dL — ABNORMAL HIGH (ref 70–99)

## 2021-08-23 MED ORDER — TRAMADOL HCL 50 MG PO TABS
50.0000 mg | ORAL_TABLET | Freq: Once | ORAL | Status: AC | PRN
  Administered 2021-08-23: 50 mg via ORAL
  Filled 2021-08-23: qty 1

## 2021-08-23 NOTE — TOC Progression Note (Addendum)
Transition of Care [facility]) - Progression Note    Patient Details  Name: Steve Alvarado MRN: [id number] Date of Birth: 06-09-1955  Transition [facility]) CM/SW Contact  Carley Hammed, [place] Phone Number: 08/23/2021, 11:01 AM  Clinical Narrative:     CSW spoke with Liaison at [facility] who stated they can accept pt today as long as Berkley Harvey is approved. Authorization is still pending at this time. TOC will continue to follow for DC needs.  CSW followed up with Vesta Mixer request is still under review.    3:30 PM Authorization still under review.  4:45 Auth still pending. Will attempt to DC tomorrow when approved.  Expected Discharge Plan and Services           Expected Discharge Date: 08/22/21                                     Social Determinants of Health (SDOH) Interventions    Readmission Risk Interventions     View : No data to display.

## 2021-08-24 LAB — GLUCOSE, CAPILLARY
Glucose-Capillary: 173 mg/dL — ABNORMAL HIGH (ref 70–99)
Glucose-Capillary: 93 mg/dL (ref 70–99)
Glucose-Capillary: 172 mg/dL — ABNORMAL HIGH (ref 70–99)
Glucose-Capillary: 118 mg/dL — ABNORMAL HIGH (ref 70–99)

## 2021-08-24 MED ORDER — OXYCODONE-ACETAMINOPHEN 5-325 MG PO TABS
1.0000 | ORAL_TABLET | Freq: Four times a day (QID) | ORAL | Status: DC | PRN
  Administered 2021-08-24 (×2): 1 via ORAL
  Filled 2021-08-24 (×2): qty 1

## 2021-08-24 MED ORDER — OXYCODONE-ACETAMINOPHEN 5-325 MG PO TABS: 1.0000 | ORAL_TABLET | Freq: Four times a day (QID) | ORAL | 0 refills | Status: AC | PRN

## 2021-08-24 MED ORDER — METFORMIN HCL 500 MG PO TABS
1000.0000 mg | ORAL_TABLET | Freq: Two times a day (BID) | ORAL | Status: DC
  Administered 2021-08-24 – 2021-08-25 (×3): 1000 mg via ORAL
  Filled 2021-08-24 (×3): qty 2

## 2021-08-24 NOTE — Progress Notes (Signed)
Seen and examined briefly. Lying on bed.  Was prepared for discharge yesterday but could not go because of pending insurance authorization. No change in status. [facility] will not bill for today.

## 2021-08-24 NOTE — TOC Progression Note (Addendum)
Transition of Care [facility]) - Initial/Assessment Note    Patient Details  Name: Steve Alvarado MRN: [id number] Date of Birth: Jun 07, 1955  Transition [facility]) CM/SW Contact:    Ralene Bathe, LCSWA Phone Number: 08/24/2021, 10:19 AM  Clinical Narrative:                 CSW spoke with Stanton Kidney in admission at [facility] and was informed that the facility cannot accept the patient until [facility] has made a determination in reference to insurance auth.  The facility is hopeful that [facility] Medicare will approve SNF placement, but if denied the facility will accept the patient back under patient's Medicaid.   Authorization is still pending at this time.   13:30-  CSW received a call from [employer] authorization agency) requested updated clinicals.  Clinicals faxed.   TOC will continue to follow for DC needs.   Patient Goals and CMS Choice        Expected Discharge Plan and Services           Expected Discharge Date: 08/23/21                                    Prior Living Arrangements/Services                       Activities of Daily Living Home Assistive Devices/Equipment: Dentures (specify type) ADL Screening (condition at time of admission) Patient's cognitive ability adequate to safely complete daily activities?: Yes Is the patient deaf or have difficulty hearing?: No Does the patient have difficulty seeing, even when wearing glasses/contacts?: No Does the patient have difficulty concentrating, remembering, or making decisions?: No Patient able to express need for assistance with ADLs?: No Does the patient have difficulty dressing or bathing?: Yes Independently performs ADLs?: No Dressing (OT): Needs assistance Bathing: Needs assistance Toileting: Needs assistance Does the patient have difficulty walking or climbing stairs?: Yes Weakness of Legs: Left Weakness of Arms/Hands: Left  Permission Sought/Granted                  Emotional  Assessment              Admission diagnosis:  Left-sided weakness [R53.1] AKI (acute kidney injury) (HCC) [N17.9] Acute kidney injury (HCC) [N17.9] Patient Active Problem List   Diagnosis Date Noted   AKI (acute kidney injury) (HCC) 08/21/2021   Acute kidney injury (HCC) 08/20/2021   GERD without esophagitis 08/20/2021   Basal ganglia stroke (HCC) 08/12/2021   Type 2 diabetes mellitus with diabetic polyneuropathy, with long-term current use of insulin (HCC) 07/09/2021   Hypothyroidism 07/09/2021   Depression 10/21/2020   Non compliance w medication regimen 10/21/2020   Cocaine abuse (HCC) 09/05/2020   Adjustment disorder with emotional disturbance 09/05/2020   Essential hypertension 02/14/2011   Coronary artery disease involving native coronary artery of native heart without angina pectoris 08/13/2010   Stented coronary artery 08/13/2010   PCP:  Ellery Plunk, MD Pharmacy:  No Pharmacies Listed    Social Determinants of Health (SDOH) Interventions    Readmission Risk Interventions     View : No data to display.

## 2021-08-24 NOTE — Progress Notes (Signed)
Pending insurance authorization for discharge to SNF. No change in clinical status.   Briefly seen and examined this morning [facility] will not bill for today.

## 2021-08-24 NOTE — Progress Notes (Signed)
Physical Therapy Treatment Patient Details Name: Steve Alvarado MRN: [id number] DOB: 1955-09-07 Today's Date: 08/24/2021   History of Present Illness Pt is a 66 y/o male presenting for worsening weakness in L extremeties and AKI. Imaging showed Continued interval evolution of recently identified right basal  ganglia ischemic infarct. PMH includes recent CVA, CAD, HTN, DM.    PT Comments    Pt required max assist supine to sit. Pt pushing with R hand in sitting. Able to correct with verbal cues. Poor sitting balance. Unable to successfully stand or squat pivot with +2 max assist due to heavy push L with standing attempts. Pt returned to bed. Rolling R/L min assist for pericare and bed linen change. HOB at 45 degrees at end of session. Pillow place at L shoulder/flank to help maintain midline.    Recommendations for follow up therapy are one component of a multi-disciplinary discharge planning process, led by the attending physician.  Recommendations may be updated based on patient status, additional functional criteria and insurance authorization.  Follow Up Recommendations  Skilled nursing-short term rehab (<3 hours/day)     Assistance Recommended at Discharge Intermittent Supervision/Assistance  Patient can return home with the following Two people to help with walking and/or transfers;Two people to help with bathing/dressing/bathroom;Assistance with cooking/housework;Assistance with feeding;Direct supervision/assist for medications management;Direct supervision/assist for financial management;Assist for transportation;Help with stairs or ramp for entrance   Equipment Recommendations  Wheelchair (measurements PT);Wheelchair cushion (measurements PT);Other (comment) (hoyer lift)    Recommendations for Other Services       Precautions / Restrictions Precautions Precautions: Fall;Other (comment) Precaution Comments: L side weakness     Mobility  Bed Mobility Overal bed mobility:  Needs Assistance Bed Mobility: Rolling, Supine to Sit, Sit to Supine Rolling: Min assist   Supine to sit: HOB elevated, Max assist Sit to supine: Max assist, +2 for physical assistance   General bed mobility comments: +rail, heavy L/posterior lean    Transfers                   General transfer comment: unable to successfully stand or squat pivot with +2 max assist. Would benefit from trial of stedy next session.    Ambulation/Gait                   Stairs             Wheelchair Mobility    Modified Rankin (Stroke Patients Only) Modified Rankin (Stroke Patients Only) Pre-Morbid Rankin Score: Moderately severe disability Modified Rankin: Severe disability     Balance Overall balance assessment: Needs assistance Sitting-balance support: Single extremity supported, Feet supported Sitting balance-Leahy Scale: Poor Sitting balance - Comments: Pushing with RUE. Able to correct with verbal cues. Postural control: Left lateral lean                                  Cognition Arousal/Alertness: Awake/alert Behavior During Therapy: WFL for tasks assessed/performed Overall Cognitive Status: No family/caregiver present to determine baseline cognitive functioning                                 General Comments: Slow processing        Exercises      General Comments        Pertinent Vitals/Pain Pain Assessment Pain Assessment: No/denies pain    Home Living  Prior Function            PT Goals (current goals can now be found in the care plan section) Acute Rehab PT Goals Patient Stated Goal: to get stronger Progress towards PT goals: Progressing toward goals    Frequency    Min 2X/week      PT Plan Current plan remains appropriate    Co-evaluation              AM-PAC PT "6 Clicks" Mobility   Outcome Measure  Help needed turning from your back to your side  while in a flat bed without using bedrails?: A Lot Help needed moving from lying on your back to sitting on the side of a flat bed without using bedrails?: Total Help needed moving to and from a bed to a chair (including a wheelchair)?: Total Help needed standing up from a chair using your arms (e.g., wheelchair or bedside chair)?: Total Help needed to walk in hospital room?: Total Help needed climbing 3-5 steps with a railing? : Total 6 Click Score: 7    End of Session Equipment Utilized During Treatment: Gait belt Activity Tolerance: Patient tolerated treatment well Patient left: in bed;with call bell/phone within reach;with bed alarm set Nurse Communication: Mobility status PT Visit Diagnosis: Other abnormalities of gait and mobility (R26.89);Muscle weakness (generalized) (M62.81);Difficulty in walking, not elsewhere classified (R26.2) Hemiplegia - Right/Left: Left Hemiplegia - dominant/non-dominant: Non-dominant Hemiplegia - caused by: Cerebral infarction     Time: [id number] PT Time Calculation (min) (ACUTE ONLY): 26 min  Charges:  $Therapeutic Activity: 23-37 mins                     Lorrin Goodell, PT  Office # [phone] Pager [phone]    Steve Alvarado 08/24/2021, 12:50 PM

## 2021-08-25 LAB — GLUCOSE, CAPILLARY
Glucose-Capillary: 125 mg/dL — ABNORMAL HIGH (ref 70–99)
Glucose-Capillary: 122 mg/dL — ABNORMAL HIGH (ref 70–99)

## 2021-08-25 MED ORDER — CEPHALEXIN 500 MG PO CAPS
500.0000 mg | ORAL_CAPSULE | Freq: Three times a day (TID) | ORAL | Status: DC
  Administered 2021-08-25 (×2): 500 mg via ORAL
  Filled 2021-08-25 (×2): qty 1

## 2021-08-25 MED ORDER — CEPHALEXIN 500 MG PO CAPS: 500.0000 mg | ORAL_CAPSULE | Freq: Three times a day (TID) | ORAL | Status: DC

## 2021-08-25 MED ORDER — SACCHAROMYCES BOULARDII 250 MG PO CAPS: 250.0000 mg | ORAL_CAPSULE | Freq: Two times a day (BID) | ORAL | 0 refills | Status: AC

## 2021-08-25 NOTE — Progress Notes (Signed)
DISCHARGE NOTE SNF [facility] to be discharged  per MD order. Patient verbalized understandiSkilled nursing facilityng.  Skin clean, dry and intact without evidence of skin break down, no evidence of skin tears noted. IV catheter discontinued intact. Site without signs and symptoms of complications. Dressing and pressure applied. Pt denies pain at the site currently. No complaints noted.  Patient free of lines, drains, and wounds.   Discharge packet assembled. An After Visit Summary (AVS) was printed and given to the EMS personnel. Patient escorted via stretcher and discharged to [facility] via ambulance. Report called to accepting facility; all questions and concerns addressed.   Pecola Haxton S Vaughan Garfinkle, RN _______________________________________________________________________

## 2021-08-25 NOTE — Progress Notes (Signed)
Nursing report given to nurse Jamesetta Orleans

## 2021-08-25 NOTE — Discharge Summary (Signed)
Physician Discharge Summary  Steve Alvarado WVP:[id number] DOB: 10/16/1955 DOA: 08/19/2021  PCP: Ellery Plunk, MD  Admit date: 08/19/2021 Discharge date: 08/25/2021  Admitted From: SNF Discharge disposition: Back to SNF   Brief narrative: Steve Alvarado is a 66 y.o. male with PMH significant for DM2, HTN, HLD, CAD/stent 2012, hypothyroidism, history of cocaine abuse and recent hospitalization for a stroke.   Patient was recently hospitalized 5/25 to 5/21 for acute stroke with left-sided weakness, status post TNK administration and was discharged to [facility] on 5/31. He was sent back to ED within 24 hours with new concerns of left facial droop, left-sided weakness, slurring of speech and drooling of saliva.  In the ED, patient was hemodynamically stable. MRI brain revealed continued internal evolution of the recently identified right basal ganglia ischemic infarct without significant change in size, associated hemorrhage or mass effect.   Labs showed significant increase in creatinine in 24 hours duration to 2.98. Urinalysis with cloudy amber-colored urine with small amount of leukocytes, rare bacteria.  Bladder scan showed 220 cc of urine. Patient was admitted to hospitalist service for further evaluation and management.  Subjective: Patient was seen and examined this morning.  Lying down in bed.  Not in distress.  No new symptoms.  Blood pressure improving.  Creatinine much better. Okay to discharge back to facility today.  Principal Problem:   Acute kidney injury (HCC) Active Problems:   Basal ganglia stroke (HCC)   Type 2 diabetes mellitus with diabetic polyneuropathy, with long-term current use of insulin (HCC)   Coronary artery disease involving native coronary artery of native heart without angina pectoris   Essential hypertension   Cocaine abuse (HCC)   Hypothyroidism   GERD without esophagitis   AKI (acute kidney injury) (HCC)    Assessment and  Plan: Acute kidney injury  -Creatinine was normal at baseline.  Creatinine progressively worsened since 5/30 to peak at 3.4 on 6/1.  Gradually improved down to normal with IV fluid.  Losartan was initially held, later resumed.  Recent Labs    08/12/21 1157 08/14/21 0325 08/15/21 0125 08/16/21 0122 08/17/21 0023 08/18/21 0234 08/19/21 2345 08/19/21 2347 08/21/21 0210 08/22/21 0839  BUN 33* 14 19 19 23  25* 50* 48* 45* 18  CREATININE 1.70* 0.97 1.11 1.09 1.38* 1.25* 2.98* 3.40* 1.79* 1.01   E. coli UTI -Urinalysis with cloudy amber-colored urine with small amount of leukocytes, rare bacteria.  -Urine culture grew E. coli, pansensitive.  Started on 5-day course of Keflex.  Recent basal ganglia stroke  -Patient's transient neurologic symptoms may be secondary to acute kidney injury and mild uremia per neurology -MRI did not show significant change compared to prior -Continue supportive care -Continue regimen of aspirin, Plavix and Lipitor  Type 2 diabetes mellitus -A1c 7.9 on 08/13/2021 -On 5/31, patient was discharged on Lantus 10 units nightly and metformin 1000 mg twice daily. -Continue same regimen.  CAD/stents -Currently chest pain-free.  Continue to monitor in telemetry.   -Continue carvedilol, aspirin, Plavix and statin.      Essential hypertension -PTA, patient was on Coreg 3.125 mg twice daily, amlodipine 10 mg daily, losartan 50 mg daily.   -Losartan was initially held for AKI.  Later resumed.  Continue all 3 at discharge.  Blood pressure improving.   Cocaine abuse  -Patient reports remaining abstinent -Continuing to counsel cessation   Hypothyroidism -Continue Synthroid   Goals of care   Code Status: Full Code    Mobility: PT  eval  Skin assessment:     Nutritional status:  Body mass index is 25.45 kg/m.           Wounds:  -    Discharge Exam:   Vitals:   08/24/21 1649 08/24/21 2059 08/25/21 0446 08/25/21 0926  BP: 106/61 140/78 104/61  130/69  Pulse: (!) 59 61 64 63  Resp: 20 18 17 17   Temp: 98.3 F (36.8 C) 97.9 F (36.6 C) (!) 97.4 F (36.3 C) 98 F (36.7 C)  TempSrc: Oral Oral Oral   SpO2: 100% 99% 98% 100%  Weight:      Height:        Body mass index is 25.45 kg/m.  General exam: Elderly African-American male.  Not in physical distress. Skin: No rashes, lesions or ulcers. HEENT: Atraumatic, normocephalic, no obvious bleeding Lungs: Clear to auscultation bilaterally CVS: Regular rate and rhythm, no murmur GI/Abd soft, nontender, nondistended, bowel sound present CNS: Alert, awake, oriented x3.  Left-sided hemiplegia from previous stroke  psychiatry: Mood appropriate Extremities: No pedal edema, no calf tenderness  Follow ups:    Follow-up Information     [name], Kriste BasqueKathleen K, MD Follow up.   Specialty: Family Medicine Contact information: [address] [id number] [facility] [address] [phone]                 Discharge Instructions:   Discharge Instructions     Call MD for:  difficulty breathing, headache or visual disturbances   Complete by: As directed    Call MD for:  extreme fatigue   Complete by: As directed    Call MD for:  hives   Complete by: As directed    Call MD for:  persistant dizziness or light-headedness   Complete by: As directed    Call MD for:  persistant nausea and vomiting   Complete by: As directed    Call MD for:  severe uncontrolled pain   Complete by: As directed    Call MD for:  temperature >100.4   Complete by: As directed    Diet - low sodium heart healthy   Complete by: As directed    Diet Carb Modified   Complete by: As directed    Discharge instructions   Complete by: As directed    Discharge instructions for diabetes mellitus: Check blood sugar 3 times a day and bedtime at home. If blood sugar running above 200 or less than 70 please call your MD to adjust insulin. If you notice signs and symptoms of  hypoglycemia (low blood sugar) like jitteriness, confusion, thirst, tremor and sweating, please check blood sugar, drink sugary drink/biscuits/sweets to increase sugar level and call MD or return to ER.    General discharge instructions: Follow with Primary MD Ralene BatheBarnhouse, Kriste BasqueKathleen K, MD in 7 days  Please request your PCP  to go over your hospital tests, procedures, radiology results at the follow up. Please get your medicines reviewed and adjusted.  Your PCP may decide to repeat certain labs or tests as needed. Do not drive, operate heavy machinery, perform activities at heights, swimming or participation in water activities or provide baby sitting services if your were admitted for syncope or siezures until you have seen by Primary MD or a Neurologist and advised to do so again. [place] Controlled Substance Reporting System database was reviewed. Do not drive, operate heavy machinery, perform activities at heights, swim, participate in water activities or provide baby-sitting services while on  medications for pain, sleep and mood until your outpatient physician has reevaluated you and advised to do so again.  You are strongly recommended to comply with the dose, frequency and duration of prescribed medications. Activity: As tolerated with Full fall precautions use walker/cane & assistance as needed Avoid using any recreational substances like cigarette, tobacco, alcohol, or non-prescribed drug. If you experience worsening of your admission symptoms, develop shortness of breath, life threatening emergency, suicidal or homicidal thoughts you must seek medical attention immediately by calling 911 or calling your MD immediately  if symptoms less severe. You must read complete instructions/literature along with all the possible adverse reactions/side effects for all the medicines you take and that have been prescribed to you. Take any new medicine only after you have completely understood and accepted  all the possible adverse reactions/side effects.  Wear Seat belts while driving. You were cared for by a hospitalist during your hospital stay. If you have any questions about your discharge medications or the care you received while you were in the hospital after you are discharged, you can call the unit and ask to speak with the hospitalist or the covering physician. Once you are discharged, your primary care physician will handle any further medical issues. Please note that NO REFILLS for any discharge medications will be authorized once you are discharged, as it is imperative that you return to your primary care physician (or establish a relationship with a primary care physician if you do not have one).   Increase activity slowly   Complete by: As directed        Discharge Medications:   Allergies as of 08/25/2021       Reactions   Ace Inhibitors Cough   Reaction not listed on MAR        Medication List     STOP taking these medications    traMADol 50 MG tablet Commonly known as: ULTRAM       TAKE these medications    amLODipine 10 MG tablet Commonly known as: NORVASC Take 1 tablet (10 mg total) by mouth every morning.   lidocaine 4 % Place 1 patch onto the skin daily. Apply one patch topically to left abdomen every morning - leave on for 12 hours. 12 hours on and 12 hours off.   Aspercreme Lidocaine 4 % Generic drug: lidocaine 1 patch See admin instructions. Apply 1 patch topically daily in the morning   aspirin EC 81 MG tablet Take 1 tablet (81 mg total) by mouth daily. Swallow whole.   atorvastatin 40 MG tablet Commonly known as: LIPITOR Take 1 tablet (40 mg total) by mouth every evening. What changed: when to take this   carvedilol 3.125 MG tablet Commonly known as: COREG Take 1 tablet (3.125 mg total) by mouth 2 (two) times daily with a meal. What changed: when to take this   cephALEXin 500 MG capsule Commonly known as: KEFLEX Take 1 capsule (500 mg  total) by mouth every 8 (eight) hours.   citalopram 20 MG tablet Commonly known as: CELEXA Take 1 tablet (20 mg total) by mouth daily.   clopidogrel 75 MG tablet Commonly known as: PLAVIX Take 1 tablet (75 mg total) by mouth daily. What changed: when to take this   gabapentin 400 MG capsule Commonly known as: NEURONTIN Take 400 mg by mouth 3 (three) times daily. 8am, 12pm 4pm   HumaLOG 100 UNIT/ML injection Generic drug: insulin lispro Inject 0.03 mLs (3 Units total) into the skin 3 (  three) times daily with meals.   insulin glargine-yfgn 100 UNIT/ML injection Commonly known as: SEMGLEE Inject 0.1 mLs (10 Units total) into the skin at bedtime.   levothyroxine 75 MCG tablet Commonly known as: SYNTHROID Take 75 mcg by mouth at bedtime. 2200   losartan 50 MG tablet Commonly known as: COZAAR Take 50 mg by mouth daily.   metFORMIN 1000 MG tablet Commonly known as: GLUCOPHAGE Take 1,000 mg by mouth 2 (two) times daily.   oxyCODONE-acetaminophen 5-325 MG tablet Commonly known as: PERCOCET/ROXICET Take 1 tablet by mouth every 6 (six) hours as needed for up to 5 days for moderate pain.   pantoprazole 40 MG tablet Commonly known as: PROTONIX Take 40 mg by mouth every morning. What changed: Another medication with the same name was removed. Continue taking this medication, and follow the directions you see here.   polyethylene glycol 17 g packet Commonly known as: MIRALAX / GLYCOLAX Take 17 g by mouth daily as needed for mild constipation.   saccharomyces boulardii 250 MG capsule Commonly known as: FLORASTOR Take 1 capsule (250 mg total) by mouth 2 (two) times daily for 5 days.        The results of significant diagnostics from this hospitalization (including imaging, microbiology, ancillary and laboratory) are listed below for reference.    Procedures and Diagnostic Studies:   MR BRAIN WO CONTRAST  Result Date: 08/20/2021 CLINICAL DATA:  Initial evaluation for neuro  deficit, stroke suspected. EXAM: MRI HEAD WITHOUT CONTRAST TECHNIQUE: Multiplanar, multiecho pulse sequences of the brain and surrounding structures were obtained without intravenous contrast. COMPARISON:  CT from 08/19/2021 and brain MRI from 08/13/2021. FINDINGS: Brain: Cerebral volume within normal limits. Chronic microvascular ischemic disease involving the periventricular deep white matter both cerebral hemispheres as well as the pons noted, stable. Few scattered small remote bilateral cerebellar infarcts again noted. Small remote lacunar infarct present at the left thalamus. There has been continued interval evolution of recently identified ischemic infarct involving the posterior right basal ganglia, not significantly changed in size or morphology as compared to previous. No associated hemorrhage or significant regional mass effect. No evidence for significant interval expansion or new infarction. No other acute or subacute ischemic changes elsewhere within the brain. Gray-white matter differentiation otherwise maintained. No acute intracranial hemorrhage. Single punctate chronic microhemorrhage noted within the right frontal centrum semi ovale, likely small vessel related. No mass lesion, midline shift or mass effect. No hydrocephalus or extra-axial fluid collection. Pituitary gland suprasellar region within normal limits. Vascular: Major intracranial vascular flow voids are maintained. Skull and upper cervical spine: Craniocervical junction normal. Bone mineral. No scalp soft tissue abnormality. Sinuses/Orbits: Globes orbital soft tissues within normal limits. Scattered mucosal thickening noted throughout the paranasal sinuses. Small right greater than left mastoid effusions. Visualized nasopharynx within normal limits. Other: None. IMPRESSION: 1. Continued interval evolution of recently identified right basal ganglia ischemic infarct, not significantly changed in size or morphology as compared to previous.  No associated hemorrhage or significant regional mass effect. 2. No other new acute intracranial abnormality. 3. Underlying chronic microvascular ischemic disease with a few small remote left thalamic and cerebellar infarcts, stable. Electronically Signed   By: Rise Mu M.D.   On: 08/20/2021 03:15   DG Chest Port 1 View  Result Date: 08/20/2021 CLINICAL DATA:  Stroke. EXAM: PORTABLE CHEST 1 VIEW COMPARISON:  Chest x-ray 07/08/2021 FINDINGS: The heart size and mediastinal contours are within normal limits. Both lungs are clear. The visualized skeletal structures are unremarkable. IMPRESSION:  No active disease. Electronically Signed   By: Darliss Cheney M.D.   On: 08/20/2021 00:19   CT HEAD CODE STROKE WO CONTRAST  Result Date: 08/19/2021 CLINICAL DATA:  Code stroke. Initial evaluation for neuro deficit, stroke suspected. EXAM: CT HEAD WITHOUT CONTRAST TECHNIQUE: Contiguous axial images were obtained from the base of the skull through the vertex without intravenous contrast. RADIATION DOSE REDUCTION: This exam was performed according to the departmental dose-optimization program which includes automated exposure control, adjustment of the mA and/or kV according to patient size and/or use of iterative reconstruction technique. COMPARISON:  Prior MRI from 08/13/2021. FINDINGS: Brain: Normal expected interval evolution of recently identified infarct involving the posterior right basal ganglia, relatively stable in size and distribution as compared to prior MRI. No significant regional mass effect or evidence for hemorrhagic transformation. No other new large vessel territory infarct. No other acute intracranial hemorrhage. Underlying atrophy with chronic microvascular ischemic disease with a few small remote cerebellar infarcts noted. No mass lesion or midline shift. No hydrocephalus or extra-axial fluid collection. Vascular: No hyperdense vessel. Calcified atherosclerosis present at skull base. Skull:  Scalp soft tissues and calvarium demonstrate no acute finding. Sinuses/Orbits: Globes orbital soft tissues within normal limits. Mild mucosal thickening present about the maxillary sinuses. Paranasal sinuses are otherwise largely clear. No significant mastoid effusion. Other: None. ASPECTS [facility] Stroke Program Early CT Score) - Ganglionic level infarction (caudate, lentiform nuclei, internal capsule, insula, M1-M3 cortex): 7 - Supraganglionic infarction (M4-M6 cortex): 3 Total score (0-10 with 10 being normal): 10 IMPRESSION: 1. Normal expected interval evolution of previously identified subacute infarct involving the right basal ganglia. No evidence for hemorrhagic transformation or significant mass effect. 2. No other new acute intracranial abnormality. 3. ASPECTS is 10. 4. Underlying atrophy with chronic small vessel ischemic disease with a few small remote cerebellar infarcts. These results were communicated to Dr. Iver Nestle at 11:53 pm on 08/19/2021 by text page via the [facility] messaging system. Electronically Signed   By: Rise Mu M.D.   On: 08/19/2021 23:56     Labs:   Basic Metabolic Panel: Recent Labs  Lab 08/19/21 2345 08/19/21 2347 08/21/21 0210 08/22/21 0839  NA 139 139 138 137  K 4.4 4.4 4.4 4.7  CL 109 108 111 109  CO2 20*  --  21* 22  GLUCOSE 108* 105* 98 164*  BUN 50* 48* 45* 18  CREATININE 2.98* 3.40* 1.79* 1.01  CALCIUM 9.1  --  8.8* 8.9  MG  --   --  1.7  --    GFR Estimated Creatinine Clearance: 82.5 mL/min (by C-G formula based on SCr of 1.01 mg/dL). Liver Function Tests: Recent Labs  Lab 08/19/21 2345 08/21/21 0210  AST 19 15  ALT 18 17  ALKPHOS 67 68  BILITOT 0.5 0.6  PROT 6.7 6.1*  ALBUMIN 3.5 3.1*   No results for input(s): LIPASE, AMYLASE in the last 168 hours. No results for input(s): AMMONIA in the last 168 hours. Coagulation profile Recent Labs  Lab 08/19/21 2345  INR 1.0    CBC: Recent Labs  Lab 08/19/21 2345 08/19/21 2347  08/21/21 0210  WBC 6.5  --  6.2  NEUTROABS 3.5  --  3.6  HGB 10.9* 10.5* 9.9*  HCT 33.8* 31.0* 30.2*  MCV 91.8  --  89.6  PLT 214  --  185   Cardiac Enzymes: No results for input(s): CKTOTAL, CKMB, CKMBINDEX, TROPONINI in the last 168 hours. BNP: Invalid input(s): POCBNP CBG: Recent Labs  Lab  08/24/21 0719 08/24/21 1134 08/24/21 1648 08/24/21 2059 08/25/21 0714  GLUCAP 173* 93 172* 118* 125*   D-Dimer No results for input(s): DDIMER in the last 72 hours. Hgb A1c No results for input(s): HGBA1C in the last 72 hours. Lipid Profile No results for input(s): CHOL, HDL, LDLCALC, TRIG, CHOLHDL, LDLDIRECT in the last 72 hours. Thyroid function studies No results for input(s): TSH, T4TOTAL, T3FREE, THYROIDAB in the last 72 hours.  Invalid input(s): FREET3 Anemia work up No results for input(s): VITAMINB12, FOLATE, FERRITIN, TIBC, IRON, RETICCTPCT in the last 72 hours. Microbiology Recent Results (from the past 240 hour(s))  Urine Culture     Status: Abnormal   Collection Time: 08/20/21 12:04 AM   Specimen: Urine, Clean Catch  Result Value Ref Range Status   Specimen Description URINE, CLEAN CATCH  Final   Special Requests   Final    NONE Performed at [facility], [address] [place], [place] [address]    Culture >=100,000 COLONIES/mL PROTEUS MIRABILIS (A)  Final   Report Status 08/21/2021 FINAL  Final   Organism ID, Bacteria PROTEUS MIRABILIS (A)  Final      Susceptibility   Proteus mirabilis - MIC*    AMPICILLIN <=2 SENSITIVE Sensitive     CEFAZOLIN 8 SENSITIVE Sensitive     CEFEPIME <=0.12 SENSITIVE Sensitive     CEFTRIAXONE <=0.25 SENSITIVE Sensitive     CIPROFLOXACIN <=0.25 SENSITIVE Sensitive     GENTAMICIN <=1 SENSITIVE Sensitive     IMIPENEM 2 SENSITIVE Sensitive     NITROFURANTOIN 128 RESISTANT Resistant     TRIMETH/SULFA <=20 SENSITIVE Sensitive     AMPICILLIN/SULBACTAM <=2 SENSITIVE Sensitive     PIP/TAZO <=4 SENSITIVE Sensitive     *  >=100,000 COLONIES/mL PROTEUS MIRABILIS    Time coordinating discharge: 35 minutes  Signed: Meila Berke  [facility] 08/25/2021, 11:16 AM

## 2021-08-25 NOTE — Care Management Important Message (Signed)
Important Message  Patient Details  Name: Steve Alvarado MRN: [id number] Date of Birth: 1955-04-24   Medicare Important Message Given:  Yes     [name] 08/25/2021, 3:30 PM

## 2021-08-25 NOTE — TOC Transition Note (Signed)
Transition of Care [facility]) - CM/SW Discharge Note   Patient Details  Name: Steve Alvarado MRN: [id number] Date of Birth: 13-Sep-1955  [facility] of [facility]) CM/SW Contact:  Milinda Antis, LCSWA Phone Number: 08/25/2021, 12:12 PM   Clinical Narrative:    Patient will DC to:  [place] [facility] Anticipated DC date: 08/25/2021 Family notified: Yes Transport by: Corey Harold   Per MD patient ready for DC to . RN to call report prior to discharge [phone] room 329A. RN, patient, patient's family, and facility notified of DC. Discharge Summary and FL2 sent to facility. DC packet on chart. Ambulance transport will be requested for patient.   CSW will sign off for now as social work intervention is no longer needed. Please consult [place] again if new needs arise.     Final next level of care: [place]     Patient Goals and CMS Choice        Discharge Placement   Existing PASRR number confirmed : 08/25/21          Patient chooses bed at:  [facility]) Patient to be transferred to facility by: [facility] Name of family member notified: spouse Patient and family notified of of transfer: 08/25/21  Discharge Plan and Services                                     Social Determinants of Health (SDOH) Interventions     Readmission Risk Interventions     View : No data to display.

## 2021-08-25 NOTE — TOC Progression Note (Addendum)
Transition of Care [facility]) - Initial/Assessment Note    Patient Details  Name: Steve Alvarado MRN: [id number] Date of Birth: 03/04/1956  Transition of Care [facility]) CM/SW Contact:    Milinda Antis, LCSWA Phone Number: 08/25/2021, 9:13 AM  Clinical Narrative:                 09:12-  Insurance auth still pending  TOC will continue to follow.  11:10-  Insurance Josem Kaufmann will deny. Planning for d/c today.        Patient Goals and CMS Choice        Expected Discharge Plan and Services           Expected Discharge Date: 08/23/21                                    Prior Living Arrangements/Services                       Activities of Daily Living Home Assistive Devices/Equipment: Dentures (specify type) ADL Screening (condition at time of admission) Patient's cognitive ability adequate to safely complete daily activities?: Yes Is the patient deaf or have difficulty hearing?: No Does the patient have difficulty seeing, even when wearing glasses/contacts?: No Does the patient have difficulty concentrating, remembering, or making decisions?: No Patient able to express need for assistance with ADLs?: No Does the patient have difficulty dressing or bathing?: Yes Independently performs ADLs?: No Dressing (OT): Needs assistance Bathing: Needs assistance Toileting: Needs assistance Does the patient have difficulty walking or climbing stairs?: Yes Weakness of Legs: Left Weakness of Arms/Hands: Left  Permission Sought/Granted                  Emotional Assessment              Admission diagnosis:  Left-sided weakness [R53.1] AKI (acute kidney injury) ([place]) [N17.9] Acute kidney injury ([place]) [N17.9] Patient Active Problem List   Diagnosis Date Noted   AKI (acute kidney injury) ([place]) 08/21/2021   Acute kidney injury ([place]) 08/20/2021   GERD without esophagitis 08/20/2021   Basal ganglia stroke ([place]) 08/12/2021   Type 2 diabetes mellitus with  diabetic polyneuropathy, with long-term current use of insulin ([place]) 07/09/2021   Hypothyroidism 07/09/2021   Depression 10/21/2020   Non compliance w medication regimen 10/21/2020   Cocaine abuse ([place]) 09/05/2020   Adjustment disorder with emotional disturbance 09/05/2020   Essential hypertension 02/14/2011   Coronary artery disease involving native coronary artery of native heart without angina pectoris 08/13/2010   Stented coronary artery 08/13/2010   PCP:  Kingsley Callander, MD Pharmacy:  No Pharmacies Listed    Social Determinants of Health (SDOH) Interventions    Readmission Risk Interventions     View : No data to display.

## 2021-09-17 ENCOUNTER — Other Ambulatory Visit: Payer: Self-pay

## 2021-09-17 ENCOUNTER — Emergency Department: Payer: Medicare Other

## 2021-09-17 ENCOUNTER — Emergency Department
Admission: EM | Admit: 2021-09-17 | Discharge: 2021-09-17 | Disposition: A | Discharge status: Skilled Nursing Facility | Payer: Medicare Other | Attending: Emergency Medicine | Admitting: Emergency Medicine

## 2021-09-17 DIAGNOSIS — S161XXA Strain of muscle, fascia and tendon at neck level, initial encounter: Secondary | ICD-10-CM | POA: Diagnosis not present

## 2021-09-17 DIAGNOSIS — M545 Low back pain, unspecified: Secondary | ICD-10-CM | POA: Insufficient documentation

## 2021-09-17 DIAGNOSIS — S199XXA Unspecified injury of neck, initial encounter: Secondary | ICD-10-CM | POA: Diagnosis present

## 2021-09-17 DIAGNOSIS — W08XXXA Fall from other furniture, initial encounter: Secondary | ICD-10-CM

## 2021-09-17 DIAGNOSIS — M791 Myalgia, unspecified site: Secondary | ICD-10-CM | POA: Diagnosis not present

## 2021-09-17 DIAGNOSIS — Y93F2 Activity, caregiving, lifting: Secondary | ICD-10-CM | POA: Insufficient documentation

## 2021-09-17 DIAGNOSIS — M7918 Myalgia, other site: Secondary | ICD-10-CM

## 2021-09-17 DIAGNOSIS — R519 Headache, unspecified: Secondary | ICD-10-CM | POA: Diagnosis present

## 2021-09-17 DIAGNOSIS — Y92199 Unspecified place in other specified residential institution as the place of occurrence of the external cause: Secondary | ICD-10-CM | POA: Insufficient documentation

## 2021-09-17 DIAGNOSIS — W19XXXA Unspecified fall, initial encounter: Secondary | ICD-10-CM

## 2021-09-17 DIAGNOSIS — M542 Cervicalgia: Secondary | ICD-10-CM | POA: Diagnosis present

## 2021-09-17 MED ORDER — FUTURO SOFT CERVICAL COLLAR MISC: 1.0000 | Freq: Once | 0 refills | Status: AC

## 2021-09-17 MED ORDER — HYDROCODONE-ACETAMINOPHEN 5-325 MG PO TABS: 1.0000 | ORAL_TABLET | Freq: Three times a day (TID) | ORAL | 0 refills | Status: AC | PRN

## 2021-09-17 MED ORDER — OXYCODONE-ACETAMINOPHEN 5-325 MG PO TABS
1.0000 | ORAL_TABLET | ORAL | Status: DC | PRN
  Administered 2021-09-17: 1 via ORAL
  Filled 2021-09-17: qty 1

## 2021-09-17 MED ORDER — HYDROCODONE-ACETAMINOPHEN 5-325 MG PO TABS
1.0000 | ORAL_TABLET | Freq: Once | ORAL | Status: AC
  Administered 2021-09-17: 1 via ORAL
  Filled 2021-09-17: qty 1

## 2021-09-17 NOTE — ED Notes (Signed)
Checked main stock room and Flex stock for soft collar as pt requested one from provider JM who stated she would add the order for pt since it brings mild relief of his neck discomfort. Unable to find any currently in stock so checked with secretary who is looking into supply chain to see if we can obtain one for pt.

## 2021-09-17 NOTE — ED Provider Notes (Signed)
[facility] Emergency Department Provider Note     Event Date/Time   First MD Initiated Contact with Patient 09/17/21 2044     (approximate)   History   Neck Injury and Back Pain   HPI  Steve Alvarado is a 66 y.o. male with a history of a recent CVA, presents from his rehab facility following an accident that occurred yesterday.  Patient was apparently being transferred using the [facility] lift, when the staff failed to secure him, causing him to fall back into his wheelchair.  Patient subsequently tipped backwards in his wheelchair hitting the back of his head and neck on the floor.  He has deficits to the left arm and leg secondary to the incident.  He presents today in a c-collar via EMS.  Patient presents from [facility] facility.  There is no reported LOC, nausea, vomiting, or dizziness.  Patient presents today with ongoing complaints of neck and back pain.     Physical Exam   Triage Vital Signs: ED Triage Vitals  Enc Vitals Group     BP 09/17/21 1803 131/71     Pulse Rate 09/17/21 1803 62     Resp 09/17/21 1803 18     Temp 09/17/21 1803 98.1 F (36.7 C)     Temp Source 09/17/21 1803 Oral     SpO2 09/17/21 1803 100 %     Weight --      Height --      Head Circumference --      Peak Flow --      Pain Score 09/17/21 1804 9     Pain Loc --      Pain Edu? --      Excl. in GC? --     Most recent vital signs: Vitals:   09/17/21 1803 09/17/21 2155  BP: 131/71 (!) 156/82  Pulse: 62 60  Resp: 18 18  Temp: 98.1 F (36.7 C)   SpO2: 100% 100%    General Awake, no distress.  Patient is alert talkative, and engaged HEENT NCAT. PERRL. EOMI. No rhinorrhea. Mucous membranes are moist.  CV:  Good peripheral perfusion.  RESP:  Normal effort.  ABD:  No distention.  MSK:  LUE paresis and LLE weakness at baseline.  Neck is supple with normal range of motion.   ED Results / Procedures / Treatments   Labs (all labs ordered are listed, but  only abnormal results are displayed) Labs Reviewed - No data to display   EKG   RADIOLOGY  I personally viewed and evaluated these images as part of my medical decision making, as well as reviewing the written report by the radiologist.  ED Provider Interpretation: no acute findings}  CT Head w/o CM  IMPRESSION: No acute intracranial findings are seen in noncontrast CT brain. Old infarct is seen in the right MCA distribution. Atrophy.   Chronic sinusitis.  CT Cervical Spine w/o CM  IMPRESSION: No recent fracture is seen in the cervical spine. Cervical spondylosis.  PROCEDURES:  Critical Care performed: No  Procedures   MEDICATIONS ORDERED IN ED: Medications  HYDROcodone-acetaminophen (NORCO/VICODIN) 5-325 MG per tablet 1 tablet (1 tablet Oral Given 09/17/21 2152)     IMPRESSION / MDM / ASSESSMENT AND PLAN / ED COURSE  I reviewed the triage vital signs and the nursing notes.  Differential diagnosis includes, but is not limited to, subdural hemorrhage, cervical spine fracture, cervical strain, T-spine fracture  Patient's presentation is most consistent with acute complicated illness / injury requiring diagnostic workup.  Patient's diagnosis is consistent with mechanical injury after being dropped from his [place] lift by facility staff.  Patient's overall work-up is reassuring and benign at this time.  No radiologic evidence of any acute intracranial process or acute cervical spine injury, based on my review of CT images. Patient will be discharged home with prescriptions for hydrocodone Doan and a soft cervical collar. Patient is to follow up with primary provider as needed or otherwise directed. Patient is given ED precautions to return to the ED for any worsening or new symptoms.     FINAL CLINICAL IMPRESSION(S) / ED DIAGNOSES   Final diagnoses:  Fall involving lift assistance chair as cause of accidental injury  Fall as cause of  accidental injury in residential institution as place of occurrence, initial encounter  Musculoskeletal pain  Cervical strain, acute, initial encounter     Rx / DC Orders   ED Discharge Orders          Ordered    HYDROcodone-acetaminophen (NORCO) 5-325 MG tablet  3 times daily PRN        09/17/21 2148    Elastic Bandages & Supports (FUTURO SOFT CERVICAL COLLAR) MISC   Once        09/17/21 2216             Note:  This document was prepared using Dragon voice recognition software and may include unintentional dictation errors.    Lissa Hoard, PA-C 09/20/21 2041    Delton [place], MD 09/23/21 [phone]

## 2021-09-17 NOTE — ED Triage Notes (Addendum)
Pt to ED via [facility] from [facility]. Pt states staff was moving pt from wheelchair to bed when they dropped him on the floor. PT reports HA, neck pain and back pain. Pt reports hx of stroke with left sided deficit in arm and leg. Pt placed in c-collar

## 2021-09-17 NOTE — ED Triage Notes (Signed)
First nurse note: Patient arrived by EMS from [facility]. Staff reports he was being moved with lift yesterday and dropped him in floor. Recently had stroke and at [facility] with left sided deficits. Today started c/o neck and back pain. Hx diabetes. EMS vitals Blood sugar 179, 151/75 blood pressure, 60HR, 96% RA

## 2021-09-17 NOTE — ED Provider Triage Note (Signed)
Emergency Medicine Provider Triage Evaluation Note  Steve Alvarado , a 66 y.o. male  was evaluated in triage.  Pt complains of headache, neck pain and upper back pain after being dropped onto the floor from the lift while being transferred from wheelchair. No loss of consciousness.  Physical Exam  BP 131/71 (BP Location: Right Arm)   Pulse 62   Temp 98.1 F (36.7 C) (Oral)   Resp 18   SpO2 100%  Gen:   Awake, no distress   Resp:  Normal effort  MSK:   Moves extremities without difficulty  Other:    Medical Decision Making  Medically screening exam initiated at 6:08 PM.  Appropriate orders placed.  Steve Alvarado was informed that the remainder of the evaluation will be completed by another provider, this initial triage assessment does not replace that evaluation, and the importance of remaining in the ED until their evaluation is complete.    Chinita Pester, FNP 09/17/21 1811

## 2021-09-17 NOTE — ED Notes (Signed)
Attempted report to [facility] in [place] x2. Left HIPAA-compliant voicemail for staff to call back for report. Pt educated by provider JM and sent with d/c paperwork and prescriptions. Assisted pt into ride's vehicle.

## 2021-09-17 NOTE — ED Notes (Signed)
Pt given prescription for soft collar since none currently available in ER.

## 2021-09-17 NOTE — ED Notes (Signed)
Pt assisted from wheelchair to recliner as requested as pt stated back was hurting. Pt's L leg chronically weak from past stroke. C-collar remains in place.

## 2021-09-17 NOTE — Discharge Instructions (Addendum)
Your exam and CT scans are normal and reassuring following your accident.  Take your home medicines as prescribed, the pain medicine as needed.

## 2021-09-27 ENCOUNTER — Other Ambulatory Visit: Payer: Self-pay

## 2021-09-27 ENCOUNTER — Emergency Department: Payer: Medicare Other

## 2021-09-27 ENCOUNTER — Emergency Department
Admission: EM | Admit: 2021-09-27 | Discharge: 2021-09-28 | Disposition: A | Discharge status: Home / Self Care | Payer: Medicare Other | Attending: Student in an Organized Health Care Education/Training Program | Admitting: Student in an Organized Health Care Education/Training Program

## 2021-09-27 DIAGNOSIS — M25552 Pain in left hip: Secondary | ICD-10-CM | POA: Insufficient documentation

## 2021-09-27 DIAGNOSIS — I10 Essential (primary) hypertension: Secondary | ICD-10-CM | POA: Insufficient documentation

## 2021-09-27 DIAGNOSIS — W1789XA Other fall from one level to another, initial encounter: Secondary | ICD-10-CM | POA: Diagnosis not present

## 2021-09-27 DIAGNOSIS — M545 Low back pain, unspecified: Secondary | ICD-10-CM | POA: Insufficient documentation

## 2021-09-27 DIAGNOSIS — I2510 Atherosclerotic heart disease of native coronary artery without angina pectoris: Secondary | ICD-10-CM | POA: Insufficient documentation

## 2021-09-27 DIAGNOSIS — E119 Type 2 diabetes mellitus without complications: Secondary | ICD-10-CM | POA: Insufficient documentation

## 2021-09-27 DIAGNOSIS — R1032 Left lower quadrant pain: Secondary | ICD-10-CM | POA: Diagnosis not present

## 2021-09-27 NOTE — Discharge Instructions (Addendum)
You can continue taking Percocet for pain.

## 2021-09-27 NOTE — ED Provider Triage Note (Signed)
Emergency Medicine Provider Triage Evaluation Note  Steve Alvarado , a 66 y.o. male  was evaluated in triage.  Pt complains of hip pain. Patient was being transferred from wheelchair to bed and was dropped onto his left hip a couple weeks ago. Left shoulder pain for a few weeks also from the fall  Review of Systems  Positive: Left hip, left shoulder pain Negative: Paresthesias, weakness  Physical Exam  There were no vitals taken for this visit. Gen:   Awake, no distress   Resp:  Normal effort  MSK:   Moves extremities without difficulty  Other:    Medical Decision Making  Medically screening exam initiated at 4:01 PM.  Appropriate orders placed.  Steve Alvarado was informed that the remainder of the evaluation will be completed by another provider, this initial triage assessment does not replace that evaluation, and the importance of remaining in the ED until their evaluation is complete.     Jackelyn Hoehn, PA-C 09/27/21 1604

## 2021-09-27 NOTE — ED Triage Notes (Signed)
Pt comes into the ED via EMS from [facility], pt was dropped from [place] lift about a week ago and in the past several days having left hip pain when getting up to do PT. Pt is a/ox4 on arrival  190/102 HR74 98%RA 98.5 temp [id number]

## 2021-09-27 NOTE — ED Notes (Signed)
Attempted to call report with discharge instructions to [facility] at this time. No answer.

## 2021-09-27 NOTE — ED Provider Notes (Signed)
[facility] Provider Note  Patient Contact: 7:55 PM (approximate)   History   Hip Pain   HPI  Steve Alvarado is a 66 y.o. male with history of CVA in May 2023, hypertension, coronary artery disease, diabetes and hyperlipidemia, presents to the emergency department with concern for pain with weightbearing.  Patient states that he only experiences discomfort when he attempts to stand during physical therapy.  He states that he has some pain in his low back and along the left groin.  He states that he experienced a fall on 630 after patient was accidentally dropped from a [place] lift.  Patient states that he is normally sedentary and uses a wheelchair.      Physical Exam   Triage Vital Signs: ED Triage Vitals [09/27/21 1602]  Enc Vitals Group     BP 134/72     Pulse Rate 64     Resp 18     Temp 98.1 F (36.7 C)     Temp Source Oral     SpO2 100 %     Weight      Height      Head Circumference      Peak Flow      Pain Score      Pain Loc      Pain Edu?      Excl. in GC?     Most recent vital signs: Vitals:   09/27/21 1602  BP: 134/72  Pulse: 64  Resp: 18  Temp: 98.1 F (36.7 C)  SpO2: 100%     General: Alert and in no acute distress. Eyes:  PERRL. EOMI. Head: No acute traumatic findings ENT:      Nose: No congestion/rhinnorhea.      Mouth/Throat: Mucous membranes are moist. Neck: No stridor. No cervical spine tenderness to palpation. Cardiovascular:  Good peripheral perfusion Respiratory: Normal respiratory effort without tachypnea or retractions. Lungs CTAB. Good air entry to the bases with no decreased or absent breath sounds. Gastrointestinal: Bowel sounds 4 quadrants. Soft and nontender to palpation. No guarding or rigidity. No palpable masses. No distention. No CVA tenderness. Musculoskeletal: Negative straight leg raise, left.  No pain with internal and external rotation of the left hip.  Palpable dorsalis pedis pulse,  left. Neurologic:  No gross focal neurologic deficits are appreciated.  Skin:   No rash noted Other:   ED Results / Procedures / Treatments   Labs (all labs ordered are listed, but only abnormal results are displayed) Labs Reviewed - No data to display      RADIOLOGY  I personally viewed and evaluated these images as part of my medical decision making, as well as reviewing the written report by the radiologist.  ED Provider Interpretation: Patient has subcu edema of the left hip and upper thigh but no acute fracture at the left hip.  No bony abnormality was visualized on x-ray of the left shoulder.   PROCEDURES:  Critical Care performed: No  Procedures   MEDICATIONS ORDERED IN ED: Medications - No data to display   IMPRESSION / MDM / ASSESSMENT AND PLAN / ED COURSE  I reviewed the triage vital signs and the nursing notes.                              Assessment and plan Fall 66 year old male presents to the emergency department with left-sided hip pain after a fall.  Vital signs are  reassuring at triage.  On exam, patient was alert, active and nontoxic-appearing.  He had no perceived pain with internal and external rotation of the left hip and had a negative straight leg raise test.  CT of the left hip showed some subcutaneous edema of the left thigh but no acute bony abnormality.  Will obtain CT of the lumbar spine and will reassess.   CT of the lumbar spine shows some surrounding sacral edema but no acute fracture of the lumbar spine.  We will recommend that patient continue taking Percocet for pain as prescribed chronically and follow-up with primary care as needed.   FINAL CLINICAL IMPRESSION(S) / ED DIAGNOSES   Final diagnoses:  Left hip pain     Rx / DC Orders   ED Discharge Orders     None        Note:  This document was prepared using Dragon voice recognition software and may include unintentional dictation errors.   Pia Mau [place],  Cordelia Poche 09/27/21 2102    Willy Eddy, MD 09/27/21 2239

## 2021-11-07 ENCOUNTER — Other Ambulatory Visit: Payer: Self-pay

## 2021-11-07 ENCOUNTER — Emergency Department: Payer: Medicare Other

## 2021-11-07 ENCOUNTER — Encounter: Payer: Self-pay | Admitting: Emergency Medicine

## 2021-11-07 ENCOUNTER — Inpatient Hospital Stay
Admission: EM | Admit: 2021-11-07 | Discharge: 2021-11-09 | DRG: 280 | Disposition: A | Discharge status: Skilled Nursing Facility | Payer: Medicare Other | Source: Skilled Nursing Facility | Attending: Obstetrics and Gynecology | Admitting: Obstetrics and Gynecology

## 2021-11-07 DIAGNOSIS — D631 Anemia in chronic kidney disease: Secondary | ICD-10-CM | POA: Diagnosis present

## 2021-11-07 DIAGNOSIS — I130 Hypertensive heart and chronic kidney disease with heart failure and stage 1 through stage 4 chronic kidney disease, or unspecified chronic kidney disease: Secondary | ICD-10-CM | POA: Diagnosis present

## 2021-11-07 DIAGNOSIS — D62 Acute posthemorrhagic anemia: Secondary | ICD-10-CM | POA: Diagnosis present

## 2021-11-07 DIAGNOSIS — I21A1 Myocardial infarction type 2: Secondary | ICD-10-CM | POA: Diagnosis present

## 2021-11-07 DIAGNOSIS — R319 Hematuria, unspecified: Secondary | ICD-10-CM | POA: Diagnosis present

## 2021-11-07 DIAGNOSIS — I5033 Acute on chronic diastolic (congestive) heart failure: Secondary | ICD-10-CM | POA: Diagnosis present

## 2021-11-07 DIAGNOSIS — Z7902 Long term (current) use of antithrombotics/antiplatelets: Secondary | ICD-10-CM | POA: Diagnosis not present

## 2021-11-07 DIAGNOSIS — E1142 Type 2 diabetes mellitus with diabetic polyneuropathy: Secondary | ICD-10-CM | POA: Diagnosis present

## 2021-11-07 DIAGNOSIS — F1410 Cocaine abuse, uncomplicated: Secondary | ICD-10-CM | POA: Diagnosis present

## 2021-11-07 DIAGNOSIS — I248 Other forms of acute ischemic heart disease: Secondary | ICD-10-CM

## 2021-11-07 DIAGNOSIS — R0789 Other chest pain: Secondary | ICD-10-CM | POA: Diagnosis present

## 2021-11-07 DIAGNOSIS — E1122 Type 2 diabetes mellitus with diabetic chronic kidney disease: Secondary | ICD-10-CM | POA: Diagnosis present

## 2021-11-07 DIAGNOSIS — E1165 Type 2 diabetes mellitus with hyperglycemia: Secondary | ICD-10-CM | POA: Diagnosis present

## 2021-11-07 DIAGNOSIS — I10 Essential (primary) hypertension: Secondary | ICD-10-CM | POA: Diagnosis present

## 2021-11-07 DIAGNOSIS — Z794 Long term (current) use of insulin: Secondary | ICD-10-CM

## 2021-11-07 DIAGNOSIS — I214 Non-ST elevation (NSTEMI) myocardial infarction: Secondary | ICD-10-CM | POA: Diagnosis not present

## 2021-11-07 DIAGNOSIS — N390 Urinary tract infection, site not specified: Secondary | ICD-10-CM | POA: Diagnosis present

## 2021-11-07 DIAGNOSIS — G8929 Other chronic pain: Secondary | ICD-10-CM | POA: Diagnosis present

## 2021-11-07 DIAGNOSIS — Z635 Disruption of family by separation and divorce: Secondary | ICD-10-CM

## 2021-11-07 DIAGNOSIS — I2510 Atherosclerotic heart disease of native coronary artery without angina pectoris: Secondary | ICD-10-CM | POA: Diagnosis present

## 2021-11-07 DIAGNOSIS — Z7982 Long term (current) use of aspirin: Secondary | ICD-10-CM | POA: Diagnosis not present

## 2021-11-07 DIAGNOSIS — E876 Hypokalemia: Secondary | ICD-10-CM | POA: Diagnosis present

## 2021-11-07 DIAGNOSIS — I69354 Hemiplegia and hemiparesis following cerebral infarction affecting left non-dominant side: Secondary | ICD-10-CM

## 2021-11-07 DIAGNOSIS — J9601 Acute respiratory failure with hypoxia: Secondary | ICD-10-CM | POA: Diagnosis present

## 2021-11-07 DIAGNOSIS — E785 Hyperlipidemia, unspecified: Secondary | ICD-10-CM | POA: Diagnosis present

## 2021-11-07 DIAGNOSIS — N182 Chronic kidney disease, stage 2 (mild): Secondary | ICD-10-CM | POA: Diagnosis present

## 2021-11-07 DIAGNOSIS — Z8249 Family history of ischemic heart disease and other diseases of the circulatory system: Secondary | ICD-10-CM

## 2021-11-07 DIAGNOSIS — Z7984 Long term (current) use of oral hypoglycemic drugs: Secondary | ICD-10-CM

## 2021-11-07 DIAGNOSIS — I440 Atrioventricular block, first degree: Secondary | ICD-10-CM | POA: Diagnosis present

## 2021-11-07 DIAGNOSIS — Z79899 Other long term (current) drug therapy: Secondary | ICD-10-CM | POA: Diagnosis not present

## 2021-11-07 DIAGNOSIS — Z955 Presence of coronary angioplasty implant and graft: Secondary | ICD-10-CM

## 2021-11-07 DIAGNOSIS — Z79890 Hormone replacement therapy (postmenopausal): Secondary | ICD-10-CM

## 2021-11-07 DIAGNOSIS — E039 Hypothyroidism, unspecified: Secondary | ICD-10-CM | POA: Diagnosis present

## 2021-11-07 DIAGNOSIS — R079 Chest pain, unspecified: Secondary | ICD-10-CM | POA: Diagnosis not present

## 2021-11-07 HISTORY — DX: Other cerebral infarction due to occlusion or stenosis of small artery: I63.81

## 2021-11-07 HISTORY — DX: Cocaine use, unspecified, uncomplicated: F14.90

## 2021-11-07 HISTORY — DX: Other ill-defined heart diseases: I51.89

## 2021-11-07 HISTORY — DX: Chronic kidney disease, stage 2 (mild): N18.2

## 2021-11-07 HISTORY — DX: Hypothyroidism, unspecified: E03.9

## 2021-11-07 LAB — PROTIME-INR
INR: 1 (ref 0.8–1.2)
Prothrombin Time: 13.4 seconds (ref 11.4–15.2)

## 2021-11-07 LAB — BASIC METABOLIC PANEL
Anion gap: 8 (ref 5–15)
BUN: 24 mg/dL — ABNORMAL HIGH (ref 8–23)
CO2: 26 mmol/L (ref 22–32)
Calcium: 8.9 mg/dL (ref 8.9–10.3)
Chloride: 104 mmol/L (ref 98–111)
Creatinine, Ser: 1.14 mg/dL (ref 0.61–1.24)
GFR, Estimated: >60 mL/min (ref >60)
Glucose, Bld: 139 mg/dL — ABNORMAL HIGH (ref 70–99)
Potassium: 3.4 mmol/L — ABNORMAL LOW (ref 3.5–5.1)
Sodium: 138 mmol/L (ref 135–145)

## 2021-11-07 LAB — APTT: aPTT: 33 seconds (ref 24–36)

## 2021-11-07 LAB — CBC
HCT: 26.8 % — ABNORMAL LOW (ref 39.0–52.0)
Hemoglobin: 8.8 g/dL — ABNORMAL LOW (ref 13.0–17.0)
MCH: 28.3 pg (ref 26.0–34.0)
MCHC: 32.8 g/dL (ref 30.0–36.0)
MCV: 86.2 fL (ref 80.0–100.0)
Platelets: 232 10*3/uL (ref 150–400)
RBC: 3.11 MIL/uL — ABNORMAL LOW (ref 4.22–5.81)
RDW: 13.1 % (ref 11.5–15.5)
WBC: 5.9 10*3/uL (ref 4.0–10.5)
nRBC: 0 % (ref 0.0–0.2)

## 2021-11-07 LAB — TROPONIN I (HIGH SENSITIVITY)
Troponin I (High Sensitivity): 190 ng/L (ref <18)
Troponin I (High Sensitivity): 243 ng/L (ref <18)

## 2021-11-07 LAB — BRAIN NATRIURETIC PEPTIDE: B Natriuretic Peptide: 464.5 pg/mL — ABNORMAL HIGH (ref 0.0–100.0)

## 2021-11-07 LAB — CBG MONITORING, ED
Glucose-Capillary: 100 mg/dL — ABNORMAL HIGH (ref 70–99)
Glucose-Capillary: 100 mg/dL — ABNORMAL HIGH (ref 70–99)

## 2021-11-07 MED ORDER — LOSARTAN POTASSIUM 50 MG PO TABS
50.0000 mg | ORAL_TABLET | Freq: Every day | ORAL | Status: DC
  Administered 2021-11-08 – 2021-11-09 (×2): 50 mg via ORAL
  Filled 2021-11-07 (×2): qty 1

## 2021-11-07 MED ORDER — ONDANSETRON HCL 4 MG/2ML IJ SOLN: 4.0000 mg | Freq: Four times a day (QID) | INTRAMUSCULAR | Status: DC | PRN

## 2021-11-07 MED ORDER — FUROSEMIDE 10 MG/ML IJ SOLN
40.0000 mg | Freq: Two times a day (BID) | INTRAMUSCULAR | Status: DC
  Administered 2021-11-08 – 2021-11-09 (×3): 40 mg via INTRAVENOUS
  Filled 2021-11-07 (×3): qty 4

## 2021-11-07 MED ORDER — ASPIRIN 81 MG PO TBEC
81.0000 mg | DELAYED_RELEASE_TABLET | Freq: Every day | ORAL | Status: DC
  Administered 2021-11-08 – 2021-11-09 (×2): 81 mg via ORAL
  Filled 2021-11-07 (×2): qty 1

## 2021-11-07 MED ORDER — NITROGLYCERIN 0.4 MG SL SUBL: 0.4000 mg | SUBLINGUAL_TABLET | SUBLINGUAL | Status: DC | PRN

## 2021-11-07 MED ORDER — HEPARIN SODIUM (PORCINE) 5000 UNIT/ML IJ SOLN
4000.0000 [IU] | Freq: Once | INTRAMUSCULAR | Status: AC
  Administered 2021-11-07: 4000 [IU] via INTRAVENOUS
  Filled 2021-11-07: qty 1

## 2021-11-07 MED ORDER — PANTOPRAZOLE SODIUM 40 MG PO TBEC
40.0000 mg | DELAYED_RELEASE_TABLET | Freq: Every morning | ORAL | Status: DC
  Administered 2021-11-08 – 2021-11-09 (×2): 40 mg via ORAL
  Filled 2021-11-07 (×2): qty 1

## 2021-11-07 MED ORDER — INSULIN ASPART 100 UNIT/ML IJ SOLN
0.0000 [IU] | Freq: Three times a day (TID) | INTRAMUSCULAR | Status: DC
  Administered 2021-11-08: 2 [IU] via SUBCUTANEOUS
  Administered 2021-11-09: 5 [IU] via SUBCUTANEOUS
  Filled 2021-11-07 (×3): qty 1

## 2021-11-07 MED ORDER — FUROSEMIDE 10 MG/ML IJ SOLN
60.0000 mg | Freq: Once | INTRAMUSCULAR | Status: AC
Start: 2021-11-07 — End: 2021-11-07
  Administered 2021-11-07: 60 mg via INTRAVENOUS
  Filled 2021-11-07: qty 8

## 2021-11-07 MED ORDER — ACETAMINOPHEN 325 MG PO TABS: 650.0000 mg | ORAL_TABLET | ORAL | Status: DC | PRN

## 2021-11-07 MED ORDER — HEPARIN (PORCINE) 25000 UT/250ML-% IV SOLN
1400.0000 [IU]/h | INTRAVENOUS | Status: DC
  Administered 2021-11-07: 1400 [IU]/h via INTRAVENOUS
  Filled 2021-11-07: qty 250

## 2021-11-07 MED ORDER — CITALOPRAM HYDROBROMIDE 20 MG PO TABS
20.0000 mg | ORAL_TABLET | Freq: Every day | ORAL | Status: DC
  Administered 2021-11-08 – 2021-11-09 (×2): 20 mg via ORAL
  Filled 2021-11-07 (×2): qty 1

## 2021-11-07 MED ORDER — INSULIN ASPART 100 UNIT/ML IJ SOLN: 0.0000 [IU] | Freq: Every day | INTRAMUSCULAR | Status: DC

## 2021-11-07 MED ORDER — INSULIN GLARGINE-YFGN 100 UNIT/ML ~~LOC~~ SOLN
10.0000 [IU] | Freq: Every day | SUBCUTANEOUS | Status: DC
  Administered 2021-11-08 (×2): 10 [IU] via SUBCUTANEOUS
  Filled 2021-11-07 (×4): qty 0.1

## 2021-11-07 MED ORDER — LEVOTHYROXINE SODIUM 50 MCG PO TABS
75.0000 ug | ORAL_TABLET | Freq: Every day | ORAL | Status: DC
  Administered 2021-11-08 – 2021-11-09 (×2): 75 ug via ORAL
  Filled 2021-11-07 (×2): qty 1

## 2021-11-07 MED ORDER — LEVOTHYROXINE SODIUM 50 MCG PO TABS: 75.0000 ug | ORAL_TABLET | Freq: Every day | ORAL | Status: DC

## 2021-11-07 MED ORDER — ASPIRIN 81 MG PO CHEW
324.0000 mg | CHEWABLE_TABLET | Freq: Once | ORAL | Status: AC
  Administered 2021-11-07: 324 mg via ORAL
  Filled 2021-11-07: qty 4

## 2021-11-07 NOTE — Assessment & Plan Note (Signed)
Continue carvedilol and losartan.  Hold amlodipine

## 2021-11-07 NOTE — H&P (Signed)
History and Physical    Patient: Steve Alvarado NGE:[id number] DOB: [birth year] DOA: 11/07/2021 DOS: the patient was seen and examined on 11/07/2021 PCP: Ellery Plunk, MD  Patient coming from: SNF  Chief Complaint:  Chief Complaint  Patient presents with   Hematuria    HPI: Steve Alvarado is a 66 y.o. 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 coli UTI in June 2023 associated with AKI, currently resident of SNF, who presents to the ED initially for weakness and hematuria that started the day prior.  While awaiting triage he complained of chest pain and shortness of breath and was brought back to the ED.  He had no nausea, vomiting or diaphoresis. ED course and data review: BP 141/68 with pulse 77, O2 sat 76 on room air requiring 2 L to maintain sats in the mid to high 90s. Labs: Hemoglobin 8.8, down from 9.9 in June 2023.  Troponin 190-243 with BNP 464.  EKG, personally viewed and interpreted showed sinus at 83 with nonspecific ST-T wave changes.  Chest x-ray showed cardiomegaly with findings of CHF.  Pneumonia not excluded Patient treated with aspirin 324, Lasix 60 mg and started on a heparin bolus.  Hospitalist consulted for admission. Urinalysis pending at the time of admission     Past Medical History:  Diagnosis Date   Coronary artery disease    Diabetes mellitus    Type II   Hyperlipidemia    Hypertension    Thyroid disease    Past Surgical History:  Procedure Laterality Date   CARDIAC CATHETERIZATION  08/06/2010   Bare metal stent placed in RCA.   HAND SURGERY     left   Social History:  reports that he has never smoked. He has never used smokeless tobacco. He reports current drug use. He reports that he does not drink alcohol.  Allergies  Allergen  Reactions   Ace Inhibitors Cough    Reaction not listed on MAR    Family History  Problem Relation Age of Onset   Heart attack Mother     Prior to Admission medications   Medication Sig Start Date End Date Taking? Authorizing Provider  amLODipine (NORVASC) 10 MG tablet Take 1 tablet (10 mg total) by mouth every morning. 08/19/21   de Saintclair Halsted, Cortney E, NP  ASPERCREME LIDOCAINE 4 % 1 patch See admin instructions. Apply 1 patch topically daily in the morning 07/12/21   [provider]  aspirin EC 81 MG tablet Take 1 tablet (81 mg total) by mouth daily. Swallow whole. 08/18/21   Elmer Picker, NP  atorvastatin (LIPITOR) 40 MG tablet Take 1 tablet (40 mg total) by mouth every evening. Patient taking differently: Take 40 mg by mouth daily. 10/23/20 08/20/21  Delfino Lovett, MD  carvedilol (COREG) 3.125 MG tablet Take 1 tablet (3.125 mg total) by mouth 2 (two) times daily with a meal. Patient taking differently: Take 3.125 mg by mouth in the morning and at bedtime. 10/23/20 08/20/21  Delfino Lovett, MD  cephALEXin (KEFLEX) 500 MG capsule Take 1 capsule (500 mg total) by mouth every 8 (eight) hours. 08/25/21   Lorin Glass, MD  citalopram (CELEXA) 20 MG tablet Take 1 tablet (20 mg total) by mouth daily. 10/23/20 08/20/21  Delfino Lovett, MD  clopidogrel (PLAVIX) 75 MG tablet Take 1 tablet (75 mg total)  by mouth daily. Patient taking differently: Take 75 mg by mouth at bedtime. 08/18/21   Elmer Picker, NP  gabapentin (NEURONTIN) 400 MG capsule Take 400 mg by mouth 3 (three) times daily. 8am, 12pm 4pm 06/03/21   [provider]  insulin glargine-yfgn (SEMGLEE) 100 UNIT/ML injection Inject 0.1 mLs (10 Units total) into the skin at bedtime. 08/17/21   Elmer Picker, NP  insulin lispro (HUMALOG) 100 UNIT/ML injection Inject 0.03 mLs (3 Units total) into the skin 3 (three) times daily with meals. 10/23/20   Delfino Lovett, MD  levothyroxine (SYNTHROID) 75 MCG tablet Take 75 mcg by mouth at bedtime. 2200 05/19/21    [provider]  lidocaine 4 % Place 1 patch onto the skin daily. Apply one patch topically to left abdomen every morning - leave on for 12 hours. 12 hours on and 12 hours off.    [provider]  losartan (COZAAR) 50 MG tablet Take 50 mg by mouth daily. 05/19/21   [provider]  metFORMIN (GLUCOPHAGE) 1000 MG tablet Take 1,000 mg by mouth 2 (two) times daily. 05/19/21   [provider]  pantoprazole (PROTONIX) 40 MG tablet Take 40 mg by mouth every morning. 05/19/21   [provider]  polyethylene glycol (MIRALAX / GLYCOLAX) 17 g packet Take 17 g by mouth daily as needed for mild constipation. 08/22/21   Lorin Glass, MD    Physical Exam: Vitals:   11/07/21 1432 11/07/21 1845 11/07/21 2028 11/07/21 2209  BP:  (!) 151/83 (!) 153/86   Pulse:  81 79   Resp:  17 17   Temp:   98.1 F (36.7 C)   TempSrc:   Oral   SpO2: 91% 96% 98%   Weight:    99.8 kg   Physical Exam Vitals and nursing note reviewed.  Constitutional:      General: He is not in acute distress. HENT:     Head: Normocephalic and atraumatic.  Cardiovascular:     Rate and Rhythm: Normal rate and regular rhythm.     Heart sounds: Normal heart sounds.  Pulmonary:     Effort: Pulmonary effort is normal.     Breath sounds: Normal breath sounds.  Abdominal:     Palpations: Abdomen is soft.     Tenderness: There is no abdominal tenderness.  Neurological:     Mental Status: Mental status is at baseline.     Labs on Admission: I have personally reviewed following labs and imaging studies  CBC: Recent Labs  Lab 11/07/21 1442  WBC 5.9  HGB 8.8*  HCT 26.8*  MCV 86.2  PLT 232   Basic Metabolic Panel: Recent Labs  Lab 11/07/21 1442  NA 138  K 3.4*  CL 104  CO2 26  GLUCOSE 139*  BUN 24*  CREATININE 1.14  CALCIUM 8.9   GFR: Estimated Creatinine Clearance: 79.3 mL/min (by C-G formula based on SCr of 1.14 mg/dL). Liver Function Tests: No results for input(s): "AST",  "ALT", "ALKPHOS", "BILITOT", "PROT", "ALBUMIN" in the last 168 hours. No results for input(s): "LIPASE", "AMYLASE" in the last 168 hours. No results for input(s): "AMMONIA" in the last 168 hours. Coagulation Profile: Recent Labs  Lab 11/07/21 2115  INR 1.0   Cardiac Enzymes: No results for input(s): "CKTOTAL", "CKMB", "CKMBINDEX", "TROPONINI" in the last 168 hours. BNP (last 3 results) No results for input(s): "PROBNP" in the last 8760 hours. HbA1C: No results for input(s): "HGBA1C" in the last 72 hours. CBG: Recent Labs  Lab  11/07/21 2240  GLUCAP 100*   Lipid Profile: No results for input(s): "CHOL", "HDL", "LDLCALC", "TRIG", "CHOLHDL", "LDLDIRECT" in the last 72 hours. Thyroid Function Tests: No results for input(s): "TSH", "T4TOTAL", "FREET4", "T3FREE", "THYROIDAB" in the last 72 hours. Anemia Panel: No results for input(s): "VITAMINB12", "FOLATE", "FERRITIN", "TIBC", "IRON", "RETICCTPCT" in the last 72 hours. Urine analysis:    Component Value Date/Time   COLORURINE AMBER (A) 08/20/2021 0004   APPEARANCEUR CLOUDY (A) 08/20/2021 0004   APPEARANCEUR Clear 08/22/2013 0057   LABSPEC 1.023 08/20/2021 0004   LABSPEC 1.017 08/22/2013 0057   PHURINE 8.0 08/20/2021 0004   GLUCOSEU NEGATIVE 08/20/2021 0004   GLUCOSEU >=500 08/22/2013 0057   HGBUR NEGATIVE 08/20/2021 0004   BILIRUBINUR NEGATIVE 08/20/2021 0004   BILIRUBINUR Negative 08/22/2013 0057   KETONESUR NEGATIVE 08/20/2021 0004   PROTEINUR 100 (A) 08/20/2021 0004   NITRITE NEGATIVE 08/20/2021 0004   LEUKOCYTESUR SMALL (A) 08/20/2021 0004   LEUKOCYTESUR Negative 08/22/2013 0057    Radiological Exams on Admission: DG Chest Port 1 View  Result Date: 11/07/2021 CLINICAL DATA:  Chest pain and fever. EXAM: PORTABLE CHEST 1 VIEW COMPARISON:  Chest radiograph dated 08/20/2021. FINDINGS: Cardiomegaly with vascular congestion and edema. Pneumonia is not excluded. Trace left pleural effusion may be present. No pneumothorax. No  acute osseous pathology. IMPRESSION: Cardiomegaly with findings of CHF. Pneumonia is not excluded. Electronically Signed   By: Anner Crete M.D.   On: 11/07/2021 19:43     Data Reviewed: Relevant notes from primary care and specialist visits, past discharge summaries as available in EHR, including Care Everywhere. Prior diagnostic testing as pertinent to current admission diagnoses Updated medications and problem lists for reconciliation ED course, including vitals, labs, imaging, treatment and response to treatment Triage notes, nursing and pharmacy notes and ED provider's notes Notable results as noted in HPI   Assessment and Plan: * NSTEMI, Type ll (non-ST elevated myocardial infarction) ([place]) CAD with history of stent Patient with chest pain, troponin [phone] with no acute EKG changes History of PCI with stents.  Negative stress test October 2022 Suspecting type II NSTEMI related to supply/demand mismatch related to symptomatic anemia  Patient was started on heparin from the ED We will continue heparin with close monitoring of H&H and monitoring for continuing hematuria and may consider discontinuing heparin if stronger consideration for supply/demand mismatch Will hold aspirin.  Continue atorvastatin carvedilol and losartan Nitroglycerin sublingual as needed chest pain with close monitoring of blood pressure Continue to trend troponins Addendum: Postadmission, troponin trend flattening 190-240- 245 in the setting of downtrending H&H 8.8-8.2, decision made to discontinue heparin.  No further chest pain.  CTA negative for PE.  No hematuria on UA.  Acute on chronic diastolic CHF (congestive heart failure) (HCC) Possible trigger symptomatic anemia versus ACS Patient symptomatic for shortness of breath, BNP 464 with CHF on chest x-ray Echo May 2023 showing G1 DD with EF 60 to 65% Continue IV Lasix, carvedilol and losartan Daily weights with intake and output  monitoring   Urinary tract infection History of E. coli UTI June 2023 Urinalysis resulting postadmission, consistent with UTI Rocephin started Follow cultures  Hematuria Anemia, probably chronic blood loss Patient was started on heparin from the ED UA without evidence of hematuria  Monitor H&H If worsening hematuria will likely hold heparin and continue to trend troponins    Latest Ref Rng & Units 11/07/2021    2:42 PM 08/21/2021    2:10 AM 08/19/2021   11:47 PM  CBC  WBC 4.0 - 10.5 K/uL 5.9  6.2    Hemoglobin 13.0 - 17.0 g/dL 8.8  9.9  10.5   Hematocrit 39.0 - 52.0 % 26.8  30.2  31.0   Platelets 150 - 400 K/uL 232  185       Acute respiratory failure with hypoxia (HCC) Shortness of breath, tachypnea, O2 sat was 74% in the ED requiring 2 L O2 to remain in the mid to high 90s Etiology likely related to CHF Chest x-ray was clear We will get D-dimer to see whether to further explore with CTA chest for possibility of PE Addendum: D-dimer 1.26 and CTA chest done which was negative for PE  Essential hypertension Continue carvedilol and losartan.  Hold amlodipine  Hypothyroidism Continue levothyroxine  Hemiparesis affecting left side as late effect of cerebrovascular accident (CVA) ([place]) Holding aspirin due to being on heparin Continue atorvastatin Increase nursing assistance for transfers  June 2023)      DVT prophylaxis: On heparin infusion  Consults: cardiology, [facility]  Advance Care Planning: full  Family Communication: none  Disposition Plan: Back to previous home environment  Severity of Illness: The appropriate patient status for this patient is INPATIENT. Inpatient status is judged to be reasonable and necessary in order to provide the required intensity of service to ensure the patient's safety. The patient's presenting symptoms, physical exam findings, and initial radiographic and laboratory data in the context of their chronic comorbidities is felt to place  them at high risk for further clinical deterioration. Furthermore, it is not anticipated that the patient will be medically stable for discharge from the hospital within 2 midnights of admission.   * I certify that at the point of admission it is my clinical judgment that the patient will require inpatient hospital care spanning beyond 2 midnights from the point of admission due to high intensity of service, high risk for further deterioration and high frequency of surveillance required.*  Author: Athena Masse, MD 11/07/2021 10:43 PM  For on call review [email or web address].

## 2021-11-07 NOTE — ED Provider Notes (Signed)
[facility] Provider Note    Event Date/Time   First MD Initiated Contact with Patient 11/07/21 Paulo Fruit     (approximate)   History   Hematuria   HPI  Steve Alvarado is a 66 y.o. male who presents to the ED for evaluation of Hematuria   I review 6/7 medical discharge summary.  History of HTN, HLD, DM, CAD, hypothyroidism, cocaine abuse and stroke.  Residual left-sided weakness.  Patient presents to the ED for evaluation of an episode of hematuria/dysuria that occurred yesterday, as well as an episode of chest pain.  He reports being helped up on a lift to void yesterday when he felt dysuria and the aide who is helping him told him there was blood coming from his penis.  Reports it had a foul smell.  Patient further reports that he had an episode of chest pressure while in the waiting room, since resolved.  Reports his breathing sometimes feels heavy, particularly when he lies flat or exerting self.   Physical Exam   Triage Vital Signs: ED Triage Vitals [11/07/21 1430]  Enc Vitals Group     BP (!) 141/68     Pulse Rate 77     Resp 17     Temp 98.5 F (36.9 C)     Temp Source Oral     SpO2 (!) 76 %     Weight      Height      Head Circumference      Peak Flow      Pain Score 2     Pain Loc      Pain Edu?      Excl. in GC?     Most recent vital signs: Vitals:   11/07/21 2028 11/07/21 2300  BP: (!) 153/86 (!) 152/82  Pulse: 79 76  Resp: 17 14  Temp: 98.1 F (36.7 C)   SpO2: 98% 99%    General: Awake, no distress.  CV:  Good peripheral perfusion.  Resp:  Normal effort.  Requiring nasal cannula Abd:  No distention.  MSK:  No deformity noted.  Trace pitting edema to bilateral lower extremities. Neuro:  Chronic left-sided deficits are noted, 3/5 strength of the left leg, 1/5 strength to the left arm. Other:     ED Results / Procedures / Treatments   Labs (all labs ordered are listed, but only abnormal results are displayed) Labs  Reviewed  CBC - Abnormal; Notable for the following components:      Result Value   RBC 3.11 (*)    Hemoglobin 8.8 (*)    HCT 26.8 (*)    All other components within normal limits  BASIC METABOLIC PANEL - Abnormal; Notable for the following components:   Potassium 3.4 (*)    Glucose, Bld 139 (*)    BUN 24 (*)    All other components within normal limits  BRAIN NATRIURETIC PEPTIDE - Abnormal; Notable for the following components:   B Natriuretic Peptide 464.5 (*)    All other components within normal limits  CBG MONITORING, ED - Abnormal; Notable for the following components:   Glucose-Capillary 100 (*)    All other components within normal limits  TROPONIN I (HIGH SENSITIVITY) - Abnormal; Notable for the following components:   Troponin I (High Sensitivity) 190 (*)    All other components within normal limits  TROPONIN I (HIGH SENSITIVITY) - Abnormal; Notable for the following components:   Troponin I (High Sensitivity) 243 (*)  All other components within normal limits  PROTIME-INR  APTT  URINALYSIS, ROUTINE W REFLEX MICROSCOPIC  HEPARIN LEVEL (UNFRACTIONATED)  HEMOGLOBIN  LIPOPROTEIN A (LPA)  BASIC METABOLIC PANEL  CBC  D-DIMER, QUANTITATIVE  TROPONIN I (HIGH SENSITIVITY)    EKG Sinus rhythm with a rate of 83 bpm.  Normal axis and intervals.  Nonspecific ST changes inferiorly and laterally without STEMI. EKG from June is a fairly similar morphology  RADIOLOGY 1 view CXR interpreted by me with cardiomegaly and pulmonary vascular congestion  Official radiology report(s): DG Chest Port 1 View  Result Date: 11/07/2021 CLINICAL DATA:  Chest pain and fever. EXAM: PORTABLE CHEST 1 VIEW COMPARISON:  Chest radiograph dated 08/20/2021. FINDINGS: Cardiomegaly with vascular congestion and edema. Pneumonia is not excluded. Trace left pleural effusion may be present. No pneumothorax. No acute osseous pathology. IMPRESSION: Cardiomegaly with findings of CHF. Pneumonia is not  excluded. Electronically Signed   By: Elgie Collard M.D.   On: 11/07/2021 19:43    PROCEDURES and INTERVENTIONS:  .1-3 Lead EKG Interpretation  Performed by: Delton Prairie, MD Authorized by: Delton Prairie, MD     Interpretation: normal     ECG rate:  80   ECG rate assessment: normal     Rhythm: sinus rhythm     Ectopy: none     Conduction: normal   .Critical Care  Performed by: Delton Prairie, MD Authorized by: Delton Prairie, MD   Critical care provider statement:    Critical care time (minutes):  30   Critical care time was exclusive of:  Separately billable procedures and treating other patients   Critical care was necessary to treat or prevent imminent or life-threatening deterioration of the following conditions:  Cardiac failure, circulatory failure and respiratory failure   Critical care was time spent personally by me on the following activities:  Development of treatment plan with patient or surrogate, discussions with consultants, evaluation of patient's response to treatment, examination of patient, ordering and review of laboratory studies, ordering and review of radiographic studies, ordering and performing treatments and interventions, pulse oximetry, re-evaluation of patient's condition and review of old charts   Medications  heparin ADULT infusion 100 units/mL (25000 units/2109mL) (1,400 Units/hr Intravenous New Bag/Given 11/07/21 2224)  insulin glargine-yfgn (SEMGLEE) injection 10 Units (has no administration in time range)  pantoprazole (PROTONIX) EC tablet 40 mg (has no administration in time range)  citalopram (CELEXA) tablet 20 mg (has no administration in time range)  losartan (COZAAR) tablet 50 mg (has no administration in time range)  aspirin EC tablet 81 mg (has no administration in time range)  nitroGLYCERIN (NITROSTAT) SL tablet 0.4 mg (has no administration in time range)  acetaminophen (TYLENOL) tablet 650 mg (has no administration in time range)  ondansetron  (ZOFRAN) injection 4 mg (has no administration in time range)  insulin aspart (novoLOG) injection 0-15 Units (has no administration in time range)  insulin aspart (novoLOG) injection 0-5 Units (has no administration in time range)  furosemide (LASIX) injection 40 mg (has no administration in time range)  levothyroxine (SYNTHROID) tablet 75 mcg (has no administration in time range)  aspirin chewable tablet 324 mg (324 mg Oral Given 11/07/21 2121)  heparin injection 4,000 Units (4,000 Units Intravenous Given 11/07/21 2201)  furosemide (LASIX) injection 60 mg (60 mg Intravenous Given 11/07/21 2228)     IMPRESSION / MDM / ASSESSMENT AND PLAN / ED COURSE  I reviewed the triage vital signs and the nursing notes.  Differential diagnosis includes, but is not  limited to, ACS, PTX, PNA, muscle strain/spasm, PE, dissection, cystitis, sepsis  {Patient presents with symptoms of an acute illness or injury that is potentially life-threatening.  Pleasant 66 year old male presents to the ED with stigmata of symptomatic CHF, NSTEMI and requiring medical admission with heparinization.  While he is triaged with hematuria, after reporting episode of hematuria/dysuria that occurred yesterday, I am most concerned about his cardiopulmonary symptoms.  He is noted to be hypoxic and requiring a new 2 L nasal cannula.  An episode of chest discomfort in the waiting room, but none during my evaluation.  EKG with some nonspecific changes but certainly no STEMI.  He is provided aspirin.  Troponins returned elevated to the 200s and he is started on a heparin drip.  CXR with evidence of volume overload and his symptoms are concerning for symptomatic CHF.  Elevated BNP is noted.  Normocytic anemia is noted, but he has no bleeding symptoms.  No significant metabolic derangements on panel.  We will consult medicine for admission.  Urinalysis is pending      FINAL CLINICAL IMPRESSION(S) / ED DIAGNOSES   Final diagnoses:  NSTEMI  (non-ST elevated myocardial infarction) (HCC)     Rx / DC Orders   ED Discharge Orders     None        Note:  This document was prepared using Dragon voice recognition software and may include unintentional dictation errors.   Delton Prairie, MD 11/07/21 [id number]

## 2021-11-07 NOTE — ED Notes (Signed)
Attempted IV access x2 unsuccessful 

## 2021-11-07 NOTE — Assessment & Plan Note (Addendum)
CAD with history of stent Patient with chest pain, troponin [phone] with no acute EKG changes History of PCI with stents.  Negative stress test October 2022 Suspecting type II NSTEMI related to supply/demand mismatch related to symptomatic anemia  Patient was started on heparin from the ED We will continue heparin with close monitoring of H&H and monitoring for continuing hematuria and may consider discontinuing heparin if stronger consideration for supply/demand mismatch Will hold aspirin.  Continue atorvastatin carvedilol and losartan Nitroglycerin sublingual as needed chest pain with close monitoring of blood pressure Continue to trend troponins Addendum: Postadmission, troponin trend flattening 190-240- 245 in the setting of downtrending H&H 8.8-8.2, decision made to discontinue heparin.  No further chest pain.  CTA negative for PE.  No hematuria on UA.

## 2021-11-07 NOTE — Progress Notes (Signed)
ANTICOAGULATION CONSULT NOTE - Initial Consult  Pharmacy Consult for Heparin  Indication: chest pain/ACS  Allergies  Allergen Reactions   Ace Inhibitors Cough    Reaction not listed on MAR    Patient Measurements: Weight: 99.8 kg (220 lb) Heparin Dosing Weight: 99.8 kg   Vital Signs: Temp: 98.1 F (36.7 C) (08/20 2028) Temp Source: Oral (08/20 2028) BP: 153/86 (08/20 2028) Pulse Rate: 79 (08/20 2028)  Labs: Recent Labs    11/07/21 1442 11/07/21 2115  HGB 8.8*  --   HCT 26.8*  --   PLT 232  --   APTT  --  33  LABPROT  --  13.4  INR  --  1.0  CREATININE 1.14  --   TROPONINIHS 190* 243*    Estimated Creatinine Clearance: 79.3 mL/min (by C-G formula based on SCr of 1.14 mg/dL).   Medical History: Past Medical History:  Diagnosis Date   Coronary artery disease    Diabetes mellitus    Type II   Hyperlipidemia    Hypertension    Thyroid disease     Medications:  (Not in a hospital admission)   Assessment: Pharmacy consulted to dose heparin in this 66 year old male admitted with ACS/NSTEMI.  No prior anticoag noted.  CrCl = 79.3 ml/min  Goal of Therapy:  Heparin level 0.3-0.7 units/ml Monitor platelets by anticoagulation protocol: Yes   Plan:  Give 4000 units bolus x 1 Start heparin infusion at 1400 units/hr Check anti-Xa level in 6 hours and daily while on heparin Continue to monitor H&H and platelets  Kateleen Encarnacion D 11/07/2021,10:19 PM

## 2021-11-07 NOTE — Assessment & Plan Note (Signed)
Continue levothyroxine 

## 2021-11-07 NOTE — Assessment & Plan Note (Addendum)
Anemia, probably chronic blood loss Patient was started on heparin from the ED UA without evidence of hematuria  Monitor H&H If worsening hematuria will likely hold heparin and continue to trend troponins    Latest Ref Rng & Units 11/07/2021    2:42 PM 08/21/2021    2:10 AM 08/19/2021   11:47 PM  CBC  WBC 4.0 - 10.5 K/uL 5.9  6.2    Hemoglobin 13.0 - 17.0 g/dL 8.8  9.9  04.5   Hematocrit 39.0 - 52.0 % 26.8  30.2  31.0   Platelets 150 - 400 K/uL 232  185

## 2021-11-07 NOTE — ED Provider Triage Note (Signed)
  Emergency Medicine Provider Triage Evaluation Note  Steve Alvarado , a 66 y.o.male,  was evaluated in triage.  Pt complains of hematuria that was noticed by [facility] staff yesterday.  He endorses some burning sensation whenever he urinates as well.  Denies any other symptoms at this time.   Review of Systems  Positive: Dysuria, hematuria Negative: Denies fever, chest pain, vomiting  Physical Exam   Vitals:   11/07/21 1430 11/07/21 1432  BP: (!) 141/68   Pulse: 77   Resp: 17   Temp: 98.5 F (36.9 C)   SpO2: (!) 76% 91%   Gen:   Awake, no distress   Resp:  Normal effort  MSK:   Moves extremities without difficulty  Other:    Medical Decision Making  Given the patient's initial medical screening exam, the following diagnostic evaluation has been ordered. The patient will be placed in the appropriate treatment space, once one is available, to complete the evaluation and treatment. I have discussed the plan of care with the patient and I have advised the patient that an ED physician or mid-level practitioner will reevaluate their condition after the test results have been received, as the results may give them additional insight into the type of treatment they may need.    Diagnostics: Labs, UA  Treatments: none immediately   Varney Daily, [place] 11/07/21 1515

## 2021-11-07 NOTE — Assessment & Plan Note (Addendum)
Possible trigger symptomatic anemia versus ACS Patient symptomatic for shortness of breath, BNP 464 with CHF on chest x-ray Echo May 2023 showing G1 DD with EF 60 to 65% Continue IV Lasix, carvedilol and losartan Daily weights with intake and output monitoring

## 2021-11-07 NOTE — ED Triage Notes (Signed)
Pt reports weakness and hematuria that started yesterday.

## 2021-11-07 NOTE — Assessment & Plan Note (Signed)
Holding aspirin due to being on heparin Continue atorvastatin Increase nursing assistance for transfers

## 2021-11-07 NOTE — Assessment & Plan Note (Signed)
Shortness of breath, tachypnea, O2 sat was 74% in the ED requiring 2 L O2 to remain in the mid to high 90s Etiology likely related to CHF Chest x-ray was clear We will get D-dimer to see whether to further explore with CTA chest for possibility of PE Addendum: D-dimer 1.26 and CTA chest done which was negative for PE

## 2021-11-07 NOTE — ED Triage Notes (Signed)
Pt in via EMS from [facility] with c/o bleeding from penis. EMS reports pt states had a UTI this time last year but it did not bleed. Pt reports pain with urination and an odor to his urine. 100.4 temp orally, CBG 181, RR22, 94% RA, HR 82. Pt with left sided paralysis

## 2021-11-08 ENCOUNTER — Encounter: Payer: Self-pay | Admitting: Internal Medicine

## 2021-11-08 ENCOUNTER — Inpatient Hospital Stay (HOSPITAL_COMMUNITY)
Admit: 2021-11-08 | Discharge: 2021-11-08 | Disposition: A | Discharge status: Home / Self Care | Payer: Medicare Other | Attending: Nurse Practitioner | Admitting: Nurse Practitioner

## 2021-11-08 ENCOUNTER — Inpatient Hospital Stay: Payer: Medicare Other

## 2021-11-08 DIAGNOSIS — I248 Other forms of acute ischemic heart disease: Secondary | ICD-10-CM

## 2021-11-08 DIAGNOSIS — R079 Chest pain, unspecified: Secondary | ICD-10-CM | POA: Diagnosis not present

## 2021-11-08 DIAGNOSIS — N390 Urinary tract infection, site not specified: Secondary | ICD-10-CM

## 2021-11-08 LAB — CBC
HCT: 26.2 % — ABNORMAL LOW (ref 39.0–52.0)
Hemoglobin: 8.7 g/dL — ABNORMAL LOW (ref 13.0–17.0)
MCH: 28.4 pg (ref 26.0–34.0)
MCHC: 33.2 g/dL (ref 30.0–36.0)
MCV: 85.6 fL (ref 80.0–100.0)
Platelets: 218 10*3/uL (ref 150–400)
RBC: 3.06 MIL/uL — ABNORMAL LOW (ref 4.22–5.81)
RDW: 13.2 % (ref 11.5–15.5)
WBC: 5.3 10*3/uL (ref 4.0–10.5)
nRBC: 0 % (ref 0.0–0.2)

## 2021-11-08 LAB — IRON AND TIBC
Iron: 43 ug/dL — ABNORMAL LOW (ref 45–182)
Saturation Ratios: 17 % — ABNORMAL LOW (ref 17.9–39.5)
TIBC: 255 ug/dL (ref 250–450)
UIBC: 212 ug/dL

## 2021-11-08 LAB — ECHOCARDIOGRAM LIMITED
AR max vel: 2.43 cm2
AV Area VTI: 2.75 cm2
AV Area mean vel: 2.55 cm2
AV Mean grad: 3 mmHg
AV Peak grad: 4.8 mmHg
Ao pk vel: 1.09 m/s
Area-P 1/2: 2.95 cm2
Height: 74 in
S' Lateral: 2.84 cm
Weight: 3262.81 oz

## 2021-11-08 LAB — BASIC METABOLIC PANEL
Anion gap: 8 (ref 5–15)
BUN: 22 mg/dL (ref 8–23)
CO2: 28 mmol/L (ref 22–32)
Calcium: 8.9 mg/dL (ref 8.9–10.3)
Chloride: 104 mmol/L (ref 98–111)
Creatinine, Ser: 1.04 mg/dL (ref 0.61–1.24)
GFR, Estimated: >60 mL/min (ref >60)
Glucose, Bld: 110 mg/dL — ABNORMAL HIGH (ref 70–99)
Potassium: 3.2 mmol/L — ABNORMAL LOW (ref 3.5–5.1)
Sodium: 140 mmol/L (ref 135–145)

## 2021-11-08 LAB — URINALYSIS, ROUTINE W REFLEX MICROSCOPIC
Bilirubin Urine: NEGATIVE
Glucose, UA: NEGATIVE mg/dL
Ketones, ur: NEGATIVE mg/dL
Nitrite: NEGATIVE
Protein, ur: 100 mg/dL — AB
Specific Gravity, Urine: 1.01 (ref 1.005–1.030)
pH: 5 (ref 5.0–8.0)

## 2021-11-08 LAB — HEMOGLOBIN: Hemoglobin: 8.2 g/dL — ABNORMAL LOW (ref 13.0–17.0)

## 2021-11-08 LAB — D-DIMER, QUANTITATIVE: D-Dimer, Quant: 1.26 ug/mL-FEU — ABNORMAL HIGH (ref 0.00–0.50)

## 2021-11-08 LAB — MAGNESIUM: Magnesium: 1.7 mg/dL (ref 1.7–2.4)

## 2021-11-08 LAB — GLUCOSE, CAPILLARY
Glucose-Capillary: 117 mg/dL — ABNORMAL HIGH (ref 70–99)
Glucose-Capillary: 135 mg/dL — ABNORMAL HIGH (ref 70–99)
Glucose-Capillary: 97 mg/dL (ref 70–99)
Glucose-Capillary: 95 mg/dL (ref 70–99)

## 2021-11-08 LAB — FERRITIN: Ferritin: 332 ng/mL (ref 24–336)

## 2021-11-08 LAB — TROPONIN I (HIGH SENSITIVITY): Troponin I (High Sensitivity): 245 ng/L (ref <18)

## 2021-11-08 MED ORDER — CARVEDILOL 3.125 MG PO TABS
3.1250 mg | ORAL_TABLET | Freq: Two times a day (BID) | ORAL | Status: DC
  Administered 2021-11-08: 3.125 mg via ORAL
  Filled 2021-11-08: qty 1

## 2021-11-08 MED ORDER — ENOXAPARIN SODIUM 40 MG/0.4ML IJ SOSY
40.0000 mg | PREFILLED_SYRINGE | INTRAMUSCULAR | Status: DC
  Administered 2021-11-08 – 2021-11-09 (×2): 40 mg via SUBCUTANEOUS
  Filled 2021-11-08 (×2): qty 0.4

## 2021-11-08 MED ORDER — ATORVASTATIN CALCIUM 20 MG PO TABS
40.0000 mg | ORAL_TABLET | Freq: Every day | ORAL | Status: DC
  Administered 2021-11-08 – 2021-11-09 (×2): 40 mg via ORAL
  Filled 2021-11-08 (×2): qty 2

## 2021-11-08 MED ORDER — ORAL CARE MOUTH RINSE: 15.0000 mL | OROMUCOSAL | Status: DC | PRN

## 2021-11-08 MED ORDER — SODIUM CHLORIDE 0.9 % IV SOLN
1.0000 g | INTRAVENOUS | Status: DC
  Administered 2021-11-08: 1 g via INTRAVENOUS
  Filled 2021-11-08: qty 10

## 2021-11-08 MED ORDER — LEVOTHYROXINE SODIUM 25 MCG PO TABS: 75.0000 ug | ORAL_TABLET | Freq: Every day | ORAL | Status: DC

## 2021-11-08 MED ORDER — DULOXETINE HCL 20 MG PO CPEP
20.0000 mg | ORAL_CAPSULE | Freq: Two times a day (BID) | ORAL | Status: DC
  Administered 2021-11-08 – 2021-11-09 (×3): 20 mg via ORAL
  Filled 2021-11-08 (×3): qty 1

## 2021-11-08 MED ORDER — ASPIRIN 81 MG PO TBEC
81.0000 mg | DELAYED_RELEASE_TABLET | Freq: Every day | ORAL | Status: DC
Start: 2021-11-08 — End: 2021-11-08

## 2021-11-08 MED ORDER — POTASSIUM CHLORIDE CRYS ER 20 MEQ PO TBCR
60.0000 meq | EXTENDED_RELEASE_TABLET | Freq: Once | ORAL | Status: AC
  Administered 2021-11-08: 60 meq via ORAL
  Filled 2021-11-08: qty 3

## 2021-11-08 MED ORDER — MELATONIN 5 MG PO TABS
5.0000 mg | ORAL_TABLET | Freq: Once | ORAL | Status: AC
  Administered 2021-11-08: 5 mg via ORAL
  Filled 2021-11-08: qty 1

## 2021-11-08 MED ORDER — CARVEDILOL 6.25 MG PO TABS
6.2500 mg | ORAL_TABLET | Freq: Two times a day (BID) | ORAL | Status: DC
  Administered 2021-11-08 – 2021-11-09 (×2): 6.25 mg via ORAL
  Filled 2021-11-08 (×2): qty 1

## 2021-11-08 MED ORDER — IOHEXOL 350 MG/ML SOLN
75.0000 mL | Freq: Once | INTRAVENOUS | Status: AC | PRN
  Administered 2021-11-08: 75 mL via INTRAVENOUS

## 2021-11-08 MED ORDER — ATORVASTATIN CALCIUM 20 MG PO TABS: 40.0000 mg | ORAL_TABLET | Freq: Every day | ORAL | Status: DC

## 2021-11-08 MED ORDER — AMLODIPINE BESYLATE 10 MG PO TABS
10.0000 mg | ORAL_TABLET | Freq: Every morning | ORAL | Status: DC
  Administered 2021-11-08 – 2021-11-09 (×2): 10 mg via ORAL
  Filled 2021-11-08 (×2): qty 1

## 2021-11-08 MED ORDER — CARVEDILOL 3.125 MG PO TABS: 3.1250 mg | ORAL_TABLET | Freq: Two times a day (BID) | ORAL | Status: DC

## 2021-11-08 MED ORDER — GABAPENTIN 400 MG PO CAPS
400.0000 mg | ORAL_CAPSULE | Freq: Three times a day (TID) | ORAL | Status: DC
  Administered 2021-11-08 – 2021-11-09 (×4): 400 mg via ORAL
  Filled 2021-11-08 (×4): qty 1

## 2021-11-08 NOTE — NC FL2 (Signed)
[place] MEDICAID FL2 LEVEL OF CARE SCREENING TOOL     IDENTIFICATION  Patient Name: Steve Alvarado Birthdate: 04-23-1955 Sex: male Admission Date (Current Location): 11/07/2021  [facility] and [place] Number:  Chiropodist and Address:  [facility], [address] [place], [place] [address]      Provider Number: [id number]  Attending Physician Name and Address:  Kathrynn Running, MD  Relative Name and Phone Number:       Current Level of Care: Hospital Recommended Level of Care: Skilled Nursing Facility Prior Approval Number:    Date Approved/Denied:   PASRR Number: [id number]  Discharge Plan: SNF    Current Diagnoses: Patient Active Problem List   Diagnosis Date Noted   Urinary tract infection 11/08/2021   NSTEMI, Type ll (non-ST elevated myocardial infarction) (HCC) 11/07/2021   Acute on chronic diastolic CHF (congestive heart failure) (HCC) 11/07/2021   Hematuria 11/07/2021   Acute on chronic blood loss anemia 11/07/2021   Hemiparesis affecting left side as late effect of cerebrovascular accident (CVA) (HCC) 11/07/2021   Acute respiratory failure with hypoxia (HCC) 11/07/2021   AKI (acute kidney injury) (HCC) 08/21/2021   Acute kidney injury (HCC) 08/20/2021   GERD without esophagitis 08/20/2021   Basal ganglia stroke (HCC) 08/12/2021   Type 2 diabetes mellitus with diabetic polyneuropathy, with long-term current use of insulin (HCC) 07/09/2021   Hypothyroidism 07/09/2021   Depression 10/21/2020   Non compliance w medication regimen 10/21/2020   Cocaine abuse (HCC) 09/05/2020   Adjustment disorder with emotional disturbance 09/05/2020   Essential hypertension 02/14/2011   Coronary artery disease involving native coronary artery of native heart without angina pectoris 08/13/2010   Stented coronary artery 08/13/2010    Orientation RESPIRATION BLADDER Height & Weight     Self, Time, Situation, Place  Normal Incontinent,  External catheter Weight: 203 lb 14.8 oz (92.5 kg) Height:  6\' 2"  (188 cm)  BEHAVIORAL SYMPTOMS/MOOD NEUROLOGICAL BOWEL NUTRITION STATUS   (None)  (None) Continent Diet (Heart healthy/carb modified)  AMBULATORY STATUS COMMUNICATION OF NEEDS Skin     Verbally Normal                       Personal Care Assistance Level of Assistance              Functional Limitations Info  Sight, Hearing, Speech Sight Info: Adequate Hearing Info: Adequate Speech Info: Adequate    SPECIAL CARE FACTORS FREQUENCY                       Contractures Contractures Info: Not present    Additional Factors Info  Code Status, Allergies Code Status Info: Full code Allergies Info: Ace Inhibitors           Current Medications (11/08/2021):  This is the current hospital active medication list Current Facility-Administered Medications  Medication Dose Route Frequency Provider Last Rate Last Admin   acetaminophen (TYLENOL) tablet 650 mg  650 mg Oral Q4H PRN 11/10/2021, MD       aspirin EC tablet 81 mg  81 mg Oral Daily Andris Baumann, MD   81 mg at 11/08/21 0907   atorvastatin (LIPITOR) tablet 40 mg  40 mg Oral Daily 11/10/21, NP   40 mg at 11/08/21 0907   carvedilol (COREG) tablet 3.125 mg  3.125 mg Oral BID WC 11/10/21, NP   3.125 mg at 11/08/21 0908   cefTRIAXone (  ROCEPHIN) 1 g in sodium chloride 0.9 % 100 mL IVPB  1 g Intravenous Q24H Andris Baumann, MD   Stopped at 11/08/21 8592   citalopram (CELEXA) tablet 20 mg  20 mg Oral Daily Lindajo Royal V, MD   20 mg at 11/08/21 0908   enoxaparin (LOVENOX) injection 40 mg  40 mg Subcutaneous Q24H Lindajo Royal V, MD   40 mg at 11/08/21 0910   furosemide (LASIX) injection 40 mg  40 mg Intravenous BID Lindajo Royal V, MD   40 mg at 11/08/21 0916   insulin aspart (novoLOG) injection 0-15 Units  0-15 Units Subcutaneous TID WC Andris Baumann, MD       insulin aspart (novoLOG) injection 0-5 Units  0-5 Units  Subcutaneous QHS Andris Baumann, MD       insulin glargine-yfgn [facility]) injection 10 Units  10 Units Subcutaneous QHS Andris Baumann, MD   10 Units at 11/08/21 0020   levothyroxine (SYNTHROID) tablet 75 mcg  75 mcg Oral Q0600 Andris Baumann, MD   75 mcg at 11/08/21 9244   losartan (COZAAR) tablet 50 mg  50 mg Oral Daily Lindajo Royal V, MD   50 mg at 11/08/21 0908   nitroGLYCERIN (NITROSTAT) SL tablet 0.4 mg  0.4 mg Sublingual Q5 Min x 3 PRN Andris Baumann, MD       ondansetron [facility]) injection 4 mg  4 mg Intravenous Q6H PRN Andris Baumann, MD       Oral care mouth rinse  15 mL Mouth Rinse PRN Andris Baumann, MD       pantoprazole (PROTONIX) EC tablet 40 mg  40 mg Oral q morning Andris Baumann, MD   40 mg at 11/08/21 0908   potassium chloride SA (KLOR-CON M) CR tablet 60 mEq  60 mEq Oral Once Wouk, Wilfred Curtis, MD         Discharge Medications: Please see discharge summary for a list of discharge medications.  Relevant Imaging Results:  Relevant Lab Results:   Additional Information SS#: [id number]  Margarito Liner, LCSW

## 2021-11-08 NOTE — TOC Initial Note (Signed)
Transition of Care [facility]) - Initial/Assessment Note    Patient Details  Name: Steve Alvarado MRN: [id number] Date of Birth: [birth year]  Transition of Care [facility]) CM/SW Contact:    Margarito Liner, LCSW Phone Number: 11/08/2021, 11:10 AM  Clinical Narrative:   Per chart review, patient admitted from [facility]. Admissions coordinator confirmed he is a long-term resident. CSW will continue to follow patient for support and facilitate return to SNF once medically stable.               Expected Discharge Plan: Skilled Nursing Facility Barriers to Discharge: Continued Medical Work up   Patient Goals and CMS Choice        Expected Discharge Plan and Services Expected Discharge Plan: Skilled Nursing Facility       Living arrangements for the past 2 months: Skilled Nursing Facility                                      Prior Living Arrangements/Services Living arrangements for the past 2 months: Skilled Nursing Facility Lives with:: Facility Resident Patient language and need for interpreter reviewed:: Yes Do you feel safe going back to the place where you live?: Yes      Need for Family Participation in Patient Care: Yes (Comment) Care giver support system in place?: Yes (comment)   Criminal Activity/Legal Involvement Pertinent to Current Situation/Hospitalization: No - Comment as needed  Activities of Daily Living Home Assistive Devices/Equipment: Dentures (specify type), Eyeglasses, Nurse, adult, Shower chair with back, Transfer belt, Wheelchair ADL Screening (condition at time of admission) Patient's cognitive ability adequate to safely complete daily activities?: Yes Is the patient deaf or have difficulty hearing?: No Does the patient have difficulty seeing, even when wearing glasses/contacts?: No Does the patient have difficulty concentrating, remembering, or making decisions?: No Patient able to express need for assistance with ADLs?: Yes Does the  patient have difficulty dressing or bathing?: Yes Independently performs ADLs?: No Communication: Independent Dressing (OT): Needs assistance Is this a change from baseline?: Pre-admission baseline Grooming: Independent Feeding: Independent Bathing: Needs assistance Is this a change from baseline?: Pre-admission baseline Toileting: Needs assistance Is this a change from baseline?: Pre-admission baseline In/Out Bed: Needs assistance Is this a change from baseline?: Pre-admission baseline Walks in Home: Needs assistance Is this a change from baseline?: Pre-admission baseline Does the patient have difficulty walking or climbing stairs?: Yes Weakness of Legs: Left Weakness of Arms/Hands: Left  Permission Sought/Granted                  Emotional Assessment       Orientation: : Oriented to Self, Oriented to Place, Oriented to  Time, Oriented to Situation Alcohol / Substance Use: Not Applicable Psych Involvement: No (comment)  Admission diagnosis:  NSTEMI (non-ST elevated myocardial infarction) [facility]) [I21.4] Patient Active Problem List   Diagnosis Date Noted   Urinary tract infection 11/08/2021   NSTEMI, Type ll (non-ST elevated myocardial infarction) (HCC) 11/07/2021   Acute on chronic diastolic CHF (congestive heart failure) (HCC) 11/07/2021   Hematuria 11/07/2021   Acute on chronic blood loss anemia 11/07/2021   Hemiparesis affecting left side as late effect of cerebrovascular accident (CVA) (HCC) 11/07/2021   Acute respiratory failure with hypoxia (HCC) 11/07/2021   AKI (acute kidney injury) (HCC) 08/21/2021   Acute kidney injury (HCC) 08/20/2021   GERD without esophagitis 08/20/2021   Basal ganglia  stroke (HCC) 08/12/2021   Type 2 diabetes mellitus with diabetic polyneuropathy, with long-term current use of insulin (HCC) 07/09/2021   Hypothyroidism 07/09/2021   Depression 10/21/2020   Non compliance w medication regimen 10/21/2020   Cocaine abuse (HCC) 09/05/2020    Adjustment disorder with emotional disturbance 09/05/2020   Essential hypertension 02/14/2011   Coronary artery disease involving native coronary artery of native heart without angina pectoris 08/13/2010   Stented coronary artery 08/13/2010   PCP:  Ellery Plunk, MD Pharmacy:  No Pharmacies Listed    Social Determinants of Health (SDOH) Interventions    Readmission Risk Interventions    11/08/2021   11:09 AM  Readmission Risk Prevention Plan  Transportation Screening Complete  Medication Review (RN Care Manager) Complete  PCP or Specialist appointment within 3-5 days of discharge Complete  SW Recovery Care/Counseling Consult Complete  Palliative Care Screening Not Applicable  Skilled Nursing Facility Complete

## 2021-11-08 NOTE — Progress Notes (Incomplete)
       CROSS COVER NOTE  NAME: Steve Alvarado MRN: [id number] DOB : September 07, 1955    Date of Service   11/08/21  HPI/Events of Note   Medication request received for sleep aid.  Interventions   Plan: Melatonin x1     This document was prepared using Dragon voice recognition software and may include unintentional dictation errors.  Bishop Limbo DNP, MHA, FNP-BC Nurse Practitioner [facility] Pager [phone]

## 2021-11-08 NOTE — Assessment & Plan Note (Addendum)
History of E. coli UTI June 2023 Urinalysis resulting postadmission, consistent with UTI Rocephin started Follow cultures

## 2021-11-08 NOTE — ED Notes (Signed)
Dr. Sidney Ace in to attempt ultrasound guided iv placement.

## 2021-11-08 NOTE — Plan of Care (Signed)
  Problem: Education: Goal: Ability to describe self-care measures that may prevent or decrease complications (Diabetes Survival Skills Education) will improve Outcome: Progressing Goal: Individualized Educational Video(s) Outcome: Progressing   Problem: Coping: Goal: Ability to adjust to condition or change in health will improve Outcome: Progressing   Problem: Fluid Volume: Goal: Ability to maintain a balanced intake and output will improve Outcome: Progressing   Problem: Health Behavior/Discharge Planning: Goal: Ability to identify and utilize available resources and services will improve Outcome: Progressing Goal: Ability to manage health-related needs will improve Outcome: Progressing   Problem: Metabolic: Goal: Ability to maintain appropriate glucose levels will improve Outcome: Progressing   Problem: Nutritional: Goal: Maintenance of adequate nutrition will improve Outcome: Progressing Goal: Progress toward achieving an optimal weight will improve Outcome: Progressing   Problem: Skin Integrity: Goal: Risk for impaired skin integrity will decrease Outcome: Progressing   Problem: Tissue Perfusion: Goal: Adequacy of tissue perfusion will improve Outcome: Progressing   Problem: Education: Goal: Understanding of cardiac disease, CV risk reduction, and recovery process will improve Outcome: Progressing Goal: Individualized Educational Video(s) Outcome: Progressing   Problem: Activity: Goal: Ability to tolerate increased activity will improve Outcome: Progressing   Problem: Cardiac: Goal: Ability to achieve and maintain adequate cardiovascular perfusion will improve Outcome: Progressing   Problem: Health Behavior/Discharge Planning: Goal: Ability to safely manage health-related needs after discharge will improve Outcome: Progressing   Problem: Education: Goal: Knowledge of General Education information will improve Description: Including pain rating scale,  medication(s)/side effects and non-pharmacologic comfort measures Outcome: Progressing   Problem: Health Behavior/Discharge Planning: Goal: Ability to manage health-related needs will improve Outcome: Progressing   Problem: Clinical Measurements: Goal: Ability to maintain clinical measurements within normal limits will improve Outcome: Progressing Goal: Will remain free from infection Outcome: Progressing Goal: Diagnostic test results will improve Outcome: Progressing Goal: Respiratory complications will improve Outcome: Progressing Goal: Cardiovascular complication will be avoided Outcome: Progressing   Problem: Activity: Goal: Risk for activity intolerance will decrease Outcome: Progressing   Problem: Nutrition: Goal: Adequate nutrition will be maintained Outcome: Progressing   Problem: Coping: Goal: Level of anxiety will decrease Outcome: Progressing   Problem: Elimination: Goal: Will not experience complications related to bowel motility Outcome: Progressing Goal: Will not experience complications related to urinary retention Outcome: Progressing   Problem: Pain Managment: Goal: General experience of comfort will improve Outcome: Progressing   Problem: Safety: Goal: Ability to remain free from injury will improve Outcome: Progressing   Problem: Skin Integrity: Goal: Risk for impaired skin integrity will decrease Outcome: Progressing   

## 2021-11-08 NOTE — Consult Note (Addendum)
   Heart Failure Nurse Navigator Note  HFpEF 55 to 60%.  Moderate LVH.  Grade 1 diastolic dysfunction.  Moderate mitral regurgitation.  Patient initially presented to the hospital with complaints of weakness and hematuria.  Waiting in triage seen noted chest pain and shortness of breath.  Chest x-ray revealed cardiomegaly and CHF and BNP of 464.  Comorbidities:  Coronary artery disease Diabetes type 2 Hyperlipidemia Hypertension Hypothyroidism History of cocaine use History of CVA  Medications:  Amlodipine 10 mg daily Aspirin 81 mg daily Atorvastatin 40 mg daily Carvedilol 3.125 mg 2 times a day with meals Furosemide 40 mg IV 2 times a day Levothyroxine 75 mcg daily Sartin 50 mg daily  Labs:  Sodium 140, potassium 3.2, chloride 104, CO2 28, BUN 22, creatinine 1.04, GFR greater than 60, hemoglobin 8.72 months ago reported at 11.9, hematocrit 26.2.  Magnesium 1.7. Weight is 92.5 kg Blood pressure 170/84 Intake 137 mL Output 1900 mL   Initial meeting with patient, he was lying quietly in bed in supine position and on room air.  He states that he is close to being back to normal and wondering when he is going to be discharged.  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  States that he lives at a skilled nursing facility and gets around by his wheelchair.  States that he has heard the term heart failure before and felt that it means that his heart is not working like it should.  Discussed the importance of daily weights.  He states in the 6 months that he has been at the nursing home that he is only been weighed twice.  Also discussed diet and the importance of low sodium, he states that he currently uses salt on his food.  And stressed the importance of not adding salt at the table.  Also stressed the importance of fluid restriction of no more than 64 ounces daily.  He states that he drinks a large container of sweet tea,  he has no idea how many  ounces it contains.  He also drinks 1 glass of water and a 12 ounce soda.  Asked when he is discharged to look at the bottle to see how many ounces that it contained trying to stick with no more than 64 ounces in a 24-hour period.  Explained his follow-up in the outpatient heart failure clinic on September 11 at 11 AM.  He had no further questions at this time. Is a 14% no-show which is 8 out of 57 appointments.  Was given the living with heart failure teaching booklet, zone magnet, info on low-sodium and heart failure along with weight chart.  Tresa Endo RN CHFN

## 2021-11-08 NOTE — Progress Notes (Signed)
PROGRESS NOTE    Steve Alvarado  CXK:[id number] DOB: 1955/09/11 DOA: 11/07/2021 PCP: Ellery Plunk, MD  Outpatient Specialists: cardiology    Brief Narrative:   From admission h and p Steve Alvarado is a 66 y.o. 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 coli UTI in June 2023 associated with AKI, currently resident of SNF, who presents to the ED initially for weakness and hematuria that started the day prior.  While awaiting triage he complained of chest pain and shortness of breath and was brought back to the ED.  He had no nausea, vomiting or diaphoresis. ED course and data review: BP 141/68 with pulse 77, O2 sat 76 on room air requiring 2 L to maintain sats in the mid to high 90s. Labs: Hemoglobin 8.8, down from 9.9 in June 2023.  Troponin 190-243 with BNP 464.  EKG, personally viewed and interpreted showed sinus at 83 with nonspecific ST-T wave changes.  Chest x-ray showed cardiomegaly with findings of CHF.  Pneumonia not excluded Patient treated with aspirin 324, Lasix 60 mg and started on a heparin bolus.  Hospitalist consulted for admission.     Assessment & Plan:   Principal Problem:   NSTEMI, Type ll (non-ST elevated myocardial infarction) (HCC) Active Problems:   Acute on chronic diastolic CHF (congestive heart failure) (HCC)   Hematuria   Acute on chronic blood loss anemia   Urinary tract infection   Acute respiratory failure with hypoxia (HCC)   Type 2 diabetes mellitus with diabetic polyneuropathy, with long-term current use of insulin (HCC)   Coronary artery disease involving native coronary artery of native heart without angina pectoris   Essential hypertension   Cocaine abuse (HCC)   Hypothyroidism   Hemiparesis affecting left side as late effect of  cerebrovascular accident (CVA) (HCC)  # Tropinemia # Chest pain No ischemic changes oon ekg. Trop 200s and flat. Chest pain resolved. Mildly volume overloaded. Cardiology consulted. Did receive aspirin - continue diuresis - f/u TTE - heparin deferred for the time being  # HFpEF with exacerbation Several days dyspnea. BNP elevated. CTA w/o signs PE but shows moderate pleural effusions. No fevers or cough or leukocytosis to suggest pna - continue diuresis - continue coreg - f/u tte  # Hematuria Patient doesn't endorse but says staff at SNF saw some drops of blood from penis when they changed him. Doesn't have foley, denies hx stones. No dysuria or abd pain. No rbcs seen on ua - f/u culture  # Anemia, normocytic Likely chronic disease given no documented hematuria, denies melena or other bleeding - check iron studies - trend  # Hx CVA Basal ganglia stroke 07/2021, has left hemiparesis - PT/OT if stays more than a day or so  # Cocaine abuse Says last use 3 years ago, denies other toxic habits  # HTN Here mild/mod bp elevation - resume home amlodipine, losartan  # CAD S/p BMS 2013. Tropinemia as above - continue aspirin, statin  # Chronic pain - cont home duloxetine, gabapentin  # T2DM Here glucose appropriate - semglee 10 for home lantus 16 - SSI  # Hypothyroid - home levo  DVT prophylaxis: SCDs for now Code Status: full Family Communication: wife updated telephonically 8/21  Level of care: Progressive Status is: Inpatient Remains inpatient appropriate because: severity of illness  Consultants:  cardiology  Procedures: none  Antimicrobials:  none    Subjective: This morning feels well, breathing comfortably, no chest pain, has appetite  Objective: Vitals:   11/08/21 0230 11/08/21 0330 11/08/21 0427 11/08/21 0722  BP: (!) 163/69 (!) 127/57 121/61 (!) 154/84  Pulse: 74 70 67 77  Resp: 13 14 19 17   Temp:   98 F (36.7 C) 98.2 F (36.8 C)   TempSrc:   Oral Oral  SpO2: 100% 100% 94% 96%  Weight:   92.5 kg   Height:   6\' 2"  (1.88 m)     Intake/Output Summary (Last 24 hours) at 11/08/2021 1108 Last data filed at 11/08/2021 1058 Gross per 24 hour  Intake 377.02 ml  Output 3950 ml  Net -3572.98 ml   Filed Weights   11/07/21 2209 11/08/21 0427  Weight: 99.8 kg 92.5 kg    Examination:  General exam: Appears calm and comfortable  Respiratory system: Clear to auscultation save for rales at bases Cardiovascular system: S1 & S2 heard, RRR. No JVD, murmurs, rubs, gallops or clicks.  Gastrointestinal system: Abdomen is nondistended, soft and nontender. No organomegaly or masses felt. Normal bowel sounds heard. Central nervous system: weakness left side Extremities: Symmetric 5 x 5 power. Trace pedal edema Skin: No rashes, lesions or ulcers Psychiatry: Judgement and insight appear normal. Mood & affect appropriate.     Data Reviewed: I have personally reviewed following labs and imaging studies  CBC: Recent Labs  Lab 11/07/21 1442 11/08/21 0050 11/08/21 0443  WBC 5.9  --  5.3  HGB 8.8* 8.2* 8.7*  HCT 26.8*  --  26.2*  MCV 86.2  --  85.6  PLT 232  --  218   Basic Metabolic Panel: Recent Labs  Lab 11/07/21 1442 11/08/21 0443  NA 138 140  K 3.4* 3.2*  CL 104 104  CO2 26 28  GLUCOSE 139* 110*  BUN 24* 22  CREATININE 1.14 1.04  CALCIUM 8.9 8.9   GFR: Estimated Creatinine Clearance: 80.1 mL/min (by C-G formula based on SCr of 1.04 mg/dL). Liver Function Tests: No results for input(s): "AST", "ALT", "ALKPHOS", "BILITOT", "PROT", "ALBUMIN" in the last 168 hours. No results for input(s): "LIPASE", "AMYLASE" in the last 168 hours. No results for input(s): "AMMONIA" in the last 168 hours. Coagulation Profile: Recent Labs  Lab 11/07/21 2115  INR 1.0   Cardiac Enzymes: No results for input(s): "CKTOTAL", "CKMB", "CKMBINDEX", "TROPONINI" in the last 168 hours. BNP (last 3 results) No results for input(s):  "PROBNP" in the last 8760 hours. HbA1C: No results for input(s): "HGBA1C" in the last 72 hours. CBG: Recent Labs  Lab 11/07/21 2240 11/07/21 2334 11/08/21 0907  GLUCAP 100* 100* 117*   Lipid Profile: No results for input(s): "CHOL", "HDL", "LDLCALC", "TRIG", "CHOLHDL", "LDLDIRECT" in the last 72 hours. Thyroid Function Tests: No results for input(s): "TSH", "T4TOTAL", "FREET4", "T3FREE", "THYROIDAB" in the last 72 hours. Anemia Panel: No results for input(s): "VITAMINB12", "FOLATE", "FERRITIN", "TIBC", "IRON", "RETICCTPCT" in the last 72 hours. Urine analysis:    Component Value Date/Time   COLORURINE YELLOW (A) 11/08/2021 0100   APPEARANCEUR HAZY (A) 11/08/2021 0100   APPEARANCEUR Clear 08/22/2013 0057   LABSPEC 1.010 11/08/2021 0100   LABSPEC 1.017 08/22/2013 0057   PHURINE 5.0 11/08/2021 0100   GLUCOSEU NEGATIVE 11/08/2021 0100   GLUCOSEU >=500 08/22/2013 0057   HGBUR SMALL (A) 11/08/2021 0100   BILIRUBINUR NEGATIVE 11/08/2021 0100   BILIRUBINUR Negative 08/22/2013 0057   KETONESUR NEGATIVE 11/08/2021  0100   PROTEINUR 100 (A) 11/08/2021 0100   NITRITE NEGATIVE 11/08/2021 0100   LEUKOCYTESUR MODERATE (A) 11/08/2021 0100   LEUKOCYTESUR Negative 08/22/2013 0057   Sepsis Labs: @LABRCNTIP (procalcitonin:4,lacticidven:4)  )No results found for this or any previous visit (from the past 240 hour(s)).       Radiology Studies: CT Angio Chest Pulmonary Embolism (PE) W or WO Contrast  Result Date: 11/08/2021 CLINICAL DATA:  Concern for pulmonary embolism. EXAM: CT ANGIOGRAPHY CHEST WITH CONTRAST TECHNIQUE: Multidetector CT imaging of the chest was performed using the standard protocol during bolus administration of intravenous contrast. Multiplanar CT image reconstructions and MIPs were obtained to evaluate the vascular anatomy. RADIATION DOSE REDUCTION: This exam was performed according to the departmental dose-optimization program which includes automated exposure control,  adjustment of the mA and/or kV according to patient size and/or use of iterative reconstruction technique. CONTRAST:  76mL OMNIPAQUE IOHEXOL 350 MG/ML SOLN COMPARISON:  Chest radiograph dated 11/07/2021 and CT dated 07/02/2021. FINDINGS: Cardiovascular: There is mild cardiomegaly. No pericardial effusion. Three-vessel coronary vascular calcification. Mild atherosclerotic calcification of the thoracic aorta. Evaluation of the pulmonary arteries is limited due to respiratory motion. No pulmonary artery embolus identified. Mediastinum/Nodes: Mildly enlarged hilar lymph nodes, likely reactive. The esophagus is grossly unremarkable. No mediastinal fluid collection. Lungs/Pleura: Moderate bilateral pleural effusions, new since the prior CT. There is partial compressive atelectasis of the lower lobes versus pneumonia. Diffuse interstitial prominence consistent with edema. There is no pneumothorax. The central airways are patent. Upper Abdomen: No acute abnormality. Musculoskeletal: No acute osseous pathology. Review of the MIP images confirms the above findings. IMPRESSION: 1. No CT evidence of pulmonary artery embolus. 2. Cardiomegaly with findings of CHF and moderate bilateral pleural effusions. 3. Partial compressive atelectasis of the lower lobes versus pneumonia. 4. Aortic Atherosclerosis (ICD10-I70.0). Electronically Signed   By: 07/04/2021 M.D.   On: 11/08/2021 01:21   DG Chest Port 1 View  Result Date: 11/07/2021 CLINICAL DATA:  Chest pain and fever. EXAM: PORTABLE CHEST 1 VIEW COMPARISON:  Chest radiograph dated 08/20/2021. FINDINGS: Cardiomegaly with vascular congestion and edema. Pneumonia is not excluded. Trace left pleural effusion may be present. No pneumothorax. No acute osseous pathology. IMPRESSION: Cardiomegaly with findings of CHF. Pneumonia is not excluded. Electronically Signed   By: 10/20/2021 M.D.   On: 11/07/2021 19:43        Scheduled Meds:  aspirin EC  81 mg Oral Daily    atorvastatin  40 mg Oral Daily   carvedilol  3.125 mg Oral BID WC   citalopram  20 mg Oral Daily   enoxaparin (LOVENOX) injection  40 mg Subcutaneous Q24H   furosemide  40 mg Intravenous BID   insulin aspart  0-15 Units Subcutaneous TID WC   insulin aspart  0-5 Units Subcutaneous QHS   insulin glargine-yfgn  10 Units Subcutaneous QHS   levothyroxine  75 mcg Oral Q0600   losartan  50 mg Oral Daily   pantoprazole  40 mg Oral q morning   potassium chloride  60 mEq Oral Once   Continuous Infusions:  cefTRIAXone (ROCEPHIN)  IV Stopped (11/08/21 0312)     LOS: 1 day     11/10/21, MD [facility]   If 7PM-7AM, please contact night-coverage [email or web address] Password [email or web address] 11/08/2021, 11:08 AM

## 2021-11-08 NOTE — Consult Note (Signed)
Cardiology Consult    Patient ID: Steve Alvarado MRN: [id number], DOB/AGE: 1955-11-18   Admit date: 11/07/2021 Date of Consult: 11/08/2021  Primary Physician: Steve Callander, MD Primary Cardiologist: Steve Rogue, MD Requesting Provider: Dr. Damita Alvarado  Patient Profile    Steve Alvarado is a 66 y.o. male with a history of CAD s/p stenting to mid RCA in 2012, HTN, HLD, Basal Ganglia stoke in May 2023, DM2, and hypothyroidism, who is being seen today for the evaluation of chest pain at the request of Steve Alvarado.  Past Medical History   Past Medical History:  Diagnosis Date   Basal ganglia stroke ([place])    a. 07/2021 L sided wkns/facial droop/fall-->MRI brain: nonhemorrhagic infarct of posterior R lentiform nucleus and corona radiata measure. Remote lacunar infarcts of post cerebellum bilat and L thalamus.   CKD (chronic kidney disease), stage II    a. 08/2021 AKI w/ creat up to 3.4.   Cocaine use    Coronary artery disease    a. 07/2010 PCI: LAD 86m, LCX 80 diff/small, OM1 44m, RCA 44m (3.0x18 Vision BMS). EF 50%; b. 07/2013 Cath: LM nl, LAD 30p, 20d, LCX 20d, LCX 100, OM1 60, RCA 30p, 7m, RPDA 90-->Med Rx; c. 10/2020 MV: EF 46% (60-65% by echo), no ischemia. 3V cor Ca2+.   Diabetes mellitus    Type II   Diastolic dysfunction    a. 10/2020 Echo: EF 60-65%, no rwma, mild LVH, GrI DD, nl RV size/fxn; b. 07/2021 Echo: EF 60-65%, no rwma, GrI DD, nl RV fxn, mildly dil LA, triv MR; c. 10/2021 Echo: EF 55-60%, no rwma, GrI DD, nl RV fxn, mild-mod MR, Asc Ao 18mm.   Hyperlipidemia    Hypertension    Hypothyroidism     Past Surgical History:  Procedure Laterality Date   CARDIAC CATHETERIZATION  08/06/2010   Bare metal stent placed in RCA.   HAND SURGERY     left     Allergies  Allergies  Allergen Reactions   Ace Inhibitors Cough    Reaction not listed on MAR    History of Present Illness    Steve Alvarado is a 66 year old male with a past medical history of CAD s/p stenting  to RCA in 2012, HTN, HLD, Basal Ganglia stoke in May 2023, DM2, and hypothyroidism.  He was last seen in October 2014 by Steve Alvarado for CAD follow up s/p stenting to the mid RCA  after hospitalization for chest pain in May 2012.  At the time he was on Amlodipine, Losartan, Metoprolol, Imdur, Aspirin and Plavix. He was then lost to follow up.   In August of 2022, he presented to the ED for chest pain and dyspnea.  His troponins at the time were mildly elevated with peak of 27.  His creatinine at the time was 1.57.  He was positive for cocaine on this visit.  He underwent a stress test which showed no evidence of ischemia.  He was started on aspirin, coreg, lipitor, and imdur with recommended outpatient follow up.    He presented to the ED in April of this year for chest pain which was nonspecific.  His blood pressure at the time was 213/105.  It was noted he had a fall a few weeks prior where he hit his shoulder and chest.  Troponins were negative.  His EKG showed 1st degree AV block, unchanged from prior. He had not taken his blood pressure medications in a few days so he was restarted  on Carvedilol and Imdur and discharged.   In May 2023, he was admitted for a basal ganglia stoke after presenting for acute left sided weakness, facial droop and fall.   He was discharged to a SNF on aspirin and plavix for 3 weeks then only aspirin.  He was also restarted on his coreg, amlodipine, and losartan.  It was also recommended for outpatient cardiac monitoring to rule out afib.  He subsequently was hospitalized for an AKI a few days after discharge on 08/20/21.  He is baseline in a wheelchair with lift assistance but he noted feeling weaker starting 8/18.  On 8/19, he believes that staff noted hematuria (he can't see his urine in the commode), and on 8/20, he was brought to the ED where in the waiting room he experienced 1 episode of chest pain/tightness associated with dyspnea that resolved after 10 minutes w/o  intervention.  His troponins were elevated at 190  243  245.  Chest CT r/o PE but showed moderate b/l pleural effusions and cardiomegaly suggestive of CHF.  BNP was 464.5.  He was given 60mg  IV lasix and was started on a heparin infusion that was later stopped due to decreasing hemoglobin/hematuria.  He's responded well to IV lasix and is minus 3.5 L this AM.  Limited echo this AM shows EF of 55-60% w/o rwma.  He denies chest pain or dyspnea this AM and is in no distress.  He notes that he cont to have L sided wkns since his stroke in May, though he is able to move his L leg some, but not enough to bear weight.  Inpatient Medications     amLODipine  10 mg Oral q morning   aspirin EC  81 mg Oral Daily   atorvastatin  40 mg Oral Daily   carvedilol  3.125 mg Oral BID WC   citalopram  20 mg Oral Daily   DULoxetine  20 mg Oral BID   enoxaparin (LOVENOX) injection  40 mg Subcutaneous Q24H   furosemide  40 mg Intravenous BID   gabapentin  400 mg Oral TID   insulin aspart  0-15 Units Subcutaneous TID WC   insulin aspart  0-5 Units Subcutaneous QHS   insulin glargine-yfgn  10 Units Subcutaneous QHS   levothyroxine  75 mcg Oral Q0600   losartan  50 mg Oral Daily   pantoprazole  40 mg Oral q morning   potassium chloride  60 mEq Oral Once    Family History    Family History  Problem Relation Age of Onset   Heart attack Mother    He indicated that his mother is deceased. He indicated that his father is deceased.   Social History    Social History   Socioeconomic History   Marital status: Legally Separated    Spouse name: Not on file   Number of children: Not on file   Years of education: Not on file   Highest education level: Not on file  Occupational History   Not on file  Tobacco Use   Smoking status: Never   Smokeless tobacco: Never  Vaping Use   Vaping Use: Never used  Substance and Sexual Activity   Alcohol use: No   Drug use: Yes    Comment: crack   Sexual activity: Not  on file  Other Topics Concern   Not on file  Social History Narrative   Not on file   Social Determinants of Health   Financial Resource Strain: Not on file  Food Insecurity: Not on file  Transportation Needs: Not on file  Physical Activity: Not on file  Stress: Not on file  Social Connections: Not on file  Intimate Partner Violence: Not on file     Review of Systems    General:  No chills, fever, night sweats or weight changes.  +++ fatigue beginning ~ 8/18. Cardiovascular:  +++ chest pain, +++ dyspnea, no edema, orthopnea, palpitations, paroxysmal nocturnal dyspnea. Dermatological: No rash, lesions/masses Respiratory: No cough, +++ dyspnea on day of admission. Urologic: +++ hematuria, no dysuria Abdominal:   No nausea, vomiting, diarrhea, bright red blood per rectum, melena, or hematemesis Neurologic:  No visual changes, wkns, changes in mental status. All other systems reviewed and are otherwise negative except as noted above.  Physical Exam    Blood pressure (!) 156/79, pulse 73, temperature 98.4 F (36.9 C), temperature source Oral, resp. rate 18, height 6\' 2"  (1.88 m), weight 92.5 kg, SpO2 96 %.  General: Pleasant, NAD Psych: Normal affect. Neuro: Alert and oriented X 3. Left sided hemiparesis. HEENT: Normal  Neck: Supple without bruits or JVD. Lungs:  Resp regular and unlabored, diminished at bases to ausculation. Heart: RRR no s3, s4, or murmurs. Abdomen: Soft, non-tender, non-distended, BS + x 4.  Extremities: No clubbing, cyanosis or edema. DP/PT2+, Radials 2+ and equal bilaterally.  Labs    Cardiac Enzymes Recent Labs  Lab 11/07/21 1442 11/07/21 2115 11/08/21 0050  TROPONINIHS 190* 243* 245*     BNP    Component Value Date/Time   BNP 464.5 (H) 11/07/2021 1442    Lab Results  Component Value Date   WBC 5.3 11/08/2021   HGB 8.7 (L) 11/08/2021   HCT 26.2 (L) 11/08/2021   MCV 85.6 11/08/2021   PLT 218 11/08/2021    Recent Labs  Lab  11/08/21 0443  NA 140  K 3.2*  CL 104  CO2 28  BUN 22  CREATININE 1.04  CALCIUM 8.9  GLUCOSE 110*   Lab Results  Component Value Date   CHOL 146 08/13/2021   HDL 66 08/13/2021   LDLCALC 60 08/13/2021   TRIG 99 08/13/2021   Lab Results  Component Value Date   DDIMER 1.26 (H) 11/07/2021     Radiology Studies    CT Angio Chest Pulmonary Embolism (PE) W or WO Contrast  Result Date: 11/08/2021 CLINICAL DATA:  Concern for pulmonary embolism. EXAM: CT ANGIOGRAPHY CHEST WITH CONTRAST TECHNIQUE: Multidetector CT imaging of the chest was performed using the standard protocol during bolus administration of intravenous contrast. Multiplanar CT image reconstructions and MIPs were obtained to evaluate the vascular anatomy. RADIATION DOSE REDUCTION: This exam was performed according to the departmental dose-optimization program which includes automated exposure control, adjustment of the mA and/or kV according to patient size and/or use of iterative reconstruction technique. CONTRAST:  76mL OMNIPAQUE IOHEXOL 350 MG/ML SOLN COMPARISON:  Chest radiograph dated 11/07/2021 and CT dated 07/02/2021. FINDINGS: Cardiovascular: There is mild cardiomegaly. No pericardial effusion. Three-vessel coronary vascular calcification. Mild atherosclerotic calcification of the thoracic aorta. Evaluation of the pulmonary arteries is limited due to respiratory motion. No pulmonary artery embolus identified. Mediastinum/Nodes: Mildly enlarged hilar lymph nodes, likely reactive. The esophagus is grossly unremarkable. No mediastinal fluid collection. Lungs/Pleura: Moderate bilateral pleural effusions, new since the prior CT. There is partial compressive atelectasis of the lower lobes versus pneumonia. Diffuse interstitial prominence consistent with edema. There is no pneumothorax. The central airways are patent. Upper Abdomen: No acute abnormality. Musculoskeletal: No acute osseous pathology. Review  of the MIP images confirms  the above findings. IMPRESSION: 1. No CT evidence of pulmonary artery embolus. 2. Cardiomegaly with findings of CHF and moderate bilateral pleural effusions. 3. Partial compressive atelectasis of the lower lobes versus pneumonia. 4. Aortic Atherosclerosis (ICD10-I70.0). Electronically Signed   By: Anner Crete M.D.   On: 11/08/2021 01:21   DG Chest Port 1 View  Result Date: 11/07/2021 CLINICAL DATA:  Chest pain and fever. EXAM: PORTABLE CHEST 1 VIEW COMPARISON:  Chest radiograph dated 08/20/2021. FINDINGS: Cardiomegaly with vascular congestion and edema. Pneumonia is not excluded. Trace left pleural effusion may be present. No pneumothorax. No acute osseous pathology. IMPRESSION: Cardiomegaly with findings of CHF. Pneumonia is not excluded. Electronically Signed   By: Anner Crete M.D.   On: 11/07/2021 19:43    ECG & Cardiac Imaging    Sinus with 1st degree AV block, occasional PVCs, rate of 70-80's - personally reviewed.  Assessment & Plan    1.  CAD s/p stenting/Demand Ischemia/Chest pain: s/p PCI to mid RCA in May 2012.  Lexiscan Myoview in 10/2021 showed no ischemia.  Presented 2/2 malaise and hematuria but developed c/p and dyspnea in the ED x 10 mins, that resolved spontaneously.  No further c/p.  Troponins elevated at 190 243  245.  In the setting of hemoglobin of 8.8 8.2, likely demand ischemia.  Limited echo this AM shows nl EF @ 55-60% w/o regional wall motion abnormalities.  Continue aspirin 81 mg daily and Lipitor 40 mg daily. Holding Plavix (recent CVA) in setting of hematuria.  Can consider outpt ischemic evaluation once anemia/hematuria stabilized.  2.  Acute HFpEF/Shortness of breath: Patient experienced one episode of dyspnea related to chest pain, resolved with supplemental oxygen.  Currently on room air with no additional complaints of dyspnea.  BNP elevated to 464.5. Patient given 60 mg IV lasix in the ED.  Output 3.5 L in the past 24 hours.  Creatinine stable at 1.04 this  morning 8/21.  He remains diminished at bases but overall appears euvolemic.  Echo this AM w/ EF 55-60% w/o rwma, and w/ mild-mod MR.  Reasonable to cont IV lasix today and transition to PO tomorrow.  He was not prev on lasix @ home.  Cont I&Os and daily weights.  3.  Hypertension: BP elevated to 154/84 this morning 8/21. Restarted Coreg 3.125 mg BID and Losartan 50 mg Daily.  Follow.  Also on amlodipine @ home.  4.  UTI/Hematuria: Voiding clear, yellow urine this morning 8/21. Antibiotics per IM.  Monitor creatinine/H&H.  5.  Hypokalemia: Potassium down from 3.4 3.2 this morning 8/21.  Replete/ Trend with goal of 4.  6.  Hyperlipidemia: LDL 60 on 08/13/21.  At goal. Continue Lipitor 40 mg daily.  7.  DM2: AlC 7.9 on 08/13/21.  Management per IM.  8.  Hypothyroidism: TSH 6.004 on 08/06/20.  Management per IM. Continue synthroid.   Risk Assessment/Risk Scores:     HEAR Score (for undifferentiated chest pain):     New York Heart Association (NYHA) Functional Class NYHA Class II   Signed, Murray Hodgkins, NP 11/08/2021, 12:28 PM  For questions or updates, please contact   Please consult [email or web address] for contact info under Cardiology/STEMI.

## 2021-11-09 DIAGNOSIS — I2489 Other forms of acute ischemic heart disease: Secondary | ICD-10-CM

## 2021-11-09 DIAGNOSIS — I5033 Acute on chronic diastolic (congestive) heart failure: Secondary | ICD-10-CM

## 2021-11-09 DIAGNOSIS — I248 Other forms of acute ischemic heart disease: Secondary | ICD-10-CM

## 2021-11-09 LAB — CBC
HCT: 24 % — ABNORMAL LOW (ref 39.0–52.0)
Hemoglobin: 8.2 g/dL — ABNORMAL LOW (ref 13.0–17.0)
MCH: 29 pg (ref 26.0–34.0)
MCHC: 34.2 g/dL (ref 30.0–36.0)
MCV: 84.8 fL (ref 80.0–100.0)
Platelets: 210 10*3/uL (ref 150–400)
RBC: 2.83 MIL/uL — ABNORMAL LOW (ref 4.22–5.81)
RDW: 13 % (ref 11.5–15.5)
WBC: 5.5 10*3/uL (ref 4.0–10.5)
nRBC: 0 % (ref 0.0–0.2)

## 2021-11-09 LAB — GLUCOSE, CAPILLARY
Glucose-Capillary: 100 mg/dL — ABNORMAL HIGH (ref 70–99)
Glucose-Capillary: 238 mg/dL — ABNORMAL HIGH (ref 70–99)
Glucose-Capillary: 158 mg/dL — ABNORMAL HIGH (ref 70–99)

## 2021-11-09 LAB — BASIC METABOLIC PANEL
Anion gap: 12 (ref 5–15)
BUN: 27 mg/dL — ABNORMAL HIGH (ref 8–23)
CO2: 25 mmol/L (ref 22–32)
Calcium: 8.6 mg/dL — ABNORMAL LOW (ref 8.9–10.3)
Chloride: 101 mmol/L (ref 98–111)
Creatinine, Ser: 1.33 mg/dL — ABNORMAL HIGH (ref 0.61–1.24)
GFR, Estimated: 59 mL/min — ABNORMAL LOW (ref >60)
Glucose, Bld: 92 mg/dL (ref 70–99)
Potassium: 3.5 mmol/L (ref 3.5–5.1)
Sodium: 138 mmol/L (ref 135–145)

## 2021-11-09 LAB — LIPOPROTEIN A (LPA): Lipoprotein (a): 167.2 nmol/L — ABNORMAL HIGH (ref <75.0)

## 2021-11-09 MED ORDER — FERROUS SULFATE 325 (65 FE) MG PO TBEC: 325.0000 mg | DELAYED_RELEASE_TABLET | ORAL | 3 refills | Status: DC

## 2021-11-09 MED ORDER — FUROSEMIDE 20 MG PO TABS: 20.0000 mg | ORAL_TABLET | Freq: Every day | ORAL | 11 refills | Status: DC

## 2021-11-09 MED ORDER — POTASSIUM CHLORIDE CRYS ER 10 MEQ PO TBCR: 10.0000 meq | EXTENDED_RELEASE_TABLET | Freq: Two times a day (BID) | ORAL | Status: DC

## 2021-11-09 NOTE — Progress Notes (Addendum)
   Heart Failure Nurse Navigator Note  Met with patient this morning, there were no family members at the bedside.  Patient states he had read through some of his heart failure materials that he was given yesterday.  He had good questions about what caused the thickening the pumping chambers of his heart.  Went on to further explain that is why is important to keep blood pressures in check and watching the amount of salt/sodium and fluids being as active as you can.  Voices understanding.  He had no further questions but I told him if he or his family had further questions I would gladly come back.  Pricilla Riffle RN [facility]

## 2021-11-09 NOTE — Progress Notes (Signed)
Patient discharged back to SNF, report called to Nurse Alcario Drought.  Patient transported by EMS discharge patient sent with EMS staff. Family aware of D/C plan.  Faatima Tench, Kae Heller, RN

## 2021-11-09 NOTE — Progress Notes (Signed)
Cardiology Progress Note   Patient Name: Steve Alvarado Date of Encounter: 11/09/2021  Primary Cardiologist: Julien Nordmann, MD  Subjective   No chest pain or sob.  Eager to go home.  Inpatient Medications    Scheduled Meds:  amLODipine  10 mg Oral q morning   aspirin EC  81 mg Oral Daily   atorvastatin  40 mg Oral Daily   carvedilol  6.25 mg Oral BID WC   citalopram  20 mg Oral Daily   DULoxetine  20 mg Oral BID   enoxaparin (LOVENOX) injection  40 mg Subcutaneous Q24H   gabapentin  400 mg Oral TID   insulin aspart  0-15 Units Subcutaneous TID WC   insulin aspart  0-5 Units Subcutaneous QHS   insulin glargine-yfgn  10 Units Subcutaneous QHS   levothyroxine  75 mcg Oral Q0600   losartan  50 mg Oral Daily   pantoprazole  40 mg Oral q morning   Continuous Infusions:  PRN Meds: acetaminophen, nitroGLYCERIN, ondansetron (ZOFRAN) IV, mouth rinse   Vital Signs    Vitals:   11/08/21 2346 11/09/21 0415 11/09/21 0424 11/09/21 1106  BP: 126/74 (!) 151/71  134/64  Pulse: 70 71  72  Resp: 18 14  16   Temp: 98.7 F (37.1 C) 98.9 F (37.2 C)  98.3 F (36.8 C)  TempSrc:  Oral    SpO2: 93% 100%  100%  Weight:   91.4 kg   Height:        Intake/Output Summary (Last 24 hours) at 11/09/2021 1241 Last data filed at 11/08/2021 2100 Gross per 24 hour  Intake 480 ml  Output 2300 ml  Net -1820 ml   Filed Weights   11/07/21 2209 11/08/21 0427 11/09/21 0424  Weight: 99.8 kg 92.5 kg 91.4 kg    Physical Exam   GEN: Well nourished, well developed, in no acute distress.  HEENT: Grossly normal.  Neck: Supple, no JVD, carotid bruits, or masses. Cardiac: RRR, no murmurs, rubs, or gallops. No clubbing, cyanosis, edema.  Radials 2+, DP/PT 2+ and equal bilaterally.  Respiratory:  Respirations regular and unlabored, clear to auscultation bilaterally. GI: Soft, nontender, nondistended, BS + x 4. MS: no deformity or atrophy.  L sided wkns. Skin: warm and dry, no rash. Neuro:  L  hemiparesis. Psych: AAOx3.  Normal affect.  Labs    Chemistry Recent Labs  Lab 11/07/21 1442 11/08/21 0443 11/09/21 0449  NA 138 140 138  K 3.4* 3.2* 3.5  CL 104 104 101  CO2 26 28 25   GLUCOSE 139* 110* 92  BUN 24* 22 27*  CREATININE 1.14 1.04 1.33*  CALCIUM 8.9 8.9 8.6*  GFRNONAA >60 >60 59*  ANIONGAP 8 8 12      Hematology Recent Labs  Lab 11/07/21 1442 11/08/21 0050 11/08/21 0443 11/09/21 0449  WBC 5.9  --  5.3 5.5  RBC 3.11*  --  3.06* 2.83*  HGB 8.8* 8.2* 8.7* 8.2*  HCT 26.8*  --  26.2* 24.0*  MCV 86.2  --  85.6 84.8  MCH 28.3  --  28.4 29.0  MCHC 32.8  --  33.2 34.2  RDW 13.1  --  13.2 13.0  PLT 232  --  218 210    Cardiac Enzymes  Recent Labs  Lab 11/07/21 1442 11/07/21 2115 11/08/21 0050  TROPONINIHS 190* 243* 245*      BNP    Component Value Date/Time   BNP 464.5 (H) 11/07/2021 1442     DDimer  Recent  Labs  Lab 11/07/21 2115  DDIMER 1.26*     Lipids  Lab Results  Component Value Date   CHOL 146 08/13/2021   HDL 66 08/13/2021   LDLCALC 60 08/13/2021   TRIG 99 08/13/2021   CHOLHDL 2.2 08/13/2021    HbA1c  Lab Results  Component Value Date   HGBA1C 7.9 (H) 08/13/2021    Radiology    CT Angio Chest Pulmonary Embolism (PE) W or WO Contrast  Result Date: 11/08/2021 CLINICAL DATA:  Concern for pulmonary embolism. EXAM: CT ANGIOGRAPHY CHEST WITH CONTRAST TECHNIQUE: Multidetector CT imaging of the chest was performed using the standard protocol during bolus administration of intravenous contrast. Multiplanar CT image reconstructions and MIPs were obtained to evaluate the vascular anatomy. RADIATION DOSE REDUCTION: This exam was performed according to the departmental dose-optimization program which includes automated exposure control, adjustment of the mA and/or kV according to patient size and/or use of iterative reconstruction technique. CONTRAST:  4mL OMNIPAQUE IOHEXOL 350 MG/ML SOLN COMPARISON:  Chest radiograph dated 11/07/2021  and CT dated 07/02/2021. FINDINGS: Cardiovascular: There is mild cardiomegaly. No pericardial effusion. Three-vessel coronary vascular calcification. Mild atherosclerotic calcification of the thoracic aorta. Evaluation of the pulmonary arteries is limited due to respiratory motion. No pulmonary artery embolus identified. Mediastinum/Nodes: Mildly enlarged hilar lymph nodes, likely reactive. The esophagus is grossly unremarkable. No mediastinal fluid collection. Lungs/Pleura: Moderate bilateral pleural effusions, new since the prior CT. There is partial compressive atelectasis of the lower lobes versus pneumonia. Diffuse interstitial prominence consistent with edema. There is no pneumothorax. The central airways are patent. Upper Abdomen: No acute abnormality. Musculoskeletal: No acute osseous pathology. Review of the MIP images confirms the above findings. IMPRESSION: 1. No CT evidence of pulmonary artery embolus. 2. Cardiomegaly with findings of CHF and moderate bilateral pleural effusions. 3. Partial compressive atelectasis of the lower lobes versus pneumonia. 4. Aortic Atherosclerosis (ICD10-I70.0). Electronically Signed   By: Elgie Collard M.D.   On: 11/08/2021 01:21   DG Chest Port 1 View  Result Date: 11/07/2021 CLINICAL DATA:  Chest pain and fever. EXAM: PORTABLE CHEST 1 VIEW COMPARISON:  Chest radiograph dated 08/20/2021. FINDINGS: Cardiomegaly with vascular congestion and edema. Pneumonia is not excluded. Trace left pleural effusion may be present. No pneumothorax. No acute osseous pathology. IMPRESSION: Cardiomegaly with findings of CHF. Pneumonia is not excluded. Electronically Signed   By: Elgie Collard M.D.   On: 11/07/2021 19:43    Telemetry    RSR, PACs - Personally Reviewed  Cardiac Studies   2D Echocardiogram 8.21.2023  1. Left ventricular ejection fraction, by estimation, is 55 to 60%. The  left ventricle has normal function. The left ventricle has no regional  wall motion  abnormalities. There is moderate left ventricular hypertrophy.  Left ventricular diastolic  parameters are consistent with Grade I diastolic dysfunction (impaired  relaxation).   2. Right ventricular systolic function is normal. The right ventricular  size is normal. Mildly increased right ventricular wall thickness.   3. The mitral valve is degenerative. Mild to moderate mitral valve  regurgitation. No evidence of mitral stenosis.   4. The aortic valve is tricuspid. Aortic valve regurgitation is not  visualized. No aortic stenosis is present.   5. There is borderline dilatation of the ascending aorta, measuring 37  mm.   6. The inferior vena cava is normal in size with greater than 50%  respiratory variability, suggesting right atrial pressure of 3 mmHg.   Patient Profile     66 y.o. male  with a history of CAD s/p stenting to mid RCA in 2012, HTN, HLD, Basal Ganglia stoke in May 2023, DM2, and hypothyroidism, who was admitted 8/20 w/ hematuria and developed c/p and dyspnea while in the ED.  HsTrop elevated to 245.  Assessment & Plan    1.  CAD/Demand Ischemia/chest pain:  s/p PCI to mid RCA in May 2012.  Lexiscan Myoview in 10/2021 showed no ischemia.  Presented 2/2 malaise and hematuria but developed c/p and dyspnea in the ED x 10 mins, that resolved spontaneously.  No further c/p.  Troponins elevated at 190 243  245.  In the setting of hemoglobin of 8.8 8.2, likely demand ischemia.  Limited echo this admission w/ EF 55-60% and /wo rwma.  No recurrent symptoms.  Cont asa, ? blockern, and statin rx.  Will consider outpt myoview following resolution of hematuria and improvement in anemia.  2.  Acute HFpEF:  BNP 464.5 on admission w/ episode of dyspnea in ED.  Responded well to IV lasix, minus 6.2 L since admission.  Creat up slightly this AM @ 1.33.  Hold any further IV lasix.  Suspect he would benefit from lasix 40 PO daily at home.  BP has been variable.  Coreg increased to 6.25mg  BID  yesterday.  Cont amlodipine and losartan.  3.  HTN:  BP variable, but better currently.  Cont current meds.  4.  Hematuria:  Reportedly seen @ SNF.  received 1 dose of ceftriaxone in ED.  No further abx per IM.  H/H relatively stable, though sl lower this AM @ 8.2/24.0.  Urine tea colored this AM.  Home dose of plavix on hold (recently started following cva).  Per IM.  5.  Hypokalemia:  K 3.5 this AM.  Will likely need low dose supplementation at d/c.  6.  HL:  LDL 60 in May.  Cont statin.  7.  DMII:  A1c 7.9 in May.  Per IM.  Signed, Murray Hodgkins, NP  11/09/2021, 12:41 PM    For questions or updates, please contact   Please consult [email or web address] for contact info under Cardiology/STEMI.

## 2021-11-09 NOTE — TOC Progression Note (Signed)
Transition of Care [facility]) - Progression Note    Patient Details  Name: Steve Alvarado MRN: [id number] Date of Birth: 1955-07-15  [facility] CM/SW Contact  Margarito Liner, LCSW Phone Number: 11/09/2021, 1:07 PM  Clinical Narrative:  Left message for [facility] admissions coordinator to see if patient can return today.   Expected Discharge Plan: Skilled Nursing Facility Barriers to Discharge: Continued Medical Work up  Expected Discharge Plan and Services Expected Discharge Plan: Skilled Nursing Facility       Living arrangements for the past 2 months: Skilled Nursing Facility                                       Social Determinants of Health (SDOH) Interventions    Readmission Risk Interventions    11/08/2021   11:09 AM  Readmission Risk Prevention Plan  Transportation Screening Complete  Medication Review (RN Care Manager) Complete  PCP or Specialist appointment within 3-5 days of discharge Complete  SW Recovery Care/Counseling Consult Complete  Palliative Care Screening Not Applicable  Skilled Nursing Facility Complete

## 2021-11-09 NOTE — Discharge Summary (Signed)
Steve Alvarado [id number] DOB: October 15, 1955 DOA: 11/07/2021  PCP: Kingsley Callander, MD  Admit date: 11/07/2021 Discharge date: 11/09/2021  Time spent: 40 minutes  Recommendations for Outpatient Follow-up:  Pcp f/u 1-2 weeks, check hemoglobin, kidney function, and potassium at that visit F/u cardiology    Discharge Diagnoses:  Principal Problem:   NSTEMI, Type ll (non-ST elevated myocardial infarction) ([place]) Active Problems:   Acute on chronic diastolic CHF (congestive heart failure) (HCC)   Hematuria   Acute on chronic blood loss anemia   Urinary tract infection   Acute respiratory failure with hypoxia (HCC)   Type 2 diabetes mellitus with diabetic polyneuropathy, with long-term current use of insulin ([place])   Coronary artery disease involving native coronary artery of native heart without angina pectoris   Essential hypertension   Cocaine abuse ([place])   Hypothyroidism   Hemiparesis affecting left side as late effect of cerebrovascular accident (CVA) ([place])   Discharge Condition: stable  Diet recommendation: heart healthy  Filed Weights   11/07/21 2209 11/08/21 0427 11/09/21 0424  Weight: 99.8 kg 92.5 kg 91.4 kg    History of present illness:  From admission h and p Steve Alvarado is a 66 y.o. 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 coli UTI in June 2023 associated with AKI, currently resident of SNF, who presents to the ED initially for weakness and hematuria that started the day prior.  While awaiting triage he complained of chest pain and shortness of breath and was brought back to the ED.  He had no nausea, vomiting or diaphoresis.  Hospital Course:   # Tropinemia # Chest pain No ischemic changes oon ekg. Trop 200s and flat. Chest pain resolved. Mildly  volume overloaded. Cardiology consulted. Did receive aspirin. Cardiology advised no further w/u inpatient, rather outpt f/u and consider myoview then.    # HFpEF with exacerbation Several days dyspnea. BNP elevated. CTA w/o signs PE but shows moderate pleural effusions. No fevers or cough or leukocytosis to suggest pna. Diuresesd and dyspnea resolved. TTE w/ diastolic dysfunction, mild/mod MR - low dose lasix and K supplement started, will need close cardiology f/u   # Hematuria? # Anemia Patient doesn't endorse but says staff at SNF saw some drops of blood from penis when they changed him. Doesn't have foley, denies hx stones. No dysuria or abd pain. No rbcs seen on ua, no gross hematuria seen here. No melena or hematochezia - f/u culture   # Anemia, normocytic Likely chronic disease given no documented hematuria, denies melena or other bleeding. Ferritin normal but iron low and los sat. Patient says he thinks he had a normal EGD at [facility] last year. Is on aspirin/plavix - iron supplement started - monitor anemia at pcp f/u. Consider GI referral   # Hx CVA Basal ganglia stroke 07/2021, has left hemiparesis - home aspirin/statin/plavix continued   # Cocaine abuse Says last use 3 years ago, denies other toxic habits   # HTN Here mild/mod bp elevation - resume home amlodipine, losartan   # CAD S/p BMS 2013. Tropinemia as above - continue aspirin, statin   # Chronic pain - cont home duloxetine, gabapentin   # T2DM Here glucose appropriate - semglee 10 for home lantus 16 - SSI   # Hypothyroid - home levo  Procedures: none   Consultations:  cardiology  Discharge Exam: Vitals:   11/09/21 0415 11/09/21 1106  BP: (!) 151/71 134/64  Pulse: 71 72  Resp: 14 16  Temp: 98.9 F (37.2 C) 98.3 F (36.8 C)  SpO2: 100% 100%    General exam: Appears calm and comfortable  Respiratory system: Clear to auscultation save for rales at bases Cardiovascular system: S1 & S2  heard, RRR. No JVD, murmurs, rubs, gallops or clicks.  Gastrointestinal system: Abdomen is nondistended, soft and nontender. No organomegaly or masses felt. Normal bowel sounds heard. Central nervous system: weakness left side Extremities: Symmetric 5 x 5 power. Trace pedal edema Skin: No rashes, lesions or ulcers Psychiatry: Judgement and insight appear normal. Mood & affect appropriate.   Discharge Instructions   Discharge Instructions     Diet - low sodium heart healthy   Complete by: As directed    Increase activity slowly   Complete by: As directed       Allergies as of 11/09/2021       Reactions   Ace Inhibitors Cough   Reaction not listed on MAR        Medication List     STOP taking these medications    cephALEXin 500 MG capsule Commonly known as: KEFLEX   insulin glargine-yfgn 100 UNIT/ML injection Commonly known as: SEMGLEE   metFORMIN 1000 MG tablet Commonly known as: GLUCOPHAGE       TAKE these medications    amLODipine 10 MG tablet Commonly known as: NORVASC Take 1 tablet (10 mg total) by mouth every morning.   lidocaine 4 % Place 1 patch onto the skin daily. Apply one patch topically to left abdomen every morning - leave on for 12 hours. 12 hours on and 12 hours off.   Aspercreme Lidocaine 4 % Generic drug: lidocaine 1 patch See admin instructions. Apply 1 patch topically daily in the morning   aspirin EC 81 MG tablet Take 1 tablet (81 mg total) by mouth daily. Swallow whole.   atorvastatin 40 MG tablet Commonly known as: LIPITOR Take 1 tablet (40 mg total) by mouth every evening. What changed: when to take this   carvedilol 3.125 MG tablet Commonly known as: COREG Take 1 tablet (3.125 mg total) by mouth 2 (two) times daily with a meal. What changed: when to take this   citalopram 20 MG tablet Commonly known as: CELEXA Take 1 tablet (20 mg total) by mouth daily.   clopidogrel 75 MG tablet Commonly known as: PLAVIX Take 1 tablet  (75 mg total) by mouth daily. What changed: when to take this   DULoxetine 20 MG capsule Commonly known as: CYMBALTA Take 20 mg by mouth 2 (two) times daily.   ferrous sulfate 325 (65 FE) MG EC tablet Take 1 tablet (325 mg total) by mouth every other day.   gabapentin 400 MG capsule Commonly known as: NEURONTIN Take 400 mg by mouth 3 (three) times daily. 8am, 12pm 4pm   HumaLOG 100 UNIT/ML injection Generic drug: insulin lispro Inject 0.03 mLs (3 Units total) into the skin 3 (three) times daily with meals.   Lantus SoloStar 100 UNIT/ML Solostar Pen Generic drug: insulin glargine Inject 16 Units into the skin at bedtime.   levothyroxine 75 MCG tablet Commonly known as: SYNTHROID Take 75 mcg by mouth at bedtime. 2200   losartan 50 MG tablet Commonly known as: COZAAR Take 50 mg by mouth daily.   methocarbamol 500 MG tablet Commonly known as: ROBAXIN Take 500 mg by mouth 2 (two)  times daily.   pantoprazole 40 MG tablet Commonly known as: PROTONIX Take 40 mg by mouth every morning.   polyethylene glycol 17 g packet Commonly known as: MIRALAX / GLYCOLAX Take 17 g by mouth daily as needed for mild constipation.       Allergies  Allergen Reactions   Ace Inhibitors Cough    Reaction not listed on MAR    Contact information for follow-up providers     [name], Gladstone Pih, MD Follow up.   Specialty: Family Medicine Contact information: [address] [id number] [facility] [place] [address] [phone]         Minna Merritts, MD .   Specialty: Cardiology Contact information: [place] [address] [phone]              Contact information for after-discharge care     Destination     [facility] .   Service: Skilled Nursing Contact information: [address] [place] [phone]                      The results of  significant diagnostics from this hospitalization (including imaging, microbiology, ancillary and laboratory) are listed below for reference.    Significant Diagnostic Studies: ECHOCARDIOGRAM LIMITED  Result Date: 11/08/2021    ECHOCARDIOGRAM LIMITED REPORT   Patient Name:   DENA CAMMAROTA Date of Exam: 11/08/2021 Medical Rec #:  [id number]         Height:       74.0 in Accession #:    [id number]        Weight:       203.9 lb Date of Birth:  05-22-1955         BSA:          2.191 m Patient Age:    33 years          BP:           154/84 mmHg Patient Gender: M                 HR:           81 bpm. Exam Location:  [facility] Procedure: Cardiac Doppler, 2D Echo and Color Doppler Indications:     R07.9 Chest pain  History:         Patient has prior history of Echocardiogram examinations, most                  recent 08/12/2021. CHF, CAD, Coronary stent; Risk                  Factors:Hypertension, Diabetes and Dyslipidemia.  Sonographer:     Rosalia Hammers Referring Phys:  [id number] Clance Boll BERGE Diagnosing Phys: Nelva Bush MD IMPRESSIONS  1. Left ventricular ejection fraction, by estimation, is 55 to 60%. The left ventricle has normal function. The left ventricle has no regional wall motion abnormalities. There is moderate left ventricular hypertrophy. Left ventricular diastolic parameters are consistent with Grade I diastolic dysfunction (impaired relaxation).  2. Right ventricular systolic function is normal. The right ventricular size is normal. Mildly increased right ventricular wall thickness.  3. The mitral valve is degenerative. Mild to moderate mitral valve regurgitation. No evidence of mitral stenosis.  4. The aortic valve is tricuspid. Aortic valve regurgitation is not visualized. No aortic stenosis is present.  5. There is borderline dilatation of the ascending aorta,  measuring 37 mm.  6. The inferior vena cava is normal in size with greater than 50% respiratory variability, suggesting right atrial  pressure of 3 mmHg. FINDINGS  Left Ventricle: Left ventricular ejection fraction, by estimation, is 55 to 60%. The left ventricle has normal function. The left ventricle has no regional wall motion abnormalities. The left ventricular internal cavity size was normal in size. There is  moderate left ventricular hypertrophy. Left ventricular diastolic parameters are consistent with Grade I diastolic dysfunction (impaired relaxation). Right Ventricle: The right ventricular size is normal. Mildly increased right ventricular wall thickness. Right ventricular systolic function is normal. Left Atrium: Left atrial size was normal in size. Right Atrium: Right atrial size was normal in size. Pericardium: There is no evidence of pericardial effusion. Mitral Valve: The mitral valve is degenerative in appearance. There is mild thickening of the mitral valve leaflet(s). Mild to moderate mitral valve regurgitation. No evidence of mitral valve stenosis. Tricuspid Valve: The tricuspid valve is grossly normal. Aortic Valve: The aortic valve is tricuspid. Aortic valve regurgitation is not visualized. No aortic stenosis is present. Aortic valve mean gradient measures 3.0 mmHg. Aortic valve peak gradient measures 4.8 mmHg. Aortic valve area, by VTI measures 2.75 cm. Pulmonic Valve: The pulmonic valve was not well visualized. Pulmonic valve regurgitation is not visualized. No evidence of pulmonic stenosis. Aorta: The aortic root is normal in size and structure. There is borderline dilatation of the ascending aorta, measuring 37 mm. Pulmonary Artery: The pulmonary artery is of normal size. Venous: The inferior vena cava is normal in size with greater than 50% respiratory variability, suggesting right atrial pressure of 3 mmHg. IAS/Shunts: The interatrial septum was not well visualized. LEFT VENTRICLE PLAX 2D LVIDd:         4.17 cm   Diastology LVIDs:         2.84 cm   LV e' medial:    7.29 cm/s LV PW:         1.53 cm   LV E/e' medial:   11.9 LV IVS:        1.38 cm   LV e' lateral:   10.70 cm/s LVOT diam:     1.90 cm   LV E/e' lateral: 8.1 LV SV:         53 LV SV Index:   24 LVOT Area:     2.84 cm  RIGHT VENTRICLE RV Basal diam:  3.60 cm RV S prime:     12.90 cm/s TAPSE (M-mode): 2.6 cm LEFT ATRIUM             Index        RIGHT ATRIUM           Index LA diam:        3.50 cm 1.60 cm/m   RA Area:     12.40 cm LA Vol (A2C):   79.4 ml 36.23 ml/m  RA Volume:   24.80 ml  11.32 ml/m LA Vol (A4C):   31.1 ml 14.19 ml/m LA Biplane Vol: 55.3 ml 25.23 ml/m  AORTIC VALVE AV Area (Vmax):    2.43 cm AV Area (Vmean):   2.55 cm AV Area (VTI):     2.75 cm AV Vmax:           109.00 cm/s AV Vmean:          76.700 cm/s AV VTI:            0.194 m AV Peak Grad:  4.8 mmHg AV Mean Grad:      3.0 mmHg LVOT Vmax:         93.30 cm/s LVOT Vmean:        68.900 cm/s LVOT VTI:          0.188 m LVOT/AV VTI ratio: 0.97  AORTA Ao Root diam: 3.20 cm MITRAL VALVE MV Area (PHT): 2.95 cm    SHUNTS MV Decel Time: 257 msec    Systemic VTI:  0.19 m MV E velocity: 87.00 cm/s  Systemic Diam: 1.90 cm MV A velocity: 99.80 cm/s MV E/A ratio:  0.87 Cristal Deer End MD Electronically signed by Yvonne Kendall MD Signature Date/Time: 11/08/2021/11:50:42 AM    Final    CT Angio Chest Pulmonary Embolism (PE) W or WO Contrast  Result Date: 11/08/2021 CLINICAL DATA:  Concern for pulmonary embolism. EXAM: CT ANGIOGRAPHY CHEST WITH CONTRAST TECHNIQUE: Multidetector CT imaging of the chest was performed using the standard protocol during bolus administration of intravenous contrast. Multiplanar CT image reconstructions and MIPs were obtained to evaluate the vascular anatomy. RADIATION DOSE REDUCTION: This exam was performed according to the departmental dose-optimization program which includes automated exposure control, adjustment of the mA and/or kV according to patient size and/or use of iterative reconstruction technique. CONTRAST:  96mL OMNIPAQUE IOHEXOL 350 MG/ML SOLN COMPARISON:   Chest radiograph dated 11/07/2021 and CT dated 07/02/2021. FINDINGS: Cardiovascular: There is mild cardiomegaly. No pericardial effusion. Three-vessel coronary vascular calcification. Mild atherosclerotic calcification of the thoracic aorta. Evaluation of the pulmonary arteries is limited due to respiratory motion. No pulmonary artery embolus identified. Mediastinum/Nodes: Mildly enlarged hilar lymph nodes, likely reactive. The esophagus is grossly unremarkable. No mediastinal fluid collection. Lungs/Pleura: Moderate bilateral pleural effusions, new since the prior CT. There is partial compressive atelectasis of the lower lobes versus pneumonia. Diffuse interstitial prominence consistent with edema. There is no pneumothorax. The central airways are patent. Upper Abdomen: No acute abnormality. Musculoskeletal: No acute osseous pathology. Review of the MIP images confirms the above findings. IMPRESSION: 1. No CT evidence of pulmonary artery embolus. 2. Cardiomegaly with findings of CHF and moderate bilateral pleural effusions. 3. Partial compressive atelectasis of the lower lobes versus pneumonia. 4. Aortic Atherosclerosis (ICD10-I70.0). Electronically Signed   By: Elgie Collard M.D.   On: 11/08/2021 01:21   DG Chest Port 1 View  Result Date: 11/07/2021 CLINICAL DATA:  Chest pain and fever. EXAM: PORTABLE CHEST 1 VIEW COMPARISON:  Chest radiograph dated 08/20/2021. FINDINGS: Cardiomegaly with vascular congestion and edema. Pneumonia is not excluded. Trace left pleural effusion may be present. No pneumothorax. No acute osseous pathology. IMPRESSION: Cardiomegaly with findings of CHF. Pneumonia is not excluded. Electronically Signed   By: Elgie Collard M.D.   On: 11/07/2021 19:43    Microbiology: Recent Results (from the past 240 hour(s))  Urine Culture     Status: Abnormal (Preliminary result)   Collection Time: 11/08/21  1:00 AM   Specimen: Urine, Clean Catch  Result Value Ref Range Status   Specimen  Description   Final    URINE, CLEAN CATCH Performed at [facility], [address] [place], [place] [address]    Special Requests   Final    NONE Performed at [facility], [address] [place], [place] [address]    Culture >=100,000 COLONIES/mL GRAM NEGATIVE RODS (A)  Final   Report Status PENDING  Incomplete     Labs: Basic Metabolic Panel: Recent Labs  Lab 11/07/21 1442 11/08/21 0050 11/08/21 0443 11/09/21 0449  NA 138  --  140 138  K 3.4*  --  3.2* 3.5  CL 104  --  104 101  CO2 26  --  28 25  GLUCOSE 139*  --  110* 92  BUN 24*  --  22 27*  CREATININE 1.14  --  1.04 1.33*  CALCIUM 8.9  --  8.9 8.6*  MG  --  1.7  --   --    Liver Function Tests: No results for input(s): "AST", "ALT", "ALKPHOS", "BILITOT", "PROT", "ALBUMIN" in the last 168 hours. No results for input(s): "LIPASE", "AMYLASE" in the last 168 hours. No results for input(s): "AMMONIA" in the last 168 hours. CBC: Recent Labs  Lab 11/07/21 1442 11/08/21 0050 11/08/21 0443 11/09/21 0449  WBC 5.9  --  5.3 5.5  HGB 8.8* 8.2* 8.7* 8.2*  HCT 26.8*  --  26.2* 24.0*  MCV 86.2  --  85.6 84.8  PLT 232  --  218 210   Cardiac Enzymes: No results for input(s): "CKTOTAL", "CKMB", "CKMBINDEX", "TROPONINI" in the last 168 hours. BNP: BNP (last 3 results) Recent Labs    11/07/21 1442  BNP 464.5*    ProBNP (last 3 results) No results for input(s): "PROBNP" in the last 8760 hours.  CBG: Recent Labs  Lab 11/08/21 1311 11/08/21 1716 11/08/21 2022 11/09/21 0712 11/09/21 1118  GLUCAP 135* 97 95 100* 238*       Signed:  Silvano Bilis MD.  [facility] 11/09/2021, 1:52 PM

## 2021-11-09 NOTE — TOC Transition Note (Signed)
Transition of Care [facility]) - CM/SW Discharge Note   Patient Details  Name: Steve Alvarado MRN: [id number] Date of Birth: Sep 07, 1955  [facility]) CM/SW Contact:  Margarito Liner, LCSW Phone Number: 11/09/2021, 2:52 PM   Clinical Narrative:   Patient has orders to discharge to [facility] SNF today. RN will call report to [phone] (Room 329A). EMS transport has been arranged and he is next on the list. No further concerns. CSW signing off.  Final next level of care: Skilled Nursing Facility Barriers to Discharge: Barriers Resolved   Patient Goals and CMS Choice     Choice offered to / list presented to : Patient  Discharge Placement   Existing PASRR number confirmed : 11/08/21          Patient chooses bed at: [facility] Patient to be transferred to facility by: EMS Name of family member notified: Patient said his wife is already aware. Patient and family notified of of transfer: 11/09/21  Discharge Plan and Services                                     Social Determinants of Health (SDOH) Interventions     Readmission Risk Interventions    11/08/2021   11:09 AM  Readmission Risk Prevention Plan  Transportation Screening Complete  Medication Review (RN Care Manager) Complete  PCP or Specialist appointment within 3-5 days of discharge Complete  SW Recovery Care/Counseling Consult Complete  Palliative Care Screening Not Applicable  Skilled Nursing Facility Complete

## 2021-11-09 NOTE — Plan of Care (Signed)
  Problem: Education: Goal: Ability to describe self-care measures that may prevent or decrease complications (Diabetes Survival Skills Education) will improve Outcome: Adequate for Discharge Goal: Individualized Educational Video(s) Outcome: Adequate for Discharge   Problem: Coping: Goal: Ability to adjust to condition or change in health will improve Outcome: Adequate for Discharge   Problem: Fluid Volume: Goal: Ability to maintain a balanced intake and output will improve Outcome: Adequate for Discharge   Problem: Health Behavior/Discharge Planning: Goal: Ability to identify and utilize available resources and services will improve Outcome: Adequate for Discharge Goal: Ability to manage health-related needs will improve Outcome: Adequate for Discharge   Problem: Metabolic: Goal: Ability to maintain appropriate glucose levels will improve Outcome: Adequate for Discharge   Problem: Nutritional: Goal: Maintenance of adequate nutrition will improve Outcome: Adequate for Discharge Goal: Progress toward achieving an optimal weight will improve Outcome: Adequate for Discharge   Problem: Nutritional: Goal: Progress toward achieving an optimal weight will improve Outcome: Adequate for Discharge   Problem: Nutritional: Goal: Progress toward achieving an optimal weight will improve Outcome: Adequate for Discharge   Problem: Skin Integrity: Goal: Risk for impaired skin integrity will decrease Outcome: Adequate for Discharge   Problem: Tissue Perfusion: Goal: Adequacy of tissue perfusion will improve Outcome: Adequate for Discharge   Problem: Education: Goal: Understanding of cardiac disease, CV risk reduction, and recovery process will improve Outcome: Adequate for Discharge Goal: Individualized Educational Video(s) Outcome: Adequate for Discharge   Problem: Activity: Goal: Ability to tolerate increased activity will improve Outcome: Adequate for Discharge   Problem:  Cardiac: Goal: Ability to achieve and maintain adequate cardiovascular perfusion will improve Outcome: Adequate for Discharge   Problem: Health Behavior/Discharge Planning: Goal: Ability to safely manage health-related needs after discharge will improve Outcome: Adequate for Discharge   Problem: Education: Goal: Knowledge of General Education information will improve Description: Including pain rating scale, medication(s)/side effects and non-pharmacologic comfort measures Outcome: Adequate for Discharge   Problem: Clinical Measurements: Goal: Ability to maintain clinical measurements within normal limits will improve Outcome: Adequate for Discharge Goal: Will remain free from infection Outcome: Adequate for Discharge Goal: Diagnostic test results will improve Outcome: Adequate for Discharge Goal: Respiratory complications will improve Outcome: Adequate for Discharge Goal: Cardiovascular complication will be avoided Outcome: Adequate for Discharge   Problem: Activity: Goal: Risk for activity intolerance will decrease Outcome: Adequate for Discharge   Problem: Nutrition: Goal: Adequate nutrition will be maintained Outcome: Adequate for Discharge   Problem: Coping: Goal: Level of anxiety will decrease Outcome: Adequate for Discharge   Problem: Elimination: Goal: Will not experience complications related to bowel motility Outcome: Adequate for Discharge Goal: Will not experience complications related to urinary retention Outcome: Adequate for Discharge   Problem: Pain Managment: Goal: General experience of comfort will improve Outcome: Adequate for Discharge   Problem: Safety: Goal: Ability to remain free from injury will improve Outcome: Adequate for Discharge   Problem: Skin Integrity: Goal: Risk for impaired skin integrity will decrease Outcome: Adequate for Discharge

## 2021-11-11 ENCOUNTER — Telehealth: Payer: Self-pay | Admitting: Obstetrics and Gynecology

## 2021-11-11 LAB — URINE CULTURE: Culture: >=100000 — AB

## 2021-11-11 NOTE — Telephone Encounter (Signed)
Urine culture positive. Called patient, he endorses dysuria. No constitutional symptoms of flank pain to suggest pyelo. Discussed w/ nurse at snf, she took a verbal order for 7 days bactrim to treat this.

## 2021-11-23 NOTE — Patient Outreach (Signed)
  Care Coordination   11/23/2021 Name: LEALON VANPUTTEN MRN: [id number] DOB: 08-27-1955    Telephone outreach to patient to obtain mRS was successfully completed. MRS= 5  [facility] Care Management Assistant [phone]

## 2021-11-29 ENCOUNTER — Ambulatory Visit: Payer: Medicare Other | Attending: Family | Admitting: Family

## 2021-11-29 ENCOUNTER — Encounter: Payer: Self-pay | Admitting: Family

## 2021-11-29 VITALS — BP 145/79 | HR 71 | Resp 18 | Ht 74.0 in

## 2021-11-29 DIAGNOSIS — Z955 Presence of coronary angioplasty implant and graft: Secondary | ICD-10-CM | POA: Diagnosis not present

## 2021-11-29 DIAGNOSIS — R531 Weakness: Secondary | ICD-10-CM | POA: Diagnosis not present

## 2021-11-29 DIAGNOSIS — E785 Hyperlipidemia, unspecified: Secondary | ICD-10-CM | POA: Diagnosis not present

## 2021-11-29 DIAGNOSIS — I10 Essential (primary) hypertension: Secondary | ICD-10-CM | POA: Diagnosis not present

## 2021-11-29 DIAGNOSIS — I130 Hypertensive heart and chronic kidney disease with heart failure and stage 1 through stage 4 chronic kidney disease, or unspecified chronic kidney disease: Secondary | ICD-10-CM | POA: Diagnosis present

## 2021-11-29 DIAGNOSIS — I2510 Atherosclerotic heart disease of native coronary artery without angina pectoris: Secondary | ICD-10-CM

## 2021-11-29 DIAGNOSIS — F32A Depression, unspecified: Secondary | ICD-10-CM | POA: Insufficient documentation

## 2021-11-29 DIAGNOSIS — I5032 Chronic diastolic (congestive) heart failure: Secondary | ICD-10-CM

## 2021-11-29 DIAGNOSIS — Z7982 Long term (current) use of aspirin: Secondary | ICD-10-CM | POA: Diagnosis not present

## 2021-11-29 DIAGNOSIS — E1142 Type 2 diabetes mellitus with diabetic polyneuropathy: Secondary | ICD-10-CM

## 2021-11-29 DIAGNOSIS — Z8673 Personal history of transient ischemic attack (TIA), and cerebral infarction without residual deficits: Secondary | ICD-10-CM | POA: Diagnosis not present

## 2021-11-29 DIAGNOSIS — I252 Old myocardial infarction: Secondary | ICD-10-CM | POA: Diagnosis not present

## 2021-11-29 DIAGNOSIS — E1122 Type 2 diabetes mellitus with diabetic chronic kidney disease: Secondary | ICD-10-CM | POA: Diagnosis not present

## 2021-11-29 DIAGNOSIS — Z794 Long term (current) use of insulin: Secondary | ICD-10-CM

## 2021-11-29 DIAGNOSIS — F1490 Cocaine use, unspecified, uncomplicated: Secondary | ICD-10-CM | POA: Diagnosis not present

## 2021-11-29 DIAGNOSIS — I6381 Other cerebral infarction due to occlusion or stenosis of small artery: Secondary | ICD-10-CM

## 2021-11-29 MED ORDER — SACUBITRIL-VALSARTAN 24-26 MG PO TABS: 1.0000 | ORAL_TABLET | Freq: Two times a day (BID) | ORAL | 3 refills | Status: AC

## 2021-11-29 NOTE — Patient Instructions (Addendum)
Continue weighing daily and call for an overnight weight gain of 3 pounds or more or a weekly weight gain of more than 5 pounds.   Please weigh patient daily and keep log.   Stop Losartan and begin Entresto twice daily, one pill in the morning and one in the evening.   Continue low-sodium diet and no adding salt.

## 2021-11-29 NOTE — Progress Notes (Signed)
Patient ID: Steve Alvarado, male    DOB: Oct 01, 1955, 66 y.o.   MRN: [id number]  HPI  Steve Alvarado is a 66 y/o male with a history of CAD (previous stents), DM, hyperlipidemia, HTN, CKD, stroke, thyroid disease, depression, previous cocaine use and chronic heart failure.   ECHO report on 11/08/21 reviewed and showed Left ventricular ejection fraction of 55 to 60% along with moderate LVH  Had cath done 07/04/13 but unable to view results  Admitted 11/10/21 for NSTEMI. CTA negative for PE. Started on low dose furosemide/potassium.      Cardiology consult obtained. Discharged after 2 days. Was in the ED 09/27/21 due to left hip pain where he was evaluation and released.   Steve Alvarado presents as a new patient with a chief complaint of right hand pain. Denies fatigue, visual disturbances, headaches, dizziness, cough, SOB, chest pain/pressure, palpitations, abdominal pain/distention, lower extremity edema, constipation nor diarrhea. Says he occasionally has lower leg edema but has not in a while. 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 He states he sleeps fine and takes tramadol. He follows a low-sodium diet and blood sugars are taken three times daily at facility.   Past Medical History:  Diagnosis Date   Basal ganglia stroke (HCC)    a. 07/2021 L sided wkns/facial droop/fall-->MRI brain: nonhemorrhagic infarct of posterior R lentiform nucleus and corona radiata measure. Remote lacunar infarcts of post cerebellum bilat and L thalamus.   CKD (chronic kidney disease), stage II    a. 08/2021 AKI w/ creat up to 3.4.   Cocaine use    Coronary artery disease    a. 07/2010 PCI: LAD 53m, LCX 80 diff/small, OM1 73m, RCA 22m (3.0x18 [facility] BMS). EF 50%; b. 07/2013 Cath: LM nl, LAD 30p, 20d, LCX 20d, LCX 100, OM1 60, RCA 30p, 66m, RPDA 90-->Med Rx; c. 10/2020 MV: EF 46% (60-65% by echo), no ischemia. 3V cor Ca2+.   Diabetes mellitus    Type II   Diastolic dysfunction    a. 10/2020 Echo: EF 60-65%, no rwma, mild  LVH, GrI DD, nl RV size/fxn; b. 07/2021 Echo: EF 60-65%, no rwma, GrI DD, nl RV fxn, mildly dil LA, triv Steve; c. 10/2021 Echo: EF 55-60%, no rwma, GrI DD, nl RV fxn, mild-mod Steve, Asc Ao 24mm.   Hyperlipidemia    Hypertension    Hypothyroidism    Past Surgical History:  Procedure Laterality Date   CARDIAC CATHETERIZATION  08/06/2010   Bare metal stent placed in RCA.   HAND SURGERY     left   Family History  Problem Relation Age of Onset   Heart attack Mother    Social History   Tobacco Use   Smoking status: Never   Smokeless tobacco: Never  Substance Use Topics   Alcohol use: No   Allergies  Allergen Reactions   Ace Inhibitors Cough    Reaction not listed on MAR   Prior to Admission medications   Medication Sig Start Date End Date Taking? Authorizing Provider  amLODipine (NORVASC) 10 MG tablet Take 1 tablet (10 mg total) by mouth every morning. 08/19/21   de Saintclair Halsted, Cortney E, NP  ASPERCREME LIDOCAINE 4 % 1 patch See admin instructions. Apply 1 patch topically daily in the morning Patient not taking: Reported on 11/08/2021 07/12/21   [provider]  aspirin EC 81 MG tablet Take 1 tablet (81 mg total) by mouth daily. Swallow whole. 08/18/21   Elmer Picker, NP  atorvastatin (LIPITOR) 40 MG  tablet Take 1 tablet (40 mg total) by mouth every evening. Patient taking differently: Take 40 mg by mouth daily. 10/23/20 08/20/21  Max Sane, MD  carvedilol (COREG) 3.125 MG tablet Take 1 tablet (3.125 mg total) by mouth 2 (two) times daily with a meal. Patient taking differently: Take 3.125 mg by mouth in the morning and at bedtime. 10/23/20 11/08/21  Max Sane, MD  citalopram (CELEXA) 20 MG tablet Take 1 tablet (20 mg total) by mouth daily. 10/23/20 11/08/21  Max Sane, MD  clopidogrel (PLAVIX) 75 MG tablet Take 1 tablet (75 mg total) by mouth daily. Patient taking differently: Take 75 mg by mouth at bedtime. 08/18/21   Janine Ores, NP  DULoxetine (CYMBALTA) 20 MG capsule Take 20 mg by  mouth 2 (two) times daily. 11/04/21   [provider]  ferrous sulfate 325 (65 FE) MG EC tablet Take 1 tablet (325 mg total) by mouth every other day. 11/09/21 11/09/22  Wouk, Ailene Rud, MD  furosemide (LASIX) 20 MG tablet Take 1 tablet (20 mg total) by mouth daily. 11/09/21 11/09/22  Wouk, Ailene Rud, MD  gabapentin (NEURONTIN) 400 MG capsule Take 400 mg by mouth 3 (three) times daily. 8am, 12pm 4pm 06/03/21   [provider]  insulin lispro (HUMALOG) 100 UNIT/ML injection Inject 0.03 mLs (3 Units total) into the skin 3 (three) times daily with meals. 10/23/20   Max Sane, MD  LANTUS SOLOSTAR 100 UNIT/ML Solostar Pen Inject 16 Units into the skin at bedtime. 09/14/21   [provider]  levothyroxine (SYNTHROID) 75 MCG tablet Take 75 mcg by mouth at bedtime. 2200 05/19/21   [provider]  lidocaine 4 % Place 1 patch onto the skin daily. Apply one patch topically to left abdomen every morning - leave on for 12 hours. 12 hours on and 12 hours off.    [provider]  losartan (COZAAR) 50 MG tablet Take 50 mg by mouth daily. 05/19/21   [provider]  methocarbamol (ROBAXIN) 500 MG tablet Take 500 mg by mouth 2 (two) times daily. 11/04/21   [provider]  pantoprazole (PROTONIX) 40 MG tablet Take 40 mg by mouth every morning. 05/19/21   [provider]  polyethylene glycol (MIRALAX / GLYCOLAX) 17 g packet Take 17 g by mouth daily as needed for mild constipation. 08/22/21   Terrilee Croak, MD  potassium chloride (KLOR-CON M) 10 MEQ tablet Take 1 tablet (10 mEq total) by mouth 2 (two) times daily. 11/09/21   Wouk, Ailene Rud, MD     Review of Systems  Constitutional:  Negative for appetite change, fatigue and fever.  HENT:  Negative for congestion, postnasal drip and sore throat.   Eyes:  Negative for visual disturbance.  Respiratory:  Negative for chest tightness, shortness of breath and wheezing.   Cardiovascular:  Negative for chest  pain, palpitations and leg swelling.  Gastrointestinal:  Negative for abdominal pain, constipation, diarrhea, nausea and vomiting.  Endocrine: Negative.   Genitourinary:  Negative for difficulty urinating.  Musculoskeletal:  Positive for arthralgias (right hand).  Skin: Negative.   Allergic/Immunologic: Negative.   Neurological:  Positive for tremors (sometimes in the hands). Negative for dizziness and headaches.  Hematological:  Negative for adenopathy. Does not bruise/bleed easily.  Psychiatric/Behavioral:  Negative for dysphoric mood and sleep disturbance. The patient is not nervous/anxious.    Vitals:   11/29/21 1059  BP: (!) 145/79  Pulse: 71  Resp: 18  SpO2: 100%   Lab Results  Component  Value Date   CREATININE 1.33 (H) 11/09/2021   CREATININE 1.04 11/08/2021   CREATININE 1.14 11/07/2021    Physical Exam Vitals and nursing note reviewed. Exam conducted with a chaperone present (SNF staff member).  Constitutional:      Appearance: Normal appearance.  HENT:     Head: Normocephalic and atraumatic.  Cardiovascular:     Rate and Rhythm: Normal rate and regular rhythm.     Heart sounds: Normal heart sounds.  Pulmonary:     Effort: Pulmonary effort is normal.     Breath sounds: Normal breath sounds. No wheezing or rales.  Abdominal:     General: There is no distension.     Palpations: Abdomen is soft.     Tenderness: There is no abdominal tenderness.  Musculoskeletal:        General: No tenderness.     Cervical back: Neck supple. No tenderness.     Right lower leg: Edema (trace pitting) present.     Left lower leg: Edema (trace pitting) present.  Skin:    General: Skin is warm and dry.  Neurological:     Mental Status: He is alert and oriented to person, place, and time. Mental status is at baseline.     Comments: Left-sided weakness  Psychiatric:        Mood and Affect: Mood normal.        Behavior: Behavior normal.        Thought Content: Thought content normal.         Judgment: Judgment normal.    Assessment and Plan:  1: Chronic heart failure with preserved ejection fraction with structural changes (LAE)- - NYHA class I - He follows a low-sodium diet and does not add salt to foods - Facility not weighing him daily as they will need an order for this and he could not weigh today in office due to being wheelchair bound, he was weighed last week and says it was 207 ish at the facility - order written for daily weight and to call for an overnight weight gain of > 2 pounds or a weekly weight gain of > 5 pounds - Begin Entresto today and stop the losartan - check BMP next visit - Will consider Farxiga at next visit in three weeks although does have recent history of UTI - Orders put in for diabetic socks to be worn daily - BNP 11/10/21 464.5  2: HTN- - BP mildly elevated (145/79) has taken medications today - seeing PCP at facility - [facility] 11/09/21 reviewed and showed sodium 138, potassium 3.5 creatinine 1.33 and GFR 59  3: CAD- - currently on lipitor and aspirin  - to see cardiology ([place]) 12/23/21  4: DM- - A1C 7.9 08/13/21 - Blood sugars are checked three times daily at facility   5: Stroke- - complete left sided weakness due to stroke   Medication list reviewed but facility to send medication list today for full verification of med list.   Return in 3 weeks

## 2021-12-07 ENCOUNTER — Emergency Department: Payer: Medicare Other

## 2021-12-07 ENCOUNTER — Emergency Department
Admission: EM | Admit: 2021-12-07 | Discharge: 2021-12-07 | Disposition: A | Discharge status: Skilled Nursing Facility | Payer: Medicare Other | Attending: Emergency Medicine | Admitting: Emergency Medicine

## 2021-12-07 ENCOUNTER — Other Ambulatory Visit: Payer: Self-pay

## 2021-12-07 DIAGNOSIS — R1011 Right upper quadrant pain: Secondary | ICD-10-CM | POA: Insufficient documentation

## 2021-12-07 DIAGNOSIS — R197 Diarrhea, unspecified: Secondary | ICD-10-CM | POA: Diagnosis not present

## 2021-12-07 DIAGNOSIS — Z8616 Personal history of COVID-19: Secondary | ICD-10-CM | POA: Insufficient documentation

## 2021-12-07 DIAGNOSIS — I2510 Atherosclerotic heart disease of native coronary artery without angina pectoris: Secondary | ICD-10-CM | POA: Diagnosis not present

## 2021-12-07 DIAGNOSIS — R1010 Upper abdominal pain, unspecified: Secondary | ICD-10-CM

## 2021-12-07 DIAGNOSIS — R1013 Epigastric pain: Secondary | ICD-10-CM | POA: Diagnosis present

## 2021-12-07 LAB — COMPREHENSIVE METABOLIC PANEL
ALT: 15 U/L (ref 0–44)
AST: 19 U/L (ref 15–41)
Albumin: 3.8 g/dL (ref 3.5–5.0)
Alkaline Phosphatase: 94 U/L (ref 38–126)
Anion gap: 12 (ref 5–15)
BUN: 21 mg/dL (ref 8–23)
CO2: 23 mmol/L (ref 22–32)
Calcium: 9.7 mg/dL (ref 8.9–10.3)
Chloride: 102 mmol/L (ref 98–111)
Creatinine, Ser: 1.04 mg/dL (ref 0.61–1.24)
GFR, Estimated: >60 mL/min (ref >60)
Glucose, Bld: 227 mg/dL — ABNORMAL HIGH (ref 70–99)
Potassium: 4.1 mmol/L (ref 3.5–5.1)
Sodium: 137 mmol/L (ref 135–145)
Total Bilirubin: 0.8 mg/dL (ref 0.3–1.2)
Total Protein: 8 g/dL (ref 6.5–8.1)

## 2021-12-07 LAB — CBC
HCT: 33.3 % — ABNORMAL LOW (ref 39.0–52.0)
Hemoglobin: 11.3 g/dL — ABNORMAL LOW (ref 13.0–17.0)
MCH: 29 pg (ref 26.0–34.0)
MCHC: 33.9 g/dL (ref 30.0–36.0)
MCV: 85.4 fL (ref 80.0–100.0)
Platelets: 239 10*3/uL (ref 150–400)
RBC: 3.9 MIL/uL — ABNORMAL LOW (ref 4.22–5.81)
RDW: 12.9 % (ref 11.5–15.5)
WBC: 8.5 10*3/uL (ref 4.0–10.5)
nRBC: 0 % (ref 0.0–0.2)

## 2021-12-07 LAB — TROPONIN I (HIGH SENSITIVITY): Troponin I (High Sensitivity): 7 ng/L (ref <18)

## 2021-12-07 LAB — LIPASE, BLOOD: Lipase: 23 U/L (ref 11–51)

## 2021-12-07 MED ORDER — ACETAMINOPHEN 500 MG PO TABS
1000.0000 mg | ORAL_TABLET | Freq: Once | ORAL | Status: AC
  Administered 2021-12-07: 1000 mg via ORAL
  Filled 2021-12-07: qty 2

## 2021-12-07 MED ORDER — MORPHINE SULFATE (PF) 4 MG/ML IV SOLN
4.0000 mg | Freq: Once | INTRAVENOUS | Status: AC
  Administered 2021-12-07: 4 mg via INTRAVENOUS
  Filled 2021-12-07: qty 1

## 2021-12-07 MED ORDER — LOPERAMIDE HCL 2 MG PO CAPS
4.0000 mg | ORAL_CAPSULE | Freq: Once | ORAL | Status: AC
  Administered 2021-12-07: 4 mg via ORAL
  Filled 2021-12-07: qty 2

## 2021-12-07 MED ORDER — ONDANSETRON HCL 4 MG/2ML IJ SOLN
4.0000 mg | Freq: Once | INTRAMUSCULAR | Status: AC
  Administered 2021-12-07: 4 mg via INTRAVENOUS
  Filled 2021-12-07: qty 2

## 2021-12-07 MED ORDER — FAMOTIDINE IN NACL 20-0.9 MG/50ML-% IV SOLN
20.0000 mg | Freq: Once | INTRAVENOUS | Status: AC
  Administered 2021-12-07: 20 mg via INTRAVENOUS
  Filled 2021-12-07: qty 50

## 2021-12-07 NOTE — ED Notes (Signed)
Attempted to call report to the facility x2 unsuccessful. Charge Rn notified. Secretary attempting to contact facility.

## 2021-12-07 NOTE — ED Notes (Signed)
Called lab at this time to obtain blood work.

## 2021-12-07 NOTE — ED Triage Notes (Signed)
Abd pain , loose bm's, axox3 pt from [facility]

## 2021-12-07 NOTE — Discharge Instructions (Addendum)
Follow-up with your regular doctor.  Please return emergency department worsening.  Take your regular medications as prescribed

## 2021-12-07 NOTE — ED Provider Notes (Signed)
[facility] Provider Note    Event Date/Time   First MD Initiated Contact with Patient 12/07/21 1432     (approximate)   History   Abdominal Pain   HPI  Steve Alvarado is a 66 y.o. male with history of CAD and recent positive COVID test presents emergency department with epigastric pain.  Patient states that he began having pain this morning prior to lunch.  They did not give him any medication.  Sent him to the ED for evaluation.  He has had some loose stools.  Denies shortness of breath.  No vomiting.      Physical Exam   Triage Vital Signs: ED Triage Vitals  Enc Vitals Group     BP 12/07/21 1219 135/78     Pulse Rate 12/07/21 1219 75     Resp 12/07/21 1219 17     Temp 12/07/21 1219 98.4 F (36.9 C)     Temp Source 12/07/21 1219 Oral     SpO2 12/07/21 1219 98 %     Weight 12/07/21 1220 200 lb 9.9 oz (91 kg)     Height 12/07/21 1220 6\' 2"  (1.88 m)     Head Circumference --      Peak Flow --      Pain Score 12/07/21 1220 6     Pain Loc --      Pain Edu? --      Excl. in GC? --     Most recent vital signs: Vitals:   12/07/21 1219  BP: 135/78  Pulse: 75  Resp: 17  Temp: 98.4 F (36.9 C)  SpO2: 98%     General: Awake, no distress.   CV:  Good peripheral perfusion. regular rate and  rhythm Resp:  Normal effort. Lungs CTA Abd:  No distention.  Mildly tender in the epigastric and right upper quadrant Other:      ED Results / Procedures / Treatments   Labs (all labs ordered are listed, but only abnormal results are displayed) Labs Reviewed  COMPREHENSIVE METABOLIC PANEL - Abnormal; Notable for the following components:      Result Value   Glucose, Bld 227 (*)    All other components within normal limits  CBC - Abnormal; Notable for the following components:   RBC 3.90 (*)    Hemoglobin 11.3 (*)    HCT 33.3 (*)    All other components within normal limits  LIPASE, BLOOD  URINALYSIS, ROUTINE W REFLEX MICROSCOPIC   TROPONIN I (HIGH SENSITIVITY)  TROPONIN I (HIGH SENSITIVITY)     EKG  EKG   RADIOLOGY Chest x-ray, ultrasound right upper quadrant    PROCEDURES:   Procedures   MEDICATIONS ORDERED IN ED: Medications  famotidine (PEPCID) IVPB 20 mg premix (20 mg Intravenous New Bag/Given 12/07/21 1527)  ondansetron (ZOFRAN) injection 4 mg (4 mg Intravenous Given 12/07/21 1527)  morphine (PF) 4 MG/ML injection 4 mg (4 mg Intravenous Given 12/07/21 1541)     IMPRESSION / MDM / ASSESSMENT AND PLAN / ED COURSE  I reviewed the triage vital signs and the nursing notes.                              Differential diagnosis includes, but is not limited to, acute cholecystitis, PUD, pancreatitis, MI, covid  Patient's presentation is most consistent with acute complicated illness / injury requiring diagnostic workup.   Patient's labs are reassuring CBC, metabolic panel,  lipase and troponin are all normal  Patient had relief with pain medication, he was given morphine 4 mg IV, Zofran 4 mg IV, and Pepcid 20 mg IV.  He states his abdominal pain has resolved.  He will be sent back to [facility].  He is in stable condition.  Do not feel that patient needs admission as his labs are normal and his pain has been resolved.     FINAL CLINICAL IMPRESSION(S) / ED DIAGNOSES   Final diagnoses:  Pain of upper abdomen     Rx / DC Orders   ED Discharge Orders     None        Note:  This document was prepared using Dragon voice recognition software and may include unintentional dictation errors.    Versie Starks, PA-C 12/07/21 1635    Carrie Mew, MD 12/07/21 [phone]

## 2021-12-07 NOTE — ED Triage Notes (Signed)
First Nurse Note:  Pt via EMS from [facility]. Pt c/o epigastric abd pain. Endorses nausea. Also having some loose BM. Pt is A&Ox3 and NAD  Pt is COVID positive on Sunday  12 lead unremarkable per EMS

## 2021-12-21 ENCOUNTER — Ambulatory Visit: Payer: Medicare Other | Admitting: Family

## 2021-12-21 ENCOUNTER — Telehealth: Payer: Self-pay | Admitting: Family

## 2021-12-21 NOTE — Telephone Encounter (Signed)
Patient did not show for his [facility] appointment on 12/21/21. Will attempt to reschedule.

## 2021-12-22 NOTE — Progress Notes (Deleted)
Cardiology Clinic Note   Patient Name: Steve Alvarado Date of Encounter: 12/22/2021  Primary Care Provider:  Kingsley Callander, MD Primary Cardiologist:  Ida Rogue, MD  Patient Profile    66 year old male with a past medical history of CAD, hypertension, hyperlipidemia, basal ganglia stroke, DM2, and hypothyroidism, who is here today to follow-up on his CAD.  Past Medical History    Past Medical History:  Diagnosis Date   Basal ganglia stroke ([place])    a. 07/2021 L sided wkns/facial droop/fall-->MRI brain: nonhemorrhagic infarct of posterior R lentiform nucleus and corona radiata measure. Remote lacunar infarcts of post cerebellum bilat and L thalamus.   CHF (congestive heart failure) (HCC)    CKD (chronic kidney disease), stage II    a. 08/2021 AKI w/ creat up to 3.4.   Cocaine use    Coronary artery disease    a. 07/2010 PCI: LAD 22m, LCX 80 diff/small, OM1 5m, RCA 60m (3.0x18 Vision BMS). EF 50%; b. 07/2013 Cath: LM nl, LAD 30p, 20d, LCX 20d, LCX 100, OM1 60, RCA 30p, 66m, RPDA 90-->Med Rx; c. 10/2020 MV: EF 46% (60-65% by echo), no ischemia. 3V cor Ca2+.   Depression    Diabetes mellitus    Type II   Diastolic dysfunction    a. 10/2020 Echo: EF 60-65%, no rwma, mild LVH, GrI DD, nl RV size/fxn; b. 07/2021 Echo: EF 60-65%, no rwma, GrI DD, nl RV fxn, mildly dil LA, triv MR; c. 10/2021 Echo: EF 55-60%, no rwma, GrI DD, nl RV fxn, mild-mod MR, Asc Ao 26mm.   Hyperlipidemia    Hypertension    Hypothyroidism    Past Surgical History:  Procedure Laterality Date   CARDIAC CATHETERIZATION  08/06/2010   Bare metal stent placed in RCA.   HAND SURGERY     left    Allergies  Allergies  Allergen Reactions   Ace Inhibitors Cough    Reaction not listed on MAR    History of Present Illness    Steve Alvarado is a 66 year old male with a complex medical history of CAD status post stent placement to the mid RCA in 2012, hypertension, hyperlipidemia, basal ganglia stroke in  May 2023, type 2 diabetes, and hypothyroidism.  Originally seen post hospital in May 2012 after he had been admitted for chest pain.  Underwent cardiac catheterization revealing severe lesion to his RCA with also diffuse mid to distal left circumflex disease, mild proximal OM disease, moderate mid LAD disease.  He had successful placement of a bare-metal stent placed to the right coronary artery.  Presented to the [facility] emergency department again in July 2013 with continued complaints of chest pain and had been unable to afford his medications.  Stated that he had been taking his Effient.  Chest pain was thought to be likely from uncontrolled hypertension.  After discussion of symptoms it was recommended patient undergo repeat heart catheterization which at that time he declined.  Presents to the [facility] emergency department again 06/2013 experiencing left-sided chest heaviness on exertion relieved after a few minutes of rest and recently required 1 day and 2 sublingual nitroglycerin over the last month.  This was associated with tachypalpitations, lightheadedness and nausea.  He also endorsed the use of marijuana and cocaine approximately 3 days prior. Repeat left heart catheterization 06/2013 which revealed mid LAD 30%, distal LAD 20%, mid left circumflex 100%, OM1 60%, proximal RCA mid RCA stent 50% ISR, 50%, distal RCA 30%, RPDA 90%, EF 55% aggressive  medical therapy and cocaine cessation was recommended.  Lost to follow-up until 10/21/2020 when he presented to the [facility] emergency department subsequently admitted for chest pain that he described as midsternal chest discomfort not related with exertion ongoing over the past 2 days with associated shortness of breath.  He continued to endorse cocaine use.  Troponins trended at 27 and 24, serum creatinine 1.57, EKG showed sinus rhythm.  MPI with no significant ischemia, normal wall motion, EF estimated at 46%, considered low risk scan.  Echocardiogram revealed LVEF  60-65%, no regional wall motion abnormalities, mild left ventricular hypertrophy, grade 1 diastolic dysfunction with impaired relaxation, no valvular abnormalities noted.  In May 2023, he was admitted for basal ganglia stroke after presenting for acute left-sided weakness, facial droop and fall.  He was discharged to SNF on aspirin and Plavix for 3 weeks then only aspirin.  At that time he was also restarted on his Coreg, amlodipine, and losartan.  He was also recommended for outpatient cardiac monitoring to rule out atrial fibrillation.  He subsequently was hospitalized for AKI few days after discharge on 08/20/2021.  On 11/05/2021 he had noted feeling weaker.  On 11/06/2021 staff noted hematuria, and 11/07/2021 he was brought to the emergency department at [facility] where in the waiting room he experienced 1 episode of chest pain/chest tightness associated with dyspnea that resolved after 10 minutes without intervention.  High-sensitivity troponins were elevated at 190 => 243 => 245.  Chest CTA to rule out PE but showed moderate bilateral pleural effusions with cardiomegaly suggestive of CHF.  BNP was 464.5.  He was given 60 mg of IV furosemide and was started on heparin infusion that was later stopped due to decrease in hemoglobin/hematuria.  He responded well to diuresis.  Limited echocardiogram revealed EF 55-60%, without wall motion abnormality./22/23 he was discharged back to the skilled nursing facility.  Sent to the emergency department at [facility] again on 12/07/2021 with a complaint of epigastric pain after recently testing positive for COVID-19.  She was medicated with Zofran, morphine, and Pepcid.  Labs and imaging were unrevealing and with symptom improvement he was discharged back to [facility].  He returns to clinic today for hospital follow-up    Home Medications    Current Outpatient Medications  Medication Sig Dispense Refill   amLODipine (NORVASC) 10 MG tablet Take 1 tablet (10 mg total) by  mouth every morning. 30 tablet 0   ASPERCREME LIDOCAINE 4 % Place 1 patch onto the skin See admin instructions. Apply 1 patch topically daily in the morning     aspirin EC 81 MG tablet Take 1 tablet (81 mg total) by mouth daily. Swallow whole. 30 tablet 12   atorvastatin (LIPITOR) 40 MG tablet Take 1 tablet (40 mg total) by mouth every evening. (Patient taking differently: Take 40 mg by mouth daily.) 30 tablet 0   carvedilol (COREG) 3.125 MG tablet Take 1 tablet (3.125 mg total) by mouth 2 (two) times daily with a meal. (Patient taking differently: Take 3.125 mg by mouth in the morning and at bedtime.) 60 tablet 0   citalopram (CELEXA) 20 MG tablet Take 1 tablet (20 mg total) by mouth daily. 30 tablet 0   clopidogrel (PLAVIX) 75 MG tablet Take 1 tablet (75 mg total) by mouth daily. (Patient taking differently: Take 75 mg by mouth at bedtime.) 17 tablet 0   DULoxetine (CYMBALTA) 60 MG capsule Take 60 mg by mouth daily.     ferrous sulfate 325 (65 FE) MG  EC tablet Take 1 tablet (325 mg total) by mouth every other day. 60 tablet 3   furosemide (LASIX) 20 MG tablet Take 1 tablet (20 mg total) by mouth daily. 30 tablet 11   gabapentin (NEURONTIN) 400 MG capsule Take 400 mg by mouth 3 (three) times daily. 8am, 12pm 4pm     insulin lispro (HUMALOG) 100 UNIT/ML injection Inject 0.03 mLs (3 Units total) into the skin 3 (three) times daily with meals. 10 mL 11   LANTUS SOLOSTAR 100 UNIT/ML Solostar Pen Inject 20 Units into the skin at bedtime.     levothyroxine (SYNTHROID) 75 MCG tablet Take 75 mcg by mouth at bedtime. 2200     lidocaine 4 % Place 1 patch onto the skin daily. Apply one patch topically to left abdomen every morning - leave on for 12 hours. 12 hours on and 12 hours off.     melatonin 5 MG TABS Take 5 mg by mouth at bedtime.     methocarbamol (ROBAXIN) 500 MG tablet Take 500 mg by mouth 2 (two) times daily.     pantoprazole (PROTONIX) 40 MG tablet Take 40 mg by mouth every morning.      polyethylene glycol (MIRALAX / GLYCOLAX) 17 g packet Take 17 g by mouth daily as needed for mild constipation. 14 each 0   potassium chloride (KLOR-CON M) 10 MEQ tablet Take 1 tablet (10 mEq total) by mouth 2 (two) times daily.     sacubitril-valsartan (ENTRESTO) 24-26 MG Take 1 tablet by mouth 2 (two) times daily. 60 tablet 3   No current facility-administered medications for this visit.     Family History    Family History  Problem Relation Age of Onset   Heart attack Mother    He indicated that his mother is deceased. He indicated that his father is deceased.  Social History    Social History   Socioeconomic History   Marital status: Legally Separated    Spouse name: Not on file   Number of children: Not on file   Years of education: Not on file   Highest education level: Not on file  Occupational History   Not on file  Tobacco Use   Smoking status: Never   Smokeless tobacco: Never  Vaping Use   Vaping Use: Never used  Substance and Sexual Activity   Alcohol use: No   Drug use: Yes    Comment: crack   Sexual activity: Not on file  Other Topics Concern   Not on file  Social History Narrative   Not on file   Social Determinants of Health   Financial Resource Strain: Not on file  Food Insecurity: Not on file  Transportation Needs: Not on file  Physical Activity: Not on file  Stress: Not on file  Social Connections: Not on file  Intimate Partner Violence: Not on file     Review of Systems    General:  No chills, fever, night sweats or weight changes.  Cardiovascular:  No chest pain, dyspnea on exertion, edema, orthopnea, palpitations, paroxysmal nocturnal dyspnea. Dermatological: No rash, lesions/masses Respiratory: No cough, dyspnea Urologic: No hematuria, dysuria Abdominal:   No nausea, vomiting, diarrhea, bright red blood per rectum, melena, or hematemesis Neurologic:  No visual changes, wkns, changes in mental status. All other systems reviewed and are  otherwise negative except as noted above.     Physical Exam    VS:  There were no vitals taken for this visit. , BMI There is no  height or weight on file to calculate BMI.     GEN: Well nourished, well developed, in no acute distress. HEENT: normal. Neck: Supple, no JVD, carotid bruits, or masses. Cardiac: RRR, no murmurs, rubs, or gallops. No clubbing, cyanosis, edema.  Radials/DP/PT 2+ and equal bilaterally.  Respiratory:  Respirations regular and unlabored, clear to auscultation bilaterally. GI: Soft, nontender, nondistended, BS + x 4. MS: no deformity or atrophy. Skin: warm and dry, no rash. Neuro:  Strength and sensation are intact. Psych: Normal affect.  Accessory Clinical Findings    ECG personally reviewed by me today- *** - No acute changes  Lab Results  Component Value Date   WBC 8.5 12/07/2021   HGB 11.3 (L) 12/07/2021   HCT 33.3 (L) 12/07/2021   MCV 85.4 12/07/2021   PLT 239 12/07/2021   Lab Results  Component Value Date   CREATININE 1.04 12/07/2021   BUN 21 12/07/2021   NA 137 12/07/2021   K 4.1 12/07/2021   CL 102 12/07/2021   CO2 23 12/07/2021   Lab Results  Component Value Date   ALT 15 12/07/2021   AST 19 12/07/2021   ALKPHOS 94 12/07/2021   BILITOT 0.8 12/07/2021   Lab Results  Component Value Date   CHOL 146 08/13/2021   HDL 66 08/13/2021   LDLCALC 60 08/13/2021   TRIG 99 08/13/2021   CHOLHDL 2.2 08/13/2021    Lab Results  Component Value Date   HGBA1C 7.9 (H) 08/13/2021    Assessment & Plan   1.  ***  Biddie Sebek, NP 12/22/2021, 2:27 PM

## 2021-12-23 ENCOUNTER — Encounter: Payer: Self-pay | Admitting: Cardiology

## 2021-12-23 ENCOUNTER — Ambulatory Visit: Payer: Medicare Other | Attending: Cardiology | Admitting: Cardiology

## 2021-12-31 ENCOUNTER — Ambulatory Visit (HOSPITAL_BASED_OUTPATIENT_CLINIC_OR_DEPARTMENT_OTHER): Payer: Medicare Other | Admitting: Family

## 2021-12-31 ENCOUNTER — Other Ambulatory Visit
Admission: RE | Admit: 2021-12-31 | Discharge: 2021-12-31 | Disposition: A | Discharge status: Home / Self Care | Payer: Medicare Other | Source: Ambulatory Visit | Attending: Family | Admitting: Family

## 2021-12-31 ENCOUNTER — Encounter: Payer: Self-pay | Admitting: Family

## 2021-12-31 VITALS — BP 155/76 | HR 78 | Resp 16

## 2021-12-31 DIAGNOSIS — I5032 Chronic diastolic (congestive) heart failure: Secondary | ICD-10-CM | POA: Diagnosis not present

## 2021-12-31 DIAGNOSIS — E1142 Type 2 diabetes mellitus with diabetic polyneuropathy: Secondary | ICD-10-CM | POA: Diagnosis not present

## 2021-12-31 DIAGNOSIS — Z8673 Personal history of transient ischemic attack (TIA), and cerebral infarction without residual deficits: Secondary | ICD-10-CM | POA: Insufficient documentation

## 2021-12-31 DIAGNOSIS — Z7982 Long term (current) use of aspirin: Secondary | ICD-10-CM | POA: Diagnosis not present

## 2021-12-31 DIAGNOSIS — I6381 Other cerebral infarction due to occlusion or stenosis of small artery: Secondary | ICD-10-CM

## 2021-12-31 DIAGNOSIS — Z794 Long term (current) use of insulin: Secondary | ICD-10-CM

## 2021-12-31 DIAGNOSIS — E1122 Type 2 diabetes mellitus with diabetic chronic kidney disease: Secondary | ICD-10-CM | POA: Insufficient documentation

## 2021-12-31 DIAGNOSIS — Z993 Dependence on wheelchair: Secondary | ICD-10-CM | POA: Insufficient documentation

## 2021-12-31 DIAGNOSIS — I252 Old myocardial infarction: Secondary | ICD-10-CM | POA: Diagnosis not present

## 2021-12-31 DIAGNOSIS — I10 Essential (primary) hypertension: Secondary | ICD-10-CM | POA: Diagnosis not present

## 2021-12-31 DIAGNOSIS — I2510 Atherosclerotic heart disease of native coronary artery without angina pectoris: Secondary | ICD-10-CM | POA: Insufficient documentation

## 2021-12-31 DIAGNOSIS — I130 Hypertensive heart and chronic kidney disease with heart failure and stage 1 through stage 4 chronic kidney disease, or unspecified chronic kidney disease: Secondary | ICD-10-CM | POA: Diagnosis present

## 2021-12-31 LAB — BASIC METABOLIC PANEL
Anion gap: 8 (ref 5–15)
BUN: 29 mg/dL — ABNORMAL HIGH (ref 8–23)
CO2: 24 mmol/L (ref 22–32)
Calcium: 9.3 mg/dL (ref 8.9–10.3)
Chloride: 104 mmol/L (ref 98–111)
Creatinine, Ser: 1.1 mg/dL (ref 0.61–1.24)
GFR, Estimated: >60 mL/min (ref >60)
Glucose, Bld: 214 mg/dL — ABNORMAL HIGH (ref 70–99)
Potassium: 4.4 mmol/L (ref 3.5–5.1)
Sodium: 136 mmol/L (ref 135–145)

## 2021-12-31 NOTE — Patient Instructions (Signed)
Continue weighing daily and call for an overnight weight gain of 3 pounds or more or a weekly weight gain of more than 5 pounds.   If you have voicemail, please make sure your mailbox is cleaned out so that we may leave a message and please make sure to listen to any voicemails.     

## 2021-12-31 NOTE — Progress Notes (Signed)
Patient ID: Earnest Rosier, male    DOB: 11-Jan-1956, 66 y.o.   MRN: [id number]  HPI  Mr Rogan is a 66 y/o male with a history of CAD (previous stents), DM, hyperlipidemia, HTN, CKD, stroke, thyroid disease, depression, previous cocaine use and chronic heart failure.   ECHO report on 11/08/21 reviewed and showed Left ventricular ejection fraction of 55 to 60% along with moderate LVH  Had cath done 07/04/13 but unable to view results  Was in the ED 12/07/21 due to upper abdominal pain where he was evaluated and released. Admitted 11/10/21 for NSTEMI. CTA negative for PE. Started on low dose furosemide/potassium. Cardiology consult obtained. Discharged after 2 days. Was in the ED 09/27/21 due to left hip pain where he was evaluation and released.   He presents today for a follow-up visit with a chief complaint of non-productive cough. Describes this as having been present for a few weeks and is asking for mucinex cough syrup. Has associated occasional tremors and left wrist pain along with this. He denies any dizziness, difficulty sleeping, abdominal distention, palpitations, pedal edema, chest pain, shortness of breath or fatigue.   Continues to not be weighed daily even though the order was written at last visit. He says that he's hoping to move into an apartment next week with 24/7 care.   No issues with entresto that he's aware of.   Past Medical History:  Diagnosis Date   Basal ganglia stroke ([place])    a. 07/2021 L sided wkns/facial droop/fall-->MRI brain: nonhemorrhagic infarct of posterior R lentiform nucleus and corona radiata measure. Remote lacunar infarcts of post cerebellum bilat and L thalamus.   CHF (congestive heart failure) (HCC)    CKD (chronic kidney disease), stage II    a. 08/2021 AKI w/ creat up to 3.4.   Cocaine use    Coronary artery disease    a. 07/2010 PCI: LAD 59m, LCX 80 diff/small, OM1 16m, RCA 39m (3.0x18 Vision BMS). EF 50%; b. 07/2013 Cath: LM nl, LAD 30p, 20d, LCX  20d, LCX 100, OM1 60, RCA 30p, 31m, RPDA 90-->Med Rx; c. 10/2020 MV: EF 46% (60-65% by echo), no ischemia. 3V cor Ca2+.   Depression    Diabetes mellitus    Type II   Diastolic dysfunction    a. 10/2020 Echo: EF 60-65%, no rwma, mild LVH, GrI DD, nl RV size/fxn; b. 07/2021 Echo: EF 60-65%, no rwma, GrI DD, nl RV fxn, mildly dil LA, triv MR; c. 10/2021 Echo: EF 55-60%, no rwma, GrI DD, nl RV fxn, mild-mod MR, Asc Ao 64mm.   Hyperlipidemia    Hypertension    Hypothyroidism    Past Surgical History:  Procedure Laterality Date   CARDIAC CATHETERIZATION  08/06/2010   Bare metal stent placed in RCA.   HAND SURGERY     left   Family History  Problem Relation Age of Onset   Heart attack Mother    Social History   Tobacco Use   Smoking status: Never   Smokeless tobacco: Never  Substance Use Topics   Alcohol use: No   Allergies  Allergen Reactions   Ace Inhibitors Cough    Reaction not listed on MAR     Prior to Admission medications   Medication Sig Start Date End Date Taking? Authorizing Provider  amLODipine (NORVASC) 10 MG tablet Take 1 tablet (10 mg total) by mouth every morning. 08/19/21  Yes de Yolanda Manges, Cortney E, NP  ASPERCREME LIDOCAINE 4 % Place 1 patch  onto the skin See admin instructions. Apply 1 patch topically daily in the morning 07/12/21  Yes [provider]  aspirin EC 81 MG tablet Take 1 tablet (81 mg total) by mouth daily. Swallow whole. 08/18/21  Yes Shafer, Marcelino Scot, NP  atorvastatin (LIPITOR) 40 MG tablet Take 1 tablet (40 mg total) by mouth every evening. Patient taking differently: Take 40 mg by mouth daily. 10/23/20  Yes Max Sane, MD  carvedilol (COREG) 3.125 MG tablet Take 1 tablet (3.125 mg total) by mouth 2 (two) times daily with a meal. Patient taking differently: Take 3.125 mg by mouth in the morning and at bedtime. 10/23/20  Yes Max Sane, MD  citalopram (CELEXA) 20 MG tablet Take 1 tablet (20 mg total) by mouth daily. 10/23/20  Yes Max Sane, MD   clopidogrel (PLAVIX) 75 MG tablet Take 1 tablet (75 mg total) by mouth daily. Patient taking differently: Take 75 mg by mouth at bedtime. 08/18/21  Yes Shafer, Marcelino Scot, NP  DULoxetine (CYMBALTA) 60 MG capsule Take 60 mg by mouth daily. 11/04/21  Yes [provider]  ferrous sulfate 325 (65 FE) MG EC tablet Take 1 tablet (325 mg total) by mouth every other day. 11/09/21 11/09/22 Yes Wouk, Ailene Rud, MD  furosemide (LASIX) 20 MG tablet Take 1 tablet (20 mg total) by mouth daily. 11/09/21 11/09/22 Yes Wouk, Ailene Rud, MD  gabapentin (NEURONTIN) 400 MG capsule Take 400 mg by mouth 3 (three) times daily. 8am, 12pm 4pm 06/03/21  Yes [provider]  insulin lispro (HUMALOG) 100 UNIT/ML injection Inject 0.03 mLs (3 Units total) into the skin 3 (three) times daily with meals. 10/23/20  Yes Max Sane, MD  LANTUS SOLOSTAR 100 UNIT/ML Solostar Pen Inject 20 Units into the skin at bedtime. 09/14/21  Yes [provider]  levothyroxine (SYNTHROID) 75 MCG tablet Take 75 mcg by mouth at bedtime. 2200 05/19/21  Yes [provider]  lidocaine 4 % Place 1 patch onto the skin daily. Apply one patch topically to left abdomen every morning - leave on for 12 hours. 12 hours on and 12 hours off.   Yes [provider]  melatonin 5 MG TABS Take 5 mg by mouth at bedtime.   Yes [provider]  methocarbamol (ROBAXIN) 500 MG tablet Take 500 mg by mouth 2 (two) times daily. 11/04/21  Yes [provider]  pantoprazole (PROTONIX) 40 MG tablet Take 40 mg by mouth every morning. 05/19/21  Yes [provider]  polyethylene glycol (MIRALAX / GLYCOLAX) 17 g packet Take 17 g by mouth daily as needed for mild constipation. 08/22/21  Yes Dahal, Marlowe Aschoff, MD  potassium chloride (KLOR-CON M) 10 MEQ tablet Take 1 tablet (10 mEq total) by mouth 2 (two) times daily. 11/09/21  Yes Wouk, Ailene Rud, MD  sacubitril-valsartan (ENTRESTO) 24-26 MG Take 1 tablet by mouth 2 (two) times  daily. 11/29/21  Yes Alisa Graff, FNP   Review of Systems  Constitutional:  Negative for appetite change and fatigue.  HENT:  Negative for congestion, postnasal drip and sore throat.   Eyes:  Negative for visual disturbance.  Respiratory:  Positive for cough. Negative for chest tightness, shortness of breath and wheezing.   Cardiovascular:  Negative for chest pain, palpitations and leg swelling.  Gastrointestinal:  Negative for abdominal distention, abdominal pain, constipation, diarrhea, nausea and vomiting.  Endocrine: Negative.   Genitourinary:  Negative for difficulty urinating.  Musculoskeletal:  Positive for arthralgias (left hand).  Skin: Negative.   Allergic/Immunologic: Negative.  Neurological:  Positive for tremors (sometimes in the hands). Negative for dizziness and headaches.  Hematological:  Negative for adenopathy. Does not bruise/bleed easily.  Psychiatric/Behavioral:  Negative for dysphoric mood and sleep disturbance (sleeping on 1 pillow). The patient is not nervous/anxious.    Vitals:   12/31/21 1056  BP: (!) 155/76  Pulse: 78  Resp: 16  SpO2: 100%   Wt Readings from Last 3 Encounters:  12/07/21 200 lb 9.9 oz (91 kg)  11/09/21 201 lb 8 oz (91.4 kg)  08/20/21 198 lb 3.1 oz (89.9 kg)   Lab Results  Component Value Date   CREATININE 1.04 12/07/2021   CREATININE 1.33 (H) 11/09/2021   CREATININE 1.04 11/08/2021    Physical Exam Vitals and nursing note reviewed.  Constitutional:      Appearance: Normal appearance.  HENT:     Head: Normocephalic and atraumatic.  Cardiovascular:     Rate and Rhythm: Normal rate and regular rhythm.     Heart sounds: Normal heart sounds.  Pulmonary:     Effort: Pulmonary effort is normal.     Breath sounds: Normal breath sounds. No wheezing or rales.  Abdominal:     General: There is no distension.     Palpations: Abdomen is soft.     Tenderness: There is no abdominal tenderness.  Musculoskeletal:        General: No  tenderness.     Cervical back: Neck supple. No tenderness.     Right lower leg: Edema (trace pitting) present.     Left lower leg: Edema (trace pitting) present.  Skin:    General: Skin is warm and dry.  Neurological:     Mental Status: He is alert and oriented to person, place, and time. Mental status is at baseline.     Comments: Left-sided weakness  Psychiatric:        Mood and Affect: Mood normal.        Behavior: Behavior normal.        Thought Content: Thought content normal.        Judgment: Judgment normal.    Assessment and Plan:  1: Chronic heart failure with preserved ejection fraction with structural changes (LAE)- - NYHA class I - He follows a low-sodium diet and does not add salt to foods - Facility not weighing him daily even though an order was written at last visit; daily weight order written again today; could not weigh today in office due to being wheelchair bound - order written, again, for daily weight and to call for an overnight weight gain of > 2 pounds or a weekly weight gain of > 5 pounds - on GDMT of entresto - check BMP today since now taking entresto - Will consider Farxiga at next visit in three weeks although does have a history of UTI - order written for mucinex cough syrup 200mg  every 4-6 hours PRN - drinking 40-50 ounces of fluids daily - BNP 11/10/21 464.5  2: HTN- - BP mildly elevated (155/76) but he says that he hasn't taken his medications yet today - seeing PCP at facility; plan to resume care with his PCP once he is discharged - BMP 12/07/21 reviewed and showed sodium 137, potassium 4.1, creatinine 1.04 and GFR >60  3: CAD- - currently on lipitor and aspirin  - to see cardiology ([place]) 12/23/21  4: DM- - A1C 7.9% on 08/13/21 - Blood sugars are checked three times daily at facility   5: Stroke- - complete left sided weakness  due to stroke   Facility medication list reviewed.   Return in 1 month, sooner if needed.

## 2022-01-03 ENCOUNTER — Telehealth: Payer: Self-pay | Admitting: Family

## 2022-01-03 NOTE — Telephone Encounter (Signed)
Returned patients call stating we were suppose to send him a mucinex perscription due to bad cough and congestion and was asking [place] to send something in but when we returned call patients mailbox is full and currently residing at [facility]. I [facility] and left voicemail with nurse stating that we requested on order form that was sent back with the patient for them get him mucinex.  Brodric Schauer, NT

## 2022-02-04 NOTE — Progress Notes (Unsigned)
Patient ID: Steve Alvarado, male    DOB: 25-Jan-1956, 66 y.o.   MRN: [id number]  HPI  Mr Farino is a 66 y/o male with a history of CAD (previous stents), DM, hyperlipidemia, HTN, CKD, stroke, thyroid disease, depression, previous cocaine use and chronic heart failure.   ECHO report on 11/08/21 reviewed and showed Left ventricular ejection fraction of 55 to 60% along with moderate LVH  Had cath done 07/04/13 but unable to view results  Was in the ED 12/07/21 due to upper abdominal pain where he was evaluated and released. Admitted 11/10/21 for NSTEMI. CTA negative for PE. Started on low dose furosemide/potassium. Cardiology consult obtained. Discharged after 2 days. Was in the ED 09/27/21 due to left hip pain where he was evaluation and released.   He presents today for a follow-up visit with a chief complaint of continued cough. He says that this is chronic in nature. He has associated hand tremors and pain in his left hand along with this. He denies any difficulty sleeping, dizziness, abdominal distention, palpitations, pedal edema, chest pain, wheezing, shortness of breath or fatigue.   Says that he's leaving SNF tomorrow and is going home. He will be having an aide come in the mornings and family will be coming in the evening. Has home PT already set up as well as an appointment with his PCP.   Past Medical History:  Diagnosis Date   Basal ganglia stroke (HCC)    a. 07/2021 L sided wkns/facial droop/fall-->MRI brain: nonhemorrhagic infarct of posterior R lentiform nucleus and corona radiata measure. Remote lacunar infarcts of post cerebellum bilat and L thalamus.   CHF (congestive heart failure) (HCC)    CKD (chronic kidney disease), stage II    a. 08/2021 AKI w/ creat up to 3.4.   Cocaine use    Coronary artery disease    a. 07/2010 PCI: LAD 62m, LCX 80 diff/small, OM1 32m, RCA 4m (3.0x18 [facility] BMS). EF 50%; b. 07/2013 Cath: LM nl, LAD 30p, 20d, LCX 20d, LCX 100, OM1 60, RCA 30p, 30m, RPDA  90-->Med Rx; c. 10/2020 MV: EF 46% (60-65% by echo), no ischemia. 3V cor Ca2+.   Depression    Diabetes mellitus    Type II   Diastolic dysfunction    a. 10/2020 Echo: EF 60-65%, no rwma, mild LVH, GrI DD, nl RV size/fxn; b. 07/2021 Echo: EF 60-65%, no rwma, GrI DD, nl RV fxn, mildly dil LA, triv MR; c. 10/2021 Echo: EF 55-60%, no rwma, GrI DD, nl RV fxn, mild-mod MR, Asc Ao 89mm.   Hyperlipidemia    Hypertension    Hypothyroidism    Past Surgical History:  Procedure Laterality Date   CARDIAC CATHETERIZATION  08/06/2010   Bare metal stent placed in RCA.   HAND SURGERY     left   Family History  Problem Relation Age of Onset   Heart attack Mother    Social History   Tobacco Use   Smoking status: Never   Smokeless tobacco: Never  Substance Use Topics   Alcohol use: No   Allergies  Allergen Reactions   Ace Inhibitors Cough    Reaction not listed on MAR     Prior to Admission medications   Medication Sig Start Date End Date Taking? Authorizing Provider  albuterol (VENTOLIN HFA) 108 (90 Base) MCG/ACT inhaler Inhale 2 puffs into the lungs every 6 (six) hours as needed for wheezing.   Yes [provider]  amLODipine (NORVASC) 10 MG tablet Take  1 tablet (10 mg total) by mouth every morning. 08/19/21  Yes de Saintclair Halsted, Cortney E, NP  ASPERCREME LIDOCAINE 4 % Place 1 patch onto the skin See admin instructions. Apply 1 patch topically daily in the morning 07/12/21  Yes [provider]  aspirin EC 81 MG tablet Take 1 tablet (81 mg total) by mouth daily. Swallow whole. 08/18/21  Yes Shafer, Ludger Nutting, NP  atorvastatin (LIPITOR) 40 MG tablet Take 1 tablet (40 mg total) by mouth every evening. Patient taking differently: Take 40 mg by mouth daily. 10/23/20  Yes Delfino Lovett, MD  carvedilol (COREG) 3.125 MG tablet Take 1 tablet (3.125 mg total) by mouth 2 (two) times daily with a meal. Patient taking differently: Take 3.125 mg by mouth in the morning and at bedtime. 10/23/20  Yes Delfino Lovett, MD  citalopram (CELEXA) 20 MG tablet Take 1 tablet (20 mg total) by mouth daily. 10/23/20  Yes Delfino Lovett, MD  clopidogrel (PLAVIX) 75 MG tablet Take 1 tablet (75 mg total) by mouth daily. Patient taking differently: Take 75 mg by mouth at bedtime. 08/18/21  Yes Shafer, Ludger Nutting, NP  DULoxetine (CYMBALTA) 60 MG capsule Take 60 mg by mouth daily. 11/04/21  Yes [provider]  ferrous sulfate 325 (65 FE) MG EC tablet Take 1 tablet (325 mg total) by mouth every other day. 11/09/21 11/09/22 Yes Wouk, Wilfred Curtis, MD  furosemide (LASIX) 20 MG tablet Take 1 tablet (20 mg total) by mouth daily. 11/09/21 11/09/22 Yes Wouk, Wilfred Curtis, MD  gabapentin (NEURONTIN) 400 MG capsule Take 400 mg by mouth 3 (three) times daily. 8am, 12pm 4pm 06/03/21  Yes [provider]  insulin lispro (HUMALOG) 100 UNIT/ML injection Inject 0.03 mLs (3 Units total) into the skin 3 (three) times daily with meals. 10/23/20  Yes Delfino Lovett, MD  LANTUS SOLOSTAR 100 UNIT/ML Solostar Pen Inject 20 Units into the skin at bedtime. 09/14/21  Yes [provider]  levothyroxine (SYNTHROID) 75 MCG tablet Take 75 mcg by mouth at bedtime. 2200 05/19/21  Yes [provider]  lidocaine 4 % Place 1 patch onto the skin daily. Apply one patch topically to left abdomen every morning - leave on for 12 hours. 12 hours on and 12 hours off.   Yes [provider]  melatonin 5 MG TABS Take 5 mg by mouth at bedtime.   Yes [provider]  methocarbamol (ROBAXIN) 500 MG tablet Take 500 mg by mouth 2 (two) times daily. 11/04/21  Yes [provider]  pantoprazole (PROTONIX) 40 MG tablet Take 40 mg by mouth every morning. 05/19/21  Yes [provider]  polyethylene glycol (MIRALAX / GLYCOLAX) 17 g packet Take 17 g by mouth daily as needed for mild constipation. 08/22/21  Yes Dahal, Melina Schools, MD  potassium chloride (KLOR-CON M) 10 MEQ tablet Take 1 tablet (10 mEq total) by mouth 2 (two) times daily.  11/09/21  Yes Wouk, Wilfred Curtis, MD  pregabalin (LYRICA) 50 MG capsule Take 50 mg by mouth 3 (three) times daily. 01/24/22  Yes [provider]  sacubitril-valsartan (ENTRESTO) 24-26 MG Take 1 tablet by mouth 2 (two) times daily. 11/29/21  Yes Delma Freeze, FNP    Review of Systems  Constitutional:  Negative for appetite change and fatigue.  HENT:  Negative for congestion, postnasal drip and sore throat.   Eyes:  Negative for visual disturbance.  Respiratory:  Positive for cough. Negative for chest tightness, shortness of breath and wheezing.   Cardiovascular:  Negative for chest pain, palpitations and leg swelling.  Gastrointestinal:  Negative for abdominal distention and abdominal pain.  Endocrine: Negative.   Genitourinary:  Negative for difficulty urinating.  Musculoskeletal:  Positive for arthralgias (left hand).  Skin: Negative.   Allergic/Immunologic: Negative.   Neurological:  Positive for tremors (sometimes in the hands). Negative for dizziness and headaches.  Hematological:  Negative for adenopathy. Does not bruise/bleed easily.  Psychiatric/Behavioral:  Negative for dysphoric mood and sleep disturbance (sleeping on 1 pillow). The patient is not nervous/anxious.    Vitals:   02/07/22 1000  BP: (!) 151/73  Pulse: 71  Resp: 16  SpO2: 100%   Wt Readings from Last 3 Encounters:  12/07/21 200 lb 9.9 oz (91 kg)  11/09/21 201 lb 8 oz (91.4 kg)  08/20/21 198 lb 3.1 oz (89.9 kg)   Lab Results  Component Value Date   CREATININE 1.10 12/31/2021   CREATININE 1.04 12/07/2021   CREATININE 1.33 (H) 11/09/2021   Physical Exam Vitals and nursing note reviewed.  Constitutional:      Appearance: Normal appearance.  HENT:     Head: Normocephalic and atraumatic.  Cardiovascular:     Rate and Rhythm: Normal rate and regular rhythm.     Heart sounds: Normal heart sounds.  Pulmonary:     Effort: Pulmonary effort is normal.     Breath sounds: Normal breath sounds. No  wheezing or rales.  Abdominal:     General: There is no distension.     Palpations: Abdomen is soft.     Tenderness: There is no abdominal tenderness.  Musculoskeletal:        General: No tenderness.     Cervical back: Neck supple. No tenderness.     Right lower leg: No edema.     Left lower leg: No edema.  Skin:    General: Skin is warm and dry.  Neurological:     Mental Status: He is alert and oriented to person, place, and time. Mental status is at baseline.     Motor: Weakness (left side) present.  Psychiatric:        Mood and Affect: Mood normal.        Behavior: Behavior normal.    Assessment and Plan:  1: Chronic heart failure with preserved ejection fraction with structural changes (LAE)- - NYHA class I - He follows a low-sodium diet and does not add salt to foods - Facility not weighing him daily  - most likely will not be able to weigh safely at home - on GDMT of entresto - discussed titrating up entresto at next visit after he gets settled in at home - history of UTI's so most likely will not be a candidate for SGLT2 - drinking 40-50 ounces of fluids daily - BNP 11/10/21 464.5  2: HTN- - BP mildly elevated (151/73) - seeing PCP at facility; plan to resume care with his PCP once he is discharged - BMP 12/31/21 reviewed and showed sodium 136, potassium 4.4, creatinine 1.10 and GFR >60  3: CAD- - currently on lipitor and aspirin  - was a NS for cardiology ([place]) 12/23/21  4: DM- - A1C 7.9% on 08/13/21 - Blood sugars are checked three times daily at facility   5: Stroke- - complete left sided weakness due to stroke - saw neurology Marlene Bast) 01/17/22; psychiatry and PT ordered   Facility medication list reviewed.   Return in 1 month, sooner if needed

## 2022-02-07 ENCOUNTER — Encounter: Payer: Self-pay | Admitting: Family

## 2022-02-07 ENCOUNTER — Ambulatory Visit: Payer: Medicare Other | Attending: Family | Admitting: Family

## 2022-02-07 VITALS — BP 151/73 | HR 71 | Resp 16

## 2022-02-07 DIAGNOSIS — I5032 Chronic diastolic (congestive) heart failure: Secondary | ICD-10-CM | POA: Diagnosis not present

## 2022-02-07 DIAGNOSIS — I2510 Atherosclerotic heart disease of native coronary artery without angina pectoris: Secondary | ICD-10-CM | POA: Diagnosis not present

## 2022-02-07 DIAGNOSIS — E1122 Type 2 diabetes mellitus with diabetic chronic kidney disease: Secondary | ICD-10-CM | POA: Diagnosis not present

## 2022-02-07 DIAGNOSIS — E1142 Type 2 diabetes mellitus with diabetic polyneuropathy: Secondary | ICD-10-CM

## 2022-02-07 DIAGNOSIS — R531 Weakness: Secondary | ICD-10-CM | POA: Insufficient documentation

## 2022-02-07 DIAGNOSIS — Z955 Presence of coronary angioplasty implant and graft: Secondary | ICD-10-CM | POA: Insufficient documentation

## 2022-02-07 DIAGNOSIS — I10 Essential (primary) hypertension: Secondary | ICD-10-CM

## 2022-02-07 DIAGNOSIS — Z794 Long term (current) use of insulin: Secondary | ICD-10-CM

## 2022-02-07 DIAGNOSIS — Z7982 Long term (current) use of aspirin: Secondary | ICD-10-CM | POA: Insufficient documentation

## 2022-02-07 DIAGNOSIS — I6381 Other cerebral infarction due to occlusion or stenosis of small artery: Secondary | ICD-10-CM

## 2022-02-07 DIAGNOSIS — Z87440 Personal history of urinary (tract) infections: Secondary | ICD-10-CM | POA: Insufficient documentation

## 2022-02-07 DIAGNOSIS — F32A Depression, unspecified: Secondary | ICD-10-CM | POA: Insufficient documentation

## 2022-02-07 DIAGNOSIS — Z8673 Personal history of transient ischemic attack (TIA), and cerebral infarction without residual deficits: Secondary | ICD-10-CM | POA: Diagnosis not present

## 2022-02-07 DIAGNOSIS — F1490 Cocaine use, unspecified, uncomplicated: Secondary | ICD-10-CM | POA: Diagnosis not present

## 2022-02-07 DIAGNOSIS — I130 Hypertensive heart and chronic kidney disease with heart failure and stage 1 through stage 4 chronic kidney disease, or unspecified chronic kidney disease: Secondary | ICD-10-CM | POA: Diagnosis present

## 2022-02-07 DIAGNOSIS — E785 Hyperlipidemia, unspecified: Secondary | ICD-10-CM | POA: Insufficient documentation

## 2022-02-07 NOTE — Patient Instructions (Signed)
Continue weighing daily and call for an overnight weight gain of 3 pounds or more or a weekly weight gain of more than 5 pounds.   If you have voicemail, please make sure your mailbox is cleaned out so that we may leave a message and please make sure to listen to any voicemails.     

## 2022-02-24 ENCOUNTER — Ambulatory Visit: Payer: Medicare Other | Admitting: Licensed Clinical Social Worker

## 2022-03-08 ENCOUNTER — Telehealth: Payer: Self-pay | Admitting: Family

## 2022-03-08 ENCOUNTER — Encounter: Payer: Medicare Other | Admitting: Family

## 2022-03-08 NOTE — Telephone Encounter (Signed)
Patient did not show for his [facility] appointment on 03/08/22. Will attempt to reschedule.   

## 2022-04-01 ENCOUNTER — Emergency Department
Admission: EM | Admit: 2022-04-01 | Discharge: 2022-04-02 | Disposition: A | Discharge status: Home / Self Care | Payer: 59 | Attending: Student in an Organized Health Care Education/Training Program | Admitting: Student in an Organized Health Care Education/Training Program

## 2022-04-01 ENCOUNTER — Emergency Department: Payer: 59

## 2022-04-01 DIAGNOSIS — Z1152 Encounter for screening for COVID-19: Secondary | ICD-10-CM | POA: Diagnosis not present

## 2022-04-01 DIAGNOSIS — Z79899 Other long term (current) drug therapy: Secondary | ICD-10-CM | POA: Diagnosis not present

## 2022-04-01 DIAGNOSIS — R109 Unspecified abdominal pain: Secondary | ICD-10-CM | POA: Insufficient documentation

## 2022-04-01 DIAGNOSIS — E119 Type 2 diabetes mellitus without complications: Secondary | ICD-10-CM | POA: Diagnosis not present

## 2022-04-01 DIAGNOSIS — R197 Diarrhea, unspecified: Secondary | ICD-10-CM | POA: Insufficient documentation

## 2022-04-01 DIAGNOSIS — R112 Nausea with vomiting, unspecified: Secondary | ICD-10-CM | POA: Diagnosis not present

## 2022-04-01 DIAGNOSIS — I2510 Atherosclerotic heart disease of native coronary artery without angina pectoris: Secondary | ICD-10-CM | POA: Insufficient documentation

## 2022-04-01 DIAGNOSIS — N3000 Acute cystitis without hematuria: Secondary | ICD-10-CM

## 2022-04-01 LAB — URINALYSIS, ROUTINE W REFLEX MICROSCOPIC
Bilirubin Urine: NEGATIVE
Glucose, UA: NEGATIVE mg/dL
Ketones, ur: 5 mg/dL — AB
Nitrite: NEGATIVE
Protein, ur: 100 mg/dL — AB
Specific Gravity, Urine: 1.034 — ABNORMAL HIGH (ref 1.005–1.030)
WBC, UA: >50 WBC/hpf — ABNORMAL HIGH (ref 0–5)
pH: 7 (ref 5.0–8.0)

## 2022-04-01 LAB — CBC WITH DIFFERENTIAL/PLATELET
Abs Immature Granulocytes: 0.03 10*3/uL (ref 0.00–0.07)
Basophils Absolute: 0 10*3/uL (ref 0.0–0.1)
Basophils Relative: 0 %
Eosinophils Absolute: 0 10*3/uL (ref 0.0–0.5)
Eosinophils Relative: 0 %
HCT: 35.1 % — ABNORMAL LOW (ref 39.0–52.0)
Hemoglobin: 11.7 g/dL — ABNORMAL LOW (ref 13.0–17.0)
Immature Granulocytes: 0 %
Lymphocytes Relative: 14 %
Lymphs Abs: 1.4 10*3/uL (ref 0.7–4.0)
MCH: 27.7 pg (ref 26.0–34.0)
MCHC: 33.3 g/dL (ref 30.0–36.0)
MCV: 83 fL (ref 80.0–100.0)
Monocytes Absolute: 0.5 10*3/uL (ref 0.1–1.0)
Monocytes Relative: 5 %
Neutro Abs: 8.1 10*3/uL — ABNORMAL HIGH (ref 1.7–7.7)
Neutrophils Relative %: 81 %
Platelets: 257 10*3/uL (ref 150–400)
RBC: 4.23 MIL/uL (ref 4.22–5.81)
RDW: 13.7 % (ref 11.5–15.5)
WBC: 10.1 10*3/uL (ref 4.0–10.5)
nRBC: 0 % (ref 0.0–0.2)

## 2022-04-01 LAB — COMPREHENSIVE METABOLIC PANEL
ALT: 23 U/L (ref 0–44)
AST: 31 U/L (ref 15–41)
Albumin: 4 g/dL (ref 3.5–5.0)
Alkaline Phosphatase: 95 U/L (ref 38–126)
Anion gap: 14 (ref 5–15)
BUN: 28 mg/dL — ABNORMAL HIGH (ref 8–23)
CO2: 20 mmol/L — ABNORMAL LOW (ref 22–32)
Calcium: 9.6 mg/dL (ref 8.9–10.3)
Chloride: 104 mmol/L (ref 98–111)
Creatinine, Ser: 1.21 mg/dL (ref 0.61–1.24)
GFR, Estimated: >60 mL/min (ref >60)
Glucose, Bld: 154 mg/dL — ABNORMAL HIGH (ref 70–99)
Potassium: 4.9 mmol/L (ref 3.5–5.1)
Sodium: 138 mmol/L (ref 135–145)
Total Bilirubin: 1.4 mg/dL — ABNORMAL HIGH (ref 0.3–1.2)
Total Protein: 8.3 g/dL — ABNORMAL HIGH (ref 6.5–8.1)

## 2022-04-01 LAB — RESP PANEL BY RT-PCR (RSV, FLU A&B, COVID)  RVPGX2
Influenza A by PCR: NEGATIVE
Influenza B by PCR: NEGATIVE
Resp Syncytial Virus by PCR: NEGATIVE
SARS Coronavirus 2 by RT PCR: NEGATIVE

## 2022-04-01 LAB — TROPONIN I (HIGH SENSITIVITY)
Troponin I (High Sensitivity): 21 ng/L — ABNORMAL HIGH (ref <18)
Troponin I (High Sensitivity): 27 ng/L — ABNORMAL HIGH (ref <18)

## 2022-04-01 LAB — LIPASE, BLOOD: Lipase: 28 U/L (ref 11–51)

## 2022-04-01 LAB — LACTIC ACID, PLASMA: Lactic Acid, Venous: 1.5 mmol/L (ref 0.5–1.9)

## 2022-04-01 MED ORDER — MORPHINE SULFATE (PF) 4 MG/ML IV SOLN
4.0000 mg | INTRAVENOUS | Status: DC | PRN
  Administered 2022-04-01: 4 mg via INTRAVENOUS
  Filled 2022-04-01: qty 1

## 2022-04-01 MED ORDER — HYDROMORPHONE HCL 1 MG/ML IJ SOLN
0.5000 mg | INTRAMUSCULAR | Status: DC | PRN
  Administered 2022-04-01: 0.5 mg via INTRAVENOUS
  Filled 2022-04-01: qty 0.5

## 2022-04-01 MED ORDER — IOHEXOL 300 MG/ML  SOLN
100.0000 mL | Freq: Once | INTRAMUSCULAR | Status: AC | PRN
  Administered 2022-04-01: 100 mL via INTRAVENOUS

## 2022-04-01 MED ORDER — ONDANSETRON HCL 4 MG/2ML IJ SOLN
4.0000 mg | Freq: Once | INTRAMUSCULAR | Status: AC
  Administered 2022-04-01: 4 mg via INTRAVENOUS
  Filled 2022-04-01: qty 2

## 2022-04-01 MED ORDER — SODIUM CHLORIDE 0.9 % IV BOLUS
1000.0000 mL | Freq: Once | INTRAVENOUS | Status: AC
  Administered 2022-04-01: 1000 mL via INTRAVENOUS

## 2022-04-01 MED ORDER — SODIUM CHLORIDE 0.9 % IV SOLN
2.0000 g | Freq: Once | INTRAVENOUS | Status: AC
  Administered 2022-04-01: 2 g via INTRAVENOUS
  Filled 2022-04-01: qty 20

## 2022-04-01 MED ORDER — METOCLOPRAMIDE HCL 5 MG/ML IJ SOLN
10.0000 mg | Freq: Once | INTRAMUSCULAR | Status: AC
  Administered 2022-04-01: 10 mg via INTRAVENOUS
  Filled 2022-04-01: qty 2

## 2022-04-01 NOTE — ED Triage Notes (Signed)
Pt from home sent by caretaker for /N /V /D.  Sx started last night.

## 2022-04-01 NOTE — ED Provider Notes (Signed)
[facility] Provider Note    Event Date/Time   First MD Initiated Contact with Patient 04/01/22 2039     (approximate)   History   Nausea, Emesis, and Diarrhea   HPI  Steve Alvarado is a 67 y.o. male w/ h/o cad, substance abuse, DM, GERD p/w N./V/D abdominal pain since last night. No measured fever but has had chills.  No cp or SOB.  No new medications.  No headache.       Physical Exam   Triage Vital Signs: ED Triage Vitals  Enc Vitals Group     BP 04/01/22 2040 (!) 161/90     Pulse Rate 04/01/22 2040 92     Resp 04/01/22 2040 16     Temp 04/01/22 2040 99.6 F (37.6 C)     Temp Source 04/01/22 2040 Axillary     SpO2 04/01/22 2040 100 %     Weight 04/01/22 2043 200 lb (90.7 kg)     Height 04/01/22 2043 6\' 2"  (1.88 m)     Head Circumference --      Peak Flow --      Pain Score 04/01/22 2043 8     Pain Loc --      Pain Edu? --      Excl. in [place]? --     Most recent vital signs: Vitals:   04/01/22 2230 04/01/22 2300  BP: (!) 182/93 (!) 140/76  Pulse: 94 90  Resp: 17 16  Temp:    SpO2: 100% 96%     Constitutional: Alert Eyes: Conjunctivae are normal.  Head: Atraumatic. Nose: No congestion/rhinnorhea. Mouth/Throat: Mucous membranes are moist.   Neck: Painless ROM.  Cardiovascular:   Good peripheral circulation. Respiratory: Normal respiratory effort.  No retractions.  Gastrointestinal: Soft with generalized ttp, no guarding or rebound Musculoskeletal:  no deformity Neurologic:  MAE spontaneously. No gross focal neurologic deficits are appreciated.  Skin:  Skin is warm, dry and intact. No rash noted. Psychiatric:  calm    ED Results / Procedures / Treatments   Labs (all labs ordered are listed, but only abnormal results are displayed) Labs Reviewed  CBC WITH DIFFERENTIAL/PLATELET - Abnormal; Notable for the following components:      Result Value   Hemoglobin 11.7 (*)    HCT 35.1 (*)    Neutro Abs 8.1 (*)    All other  components within normal limits  COMPREHENSIVE METABOLIC PANEL - Abnormal; Notable for the following components:   CO2 20 (*)    Glucose, Bld 154 (*)    BUN 28 (*)    Total Protein 8.3 (*)    Total Bilirubin 1.4 (*)    All other components within normal limits  URINALYSIS, ROUTINE W REFLEX MICROSCOPIC - Abnormal; Notable for the following components:   Color, Urine YELLOW (*)    APPearance HAZY (*)    Specific Gravity, Urine 1.034 (*)    Hgb urine dipstick SMALL (*)    Ketones, ur 5 (*)    Protein, ur 100 (*)    Leukocytes,Ua LARGE (*)    WBC, UA >50 (*)    Bacteria, UA RARE (*)    All other components within normal limits  RESP PANEL BY RT-PCR (RSV, FLU A&B, COVID)  RVPGX2  LIPASE, BLOOD  LACTIC ACID, PLASMA  URINE DRUG SCREEN, QUALITATIVE ([facility] ONLY)  TROPONIN I (HIGH SENSITIVITY)  TROPONIN I (HIGH SENSITIVITY)     EKG  ED ECG REPORT I, Merlyn Lot, the attending  physician, personally viewed and interpreted this ECG.   Date: 04/01/2022  EKG Time: 20:43  Rate: 95  Rhythm: sinus  Axis: normal  Intervals: normal  ST&T Change: no stemi    RADIOLOGY Please see ED Course for my review and interpretation.  I personally reviewed all radiographic images ordered to evaluate for the above acute complaints and reviewed radiology reports and findings.  These findings were personally discussed with the patient.  Please see medical record for radiology report.    PROCEDURES:  Critical Care performed:   Procedures   MEDICATIONS ORDERED IN ED: Medications  morphine (PF) 4 MG/ML injection 4 mg (4 mg Intravenous Given 04/01/22 2121)  HYDROmorphone (DILAUDID) injection 0.5 mg (0.5 mg Intravenous Given 04/01/22 2252)  ondansetron (ZOFRAN) injection 4 mg (4 mg Intravenous Given 04/01/22 2120)  iohexol (OMNIPAQUE) 300 MG/ML solution 100 mL (100 mLs Intravenous Contrast Given 04/01/22 2156)  metoCLOPramide (REGLAN) injection 10 mg (10 mg Intravenous Given 04/01/22 2234)      IMPRESSION / MDM / ASSESSMENT AND PLAN / ED COURSE  I reviewed the triage vital signs and the nursing notes.                              Differential diagnosis includes, but is not limited to, enteritis, appendicitis, sbo, diverticulitis, colitis, viral illness, substance,   Patient presenting to the ER for evaluation of symptoms as described above.  Based on symptoms, risk factors and considered above differential, this presenting complaint could reflect a potentially life-threatening illness therefore the patient will be placed on continuous pulse oximetry and telemetry for monitoring.  Laboratory evaluation will be sent to evaluate for the above complaints.     Clinical Course as of 04/01/22 2322  Fri Apr 01, 2022  2213 Ct imaging on my review and interpretation does not show any evidence of SBO. [PR]  2248 Normal.  No significant leukocytosis.  DKA.  Planing of persistent discomfort.  Ordered Dilaudid. [PR]    Clinical Course User Index [PR] Merlyn Lot, MD   CT without clear explanation of the patient's symptoms.  Possible UTI.  Patient will be signed out to oncoming physician pending labs and reassessment.  FINAL CLINICAL IMPRESSION(S) / ED DIAGNOSES   Final diagnoses:  Nausea vomiting and diarrhea     Rx / DC Orders   ED Discharge Orders     None        Note:  This document was prepared using Dragon voice recognition software and may include unintentional dictation errors.    Merlyn Lot, MD 04/01/22 2322

## 2022-04-02 DIAGNOSIS — R112 Nausea with vomiting, unspecified: Secondary | ICD-10-CM | POA: Diagnosis not present

## 2022-04-02 LAB — URINE DRUG SCREEN, QUALITATIVE (ARMC ONLY)
Amphetamines, Ur Screen: NOT DETECTED
Barbiturates, Ur Screen: NOT DETECTED
Benzodiazepine, Ur Scrn: NOT DETECTED
Cannabinoid 50 Ng, Ur ~~LOC~~: NOT DETECTED
Cocaine Metabolite,Ur ~~LOC~~: NOT DETECTED
MDMA (Ecstasy)Ur Screen: NOT DETECTED
Methadone Scn, Ur: NOT DETECTED
Opiate, Ur Screen: POSITIVE — AB
Phencyclidine (PCP) Ur S: NOT DETECTED
Tricyclic, Ur Screen: NOT DETECTED

## 2022-04-02 MED ORDER — CEPHALEXIN 500 MG PO CAPS: 500.0000 mg | ORAL_CAPSULE | Freq: Three times a day (TID) | ORAL | 0 refills | Status: AC

## 2022-04-02 MED ORDER — ONDANSETRON 4 MG PO TBDP: 4.0000 mg | ORAL_TABLET | Freq: Three times a day (TID) | ORAL | 0 refills | Status: DC | PRN

## 2022-04-02 NOTE — Discharge Instructions (Addendum)
You have a urinary infection likely causing your symptoms.  You are being discharged with a prescription for Keflex antibiotic to take 3 times daily for the next 6 days, to finish a 1 week course. We gave you some antibiotics in the IV that will last about 24 hours, so please pick up that prescription later today, take 1 pill on Saturday night, and then start in earnest 3 times daily on Sunday. Finish the whole prescription, even if you are feeling better.  Also being discharged with a prescription for Zofran nausea medicine to use as needed for any further nausea and vomiting.

## 2022-04-02 NOTE — ED Provider Notes (Signed)
Patient received in signout from Dr. Quentin Cornwall pending remainder of labs and reassessment.  Urine with stigmata of acute cystitis, as indicated on CT imaging.  No signs of sepsis or further SIRS criteria.  After IV fluids and antiemetics, patient reports resolution of symptoms and is tolerating p.o. intake.  He is provided a dose of ceftriaxone IV and discharged with Keflex to finish a 1 week course for acute cystitis.  Also provided Zofran.  He is suitable for outpatient management.  We discussed return precautions.   Vladimir Crofts, MD 04/02/22 [phone]

## 2022-04-05 LAB — URINE CULTURE: Culture: >=100000 — AB

## 2022-04-16 NOTE — Progress Notes (Deleted)
Psychiatric Initial Adult Assessment   Patient Identification: Steve Alvarado MRN:  [id number] Date of Evaluation:  04/16/2022 Referral Source: *** Chief Complaint:  No chief complaint on file.  Visit Diagnosis: No diagnosis found.  History of Present Illness:   Steve Alvarado is a 67 y.o. 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 Alcohol, cannabis use,   rior admission for substance abuse. He denies ever attempting suicide, but tells me that he was really frightened yesterday with his thoughts of not wanting to press on.        Associated Signs/Symptoms: Depression Symptoms:  {DEPRESSION SYMPTOMS:20000} (Hypo) Manic Symptoms:  {BHH MANIC SYMPTOMS:22872} Anxiety Symptoms:  {BHH ANXIETY SYMPTOMS:22873} Psychotic Symptoms:  {BHH PSYCHOTIC SYMPTOMS:22874} PTSD Symptoms: {BHH PTSD SYMPTOMS:22875}  Past Psychiatric History:  Outpatient:  Psychiatry admission:  Previous suicide attempt:  Past trials of medication:  History of violence:  History of head injury:   Previous Psychotropic Medications: {YES/NO:21197}  Substance Abuse History in the last 12 months:  {yes no:314532}  Consequences of Substance Abuse: {BHH CONSEQUENCES OF SUBSTANCE ABUSE:22880}  Past Medical History:  Past Medical History:  Diagnosis Date   Basal ganglia stroke ([place])    a. 07/2021 L sided wkns/facial droop/fall-->MRI brain: nonhemorrhagic infarct of posterior R lentiform nucleus and corona radiata measure. Remote lacunar infarcts of post cerebellum bilat and L thalamus.   CHF (congestive heart failure) (HCC)    CKD (chronic kidney disease), stage II    a. 08/2021 AKI w/ creat up to 3.4.   Cocaine use    Coronary artery disease    a. 07/2010 PCI: LAD 49m LCX 80 diff/small, OM1 413mRCA 9969m.0x18 [facility]).  EF 50%; b. 07/2013 Cath: LM nl, LAD 30p, 20d, LCX 20d, LCX 100, OM1 60, RCA 30p, 19m30mDA 90-->Med Rx; c. 10/2020 MV: EF 46% (60-65% by echo), no ischemia. 3V cor Ca2+.   Depression    Diabetes mellitus    Type II   Diastolic dysfunction    a. 10/2020 Echo: EF 60-65%, no rwma, mild LVH, GrI DD, nl RV size/fxn; b. 07/2021 Echo: EF 60-65%, no rwma, GrI DD, nl RV fxn, mildly dil LA, triv MR; c. 10/2021 Echo: EF 55-60%, no rwma, GrI DD, nl RV fxn, mild-mod MR, Asc Ao 37mm56mHyperlipidemia    Hypertension    Hypothyroidism     Past Surgical History:  Procedure Laterality Date   CARDIAC CATHETERIZATION  08/06/2010   Bare metal stent placed in RCA.   HAND SURGERY     left    Family Psychiatric History: ***  Family History:  Family History  Problem Relation Age of Onset   Heart attack Mother     Social History:   Social History   Socioeconomic History   Marital status: Legally Separated    Spouse name: Not on file   Number of children: Not on file   Years of education: Not on file   Highest education level: Not on file  Occupational History   Not on file  Tobacco Use   Smoking status: Never   Smokeless tobacco: Never  Vaping Use   Vaping Use: Never used  Substance and Sexual Activity   Alcohol use: No   Drug use: Yes    Comment: crack   Sexual activity: Not on file  Other Topics Concern   Not  on file  Social History Narrative   Not on file   Social Determinants of Health   Financial Resource Strain: Not on file  Food Insecurity: Not on file  Transportation Needs: Not on file  Physical Activity: Not on file  Stress: Not on file  Social Connections: Not on file    Additional Social History: ***  Allergies:   Allergies  Allergen Reactions   Ace Inhibitors Cough    Reaction not listed on MAR    Metabolic Disorder Labs: Lab Results  Component Value Date   HGBA1C 7.9 (H) 08/13/2021   MPG 180.03 08/13/2021   MPG 185.77 07/09/2021   No results found for:  "PROLACTIN" Lab Results  Component Value Date   CHOL 146 08/13/2021   TRIG 99 08/13/2021   HDL 66 08/13/2021   CHOLHDL 2.2 08/13/2021   VLDL 20 08/13/2021   LDLCALC 60 08/13/2021   LDLCALC 112 (H) 10/21/2020   Lab Results  Component Value Date   TSH 6.004 (H) 08/06/2020    Therapeutic Level Labs: No results found for: "LITHIUM" No results found for: "CBMZ" No results found for: "VALPROATE"  Current Medications: Current Outpatient Medications  Medication Sig Dispense Refill   albuterol (VENTOLIN HFA) 108 (90 Base) MCG/ACT inhaler Inhale 2 puffs into the lungs every 6 (six) hours as needed for wheezing.     amLODipine (NORVASC) 10 MG tablet Take 1 tablet (10 mg total) by mouth every morning. 30 tablet 0   ASPERCREME LIDOCAINE 4 % Place 1 patch onto the skin See admin instructions. Apply 1 patch topically daily in the morning     aspirin EC 81 MG tablet Take 1 tablet (81 mg total) by mouth daily. Swallow whole. 30 tablet 12   atorvastatin (LIPITOR) 40 MG tablet Take 1 tablet (40 mg total) by mouth every evening. (Patient taking differently: Take 40 mg by mouth daily.) 30 tablet 0   carvedilol (COREG) 3.125 MG tablet Take 1 tablet (3.125 mg total) by mouth 2 (two) times daily with a meal. (Patient taking differently: Take 3.125 mg by mouth in the morning and at bedtime.) 60 tablet 0   citalopram (CELEXA) 20 MG tablet Take 1 tablet (20 mg total) by mouth daily. 30 tablet 0   clopidogrel (PLAVIX) 75 MG tablet Take 1 tablet (75 mg total) by mouth daily. (Patient taking differently: Take 75 mg by mouth at bedtime.) 17 tablet 0   DULoxetine (CYMBALTA) 60 MG capsule Take 60 mg by mouth daily.     ferrous sulfate 325 (65 FE) MG EC tablet Take 1 tablet (325 mg total) by mouth every other day. 60 tablet 3   furosemide (LASIX) 20 MG tablet Take 1 tablet (20 mg total) by mouth daily. 30 tablet 11   gabapentin (NEURONTIN) 400 MG capsule Take 400 mg by mouth 3 (three) times daily. 8am, 12pm 4pm      insulin lispro (HUMALOG) 100 UNIT/ML injection Inject 0.03 mLs (3 Units total) into the skin 3 (three) times daily with meals. 10 mL 11   LANTUS SOLOSTAR 100 UNIT/ML Solostar Pen Inject 20 Units into the skin at bedtime.     levothyroxine (SYNTHROID) 75 MCG tablet Take 75 mcg by mouth at bedtime. 2200     lidocaine 4 % Place 1 patch onto the skin daily. Apply one patch topically to left abdomen every morning - leave on for 12 hours. 12 hours on and 12 hours off.     melatonin 5 MG TABS Take 5 mg  by mouth at bedtime.     methocarbamol (ROBAXIN) 500 MG tablet Take 500 mg by mouth 2 (two) times daily.     ondansetron (ZOFRAN-ODT) 4 MG disintegrating tablet Take 1 tablet (4 mg total) by mouth every 8 (eight) hours as needed. 20 tablet 0   pantoprazole (PROTONIX) 40 MG tablet Take 40 mg by mouth every morning.     polyethylene glycol (MIRALAX / GLYCOLAX) 17 g packet Take 17 g by mouth daily as needed for mild constipation. 14 each 0   potassium chloride (KLOR-CON M) 10 MEQ tablet Take 1 tablet (10 mEq total) by mouth 2 (two) times daily.     pregabalin (LYRICA) 50 MG capsule Take 50 mg by mouth 3 (three) times daily.     sacubitril-valsartan (ENTRESTO) 24-26 MG Take 1 tablet by mouth 2 (two) times daily. 60 tablet 3   No current facility-administered medications for this visit.    Musculoskeletal: Strength & Muscle Tone: within normal limits Gait & Station: normal Patient leans: N/A  Psychiatric Specialty Exam: Review of Systems  There were no vitals taken for this visit.There is no height or weight on file to calculate BMI.  General Appearance: {Appearance:22683}  Eye Contact:  {BHH EYE CONTACT:22684}  Speech:  Clear and Coherent  Volume:  Normal  Mood:  {BHH MOOD:22306}  Affect:  {Affect (PAA):22687}  Thought Process:  Coherent  Orientation:  Full (Time, Place, and Person)  Thought Content:  Logical  Suicidal Thoughts:  {ST/HT (PAA):22692}  Homicidal Thoughts:  {ST/HT (PAA):22692}   Memory:  Immediate;   Good  Judgement:  {Judgement (PAA):22694}  Insight:  {Insight (PAA):22695}  Psychomotor Activity:  Normal  Concentration:  Concentration: Good and Attention Span: Good  Recall:  Good  Fund of Knowledge:Good  Language: Good  Akathisia:  No  Handed:  Right  AIMS (if indicated):  not done  Assets:  Communication Skills Desire for Improvement  ADL's:  Intact  Cognition: WNL  Sleep:  {BHH GOOD/FAIR/POOR:22877}   Screenings: CAGE-AID    Flowsheet Row ED to Hosp-Admission (Discharged) from 08/12/2021 in [place] Score 0      [place] Office Visit from 12/31/2021 in [place] Office Visit from 11/29/2021 in [place]  Total GAD-7 Score 0 0      PHQ2-9    [place] Office Visit from 12/31/2021 in [place]  PHQ-2 Total Score 1      Flowsheet [place] ED from 12/07/2021 in [facility] Emergency Department at [facility] ED to Hosp-Admission (Discharged) from 11/07/2021 in [place] [facility] ED from 09/27/2021 in [facility] Emergency Department at [place] No Risk No Risk No Risk       Assessment and Plan:  Assessment  Plan   The patient demonstrates the following risk factors for suicide: Chronic risk factors for suicide include: {Chronic Risk Factors for HD:3327074. Acute risk factors for suicide include: {Acute Risk Factors for NL:6244280. Protective factors for this patient include: {Protective Factors for Suicide FR:7288263. Considering these factors, the overall suicide risk at this point appears to be {Desc; low/moderate/high:110033}. Patient {ACTION; IS/IS GI:087931 appropriate for outpatient follow up.   Collaboration of Care: {BH OP Collaboration of Care:21014065}  Patient/Guardian was  advised Release of Information must be obtained prior to any record release in order to collaborate their care with  an outside provider. Patient/Guardian was advised if they have not already done so to contact the registration department to sign all necessary forms in order for [place] to release information regarding their care.   Consent: Patient/Guardian gives verbal consent for treatment and assignment of benefits for services provided during this visit. Patient/Guardian expressed understanding and agreed to proceed.   Norman Clay, MD 1/27/20244:28 PM

## 2022-04-19 ENCOUNTER — Ambulatory Visit: Payer: Medicare Other | Admitting: Psychiatry

## 2022-04-26 ENCOUNTER — Other Ambulatory Visit: Payer: Self-pay

## 2022-04-26 ENCOUNTER — Emergency Department: Payer: 59

## 2022-04-26 ENCOUNTER — Emergency Department
Admission: EM | Admit: 2022-04-26 | Discharge: 2022-04-27 | Disposition: A | Discharge status: Home / Self Care | Payer: 59 | Attending: Emergency Medicine | Admitting: Emergency Medicine

## 2022-04-26 DIAGNOSIS — R1030 Lower abdominal pain, unspecified: Secondary | ICD-10-CM | POA: Insufficient documentation

## 2022-04-26 DIAGNOSIS — Z1152 Encounter for screening for COVID-19: Secondary | ICD-10-CM | POA: Insufficient documentation

## 2022-04-26 DIAGNOSIS — E119 Type 2 diabetes mellitus without complications: Secondary | ICD-10-CM | POA: Insufficient documentation

## 2022-04-26 DIAGNOSIS — I509 Heart failure, unspecified: Secondary | ICD-10-CM | POA: Diagnosis not present

## 2022-04-26 DIAGNOSIS — Z8673 Personal history of transient ischemic attack (TIA), and cerebral infarction without residual deficits: Secondary | ICD-10-CM | POA: Insufficient documentation

## 2022-04-26 DIAGNOSIS — I700 Atherosclerosis of aorta: Secondary | ICD-10-CM | POA: Diagnosis not present

## 2022-04-26 LAB — URINALYSIS, ROUTINE W REFLEX MICROSCOPIC
Bacteria, UA: NONE SEEN
Bilirubin Urine: NEGATIVE
Glucose, UA: NEGATIVE mg/dL
Ketones, ur: NEGATIVE mg/dL
Leukocytes,Ua: NEGATIVE
Nitrite: NEGATIVE
Protein, ur: 100 mg/dL — AB
Specific Gravity, Urine: 1.019 (ref 1.005–1.030)
pH: 6 (ref 5.0–8.0)

## 2022-04-26 LAB — COMPREHENSIVE METABOLIC PANEL
ALT: 12 U/L (ref 0–44)
AST: 23 U/L (ref 15–41)
Albumin: 2.7 g/dL — ABNORMAL LOW (ref 3.5–5.0)
Alkaline Phosphatase: 64 U/L (ref 38–126)
Anion gap: 12 (ref 5–15)
BUN: 17 mg/dL (ref 8–23)
CO2: 20 mmol/L — ABNORMAL LOW (ref 22–32)
Calcium: 7 mg/dL — ABNORMAL LOW (ref 8.9–10.3)
Chloride: 107 mmol/L (ref 98–111)
Creatinine, Ser: 0.8 mg/dL (ref 0.61–1.24)
GFR, Estimated: >60 mL/min (ref >60)
Glucose, Bld: 161 mg/dL — ABNORMAL HIGH (ref 70–99)
Potassium: 2.6 mmol/L — CL (ref 3.5–5.1)
Sodium: 139 mmol/L (ref 135–145)
Total Bilirubin: 0.9 mg/dL (ref 0.3–1.2)
Total Protein: 5.7 g/dL — ABNORMAL LOW (ref 6.5–8.1)

## 2022-04-26 LAB — CBC
HCT: 30.7 % — ABNORMAL LOW (ref 39.0–52.0)
Hemoglobin: 10.3 g/dL — ABNORMAL LOW (ref 13.0–17.0)
MCH: 28.8 pg (ref 26.0–34.0)
MCHC: 33.6 g/dL (ref 30.0–36.0)
MCV: 85.8 fL (ref 80.0–100.0)
Platelets: 249 10*3/uL (ref 150–400)
RBC: 3.58 MIL/uL — ABNORMAL LOW (ref 4.22–5.81)
RDW: 13.8 % (ref 11.5–15.5)
WBC: 9 10*3/uL (ref 4.0–10.5)
nRBC: 0 % (ref 0.0–0.2)

## 2022-04-26 LAB — RESP PANEL BY RT-PCR (RSV, FLU A&B, COVID)  RVPGX2
Influenza A by PCR: NEGATIVE
Influenza B by PCR: NEGATIVE
Resp Syncytial Virus by PCR: NEGATIVE
SARS Coronavirus 2 by RT PCR: NEGATIVE

## 2022-04-26 LAB — LIPASE, BLOOD: Lipase: 25 U/L (ref 11–51)

## 2022-04-26 LAB — TYPE AND SCREEN
ABO/RH(D): A NEG
Antibody Screen: NEGATIVE

## 2022-04-26 MED ORDER — IOHEXOL 300 MG/ML  SOLN
100.0000 mL | Freq: Once | INTRAMUSCULAR | Status: AC | PRN
  Administered 2022-04-26: 100 mL via INTRAVENOUS

## 2022-04-26 MED ORDER — MIDAZOLAM HCL 2 MG/2ML IJ SOLN
1.0000 mg | Freq: Once | INTRAMUSCULAR | Status: AC
  Administered 2022-04-26: 1 mg via INTRAVENOUS
  Filled 2022-04-26: qty 2

## 2022-04-26 MED ORDER — SODIUM CHLORIDE 0.9 % IV BOLUS
1000.0000 mL | Freq: Once | INTRAVENOUS | Status: AC
  Administered 2022-04-26: 1000 mL via INTRAVENOUS

## 2022-04-26 MED ORDER — ONDANSETRON HCL 4 MG/2ML IJ SOLN
4.0000 mg | Freq: Once | INTRAMUSCULAR | Status: AC
  Administered 2022-04-26: 4 mg via INTRAVENOUS
  Filled 2022-04-26: qty 2

## 2022-04-26 MED ORDER — POTASSIUM CHLORIDE 10 MEQ/100ML IV SOLN
10.0000 meq | INTRAVENOUS | Status: AC
  Administered 2022-04-26 (×2): 10 meq via INTRAVENOUS
  Filled 2022-04-26 (×2): qty 100

## 2022-04-26 NOTE — ED Provider Notes (Signed)
[facility] Provider Note   Event Date/Time   First MD Initiated Contact with Patient 04/26/22 1541     (approximate) History  Abdominal Pain  HPI Steve Alvarado is a 67 y.o. male with a past medical history of GERD, CHF, stroke, and diabetes who presents for abdominal pain over the last 24 hours that he describes as suprapubic and associated with polyuria.  States that he had a urinary tract infection approximately 1-2 months ago and states that the symptoms feel similar to when he was diagnosed at that time.  Patient also complains of difficulty falling asleep and getting rest that is been present over the last 4 days and he states is associated to "busy house". ROS: Patient currently denies any vision changes, tinnitus, difficulty speaking, facial droop, sore throat, chest pain, shortness of breath, nausea/vomiting/diarrhea, or weakness/numbness/paresthesias in any extremity   Physical Exam  Triage Vital Signs: ED Triage Vitals  Enc Vitals Group     BP 04/26/22 1545 (!) 152/87     Pulse Rate 04/26/22 1545 87     Resp 04/26/22 1545 16     Temp 04/26/22 1545 98.9 F (37.2 C)     Temp Source 04/26/22 1545 Oral     SpO2 04/26/22 1545 100 %     Weight 04/26/22 1541 200 lb (90.7 kg)     Height 04/26/22 1541 6\' 2"  (1.88 m)     Head Circumference --      Peak Flow --      Pain Score 04/26/22 1541 5     Pain Loc --      Pain Edu? --      Excl. in [place]? --    Most recent vital signs: Vitals:   04/26/22 2131 04/26/22 2345  BP: (!) 147/77 136/83  Pulse: 79 72  Resp: 18 16  Temp: 98.6 F (37 C)   SpO2: 99% 100%   General: Awake, oriented x4. CV:  Good peripheral perfusion.  Resp:  Normal effort.  Abd:  No distention.  Other:  Elderly overweight African-American male laying in bed in no acute distress ED Results / Procedures / Treatments  Labs (all labs ordered are listed, but only abnormal results are displayed) Labs Reviewed  COMPREHENSIVE  METABOLIC PANEL - Abnormal; Notable for the following components:      Result Value   Potassium 2.6 (*)    CO2 20 (*)    Glucose, Bld 161 (*)    Calcium 7.0 (*)    Total Protein 5.7 (*)    Albumin 2.7 (*)    All other components within normal limits  CBC - Abnormal; Notable for the following components:   RBC 3.58 (*)    Hemoglobin 10.3 (*)    HCT 30.7 (*)    All other components within normal limits  URINALYSIS, ROUTINE W REFLEX MICROSCOPIC - Abnormal; Notable for the following components:   Color, Urine STRAW (*)    APPearance CLEAR (*)    Hgb urine dipstick SMALL (*)    Protein, ur 100 (*)    All other components within normal limits  RESP PANEL BY RT-PCR (RSV, FLU A&B, COVID)  RVPGX2  LIPASE, BLOOD  TYPE AND SCREEN  RADIOLOGY ED MD interpretation: CT of the abdomen and pelvis with IV contrast interpreted independently by me shows no CT evidence of acute intra-abdominal or pelvic abnormalities -Agree with radiology assessment Official radiology report(s): CT Abdomen Pelvis W Contrast  Result Date: 04/26/2022 CLINICAL DATA:  Left lower quadrant pain EXAM: CT ABDOMEN AND PELVIS WITH CONTRAST TECHNIQUE: Multidetector CT imaging of the abdomen and pelvis was performed using the standard protocol following bolus administration of intravenous contrast. RADIATION DOSE REDUCTION: This exam was performed according to the departmental dose-optimization program which includes automated exposure control, adjustment of the mA and/or kV according to patient size and/or use of iterative reconstruction technique. CONTRAST:  126mL OMNIPAQUE IOHEXOL 300 MG/ML  SOLN COMPARISON:  CT 04/01/2022 FINDINGS: Lower chest: Lung bases demonstrate no acute airspace disease. Coronary vascular calcification. Mild gynecomastia Hepatobiliary: No focal liver abnormality is seen. No gallstones, gallbladder wall thickening, or biliary dilatation. Pancreas: Unremarkable. No pancreatic ductal dilatation or surrounding  inflammatory changes. Spleen: Normal in size without focal abnormality. Adrenals/Urinary Tract: Adrenal glands are normal. Kidneys show no hydronephrosis. The bladder is unremarkable Stomach/Bowel: Stomach is within normal limits. Appendix appears normal. No evidence of bowel wall thickening, distention, or inflammatory changes. Vascular/Lymphatic: Mild aortic atherosclerosis. No aneurysm. No suspicious lymph nodes. Circumaortic left renal vein. Reproductive: Prostate is unremarkable. Other: Negative for pelvic effusion or free air. Small fat containing right inguinal hernia Musculoskeletal: No acute osseous abnormality. IMPRESSION: 1. No CT evidence for acute intra-abdominal or pelvic abnormality. 2. Aortic atherosclerosis. Aortic Atherosclerosis (ICD10-I70.0). Electronically Signed   By: Donavan Foil M.D.   On: 04/26/2022 19:03   PROCEDURES: Critical Care performed: No Procedures MEDICATIONS ORDERED IN ED: Medications  sodium chloride 0.9 % bolus 1,000 mL (0 mLs Intravenous Stopped 04/26/22 2159)  ondansetron (ZOFRAN) injection 4 mg (4 mg Intravenous Given 04/26/22 1809)  iohexol (OMNIPAQUE) 300 MG/ML solution 100 mL (100 mLs Intravenous Contrast Given 04/26/22 1830)  potassium chloride 10 mEq in 100 mL IVPB (0 mEq Intravenous Stopped 04/26/22 2123)  midazolam (VERSED) injection 1 mg (1 mg Intravenous Given 04/26/22 2133)   IMPRESSION / MDM / ASSESSMENT AND PLAN / ED COURSE  I reviewed the triage vital signs and the nursing notes.                             The patient is on the cardiac monitor to evaluate for evidence of arrhythmia and/or significant heart rate changes. Patient's presentation is most consistent with acute presentation with potential threat to life or bodily function. Patient presents for abdominal pain.  Differential diagnosis includes appendicitis, abdominal aortic aneurysm, surgical biliary disease, pancreatitis, SBO, mesenteric ischemia, serious intra-abdominal bacterial illness,  genital torsion. Doubt atypical ACS. Based on history, physical exam, radiologic/laboratory evaluation, there is no red flag results or symptomatology requiring emergent intervention or need for admission at this time Pt tolerating PO. Disposition: Patient will be discharged with strict return precautions and follow up with primary MD within 12-24 hours for further evaluation. Patient understands that this still may have an early presentation of an emergent medical condition such as appendicitis that will require a recheck.   FINAL CLINICAL IMPRESSION(S) / ED DIAGNOSES   Final diagnoses:  Lower abdominal pain   Rx / DC Orders   ED Discharge Orders     None      Note:  This document was prepared using Dragon voice recognition software and may include unintentional dictation errors.   Naaman Plummer, MD 04/26/22 [phone]

## 2022-04-26 NOTE — ED Notes (Signed)
Pt stated he wanted to leave ama. Provider aware.

## 2022-04-26 NOTE — ED Notes (Signed)
Cory on the way to transport patient home for discharge.

## 2022-04-26 NOTE — ED Notes (Signed)
Per pt friend, Call Tommi Rumps to pick patient up if he is going to be discharged.

## 2022-04-26 NOTE — ED Triage Notes (Signed)
Abdomen pain that started yesterday. Hx of GERD, CHF, Stroke, DM.  EMS VS 167/77 BP 100% RA 84 HR  CBG 203 AxO LS weakness from previous stroke

## 2022-07-01 ENCOUNTER — Other Ambulatory Visit: Payer: Self-pay

## 2022-07-01 ENCOUNTER — Emergency Department
Admission: EM | Admit: 2022-07-01 | Discharge: 2022-07-02 | Disposition: A | Discharge status: Home / Self Care | Payer: 59 | Attending: Emergency Medicine | Admitting: Emergency Medicine

## 2022-07-01 DIAGNOSIS — E1122 Type 2 diabetes mellitus with diabetic chronic kidney disease: Secondary | ICD-10-CM | POA: Insufficient documentation

## 2022-07-01 DIAGNOSIS — F1410 Cocaine abuse, uncomplicated: Secondary | ICD-10-CM | POA: Diagnosis present

## 2022-07-01 DIAGNOSIS — N182 Chronic kidney disease, stage 2 (mild): Secondary | ICD-10-CM | POA: Diagnosis not present

## 2022-07-01 DIAGNOSIS — Z79899 Other long term (current) drug therapy: Secondary | ICD-10-CM | POA: Diagnosis not present

## 2022-07-01 DIAGNOSIS — F32A Depression, unspecified: Secondary | ICD-10-CM | POA: Diagnosis not present

## 2022-07-01 DIAGNOSIS — F4323 Adjustment disorder with mixed anxiety and depressed mood: Secondary | ICD-10-CM | POA: Diagnosis not present

## 2022-07-01 DIAGNOSIS — I509 Heart failure, unspecified: Secondary | ICD-10-CM | POA: Insufficient documentation

## 2022-07-01 DIAGNOSIS — E039 Hypothyroidism, unspecified: Secondary | ICD-10-CM | POA: Insufficient documentation

## 2022-07-01 DIAGNOSIS — I6381 Other cerebral infarction due to occlusion or stenosis of small artery: Secondary | ICD-10-CM | POA: Diagnosis present

## 2022-07-01 DIAGNOSIS — Z794 Long term (current) use of insulin: Secondary | ICD-10-CM | POA: Insufficient documentation

## 2022-07-01 DIAGNOSIS — I10 Essential (primary) hypertension: Secondary | ICD-10-CM | POA: Diagnosis present

## 2022-07-01 DIAGNOSIS — F4329 Adjustment disorder with other symptoms: Secondary | ICD-10-CM | POA: Diagnosis not present

## 2022-07-01 DIAGNOSIS — E1165 Type 2 diabetes mellitus with hyperglycemia: Secondary | ICD-10-CM | POA: Insufficient documentation

## 2022-07-01 DIAGNOSIS — I130 Hypertensive heart and chronic kidney disease with heart failure and stage 1 through stage 4 chronic kidney disease, or unspecified chronic kidney disease: Secondary | ICD-10-CM | POA: Insufficient documentation

## 2022-07-01 NOTE — ED Notes (Signed)
Patient dressed out into hospital scrubs at this time. With this RN and LEO as witness. Pt belongings as follows:  1 shirt 1 jacket 1 pair pants 1 pair socks 1 pair shoes 1 silver metal necklace 1 phone  RN unable to change pt pants in triage as pt is wheelchair bound unable to stand. Pants to be changed to hospital safe scrubs once pt is in a bed.   2 attempts made at blood collection without success.

## 2022-07-01 NOTE — ED Triage Notes (Signed)
Presents with [facility] and care coordinator with IVC papers in place. Per [employer] pt wife took out IVC for threats of SI, cocaine use, and poorly managed DM.   Pt irritable in triage and difficult to redirect. Pt care coordinator/support is of great help redirecting patient.   Pt adamantly denies SI or HI. Reports cocaine use 2 months ago but denies high frequency of use. States BGL well managed at home and compliant with medications. Pt and care coordinator both expressing that pt family is attempting to get him out of his house and that they have had similar problems in the past. Care coordinator reports numerous out patient services and resources that pt is utilizing for support. Pt denies pain or medical concerns.

## 2022-07-02 DIAGNOSIS — F1410 Cocaine abuse, uncomplicated: Secondary | ICD-10-CM | POA: Diagnosis not present

## 2022-07-02 DIAGNOSIS — F4329 Adjustment disorder with other symptoms: Secondary | ICD-10-CM | POA: Diagnosis not present

## 2022-07-02 LAB — COMPREHENSIVE METABOLIC PANEL
ALT: 14 U/L (ref 0–44)
AST: 16 U/L (ref 15–41)
Albumin: 3.2 g/dL — ABNORMAL LOW (ref 3.5–5.0)
Alkaline Phosphatase: 101 U/L (ref 38–126)
Anion gap: 12 (ref 5–15)
BUN: 16 mg/dL (ref 8–23)
CO2: 25 mmol/L (ref 22–32)
Calcium: 9.1 mg/dL (ref 8.9–10.3)
Chloride: 95 mmol/L — ABNORMAL LOW (ref 98–111)
Creatinine, Ser: 0.96 mg/dL (ref 0.61–1.24)
GFR, Estimated: >60 mL/min (ref >60)
Glucose, Bld: 344 mg/dL — ABNORMAL HIGH (ref 70–99)
Potassium: 3.5 mmol/L (ref 3.5–5.1)
Sodium: 132 mmol/L — ABNORMAL LOW (ref 135–145)
Total Bilirubin: 0.7 mg/dL (ref 0.3–1.2)
Total Protein: 7.5 g/dL (ref 6.5–8.1)

## 2022-07-02 LAB — CBC
HCT: 31.8 % — ABNORMAL LOW (ref 39.0–52.0)
Hemoglobin: 10.8 g/dL — ABNORMAL LOW (ref 13.0–17.0)
MCH: 28.4 pg (ref 26.0–34.0)
MCHC: 34 g/dL (ref 30.0–36.0)
MCV: 83.7 fL (ref 80.0–100.0)
Platelets: 301 10*3/uL (ref 150–400)
RBC: 3.8 MIL/uL — ABNORMAL LOW (ref 4.22–5.81)
RDW: 12.3 % (ref 11.5–15.5)
WBC: 6.4 10*3/uL (ref 4.0–10.5)
nRBC: 0 % (ref 0.0–0.2)

## 2022-07-02 LAB — ACETAMINOPHEN LEVEL: Acetaminophen (Tylenol), Serum: <10 ug/mL — ABNORMAL LOW (ref 10–30)

## 2022-07-02 LAB — SALICYLATE LEVEL: Salicylate Lvl: <7 mg/dL — ABNORMAL LOW (ref 7.0–30.0)

## 2022-07-02 LAB — ETHANOL: Alcohol, Ethyl (B): <10 mg/dL (ref <10)

## 2022-07-02 NOTE — ED Notes (Signed)
RN assumed care of pt, who is angry but is complying w/ requests.  He states that his family has been trying to IVC him all day and finally got a judge to say yes. 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

## 2022-07-02 NOTE — ED Provider Notes (Signed)
[facility] Provider Note    Event Date/Time   First MD Initiated Contact with Patient 07/01/22 2335     (approximate)   History   IVC    HPI SERAFIM KIESEWETTER is a 67 y.o. male with an extensive psychiatric and medical history who presents under involuntary commitment by Patent examiner.  Reportedly his wife took out an IVC on him because she claims the patient was threatening suicide, because of ongoing cocaine use, and poorly managed diabetes.  However, he is with a caregiver (see nursing documentation for additional details) who is very supportive and helpful.  The patient has multiple outpatient resources including this caregiver (Mr. Raul Del), and both the patient and the patient care coordinator reports that they are concerned that the patient's spouse and family are trying to IVC him under inappropriate circumstances.  The patient is adamant that he is not suicidal and that he has a safe place to live and plenty of help.  He denies all of the reports and is calm and cooperative with [place] after initial period of irritability in triage.     Physical Exam   Triage Vital Signs: ED Triage Vitals  Enc Vitals Group     BP 07/01/22 2314 (!) 149/90     Pulse Rate 07/01/22 2314 88     Resp 07/01/22 2314 20     Temp 07/01/22 2314 98.2 F (36.8 C)     Temp Source 07/01/22 2314 Oral     SpO2 07/01/22 2314 100 %     Weight --      Height --      Head Circumference --      Peak Flow --      Pain Score 07/01/22 2313 0     Pain Loc --      Pain Edu? --      Excl. in [place]? --     Most recent vital signs: Vitals:   07/01/22 2314  BP: (!) 149/90  Pulse: 88  Resp: 20  Temp: 98.2 F (36.8 C)  SpO2: 100%    General: Awake, no distress.  Pleasant and calm disposition with me. CV:  Good peripheral perfusion.  Resp:  Normal effort. Speaking easily and comfortably, no accessory muscle usage nor intercostal retractions.   Abd:  No distention.  Other:  Denies  SI and HI, claims that he is being targeted unfairly by family, and he is in no distress and not representing a danger to himself or anyone else.   ED Results / Procedures / Treatments   Labs (all labs ordered are listed, but only abnormal results are displayed) Labs Reviewed  COMPREHENSIVE METABOLIC PANEL - Abnormal; Notable for the following components:      Result Value   Sodium 132 (*)    Chloride 95 (*)    Glucose, Bld 344 (*)    Albumin 3.2 (*)    All other components within normal limits  SALICYLATE LEVEL - Abnormal; Notable for the following components:   Salicylate Lvl <7.0 (*)    All other components within normal limits  ACETAMINOPHEN LEVEL - Abnormal; Notable for the following components:   Acetaminophen (Tylenol), Serum <10 (*)    All other components within normal limits  CBC - Abnormal; Notable for the following components:   RBC 3.80 (*)    Hemoglobin 10.8 (*)    HCT 31.8 (*)    All other components within normal limits  ETHANOL  PROCEDURES:  Critical Care performed: No  Procedures    IMPRESSION / MDM / ASSESSMENT AND PLAN / ED COURSE  I reviewed the triage vital signs and the nursing notes.                              Differential diagnosis includes, but is not limited to, mood disorder, adjustment disorder, substance abuse, metabolic or electrolyte abnormality, extenuating circumstances.  Patient's presentation is most consistent with acute presentation with potential threat to life or bodily function.  Labs/studies ordered: CMP, CBC, acetaminophen level, salicylate level, ethanol level  Interventions/Medications given:  Medications - No data to display  (Note:  hospital course my include additional interventions and/or labs/studies not listed above.)   Vital signs are essentially normal other than hypertension.  Physical exam is reassuring and the patient's behavior with me is very appropriate.  No acute abnormalities identified on lab  work.  He did not provide a urine specimen but I also do not think it is absolutely necessary.  We know he has a history of drug abuse but that in and of itself does not necessitate involuntary commitment nor inpatient psychiatric treatment.  I consulted psychiatry and the patient was seen in person by Annice Pih with the behavioral health team.  We discussed the case in detail and she and I are in agreement that the patient does not meet IVC criteria and does not need inpatient treatment.  She had an extended conversation to obtain collateral from the patient's care coordinator and it sounds as if something more is going on behind the scenes interpersonally that is initially reported to law enforcement when they placed him under involuntary commitment.  Regardless, we agreed to overturn the IVC and that the patient can be discharged with his patient care coordinator, Mr. Raul Del, to follow-up as an outpatient with his existing resources.  The patient is happy with this plan and is in no distress at the time of discharge.         FINAL CLINICAL IMPRESSION(S) / ED DIAGNOSES   Final diagnoses:  Hyperglycemia due to diabetes mellitus     Rx / DC Orders   ED Discharge Orders     None        Note:  This document was prepared using Dragon voice recognition software and may include unintentional dictation errors.   Loleta Rose, MD 07/02/22 [phone]

## 2022-07-02 NOTE — ED Notes (Signed)
Steve Alvarado [phone] - assists pt w/ medical appointments and makes sure he gets the services pt needs.

## 2022-07-02 NOTE — ED Notes (Signed)
Psy bedside w/ pt.

## 2022-07-02 NOTE — Consult Note (Signed)
[facility] Face-to-Face Psychiatry Consult   Reason for Consult:  Referring Physician: Dr. York Cerise Patient Identification: Steve Alvarado MRN:  [id number] Principal Diagnosis: <principal problem not specified> Diagnosis:  Active Problems:   Essential hypertension   Cocaine abuse   Adjustment disorder with emotional disturbance   Depression   Type 2 diabetes mellitus with diabetic polyneuropathy, with long-term current use of insulin   Basal ganglia stroke   Total Time spent with patient: 1 hour  Subjective: "I don't want to hurt myself. I don't know where they getting that from. Steve Alvarado is a 67 y.o. male patient presented to [facility] ED, law enforcement brought in the patient under involuntary commitment (IVC) initiated by the patient's wife, citing concerns about suicidal ideation (SI), cocaine use, and poorly managed diabetes mellitus (DM). Both the patient and his caregiver adamantly denied the accusations, with the patient expressing frustration, believing his family was attempting to take his home. He recounted calling [facility] and the police previously for assistance, feeling abandoned until the officers brought him to the hospital that night.  During evaluation, the patient denied SI or HI, acknowledging cocaine use two months prior but denying frequent usage. He reported managing his blood glucose levels well at home and adhering to his medication regimen. Both the patient and his care coordinator highlighted ongoing family conflicts, suggesting a pattern of attempts to remove him from his home. The care coordinator cited various outpatient services and support resources utilized by the patient.  After a face-to-face assessment and consultation with Dr. York Cerise, it was determined that the patient did not meet criteria for admission to the psychiatric inpatient unit. The patient appeared alert, oriented, calm, and cooperative, with mood-congruent affect. He did not exhibit responses to  internal or external stimuli nor display delusional thinking, auditory or visual hallucinations, or psychotic or paranoid behaviors.   Collateral information from the patient's ex-wife was unavailable, but Steve Alvarado, identified as one of the patient's caregivers, reassured the team of the patient's well-being and refuted any suicidal or homicidal intentions. Steve Alvarado suggested that the patient's family's motives may involve coercing him into assisted living to gain access to his home, emphasizing the patient's solitary living arrangement. HPI:    Past Psychiatric History:  Depression  Risk to Self:   Risk to Others:   Prior Inpatient Therapy:   Prior Outpatient Therapy:    Past Medical History:  Past Medical History:  Diagnosis Date   Basal ganglia stroke    a. 07/2021 L sided wkns/facial droop/fall-->MRI brain: nonhemorrhagic infarct of posterior R lentiform nucleus and corona radiata measure. Remote lacunar infarcts of post cerebellum bilat and L thalamus.   CHF (congestive heart failure)    CKD (chronic kidney disease), stage II    a. 08/2021 AKI w/ creat up to 3.4.   Cocaine use    Coronary artery disease    a. 07/2010 PCI: LAD 88m, LCX 80 diff/small, OM1 54m, RCA 63m (3.0x18 Vision BMS). EF 50%; b. 07/2013 Cath: LM nl, LAD 30p, 20d, LCX 20d, LCX 100, OM1 60, RCA 30p, 93m, RPDA 90-->Med Rx; c. 10/2020 MV: EF 46% (60-65% by echo), no ischemia. 3V cor Ca2+.   Depression    Diabetes mellitus    Type II   Diastolic dysfunction    a. 10/2020 Echo: EF 60-65%, no rwma, mild LVH, GrI DD, nl RV size/fxn; b. 07/2021 Echo: EF 60-65%, no rwma, GrI DD, nl RV fxn, mildly dil LA, triv MR; c. 10/2021 Echo: EF 55-60%,  no rwma, GrI DD, nl RV fxn, mild-mod MR, Asc Ao 37mm.   Hyperlipidemia    Hypertension    Hypothyroidism     Past Surgical History:  Procedure Laterality Date   CARDIAC CATHETERIZATION  08/06/2010   Bare metal stent placed in RCA.   HAND SURGERY     left   Family History:   Family History  Problem Relation Age of Onset   Heart attack Mother    Family Psychiatric  History: History reviewed. No pertinent family psychiatric history. Social History:  Social History   Substance and Sexual Activity  Alcohol Use No     Social History   Substance and Sexual Activity  Drug Use Yes   Comment: crack    Social History   Socioeconomic History   Marital status: Legally Separated    Spouse name: Not on file   Number of children: Not on file   Years of education: Not on file   Highest education level: Not on file  Occupational History   Not on file  Tobacco Use   Smoking status: Never   Smokeless tobacco: Never  Vaping Use   Vaping Use: Never used  Substance and Sexual Activity   Alcohol use: No   Drug use: Yes    Comment: crack   Sexual activity: Not on file  Other Topics Concern   Not on file  Social History Narrative   Not on file   Social Determinants of Health   Financial Resource Strain: Not on file  Food Insecurity: Not on file  Transportation Needs: Not on file  Physical Activity: Not on file  Stress: Not on file  Social Connections: Not on file   Additional Social History:    Allergies:   Allergies  Allergen Reactions   Ace Inhibitors Cough    Reaction not listed on MAR    Labs:  Results for orders placed or performed during the hospital encounter of 07/01/22 (from the past 48 hour(s))  Comprehensive metabolic panel     Status: Abnormal   Collection Time: 07/02/22 12:10 AM  Result Value Ref Range   Sodium 132 (L) 135 - 145 mmol/L   Potassium 3.5 3.5 - 5.1 mmol/L   Chloride 95 (L) 98 - 111 mmol/L   CO2 25 22 - 32 mmol/L   Glucose, Bld 344 (H) 70 - 99 mg/dL    Comment: Glucose reference range applies only to samples taken after fasting for at least 8 hours.   BUN 16 8 - 23 mg/dL   Creatinine, Ser 1.61 0.61 - 1.24 mg/dL   Calcium 9.1 8.9 - 09.6 mg/dL   Total Protein 7.5 6.5 - 8.1 g/dL   Albumin 3.2 (L) 3.5 - 5.0 g/dL    AST 16 15 - 41 U/L   ALT 14 0 - 44 U/L   Alkaline Phosphatase 101 38 - 126 U/L   Total Bilirubin 0.7 0.3 - 1.2 mg/dL   GFR, Estimated >04 >54 mL/min    Comment: (NOTE) Calculated using the CKD-EPI Creatinine Equation (2021)    Anion gap 12 5 - 15    Comment: Performed at [facility], [address] [place], [place] [address]  Ethanol     Status: None   Collection Time: 07/02/22 12:10 AM  Result Value Ref Range   Alcohol, Ethyl (B) <10 <10 mg/dL    Comment: (NOTE) Lowest detectable limit for serum alcohol is 10 mg/dL.  For medical purposes only. Performed at [facility], [phone]  [address] [place], [place] [address]   Salicylate level     Status: Abnormal   Collection Time: 07/02/22 12:10 AM  Result Value Ref Range   Salicylate Lvl <7.0 (L) 7.0 - 30.0 mg/dL    Comment: Performed at [facility], [address] [place], [place] [address]  Acetaminophen level     Status: Abnormal   Collection Time: 07/02/22 12:10 AM  Result Value Ref Range   Acetaminophen (Tylenol), Serum <10 (L) 10 - 30 ug/mL    Comment: (NOTE) Therapeutic concentrations vary significantly. A range of 10-30 ug/mL  may be an effective concentration for many patients. However, some  are best treated at concentrations outside of this range. Acetaminophen concentrations >150 ug/mL at 4 hours after ingestion  and >50 ug/mL at 12 hours after ingestion are often associated with  toxic reactions.  Performed at [facility], [address] [place], [place] [address]   cbc     Status: Abnormal   Collection Time: 07/02/22 12:10 AM  Result Value Ref Range   WBC 6.4 4.0 - 10.5 K/uL   RBC 3.80 (L) 4.22 - 5.81 MIL/uL   Hemoglobin 10.8 (L) 13.0 - 17.0 g/dL   HCT 91.4 (L) 78.2 - 95.6 %   MCV 83.7 80.0 - 100.0 fL   MCH 28.4 26.0 - 34.0 pg   MCHC 34.0 30.0 - 36.0 g/dL   RDW 21.3 08.6 - 57.8 %   Platelets 301 150 - 400 K/uL   nRBC 0.0 0.0 - 0.2 %    Comment: Performed at  [facility], [address] [place], [place] [address]    No current facility-administered medications for this encounter.   Current Outpatient Medications  Medication Sig Dispense Refill   albuterol (VENTOLIN HFA) 108 (90 Base) MCG/ACT inhaler Inhale 2 puffs into the lungs every 6 (six) hours as needed for wheezing.     amLODipine (NORVASC) 10 MG tablet Take 1 tablet (10 mg total) by mouth every morning. 30 tablet 0   ASPERCREME LIDOCAINE 4 % Place 1 patch onto the skin See admin instructions. Apply 1 patch topically daily in the morning     aspirin EC 81 MG tablet Take 1 tablet (81 mg total) by mouth daily. Swallow whole. 30 tablet 12   atorvastatin (LIPITOR) 40 MG tablet Take 1 tablet (40 mg total) by mouth every evening. (Patient taking differently: Take 40 mg by mouth daily.) 30 tablet 0   carvedilol (COREG) 3.125 MG tablet Take 1 tablet (3.125 mg total) by mouth 2 (two) times daily with a meal. (Patient taking differently: Take 3.125 mg by mouth in the morning and at bedtime.) 60 tablet 0   citalopram (CELEXA) 20 MG tablet Take 1 tablet (20 mg total) by mouth daily. 30 tablet 0   clopidogrel (PLAVIX) 75 MG tablet Take 1 tablet (75 mg total) by mouth daily. (Patient taking differently: Take 75 mg by mouth at bedtime.) 17 tablet 0   DULoxetine (CYMBALTA) 60 MG capsule Take 60 mg by mouth daily.     ferrous sulfate 325 (65 FE) MG EC tablet Take 1 tablet (325 mg total) by mouth every other day. 60 tablet 3   furosemide (LASIX) 20 MG tablet Take 1 tablet (20 mg total) by mouth daily. 30 tablet 11   gabapentin (NEURONTIN) 400 MG capsule Take 400 mg by mouth 3 (three) times daily. 8am, 12pm 4pm     insulin lispro (HUMALOG) 100 UNIT/ML injection Inject 0.03 mLs (3 Units  total) into the skin 3 (three) times daily with meals. 10 mL 11   LANTUS SOLOSTAR 100 UNIT/ML Solostar Pen Inject 20 Units into the skin at bedtime.     levothyroxine (SYNTHROID) 75 MCG tablet Take 75 mcg by mouth at  bedtime. 2200     lidocaine 4 % Place 1 patch onto the skin daily. Apply one patch topically to left abdomen every morning - leave on for 12 hours. 12 hours on and 12 hours off.     melatonin 5 MG TABS Take 5 mg by mouth at bedtime.     methocarbamol (ROBAXIN) 500 MG tablet Take 500 mg by mouth 2 (two) times daily.     ondansetron (ZOFRAN-ODT) 4 MG disintegrating tablet Take 1 tablet (4 mg total) by mouth every 8 (eight) hours as needed. 20 tablet 0   pantoprazole (PROTONIX) 40 MG tablet Take 40 mg by mouth every morning.     polyethylene glycol (MIRALAX / GLYCOLAX) 17 g packet Take 17 g by mouth daily as needed for mild constipation. 14 each 0   potassium chloride (KLOR-CON M) 10 MEQ tablet Take 1 tablet (10 mEq total) by mouth 2 (two) times daily.     pregabalin (LYRICA) 50 MG capsule Take 50 mg by mouth 3 (three) times daily.     sacubitril-valsartan (ENTRESTO) 24-26 MG Take 1 tablet by mouth 2 (two) times daily. 60 tablet 3    Musculoskeletal: Strength & Muscle Tone: within normal limits Gait & Station: normal Patient leans: N/A  Psychiatric Specialty Exam:  Presentation  General Appearance:  Appropriate for Environment  Eye Contact: Good  Speech: Clear and Coherent  Speech Volume: Normal  Handedness: Right   Mood and Affect  Mood: Anxious  Affect: Depressed   Thought Process  Thought Processes: Coherent  Descriptions of Associations:Intact  Orientation:Full (Time, Place and Person)  Thought Content:Logical  History of Schizophrenia/Schizoaffective disorder:No data recorded Duration of Psychotic Symptoms:No data recorded Hallucinations:Hallucinations: None  Ideas of Reference:None  Suicidal Thoughts:Suicidal Thoughts: No  Homicidal Thoughts:Homicidal Thoughts: No   Sensorium  Memory: Immediate Good; Recent Good; Remote Good  Judgment: Good  Insight: Good   Executive Functions  Concentration: Good  Attention  Span: Good  Recall: Good  Fund of Knowledge: Good  Language: Good   Psychomotor Activity  Psychomotor Activity: Psychomotor Activity: Decreased   Assets  Assets: Communication Skills; Desire for Improvement; Resilience; Social Support   Sleep  Sleep: Sleep: Good   Physical Exam: Physical Exam Vitals and nursing note reviewed.  Constitutional:      Appearance: Normal appearance.  HENT:     Head: Normocephalic and atraumatic.     Right Ear: External ear normal.     Left Ear: External ear normal.     Nose: Nose normal.  Cardiovascular:     Rate and Rhythm: Normal rate.     Pulses: Normal pulses.  Pulmonary:     Effort: Pulmonary effort is normal.  Musculoskeletal:        General: Tenderness present.     Cervical back: Normal range of motion and neck supple.  Neurological:     General: No focal deficit present.     Mental Status: He is alert and oriented to person, place, and time.  Psychiatric:        Attention and Perception: Attention and perception normal.        Mood and Affect: Mood is anxious and depressed.        Speech: Speech normal.  Behavior: Behavior normal. Behavior is cooperative.        Thought Content: Thought content normal.        Cognition and Memory: Cognition normal.        Judgment: Judgment normal.    Review of Systems  Psychiatric/Behavioral:  Positive for depression. The patient is nervous/anxious.   All other systems reviewed and are negative.  Blood pressure (!) 149/90, pulse 88, temperature 98.2 F (36.8 C), temperature source Oral, resp. rate 20, SpO2 100 %. There is no height or weight on file to calculate BMI.  Treatment Plan Summary: Plan   Patient does not meet criteria for geriatric-psychiatric inpatient admission  Disposition: No evidence of imminent risk to self or others at present.   Patient does not meet criteria for psychiatric inpatient admission. Supportive therapy provided about ongoing  stressors. Discussed crisis plan, support from social network, calling 911, coming to the Emergency Department, and calling Suicide Hotline.  Gillermo Murdoch, NP 07/02/2022 2:17 AM

## 2022-07-02 NOTE — Discharge Instructions (Signed)
You were evaluated both by the emergency department physician and the psychiatrist tonight and there is no indication that you need to stay in the hospital or in the psychiatric facility.  It is important that you take your regular medications including your diabetes medicine and follow-up with your regular doctor at the next available opportunity.  Is continue to see your mental health [facility] medicine providers as well.  Return to the emergency department if you develop new or worsening symptoms that concern you.

## 2022-07-24 ENCOUNTER — Other Ambulatory Visit: Payer: Self-pay

## 2022-07-24 ENCOUNTER — Inpatient Hospital Stay
Admission: EM | Admit: 2022-07-24 | Discharge: 2022-07-28 | DRG: 641 | Disposition: A | Discharge status: Skilled Nursing Facility | Payer: 59 | Attending: Student | Admitting: Student

## 2022-07-24 DIAGNOSIS — E872 Acidosis, unspecified: Secondary | ICD-10-CM | POA: Diagnosis present

## 2022-07-24 DIAGNOSIS — R197 Diarrhea, unspecified: Secondary | ICD-10-CM | POA: Diagnosis present

## 2022-07-24 DIAGNOSIS — Z7902 Long term (current) use of antithrombotics/antiplatelets: Secondary | ICD-10-CM

## 2022-07-24 DIAGNOSIS — D638 Anemia in other chronic diseases classified elsewhere: Secondary | ICD-10-CM | POA: Diagnosis present

## 2022-07-24 DIAGNOSIS — N182 Chronic kidney disease, stage 2 (mild): Secondary | ICD-10-CM | POA: Diagnosis present

## 2022-07-24 DIAGNOSIS — I69354 Hemiplegia and hemiparesis following cerebral infarction affecting left non-dominant side: Secondary | ICD-10-CM

## 2022-07-24 DIAGNOSIS — F32A Depression, unspecified: Secondary | ICD-10-CM | POA: Diagnosis present

## 2022-07-24 DIAGNOSIS — I5032 Chronic diastolic (congestive) heart failure: Secondary | ICD-10-CM | POA: Diagnosis present

## 2022-07-24 DIAGNOSIS — E1122 Type 2 diabetes mellitus with diabetic chronic kidney disease: Secondary | ICD-10-CM | POA: Diagnosis present

## 2022-07-24 DIAGNOSIS — I130 Hypertensive heart and chronic kidney disease with heart failure and stage 1 through stage 4 chronic kidney disease, or unspecified chronic kidney disease: Secondary | ICD-10-CM | POA: Diagnosis present

## 2022-07-24 DIAGNOSIS — Z91148 Patient's other noncompliance with medication regimen for other reason: Secondary | ICD-10-CM

## 2022-07-24 DIAGNOSIS — I440 Atrioventricular block, first degree: Secondary | ICD-10-CM | POA: Diagnosis present

## 2022-07-24 DIAGNOSIS — I2510 Atherosclerotic heart disease of native coronary artery without angina pectoris: Secondary | ICD-10-CM | POA: Diagnosis present

## 2022-07-24 DIAGNOSIS — E1140 Type 2 diabetes mellitus with diabetic neuropathy, unspecified: Secondary | ICD-10-CM | POA: Diagnosis present

## 2022-07-24 DIAGNOSIS — E1165 Type 2 diabetes mellitus with hyperglycemia: Secondary | ICD-10-CM | POA: Diagnosis present

## 2022-07-24 DIAGNOSIS — G8929 Other chronic pain: Secondary | ICD-10-CM | POA: Diagnosis present

## 2022-07-24 DIAGNOSIS — N189 Chronic kidney disease, unspecified: Secondary | ICD-10-CM | POA: Diagnosis present

## 2022-07-24 DIAGNOSIS — N179 Acute kidney failure, unspecified: Secondary | ICD-10-CM | POA: Diagnosis present

## 2022-07-24 DIAGNOSIS — E785 Hyperlipidemia, unspecified: Secondary | ICD-10-CM | POA: Diagnosis present

## 2022-07-24 DIAGNOSIS — E860 Dehydration: Principal | ICD-10-CM | POA: Diagnosis present

## 2022-07-24 DIAGNOSIS — Z8249 Family history of ischemic heart disease and other diseases of the circulatory system: Secondary | ICD-10-CM

## 2022-07-24 DIAGNOSIS — N289 Disorder of kidney and ureter, unspecified: Secondary | ICD-10-CM

## 2022-07-24 DIAGNOSIS — B9620 Unspecified Escherichia coli [E. coli] as the cause of diseases classified elsewhere: Secondary | ICD-10-CM | POA: Diagnosis present

## 2022-07-24 DIAGNOSIS — N390 Urinary tract infection, site not specified: Secondary | ICD-10-CM | POA: Diagnosis present

## 2022-07-24 DIAGNOSIS — Z794 Long term (current) use of insulin: Secondary | ICD-10-CM

## 2022-07-24 DIAGNOSIS — Z79899 Other long term (current) drug therapy: Secondary | ICD-10-CM

## 2022-07-24 DIAGNOSIS — I10 Essential (primary) hypertension: Secondary | ICD-10-CM | POA: Diagnosis present

## 2022-07-24 DIAGNOSIS — Z7982 Long term (current) use of aspirin: Secondary | ICD-10-CM

## 2022-07-24 DIAGNOSIS — Z79890 Hormone replacement therapy (postmenopausal): Secondary | ICD-10-CM

## 2022-07-24 DIAGNOSIS — R112 Nausea with vomiting, unspecified: Secondary | ICD-10-CM | POA: Diagnosis not present

## 2022-07-24 DIAGNOSIS — N1831 Chronic kidney disease, stage 3a: Secondary | ICD-10-CM | POA: Diagnosis present

## 2022-07-24 DIAGNOSIS — E039 Hypothyroidism, unspecified: Secondary | ICD-10-CM | POA: Diagnosis present

## 2022-07-24 DIAGNOSIS — K219 Gastro-esophageal reflux disease without esophagitis: Secondary | ICD-10-CM | POA: Diagnosis present

## 2022-07-24 DIAGNOSIS — R739 Hyperglycemia, unspecified: Secondary | ICD-10-CM

## 2022-07-24 DIAGNOSIS — K2100 Gastro-esophageal reflux disease with esophagitis, without bleeding: Secondary | ICD-10-CM | POA: Diagnosis present

## 2022-07-24 LAB — CBG MONITORING, ED: Glucose-Capillary: 364 mg/dL — ABNORMAL HIGH (ref 70–99)

## 2022-07-24 NOTE — ED Notes (Signed)
Pt states he wants to go to [facility].

## 2022-07-24 NOTE — ED Provider Notes (Signed)
[facility] Provider Note    Event Date/Time   First MD Initiated Contact with Patient 07/24/22 2336     (approximate)   History   Hyperglycemia   HPI  Steve Alvarado is a 67 y.o. male to the ED via EMS from home with a chief complaint of hyperglycemia and requesting nursing home placement.  Patient with a history of CVA with left-sided paraplegia, uses wheelchair, CKD, hypertension, cocaine use, hyperlipidemia who states he has not been taking his medications for the past week.  He is by himself and would like nursing home placement as he feels like he can no longer care for himself.  Denies fever, chest pain, shortness of breath, abdominal pain, nausea, vomiting or dizziness.     Past Medical History   Past Medical History:  Diagnosis Date   Basal ganglia stroke (HCC)    a. 07/2021 L sided wkns/facial droop/fall-->MRI brain: nonhemorrhagic infarct of posterior R lentiform nucleus and corona radiata measure. Remote lacunar infarcts of post cerebellum bilat and L thalamus.   CHF (congestive heart failure) (HCC)    CKD (chronic kidney disease), stage II    a. 08/2021 AKI w/ creat up to 3.4.   Cocaine use    Coronary artery disease    a. 07/2010 PCI: LAD 34m, LCX 80 diff/small, OM1 67m, RCA 87m (3.0x18 Vision BMS). EF 50%; b. 07/2013 Cath: LM nl, LAD 30p, 20d, LCX 20d, LCX 100, OM1 60, RCA 30p, 94m, RPDA 90-->Med Rx; c. 10/2020 MV: EF 46% (60-65% by echo), no ischemia. 3V cor Ca2+.   Depression    Diabetes mellitus    Type II   Diastolic dysfunction    a. 10/2020 Echo: EF 60-65%, no rwma, mild LVH, GrI DD, nl RV size/fxn; b. 07/2021 Echo: EF 60-65%, no rwma, GrI DD, nl RV fxn, mildly dil LA, triv MR; c. 10/2021 Echo: EF 55-60%, no rwma, GrI DD, nl RV fxn, mild-mod MR, Asc Ao 37mm.   Hyperlipidemia    Hypertension    Hypothyroidism      Active Problem List   Patient Active Problem List   Diagnosis Date Noted   Demand ischemia    Urinary tract infection  11/08/2021   NSTEMI, Type ll (non-ST elevated myocardial infarction) (HCC) 11/07/2021   Acute on chronic heart failure with preserved ejection fraction (HFpEF) (HCC) 11/07/2021   Hematuria 11/07/2021   Acute on chronic blood loss anemia 11/07/2021   Hemiparesis affecting left side as late effect of cerebrovascular accident (CVA) (HCC) 11/07/2021   Acute respiratory failure with hypoxia (HCC) 11/07/2021   AKI (acute kidney injury) (HCC) 08/21/2021   Acute kidney injury (HCC) 08/20/2021   GERD without esophagitis 08/20/2021   Basal ganglia stroke (HCC) 08/12/2021   Type 2 diabetes mellitus with diabetic polyneuropathy, with long-term current use of insulin (HCC) 07/09/2021   Hypothyroidism 07/09/2021   Depression 10/21/2020   Non compliance w medication regimen 10/21/2020   Cocaine abuse (HCC) 09/05/2020   Adjustment disorder with emotional disturbance 09/05/2020   Essential hypertension 02/14/2011   Coronary artery disease involving native coronary artery of native heart without angina pectoris 08/13/2010   Stented coronary artery 08/13/2010     Past Surgical History   Past Surgical History:  Procedure Laterality Date   CARDIAC CATHETERIZATION  08/06/2010   Bare metal stent placed in RCA.   HAND SURGERY     left     Home Medications   Prior to Admission medications   Medication Sig  Start Date End Date Taking? Authorizing Provider  albuterol (VENTOLIN HFA) 108 (90 Base) MCG/ACT inhaler Inhale 2 puffs into the lungs every 6 (six) hours as needed for wheezing.   Yes [provider]  amLODipine (NORVASC) 10 MG tablet Take 1 tablet (10 mg total) by mouth every morning. 08/19/21  Yes de Saintclair Halsted, Cortney E, NP  aspirin EC 81 MG tablet Take 1 tablet (81 mg total) by mouth daily. Swallow whole. 08/18/21  Yes Shafer, Ludger Nutting, NP  atorvastatin (LIPITOR) 40 MG tablet Take 1 tablet (40 mg total) by mouth every evening. Patient taking differently: Take 40 mg by mouth daily. 10/23/20   Yes Delfino Lovett, MD  carvedilol (COREG) 3.125 MG tablet Take 1 tablet (3.125 mg total) by mouth 2 (two) times daily with a meal. Patient taking differently: Take 3.125 mg by mouth in the morning and at bedtime. 10/23/20  Yes Delfino Lovett, MD  citalopram (CELEXA) 20 MG tablet Take 1 tablet (20 mg total) by mouth daily. 10/23/20  Yes Delfino Lovett, MD  clopidogrel (PLAVIX) 75 MG tablet Take 1 tablet (75 mg total) by mouth daily. Patient taking differently: Take 75 mg by mouth at bedtime. 08/18/21  Yes Shafer, Ludger Nutting, NP  DULoxetine (CYMBALTA) 30 MG capsule Take 60 mg by mouth daily.   Yes [provider]  ferrous sulfate 325 (65 FE) MG EC tablet Take 1 tablet (325 mg total) by mouth every other day. 11/09/21 11/09/22 Yes Wouk, Wilfred Curtis, MD  furosemide (LASIX) 20 MG tablet Take 1 tablet (20 mg total) by mouth daily. 11/09/21 11/09/22 Yes Wouk, Wilfred Curtis, MD  insulin lispro (HUMALOG) 100 UNIT/ML injection Inject 0.03 mLs (3 Units total) into the skin 3 (three) times daily with meals. 10/23/20  Yes Delfino Lovett, MD  LANTUS SOLOSTAR 100 UNIT/ML Solostar Pen Inject 20 Units into the skin at bedtime. 09/14/21  Yes [provider]  levothyroxine (SYNTHROID) 75 MCG tablet Take 75 mcg by mouth at bedtime. 2200 05/19/21  Yes [provider]  lidocaine 4 % Place 1 patch onto the skin daily. Apply one patch topically to left abdomen every morning - leave on for 12 hours. 12 hours on and 12 hours off.   Yes [provider]  melatonin 5 MG TABS Take 5 mg by mouth at bedtime.   Yes [provider]  polyethylene glycol (MIRALAX / GLYCOLAX) 17 g packet Take 17 g by mouth daily as needed for mild constipation. 08/22/21  Yes Dahal, Melina Schools, MD  pregabalin (LYRICA) 50 MG capsule Take 50 mg by mouth 3 (three) times daily. 01/24/22  Yes [provider]  sacubitril-valsartan (ENTRESTO) 24-26 MG Take 1 tablet by mouth 2 (two) times daily. 11/29/21  Yes Hackney, Tina A, FNP  ASPERCREME  LIDOCAINE 4 % Place 1 patch onto the skin See admin instructions. Apply 1 patch topically daily in the morning Patient not taking: Reported on 07/25/2022 07/12/21   [provider]     Allergies  Ace inhibitors   Family History   Family History  Problem Relation Age of Onset   Heart attack Mother      Physical Exam  Triage Vital Signs: ED Triage Vitals  Enc Vitals Group     BP 07/24/22 1940 129/64     Pulse Rate 07/24/22 1940 85     Resp 07/24/22 1940 20     Temp 07/24/22 1940 98.9 F (37.2 C)     Temp Source 07/24/22 1940 Oral     SpO2  07/24/22 1940 100 %     Weight 07/24/22 1941 190 lb (86.2 kg)     Height 07/24/22 1941 6\' 2"  (1.88 m)     Head Circumference --      Peak Flow --      Pain Score 07/24/22 1940 8     Pain Loc --      Pain Edu? --      Excl. in GC? --     Updated Vital Signs: BP (!) 151/85   Pulse 81   Temp 98.8 F (37.1 C)   Resp (!) 21   Ht 6\' 2"  (1.88 m)   Wt 86.2 kg   SpO2 99%   BMI 24.39 kg/m    General: Awake, no distress.  CV:  RRR.  Good peripheral perfusion.  Resp:  Normal effort.  CTAB. Abd:  Nontender.  No distention.  Other:  Baseline left-sided deficits.   ED Results / Procedures / Treatments  Labs (all labs ordered are listed, but only abnormal results are displayed) Labs Reviewed  BASIC METABOLIC PANEL - Abnormal; Notable for the following components:      Result Value   Sodium 130 (*)    Chloride 96 (*)    CO2 19 (*)    Glucose, Bld 465 (*)    BUN 25 (*)    Creatinine, Ser 1.62 (*)    GFR, Estimated 46 (*)    All other components within normal limits  CBC - Abnormal; Notable for the following components:   RBC 3.92 (*)    Hemoglobin 11.2 (*)    HCT 34.6 (*)    All other components within normal limits  CBG MONITORING, ED - Abnormal; Notable for the following components:   Glucose-Capillary 364 (*)    All other components within normal limits  CBG MONITORING, ED - Abnormal; Notable for the following  components:   Glucose-Capillary 442 (*)    All other components within normal limits  URINALYSIS, ROUTINE W REFLEX MICROSCOPIC  CBG MONITORING, ED  CBG MONITORING, ED  CBG MONITORING, ED  CBG MONITORING, ED  TROPONIN I (HIGH SENSITIVITY)     EKG  ED ECG REPORT I, Laketra Bowdish J, the attending physician, personally viewed and interpreted this ECG.   Date: 07/25/2022  EKG Time: 0132  Rate: 88  Rhythm: normal sinus rhythm  Axis: Normal  Intervals:none  ST&T Change: Nonspecific    RADIOLOGY I have independently visualized and interpreted patient's x-ray as well as noted the radiology interpretation:  X-ray: No acute cardiopulmonary process  Official radiology report(s): DG Chest 1 View  Result Date: 07/25/2022 CLINICAL DATA:  Weakness EXAM: CHEST  1 VIEW COMPARISON:  04/01/2022 FINDINGS: The heart size and mediastinal contours are within normal limits. Both lungs are clear. The visualized skeletal structures are unremarkable. IMPRESSION: No active disease. Electronically Signed   By: Charlett Nose M.D.   On: 07/25/2022 00:30     PROCEDURES:  Critical Care performed: No  Procedures   MEDICATIONS ORDERED IN ED: Medications  amLODipine (NORVASC) tablet 10 mg (has no administration in time range)  aspirin EC tablet 81 mg (has no administration in time range)  atorvastatin (LIPITOR) tablet 40 mg (has no administration in time range)  carvedilol (COREG) tablet 3.125 mg (has no administration in time range)  citalopram (CELEXA) tablet 20 mg (has no administration in time range)  clopidogrel (PLAVIX) tablet 75 mg (has no administration in time range)  DULoxetine (CYMBALTA) DR capsule 60 mg (has no administration in time  range)  ferrous sulfate tablet 325 mg (has no administration in time range)  furosemide (LASIX) tablet 20 mg (has no administration in time range)  insulin aspart (novoLOG) injection 3 Units (has no administration in time range)  levothyroxine (SYNTHROID)  tablet 75 mcg (has no administration in time range)  lidocaine (LIDODERM) 5 % 1 patch (has no administration in time range)  melatonin tablet 5 mg (has no administration in time range)  polyethylene glycol (MIRALAX / GLYCOLAX) packet 17 g (has no administration in time range)  pregabalin (LYRICA) capsule 50 mg (has no administration in time range)  sacubitril-valsartan (ENTRESTO) 24-26 mg per tablet (has no administration in time range)  insulin glargine-yfgn (SEMGLEE) injection 20 Units (has no administration in time range)  albuterol (PROVENTIL) (2.5 MG/3ML) 0.083% nebulizer solution 2.5 mg (has no administration in time range)  sodium chloride 0.9 % bolus 1,000 mL (1,000 mLs Intravenous New Bag/Given 07/25/22 0040)  insulin aspart (novoLOG) injection 8 Units (8 Units Subcutaneous Given 07/25/22 0141)     IMPRESSION / MDM / ASSESSMENT AND PLAN / ED COURSE  I reviewed the triage vital signs and the nursing notes.                             67 year old male presenting with hyperglycemia and requesting nursing home placement.  Differential diagnosis includes but is not limited to medication nonadherence, infectious, metabolic etiologies, etc.  I have personally reviewed patient's records and note last PCP office visit from 06/03/2022 for diabetes maintenance.  Patient's presentation is most consistent with acute presentation with potential threat to life or bodily function.  Will obtain basic lab work, chest x-ray, UA.  FSBS 360s.  Administer IV fluid hydration.  Will reassess.  Clinical Course as of 07/25/22 0528  Mon Jul 25, 2022  1610 Laboratory results demonstrate hyperglycemia without elevation of anion gap.  AKI compared to prior labs.  Will add subcu insulin.  Will place patient in boarder status. [JS]  [id number] Ordered patient's home medications.  Patient remains in the emergency department awaiting social work consult for SNF placement. [JS]    Clinical Course User Index [JS] Irean Hong, MD     FINAL CLINICAL IMPRESSION(S) / ED DIAGNOSES   Final diagnoses:  Hyperglycemia  Renal insufficiency     Rx / DC Orders   ED Discharge Orders     None        Note:  This document was prepared using Dragon voice recognition software and may include unintentional dictation errors.   Irean Hong, MD 07/25/22 [id number]

## 2022-07-24 NOTE — ED Notes (Signed)
Pt refusing labs at this time and states he wants to wait until he can have an [place] IV.

## 2022-07-24 NOTE — ED Triage Notes (Addendum)
FIRST NURSE NOTE:  Pt arrived via [facility] from home with reports of high blood sugar, wants to come to hospital, wants placement  CBG with EMS was 307, not taking meds x 1 week,  L side deficit from CVA.   100% RA 125/70 99 temp

## 2022-07-24 NOTE — ED Triage Notes (Signed)
Pt presents with elevated blood sugar. Pt stating he would like help to be placed in a care facility.

## 2022-07-25 ENCOUNTER — Emergency Department: Payer: 59

## 2022-07-25 LAB — BASIC METABOLIC PANEL
Anion gap: 15 (ref 5–15)
BUN: 25 mg/dL — ABNORMAL HIGH (ref 8–23)
CO2: 19 mmol/L — ABNORMAL LOW (ref 22–32)
Calcium: 8.9 mg/dL (ref 8.9–10.3)
Chloride: 96 mmol/L — ABNORMAL LOW (ref 98–111)
Creatinine, Ser: 1.62 mg/dL — ABNORMAL HIGH (ref 0.61–1.24)
GFR, Estimated: 46 mL/min — ABNORMAL LOW (ref >60)
Glucose, Bld: 465 mg/dL — ABNORMAL HIGH (ref 70–99)
Potassium: 4.3 mmol/L (ref 3.5–5.1)
Sodium: 130 mmol/L — ABNORMAL LOW (ref 135–145)

## 2022-07-25 LAB — CBC
HCT: 34.6 % — ABNORMAL LOW (ref 39.0–52.0)
Hemoglobin: 11.2 g/dL — ABNORMAL LOW (ref 13.0–17.0)
MCH: 28.6 pg (ref 26.0–34.0)
MCHC: 32.4 g/dL (ref 30.0–36.0)
MCV: 88.3 fL (ref 80.0–100.0)
Platelets: 222 10*3/uL (ref 150–400)
RBC: 3.92 MIL/uL — ABNORMAL LOW (ref 4.22–5.81)
RDW: 12.7 % (ref 11.5–15.5)
WBC: 6.4 10*3/uL (ref 4.0–10.5)
nRBC: 0 % (ref 0.0–0.2)

## 2022-07-25 LAB — TROPONIN I (HIGH SENSITIVITY): Troponin I (High Sensitivity): 11 ng/L (ref <18)

## 2022-07-25 LAB — CBC WITH DIFFERENTIAL/PLATELET
Abs Immature Granulocytes: 0.09 10*3/uL — ABNORMAL HIGH (ref 0.00–0.07)
Basophils Absolute: 0 10*3/uL (ref 0.0–0.1)
Basophils Relative: 0 %
Eosinophils Absolute: 0.3 10*3/uL (ref 0.0–0.5)
Eosinophils Relative: 4 %
HCT: 33.2 % — ABNORMAL LOW (ref 39.0–52.0)
Hemoglobin: 11 g/dL — ABNORMAL LOW (ref 13.0–17.0)
Immature Granulocytes: 1 %
Lymphocytes Relative: 29 %
Lymphs Abs: 2.3 10*3/uL (ref 0.7–4.0)
MCH: 28.4 pg (ref 26.0–34.0)
MCHC: 33.1 g/dL (ref 30.0–36.0)
MCV: 85.8 fL (ref 80.0–100.0)
Monocytes Absolute: 0.7 10*3/uL (ref 0.1–1.0)
Monocytes Relative: 9 %
Neutro Abs: 4.6 10*3/uL (ref 1.7–7.7)
Neutrophils Relative %: 57 %
Platelets: 311 10*3/uL (ref 150–400)
RBC: 3.87 MIL/uL — ABNORMAL LOW (ref 4.22–5.81)
RDW: 12.7 % (ref 11.5–15.5)
WBC: 7.9 10*3/uL (ref 4.0–10.5)
nRBC: 0 % (ref 0.0–0.2)

## 2022-07-25 LAB — COMPREHENSIVE METABOLIC PANEL
ALT: 9 U/L (ref 0–44)
AST: 15 U/L (ref 15–41)
Albumin: 3.1 g/dL — ABNORMAL LOW (ref 3.5–5.0)
Alkaline Phosphatase: 90 U/L (ref 38–126)
Anion gap: 11 (ref 5–15)
BUN: 23 mg/dL (ref 8–23)
CO2: 24 mmol/L (ref 22–32)
Calcium: 8.8 mg/dL — ABNORMAL LOW (ref 8.9–10.3)
Chloride: 101 mmol/L (ref 98–111)
Creatinine, Ser: 1.19 mg/dL (ref 0.61–1.24)
GFR, Estimated: >60 mL/min (ref >60)
Glucose, Bld: 264 mg/dL — ABNORMAL HIGH (ref 70–99)
Potassium: 3.7 mmol/L (ref 3.5–5.1)
Sodium: 136 mmol/L (ref 135–145)
Total Bilirubin: 0.5 mg/dL (ref 0.3–1.2)
Total Protein: 7.6 g/dL (ref 6.5–8.1)

## 2022-07-25 LAB — CBG MONITORING, ED
Glucose-Capillary: 220 mg/dL — ABNORMAL HIGH (ref 70–99)
Glucose-Capillary: 234 mg/dL — ABNORMAL HIGH (ref 70–99)
Glucose-Capillary: 237 mg/dL — ABNORMAL HIGH (ref 70–99)
Glucose-Capillary: 237 mg/dL — ABNORMAL HIGH (ref 70–99)
Glucose-Capillary: 254 mg/dL — ABNORMAL HIGH (ref 70–99)
Glucose-Capillary: 266 mg/dL — ABNORMAL HIGH (ref 70–99)
Glucose-Capillary: 442 mg/dL — ABNORMAL HIGH (ref 70–99)

## 2022-07-25 MED ORDER — INSULIN ASPART 100 UNIT/ML IJ SOLN
3.0000 [IU] | Freq: Three times a day (TID) | INTRAMUSCULAR | Status: DC
  Administered 2022-07-25: 3 [IU] via SUBCUTANEOUS
  Filled 2022-07-25: qty 1

## 2022-07-25 MED ORDER — ASPIRIN 81 MG PO TBEC
81.0000 mg | DELAYED_RELEASE_TABLET | Freq: Every day | ORAL | Status: DC
  Administered 2022-07-25 – 2022-07-28 (×4): 81 mg via ORAL
  Filled 2022-07-25 (×4): qty 1

## 2022-07-25 MED ORDER — LIDOCAINE 5 % EX PTCH
1.0000 | MEDICATED_PATCH | CUTANEOUS | Status: DC
  Administered 2022-07-25: 1 via TRANSDERMAL
  Filled 2022-07-25 (×3): qty 1

## 2022-07-25 MED ORDER — ONDANSETRON 4 MG PO TBDP
4.0000 mg | ORAL_TABLET | Freq: Three times a day (TID) | ORAL | Status: DC | PRN
  Administered 2022-07-25: 4 mg via ORAL
  Filled 2022-07-25: qty 1

## 2022-07-25 MED ORDER — PREGABALIN 50 MG PO CAPS
50.0000 mg | ORAL_CAPSULE | Freq: Three times a day (TID) | ORAL | Status: DC
  Administered 2022-07-25 – 2022-07-28 (×10): 50 mg via ORAL
  Filled 2022-07-25 (×10): qty 1

## 2022-07-25 MED ORDER — DULOXETINE HCL 30 MG PO CPEP
60.0000 mg | ORAL_CAPSULE | Freq: Every day | ORAL | Status: DC
  Administered 2022-07-25 – 2022-07-28 (×4): 60 mg via ORAL
  Filled 2022-07-25 (×2): qty 1
  Filled 2022-07-25 (×2): qty 2

## 2022-07-25 MED ORDER — CARVEDILOL 6.25 MG PO TABS
3.1250 mg | ORAL_TABLET | Freq: Two times a day (BID) | ORAL | Status: DC
  Administered 2022-07-25 – 2022-07-28 (×7): 3.125 mg via ORAL
  Filled 2022-07-25 (×7): qty 1

## 2022-07-25 MED ORDER — CLOPIDOGREL BISULFATE 75 MG PO TABS
75.0000 mg | ORAL_TABLET | Freq: Every day | ORAL | Status: DC
  Administered 2022-07-25 – 2022-07-27 (×3): 75 mg via ORAL
  Filled 2022-07-25 (×3): qty 1

## 2022-07-25 MED ORDER — MELATONIN 5 MG PO TABS
5.0000 mg | ORAL_TABLET | Freq: Every day | ORAL | Status: DC
  Administered 2022-07-25 – 2022-07-27 (×3): 5 mg via ORAL
  Filled 2022-07-25 (×3): qty 1

## 2022-07-25 MED ORDER — POLYETHYLENE GLYCOL 3350 17 G PO PACK: 17.0000 g | PACK | Freq: Every day | ORAL | Status: DC | PRN

## 2022-07-25 MED ORDER — CITALOPRAM HYDROBROMIDE 20 MG PO TABS
20.0000 mg | ORAL_TABLET | Freq: Every day | ORAL | Status: DC
  Administered 2022-07-25 – 2022-07-28 (×4): 20 mg via ORAL
  Filled 2022-07-25 (×4): qty 1

## 2022-07-25 MED ORDER — AMLODIPINE BESYLATE 10 MG PO TABS
10.0000 mg | ORAL_TABLET | Freq: Every morning | ORAL | Status: DC
  Administered 2022-07-25 – 2022-07-28 (×4): 10 mg via ORAL
  Filled 2022-07-25: qty 1
  Filled 2022-07-25: qty 2
  Filled 2022-07-25: qty 1
  Filled 2022-07-25: qty 2

## 2022-07-25 MED ORDER — IOHEXOL 300 MG/ML  SOLN
100.0000 mL | Freq: Once | INTRAMUSCULAR | Status: AC | PRN
  Administered 2022-07-25: 100 mL via INTRAVENOUS

## 2022-07-25 MED ORDER — ALBUTEROL SULFATE HFA 108 (90 BASE) MCG/ACT IN AERS: 2.0000 | INHALATION_SPRAY | Freq: Four times a day (QID) | RESPIRATORY_TRACT | Status: DC | PRN

## 2022-07-25 MED ORDER — INSULIN GLARGINE-YFGN 100 UNIT/ML ~~LOC~~ SOLN
20.0000 [IU] | Freq: Every day | SUBCUTANEOUS | Status: DC
  Administered 2022-07-25 – 2022-07-27 (×3): 20 [IU] via SUBCUTANEOUS
  Filled 2022-07-25 (×4): qty 0.2

## 2022-07-25 MED ORDER — SACUBITRIL-VALSARTAN 24-26 MG PO TABS
1.0000 | ORAL_TABLET | Freq: Two times a day (BID) | ORAL | Status: DC
  Administered 2022-07-25 – 2022-07-28 (×7): 1 via ORAL
  Filled 2022-07-25 (×7): qty 1

## 2022-07-25 MED ORDER — INSULIN ASPART 100 UNIT/ML IJ SOLN
0.0000 [IU] | Freq: Every day | INTRAMUSCULAR | Status: DC
  Administered 2022-07-25: 3 [IU] via SUBCUTANEOUS
  Filled 2022-07-25: qty 1

## 2022-07-25 MED ORDER — ATORVASTATIN CALCIUM 20 MG PO TABS
40.0000 mg | ORAL_TABLET | Freq: Every day | ORAL | Status: DC
  Administered 2022-07-25 – 2022-07-28 (×4): 40 mg via ORAL
  Filled 2022-07-25 (×4): qty 2

## 2022-07-25 MED ORDER — INSULIN ASPART 100 UNIT/ML IJ SOLN
0.0000 [IU] | Freq: Three times a day (TID) | INTRAMUSCULAR | Status: DC
  Administered 2022-07-25: 5 [IU] via SUBCUTANEOUS
  Administered 2022-07-25: 3 [IU] via SUBCUTANEOUS
  Filled 2022-07-25: qty 1

## 2022-07-25 MED ORDER — ALBUTEROL SULFATE (2.5 MG/3ML) 0.083% IN NEBU: 2.5000 mg | INHALATION_SOLUTION | Freq: Four times a day (QID) | RESPIRATORY_TRACT | Status: DC | PRN

## 2022-07-25 MED ORDER — SODIUM CHLORIDE 0.9 % IV BOLUS
1000.0000 mL | Freq: Once | INTRAVENOUS | Status: AC
  Administered 2022-07-25: 1000 mL via INTRAVENOUS

## 2022-07-25 MED ORDER — LEVOTHYROXINE SODIUM 50 MCG PO TABS
75.0000 ug | ORAL_TABLET | Freq: Every day | ORAL | Status: DC
  Administered 2022-07-25 – 2022-07-27 (×3): 75 ug via ORAL
  Filled 2022-07-25 (×3): qty 2

## 2022-07-25 MED ORDER — FERROUS SULFATE 325 (65 FE) MG PO TABS
325.0000 mg | ORAL_TABLET | ORAL | Status: DC
  Administered 2022-07-25 – 2022-07-27 (×2): 325 mg via ORAL
  Filled 2022-07-25 (×3): qty 1

## 2022-07-25 MED ORDER — INSULIN ASPART 100 UNIT/ML IJ SOLN
8.0000 [IU] | Freq: Once | INTRAMUSCULAR | Status: AC
  Administered 2022-07-25: 8 [IU] via SUBCUTANEOUS
  Filled 2022-07-25: qty 1

## 2022-07-25 MED ORDER — FUROSEMIDE 20 MG PO TABS
20.0000 mg | ORAL_TABLET | Freq: Every day | ORAL | Status: DC
  Administered 2022-07-25 – 2022-07-27 (×3): 20 mg via ORAL
  Filled 2022-07-25 (×3): qty 1

## 2022-07-25 NOTE — TOC Initial Note (Signed)
Transition of Care [facility]) - Initial/Assessment Note    Patient Details  Name: Steve Alvarado MRN: [id number] Date of Birth: 1956/03/10  [facility] CM/SW Contact:    Margarito Liner, LCSW Phone Number: 07/25/2022, 1:22 PM  Clinical Narrative: CSW met with patient. No supports at bedside. CSW introduced role and explained that therapy recommendations would be discussed. Patient is agreeable to SNF placement. First preference is [facility] because his wife is there for rehab. Admissions coordinator is aware and will review. No further concerns. CSW encouraged patient to contact CSW as needed. CSW will continue to follow patient for support and facilitate discharge to SNF once bed obtained and insurance authorization approved.                 Expected Discharge Plan: Skilled Nursing Facility Barriers to Discharge: SNF Pending bed offer, Insurance Authorization   Patient Goals and CMS Choice            Expected Discharge Plan and Services     Post Acute Care Choice: Skilled Nursing Facility Living arrangements for the past 2 months: Apartment                                      Prior Living Arrangements/Services Living arrangements for the past 2 months: Apartment Lives with:: Relatives Patient language and need for interpreter reviewed:: Yes Do you feel safe going back to the place where you live?: Yes      Need for Family Participation in Patient Care: Yes (Comment) Care giver support system in place?: Yes (comment)   Criminal Activity/Legal Involvement Pertinent to Current Situation/Hospitalization: No - Comment as needed  Activities of Daily Living      Permission Sought/Granted Permission sought to share information with : Facility Industrial/product designer granted to share information with : Yes, Verbal Permission Granted     Permission granted to share info w AGENCY: SNF's        Emotional Assessment Appearance:: Appears  stated age Attitude/Demeanor/Rapport: Engaged, Gracious Affect (typically observed): Accepting, Appropriate, Calm, Pleasant Orientation: : Oriented to Self, Oriented to Place, Oriented to  Time, Oriented to Situation Alcohol / Substance Use: Not Applicable Psych Involvement: No (comment)  Admission diagnosis:  Placement High Blood Sugar Patient Active Problem List   Diagnosis Date Noted   Demand ischemia    Urinary tract infection 11/08/2021   NSTEMI, Type ll (non-ST elevated myocardial infarction) (HCC) 11/07/2021   Acute on chronic heart failure with preserved ejection fraction (HFpEF) (HCC) 11/07/2021   Hematuria 11/07/2021   Acute on chronic blood loss anemia 11/07/2021   Hemiparesis affecting left side as late effect of cerebrovascular accident (CVA) (HCC) 11/07/2021   Acute respiratory failure with hypoxia (HCC) 11/07/2021   AKI (acute kidney injury) (HCC) 08/21/2021   Acute kidney injury (HCC) 08/20/2021   GERD without esophagitis 08/20/2021   Basal ganglia stroke (HCC) 08/12/2021   Type 2 diabetes mellitus with diabetic polyneuropathy, with long-term current use of insulin (HCC) 07/09/2021   Hypothyroidism 07/09/2021   Depression 10/21/2020   Non compliance w medication regimen 10/21/2020   Cocaine abuse (HCC) 09/05/2020   Adjustment disorder with emotional disturbance 09/05/2020   Essential hypertension 02/14/2011   Coronary artery disease involving native coronary artery of native heart without angina pectoris 08/13/2010   Stented coronary artery 08/13/2010   PCP:  System, Provider Not In Pharmacy:   [facility]  [address]), [place] - [address]) [place] [address] Phone: [phone] Fax: [phone]     Social Determinants of Health (SDOH) Social History: SDOH Screenings   Depression (PHQ2-9): Low Risk  (12/31/2021)  Tobacco Use: Low Risk  (07/24/2022)   SDOH Interventions:     Readmission  Risk Interventions    11/08/2021   11:09 AM  Readmission Risk Prevention Plan  Transportation Screening Complete  Medication Review (RN Care Manager) Complete  PCP or Specialist appointment within 3-5 days of discharge Complete  SW Recovery Care/Counseling Consult Complete  Palliative Care Screening Not Applicable  Skilled Nursing Facility Complete

## 2022-07-25 NOTE — ED Notes (Signed)
Brief checked. Pt dry and clean at this time. Still unable to provide urine specimen. Wants to go back to sleep.

## 2022-07-25 NOTE — ED Notes (Signed)
Patient with very large lose bowel movement in bed. Full bed linens changed, bed bath performed. Clean brief, applied. Three warm blankets given per request. Positioned for comfort. Patient states that he is feeling better and no longer feels the need to have a bowel movement. No distress noted.

## 2022-07-25 NOTE — ED Notes (Signed)
Patient incontinent of stool. Pericare performed and clean brief applied. Positioned for comfort. No distress noted.

## 2022-07-25 NOTE — [place] FL2 (Signed)
[place] MEDICAID FL2 LEVEL OF CARE FORM     IDENTIFICATION  Patient Name: Steve Alvarado Birthdate: 04/22/1955 Sex: male Admission Date (Current Location): 07/24/2022  [facility] and [place] Number:  Chiropodist and Address:  [facility], [address] [place], [place] [address]      Provider Number: [id number]  Attending Physician Name and Address:  No att. providers found  Relative Name and Phone Number:       Current Level of Care: Hospital Recommended Level of Care: Skilled Nursing Facility Prior Approval Number:    Date Approved/Denied:   PASRR Number: [id number]  Discharge Plan: SNF    Current Diagnoses: Patient Active Problem List   Diagnosis Date Noted   Demand ischemia    Urinary tract infection 11/08/2021   NSTEMI, Type ll (non-ST elevated myocardial infarction) (HCC) 11/07/2021   Acute on chronic heart failure with preserved ejection fraction (HFpEF) (HCC) 11/07/2021   Hematuria 11/07/2021   Acute on chronic blood loss anemia 11/07/2021   Hemiparesis affecting left side as late effect of cerebrovascular accident (CVA) (HCC) 11/07/2021   Acute respiratory failure with hypoxia (HCC) 11/07/2021   AKI (acute kidney injury) (HCC) 08/21/2021   Acute kidney injury (HCC) 08/20/2021   GERD without esophagitis 08/20/2021   Basal ganglia stroke (HCC) 08/12/2021   Type 2 diabetes mellitus with diabetic polyneuropathy, with long-term current use of insulin (HCC) 07/09/2021   Hypothyroidism 07/09/2021   Depression 10/21/2020   Non compliance w medication regimen 10/21/2020   Cocaine abuse (HCC) 09/05/2020   Adjustment disorder with emotional disturbance 09/05/2020   Essential hypertension 02/14/2011   Coronary artery disease involving native coronary artery of native heart without angina pectoris 08/13/2010   Stented coronary artery 08/13/2010    Orientation RESPIRATION BLADDER Height & Weight     Self, Time, Situation,  Place  Normal Continent Weight: 190 lb (86.2 kg) Height:  6\' 2"  (188 cm)  BEHAVIORAL SYMPTOMS/MOOD NEUROLOGICAL BOWEL NUTRITION STATUS   (None)  (None) Continent Diet (Carb modified)  AMBULATORY STATUS COMMUNICATION OF NEEDS Skin   Extensive Assist Verbally Normal                       Personal Care Assistance Level of Assistance  Bathing, Feeding, Dressing Bathing Assistance: Maximum assistance Feeding assistance: Limited assistance Dressing Assistance: Maximum assistance     Functional Limitations Info  Sight, Hearing, Speech Sight Info: Adequate Hearing Info: Adequate Speech Info: Adequate    SPECIAL CARE FACTORS FREQUENCY  PT (By licensed PT), OT (By licensed OT)     PT Frequency: 5 x week OT Frequency: 5 x week            Contractures Contractures Info: Not present    Additional Factors Info  Code Status, Allergies Code Status Info: Full code Allergies Info: Ace Inhibitors           Current Medications (07/25/2022):  This is the current hospital active medication list Current Facility-Administered Medications  Medication Dose Route Frequency Provider Last Rate Last Admin   albuterol (PROVENTIL) (2.5 MG/3ML) 0.083% nebulizer solution 2.5 mg  2.5 mg Nebulization Q6H PRN Otelia Sergeant, RPH       amLODipine (NORVASC) tablet 10 mg  10 mg Oral q morning Dolores Frame, Jade J, MD   10 mg at 07/25/22 1000   aspirin EC tablet 81 mg  81 mg Oral Daily Irean Hong, MD   81 mg at 07/25/22 [phone]  atorvastatin (LIPITOR) tablet 40 mg  40 mg Oral Daily Irean Hong, MD   40 mg at 07/25/22 1000   carvedilol (COREG) tablet 3.125 mg  3.125 mg Oral BID WC Irean Hong, MD   3.125 mg at 07/25/22 0749   citalopram (CELEXA) tablet 20 mg  20 mg Oral Daily Irean Hong, MD   20 mg at 07/25/22 1000   clopidogrel (PLAVIX) tablet 75 mg  75 mg Oral QHS Irean Hong, MD       DULoxetine (CYMBALTA) DR capsule 60 mg  60 mg Oral Daily Irean Hong, MD   60 mg at 07/25/22 1000   ferrous sulfate  tablet 325 mg  325 mg Oral Shirline Frees, MD   325 mg at 07/25/22 1000   furosemide (LASIX) tablet 20 mg  20 mg Oral Daily Irean Hong, MD   20 mg at 07/25/22 1000   insulin aspart (novoLOG) injection 0-5 Units  0-5 Units Subcutaneous QHS Chesley Noon, MD       insulin aspart (novoLOG) injection 0-9 Units  0-9 Units Subcutaneous TID WC Chesley Noon, MD   3 Units at 07/25/22 1142   insulin glargine-yfgn (SEMGLEE) injection 20 Units  20 Units Subcutaneous QHS Irean Hong, MD       levothyroxine (SYNTHROID) tablet 75 mcg  75 mcg Oral QHS Irean Hong, MD       lidocaine (LIDODERM) 5 % 1 patch  1 patch Transdermal Q24H Irean Hong, MD   1 patch at 07/25/22 0751   melatonin tablet 5 mg  5 mg Oral QHS Irean Hong, MD       polyethylene glycol (MIRALAX / GLYCOLAX) packet 17 g  17 g Oral Daily PRN Irean Hong, MD       pregabalin (LYRICA) capsule 50 mg  50 mg Oral TID Irean Hong, MD   50 mg at 07/25/22 1000   sacubitril-valsartan (ENTRESTO) 24-26 mg per tablet  1 tablet Oral BID Irean Hong, MD   1 tablet at 07/25/22 1000   Current Outpatient Medications  Medication Sig Dispense Refill   albuterol (VENTOLIN HFA) 108 (90 Base) MCG/ACT inhaler Inhale 2 puffs into the lungs every 6 (six) hours as needed for wheezing.     amLODipine (NORVASC) 10 MG tablet Take 1 tablet (10 mg total) by mouth every morning. 30 tablet 0   aspirin EC 81 MG tablet Take 1 tablet (81 mg total) by mouth daily. Swallow whole. 30 tablet 12   atorvastatin (LIPITOR) 40 MG tablet Take 1 tablet (40 mg total) by mouth every evening. (Patient taking differently: Take 40 mg by mouth daily.) 30 tablet 0   carvedilol (COREG) 3.125 MG tablet Take 1 tablet (3.125 mg total) by mouth 2 (two) times daily with a meal. (Patient taking differently: Take 3.125 mg by mouth in the morning and at bedtime.) 60 tablet 0   citalopram (CELEXA) 20 MG tablet Take 1 tablet (20 mg total) by mouth daily. 30 tablet 0   clopidogrel (PLAVIX) 75 MG  tablet Take 1 tablet (75 mg total) by mouth daily. (Patient taking differently: Take 75 mg by mouth at bedtime.) 17 tablet 0   DULoxetine (CYMBALTA) 30 MG capsule Take 60 mg by mouth daily.     ferrous sulfate 325 (65 FE) MG EC tablet Take 1 tablet (325 mg total) by mouth every other day. 60 tablet 3   furosemide (LASIX) 20 MG tablet Take 1  tablet (20 mg total) by mouth daily. 30 tablet 11   insulin lispro (HUMALOG) 100 UNIT/ML injection Inject 0.03 mLs (3 Units total) into the skin 3 (three) times daily with meals. 10 mL 11   LANTUS SOLOSTAR 100 UNIT/ML Solostar Pen Inject 20 Units into the skin at bedtime.     levothyroxine (SYNTHROID) 75 MCG tablet Take 75 mcg by mouth at bedtime. 2200     lidocaine 4 % Place 1 patch onto the skin daily. Apply one patch topically to left abdomen every morning - leave on for 12 hours. 12 hours on and 12 hours off.     melatonin 5 MG TABS Take 5 mg by mouth at bedtime.     polyethylene glycol (MIRALAX / GLYCOLAX) 17 g packet Take 17 g by mouth daily as needed for mild constipation. 14 each 0   pregabalin (LYRICA) 50 MG capsule Take 50 mg by mouth 3 (three) times daily.     sacubitril-valsartan (ENTRESTO) 24-26 MG Take 1 tablet by mouth 2 (two) times daily. 60 tablet 3   ASPERCREME LIDOCAINE 4 % Place 1 patch onto the skin See admin instructions. Apply 1 patch topically daily in the morning (Patient not taking: Reported on 07/25/2022)       Discharge Medications: Please see discharge summary for a list of discharge medications.  Relevant Imaging Results:  Relevant Lab Results:   Additional Information SS#: [id number]  Margarito Liner, LCSW

## 2022-07-25 NOTE — Evaluation (Signed)
Physical Therapy Evaluation Patient Details Name: Steve Alvarado MRN: [id number] DOB: 12-21-1955 Today's Date: 07/25/2022  History of Present Illness  Pt is a 67 y/o M admitted on 07/24/22 after presenting with c/o hyperglycemia & requesting nursing home placement 2/2 feeling like he can no longer care for himself. PMH: CVA with L sided paraplegia, CKD, HTN, cocaine use, HLD, CHF, DM, depression  Clinical Impression  Pt seen for PT evaluation with pt agreeable. Pt reports prior to admission he was living with his cousin in a 1 level apartment with level entry. Pt reports he requires 1 assist for stand pivot transfers bed<>chair & used a brief for toileting. Pt notes he feels like his cousin wants to assist him any longer & pt cannot care for himself. On this date, pt noted to be soiled with urine so PT assisted pt with doffing soiled brief & mesh underwear. Pt requires mod assist for bed mobility & max assist for stand pivot bed>recliner with cuing for hand placement & assistance for pivoting. Pt does require cuing for technique & sequencing to increase ease of all mobility tasks. Pt is unsafe to d/c home without significant assistance, which he believes he no longer has. Recommend ongoing PT treatment to address balance & strengthening to increase independence with bed mobility & transfers.   Recommendations for follow up therapy are one component of a multi-disciplinary discharge planning process, led by the attending physician.  Recommendations may be updated based on patient status, additional functional criteria and insurance authorization.  Follow Up Recommendations Can patient physically be transported by private vehicle: No     Assistance Recommended at Discharge Frequent or constant Supervision/Assistance  Patient can return home with the following  A lot of help with walking and/or transfers;A lot of help with bathing/dressing/bathroom;Assist for transportation;Assistance with  cooking/housework;Help with stairs or ramp for entrance    Equipment Recommendations None recommended by PT (TBD in next venue)  Recommendations for Other Services       Functional Status Assessment Patient has had a recent decline in their functional status and demonstrates the ability to make significant improvements in function in a reasonable and predictable amount of time.     Precautions / Restrictions Precautions Precautions: Fall Precaution Comments: chronic L hemi Restrictions Weight Bearing Restrictions: No      Mobility  Bed Mobility Overal bed mobility: Needs Assistance Bed Mobility: Supine to Sit     Supine to sit: Mod assist, HOB elevated     General bed mobility comments: Pt requires assistance to move LE towards EOB & to upright trunk, cuing to push with RUE to upright trunk to sitting.    Transfers Overall transfer level: Needs assistance Equipment used: None Transfers: Bed to chair/wheelchair/BSC   Stand pivot transfers: Max assist         General transfer comment: Pt transfers bed>recliner on R with cuing for hand placement & assistance with pivoting.    Ambulation/Gait                  Stairs            Wheelchair Mobility    Modified Rankin (Stroke Patients Only)       Balance Overall balance assessment: Needs assistance Sitting-balance support: Feet supported Sitting balance-Leahy Scale: Fair  Pertinent Vitals/Pain Pain Assessment Pain Assessment: Faces Faces Pain Scale: Hurts even more Pain Location: generalized discomfort "all over" & his back Pain Descriptors / Indicators: Discomfort Pain Intervention(s): Monitored during session, Repositioned, Limited activity within patient's tolerance    Home Living Family/patient expects to be discharged to:: Skilled nursing facility                   Additional Comments: Pt was living in private residence with  his cousin prior to admission. Apartment on 1st floor with level entry.    Prior Function Prior Level of Function : Needs assist             Mobility Comments: Pt reports he requires assistance for stand/squat pivot transfers bed<>chair. ADLs Comments: Uses brief for toileting.     Hand Dominance   Dominant Hand: Right    Extremity/Trunk Assessment   Upper Extremity Assessment Upper Extremity Assessment: LUE deficits/detail LUE Deficits / Details: chronic L hemi    Lower Extremity Assessment Lower Extremity Assessment: LLE deficits/detail LLE Deficits / Details: chronic L hemi       Communication   Communication: No difficulties  Cognition Arousal/Alertness: Awake/alert Behavior During Therapy: WFL for tasks assessed/performed Overall Cognitive Status: Within Functional Limits for tasks assessed                                 General Comments: Pt requires encouragement/education to attempt tasks vs relying on PT to move him.        General Comments General comments (skin integrity, edema, etc.): BP in RUE in bed 148/76 mmHg MAP 95    Exercises     Assessment/Plan    PT Assessment Patient needs continued PT services  PT Problem List Decreased strength;Decreased range of motion;Decreased activity tolerance;Decreased balance;Decreased mobility;Decreased safety awareness;Decreased knowledge of use of DME       PT Treatment Interventions Therapeutic exercise;DME instruction;Gait training;Balance training;Stair training;Functional mobility training;Therapeutic activities;Patient/family education;Neuromuscular re-education;Wheelchair mobility training    PT Goals (Current goals can be found in the Care Plan section)  Acute Rehab PT Goals Patient Stated Goal: placement PT Goal Formulation: With patient Time For Goal Achievement: 08/08/22 Potential to Achieve Goals: Good Additional Goals Additional Goal #1: Pt will propel w/c x 150 ft with mod I to  increase independence with mobility.    Frequency Min 2X/week     Co-evaluation               AM-PAC PT "6 Clicks" Mobility  Outcome Measure Help needed turning from your back to your side while in a flat bed without using bedrails?: A Lot Help needed moving from lying on your back to sitting on the side of a flat bed without using bedrails?: A Lot Help needed moving to and from a bed to a chair (including a wheelchair)?: A Lot Help needed standing up from a chair using your arms (e.g., wheelchair or bedside chair)?: A Lot Help needed to walk in hospital room?: Total Help needed climbing 3-5 steps with a railing? : Total 6 Click Score: 10    End of Session   Activity Tolerance: Patient tolerated treatment well Patient left: in chair (sitting in hallway near nursing desk) Nurse Communication:  (pt was soiled) PT Visit Diagnosis: Muscle weakness (generalized) (M62.81);Other abnormalities of gait and mobility (R26.89)    Time: 1610-9604 PT Time Calculation (min) (ACUTE ONLY): 20 min   Charges:   PT  Evaluation $PT Eval Low Complexity: 1 Low          Aleda Grana, PT, DPT 07/25/22, 9:24 AM   Sandi Mariscal 07/25/2022, 9:19 AM

## 2022-07-25 NOTE — ED Notes (Signed)
Patient with massive loose bowel movement. Pericare performed and dry brief applied. Positioned for comfort.

## 2022-07-25 NOTE — ED Notes (Signed)
This RN spoke to pts niece, update given.

## 2022-07-25 NOTE — ED Notes (Signed)
Pt placed in clean gown, linens replaced, diaper changed, perineal care preformed, new blankets given, pt repositioned.

## 2022-07-25 NOTE — ED Notes (Signed)
Unable to provide urine sample at this time

## 2022-07-25 NOTE — Evaluation (Signed)
Occupational Therapy Evaluation Patient Details Name: Steve Alvarado MRN: [id number] DOB: September 09, 1955 Today's Date: 07/25/2022   History of Present Illness Pt is a 67 y/o M admitted on 07/24/22 after presenting with c/o hyperglycemia & requesting nursing home placement 2/2 feeling like he can no longer care for himself. PMH: CVA with L sided paraplegia, CKD, HTN, cocaine use, HLD, CHF, DM, depression   Clinical Impression   Patient presenting with decreased Ind in self care,balance, functional mobility/transfers, endurance, and safety awareness. Patient reports living in an apartment with family member. He does not ambulate at baseline. He sleeps in hospital bed and endorses cousin assists him with all self care needs. He uses brief for toileting. L UE and LE chronic deficits from prior CVA.  Pt currently functioning at max A for self care and functional transfer. He believes cousin can no longer assist him at home. Patient will benefit from acute OT to increase overall independence in the areas of ADLs, functional mobility, and safety awareness in order to safely discharge.     Recommendations for follow up therapy are one component of a multi-disciplinary discharge planning process, led by the attending physician.  Recommendations may be updated based on patient status, additional functional criteria and insurance authorization.   Assistance Recommended at Discharge Intermittent Supervision/Assistance  Patient can return home with the following A lot of help with walking and/or transfers;A lot of help with bathing/dressing/bathroom;Assistance with cooking/housework;Assist for transportation;Help with stairs or ramp for entrance    Functional Status Assessment  Patient has had a recent decline in their functional status and/or demonstrates limited ability to make significant improvements in function in a reasonable and predictable amount of time  Equipment Recommendations  Other (comment) (defer  to next venue of care)       Precautions / Restrictions Precautions Precautions: Fall Precaution Comments: chronic L hemi Restrictions Weight Bearing Restrictions: No      Mobility Bed Mobility Overal bed mobility: Needs Assistance Bed Mobility: Rolling, Supine to Sit Rolling: Mod assist   Supine to sit: Mod assist, HOB elevated          Transfers Overall transfer level: Needs assistance Equipment used: None Transfers: Sit to/from Stand Sit to Stand: Max assist                  Balance Overall balance assessment: Needs assistance Sitting-balance support: Feet supported Sitting balance-Leahy Scale: Fair                                     ADL either performed or assessed with clinical judgement   ADL Overall ADL's : Needs assistance/impaired Eating/Feeding: Set up Eating/Feeding Details (indicate cue type and reason): assistance to open containers but can feed self with R UE Grooming: Set up                   Toilet Transfer: Maximal assistance Toilet Transfer Details (indicate cue type and reason): simulated                 Vision Patient Visual Report: No change from baseline              Pertinent Vitals/Pain Pain Assessment Pain Assessment: Faces Faces Pain Scale: Hurts little more Pain Location: generalized discomfort "all over" & his back Pain Descriptors / Indicators: Discomfort, Guarding Pain Intervention(s): Monitored during session, Repositioned     Hand Dominance Right  Extremity/Trunk Assessment Upper Extremity Assessment Upper Extremity Assessment: LUE deficits/detail LUE Deficits / Details: chronic L hemi - increased tone . Not functional   Lower Extremity Assessment Lower Extremity Assessment: LLE deficits/detail LLE Deficits / Details: chronic L hemi       Communication Communication Communication: No difficulties   Cognition Arousal/Alertness: Awake/alert Behavior During Therapy: WFL for  tasks assessed/performed Overall Cognitive Status: Within Functional Limits for tasks assessed                                       General Comments  BP in RUE in bed 148/76 mmHg MAP 95            Home Living Family/patient expects to be discharged to:: Skilled nursing facility                                 Additional Comments: Pt was living in private residence with his cousin prior to admission. Apartment on 1st floor with level entry.      Prior Functioning/Environment Prior Level of Function : Needs assist             Mobility Comments: Pt reports he requires assistance for stand/squat pivot transfers bed<>chair. ADLs Comments: Pt reports having hospital bed and hoyer lift at home. He does not use lift. Pt reports using brief for toileting and cousin assisting with all self care and IADLs.        OT Problem List: Decreased strength;Decreased activity tolerance;Decreased safety awareness;Impaired balance (sitting and/or standing);Decreased knowledge of use of DME or AE;Impaired UE functional use;Pain      OT Treatment/Interventions: Self-care/ADL training;Therapeutic exercise;Therapeutic activities;Energy conservation;DME and/or AE instruction;Patient/family education;Manual therapy;Balance training    OT Goals(Current goals can be found in the care plan section) Acute Rehab OT Goals Patient Stated Goal: to go to rehab OT Goal Formulation: With patient Time For Goal Achievement: 08/08/22 Potential to Achieve Goals: Fair ADL Goals Pt Will Perform Grooming: with set-up;sitting Pt Will Perform Lower Body Dressing: with mod assist;sit to/from stand Pt Will Transfer to Toilet: with mod assist;stand pivot transfer;bedside commode Pt Will Perform Toileting - Clothing Manipulation and hygiene: with mod assist;sit to/from stand  OT Frequency: Min 1X/week       AM-PAC OT "6 Clicks" Daily Activity     Outcome Measure Help from another  person eating meals?: A Little Help from another person taking care of personal grooming?: A Little Help from another person toileting, which includes using toliet, bedpan, or urinal?: Total Help from another person bathing (including washing, rinsing, drying)?: A Lot Help from another person to put on and taking off regular upper body clothing?: A Lot Help from another person to put on and taking off regular lower body clothing?: Total 6 Click Score: 12   End of Session Nurse Communication: Mobility status  Activity Tolerance: Patient limited by fatigue Patient left: in bed;with call bell/phone within reach;with bed alarm set  OT Visit Diagnosis: Unsteadiness on feet (R26.81);Repeated falls (R29.6);Muscle weakness (generalized) (M62.81)                Time: 1610-9604 OT Time Calculation (min): 11 min Charges:  OT General Charges $OT Visit: 1 Visit OT Evaluation $OT Eval Moderate Complexity: 1 [address], [place], OTR/L , CBIS ascom [phone]  07/25/22, 12:58 PM

## 2022-07-25 NOTE — ED Notes (Signed)
Pt placed in clean gown, linens replaced, diaper changed, perineal care preformed, new blankets given, pt repositioned.  

## 2022-07-25 NOTE — ED Notes (Signed)
Patient to CT at this time

## 2022-07-25 NOTE — Inpatient Diabetes Management (Signed)
Inpatient Diabetes Program Recommendations  AACE/ADA: New Consensus Statement on Inpatient Glycemic Control  Target Ranges:  Prepandial:   less than 140 mg/dL      Peak postprandial:   less than 180 mg/dL (1-2 hours)      Critically ill patients:  140 - 180 mg/dL    Latest Reference Range & Units 07/24/22 19:47 07/25/22 00:43 07/25/22 06:12 07/25/22 07:25  Glucose-Capillary 70 - 99 mg/dL 161 (H) 096 (H) 045 (H) 220 (H)   Review of Glycemic Control  Diabetes history: DM2 Outpatient Diabetes medications: Lantus 20 units QHS, Humalog 3 units TID with meals Current orders for Inpatient glycemic control: Semglee 20 units QHS, Novolog 3 units TID with meals  Inpatient Diabetes Program Recommendations:    Insulin: Please consider ordering CBGs AC&HS and Novolog 0-9 units TID with meals and Novolog 0-5 units QHS.  Thanks, Orlando Penner, RN, MSN, CDCES Diabetes Coordinator Inpatient Diabetes Program [phone] (Team Pager from 8am to 5pm)

## 2022-07-26 DIAGNOSIS — K2100 Gastro-esophageal reflux disease with esophagitis, without bleeding: Secondary | ICD-10-CM | POA: Insufficient documentation

## 2022-07-26 DIAGNOSIS — N179 Acute kidney failure, unspecified: Secondary | ICD-10-CM | POA: Diagnosis not present

## 2022-07-26 DIAGNOSIS — E1165 Type 2 diabetes mellitus with hyperglycemia: Secondary | ICD-10-CM | POA: Diagnosis not present

## 2022-07-26 DIAGNOSIS — I5032 Chronic diastolic (congestive) heart failure: Secondary | ICD-10-CM | POA: Insufficient documentation

## 2022-07-26 DIAGNOSIS — Z794 Long term (current) use of insulin: Secondary | ICD-10-CM

## 2022-07-26 DIAGNOSIS — N390 Urinary tract infection, site not specified: Secondary | ICD-10-CM | POA: Diagnosis not present

## 2022-07-26 DIAGNOSIS — B9620 Unspecified Escherichia coli [E. coli] as the cause of diseases classified elsewhere: Secondary | ICD-10-CM

## 2022-07-26 DIAGNOSIS — R197 Diarrhea, unspecified: Secondary | ICD-10-CM | POA: Diagnosis not present

## 2022-07-26 DIAGNOSIS — I2510 Atherosclerotic heart disease of native coronary artery without angina pectoris: Secondary | ICD-10-CM | POA: Insufficient documentation

## 2022-07-26 LAB — BASIC METABOLIC PANEL
Anion gap: 6 (ref 5–15)
BUN: 21 mg/dL (ref 8–23)
CO2: 24 mmol/L (ref 22–32)
Calcium: 8.3 mg/dL — ABNORMAL LOW (ref 8.9–10.3)
Chloride: 106 mmol/L (ref 98–111)
Creatinine, Ser: 1.01 mg/dL (ref 0.61–1.24)
GFR, Estimated: >60 mL/min (ref >60)
Glucose, Bld: 200 mg/dL — ABNORMAL HIGH (ref 70–99)
Potassium: 3.7 mmol/L (ref 3.5–5.1)
Sodium: 136 mmol/L (ref 135–145)

## 2022-07-26 LAB — URINALYSIS, ROUTINE W REFLEX MICROSCOPIC
Bilirubin Urine: NEGATIVE
Glucose, UA: >=500 mg/dL — AB
Ketones, ur: NEGATIVE mg/dL
Nitrite: NEGATIVE
Protein, ur: 100 mg/dL — AB
Specific Gravity, Urine: 1.021 (ref 1.005–1.030)
Squamous Epithelial / HPF: NONE SEEN /HPF (ref 0–5)
WBC, UA: >50 WBC/hpf (ref 0–5)
pH: 5 (ref 5.0–8.0)

## 2022-07-26 LAB — CBC
HCT: 29.2 % — ABNORMAL LOW (ref 39.0–52.0)
Hemoglobin: 9.5 g/dL — ABNORMAL LOW (ref 13.0–17.0)
MCH: 28.1 pg (ref 26.0–34.0)
MCHC: 32.5 g/dL (ref 30.0–36.0)
MCV: 86.4 fL (ref 80.0–100.0)
Platelets: 273 10*3/uL (ref 150–400)
RBC: 3.38 MIL/uL — ABNORMAL LOW (ref 4.22–5.81)
RDW: 12.6 % (ref 11.5–15.5)
WBC: 8.2 10*3/uL (ref 4.0–10.5)
nRBC: 0 % (ref 0.0–0.2)

## 2022-07-26 LAB — C DIFFICILE QUICK SCREEN W PCR REFLEX
C Diff antigen: NEGATIVE
C Diff interpretation: NOT DETECTED
C Diff toxin: NEGATIVE

## 2022-07-26 LAB — CBG MONITORING, ED
Glucose-Capillary: 172 mg/dL — ABNORMAL HIGH (ref 70–99)
Glucose-Capillary: 257 mg/dL — ABNORMAL HIGH (ref 70–99)
Glucose-Capillary: 256 mg/dL — ABNORMAL HIGH (ref 70–99)

## 2022-07-26 LAB — HEMOGLOBIN A1C
Hgb A1c MFr Bld: 11.1 % — ABNORMAL HIGH (ref 4.8–5.6)
Mean Plasma Glucose: 271.87 mg/dL

## 2022-07-26 LAB — GLUCOSE, CAPILLARY: Glucose-Capillary: 232 mg/dL — ABNORMAL HIGH (ref 70–99)

## 2022-07-26 MED ORDER — INSULIN ASPART 100 UNIT/ML IJ SOLN
3.0000 [IU] | Freq: Three times a day (TID) | INTRAMUSCULAR | Status: DC
  Administered 2022-07-26 – 2022-07-27 (×2): 3 [IU] via SUBCUTANEOUS
  Filled 2022-07-26 (×2): qty 1

## 2022-07-26 MED ORDER — MAGNESIUM HYDROXIDE 400 MG/5ML PO SUSP: 30.0000 mL | Freq: Every day | ORAL | Status: DC | PRN

## 2022-07-26 MED ORDER — ONDANSETRON HCL 4 MG/2ML IJ SOLN: 4.0000 mg | Freq: Four times a day (QID) | INTRAMUSCULAR | Status: DC | PRN

## 2022-07-26 MED ORDER — TRAZODONE HCL 50 MG PO TABS: 25.0000 mg | ORAL_TABLET | Freq: Every evening | ORAL | Status: DC | PRN

## 2022-07-26 MED ORDER — INSULIN ASPART 100 UNIT/ML IJ SOLN
0.0000 [IU] | Freq: Every day | INTRAMUSCULAR | Status: DC
  Administered 2022-07-26: 2 [IU] via SUBCUTANEOUS
  Filled 2022-07-26: qty 1

## 2022-07-26 MED ORDER — SODIUM CHLORIDE 0.9 % IV SOLN: INTRAVENOUS | Status: DC

## 2022-07-26 MED ORDER — ACETAMINOPHEN 650 MG RE SUPP: 650.0000 mg | Freq: Four times a day (QID) | RECTAL | Status: DC | PRN

## 2022-07-26 MED ORDER — SODIUM CHLORIDE 0.9 % IV SOLN
1.0000 g | Freq: Once | INTRAVENOUS | Status: AC
  Administered 2022-07-26: 1 g via INTRAVENOUS
  Filled 2022-07-26: qty 10

## 2022-07-26 MED ORDER — ACETAMINOPHEN 325 MG PO TABS: 650.0000 mg | ORAL_TABLET | Freq: Four times a day (QID) | ORAL | Status: DC | PRN

## 2022-07-26 MED ORDER — ONDANSETRON HCL 4 MG PO TABS: 4.0000 mg | ORAL_TABLET | Freq: Four times a day (QID) | ORAL | Status: DC | PRN

## 2022-07-26 MED ORDER — SODIUM CHLORIDE 0.9 % IV SOLN
1.0000 g | INTRAVENOUS | Status: DC
  Administered 2022-07-27 – 2022-07-28 (×2): 1 g via INTRAVENOUS
  Filled 2022-07-26 (×2): qty 1

## 2022-07-26 MED ORDER — INSULIN ASPART 100 UNIT/ML IJ SOLN
0.0000 [IU] | Freq: Three times a day (TID) | INTRAMUSCULAR | Status: DC
  Administered 2022-07-26: 8 [IU] via SUBCUTANEOUS
  Administered 2022-07-26: 3 [IU] via SUBCUTANEOUS
  Administered 2022-07-26: 8 [IU] via SUBCUTANEOUS
  Administered 2022-07-27: 3 [IU] via SUBCUTANEOUS
  Administered 2022-07-27: 2 [IU] via SUBCUTANEOUS
  Administered 2022-07-27: 3 [IU] via SUBCUTANEOUS
  Administered 2022-07-28: 5 [IU] via SUBCUTANEOUS
  Administered 2022-07-28: 2 [IU] via SUBCUTANEOUS
  Filled 2022-07-26 (×8): qty 1

## 2022-07-26 MED ORDER — SODIUM CHLORIDE 0.9 % IV SOLN: 1.0000 g | INTRAVENOUS | Status: DC

## 2022-07-26 MED ORDER — ENOXAPARIN SODIUM 40 MG/0.4ML IJ SOSY
40.0000 mg | PREFILLED_SYRINGE | INTRAMUSCULAR | Status: DC
  Administered 2022-07-26 – 2022-07-28 (×3): 40 mg via SUBCUTANEOUS
  Filled 2022-07-26 (×3): qty 0.4

## 2022-07-26 NOTE — Plan of Care (Signed)
Patient was seen and examined at bedside in the ED.  Patient was admitted with complaining of nausea vomiting and diarrhea.  Currently patient denies any nausea, no vomiting and did not have any diarrhea in the morning.  Patient was resting comfortably, denied any active issues.  We will continue current treatment and follow along. TOC following, possible SNF placement, awaiting for insurance authorization.

## 2022-07-26 NOTE — Progress Notes (Signed)
Occupational Therapy Treatment Patient Details Name: SEBRON KASHAT MRN: [id number] DOB: 09-10-1955 Today's Date: 07/26/2022   History of present illness Pt is a 67 y/o M admitted on 07/24/22 after presenting with c/o hyperglycemia & requesting nursing home placement 2/2 feeling like he can no longer care for himself. PMH: CVA with L sided paraplegia, CKD, HTN, cocaine use, HLD, CHF, DM, depression   OT comments  Patient received supine in bed and agreeable to OT. Pt required Mod A to come to EOB this date. Once sitting EOB, pt engaged in UB dressing and grooming tasks. Pt attempting to complete lateral scooting along EOB before returning to supine, ultimately Max A required 2/2 chronic L side deficits. Pt left as received with all needs in reach. Pt is making progress toward goal completion. D/C recommendation remains appropriate. OT will continue to follow acutely.    Recommendations for follow up therapy are one component of a multi-disciplinary discharge planning process, led by the attending physician.  Recommendations may be updated based on patient status, additional functional criteria and insurance authorization.    Assistance Recommended at Discharge Intermittent Supervision/Assistance  Patient can return home with the following  A lot of help with walking and/or transfers;A lot of help with bathing/dressing/bathroom;Assistance with cooking/housework;Assist for transportation;Help with stairs or ramp for entrance   Equipment Recommendations  Other (comment) (defer to next venue of care)    Recommendations for Other Services      Precautions / Restrictions Precautions Precautions: Fall Precaution Comments: chronic L hemi Restrictions Weight Bearing Restrictions: No       Mobility Bed Mobility Overal bed mobility: Needs Assistance Bed Mobility: Supine to Sit, Sit to Supine     Supine to sit: Mod assist, HOB elevated Sit to supine: Mod assist   General bed mobility  comments: assist for LUE/LE    Transfers Overall transfer level: Needs assistance Equipment used: None Transfers: Bed to chair/wheelchair/BSC            Lateral/Scoot Transfers: Max assist General transfer comment: Pt attempting to complete lateral scooting along EOB before returning to supine, ultimately Max A required 2/2 chronic L side deficits.     Balance Overall balance assessment: Needs assistance Sitting-balance support: Feet supported Sitting balance-Leahy Scale: Fair Sitting balance - Comments: use of RUE for support, CGA for safety during dynamic ADLs         ADL either performed or assessed with clinical judgement   ADL Overall ADL's : Needs assistance/impaired Eating/Feeding: Set up;Bed level Eating/Feeding Details (indicate cue type and reason): assistance required to open small packets/containers Grooming: Sitting;Set up;Supervision/safety Grooming Details (indicate cue type and reason): pt able to wash face with R hand         Upper Body Dressing : Maximal assistance;Sitting Upper Body Dressing Details (indicate cue type and reason): to don/doff gown      Extremity/Trunk Assessment Upper Extremity Assessment Upper Extremity Assessment: LUE deficits/detail LUE Deficits / Details: chronic L hemi - increased tone . Not functional   Lower Extremity Assessment Lower Extremity Assessment: LLE deficits/detail LLE Deficits / Details: chronic L hemi        Vision Patient Visual Report: No change from baseline     Perception     Praxis      Cognition Arousal/Alertness: Awake/alert Behavior During Therapy: WFL for tasks assessed/performed Overall Cognitive Status: Within Functional Limits for tasks assessed          Exercises      Shoulder Instructions  General Comments      Pertinent Vitals/ Pain       Pain Assessment Pain Assessment: Faces Faces Pain Scale: Hurts little more Pain Location: generalized discomfort "all over" &  his back Pain Descriptors / Indicators: Discomfort, Guarding Pain Intervention(s): Monitored during session, Repositioned  Home Living            Prior Functioning/Environment              Frequency  Min 1X/week        Progress Toward Goals  OT Goals(current goals can now be found in the care plan section)  Progress towards OT goals: Progressing toward goals  Acute Rehab OT Goals Patient Stated Goal: to go to rehab OT Goal Formulation: With patient Time For Goal Achievement: 08/08/22 Potential to Achieve Goals: Fair  Plan Discharge plan remains appropriate;Frequency remains appropriate    Co-evaluation                 AM-PAC OT "6 Clicks" Daily Activity     Outcome Measure   Help from another person eating meals?: A Little Help from another person taking care of personal grooming?: A Little Help from another person toileting, which includes using toliet, bedpan, or urinal?: Total Help from another person bathing (including washing, rinsing, drying)?: A Lot Help from another person to put on and taking off regular upper body clothing?: A Lot Help from another person to put on and taking off regular lower body clothing?: Total 6 Click Score: 12    End of Session    OT Visit Diagnosis: Unsteadiness on feet (R26.81);Repeated falls (R29.6);Muscle weakness (generalized) (M62.81)   Activity Tolerance Patient tolerated treatment well   Patient Left in bed;with call bell/phone within reach;with bed alarm set   Nurse Communication Mobility status        Time: 1610-9604 OT Time Calculation (min): 19 min  Charges: OT General Charges $OT Visit: 1 Visit OT Treatments $Self Care/Home Management : 8-22 mins  [facility] MS, OTR/L ascom [phone]  07/26/22, 2:58 PM

## 2022-07-26 NOTE — Assessment & Plan Note (Signed)
-   We will continue aspirin and Plavix as well as beta-blocker therapy with Coreg

## 2022-07-26 NOTE — Assessment & Plan Note (Signed)
-   We will continue his antihypertensives. 

## 2022-07-26 NOTE — Assessment & Plan Note (Signed)
-   We will continue IV Rocephin and follow urine culture. 

## 2022-07-26 NOTE — Assessment & Plan Note (Addendum)
-   He will be placed on supplemental coverage with NovoLog and continue his basal coverage.

## 2022-07-26 NOTE — ED Notes (Signed)
Patient incontinent of stool. Peri-care performed. Clean brief applied and positioned for comfort. Denies needs at this time. Bed in low position with side rails up and call light in reach.

## 2022-07-26 NOTE — Assessment & Plan Note (Signed)
Continue PPI therapy. 

## 2022-07-26 NOTE — TOC Progression Note (Addendum)
Transition of Care [facility]) - Progression Note    Patient Details  Name: Steve Alvarado MRN: [id number] Date of Birth: 1955/04/10  [facility] of Care [facility]) CM/SW Contact  Margarito Liner, LCSW Phone Number: 07/26/2022, 11:54 AM  Clinical Narrative: Patient has accepted bed offer from [facility]. He is aware he needs to start working on plan for after rehab. Notified admissions assistant.  1:27 pm: Per MD, likely discharge tomorrow. SNF is aware. Started English as a second language teacher.  3:53 pm: Auth approved. Auth number hasn't generated yet. Reference # [id number]. Valid 5/7-5/9.  Expected Discharge Plan: Skilled Nursing Facility Barriers to Discharge: SNF Pending bed offer, Insurance Authorization  Expected Discharge Plan and Services     Post Acute Care Choice: Skilled Nursing Facility Living arrangements for the past 2 months: Apartment                                       Social Determinants of Health (SDOH) Interventions SDOH Screenings   Depression (PHQ2-9): Low Risk  (12/31/2021)  Tobacco Use: Low Risk  (07/24/2022)    Readmission Risk Interventions    11/08/2021   11:09 AM  Readmission Risk Prevention Plan  Transportation Screening Complete  Medication Review (RN Care Manager) Complete  PCP or Specialist appointment within 3-5 days of discharge Complete  SW Recovery Care/Counseling Consult Complete  Palliative Care Screening Not Applicable  Skilled Nursing Facility Complete

## 2022-07-26 NOTE — Assessment & Plan Note (Signed)
-   We will continue Synthroid. - We will check TSH. 

## 2022-07-26 NOTE — Assessment & Plan Note (Addendum)
-   The patient be admitted to an observation medical bed. - We will continue him on hydration with IV normal saline. - His stool C. difficile came back negative. - Antidiarrheals will be provided.

## 2022-07-26 NOTE — Plan of Care (Signed)
  Problem: Coping: Goal: Ability to adjust to condition or change in health will improve Outcome: Progressing   Problem: Skin Integrity: Goal: Risk for impaired skin integrity will decrease Outcome: Progressing   Problem: Education: Goal: Knowledge of General Education information will improve Description: Including pain rating scale, medication(s)/side effects and non-pharmacologic comfort measures Outcome: Progressing   Problem: Activity: Goal: Risk for activity intolerance will decrease Outcome: Progressing   Problem: Nutrition: Goal: Adequate nutrition will be maintained Outcome: Progressing

## 2022-07-26 NOTE — Inpatient Diabetes Management (Signed)
Inpatient Diabetes Program Recommendations  AACE/ADA: New Consensus Statement on Inpatient Glycemic Control   Target Ranges:  Prepandial:   less than 140 mg/dL      [facility]ak postprandial:   less than 180 mg/dL (1-2 hours)      Critically ill patients:  140 - 180 mg/dL    Latest Reference Range & Units 07/25/22 07:25 07/25/22 11:31 07/25/22 14:29 07/25/22 16:56 07/25/22 21:37 07/26/22 08:22  Glucose-Capillary 70 - 99 mg/dL 161 (H) 096 (H) 045 (H) 254 (H) 266 (H) 172 (H)   Review of Glycemic Control  Diabetes history: DM2 Outpatient Diabetes medications: Lantus 20 units QHS, Humalog 3 units TID with meals Current orders for Inpatient glycemic control: Semglee 20 units QHS, Novolog 0-15 units TID with meals, Novolog 0-5 units QHS  Inpatient Diabetes Program Recommendations:    Insulin: Please consider ordering Novolog 3 units TID with meals for meal coverage if patient eats at least 50% of meals.  Thanks, Orlando Penner, RN, MSN, CDCES Diabetes Coordinator Inpatient Diabetes Program [phone] (Team Pager from 8am to 5pm)

## 2022-07-26 NOTE — Assessment & Plan Note (Signed)
-   This is clearly prerenal due to volume depletion and dehydration from intractable diarrhea. - The patient will be hydrated with IV normal saline and will follow BMP. - We will avoid nephrotoxins.

## 2022-07-26 NOTE — Assessment & Plan Note (Signed)
-   Continue Entresto with renal functions improvement.

## 2022-07-26 NOTE — ED Notes (Signed)
Patient refused fecal management system at this time.

## 2022-07-26 NOTE — H&P (Signed)
[place]   PATIENT NAME: Steve Alvarado    MR#:  [id number]  DATE OF BIRTH:  [birth year]  DATE OF ADMISSION:  07/24/2022  PRIMARY CARE PHYSICIAN: System, Provider Not In   Patient is coming from: Home  REQUESTING/REFERRING PHYSICIAN: Delton Prairie, MD  CHIEF COMPLAINT:   Chief Complaint  Patient presents with  . Hyperglycemia    HISTORY OF PRESENT ILLNESS:  Steve Alvarado is a 67 y.o. African-American male with medical history significant for diastolic CHF, stage II chronic kidney disease, cocaine abuse, coronary artery disease, depression, type 2 diabetes mellitus, hypertension, hypothyroidism and dyslipidemia, who presented to the emergency room with acute onset of intractable diarrhea with associated nausea and vomiting.  He denied any abdominal pain however has been having "whole body pain".  No fever or chills.  No dysuria, oliguria or hematuria or flank pain.  No headache or dizziness or blurred vision.  He denies any chest pain or palpitations.  No cough or wheezing.  He was watched in the emergency room and was given hydration after presenting initially for hyperglycemia however has continued to have diarrhea.  ED Course: When he came to the ER BP was 151/81 with respiratory rate of 21 with otherwise normal vital signs.  Latest BP was 161/88 with otherwise normal vital signs.  Labs revealed a blood glucose of 264 and CBC with hemoglobin 11 and hematocrit 33 point and UA that was positive for UTI with more than 500 glucose. EKG as reviewed by me : EKG showed sinus rhythm with a rate of 88 with first-degree AV block and PVCs as well as PACs and T wave inversion inferiorly with poor R wave progression. Imaging: Portable chest x-ray showed no acute cardiopulmonary disease.  The patient was given hydration with 2 L bolus of IV normal saline and a gram of IV Rocephin.  He will be admitted to an observation medical bed for further evaluation and management. PAST MEDICAL  HISTORY:   Past Medical History:  Diagnosis Date  . Basal ganglia stroke (HCC)    a. 07/2021 L sided wkns/facial droop/fall-->MRI brain: nonhemorrhagic infarct of posterior R lentiform nucleus and corona radiata measure. Remote lacunar infarcts of post cerebellum bilat and L thalamus.  . CHF (congestive heart failure) (HCC)   . CKD (chronic kidney disease), stage II    a. 08/2021 AKI w/ creat up to 3.4.  . Cocaine use   . Coronary artery disease    a. 07/2010 PCI: LAD 82m, LCX 80 diff/small, OM1 107m, RCA 1m (3.0x18 Vision BMS). EF 50%; b. 07/2013 Cath: LM nl, LAD 30p, 20d, LCX 20d, LCX 100, OM1 60, RCA 30p, 40m, RPDA 90-->Med Rx; c. 10/2020 MV: EF 46% (60-65% by echo), no ischemia. 3V cor Ca2+.  . Depression   . Diabetes mellitus    Type II  . Diastolic dysfunction    a. 10/2020 Echo: EF 60-65%, no rwma, mild LVH, GrI DD, nl RV size/fxn; b. 07/2021 Echo: EF 60-65%, no rwma, GrI DD, nl RV fxn, mildly dil LA, triv MR; c. 10/2021 Echo: EF 55-60%, no rwma, GrI DD, nl RV fxn, mild-mod MR, Asc Ao 37mm.  . Hyperlipidemia   . Hypertension   . Hypothyroidism     PAST SURGICAL HISTORY:   Past Surgical History:  Procedure Laterality Date  . CARDIAC CATHETERIZATION  08/06/2010   Bare metal stent placed in RCA.  Marland Kitchen HAND SURGERY     left    SOCIAL HISTORY:  Social History   Tobacco Use  . Smoking status: Never  . Smokeless tobacco: Never  Substance Use Topics  . Alcohol use: No    FAMILY HISTORY:   Family History  Problem Relation Age of Onset  . Heart attack Mother     DRUG ALLERGIES:   Allergies  Allergen Reactions  . Ace Inhibitors Cough    Reaction not listed on MAR    REVIEW OF SYSTEMS:   ROS As per history of present illness. All pertinent systems were reviewed above. Constitutional, HEENT, cardiovascular, respiratory, GI, GU, musculoskeletal, neuro, psychiatric, endocrine, integumentary and hematologic systems were reviewed and are otherwise negative/unremarkable except  for positive findings mentioned above in the HPI.   MEDICATIONS AT HOME:   Prior to Admission medications   Medication Sig Start Date End Date Taking? Authorizing Provider  albuterol (VENTOLIN HFA) 108 (90 Base) MCG/ACT inhaler Inhale 2 puffs into the lungs every 6 (six) hours as needed for wheezing.   Yes [provider]  amLODipine (NORVASC) 10 MG tablet Take 1 tablet (10 mg total) by mouth every morning. 08/19/21  Yes de Saintclair Halsted, Cortney E, NP  aspirin EC 81 MG tablet Take 1 tablet (81 mg total) by mouth daily. Swallow whole. 08/18/21  Yes Shafer, Ludger Nutting, NP  atorvastatin (LIPITOR) 40 MG tablet Take 1 tablet (40 mg total) by mouth every evening. Patient taking differently: Take 40 mg by mouth daily. 10/23/20  Yes Delfino Lovett, MD  carvedilol (COREG) 3.125 MG tablet Take 1 tablet (3.125 mg total) by mouth 2 (two) times daily with a meal. Patient taking differently: Take 3.125 mg by mouth in the morning and at bedtime. 10/23/20  Yes Delfino Lovett, MD  citalopram (CELEXA) 20 MG tablet Take 1 tablet (20 mg total) by mouth daily. 10/23/20  Yes Delfino Lovett, MD  clopidogrel (PLAVIX) 75 MG tablet Take 1 tablet (75 mg total) by mouth daily. Patient taking differently: Take 75 mg by mouth at bedtime. 08/18/21  Yes Shafer, Ludger Nutting, NP  DULoxetine (CYMBALTA) 30 MG capsule Take 60 mg by mouth daily.   Yes [provider]  ferrous sulfate 325 (65 FE) MG EC tablet Take 1 tablet (325 mg total) by mouth every other day. 11/09/21 11/09/22 Yes Wouk, Wilfred Curtis, MD  furosemide (LASIX) 20 MG tablet Take 1 tablet (20 mg total) by mouth daily. 11/09/21 11/09/22 Yes Wouk, Wilfred Curtis, MD  insulin lispro (HUMALOG) 100 UNIT/ML injection Inject 0.03 mLs (3 Units total) into the skin 3 (three) times daily with meals. 10/23/20  Yes Delfino Lovett, MD  LANTUS SOLOSTAR 100 UNIT/ML Solostar Pen Inject 20 Units into the skin at bedtime. 09/14/21  Yes [provider]  levothyroxine (SYNTHROID) 75 MCG tablet Take 75 mcg  by mouth at bedtime. 2200 05/19/21  Yes [provider]  lidocaine 4 % Place 1 patch onto the skin daily. Apply one patch topically to left abdomen every morning - leave on for 12 hours. 12 hours on and 12 hours off.   Yes [provider]  melatonin 5 MG TABS Take 5 mg by mouth at bedtime.   Yes [provider]  polyethylene glycol (MIRALAX / GLYCOLAX) 17 g packet Take 17 g by mouth daily as needed for mild constipation. 08/22/21  Yes Dahal, Melina Schools, MD  pregabalin (LYRICA) 50 MG capsule Take 50 mg by mouth 3 (three) times daily. 01/24/22  Yes [provider]  sacubitril-valsartan (ENTRESTO) 24-26 MG Take 1 tablet by mouth 2 (two) times daily. 11/29/21  Yes Hackney, Tina A, FNP  ASPERCREME LIDOCAINE 4 % Place 1 patch onto the skin See admin instructions. Apply 1 patch topically daily in the morning Patient not taking: Reported on 07/25/2022 07/12/21   [provider]      VITAL SIGNS:  Blood pressure 128/70, pulse 64, temperature 97.9 F (36.6 C), temperature source Oral, resp. rate 20, height 6\' 2"  (1.88 m), weight 86.2 kg, SpO2 98 %.  PHYSICAL EXAMINATION:  Physical Exam  GENERAL:  67 y.o.-year-old African-American male patient lying in the bed with no acute distress.  EYES: Pupils equal, round, reactive to light and accommodation. No scleral icterus. Extraocular muscles intact.  HEENT: Head atraumatic, normocephalic. Oropharynx and nasopharynx clear.  NECK:  Supple, no jugular venous distention. No thyroid enlargement, no tenderness.  LUNGS: Normal breath sounds bilaterally, no wheezing, rales,rhonchi or crepitation. No use of accessory muscles of respiration.  CARDIOVASCULAR: Regular rate and rhythm, S1, S2 normal. No murmurs, rubs, or gallops.  ABDOMEN: Soft, nondistended, nontender. Bowel sounds present. No organomegaly or mass.  EXTREMITIES: No pedal edema, cyanosis, or clubbing.  NEUROLOGIC: Cranial nerves II through XII are intact. Muscle strength  5/5 in all extremities. Sensation intact. Gait not checked.  PSYCHIATRIC: The patient is alert and oriented x 3.  Normal affect and good eye contact. SKIN: No obvious rash, lesion, or ulcer.   LABORATORY PANEL:   CBC Recent Labs  Lab 07/26/22 0456  WBC 8.2  HGB 9.5*  HCT 29.2*  PLT 273   ------------------------------------------------------------------------------------------------------------------  Chemistries  Recent Labs  Lab 07/25/22 2101  NA 136  K 3.7  CL 101  CO2 24  GLUCOSE 264*  BUN 23  CREATININE 1.19  CALCIUM 8.8*  AST 15  ALT 9  ALKPHOS 90  BILITOT 0.5   ------------------------------------------------------------------------------------------------------------------  Cardiac Enzymes No results for input(s): "TROPONINI" in the last 168 hours. ------------------------------------------------------------------------------------------------------------------  RADIOLOGY:  CT ABDOMEN PELVIS W CONTRAST  Result Date: 07/25/2022 CLINICAL DATA:  Abdominal pain. EXAM: CT ABDOMEN AND PELVIS WITH CONTRAST TECHNIQUE: Multidetector CT imaging of the abdomen and pelvis was performed using the standard protocol following bolus administration of intravenous contrast. RADIATION DOSE REDUCTION: This exam was performed according to the departmental dose-optimization program which includes automated exposure control, adjustment of the mA and/or kV according to patient size and/or use of iterative reconstruction technique. CONTRAST:  OMNIPAQUE IOHEXOL 300 MG/ML  SOLN COMPARISON:  CT abdomen pelvis dated 04/26/2022. FINDINGS: Lower chest: The visualized lung bases are clear. There is coronary vascular calcification. No intra-abdominal free air or free fluid. Hepatobiliary: Apparent fatty liver. No biliary ductal dilatation. Probable gallbladder sludge versus noncalcified stone. Ultrasound is recommended for better evaluation. Pancreas: Unremarkable. No pancreatic ductal  dilatation or surrounding inflammatory changes. Spleen: Normal in size without focal abnormality. Adrenals/Urinary Tract: The adrenal glands are unremarkable. Small left renal upper pole cyst. There is no hydronephrosis on either side. The visualized ureters appear unremarkable. Mild thickened appearance of the bladder wall. Correlation with urinalysis recommended to exclude cystitis. Stomach/Bowel: Mild thickened appearance of the rectosigmoid which may be related to underdistention. Mild proctocolitis is not excluded. Clinical correlation is recommended. No bowel obstruction. The appendix is normal. Vascular/Lymphatic: Mild aortoiliac atherosclerotic disease. The IVC is unremarkable. No portal venous gas. There is no adenopathy. Reproductive: The prostate and seminal vesicles are grossly unremarkable. No pelvic mass. The left testicle is located in the inguinal canal. Other: None Musculoskeletal: Chronic fracture of the posterior left eleventh and twelfth ribs. No acute osseous pathology. IMPRESSION: 1.  Underdistention of the rectosigmoid versus mild proctocolitis. No bowel obstruction. Normal appendix. 2. Probable gallbladder sludge versus noncalcified stone. Ultrasound is recommended for better evaluation. 3. Mild thickened appearance of the bladder wall. Correlation with urinalysis recommended to exclude cystitis. 4.  Aortic Atherosclerosis (ICD10-I70.0). Electronically Signed   By: Elgie Collard M.D.   On: 07/25/2022 23:12   DG Chest 1 View  Result Date: 07/25/2022 CLINICAL DATA:  Weakness EXAM: CHEST  1 VIEW COMPARISON:  04/01/2022 FINDINGS: The heart size and mediastinal contours are within normal limits. Both lungs are clear. The visualized skeletal structures are unremarkable. IMPRESSION: No active disease. Electronically Signed   By: Charlett Nose M.D.   On: 07/25/2022 00:30      IMPRESSION AND PLAN:  Assessment and Plan: * Intractable diarrhea - The patient be admitted to an observation  medical bed. - We will continue him on hydration with IV normal saline. - His stool C. difficile came back negative. - Antidiarrheals will be provided.  Acute lower UTI - We will continue IV Rocephin and follow urine culture.  AKI (acute kidney injury) (HCC) - This is clearly prerenal due to volume depletion and dehydration from intractable diarrhea. - The patient will be hydrated with IV normal saline and will follow BMP. - We will avoid nephrotoxins.  Uncontrolled type 2 diabetes mellitus with hyperglycemia, with long-term current use of insulin (HCC) - He will be placed on supplemental coverage with NovoLog and continue his basal coverage.  Essential hypertension - We will continue his antihypertensives.  Hypothyroidism - We will continue Synthroid. - We will check TSH.  GERD with esophagitis - Continue PPI therapy.  Coronary artery disease - We will continue aspirin and Plavix as well as beta-blocker therapy with Coreg  Chronic diastolic CHF (congestive heart failure) (HCC) - Continue Entresto with renal functions improvement.       DVT prophylaxis: Lovenox. Advanced Care Planning:  Code Status: full code. Family Communication:  The plan of care was discussed in details with the patient (and family). I answered all questions. The patient agreed to proceed with the above mentioned plan. Further management will depend upon hospital course. Disposition Plan: Back to previous home environment Consults called: none. All the records are reviewed and case discussed with ED provider.  Status is: Observation  I certify that at the time of admission, it is my clinical judgment that the patient will require hospital care extending less than 2 midnights.                            Dispo: The patient is from: Home              Anticipated d/c is to: Home              Patient currently is not medically stable to d/c.              Difficult to place patient: No  Hannah Beat  M.D on 07/26/2022 at 5:17 AM  [facility]   From 7 PM-7 AM, contact night-coverage [email or web address]  CC: Primary care physician; System, Provider Not In

## 2022-07-26 NOTE — ED Provider Notes (Signed)
Boarding patient with significant increase and stool output.  Initial blood work with an AKI, somewhat improving with IV fluids.  CT with bladder wall thickening, possible proctocolitis.  Urine with infectious features.  This will be sent for culture.  I have added ceftriaxone considering the strong evidence of cystitis.  Awaiting stool for C. difficile screening.  We will consult medicine for admission considering this clinical change   Delton Prairie, MD 07/26/22 [phone]

## 2022-07-27 DIAGNOSIS — E785 Hyperlipidemia, unspecified: Secondary | ICD-10-CM | POA: Diagnosis present

## 2022-07-27 DIAGNOSIS — E860 Dehydration: Secondary | ICD-10-CM | POA: Diagnosis present

## 2022-07-27 DIAGNOSIS — E039 Hypothyroidism, unspecified: Secondary | ICD-10-CM | POA: Diagnosis present

## 2022-07-27 DIAGNOSIS — N179 Acute kidney failure, unspecified: Secondary | ICD-10-CM | POA: Diagnosis present

## 2022-07-27 DIAGNOSIS — Z8249 Family history of ischemic heart disease and other diseases of the circulatory system: Secondary | ICD-10-CM | POA: Diagnosis not present

## 2022-07-27 DIAGNOSIS — R112 Nausea with vomiting, unspecified: Secondary | ICD-10-CM | POA: Diagnosis present

## 2022-07-27 DIAGNOSIS — N390 Urinary tract infection, site not specified: Secondary | ICD-10-CM | POA: Diagnosis present

## 2022-07-27 DIAGNOSIS — Z79890 Hormone replacement therapy (postmenopausal): Secondary | ICD-10-CM | POA: Diagnosis not present

## 2022-07-27 DIAGNOSIS — Z794 Long term (current) use of insulin: Secondary | ICD-10-CM | POA: Diagnosis not present

## 2022-07-27 DIAGNOSIS — E872 Acidosis, unspecified: Secondary | ICD-10-CM | POA: Diagnosis present

## 2022-07-27 DIAGNOSIS — F32A Depression, unspecified: Secondary | ICD-10-CM | POA: Diagnosis present

## 2022-07-27 DIAGNOSIS — E1140 Type 2 diabetes mellitus with diabetic neuropathy, unspecified: Secondary | ICD-10-CM | POA: Diagnosis present

## 2022-07-27 DIAGNOSIS — Z91148 Patient's other noncompliance with medication regimen for other reason: Secondary | ICD-10-CM | POA: Diagnosis not present

## 2022-07-27 DIAGNOSIS — Z79899 Other long term (current) drug therapy: Secondary | ICD-10-CM | POA: Diagnosis not present

## 2022-07-27 DIAGNOSIS — I69354 Hemiplegia and hemiparesis following cerebral infarction affecting left non-dominant side: Secondary | ICD-10-CM | POA: Diagnosis not present

## 2022-07-27 DIAGNOSIS — I130 Hypertensive heart and chronic kidney disease with heart failure and stage 1 through stage 4 chronic kidney disease, or unspecified chronic kidney disease: Secondary | ICD-10-CM | POA: Diagnosis present

## 2022-07-27 DIAGNOSIS — E1165 Type 2 diabetes mellitus with hyperglycemia: Secondary | ICD-10-CM | POA: Diagnosis present

## 2022-07-27 DIAGNOSIS — B9620 Unspecified Escherichia coli [E. coli] as the cause of diseases classified elsewhere: Secondary | ICD-10-CM | POA: Diagnosis present

## 2022-07-27 DIAGNOSIS — R197 Diarrhea, unspecified: Secondary | ICD-10-CM | POA: Diagnosis not present

## 2022-07-27 DIAGNOSIS — E1122 Type 2 diabetes mellitus with diabetic chronic kidney disease: Secondary | ICD-10-CM | POA: Diagnosis present

## 2022-07-27 DIAGNOSIS — I2510 Atherosclerotic heart disease of native coronary artery without angina pectoris: Secondary | ICD-10-CM | POA: Diagnosis present

## 2022-07-27 DIAGNOSIS — G8929 Other chronic pain: Secondary | ICD-10-CM | POA: Diagnosis present

## 2022-07-27 DIAGNOSIS — I5032 Chronic diastolic (congestive) heart failure: Secondary | ICD-10-CM | POA: Diagnosis present

## 2022-07-27 DIAGNOSIS — K219 Gastro-esophageal reflux disease without esophagitis: Secondary | ICD-10-CM | POA: Diagnosis present

## 2022-07-27 DIAGNOSIS — D638 Anemia in other chronic diseases classified elsewhere: Secondary | ICD-10-CM | POA: Diagnosis present

## 2022-07-27 DIAGNOSIS — N182 Chronic kidney disease, stage 2 (mild): Secondary | ICD-10-CM | POA: Diagnosis present

## 2022-07-27 LAB — GLUCOSE, CAPILLARY
Glucose-Capillary: 171 mg/dL — ABNORMAL HIGH (ref 70–99)
Glucose-Capillary: 182 mg/dL — ABNORMAL HIGH (ref 70–99)
Glucose-Capillary: 131 mg/dL — ABNORMAL HIGH (ref 70–99)
Glucose-Capillary: 140 mg/dL — ABNORMAL HIGH (ref 70–99)

## 2022-07-27 LAB — CBC
HCT: 27.2 % — ABNORMAL LOW (ref 39.0–52.0)
Hemoglobin: 9.1 g/dL — ABNORMAL LOW (ref 13.0–17.0)
MCH: 28.9 pg (ref 26.0–34.0)
MCHC: 33.5 g/dL (ref 30.0–36.0)
MCV: 86.3 fL (ref 80.0–100.0)
Platelets: 259 10*3/uL (ref 150–400)
RBC: 3.15 MIL/uL — ABNORMAL LOW (ref 4.22–5.81)
RDW: 12.9 % (ref 11.5–15.5)
WBC: 4.8 10*3/uL (ref 4.0–10.5)
nRBC: 0 % (ref 0.0–0.2)

## 2022-07-27 LAB — BASIC METABOLIC PANEL
Anion gap: 6 (ref 5–15)
BUN: 23 mg/dL (ref 8–23)
CO2: 26 mmol/L (ref 22–32)
Calcium: 8.3 mg/dL — ABNORMAL LOW (ref 8.9–10.3)
Chloride: 106 mmol/L (ref 98–111)
Creatinine, Ser: 1.32 mg/dL — ABNORMAL HIGH (ref 0.61–1.24)
GFR, Estimated: 59 mL/min — ABNORMAL LOW (ref >60)
Glucose, Bld: 203 mg/dL — ABNORMAL HIGH (ref 70–99)
Potassium: 4 mmol/L (ref 3.5–5.1)
Sodium: 138 mmol/L (ref 135–145)

## 2022-07-27 LAB — MAGNESIUM: Magnesium: 1.5 mg/dL — ABNORMAL LOW (ref 1.7–2.4)

## 2022-07-27 LAB — URINE CULTURE: Culture: >=100000 — AB

## 2022-07-27 LAB — PHOSPHORUS: Phosphorus: 3.3 mg/dL (ref 2.5–4.6)

## 2022-07-27 MED ORDER — OXYCODONE HCL 5 MG PO TABS
5.0000 mg | ORAL_TABLET | Freq: Four times a day (QID) | ORAL | Status: DC | PRN
  Administered 2022-07-27 – 2022-07-28 (×3): 5 mg via ORAL
  Filled 2022-07-27 (×3): qty 1

## 2022-07-27 MED ORDER — INSULIN ASPART 100 UNIT/ML IJ SOLN
5.0000 [IU] | Freq: Three times a day (TID) | INTRAMUSCULAR | Status: DC
  Administered 2022-07-27 – 2022-07-28 (×4): 5 [IU] via SUBCUTANEOUS
  Filled 2022-07-27 (×4): qty 1

## 2022-07-27 MED ORDER — MAGNESIUM SULFATE 2 GM/50ML IV SOLN
2.0000 g | Freq: Once | INTRAVENOUS | Status: AC
  Administered 2022-07-27: 2 g via INTRAVENOUS
  Filled 2022-07-27: qty 50

## 2022-07-27 NOTE — Inpatient Diabetes Management (Signed)
Inpatient Diabetes Program Recommendations  AACE/ADA: New Consensus Statement on Inpatient Glycemic Control   Target Ranges:  Prepandial:   less than 140 mg/dL      [facility] postprandial:   less than 180 mg/dL (1-2 hours)      Critically ill patients:  140 - 180 mg/dL    Latest Reference Range & Units 07/26/22 08:22 07/26/22 11:58 07/26/22 16:24 07/26/22 21:24 07/27/22 08:26  Glucose-Capillary 70 - 99 mg/dL 161 (H) 096 (H) 045 (H) 232 (H) 171 (H)   Review of Glycemic Control  Diabetes history: DM2 Outpatient Diabetes medications: Lantus 20 units QHS, Humalog 3 units TID with meals Current orders for Inpatient glycemic control: Semglee 20 units QHS, Novolog 0-15 units TID with meals, Novolog 0-5 units QHS, Novolog 3 units TID with meals  Inpatient Diabetes Program Recommendations:    Insulin: Please consider increasing meal coverage to Novolog 5 units TID with meals.  Thanks, Orlando Penner, RN, MSN, CDCES Diabetes Coordinator Inpatient Diabetes Program [phone] (Team Pager from 8am to 5pm)

## 2022-07-27 NOTE — Progress Notes (Signed)
Occupational Therapy Treatment Patient Details Name: Steve Alvarado MRN: [id number] DOB: 1955-06-11 Today's Date: 07/27/2022   History of present illness Pt is a 67 y/o M admitted on 07/24/22 after presenting with c/o hyperglycemia & requesting nursing home placement 2/2 feeling like he can no longer care for himself. PMH: CVA with L sided paraplegia, CKD, HTN, cocaine use, HLD, CHF, DM, depression   OT comments  Patient received sitting up in bed and agreeable to OT. Pt deferred self-care tasks this date and requested to complete LUE exercises. OT completing gentle PROM of LUE initially then instructing pt in self ROM exercises (see details below), tolerated well. Pt left as received with all needs in reach. Pt is making progress toward goal completion. D/C recommendation remains appropriate. OT will continue to follow acutely.    Recommendations for follow up therapy are one component of a multi-disciplinary discharge planning process, led by the attending physician.  Recommendations may be updated based on patient status, additional functional criteria and insurance authorization.    Assistance Recommended at Discharge Intermittent Supervision/Assistance  Patient can return home with the following  A lot of help with walking and/or transfers;A lot of help with bathing/dressing/bathroom;Assistance with cooking/housework;Assist for transportation;Help with stairs or ramp for entrance   Equipment Recommendations  Other (comment) (defer to next venue of care)    Recommendations for Other Services      Precautions / Restrictions Precautions Precautions: Fall Precaution Comments: chronic L hemi Restrictions Weight Bearing Restrictions: No       Mobility Bed Mobility Overal bed mobility: Needs Assistance    Transfers         Balance           ADL either performed or assessed with clinical judgement   ADL Overall ADL's : Needs assistance/impaired         General ADL  Comments: Pt deferred grooming and toileting tasks this date, requesting to complete LUE exercises.    Extremity/Trunk Assessment Upper Extremity Assessment Upper Extremity Assessment: LUE deficits/detail LUE Deficits / Details: chronic L hemi - increased tone . Not functional   Lower Extremity Assessment Lower Extremity Assessment: LLE deficits/detail LLE Deficits / Details: chronic L hemi        Vision Patient Visual Report: No change from baseline     Perception     Praxis      Cognition Arousal/Alertness: Awake/alert Behavior During Therapy: WFL for tasks assessed/performed Overall Cognitive Status: Within Functional Limits for tasks assessed         Exercises Other Exercises Other Exercises: Pt requesting to complete LUE exercises and have OT "stretch out" arm. Pt endorsed CVA was ~1 year ago. OT provided education re: possible chronic LUE deficits, presence of tone making ROM difficult, and protecting LUE when completing exercises (gentle ROM, pain free range). OT completing gentle PROM of LUE initially then instructing pt in self ROM exercises to complete outside of therapy including digit composite flexion/extension, wrist flexion/extension, elbow flexion/extension, and shoulder flexion to 90 deg.    Shoulder Instructions       General Comments      Pertinent Vitals/ Pain       Pain Assessment Pain Assessment: Faces Faces Pain Scale: Hurts a little bit Pain Location: hands and low back Pain Descriptors / Indicators: Sore, Aching, Burning Pain Intervention(s): Limited activity within patient's tolerance, Monitored during session, Repositioned  Home Living       Prior Functioning/Environment  Frequency  Min 1X/week        Progress Toward Goals  OT Goals(current goals can now be found in the care plan section)  Progress towards OT goals: Progressing toward goals  Acute Rehab OT Goals Patient Stated Goal: to go to rehab OT Goal  Formulation: With patient Time For Goal Achievement: 08/08/22 Potential to Achieve Goals: Fair  Plan Discharge plan remains appropriate;Frequency remains appropriate    Co-evaluation                 AM-PAC OT "6 Clicks" Daily Activity     Outcome Measure   Help from another person eating meals?: A Little Help from another person taking care of personal grooming?: A Little Help from another person toileting, which includes using toliet, bedpan, or urinal?: A Lot Help from another person bathing (including washing, rinsing, drying)?: A Lot Help from another person to put on and taking off regular upper body clothing?: A Lot Help from another person to put on and taking off regular lower body clothing?: A Lot 6 Click Score: 14    End of Session    OT Visit Diagnosis: Unsteadiness on feet (R26.81);Repeated falls (R29.6);Muscle weakness (generalized) (M62.81)   Activity Tolerance Patient tolerated treatment well   Patient Left in bed;with call bell/phone within reach;with bed alarm set   Nurse Communication Mobility status        Time: 1610-9604 OT Time Calculation (min): 13 min  Charges: OT General Charges $OT Visit: 1 Visit OT Treatments $Therapeutic Exercise: 8-22 mins  [facility] MS, OTR/L ascom [phone]  07/27/22, 5:28 PM

## 2022-07-27 NOTE — Progress Notes (Signed)
Physical Therapy Treatment Patient Details Name: Steve Alvarado MRN: [id number] DOB: 08-10-1955 Today's Date: 07/27/2022   History of Present Illness Pt is a 67 y/o M admitted on 07/24/22 after presenting with c/o hyperglycemia & requesting nursing home placement 2/2 feeling like he can no longer care for himself. PMH: CVA with L sided paraplegia, CKD, HTN, cocaine use, HLD, CHF, DM, depression    PT Comments    Patient alert agreeable to PT, reported 6/10 low back pain and hand pain, RN notified. Pt's main complaint is LUE weakness, OT informed of pt goals. Supine to sit with modA, able to sit with fair balance. Ultimately maxA to squat pivot to recliner in room, pt able to move RUE to reach for recliner arm but challenged with LLE movement. The patient would benefit from further skilled PT intervention to continue to progress towards goals.      Recommendations for follow up therapy are one component of a multi-disciplinary discharge planning process, led by the attending physician.  Recommendations may be updated based on patient status, additional functional criteria and insurance authorization.  Follow Up Recommendations  Can patient physically be transported by private vehicle: No    Assistance Recommended at Discharge Frequent or constant Supervision/Assistance  Patient can return home with the following A lot of help with walking and/or transfers;A lot of help with bathing/dressing/bathroom;Assist for transportation;Assistance with cooking/housework;Help with stairs or ramp for entrance   Equipment Recommendations  None recommended by PT    Recommendations for Other Services       Precautions / Restrictions Precautions Precautions: Fall Precaution Comments: chronic L hemi Restrictions Weight Bearing Restrictions: No     Mobility  Bed Mobility Overal bed mobility: Needs Assistance Bed Mobility: Supine to Sit     Supine to sit: Mod assist     General bed mobility  comments: assist for LUE/LE    Transfers Overall transfer level: Needs assistance Equipment used: None Transfers: Bed to chair/wheelchair/BSC       Squat pivot transfers: Max assist          Ambulation/Gait                   Stairs             Wheelchair Mobility    Modified Rankin (Stroke Patients Only)       Balance Overall balance assessment: Needs assistance Sitting-balance support: Feet supported Sitting balance-Leahy Scale: Fair     Standing balance support: Single extremity supported Standing balance-Leahy Scale: Zero                              Cognition Arousal/Alertness: Awake/alert Behavior During Therapy: WFL for tasks assessed/performed Overall Cognitive Status: Within Functional Limits for tasks assessed                                          Exercises      General Comments        Pertinent Vitals/Pain Pain Assessment Pain Assessment: 0-10 Pain Score: 6  Pain Location: hands and low back Pain Descriptors / Indicators: Sore, Aching, Burning Pain Intervention(s): Limited activity within patient's tolerance, Monitored during session, Patient requesting pain meds-RN notified    Home Living  Prior Function            PT Goals (current goals can now be found in the care plan section) Progress towards PT goals: Progressing toward goals    Frequency    Min 2X/week      PT Plan Current plan remains appropriate    Co-evaluation              AM-PAC PT "6 Clicks" Mobility   Outcome Measure  Help needed turning from your back to your side while in a flat bed without using bedrails?: A Lot Help needed moving from lying on your back to sitting on the side of a flat bed without using bedrails?: A Lot Help needed moving to and from a bed to a chair (including a wheelchair)?: A Lot Help needed standing up from a chair using your arms (e.g.,  wheelchair or bedside chair)?: A Lot Help needed to walk in hospital room?: Total Help needed climbing 3-5 steps with a railing? : Total 6 Click Score: 10    End of Session   Activity Tolerance: Patient tolerated treatment well Patient left: with chair alarm set;in chair;with call bell/phone within reach Nurse Communication: Mobility status PT Visit Diagnosis: Muscle weakness (generalized) (M62.81);Other abnormalities of gait and mobility (R26.89)     Time: 8469-6295 PT Time Calculation (min) (ACUTE ONLY): 15 min  Charges:  $Therapeutic Activity: 8-22 mins                     Olga Coaster PT, DPT 11:42 AM,07/27/22

## 2022-07-27 NOTE — Progress Notes (Signed)
[facility] Progress Note  Patient: Steve Alvarado    ZOX:[id number]  DOA: 07/24/2022     Date of Service: the patient was seen and examined on 07/27/2022  Chief Complaint  Patient presents with   Hyperglycemia   Brief hospital course: SIDDHANTH EVENER is a 67 y.o. African-American male with medical history significant for diastolic CHF, stage II chronic kidney disease, cocaine abuse, coronary artery disease, depression, type 2 diabetes mellitus, hypertension, hypothyroidism and dyslipidemia, who presented to the emergency room with acute onset of intractable diarrhea with associated nausea and vomiting.   ED Course: When he came to the ER BP was 151/81 with respiratory rate of 21 with otherwise normal vital signs.  Latest BP was 161/88 with otherwise normal vital signs.  Labs revealed a blood glucose of 264 and CBC with hemoglobin 11 and hematocrit 33 point and UA that was positive for UTI with more than 500 glucose. EKG as reviewed by me : EKG showed sinus rhythm with a rate of 88 with first-degree AV block and PVCs as well as PACs and T wave inversion inferiorly with poor R wave progression. Imaging: Portable chest x-ray showed no acute cardiopulmonary disease.  Assessment and Plan:  Acute gastroenteritis could be secondary to uncontrolled diabetes, patient had elevated blood glucose and mild metabolic acidosis, anion gap was 15 within normal range Negative, no diarrhea since morning of 5/7, Symptoms resolved, Continue IV fluid for hydration  Uncontrolled diabetes mellitus type 2 insulin-dependent, patient presented with hyperglycemia, glucose is improving.  Hemoglobin A1c 11.1 Started Semglee 20 units nightly, NovoLog 5 units 3 times daily and sliding scale Monitor CBG, continue diabetic diet Patient is noncompliance with insulin, counseling done. Neuropathy, resumed Lyrica 50 mg p.o. 3 times daily home dose   AKI due to uncontrolled diabetes and dehydration Creatinine 1.62  on admission Creatinine 1.32, gradually improving IV fluid for hydration, monitor renal functions and urine output   UTI, UA positive, urine culture growing E. coli, sensitive report is pending. Continue ceftriaxone 1 g IV daily  Chronic diastolic CHF, CAD, HLD Resume aspirin, Plavix, Entresto, Coreg, amlodipine, Lipitor Monitor BP and titrate medications accordingly  Magnesium, mag repleted. Monitor electrolytes and replete as needed.  Anemia of chronic disease, hemoglobin 11.2 on admission, dropped to 9.1 most likely due to dilution No signs of bleeding Continue to monitor H&H and transfuse if hemoglobin less than 7  GERD with esophagitis Continue PPI therapy.  Hypothyroid, continued Synthroid Depression, resumed Celexa and Cymbalta  Body mass index is 24.39 kg/m.  Interventions:       Diet: Diabetic diet DVT Prophylaxis: Subcutaneous Lovenox   Advance goals of care discussion: Full code  Family Communication: family was not present at bedside, at the time of interview.  The pt provided permission to discuss medical plan with the family. Opportunity was given to ask question and all questions were answered satisfactorily.   Disposition:  Pt is from home, admitted with uncontrolled diabetes, abdominal pain and nausea vomiting, developed AKI and UTI, still has elevated creatinine and urine cultures pending, which precludes a safe discharge. Discharge to SNF, most likely tomorrow a.m.  Subjective: No significant events overnight, patient was sitting comfortably on the recliner.  Patient has chronic lower back pain and pain in the hands which is chronic.  No any other acute issues.  Denies any nausea vomiting or diarrhea.  Physical Exam: General: NAD, lying comfortably Appear in no distress, affect appropriate Eyes: PERRLA ENT: Oral Mucosa Clear, moist  Neck:  no JVD,  Cardiovascular: S1 and S2 Present, no Murmur,  Respiratory: good respiratory effort, Bilateral Air  entry equal and Decreased, no Crackles, no wheezes Abdomen: Bowel Sound present, Soft and no tenderness,  Skin: no rashes Extremities: no Pedal edema, no calf tenderness Neurologic: without any new focal findings Gait not checked due to patient safety concerns  Vitals:   07/26/22 2317 07/27/22 0558 07/27/22 0814 07/27/22 1608  BP:  116/64 135/75 131/65  Pulse:  68 67 74  Resp:  16 20 20   Temp:  98.3 F (36.8 C) 98.3 F (36.8 C) 98.4 F (36.9 C)  TempSrc: Oral     SpO2:  100% 100% 99%  Weight:      Height: 6\' 2"  (1.88 m)       Intake/Output Summary (Last 24 hours) at 07/27/2022 1622 Last data filed at 07/27/2022 0500 Gross per 24 hour  Intake 1811.87 ml  Output --  Net 1811.87 ml   Filed Weights   07/24/22 1941  Weight: 86.2 kg    Data Reviewed: I have personally reviewed and interpreted daily labs, tele strips, imagings as discussed above. I reviewed all nursing notes, pharmacy notes, vitals, pertinent old records I have discussed plan of care as described above with RN and patient/family.  CBC: Recent Labs  Lab 07/25/22 0023 07/25/22 2101 07/26/22 0456 07/27/22 0623  WBC 6.4 7.9 8.2 4.8  NEUTROABS  --  4.6  --   --   HGB 11.2* 11.0* 9.5* 9.1*  HCT 34.6* 33.2* 29.2* 27.2*  MCV 88.3 85.8 86.4 86.3  PLT 222 311 273 259   Basic Metabolic Panel: Recent Labs  Lab 07/25/22 0023 07/25/22 2101 07/26/22 0456 07/27/22 0623  NA 130* 136 136 138  K 4.3 3.7 3.7 4.0  CL 96* 101 106 106  CO2 19* 24 24 26   GLUCOSE 465* 264* 200* 203*  BUN 25* 23 21 23   CREATININE 1.62* 1.19 1.01 1.32*  CALCIUM 8.9 8.8* 8.3* 8.3*  MG  --   --   --  1.5*  PHOS  --   --   --  3.3    Studies: No results found.  Scheduled Meds:  amLODipine  10 mg Oral q morning   aspirin EC  81 mg Oral Daily   atorvastatin  40 mg Oral Daily   carvedilol  3.125 mg Oral BID WC   citalopram  20 mg Oral Daily   clopidogrel  75 mg Oral QHS   DULoxetine  60 mg Oral Daily   enoxaparin (LOVENOX)  injection  40 mg Subcutaneous Q24H   ferrous sulfate  325 mg Oral QODAY   insulin aspart  0-15 Units Subcutaneous TID WC   insulin aspart  0-5 Units Subcutaneous QHS   insulin aspart  5 Units Subcutaneous TID WC   insulin glargine-yfgn  20 Units Subcutaneous QHS   levothyroxine  75 mcg Oral QHS   lidocaine  1 patch Transdermal Q24H   melatonin  5 mg Oral QHS   pregabalin  50 mg Oral TID   sacubitril-valsartan  1 tablet Oral BID   Continuous Infusions:  sodium chloride 75 mL/hr at 07/27/22 1356   cefTRIAXone (ROCEPHIN)  IV 1 g (07/27/22 0102)   PRN Meds: acetaminophen **OR** acetaminophen, albuterol, magnesium hydroxide, [DISCONTINUED] ondansetron **OR** ondansetron (ZOFRAN) IV, ondansetron, oxyCODONE, polyethylene glycol, traZODone  Time spent: 35 minutes  Author: Gillis Santa. MD [facility] 07/27/2022 4:22 PM  To reach On-call, see care teams to locate the attending and reach  out to them via [email or web address]. If 7PM-7AM, please contact night-coverage If you still have difficulty reaching the attending provider, please page the [facility] (Director on Call) for [facility] on amion for assistance.

## 2022-07-27 NOTE — TOC Progression Note (Signed)
Transition of Care [facility]) - Progression Note    Patient Details  Name: Steve Alvarado MRN: [id number] Date of Birth: [birth year]  Transition of Care [facility]) CM/SW Contact  Chapman Fitch, RN Phone Number: 07/27/2022, 1:50 PM  Clinical Narrative:     Per MD anticipated discharge to [facility] tomorrow.  [facility] updated   Expected Discharge Plan: Skilled Nursing Facility Barriers to Discharge: SNF Pending bed offer, Insurance Authorization  Expected Discharge Plan and Services     Post Acute Care Choice: Skilled Nursing Facility Living arrangements for the past 2 months: Apartment                                       Social Determinants of Health (SDOH) Interventions SDOH Screenings   Food Insecurity: No Food Insecurity (07/26/2022)  Housing: Low Risk  (07/26/2022)  Transportation Needs: No Transportation Needs (07/26/2022)  Utilities: Not At Risk (07/26/2022)  Depression (PHQ2-9): Low Risk  (12/31/2021)  Tobacco Use: Low Risk  (07/24/2022)    Readmission Risk Interventions    11/08/2021   11:09 AM  Readmission Risk Prevention Plan  Transportation Screening Complete  Medication Review (RN Care Manager) Complete  PCP or Specialist appointment within 3-5 days of discharge Complete  SW Recovery Care/Counseling Consult Complete  Palliative Care Screening Not Applicable  Skilled Nursing Facility Complete

## 2022-07-28 LAB — CBC
HCT: 27.5 % — ABNORMAL LOW (ref 39.0–52.0)
Hemoglobin: 9 g/dL — ABNORMAL LOW (ref 13.0–17.0)
MCH: 28.1 pg (ref 26.0–34.0)
MCHC: 32.7 g/dL (ref 30.0–36.0)
MCV: 85.9 fL (ref 80.0–100.0)
Platelets: 264 10*3/uL (ref 150–400)
RBC: 3.2 MIL/uL — ABNORMAL LOW (ref 4.22–5.81)
RDW: 12.8 % (ref 11.5–15.5)
WBC: 6.6 10*3/uL (ref 4.0–10.5)
nRBC: 0 % (ref 0.0–0.2)

## 2022-07-28 LAB — BASIC METABOLIC PANEL
Anion gap: 4 — ABNORMAL LOW (ref 5–15)
BUN: 22 mg/dL (ref 8–23)
CO2: 24 mmol/L (ref 22–32)
Calcium: 8 mg/dL — ABNORMAL LOW (ref 8.9–10.3)
Chloride: 108 mmol/L (ref 98–111)
Creatinine, Ser: 1 mg/dL (ref 0.61–1.24)
GFR, Estimated: >60 mL/min (ref >60)
Glucose, Bld: 175 mg/dL — ABNORMAL HIGH (ref 70–99)
Potassium: 4.3 mmol/L (ref 3.5–5.1)
Sodium: 136 mmol/L (ref 135–145)

## 2022-07-28 LAB — GLUCOSE, CAPILLARY
Glucose-Capillary: 136 mg/dL — ABNORMAL HIGH (ref 70–99)
Glucose-Capillary: 241 mg/dL — ABNORMAL HIGH (ref 70–99)

## 2022-07-28 LAB — URINE CULTURE

## 2022-07-28 LAB — MAGNESIUM: Magnesium: 2 mg/dL (ref 1.7–2.4)

## 2022-07-28 LAB — PHOSPHORUS: Phosphorus: 2.7 mg/dL (ref 2.5–4.6)

## 2022-07-28 MED ORDER — TRAZODONE HCL 50 MG PO TABS: 25.0000 mg | ORAL_TABLET | Freq: Every evening | ORAL | Status: DC | PRN

## 2022-07-28 MED ORDER — AMOXICILLIN-POT CLAVULANATE 875-125 MG PO TABS: 1.0000 | ORAL_TABLET | Freq: Two times a day (BID) | ORAL | 0 refills | Status: AC

## 2022-07-28 MED ORDER — FUROSEMIDE 20 MG PO TABS: 20.0000 mg | ORAL_TABLET | ORAL | 11 refills | Status: DC

## 2022-07-28 MED ORDER — PREGABALIN 50 MG PO CAPS: 50.0000 mg | ORAL_CAPSULE | Freq: Three times a day (TID) | ORAL | 0 refills | Status: DC

## 2022-07-28 NOTE — Plan of Care (Signed)
  Problem: Skin Integrity: Goal: Risk for impaired skin integrity will decrease Outcome: Progressing   Problem: Nutritional: Goal: Maintenance of adequate nutrition will improve Outcome: Progressing   

## 2022-07-28 NOTE — Progress Notes (Signed)
EMS here to transport the patient 

## 2022-07-28 NOTE — Discharge Summary (Addendum)
[facility] Discharge Summary   Patient: Steve Alvarado ZOX:[id number]  PCP: System, Provider Not In  Date of admission: 07/24/2022   Date of discharge:  07/28/2022     Discharge Diagnoses:  Principal Problem:   Intractable diarrhea Active Problems:   Acute lower UTI   AKI (acute kidney injury) (HCC)   Uncontrolled type 2 diabetes mellitus with hyperglycemia, with long-term current use of insulin (HCC)   Essential hypertension   Hypothyroidism   Chronic diastolic CHF (congestive heart failure) (HCC)   Coronary artery disease   GERD with esophagitis   Admitted From: Home Disposition:  SNF   Recommendations for Outpatient Follow-up:  Follow-up with PCP, patient should be seen by an MD in 1 to 2 days, repeat BMP after 1 week, continue to monitor serum glucose level and use insulin accordingly.  Continue with 3 days more antibiotics for UTI, AKI resolved, resumed Lasix every other day, check hydration/volume status and titrate dose accordingly. Follow up LABS/TEST:  CBC and BMP in 1 wk   Contact information for after-discharge care     Destination     [facility] Preferred SNF .   Service: Skilled Nursing Contact information: [address] [place] [address] [phone]                    Diet recommendation: Cardiac and Carb modified diet  Activity: The patient is advised to gradually reintroduce usual activities, as tolerated  Discharge Condition: stable  Code Status: Full code   History of present illness: As per the H and P dictated on admission Hospital Course:  Steve Alvarado is a 67 y.o. African-American male with medical history significant for diastolic CHF, stage II chronic kidney disease, cocaine abuse, coronary artery disease, depression, type 2 diabetes mellitus, hypertension, hypothyroidism and dyslipidemia, who presented to the emergency room with acute onset of intractable diarrhea with associated  nausea and vomiting.    ED Course: When he came to the ER BP was 151/81 with respiratory rate of 21 with otherwise normal vital signs.  Latest BP was 161/88 with otherwise normal vital signs.  Labs revealed a blood glucose of 264 and CBC with hemoglobin 11 and hematocrit 33 point and UA that was positive for UTI with more than 500 glucose. EKG as reviewed by me : EKG showed sinus rhythm with a rate of 88 with first-degree AV block and PVCs as well as PACs and T wave inversion inferiorly with poor R wave progression. Imaging: Portable chest x-ray showed no acute cardiopulmonary disease.   Assessment and Plan: # Acute gastroenteritis could be secondary to uncontrolled diabetes, patient had elevated blood glucose and mild metabolic acidosis, anion gap was 15 within normal range.  C. difficile negative, no diarrhea since morning of 5/7, Symptoms resolved. S/p IVF.  Continue oral hydration. # Uncontrolled diabetes mellitus type 2 insulin-dependent, patient presented with hyperglycemia, glucose is improving.  Hemoglobin A1c 11.1 S/p Semglee 20 units nightly, NovoLog 5 units 3 times daily and sliding scale Patient is noncompliance with insulin, counseling done. Monitor CBG, continue diabetic diet. Neuropathy, resumed Lyrica 50 mg p.o. 3 times daily home dose Resumed home dose Lantus 20 units and continue NovoLog sliding scale.   # AKI due to uncontrolled diabetes and dehydration, Creatinine 1.62 on admission, Creatinine 1.32--1.00 today, AKI resolved s/p IVF.  Changed to Lasix 20 mg every other day to prevent dehydration.  Monitor volume status.  BMP in 1 week # UTI, UA  positive, urine culture growing E. coli, pansensitive. S/p ceftriaxone 1 g IV daily given during hospital stay, transition to oral Augmentin twice daily for 3 additional days on discharge. # Chronic diastolic CHF, CAD, HLD Resume aspirin, Plavix, Entresto, Coreg, amlodipine, Lipitor Changed to Lasix 20 mg p.o. every other day, monitor volume  status and then titrate dose accordingly.  Advised to follow with cardiology, I do not know why patient is on Entresto with a diastolic CHF? Monitor BP and titrate medications accordingly # Magnesium, mag repleted.  Resolved Anemia of chronic disease, hemoglobin 11.2 on admission, dropped to 9.1 most likely due to dilution. No signs of bleeding.  Hb 9.0 stable, repeat CBC after 1 week GERD with esophagitis, Continue PPI therapy. Hypothyroid, continued Synthroid Depression, resumed Celexa and Cymbalta Body mass index is 22.96 kg/m.  Nutrition Interventions:   Patient was seen by physical therapy, who recommended Therapy, SNF placement, which was arranged. On the day of the discharge the patient's vitals were stable, and no other acute medical condition were reported by patient. the patient was felt safe to be discharge at [facility].  Consultants: None Procedures: None  Discharge Exam: General: Appear in no distress, no Rash; Oral Mucosa Clear, moist. Cardiovascular: S1 and S2 Present, no Murmur, Respiratory: normal respiratory effort, Bilateral Air entry present and no Crackles, no wheezes Abdomen: Bowel Sound present, Soft and no tenderness, no hernia Extremities: no Pedal edema, no calf tenderness Neurology: alert and oriented to time, place, and person affect appropriate.  Filed Weights   07/24/22 1941 07/28/22 0529  Weight: 86.2 kg 81.1 kg   Vitals:   07/28/22 0433 07/28/22 0900  BP: (!) 148/73 136/75  Pulse: 73 72  Resp: 16 16  Temp: 98.3 F (36.8 C) 98.3 F (36.8 C)  SpO2: 100% 100%    DISCHARGE MEDICATION: Allergies as of 07/28/2022       Reactions   Ace Inhibitors Cough   Reaction not listed on MAR        Medication List     TAKE these medications    albuterol 108 (90 Base) MCG/ACT inhaler Commonly known as: VENTOLIN HFA Inhale 2 puffs into the lungs every 6 (six) hours as needed for wheezing.   amLODipine 10 MG tablet Commonly known as: NORVASC Take 1  tablet (10 mg total) by mouth every morning.   amoxicillin-clavulanate 875-125 MG tablet Commonly known as: AUGMENTIN Take 1 tablet by mouth 2 (two) times daily for 3 days.   aspirin EC 81 MG tablet Take 1 tablet (81 mg total) by mouth daily. Swallow whole.   atorvastatin 40 MG tablet Commonly known as: LIPITOR Take 1 tablet (40 mg total) by mouth every evening. What changed: when to take this   carvedilol 3.125 MG tablet Commonly known as: COREG Take 1 tablet (3.125 mg total) by mouth 2 (two) times daily with a meal. What changed: when to take this   citalopram 20 MG tablet Commonly known as: CELEXA Take 1 tablet (20 mg total) by mouth daily.   clopidogrel 75 MG tablet Commonly known as: PLAVIX Take 1 tablet (75 mg total) by mouth daily. What changed: when to take this   DULoxetine 30 MG capsule Commonly known as: CYMBALTA Take 60 mg by mouth daily.   ferrous sulfate 325 (65 FE) MG EC tablet Take 1 tablet (325 mg total) by mouth every other day.   furosemide 20 MG tablet Commonly known as: Lasix Take 1 tablet (20 mg total) by mouth every other day.  Monitor volume status and change dose accordingly. What changed:  when to take this additional instructions   HumaLOG 100 UNIT/ML injection Generic drug: insulin lispro Inject 0.03 mLs (3 Units total) into the skin 3 (three) times daily with meals.   Lantus SoloStar 100 UNIT/ML Solostar Pen Generic drug: insulin glargine Inject 20 Units into the skin at bedtime.   levothyroxine 75 MCG tablet Commonly known as: SYNTHROID Take 75 mcg by mouth at bedtime. 2200   lidocaine 4 % Place 1 patch onto the skin daily. Apply one patch topically to left abdomen every morning - leave on for 12 hours. 12 hours on and 12 hours off. What changed: Another medication with the same name was removed. Continue taking this medication, and follow the directions you see here.   melatonin 5 MG Tabs Take 5 mg by mouth at bedtime.    polyethylene glycol 17 g packet Commonly known as: MIRALAX / GLYCOLAX Take 17 g by mouth daily as needed for mild constipation.   pregabalin 50 MG capsule Commonly known as: LYRICA Take 1 capsule (50 mg total) by mouth 3 (three) times daily.   sacubitril-valsartan 24-26 MG Commonly known as: ENTRESTO Take 1 tablet by mouth 2 (two) times daily.   traZODone 50 MG tablet Commonly known as: DESYREL Take 0.5 tablets (25 mg total) by mouth at bedtime as needed for sleep.       Allergies  Allergen Reactions   Ace Inhibitors Cough    Reaction not listed on [facility]   Discharge Instructions     Call MD for:  difficulty breathing, headache or visual disturbances   Complete by: As directed    Call MD for:  extreme fatigue   Complete by: As directed    Call MD for:  persistant dizziness or light-headedness   Complete by: As directed    Call MD for:  persistant nausea and vomiting   Complete by: As directed    Call MD for:  severe uncontrolled pain   Complete by: As directed    Call MD for:  temperature >100.4   Complete by: As directed    Diet - low sodium heart healthy   Complete by: As directed    Discharge instructions   Complete by: As directed    Follow-up with PCP, patient should be seen by an MD in 1 to 2 days, repeat BMP after 1 week, continue to monitor serum glucose level and use insulin accordingly.  Continue with 3 days more antibiotics for UTI, AKI resolved, resumed Lasix every other day, check hydration/volume status and titrate dose accordingly.   Increase activity slowly   Complete by: As directed        The results of significant diagnostics from this hospitalization (including imaging, microbiology, ancillary and laboratory) are listed below for reference.    Significant Diagnostic Studies: CT ABDOMEN PELVIS W CONTRAST  Result Date: 07/25/2022 CLINICAL DATA:  Abdominal pain. EXAM: CT ABDOMEN AND PELVIS WITH CONTRAST TECHNIQUE: Multidetector CT imaging of the  abdomen and pelvis was performed using the standard protocol following bolus administration of intravenous contrast. RADIATION DOSE REDUCTION: This exam was performed according to the departmental dose-optimization program which includes automated exposure control, adjustment of the mA and/or kV according to patient size and/or use of iterative reconstruction technique. CONTRAST:  OMNIPAQUE IOHEXOL 300 MG/ML  SOLN COMPARISON:  CT abdomen pelvis dated 04/26/2022. FINDINGS: Lower chest: The visualized lung bases are clear. There is coronary vascular calcification. No intra-abdominal free air or  free fluid. Hepatobiliary: Apparent fatty liver. No biliary ductal dilatation. Probable gallbladder sludge versus noncalcified stone. Ultrasound is recommended for better evaluation. Pancreas: Unremarkable. No pancreatic ductal dilatation or surrounding inflammatory changes. Spleen: Normal in size without focal abnormality. Adrenals/Urinary Tract: The adrenal glands are unremarkable. Small left renal upper pole cyst. There is no hydronephrosis on either side. The visualized ureters appear unremarkable. Mild thickened appearance of the bladder wall. Correlation with urinalysis recommended to exclude cystitis. Stomach/Bowel: Mild thickened appearance of the rectosigmoid which may be related to underdistention. Mild proctocolitis is not excluded. Clinical correlation is recommended. No bowel obstruction. The appendix is normal. Vascular/Lymphatic: Mild aortoiliac atherosclerotic disease. The IVC is unremarkable. No portal venous gas. There is no adenopathy. Reproductive: The prostate and seminal vesicles are grossly unremarkable. No pelvic mass. The left testicle is located in the inguinal canal. Other: None Musculoskeletal: Chronic fracture of the posterior left eleventh and twelfth ribs. No acute osseous pathology. IMPRESSION: 1. Underdistention of the rectosigmoid versus mild proctocolitis. No bowel obstruction. Normal  appendix. 2. Probable gallbladder sludge versus noncalcified stone. Ultrasound is recommended for better evaluation. 3. Mild thickened appearance of the bladder wall. Correlation with urinalysis recommended to exclude cystitis. 4.  Aortic Atherosclerosis (ICD10-I70.0). Electronically Signed   By: Elgie Collard M.D.   On: 07/25/2022 23:12   DG Chest 1 View  Result Date: 07/25/2022 CLINICAL DATA:  Weakness EXAM: CHEST  1 VIEW COMPARISON:  04/01/2022 FINDINGS: The heart size and mediastinal contours are within normal limits. Both lungs are clear. The visualized skeletal structures are unremarkable. IMPRESSION: No active disease. Electronically Signed   By: Charlett Nose M.D.   On: 07/25/2022 00:30    Microbiology: Recent Results (from the past 240 hour(s))  Urine Culture     Status: Abnormal   Collection Time: 07/25/22 11:36 PM   Specimen: Urine, Clean Catch  Result Value Ref Range Status   Specimen Description   Final    URINE, CLEAN CATCH Performed at [facility], [address] [place], [place] [address]    Special Requests   Final    NONE Performed at [facility], [address], [place], [place] [address]    Culture (A)  Final    >=100,000 COLONIES/mL ESCHERICHIA COLI >=100,000 COLONIES/mL GROUP B STREP(S.AGALACTIAE)ISOLATED TESTING AGAINST S. AGALACTIAE NOT ROUTINELY PERFORMED DUE TO PREDICTABILITY OF AMP/PEN/VAN SUSCEPTIBILITY. Performed at [facility], [address], [place], [place] [address]    Report Status 07/28/2022 FINAL  Final   Organism ID, Bacteria ESCHERICHIA COLI (A)  Final      Susceptibility   Escherichia coli - MIC*    AMPICILLIN 4 SENSITIVE Sensitive     CEFAZOLIN <=4 SENSITIVE Sensitive     CEFEPIME <=0.12 SENSITIVE Sensitive     CEFTRIAXONE <=0.25 SENSITIVE Sensitive     CIPROFLOXACIN <=0.25 SENSITIVE Sensitive     GENTAMICIN <=1 SENSITIVE Sensitive     IMIPENEM <=0.25 SENSITIVE Sensitive     NITROFURANTOIN <=16 SENSITIVE  Sensitive     TRIMETH/SULFA <=20 SENSITIVE Sensitive     AMPICILLIN/SULBACTAM <=2 SENSITIVE Sensitive     PIP/TAZO <=4 SENSITIVE Sensitive     * >=100,000 COLONIES/mL ESCHERICHIA COLI  C Difficile Quick Screen w PCR reflex     Status: None   Collection Time: 07/25/22 11:38 PM   Specimen: STOOL  Result Value Ref Range Status   C Diff antigen NEGATIVE NEGATIVE Final   C Diff toxin NEGATIVE NEGATIVE Final   C Diff interpretation No C.  difficile detected.  Final    Comment: Performed at [facility], [address] [place], [place] [address]     Labs: CBC: Recent Labs  Lab 07/25/22 0023 07/25/22 2101 07/26/22 0456 07/27/22 0623 07/28/22 0617  WBC 6.4 7.9 8.2 4.8 6.6  NEUTROABS  --  4.6  --   --   --   HGB 11.2* 11.0* 9.5* 9.1* 9.0*  HCT 34.6* 33.2* 29.2* 27.2* 27.5*  MCV 88.3 85.8 86.4 86.3 85.9  PLT 222 311 273 259 264   Basic Metabolic Panel: Recent Labs  Lab 07/25/22 0023 07/25/22 2101 07/26/22 0456 07/27/22 0623 07/28/22 0617  NA 130* 136 136 138 136  K 4.3 3.7 3.7 4.0 4.3  CL 96* 101 106 106 108  CO2 19* 24 24 26 24   GLUCOSE 465* 264* 200* 203* 175*  BUN 25* 23 21 23 22   CREATININE 1.62* 1.19 1.01 1.32* 1.00  CALCIUM 8.9 8.8* 8.3* 8.3* 8.0*  MG  --   --   --  1.5* 2.0  PHOS  --   --   --  3.3 2.7   Liver Function Tests: Recent Labs  Lab 07/25/22 2101  AST 15  ALT 9  ALKPHOS 90  BILITOT 0.5  PROT 7.6  ALBUMIN 3.1*   No results for input(s): "LIPASE", "AMYLASE" in the last 168 hours. No results for input(s): "AMMONIA" in the last 168 hours. Cardiac Enzymes: No results for input(s): "CKTOTAL", "CKMB", "CKMBINDEX", "TROPONINI" in the last 168 hours. BNP (last 3 results) Recent Labs    11/07/21 1442  BNP 464.5*   CBG: Recent Labs  Lab 07/27/22 1222 07/27/22 1633 07/27/22 2130 07/28/22 0823 07/28/22 1132  GLUCAP 182* 131* 140* 136* 241*    Time spent: 35 minutes  Signed:  Gillis Santa  [facility] 07/28/2022 1:17 PM

## 2022-07-28 NOTE — TOC Transition Note (Signed)
Transition of Care [facility]) - CM/SW Discharge Note   Patient Details  Name: Steve Alvarado MRN: [id number] Date of Birth: November 06, 1955  Transition of Care [facility] CM/SW Contact:  Chapman Fitch, RN Phone Number: 07/28/2022, 12:27 PM   Clinical Narrative:     Patient will DC to: [facility] Anticipated DC date: 07/28/22  Family notified: Patient declines for [facility] to notify family.  He states that his wife is currently a patient at [facility], and he will update her when he arrives  Transport OZ:[id number]  Per MD patient ready for DC to . RN, patient,, and facility notified of DC. Discharge Summary sent to facility. RN given number for report. DC packet on chart. Ambulance transport to be requested for patient by bedside RN.  TOC signing off.  Bevelyn Ngo [facility] [phone]     Barriers to Discharge: SNF Pending bed offer, Insurance Authorization   Patient Goals and CMS Choice      Discharge Placement                         Discharge Plan and Services Additional resources added to the After Visit Summary for       Post Acute Care Choice: Skilled Nursing Facility                               Social Determinants of Health (SDOH) Interventions SDOH Screenings   Food Insecurity: No Food Insecurity (07/26/2022)  Housing: Low Risk  (07/26/2022)  Transportation Needs: No Transportation Needs (07/26/2022)  Utilities: Not At Risk (07/26/2022)  Depression (PHQ2-9): Low Risk  (12/31/2021)  Tobacco Use: Low Risk  (07/24/2022)     Readmission Risk Interventions    11/08/2021   11:09 AM  Readmission Risk Prevention Plan  Transportation Screening Complete  Medication Review (RN Care Manager) Complete  PCP or Specialist appointment within 3-5 days of discharge Complete  SW Recovery Care/Counseling Consult Complete  Palliative Care Screening Not Applicable  Skilled Nursing Facility Complete

## 2022-08-27 ENCOUNTER — Encounter: Payer: Self-pay | Admitting: Emergency Medicine

## 2022-08-27 ENCOUNTER — Other Ambulatory Visit: Payer: Self-pay

## 2022-08-27 DIAGNOSIS — N3000 Acute cystitis without hematuria: Secondary | ICD-10-CM | POA: Diagnosis not present

## 2022-08-27 DIAGNOSIS — G4700 Insomnia, unspecified: Secondary | ICD-10-CM | POA: Diagnosis present

## 2022-08-27 DIAGNOSIS — Z8249 Family history of ischemic heart disease and other diseases of the circulatory system: Secondary | ICD-10-CM

## 2022-08-27 DIAGNOSIS — E1122 Type 2 diabetes mellitus with diabetic chronic kidney disease: Secondary | ICD-10-CM | POA: Diagnosis present

## 2022-08-27 DIAGNOSIS — Z6823 Body mass index (BMI) 23.0-23.9, adult: Secondary | ICD-10-CM

## 2022-08-27 DIAGNOSIS — F32A Depression, unspecified: Secondary | ICD-10-CM | POA: Diagnosis present

## 2022-08-27 DIAGNOSIS — E1165 Type 2 diabetes mellitus with hyperglycemia: Secondary | ICD-10-CM | POA: Diagnosis present

## 2022-08-27 DIAGNOSIS — B9620 Unspecified Escherichia coli [E. coli] as the cause of diseases classified elsewhere: Secondary | ICD-10-CM | POA: Diagnosis present

## 2022-08-27 DIAGNOSIS — I2510 Atherosclerotic heart disease of native coronary artery without angina pectoris: Secondary | ICD-10-CM | POA: Diagnosis present

## 2022-08-27 DIAGNOSIS — E039 Hypothyroidism, unspecified: Secondary | ICD-10-CM | POA: Diagnosis present

## 2022-08-27 DIAGNOSIS — N179 Acute kidney failure, unspecified: Secondary | ICD-10-CM | POA: Diagnosis present

## 2022-08-27 DIAGNOSIS — Z7982 Long term (current) use of aspirin: Secondary | ICD-10-CM

## 2022-08-27 DIAGNOSIS — E785 Hyperlipidemia, unspecified: Secondary | ICD-10-CM | POA: Diagnosis present

## 2022-08-27 DIAGNOSIS — Z7902 Long term (current) use of antithrombotics/antiplatelets: Secondary | ICD-10-CM

## 2022-08-27 DIAGNOSIS — Z79899 Other long term (current) drug therapy: Secondary | ICD-10-CM

## 2022-08-27 DIAGNOSIS — Z794 Long term (current) use of insulin: Secondary | ICD-10-CM

## 2022-08-27 DIAGNOSIS — Z79890 Hormone replacement therapy (postmenopausal): Secondary | ICD-10-CM

## 2022-08-27 DIAGNOSIS — N390 Urinary tract infection, site not specified: Secondary | ICD-10-CM | POA: Diagnosis not present

## 2022-08-27 DIAGNOSIS — D649 Anemia, unspecified: Secondary | ICD-10-CM | POA: Diagnosis present

## 2022-08-27 DIAGNOSIS — Z91148 Patient's other noncompliance with medication regimen for other reason: Secondary | ICD-10-CM

## 2022-08-27 DIAGNOSIS — N182 Chronic kidney disease, stage 2 (mild): Secondary | ICD-10-CM | POA: Diagnosis present

## 2022-08-27 DIAGNOSIS — I5032 Chronic diastolic (congestive) heart failure: Secondary | ICD-10-CM | POA: Diagnosis present

## 2022-08-27 DIAGNOSIS — I130 Hypertensive heart and chronic kidney disease with heart failure and stage 1 through stage 4 chronic kidney disease, or unspecified chronic kidney disease: Secondary | ICD-10-CM | POA: Diagnosis present

## 2022-08-27 DIAGNOSIS — E440 Moderate protein-calorie malnutrition: Secondary | ICD-10-CM | POA: Diagnosis present

## 2022-08-27 NOTE — ED Triage Notes (Signed)
Pt presents via [facility] from home with complaints for signs of neglect. Pt came in with foul urine and soiled brief. He notes not being able to receive his medications - Hx of DM and hasn't had meds in over a week. Pt states his primary caretaker left and he has was of taking care of himself. A&Ox4 this time. Denies CP or SOB.    CBG -321

## 2022-08-28 ENCOUNTER — Inpatient Hospital Stay
Admission: EM | Admit: 2022-08-28 | Discharge: 2022-09-01 | DRG: 690 | Disposition: A | Discharge status: Skilled Nursing Facility | Payer: 59 | Attending: Internal Medicine | Admitting: Internal Medicine

## 2022-08-28 DIAGNOSIS — F32A Depression, unspecified: Secondary | ICD-10-CM | POA: Diagnosis present

## 2022-08-28 DIAGNOSIS — E440 Moderate protein-calorie malnutrition: Secondary | ICD-10-CM | POA: Diagnosis present

## 2022-08-28 DIAGNOSIS — N390 Urinary tract infection, site not specified: Secondary | ICD-10-CM | POA: Diagnosis present

## 2022-08-28 DIAGNOSIS — B9620 Unspecified Escherichia coli [E. coli] as the cause of diseases classified elsewhere: Secondary | ICD-10-CM | POA: Diagnosis present

## 2022-08-28 DIAGNOSIS — I10 Essential (primary) hypertension: Secondary | ICD-10-CM | POA: Diagnosis present

## 2022-08-28 DIAGNOSIS — E1165 Type 2 diabetes mellitus with hyperglycemia: Secondary | ICD-10-CM

## 2022-08-28 DIAGNOSIS — N179 Acute kidney failure, unspecified: Secondary | ICD-10-CM | POA: Diagnosis present

## 2022-08-28 DIAGNOSIS — D649 Anemia, unspecified: Secondary | ICD-10-CM

## 2022-08-28 DIAGNOSIS — I2510 Atherosclerotic heart disease of native coronary artery without angina pectoris: Secondary | ICD-10-CM | POA: Diagnosis present

## 2022-08-28 DIAGNOSIS — E039 Hypothyroidism, unspecified: Secondary | ICD-10-CM | POA: Diagnosis present

## 2022-08-28 DIAGNOSIS — N3000 Acute cystitis without hematuria: Principal | ICD-10-CM

## 2022-08-28 DIAGNOSIS — I5032 Chronic diastolic (congestive) heart failure: Secondary | ICD-10-CM | POA: Diagnosis present

## 2022-08-28 DIAGNOSIS — Z789 Other specified health status: Secondary | ICD-10-CM

## 2022-08-28 LAB — CBC WITH DIFFERENTIAL/PLATELET
Abs Immature Granulocytes: 0.01 10*3/uL (ref 0.00–0.07)
Basophils Absolute: 0 10*3/uL (ref 0.0–0.1)
Basophils Relative: 1 %
Eosinophils Absolute: 0.4 10*3/uL (ref 0.0–0.5)
Eosinophils Relative: 6 %
HCT: 29.3 % — ABNORMAL LOW (ref 39.0–52.0)
Hemoglobin: 9.7 g/dL — ABNORMAL LOW (ref 13.0–17.0)
Immature Granulocytes: 0 %
Lymphocytes Relative: 38 %
Lymphs Abs: 2.4 10*3/uL (ref 0.7–4.0)
MCH: 28.2 pg (ref 26.0–34.0)
MCHC: 33.1 g/dL (ref 30.0–36.0)
MCV: 85.2 fL (ref 80.0–100.0)
Monocytes Absolute: 0.7 10*3/uL (ref 0.1–1.0)
Monocytes Relative: 11 %
Neutro Abs: 2.7 10*3/uL (ref 1.7–7.7)
Neutrophils Relative %: 44 %
Platelets: 262 10*3/uL (ref 150–400)
RBC: 3.44 MIL/uL — ABNORMAL LOW (ref 4.22–5.81)
RDW: 13.3 % (ref 11.5–15.5)
WBC: 6.1 10*3/uL (ref 4.0–10.5)
nRBC: 0 % (ref 0.0–0.2)

## 2022-08-28 LAB — COMPREHENSIVE METABOLIC PANEL
ALT: 11 U/L (ref 0–44)
AST: 19 U/L (ref 15–41)
Albumin: 3.5 g/dL (ref 3.5–5.0)
Alkaline Phosphatase: 87 U/L (ref 38–126)
Anion gap: 10 (ref 5–15)
BUN: 27 mg/dL — ABNORMAL HIGH (ref 8–23)
CO2: 24 mmol/L (ref 22–32)
Calcium: 8.9 mg/dL (ref 8.9–10.3)
Chloride: 100 mmol/L (ref 98–111)
Creatinine, Ser: 1.3 mg/dL — ABNORMAL HIGH (ref 0.61–1.24)
GFR, Estimated: 60 mL/min — ABNORMAL LOW (ref >60)
Glucose, Bld: 317 mg/dL — ABNORMAL HIGH (ref 70–99)
Potassium: 3.6 mmol/L (ref 3.5–5.1)
Sodium: 134 mmol/L — ABNORMAL LOW (ref 135–145)
Total Bilirubin: 0.4 mg/dL (ref 0.3–1.2)
Total Protein: 7.2 g/dL (ref 6.5–8.1)

## 2022-08-28 LAB — CBG MONITORING, ED
Glucose-Capillary: 332 mg/dL — ABNORMAL HIGH (ref 70–99)
Glucose-Capillary: 210 mg/dL — ABNORMAL HIGH (ref 70–99)

## 2022-08-28 LAB — URINALYSIS, ROUTINE W REFLEX MICROSCOPIC
Bilirubin Urine: NEGATIVE
Glucose, UA: >=500 mg/dL — AB
Ketones, ur: NEGATIVE mg/dL
Nitrite: NEGATIVE
Protein, ur: 100 mg/dL — AB
Specific Gravity, Urine: 1.013 (ref 1.005–1.030)
WBC, UA: >50 WBC/hpf (ref 0–5)
pH: 5 (ref 5.0–8.0)

## 2022-08-28 LAB — GLUCOSE, CAPILLARY
Glucose-Capillary: 305 mg/dL — ABNORMAL HIGH (ref 70–99)
Glucose-Capillary: 236 mg/dL — ABNORMAL HIGH (ref 70–99)
Glucose-Capillary: 191 mg/dL — ABNORMAL HIGH (ref 70–99)

## 2022-08-28 MED ORDER — MELATONIN 5 MG PO TABS
5.0000 mg | ORAL_TABLET | Freq: Every day | ORAL | Status: DC
  Administered 2022-08-28 – 2022-08-31 (×4): 5 mg via ORAL
  Filled 2022-08-28 (×4): qty 1

## 2022-08-28 MED ORDER — SODIUM CHLORIDE 0.9 % IV SOLN
1.0000 g | Freq: Once | INTRAVENOUS | Status: AC
  Administered 2022-08-28: 1 g via INTRAVENOUS
  Filled 2022-08-28: qty 10

## 2022-08-28 MED ORDER — ONDANSETRON HCL 4 MG PO TABS: 4.0000 mg | ORAL_TABLET | Freq: Four times a day (QID) | ORAL | Status: DC | PRN

## 2022-08-28 MED ORDER — ENOXAPARIN SODIUM 40 MG/0.4ML IJ SOSY
40.0000 mg | PREFILLED_SYRINGE | INTRAMUSCULAR | Status: DC
  Administered 2022-08-28 – 2022-09-01 (×5): 40 mg via SUBCUTANEOUS
  Filled 2022-08-28 (×5): qty 0.4

## 2022-08-28 MED ORDER — LEVOTHYROXINE SODIUM 50 MCG PO TABS
75.0000 ug | ORAL_TABLET | Freq: Every day | ORAL | Status: DC
  Administered 2022-08-28 – 2022-08-31 (×4): 75 ug via ORAL
  Filled 2022-08-28 (×4): qty 1

## 2022-08-28 MED ORDER — SODIUM CHLORIDE 0.9 % IV SOLN: INTRAVENOUS | Status: AC

## 2022-08-28 MED ORDER — SODIUM CHLORIDE 0.9 % IV SOLN
1.0000 g | INTRAVENOUS | Status: DC
  Administered 2022-08-29 – 2022-08-31 (×3): 1 g via INTRAVENOUS
  Filled 2022-08-28 (×3): qty 10

## 2022-08-28 MED ORDER — HYDROCODONE-ACETAMINOPHEN 5-325 MG PO TABS
1.0000 | ORAL_TABLET | ORAL | Status: DC | PRN
  Administered 2022-08-28 – 2022-09-01 (×11): 2 via ORAL
  Filled 2022-08-28 (×11): qty 2

## 2022-08-28 MED ORDER — ACETAMINOPHEN 325 MG PO TABS: 650.0000 mg | ORAL_TABLET | Freq: Four times a day (QID) | ORAL | Status: DC | PRN

## 2022-08-28 MED ORDER — INSULIN GLARGINE-YFGN 100 UNIT/ML ~~LOC~~ SOLN
20.0000 [IU] | Freq: Every day | SUBCUTANEOUS | Status: DC
  Administered 2022-08-28 – 2022-08-31 (×4): 20 [IU] via SUBCUTANEOUS
  Filled 2022-08-28 (×4): qty 0.2

## 2022-08-28 MED ORDER — CITALOPRAM HYDROBROMIDE 20 MG PO TABS
20.0000 mg | ORAL_TABLET | Freq: Every day | ORAL | Status: DC
  Administered 2022-08-28 – 2022-08-29 (×2): 20 mg via ORAL
  Filled 2022-08-28 (×2): qty 1

## 2022-08-28 MED ORDER — CARVEDILOL 3.125 MG PO TABS
3.1250 mg | ORAL_TABLET | Freq: Two times a day (BID) | ORAL | Status: DC
  Administered 2022-08-28 – 2022-08-31 (×8): 3.125 mg via ORAL
  Filled 2022-08-28 (×8): qty 1

## 2022-08-28 MED ORDER — CLOPIDOGREL BISULFATE 75 MG PO TABS
75.0000 mg | ORAL_TABLET | Freq: Every day | ORAL | Status: DC
  Administered 2022-08-28 – 2022-09-01 (×5): 75 mg via ORAL
  Filled 2022-08-28 (×5): qty 1

## 2022-08-28 MED ORDER — TRAZODONE HCL 50 MG PO TABS: 25.0000 mg | ORAL_TABLET | Freq: Every evening | ORAL | Status: DC | PRN

## 2022-08-28 MED ORDER — AMLODIPINE BESYLATE 10 MG PO TABS
10.0000 mg | ORAL_TABLET | Freq: Every morning | ORAL | Status: DC
  Administered 2022-08-28 – 2022-09-01 (×5): 10 mg via ORAL
  Filled 2022-08-28 (×5): qty 1

## 2022-08-28 MED ORDER — FERROUS SULFATE 325 (65 FE) MG PO TABS
325.0000 mg | ORAL_TABLET | ORAL | Status: DC
  Administered 2022-08-29 – 2022-08-31 (×2): 325 mg via ORAL
  Filled 2022-08-28 (×2): qty 1

## 2022-08-28 MED ORDER — ALBUTEROL SULFATE (2.5 MG/3ML) 0.083% IN NEBU: 2.5000 mg | INHALATION_SOLUTION | Freq: Four times a day (QID) | RESPIRATORY_TRACT | Status: DC | PRN

## 2022-08-28 MED ORDER — ASPIRIN 81 MG PO TBEC
81.0000 mg | DELAYED_RELEASE_TABLET | Freq: Every day | ORAL | Status: DC
  Administered 2022-08-28 – 2022-09-01 (×5): 81 mg via ORAL
  Filled 2022-08-28 (×5): qty 1

## 2022-08-28 MED ORDER — INSULIN ASPART 100 UNIT/ML IJ SOLN
0.0000 [IU] | Freq: Three times a day (TID) | INTRAMUSCULAR | Status: DC
  Administered 2022-08-28: 7 [IU] via SUBCUTANEOUS
  Administered 2022-08-28: 3 [IU] via SUBCUTANEOUS
  Administered 2022-08-29: 2 [IU] via SUBCUTANEOUS
  Administered 2022-08-29: 3 [IU] via SUBCUTANEOUS
  Administered 2022-08-29 – 2022-08-30 (×2): 2 [IU] via SUBCUTANEOUS
  Administered 2022-08-30 – 2022-08-31 (×3): 3 [IU] via SUBCUTANEOUS
  Administered 2022-08-31 (×2): 5 [IU] via SUBCUTANEOUS
  Administered 2022-09-01 (×2): 3 [IU] via SUBCUTANEOUS
  Filled 2022-08-28 (×9): qty 1

## 2022-08-28 MED ORDER — LACTATED RINGERS IV BOLUS
1000.0000 mL | Freq: Once | INTRAVENOUS | Status: AC
  Administered 2022-08-28: 1000 mL via INTRAVENOUS

## 2022-08-28 MED ORDER — ENSURE ENLIVE PO LIQD
237.0000 mL | Freq: Two times a day (BID) | ORAL | Status: DC
  Administered 2022-08-28 – 2022-08-31 (×6): 237 mL via ORAL

## 2022-08-28 MED ORDER — ATORVASTATIN CALCIUM 20 MG PO TABS
40.0000 mg | ORAL_TABLET | Freq: Every day | ORAL | Status: DC
  Administered 2022-08-28 – 2022-09-01 (×5): 40 mg via ORAL
  Filled 2022-08-28 (×5): qty 2

## 2022-08-28 MED ORDER — ACETAMINOPHEN 650 MG RE SUPP: 650.0000 mg | Freq: Four times a day (QID) | RECTAL | Status: DC | PRN

## 2022-08-28 MED ORDER — INSULIN ASPART 100 UNIT/ML IJ SOLN
0.0000 [IU] | Freq: Every day | INTRAMUSCULAR | Status: DC
  Administered 2022-08-28: 4 [IU] via SUBCUTANEOUS
  Administered 2022-08-29 – 2022-08-30 (×2): 3 [IU] via SUBCUTANEOUS
  Filled 2022-08-28 (×4): qty 1

## 2022-08-28 MED ORDER — ONDANSETRON HCL 4 MG/2ML IJ SOLN: 4.0000 mg | Freq: Four times a day (QID) | INTRAMUSCULAR | Status: DC | PRN

## 2022-08-28 NOTE — Plan of Care (Signed)

## 2022-08-28 NOTE — Assessment & Plan Note (Signed)
Blood sugar 317, likely from noncompliance as patient states he was unable to administer his meds A1c in May was 11.1% --Basal increased to 24 units QHS --Novolog 5 units TID WC + sliding scale 0-9 units --Close PCP follow up

## 2022-08-28 NOTE — ED Provider Notes (Signed)
[facility] Provider Note    Event Date/Time   First MD Initiated Contact with Patient 08/28/22 0038     (approximate)   History   Dysuria   HPI  Steve Alvarado is a 67 y.o. male who presents to the ED for evaluation of Dysuria   I reviewed medical DC summary from 5/9.  History of DM, CKD, cocaine abuse who was admitted for gastroenteritis, AKI.  Looks like he was discharged to a SNF, [facility]  Patient presents from home for evaluation of 2 days of dysuria.  He reports spending 21 days at [facility] and has been home for about a week now.  Reports he was doing okay until developed dysuria the past few days.  Reports feeling weak and "sick" overall.  Denies any abdominal pain or emesis.  Denies any urethral discharge when not voiding.   Physical Exam   Triage Vital Signs: ED Triage Vitals  Enc Vitals Group     BP 08/28/22 0103 (!) 168/73     Pulse Rate 08/28/22 0103 83     Resp 08/28/22 0103 16     Temp 08/28/22 0103 98.1 F (36.7 C)     Temp Source 08/28/22 0103 Oral     SpO2 08/28/22 0103 99 %     Weight 08/27/22 2358 181 lb 7 oz (82.3 kg)     Height 08/27/22 2358 6\' 2"  (1.88 m)     Head Circumference --      [facility] Flow --      Pain Score 08/27/22 2358 6     Pain Loc --      Pain Edu? --      Excl. in GC? --     Most recent vital signs: Vitals:   08/28/22 0103  BP: (!) 168/73  Pulse: 83  Resp: 16  Temp: 98.1 F (36.7 C)  SpO2: 99%    General: Awake, no distress.  CV:  Good peripheral perfusion.  Resp:  Normal effort.  Abd:  No distention.  Mild suprapubic tenderness.  Otherwise benign MSK:  No deformity noted.  Neuro:  No focal deficits appreciated. Other:     ED Results / Procedures / Treatments   Labs (all labs ordered are listed, but only abnormal results are displayed) Labs Reviewed  CBC WITH DIFFERENTIAL/PLATELET - Abnormal; Notable for the following components:      Result Value   RBC 3.44 (*)     Hemoglobin 9.7 (*)    HCT 29.3 (*)    All other components within normal limits  COMPREHENSIVE METABOLIC PANEL - Abnormal; Notable for the following components:   Sodium 134 (*)    Glucose, Bld 317 (*)    BUN 27 (*)    Creatinine, Ser 1.30 (*)    GFR, Estimated 60 (*)    All other components within normal limits  URINALYSIS, ROUTINE W REFLEX MICROSCOPIC - Abnormal; Notable for the following components:   Color, Urine YELLOW (*)    APPearance HAZY (*)    Glucose, UA >=500 (*)    Hgb urine dipstick SMALL (*)    Protein, ur 100 (*)    Leukocytes,Ua MODERATE (*)    Bacteria, UA FEW (*)    All other components within normal limits  URINE CULTURE  HIV ANTIBODY (ROUTINE TESTING W REFLEX)    EKG   RADIOLOGY   Official radiology report(s): No results found.  PROCEDURES and INTERVENTIONS:  Procedures  Medications  aspirin EC tablet  81 mg (has no administration in time range)  atorvastatin (LIPITOR) tablet 40 mg (has no administration in time range)  carvedilol (COREG) tablet 3.125 mg (has no administration in time range)  citalopram (CELEXA) tablet 20 mg (has no administration in time range)  traZODone (DESYREL) tablet 25 mg (has no administration in time range)  insulin glargine-yfgn (SEMGLEE) injection 20 Units (has no administration in time range)  levothyroxine (SYNTHROID) tablet 75 mcg (has no administration in time range)  clopidogrel (PLAVIX) tablet 75 mg (has no administration in time range)  melatonin tablet 5 mg (has no administration in time range)  ferrous sulfate tablet 325 mg (has no administration in time range)  albuterol (PROVENTIL) (2.5 MG/3ML) 0.083% nebulizer solution 2.5 mg (has no administration in time range)  enoxaparin (LOVENOX) injection 40 mg (has no administration in time range)  acetaminophen (TYLENOL) tablet 650 mg (has no administration in time range)    Or  acetaminophen (TYLENOL) suppository 650 mg (has no administration in time range)   ondansetron (ZOFRAN) tablet 4 mg (has no administration in time range)    Or  ondansetron (ZOFRAN) injection 4 mg (has no administration in time range)  insulin aspart (novoLOG) injection 0-9 Units (has no administration in time range)  insulin aspart (novoLOG) injection 0-5 Units (has no administration in time range)  0.9 %  sodium chloride infusion (has no administration in time range)  HYDROcodone-acetaminophen (NORCO/VICODIN) 5-325 MG per tablet 1-2 tablet (has no administration in time range)  amLODipine (NORVASC) tablet 10 mg (has no administration in time range)  cefTRIAXone (ROCEPHIN) 1 g in sodium chloride 0.9 % 100 mL IVPB (has no administration in time range)  lactated ringers bolus 1,000 mL (1,000 mLs Intravenous New Bag/Given 08/28/22 0216)  cefTRIAXone (ROCEPHIN) 1 g in sodium chloride 0.9 % 100 mL IVPB (1 g Intravenous New Bag/Given 08/28/22 0308)     IMPRESSION / MDM / ASSESSMENT AND PLAN / ED COURSE  I reviewed the triage vital signs and the nursing notes.  Differential diagnosis includes, but is not limited to, STI, cystitis, sepsis, urinary retention  {Patient presents with symptoms of an acute illness or injury that is potentially life-threatening.  Patient presents from home with dysuria with evidence of acute cystitis requiring observation admission.  Looks systemically well.  Benign abdomen with some mild tenderness but no peritoneal features.  No SIRS criteria to suggest sepsis.  Renal dysfunction around baseline.  Urine with infectious features and we will send for a culture.  Started on ceftriaxone.  Consult medicine for admission  Clinical Course as of 08/28/22 0419  Wynelle Link Aug 28, 2022  1610 I consult with medicine who agrees to admit [DS]    Clinical Course User Index [DS] Delton Prairie, MD     FINAL CLINICAL IMPRESSION(S) / ED DIAGNOSES   Final diagnoses:  Acute cystitis without hematuria     Rx / DC Orders   ED Discharge Orders     None         Note:  This document was prepared using Dragon voice recognition software and may include unintentional dictation errors.   Delton Prairie, MD 08/28/22 [phone]

## 2022-08-28 NOTE — Assessment & Plan Note (Signed)
Continue amlodipine, Coreg. Resume Entresto and Lasix held for AKI on admission. PCP follow up in 1-2 weeks

## 2022-08-28 NOTE — ED Notes (Signed)
Pt was given a sandwich tray and drink

## 2022-08-28 NOTE — Assessment & Plan Note (Signed)
Creatinine 1.3, up from baseline of 1 Likely prerenal from poor oral intake from inability to care for self Resolved with IV hydration. --Repeat BMP at follow up --Resume Entresto that was held

## 2022-08-28 NOTE — Assessment & Plan Note (Signed)
Discharge from SNF a week prior PT and TOC consult

## 2022-08-28 NOTE — Progress Notes (Signed)
Patient arrived to room via bed in stable condition

## 2022-08-28 NOTE — Progress Notes (Signed)
Progress Note   Patient: Steve Alvarado:[id number] DOB: 09/04/1955 DOA: 08/28/2022     0 DOS: the patient was seen and examined on 08/28/2022   Subjective:  Patient seen and examined at bedside Not so communicative this morning Does he need to that he is in the hospital Denies chest pain nausea vomiting  Brief hospital course: From HPI "Steve Alvarado is a 67 y.o. male with medical history significant for diastolic CHF, coronary artery disease, prior CVA with hemiparesis, depression, type 2 diabetes mellitus, hypertension, hypothyroidism, hospitalized a month ago from 5/7 to 07/28/2022 with acute gastroenteritis, E. coli UTI, AKI and uncontrolled diabetes, discharged to SNF, who returns the ED by EMS with concerns for neglect and inability to care for self.  He was found by EMS with soiled underwear and malodorous urine. states that he has been unable to give himself meds since his return from SNF a week ago.  He complains of generalized weakness and two days of dysuria.  He denies vomiting or diarrhea ED course and data review: BP 168/73 with otherwise unremarkable vitals. Labs notable for creatinine of 1.30, up from baseline of 1 and glucose 317.  Urinalysis with moderate leukocyte esterase and few bacteria.  Hemoglobin at baseline at 9.7. Patient treated with an LR bolus given a dose of ceftriaxone. Hospitalist consulted for admission. "  Assessment and Plan: Urinary tract infection, recurrent Patient had a recent UTI May 2024 growing pansensitive E. coli treated with ceftriaxone then Augmentin We will continue ceftriaxone Continue to follow-up on urine culture results    Acute kidney injury (HCC) Creatinine 1.3, up from baseline of 1 Likely prerenal from poor oral intake from inability to care for self Received an IV 1 L bolus in the ED Continue IV fluids Monitor renal function and avoid nephrotoxins   Uncontrolled type 2 diabetes mellitus with hyperglycemia, with long-term  current use of insulin (HCC) Blood sugar 317, likely from noncompliance as patient states he was unable to administer his meds A1c in May was 11.1 Continue short acting as well as long-acting insulin for basal control Monitor glucose closely   Chronic anemia Hemoglobin at baseline at 9.7 up from 9.2 at discharge a month prior Continue iron tabs   Coronary artery disease involving native coronary artery of native heart without angina pectoris No complaints of chest pain Continue carvedilol, atorvastatin, aspirin   Unable to care for self Discharge from SNF a week prior PT and TOC consult   Hypothyroidism Continue levothyroxine   Chronic diastolic CHF (congestive heart failure) (HCC) Euvolemic to dry Continue on Lasix, Entresto Coreg Will continue Coreg only for now given history of poor compliance with medication   Depression Continue Celexa with trazodone nightly       DVT prophylaxis: Lovenox   Consults: none   Advance Care Planning:   Code Status: Prior    Family Communication: none   Physical Exam: Constitutional:      General: He is sleeping. He is not in acute distress. HENT:     Head: Normocephalic and atraumatic.  Cardiovascular:     Rate and Rhythm: Normal rate and regular rhythm.     Heart sounds: Normal heart sounds.  Pulmonary:     Effort: Pulmonary effort is normal.     Breath sounds: Normal breath sounds.  Abdominal:     Palpations: Abdomen is soft.     Tenderness: There is no abdominal tenderness.  Neurological:     Mental Status: He is easily aroused.  Alert but poorly communicative  mental status is at baseline.    Data Reviewed: Labs reviewed by me today showed sodium 134 potassium 3.6 glucose 317 creatinine 1.3 hemoglobin 9.7  Vitals:   08/28/22 0103 08/28/22 0525 08/28/22 0737 08/28/22 0815  BP: (!) 168/73 (!) 170/78 (!) 166/79 (!) 164/78  Pulse: 83 88 (!) 59 80  Resp: 16 17  18   Temp: 98.1 F (36.7 C) 99 F (37.2 C)  98.2 F (36.8  C)  TempSrc: Oral Oral  Oral  SpO2: 99% 99%  99%  Weight:      Height:           Author: Loyce Dys, MD 08/28/2022 12:52 PM  For on call review [email or web address].

## 2022-08-28 NOTE — Assessment & Plan Note (Signed)
No complaints of chest pain Continue carvedilol, atorvastatin, aspirin

## 2022-08-28 NOTE — Evaluation (Signed)
Occupational Therapy Evaluation Patient Details Name: Steve Alvarado MRN: [id number] DOB: 1955-07-16 Today's Date: 08/28/2022   History of Present Illness 68 y.o. male with medical history significant for diastolic CHF, CAD, prior CVA with hemiparesis, depression, T2DM, HTN, hypothyroidism, hospitalized 5/7-07/28/22 with acute gastroenteritis, E. coli UTI, AKI and uncontrolled diabetes, discharged to SNF, who returns the ED by EMS with concerns for neglect and inability to care for self.  He was found by EMS with soiled underwear and malodorous urine. States that he has been unable to give himself meds since his return from SNF a week ago.  He complains of generalized weakness and two days of dysuria.   Clinical Impression   Pt was seen for OT evaluation this date. Prior to hospital admission, pt was home for approx 1 week from rehab and reports he "showed out" his cousin who was living with him because his cousin wasn't provided the care the pt needed. Pt presents to acute OT demonstrating impaired ADL performance and functional mobility 2/2 hx hemiparesis from CVA, decreased activity tolerance, balance (See OT problem list for additional functional deficits). Pt currently requires MOD A for rolling during ADL, MAX A for bed level UB and LB bathing, pericare, MOD A for grooming tasks, and set up assist for self feeding. Pt may benefit from skilled OT services to address noted impairments and functional limitations (see below for any additional details) in order to maximize safety and independence while minimizing falls risk and caregiver burden. Will require assist in order to return home.     Recommendations for follow up therapy are one component of a multi-disciplinary discharge planning process, led by the attending physician.  Recommendations may be updated based on patient status, additional functional criteria and insurance authorization.   Assistance Recommended at Discharge Frequent or  constant Supervision/Assistance  Patient can return home with the following Two people to help with walking and/or transfers;A lot of help with bathing/dressing/bathroom;Assistance with cooking/housework;Assist for transportation;Direct supervision/assist for medications management;Help with stairs or ramp for entrance    Functional Status Assessment  Patient has had a recent decline in their functional status and demonstrates the ability to make significant improvements in function in a reasonable and predictable amount of time.  Equipment Recommendations  Other (comment) (defer to next venue)    Recommendations for Other Services       Precautions / Restrictions Precautions Precautions: Fall Restrictions Weight Bearing Restrictions: No      Mobility Bed Mobility Overal bed mobility: Needs Assistance Bed Mobility: Rolling Rolling: Mod assist              Transfers                          Balance                                           ADL either performed or assessed with clinical judgement   ADL                                         General ADL Comments: Pt required MAX A for bed level UB and LB bathing, pericare, MOD A for grooming tasks, and set up assist for self feeding.  Vision         Perception     Praxis      Pertinent Vitals/Pain Pain Assessment Pain Assessment: 0-10 Pain Score: 6  Pain Location: both hands and low back Pain Descriptors / Indicators: Aching Pain Intervention(s): Limited activity within patient's tolerance, Monitored during session, Premedicated before session, Repositioned     Hand Dominance Right   Extremity/Trunk Assessment Upper Extremity Assessment Upper Extremity Assessment: LUE deficits/detail LUE Deficits / Details: hx hemiparesis LUE Coordination: decreased fine motor;decreased gross motor   Lower Extremity Assessment Lower Extremity Assessment: Defer to PT  evaluation;LLE deficits/detail LLE Deficits / Details: hx hemiparesis LLE Coordination: decreased fine motor;decreased gross motor       Communication     Cognition Arousal/Alertness: Awake/alert Behavior During Therapy: WFL for tasks assessed/performed Overall Cognitive Status: Within Functional Limits for tasks assessed                                       General Comments       Exercises     Shoulder Instructions      Home Living Family/patient expects to be discharged to:: Private residence Living Arrangements: Alone;Other (Comment) (Pt reports he just "showed out" his cousin who was not providing pt's care as needed)   Type of Home: Apartment Home Access: Level entry     Home Layout: One level                          Prior Functioning/Environment Prior Level of Function : Needs assist             Mobility Comments: from chart review, pt reported needing assist for stand/squat pivot trasnfers bed<>chair prior to last 2 admissions ADLs Comments: Previous chart review indicating he had a hospital bed and hoyer lift at home. Pt denies having these items anymore. He used disposable briefs for toileting and was requiring assist for all ADL and IADL. He was able to feed himself but required set up. Unable to manage medication containers to provide himself with medications.        OT Problem List: Decreased strength;Decreased coordination;Pain;Decreased range of motion;Impaired balance (sitting and/or standing);Decreased knowledge of use of DME or AE;Impaired UE functional use      OT Treatment/Interventions: Self-care/ADL training;Therapeutic exercise;Therapeutic activities;DME and/or AE instruction;Patient/family education;Balance training    OT Goals(Current goals can be found in the care plan section) Acute Rehab OT Goals Patient Stated Goal: go to a facility OT Goal Formulation: With patient Time For Goal Achievement:  09/11/22 Potential to Achieve Goals: Good ADL Goals Pt Will Perform Grooming: with min assist Additional ADL Goal #1: Pt will complete bed mobility with PRN MIN A to support caregiver in bed level ADL tasks, 3/3 opportunities and AE PRN.  OT Frequency: Min 1X/week    Co-evaluation              AM-PAC OT "6 Clicks" Daily Activity     Outcome Measure Help from another person eating meals?: A Little Help from another person taking care of personal grooming?: A Lot Help from another person toileting, which includes using toliet, bedpan, or urinal?: Total Help from another person bathing (including washing, rinsing, drying)?: A Lot Help from another person to put on and taking off regular upper body clothing?: A Lot Help from another person to put on and taking off regular  lower body clothing?: A Lot 6 Click Score: 12   End of Session    Activity Tolerance: Patient tolerated treatment well Patient left: in bed;with call bell/phone within reach;with bed alarm set;with nursing/sitter in room  OT Visit Diagnosis: Other abnormalities of gait and mobility (R26.89);Hemiplegia and hemiparesis Hemiplegia - Right/Left: Left Hemiplegia - dominant/non-dominant: Non-Dominant Hemiplegia - caused by: Cerebral infarction                Time: 1152-1207 OT Time Calculation (min): 15 min Charges:  OT General Charges $OT Visit: 1 Visit OT Evaluation $OT Eval Low Complexity: 1 Low  Arman Filter., MPH, MS, OTR/L ascom [phone] 08/28/22, 1:49 PM

## 2022-08-28 NOTE — Assessment & Plan Note (Signed)
Euvolemic to dry Currently on Lasix, Entresto Coreg Will continue Coreg only for now given history of poor compliance with medication

## 2022-08-28 NOTE — Assessment & Plan Note (Signed)
Treated with empiric Rocephin. Urine culture grew pan-sensitive E. Coli. Discharge on 3 more days Augmentin.

## 2022-08-28 NOTE — Assessment & Plan Note (Signed)
Continue levothyroxine 

## 2022-08-28 NOTE — Progress Notes (Signed)
PT Cancellation Note  Patient Details Name: Steve Alvarado MRN: [id number] DOB: Dec 18, 1955   Cancelled Treatment:    Reason Eval/Treat Not Completed: Patient declined, no reason specified;Pain limiting ability to participate. Pt in tears, reporting he was in too much pain (in hands and lower back) to attempt any mobility at this time. PT assisted pt with finishing his breakfast and with the tv. PT notified RN of pt's pain and request for pain meds. PT will continue to f/u with pt acutely as available and appropriate.    Alessandra Bevels Tere Mcconaughey 08/28/2022, 10:16 AM

## 2022-08-28 NOTE — H&P (Signed)
History and Physical    Patient: Steve Alvarado:[id number] DOB: 1956/01/24 DOA: 08/28/2022 DOS: the patient was seen and examined on 08/28/2022 PCP: Shelby Mattocks, MD  Patient coming from: Home  Chief Complaint:  Chief Complaint  Patient presents with   Dysuria    HPI: Steve Alvarado is a 67 y.o. male with medical history significant for diastolic CHF, coronary artery disease, prior CVA with hemiparesis, depression, type 2 diabetes mellitus, hypertension, hypothyroidism, hospitalized a month ago from 5/7 to 07/28/2022 with acute gastroenteritis, E. coli UTI, AKI and uncontrolled diabetes, discharged to SNF, who returns the ED by EMS with concerns for neglect and inability to care for self.  He was found by EMS with soiled underwear and malodorous urine. states that he has been unable to give himself meds since his return from SNF a week ago.  He complains of generalized weakness and two days of dysuria.  He denies vomiting or diarrhea ED course and data review: BP 168/73 with otherwise unremarkable vitals. Labs notable for creatinine of 1.30, up from baseline of 1 and glucose 317.  Urinalysis with moderate leukocyte esterase and few bacteria.  Hemoglobin at baseline at 9.7. Patient treated with an LR bolus given a dose of ceftriaxone. Hospitalist consulted for admission.   Review of Systems: As mentioned in the history of present illness. All other systems reviewed and are negative.  Past Medical History:  Diagnosis Date   Basal ganglia stroke (HCC)    a. 07/2021 L sided wkns/facial droop/fall-->MRI brain: nonhemorrhagic infarct of posterior R lentiform nucleus and corona radiata measure. Remote lacunar infarcts of post cerebellum bilat and L thalamus.   CHF (congestive heart failure) (HCC)    CKD (chronic kidney disease), stage II    a. 08/2021 AKI w/ creat up to 3.4.   Cocaine use    Coronary artery disease    a. 07/2010 PCI: LAD 40m, LCX 80 diff/small, OM1 53m, RCA 43m (3.0x18  Vision BMS). EF 50%; b. 07/2013 Cath: LM nl, LAD 30p, 20d, LCX 20d, LCX 100, OM1 60, RCA 30p, 82m, RPDA 90-->Med Rx; c. 10/2020 MV: EF 46% (60-65% by echo), no ischemia. 3V cor Ca2+.   Depression    Diabetes mellitus    Type II   Diastolic dysfunction    a. 10/2020 Echo: EF 60-65%, no rwma, mild LVH, GrI DD, nl RV size/fxn; b. 07/2021 Echo: EF 60-65%, no rwma, GrI DD, nl RV fxn, mildly dil LA, triv MR; c. 10/2021 Echo: EF 55-60%, no rwma, GrI DD, nl RV fxn, mild-mod MR, Asc Ao 37mm.   Hyperlipidemia    Hypertension    Hypothyroidism    Past Surgical History:  Procedure Laterality Date   CARDIAC CATHETERIZATION  08/06/2010   Bare metal stent placed in RCA.   HAND SURGERY     left   Social History:  reports that he has never smoked. He has never used smokeless tobacco. He reports current drug use. Drug: Cocaine. He reports that he does not drink alcohol.  Allergies  Allergen Reactions   Ace Inhibitors Cough    Reaction not listed on MAR    Family History  Problem Relation Age of Onset   Heart attack Mother     Prior to Admission medications   Medication Sig Start Date End Date Taking? Authorizing Provider  albuterol (VENTOLIN HFA) 108 (90 Base) MCG/ACT inhaler Inhale 2 puffs into the lungs every 6 (six) hours as needed for wheezing.    [provider]  amLODipine (NORVASC) 10 MG tablet Take 1 tablet (10 mg total) by mouth every morning. 08/19/21   de Saintclair Halsted, Cortney E, NP  aspirin EC 81 MG tablet Take 1 tablet (81 mg total) by mouth daily. Swallow whole. 08/18/21   Elmer Picker, NP  atorvastatin (LIPITOR) 40 MG tablet Take 1 tablet (40 mg total) by mouth every evening. Patient taking differently: Take 40 mg by mouth daily. 10/23/20   Delfino Lovett, MD  carvedilol (COREG) 3.125 MG tablet Take 1 tablet (3.125 mg total) by mouth 2 (two) times daily with a meal. Patient taking differently: Take 3.125 mg by mouth in the morning and at bedtime. 10/23/20   Delfino Lovett, MD  citalopram  (CELEXA) 20 MG tablet Take 1 tablet (20 mg total) by mouth daily. 10/23/20   Delfino Lovett, MD  clopidogrel (PLAVIX) 75 MG tablet Take 1 tablet (75 mg total) by mouth daily. Patient taking differently: Take 75 mg by mouth at bedtime. 08/18/21   Elmer Picker, NP  DULoxetine (CYMBALTA) 30 MG capsule Take 60 mg by mouth daily.    [provider]  ferrous sulfate 325 (65 FE) MG EC tablet Take 1 tablet (325 mg total) by mouth every other day. 11/09/21 11/09/22  Wouk, Wilfred Curtis, MD  furosemide (LASIX) 20 MG tablet Take 1 tablet (20 mg total) by mouth every other day. Monitor volume status and change dose accordingly. 07/28/22 07/28/23  Gillis Santa, MD  insulin lispro (HUMALOG) 100 UNIT/ML injection Inject 0.03 mLs (3 Units total) into the skin 3 (three) times daily with meals. 10/23/20   Delfino Lovett, MD  LANTUS SOLOSTAR 100 UNIT/ML Solostar Pen Inject 20 Units into the skin at bedtime. 09/14/21   [provider]  levothyroxine (SYNTHROID) 75 MCG tablet Take 75 mcg by mouth at bedtime. 2200 05/19/21   [provider]  lidocaine 4 % Place 1 patch onto the skin daily. Apply one patch topically to left abdomen every morning - leave on for 12 hours. 12 hours on and 12 hours off.    [provider]  melatonin 5 MG TABS Take 5 mg by mouth at bedtime.    [provider]  polyethylene glycol (MIRALAX / GLYCOLAX) 17 g packet Take 17 g by mouth daily as needed for mild constipation. 08/22/21   Lorin Glass, MD  pregabalin (LYRICA) 50 MG capsule Take 1 capsule (50 mg total) by mouth 3 (three) times daily. 07/28/22   Gillis Santa, MD  sacubitril-valsartan (ENTRESTO) 24-26 MG Take 1 tablet by mouth 2 (two) times daily. 11/29/21   Delma Freeze, FNP  traZODone (DESYREL) 50 MG tablet Take 0.5 tablets (25 mg total) by mouth at bedtime as needed for sleep. 07/28/22   Gillis Santa, MD    Physical Exam: Vitals:   08/27/22 2358 08/28/22 0103  BP:  (!) 168/73  Pulse:  83  Resp:  16  Temp:   98.1 F (36.7 C)  TempSrc:  Oral  SpO2:  99%  Weight: 82.3 kg   Height: 6\' 2"  (1.88 m)    Physical Exam Vitals and nursing note reviewed.  Constitutional:      General: He is sleeping. He is not in acute distress. HENT:     Head: Normocephalic and atraumatic.  Cardiovascular:     Rate and Rhythm: Normal rate and regular rhythm.     Heart sounds: Normal heart sounds.  Pulmonary:     Effort: Pulmonary effort is normal.     Breath sounds: Normal breath  sounds.  Abdominal:     Palpations: Abdomen is soft.     Tenderness: There is no abdominal tenderness.  Neurological:     Mental Status: He is easily aroused. Mental status is at baseline.     Labs on Admission: I have personally reviewed following labs and imaging studies  CBC: Recent Labs  Lab 08/28/22 0024  WBC 6.1  NEUTROABS 2.7  HGB 9.7*  HCT 29.3*  MCV 85.2  PLT 262   Basic Metabolic Panel: Recent Labs  Lab 08/28/22 0024  NA 134*  K 3.6  CL 100  CO2 24  GLUCOSE 317*  BUN 27*  CREATININE 1.30*  CALCIUM 8.9   GFR: Estimated Creatinine Clearance: 63.2 mL/min (A) (by C-G formula based on SCr of 1.3 mg/dL (H)). Liver Function Tests: Recent Labs  Lab 08/28/22 0024  AST 19  ALT 11  ALKPHOS 87  BILITOT 0.4  PROT 7.2  ALBUMIN 3.5   No results for input(s): "LIPASE", "AMYLASE" in the last 168 hours. No results for input(s): "AMMONIA" in the last 168 hours. Coagulation Profile: No results for input(s): "INR", "PROTIME" in the last 168 hours. Cardiac Enzymes: No results for input(s): "CKTOTAL", "CKMB", "CKMBINDEX", "TROPONINI" in the last 168 hours. BNP (last 3 results) No results for input(s): "PROBNP" in the last 8760 hours. HbA1C: No results for input(s): "HGBA1C" in the last 72 hours. CBG: No results for input(s): "GLUCAP" in the last 168 hours. Lipid Profile: No results for input(s): "CHOL", "HDL", "LDLCALC", "TRIG", "CHOLHDL", "LDLDIRECT" in the last 72 hours. Thyroid Function Tests: No  results for input(s): "TSH", "T4TOTAL", "FREET4", "T3FREE", "THYROIDAB" in the last 72 hours. Anemia Panel: No results for input(s): "VITAMINB12", "FOLATE", "FERRITIN", "TIBC", "IRON", "RETICCTPCT" in the last 72 hours. Urine analysis:    Component Value Date/Time   COLORURINE YELLOW (A) 08/28/2022 0233   APPEARANCEUR HAZY (A) 08/28/2022 0233   APPEARANCEUR Clear 08/22/2013 0057   LABSPEC 1.013 08/28/2022 0233   LABSPEC 1.017 08/22/2013 0057   PHURINE 5.0 08/28/2022 0233   GLUCOSEU >=500 (A) 08/28/2022 0233   GLUCOSEU >=500 08/22/2013 0057   HGBUR SMALL (A) 08/28/2022 0233   BILIRUBINUR NEGATIVE 08/28/2022 0233   BILIRUBINUR Negative 08/22/2013 0057   KETONESUR NEGATIVE 08/28/2022 0233   PROTEINUR 100 (A) 08/28/2022 0233   NITRITE NEGATIVE 08/28/2022 0233   LEUKOCYTESUR MODERATE (A) 08/28/2022 0233   LEUKOCYTESUR Negative 08/22/2013 0057    Radiological Exams on Admission: No results found.   Data Reviewed: Relevant notes from primary care and specialist visits, past discharge summaries as available in EHR, including Care Everywhere. Prior diagnostic testing as pertinent to current admission diagnoses Updated medications and problem lists for reconciliation ED course, including vitals, labs, imaging, treatment and response to treatment Triage notes, nursing and pharmacy notes and ED provider's notes Notable results as noted in HPI   Assessment and Plan: * Urinary tract infection, recurrent Patient had a recent UTI May 2024 growing pansensitive E. coli treated with ceftriaxone then Augmentin Continue ceftriaxone Follow cultures  Acute kidney injury (HCC) Creatinine 1.3, up from baseline of 1 Likely prerenal from poor oral intake from inability to care for self Received an IV 1 L bolus in the ED Continue IV fluids Monitor renal function and avoid nephrotoxins  Uncontrolled type 2 diabetes mellitus with hyperglycemia, with long-term current use of insulin (HCC) Blood  sugar 317, likely from noncompliance as patient states he was unable to administer his meds A1c in May was 11.1 Patient currently on basal insulin as  well as Premeal and sliding scale Might benefit from a simpler regimen given inability to care for self Continue basal insulin with sliding scale coverage for now  Chronic anemia Hemoglobin at baseline at 9.7 up from 9.2 at discharge a month prior Continue iron tabs  Coronary artery disease involving native coronary artery of native heart without angina pectoris No complaints of chest pain Continue carvedilol, atorvastatin, aspirin  Unable to care for self Discharge from SNF a week prior PT and TOC consult  Hypothyroidism Continue levothyroxine  Chronic diastolic CHF (congestive heart failure) (HCC) Euvolemic to dry Currently on Lasix, Entresto Coreg Will continue Coreg only for now given history of poor compliance with medication  Depression Continue Celexa with trazodone nightly        DVT prophylaxis: Lovenox  Consults: none  Advance Care Planning:   Code Status: Prior   Family Communication: none  Disposition Plan: Back to previous home environment  Severity of Illness: The appropriate patient status for this patient is OBSERVATION. Observation status is judged to be reasonable and necessary in order to provide the required intensity of service to ensure the patient's safety. The patient's presenting symptoms, physical exam findings, and initial radiographic and laboratory data in the context of their medical condition is felt to place them at decreased risk for further clinical deterioration. Furthermore, it is anticipated that the patient will be medically stable for discharge from the hospital within 2 midnights of admission.   Author: Andris Baumann, MD 08/28/2022 3:26 AM  For on call review [email or web address].

## 2022-08-28 NOTE — Assessment & Plan Note (Addendum)
Pt reportedly no longer takes Celexa. Continue trazodone PRN nightly

## 2022-08-28 NOTE — ED Notes (Addendum)
Patient reports his care giver left and he has been attempting to take care of himself.  Also reports that he has not had his medications in over a week.

## 2022-08-28 NOTE — ED Notes (Signed)
Advised nurse that patient has ready bed 

## 2022-08-28 NOTE — Assessment & Plan Note (Addendum)
Hemoglobin at baseline at 9.7 up from 9.2 at discharge a month prior Continue iron tabs

## 2022-08-28 NOTE — ED Notes (Signed)
Patient requesting to be turned.  While turning patient, noticed he had been incontinent of urine.  Patient cleansed, new gown and linen in place.  Patient placed on right side for comfort.

## 2022-08-29 DIAGNOSIS — Z79890 Hormone replacement therapy (postmenopausal): Secondary | ICD-10-CM | POA: Diagnosis not present

## 2022-08-29 DIAGNOSIS — N390 Urinary tract infection, site not specified: Secondary | ICD-10-CM | POA: Diagnosis present

## 2022-08-29 DIAGNOSIS — E440 Moderate protein-calorie malnutrition: Secondary | ICD-10-CM | POA: Diagnosis present

## 2022-08-29 DIAGNOSIS — N3000 Acute cystitis without hematuria: Secondary | ICD-10-CM | POA: Diagnosis present

## 2022-08-29 DIAGNOSIS — Z8249 Family history of ischemic heart disease and other diseases of the circulatory system: Secondary | ICD-10-CM | POA: Diagnosis not present

## 2022-08-29 DIAGNOSIS — Z794 Long term (current) use of insulin: Secondary | ICD-10-CM | POA: Diagnosis not present

## 2022-08-29 DIAGNOSIS — Z79899 Other long term (current) drug therapy: Secondary | ICD-10-CM | POA: Diagnosis not present

## 2022-08-29 DIAGNOSIS — E039 Hypothyroidism, unspecified: Secondary | ICD-10-CM | POA: Diagnosis present

## 2022-08-29 DIAGNOSIS — Z7902 Long term (current) use of antithrombotics/antiplatelets: Secondary | ICD-10-CM | POA: Diagnosis not present

## 2022-08-29 DIAGNOSIS — G4700 Insomnia, unspecified: Secondary | ICD-10-CM | POA: Diagnosis present

## 2022-08-29 DIAGNOSIS — I5032 Chronic diastolic (congestive) heart failure: Secondary | ICD-10-CM | POA: Diagnosis present

## 2022-08-29 DIAGNOSIS — E785 Hyperlipidemia, unspecified: Secondary | ICD-10-CM | POA: Diagnosis present

## 2022-08-29 DIAGNOSIS — F32A Depression, unspecified: Secondary | ICD-10-CM | POA: Diagnosis present

## 2022-08-29 DIAGNOSIS — B9620 Unspecified Escherichia coli [E. coli] as the cause of diseases classified elsewhere: Secondary | ICD-10-CM | POA: Diagnosis present

## 2022-08-29 DIAGNOSIS — I2510 Atherosclerotic heart disease of native coronary artery without angina pectoris: Secondary | ICD-10-CM | POA: Diagnosis present

## 2022-08-29 DIAGNOSIS — I10 Essential (primary) hypertension: Secondary | ICD-10-CM | POA: Diagnosis not present

## 2022-08-29 DIAGNOSIS — E1165 Type 2 diabetes mellitus with hyperglycemia: Secondary | ICD-10-CM | POA: Diagnosis present

## 2022-08-29 DIAGNOSIS — I130 Hypertensive heart and chronic kidney disease with heart failure and stage 1 through stage 4 chronic kidney disease, or unspecified chronic kidney disease: Secondary | ICD-10-CM | POA: Diagnosis present

## 2022-08-29 DIAGNOSIS — Z91148 Patient's other noncompliance with medication regimen for other reason: Secondary | ICD-10-CM | POA: Diagnosis not present

## 2022-08-29 DIAGNOSIS — N182 Chronic kidney disease, stage 2 (mild): Secondary | ICD-10-CM | POA: Diagnosis present

## 2022-08-29 DIAGNOSIS — E1122 Type 2 diabetes mellitus with diabetic chronic kidney disease: Secondary | ICD-10-CM | POA: Diagnosis present

## 2022-08-29 DIAGNOSIS — Z6823 Body mass index (BMI) 23.0-23.9, adult: Secondary | ICD-10-CM | POA: Diagnosis not present

## 2022-08-29 DIAGNOSIS — Z7982 Long term (current) use of aspirin: Secondary | ICD-10-CM | POA: Diagnosis not present

## 2022-08-29 DIAGNOSIS — D649 Anemia, unspecified: Secondary | ICD-10-CM | POA: Diagnosis present

## 2022-08-29 DIAGNOSIS — N179 Acute kidney failure, unspecified: Secondary | ICD-10-CM | POA: Diagnosis present

## 2022-08-29 LAB — HIV ANTIBODY (ROUTINE TESTING W REFLEX): HIV Screen 4th Generation wRfx: NONREACTIVE

## 2022-08-29 LAB — CBC WITH DIFFERENTIAL/PLATELET
Abs Immature Granulocytes: 0.01 10*3/uL (ref 0.00–0.07)
Basophils Absolute: 0 10*3/uL (ref 0.0–0.1)
Basophils Relative: 1 %
Eosinophils Absolute: 0.3 10*3/uL (ref 0.0–0.5)
Eosinophils Relative: 7 %
HCT: 29.7 % — ABNORMAL LOW (ref 39.0–52.0)
Hemoglobin: 9.8 g/dL — ABNORMAL LOW (ref 13.0–17.0)
Immature Granulocytes: 0 %
Lymphocytes Relative: 46 %
Lymphs Abs: 2.2 10*3/uL (ref 0.7–4.0)
MCH: 28.2 pg (ref 26.0–34.0)
MCHC: 33 g/dL (ref 30.0–36.0)
MCV: 85.6 fL (ref 80.0–100.0)
Monocytes Absolute: 0.5 10*3/uL (ref 0.1–1.0)
Monocytes Relative: 10 %
Neutro Abs: 1.7 10*3/uL (ref 1.7–7.7)
Neutrophils Relative %: 36 %
Platelets: 253 10*3/uL (ref 150–400)
RBC: 3.47 MIL/uL — ABNORMAL LOW (ref 4.22–5.81)
RDW: 13.3 % (ref 11.5–15.5)
WBC: 4.8 10*3/uL (ref 4.0–10.5)
nRBC: 0 % (ref 0.0–0.2)

## 2022-08-29 LAB — GLUCOSE, CAPILLARY
Glucose-Capillary: 203 mg/dL — ABNORMAL HIGH (ref 70–99)
Glucose-Capillary: 171 mg/dL — ABNORMAL HIGH (ref 70–99)
Glucose-Capillary: 210 mg/dL — ABNORMAL HIGH (ref 70–99)
Glucose-Capillary: 167 mg/dL — ABNORMAL HIGH (ref 70–99)
Glucose-Capillary: 262 mg/dL — ABNORMAL HIGH (ref 70–99)

## 2022-08-29 LAB — BASIC METABOLIC PANEL
Anion gap: 7 (ref 5–15)
BUN: 25 mg/dL — ABNORMAL HIGH (ref 8–23)
CO2: 25 mmol/L (ref 22–32)
Calcium: 8.6 mg/dL — ABNORMAL LOW (ref 8.9–10.3)
Chloride: 103 mmol/L (ref 98–111)
Creatinine, Ser: 1.13 mg/dL (ref 0.61–1.24)
GFR, Estimated: >60 mL/min (ref >60)
Glucose, Bld: 196 mg/dL — ABNORMAL HIGH (ref 70–99)
Potassium: 4.2 mmol/L (ref 3.5–5.1)
Sodium: 135 mmol/L (ref 135–145)

## 2022-08-29 LAB — URINE CULTURE

## 2022-08-29 MED ORDER — DULOXETINE HCL 30 MG PO CPEP
60.0000 mg | ORAL_CAPSULE | Freq: Every day | ORAL | Status: DC
  Administered 2022-08-30 – 2022-09-01 (×3): 60 mg via ORAL
  Filled 2022-08-29 (×3): qty 2

## 2022-08-29 NOTE — Evaluation (Signed)
Physical Therapy Evaluation Patient Details Name: Steve Alvarado MRN: [id number] DOB: 02-Jan-1956 Today's Date: 08/29/2022  History of Present Illness  67 y.o. male with medical history significant for diastolic CHF, CAD, prior CVA with hemiparesis, depression, T2DM, HTN, hypothyroidism, hospitalized 5/7-07/28/22 with acute gastroenteritis, E. coli UTI, AKI and uncontrolled diabetes, discharged to SNF, who returns the ED by EMS with concerns for neglect and inability to care for self.  He was found by EMS with soiled underwear and malodorous urine. States that he has been unable to give himself meds since his return from SNF a week ago.  He complains of generalized weakness and two days of dysuria.  Clinical Impression  Pt is a pleasant 67 year old male who was admitted for UTI and acute cystitis without hematuria. Pt sitting up in bed upon arrival and was agreeable to PT. R UE impairments with coordination, mobility, ROM, strength, and transfers Patient did not try and use RUE when cued for bed mobility. Unable to grasp SPT's hand for grip strength test. Rolled side to side in bed with min assist at hip and verbal cueing for use of RUE.Refused sitting EOB or transferring to chair. Patient stated they were using disposable briefs at home but would like to request a BCS/3in1 upon discharge. No current assistance from family/friends at home. Patient left sitting up in bed with call bell and bed alarm set. Pt demonstrates deficits with _. Pt will continue to receive skilled PT services while admitted and will defer to TOC/care team for updates regarding disposition planning.        Recommendations for follow up therapy are one component of a multi-disciplinary discharge planning process, led by the attending physician.  Recommendations may be updated based on patient status, additional functional criteria and insurance authorization.  Follow Up Recommendations Can patient physically be transported by  private vehicle: No     Assistance Recommended at Discharge Frequent or constant Supervision/Assistance  Patient can return home with the following  A lot of help with walking and/or transfers;A lot of help with bathing/dressing/bathroom;Assistance with cooking/housework;Assistance with feeding;Assist for transportation;Help with stairs or ramp for entrance    Equipment Recommendations BSC/3in1;Rolling walker (2 wheels)  Recommendations for Other Services       Functional Status Assessment Patient has had a recent decline in their functional status and demonstrates the ability to make significant improvements in function in a reasonable and predictable amount of time.     Precautions / Restrictions Precautions Precautions: Fall Restrictions Weight Bearing Restrictions: No      Mobility  Bed Mobility Overal bed mobility: Needs Assistance Bed Mobility: Rolling Rolling: Min assist         General bed mobility comments: Patient able to perform bed mobility with min assist. assistance needed for hip control when rolling. verbal cueing for utilization of R UE    Transfers                   General transfer comment: not assessed due to patient refusal    Ambulation/Gait               General Gait Details: unable to assess.  Stairs            Wheelchair Mobility    Modified Rankin (Stroke Patients Only)       Balance Overall balance assessment: Mild deficits observed, not formally tested  Pertinent Vitals/Pain Pain Assessment Pain Assessment: 0-10 Pain Score: 3  Pain Location: both hands and low back Pain Descriptors / Indicators: Aching Pain Intervention(s): Limited activity within patient's tolerance, Monitored during session    Home Living Family/patient expects to be discharged to:: Private residence Living Arrangements: Alone;Other (Comment) Available Help at Discharge:  Other (Comment) (none) Type of Home: Apartment Home Access: Level entry       Home Layout: One level Home Equipment: None Additional Comments: Pt was living in private residence with his cousin prior to admission. Apartment on 1st floor with level entry.    Prior Function Prior Level of Function : Needs assist       Physical Assist : Mobility (physical);ADLs (physical) Mobility (physical): Bed mobility;Gait;Transfers;Stairs ADLs (physical): Grooming;Bathing;Dressing;Toileting;IADLs Mobility Comments: mod I for bed mobility. Patient stated they would like to use a bsc at home but they don't have one. says they would need assistance to transfer to bsc. ADLs Comments: Patient used disposable briefs for toileting after dismissing family member who was assisting him. States he required assistance for all ADL and IADL.     Hand Dominance   Dominant Hand: Right    Extremity/Trunk Assessment   Upper Extremity Assessment Upper Extremity Assessment: RUE deficits/detail RUE Deficits / Details: decreased strength and ROM in RUE. unable to grasp SPT's hand for strength check. Did not respond to verbal cues for engaging RUE. RUE Coordination: decreased gross motor    Lower Extremity Assessment Lower Extremity Assessment: RLE deficits/detail RLE Deficits / Details: Decreased strength and ROM. able to move against gravity but tolerates no resistance. RLE Coordination: decreased gross motor       Communication   Communication: No difficulties  Cognition Arousal/Alertness: Awake/alert Behavior During Therapy: WFL for tasks assessed/performed Overall Cognitive Status: Within Functional Limits for tasks assessed                                          General Comments      Exercises     Assessment/Plan    PT Assessment Patient needs continued PT services  PT Problem List Decreased strength;Decreased range of motion;Decreased activity tolerance;Decreased  balance;Decreased mobility;Decreased coordination;Decreased knowledge of use of DME;Pain       PT Treatment Interventions      PT Goals (Current goals can be found in the Care Plan section)  Acute Rehab PT Goals Patient Stated Goal: to have assistance where his discharged. PT Goal Formulation: With patient Time For Goal Achievement: 09/12/22 Potential to Achieve Goals: Fair    Frequency       Co-evaluation               AM-PAC PT "6 Clicks" Mobility  Outcome Measure Help needed turning from your back to your side while in a flat bed without using bedrails?: A Lot Help needed moving from lying on your back to sitting on the side of a flat bed without using bedrails?: A Lot Help needed moving to and from a bed to a chair (including a wheelchair)?: A Lot Help needed standing up from a chair using your arms (e.g., wheelchair or bedside chair)?: A Lot Help needed to walk in hospital room?: Total Help needed climbing 3-5 steps with a railing? : Total 6 Click Score: 10    End of Session   Activity Tolerance: Patient tolerated treatment well Patient left: in bed;with call bell/phone within  reach;with bed alarm set Nurse Communication: Mobility status PT Visit Diagnosis: Muscle weakness (generalized) (M62.81);Pain;Unsteadiness on feet (R26.81)    Time: 0950-1009 PT Time Calculation (min) (ACUTE ONLY): 19 min   Charges:              Malachi Carl, SPT   Malachi Carl 08/29/2022, 12:35 PM

## 2022-08-29 NOTE — Progress Notes (Signed)
Progress Note   Patient: Steve Alvarado FAO:[id number] DOB: 1955/06/03 DOA: 08/28/2022     0 DOS: the patient was seen and examined on 08/29/2022     Subjective:  Patient appears more alert and communicative today Denies worsening shortness of breath dysuria cough or abdominal pain  Brief hospital course: From HPI "SHAFER SWAMY is a 67 y.o. male with medical history significant for diastolic CHF, coronary artery disease, prior CVA with hemiparesis, depression, type 2 diabetes mellitus, hypertension, hypothyroidism, hospitalized a month ago from 5/7 to 07/28/2022 with acute gastroenteritis, E. coli UTI, AKI and uncontrolled diabetes, discharged to SNF, who returns the ED by EMS with concerns for neglect and inability to care for self.  He was found by EMS with soiled underwear and malodorous urine. states that he has been unable to give himself meds since his return from SNF a week ago.  He complains of generalized weakness and two days of dysuria.  He denies vomiting or diarrhea ED course and data review: BP 168/73 with otherwise unremarkable vitals. Labs notable for creatinine of 1.30, up from baseline of 1 and glucose 317.  Urinalysis with moderate leukocyte esterase and few bacteria.  Hemoglobin at baseline at 9.7. Patient treated with an LR bolus given a dose of ceftriaxone. Hospitalist consulted for admission. "   Assessment and Plan: E. coli urinary tract infection, recurrent Patient had a recent UTI May 2024 growing pansensitive E. coli treated with ceftriaxone then Augmentin We will continue ceftriaxone Urine culture results reviewed by me today showing greater than 100,000 colony-forming units growing E. coli with sensitivities pending     Acute kidney injury (HCC)-improved Creatinine 1.3, up from baseline of 1 Likely prerenal from poor oral intake from inability to care for self Received an IV 1 L bolus in the ED Continue supplemental IV fluid for now Monitor renal  function and avoid nephrotoxins   Uncontrolled type 2 diabetes mellitus with hyperglycemia, with long-term current use of insulin (HCC) Blood sugar 317, likely from noncompliance as patient states he was unable to administer his meds A1c in May was 11.1 Continue short acting as well as long-acting insulin for basal control Continue to monitor glucose closely   Chronic anemia Hemoglobin at baseline at 9.7 up from 9.2 at discharge a month prior Continue iron tabs   Coronary artery disease involving native coronary artery of native heart without angina pectoris No complaints of chest pain Continue carvedilol, atorvastatin, aspirin   Unable to care for self Discharge from SNF a week prior PT and TOC consult   Hypothyroidism Continue levothyroxine   Chronic diastolic CHF (congestive heart failure) (HCC) Euvolemic to dry Continue on Lasix, Entresto Coreg  Depression Continue Celexa with trazodone nightly       DVT prophylaxis: Lovenox   Consults: none   Advance Care Planning:   Code Status: Prior    Family Communication: none     Physical Exam: Constitutional:      General: He is sleeping. He is not in acute distress. HENT:     Head: Normocephalic and atraumatic.  Cardiovascular:     Rate and Rhythm: Normal rate and regular rhythm.     Heart sounds: Normal heart sounds.  Pulmonary:     Effort: Pulmonary effort is normal.     Breath sounds: Normal breath sounds.  Abdominal:     Palpations: Abdomen is soft.     Tenderness: There is no abdominal tenderness.  Neurological:     Mental Status: He appears  a little more awake today than previous day   Data Reviewed: Labs showed sodium 135 potassium 4.2 creatinine 1.1 WBC 4.8 hemoglobin 9.8 Urine culture results reviewed by me today showing greater than 100,000 colony-forming units growing E. coli with sensitivities pending     Vitals:   08/28/22 1628 08/28/22 2038 08/29/22 0528 08/29/22 0745  BP: 124/85 (!) 141/73  (!) 160/81 (!) 146/76  Pulse: 62 67 65 68  Resp: 16 20 18 14   Temp: 97.9 F (36.6 C) 98.4 F (36.9 C) 98.3 F (36.8 C) 97.9 F (36.6 C)  TempSrc: Oral     SpO2: 100% 100% 100% 99%  Weight:      Height:         Author: Loyce Dys, MD 08/29/2022 1:13 PM  For on call review [email or web address].

## 2022-08-29 NOTE — Inpatient Diabetes Management (Signed)
Inpatient Diabetes Program Recommendations  AACE/ADA: New Consensus Statement on Inpatient Glycemic Control   Target Ranges:  Prepandial:   less than 140 mg/dL      [facility] postprandial:   less than 180 mg/dL (1-2 hours)      Critically ill patients:  140 - 180 mg/dL    Latest Reference Range & Units 08/28/22 07:38 08/28/22 11:32 08/28/22 16:06 08/28/22 20:29 08/29/22 05:25 08/29/22 07:46  Glucose-Capillary 70 - 99 mg/dL 829 (H) 562 (H) 130 (H) 191 (H) 203 (H) 171 (H)    Review of Glycemic Control  Diabetes history: DM2 Outpatient Diabetes medications: Lantus 20 units QHS, Humalog 3 TID with meals Current orders for Inpatient glycemic control: Semglee 20 units QHS, Novolog 0-9 units TID with meals, Novolog 0-5 units QHS  Inpatient Diabetes Program Recommendations:    Insulin: Please consider ordering Novolog 3 units TID with meals for meal coverage if patient eats at least 50% of meals.  NOTE: Patient admitted with recurent UTI, AKI, uncontrolled DM2, and per H&P patient is unable to care for self. Was inpatient 07/26/22-07/28/22 and discharged to SNF. Per H&P, patient left SNF 1 week ago and has been unable to care for himself or take medications.   Thanks, Orlando Penner, RN, MSN, CDCES Diabetes Coordinator Inpatient Diabetes Program [phone] (Team Pager from 8am to 5pm)

## 2022-08-30 DIAGNOSIS — N390 Urinary tract infection, site not specified: Secondary | ICD-10-CM | POA: Diagnosis not present

## 2022-08-30 DIAGNOSIS — B9620 Unspecified Escherichia coli [E. coli] as the cause of diseases classified elsewhere: Secondary | ICD-10-CM | POA: Diagnosis not present

## 2022-08-30 LAB — BASIC METABOLIC PANEL
Anion gap: 6 (ref 5–15)
BUN: 30 mg/dL — ABNORMAL HIGH (ref 8–23)
CO2: 25 mmol/L (ref 22–32)
Calcium: 8 mg/dL — ABNORMAL LOW (ref 8.9–10.3)
Chloride: 107 mmol/L (ref 98–111)
Creatinine, Ser: 1.11 mg/dL (ref 0.61–1.24)
GFR, Estimated: >60 mL/min (ref >60)
Glucose, Bld: 279 mg/dL — ABNORMAL HIGH (ref 70–99)
Potassium: 4.2 mmol/L (ref 3.5–5.1)
Sodium: 138 mmol/L (ref 135–145)

## 2022-08-30 LAB — CBC WITH DIFFERENTIAL/PLATELET
Abs Immature Granulocytes: 0.02 10*3/uL (ref 0.00–0.07)
Basophils Absolute: 0 10*3/uL (ref 0.0–0.1)
Basophils Relative: 0 %
Eosinophils Absolute: 0.4 10*3/uL (ref 0.0–0.5)
Eosinophils Relative: 8 %
HCT: 28.7 % — ABNORMAL LOW (ref 39.0–52.0)
Hemoglobin: 9.7 g/dL — ABNORMAL LOW (ref 13.0–17.0)
Immature Granulocytes: 0 %
Lymphocytes Relative: 42 %
Lymphs Abs: 1.9 10*3/uL (ref 0.7–4.0)
MCH: 29.2 pg (ref 26.0–34.0)
MCHC: 33.8 g/dL (ref 30.0–36.0)
MCV: 86.4 fL (ref 80.0–100.0)
Monocytes Absolute: 0.3 10*3/uL (ref 0.1–1.0)
Monocytes Relative: 7 %
Neutro Abs: 2 10*3/uL (ref 1.7–7.7)
Neutrophils Relative %: 43 %
Platelets: 265 10*3/uL (ref 150–400)
RBC: 3.32 MIL/uL — ABNORMAL LOW (ref 4.22–5.81)
RDW: 13.3 % (ref 11.5–15.5)
WBC: 4.6 10*3/uL (ref 4.0–10.5)
nRBC: 0 % (ref 0.0–0.2)

## 2022-08-30 LAB — GLUCOSE, CAPILLARY
Glucose-Capillary: 222 mg/dL — ABNORMAL HIGH (ref 70–99)
Glucose-Capillary: 180 mg/dL — ABNORMAL HIGH (ref 70–99)
Glucose-Capillary: 207 mg/dL — ABNORMAL HIGH (ref 70–99)
Glucose-Capillary: 296 mg/dL — ABNORMAL HIGH (ref 70–99)

## 2022-08-30 LAB — URINE CULTURE: Culture: >=100000 — AB

## 2022-08-30 NOTE — Progress Notes (Signed)
Progress Note   Patient: Steve Alvarado:[id number] DOB: June 11, 1955 DOA: 08/28/2022     1 DOS: the patient was seen and examined on 08/30/2022    Subjective:  More awake today Denies worsening abdominal pain chest pain nausea vomiting Transition of care working on placement I have reviewed patient's urine culture results showing E. coli that is pansensitive   Brief hospital course: From HPI "Steve Alvarado is a 67 y.o. male with medical history significant for diastolic CHF, coronary artery disease, prior CVA with hemiparesis, depression, type 2 diabetes mellitus, hypertension, hypothyroidism, hospitalized a month ago from 5/7 to 07/28/2022 with acute gastroenteritis, E. coli UTI, AKI and uncontrolled diabetes, discharged to SNF, who returns the ED by EMS with concerns for neglect and inability to care for self.  He was found by EMS with soiled underwear and malodorous urine. states that he has been unable to give himself meds since his return from SNF a week ago.  He complains of generalized weakness and two days of dysuria.  He denies vomiting or diarrhea ED course and data review: BP 168/73 with otherwise unremarkable vitals. Labs notable for creatinine of 1.30, up from baseline of 1 and glucose 317.  Urinalysis with moderate leukocyte esterase and few bacteria.  Hemoglobin at baseline at 9.7. Patient treated with an LR bolus given a dose of ceftriaxone. Hospitalist consulted for admission. "   Assessment and Plan: E. coli urinary tract infection, recurrent Patient had a recent UTI May 2024 growing pansensitive E. coli treated with ceftriaxone then Augmentin Will continue with ceftriaxone I have reviewed patient's urine culture results showing E. coli that is pansensitive     Acute kidney injury (HCC)-improved Creatinine 1.3, up from baseline of 1 Likely prerenal from poor oral intake from inability to care for self Received an IV 1 L bolus in the ED IV fluid discontinued today  given improvement in renal function Monitor renal function and avoid nephrotoxins   Uncontrolled type 2 diabetes mellitus with hyperglycemia, with long-term current use of insulin (HCC) Blood sugar 317, likely from noncompliance as patient states he was unable to administer his meds A1c in May was 11.1 Continue short acting as well as long-acting insulin for basal control Continue to monitor glucose closely   Chronic anemia Hemoglobin at baseline at 9.7 up from 9.2 at discharge a month prior Continue iron tabs   Coronary artery disease involving native coronary artery of native heart without angina pectoris No complaints of chest pain Continue carvedilol, atorvastatin, aspirin   Unable to care for self Discharge from SNF a week prior PT and [facility] consult Physical therapist has recommended discharge to skilled nursing facility Transition of care working on bed availability   Hypothyroidism Continue levothyroxine   Chronic diastolic CHF (congestive heart failure) (HCC) Euvolemic to dry Continue on Lasix, Entresto Coreg   Depression Continue Celexa with trazodone nightly       DVT prophylaxis: Lovenox   Consults: none   Advance Care Planning:   Code Status: Prior    Family Communication: none     Physical Exam: Constitutional:      General: He is sleeping. He is not in acute distress. HENT:     Head: Normocephalic and atraumatic.  Cardiovascular:     Rate and Rhythm: Normal rate and regular rhythm.     Heart sounds: Normal heart sounds.  Pulmonary:     Effort: Pulmonary effort is normal.     Breath sounds: Normal breath sounds.  Abdominal:  Palpations: Abdomen is soft.     Tenderness: There is no abdominal tenderness.  Neurological:     Mental Status: He appears a little more awake today than previous day   Data Reviewed: Lab results reviewed by me today sodium 138 creatinine 1.1 WBC 4.6  I have reviewed patient's urine culture results showing E. coli that  is pansensitive        Vitals:   08/29/22 1617 08/29/22 2000 08/30/22 0525 08/30/22 1614  BP: (!) 142/71 136/61 (!) 131/58 136/66  Pulse: 66 70 64 74  Resp: 16 20 20 16   Temp: 97.9 F (36.6 C) 98.6 F (37 C) 97.9 F (36.6 C) 98.6 F (37 C)  TempSrc:   Oral Oral  SpO2: 100% 98% 99% 99%  Weight:      Height:         Author: Loyce Dys, MD 08/30/2022 5:32 PM  For on call review [email or web address].

## 2022-08-30 NOTE — TOC Progression Note (Signed)
Transition of Care [facility]) - Progression Note    Patient Details  Name: Steve Alvarado MRN: [id number] Date of Birth: June 03, 1955  [facility] of [facility]) CM/SW Contact  Allena Katz, LCSW Phone Number: 08/30/2022, 2:26 PM  Clinical Narrative:   CSW spoke with patient regarding SNF. Pt reports he was just discharged from [facility] about two weeks ago and is interested now in going to facilities in the [place] area. CSW started SNF workup to [facility].          Expected Discharge Plan and Services                                               Social Determinants of Health (SDOH) Interventions SDOH Screenings   Food Insecurity: No Food Insecurity (08/28/2022)  Housing: Low Risk  (08/28/2022)  Transportation Needs: No Transportation Needs (08/28/2022)  Utilities: Not At Risk (08/28/2022)  Depression (PHQ2-9): Low Risk  (12/31/2021)  Tobacco Use: Low Risk  (08/27/2022)    Readmission Risk Interventions    11/08/2021   11:09 AM  Readmission Risk Prevention Plan  Transportation Screening Complete  Medication Review (RN Care Manager) Complete  PCP or Specialist appointment within 3-5 days of discharge Complete  SW Recovery Care/Counseling Consult Complete  Palliative Care Screening Not Applicable  Skilled Nursing Facility Complete

## 2022-08-30 NOTE — Inpatient Diabetes Management (Signed)
Inpatient Diabetes Program Recommendations  AACE/ADA: New Consensus Statement on Inpatient Glycemic Control   Target Ranges:  Prepandial:   less than 140 mg/dL      [facility] postprandial:   less than 180 mg/dL (1-2 hours)      Critically ill patients:  140 - 180 mg/dL    Latest Reference Range & Units 08/29/22 07:46 08/29/22 11:19 08/29/22 16:18 08/29/22 19:57 08/30/22 08:05  Glucose-Capillary 70 - 99 mg/dL 161 (H) 096 (H) 045 (H) 262 (H) 222 (H)   Review of Glycemic Control  Diabetes history: DM2 Outpatient Diabetes medications: Lantus 20 units QHS, Humalog 3 TID with meals Current orders for Inpatient glycemic control: Semglee 20 units QHS, Novolog 0-9 units TID with meals, Novolog 0-5 units QHS   Inpatient Diabetes Program Recommendations:     Insulin: Please consider ordering Novolog 3 units TID with meals for meal coverage if patient eats at least 50% of meals.   NOTE: Patient admitted with recurent UTI, AKI, uncontrolled DM2, and per H&P patient is unable to care for self. Was inpatient 07/26/22-07/28/22 and discharged to SNF. Per H&P, patient left SNF 1 week ago and has been unable to care for himself or take medications.  Thanks, Orlando Penner, RN, MSN, CDCES Diabetes Coordinator Inpatient Diabetes Program [phone] (Team Pager from 8am to 5pm)
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
## 2022-08-30 NOTE — NC FL2 (Signed)
[place] MEDICAID FL2 LEVEL OF CARE FORM     IDENTIFICATION  Patient Name: Steve Alvarado Birthdate: [birth year] Sex: male Admission Date (Current Location): 08/28/2022  [facility] and [place] Number:  Chiropodist and Address:  [facility], [address] [place], [place] [address]      Provider Number: [id number]  Attending Physician Name and Address:  Loyce Dys, MD  Relative Name and Phone Number:  Johnchristian, Pagni (Spouse) [phone]    Current Level of Care: Hospital Recommended Level of Care: Skilled Nursing Facility Prior Approval Number:    Date Approved/Denied: 08/30/22 PASRR Number: [id number]  Discharge Plan: SNF    Current Diagnoses: Patient Active Problem List   Diagnosis Date Noted   UTI (urinary tract infection) 08/29/2022   Chronic anemia 08/28/2022   Unable to care for self 08/28/2022   Intractable diarrhea 07/26/2022   E. coli UTI 07/26/2022   Uncontrolled type 2 diabetes mellitus with hyperglycemia, with long-term current use of insulin (HCC) 07/26/2022   Chronic diastolic CHF (congestive heart failure) (HCC) 07/26/2022   Coronary artery disease 07/26/2022   GERD with esophagitis 07/26/2022   Demand ischemia    Urinary tract infection, recurrent 11/08/2021   NSTEMI, Type ll (non-ST elevated myocardial infarction) (HCC) 11/07/2021   Acute on chronic heart failure with preserved ejection fraction (HFpEF) (HCC) 11/07/2021   Hematuria 11/07/2021   Acute on chronic blood loss anemia 11/07/2021   Hemiparesis affecting left side as late effect of cerebrovascular accident (CVA) (HCC) 11/07/2021   Acute respiratory failure with hypoxia (HCC) 11/07/2021   AKI (acute kidney injury) (HCC) 08/21/2021   Acute kidney injury (HCC) 08/20/2021   GERD without esophagitis 08/20/2021   Basal ganglia stroke (HCC) 08/12/2021   Type 2 diabetes mellitus with diabetic polyneuropathy, with long-term current use of insulin (HCC)  07/09/2021   Hypothyroidism 07/09/2021   Depression 10/21/2020   Non compliance w medication regimen 10/21/2020   Cocaine abuse (HCC) 09/05/2020   Adjustment disorder with emotional disturbance 09/05/2020   Essential hypertension 02/14/2011   Coronary artery disease involving native coronary artery of native heart without angina pectoris 08/13/2010   Stented coronary artery 08/13/2010    Orientation RESPIRATION BLADDER Height & Weight     Self, Time, Situation, Place  Normal Incontinent, External catheter Weight: 181 lb 7 oz (82.3 kg) Height:  6\' 2"  (188 cm)  BEHAVIORAL SYMPTOMS/MOOD NEUROLOGICAL BOWEL NUTRITION STATUS      Continent    AMBULATORY STATUS COMMUNICATION OF NEEDS Skin   Extensive Assist Verbally Normal                       Personal Care Assistance Level of Assistance  Bathing, Feeding, Dressing Bathing Assistance: Maximum assistance Feeding assistance: Limited assistance Dressing Assistance: Maximum assistance     Functional Limitations Info  Sight, Hearing, Speech Sight Info: Adequate Hearing Info: Adequate Speech Info: Adequate    SPECIAL CARE FACTORS FREQUENCY  PT (By licensed PT), OT (By licensed OT)     PT Frequency: 5 times a week OT Frequency: 5 times a week            Contractures Contractures Info: Not present    Additional Factors Info  Code Status, Allergies Code Status Info: FULL Allergies Info: Ace Inhibitors           Current Medications (08/30/2022):  This is the current hospital active medication list Current Facility-Administered Medications  Medication Dose Route Frequency Provider Last Rate Last  Admin   acetaminophen (TYLENOL) tablet 650 mg  650 mg Oral Q6H PRN Andris Baumann, MD       Or   acetaminophen (TYLENOL) suppository 650 mg  650 mg Rectal Q6H PRN Andris Baumann, MD       albuterol (PROVENTIL) (2.5 MG/3ML) 0.083% nebulizer solution 2.5 mg  2.5 mg Inhalation Q6H PRN Andris Baumann, MD       amLODipine  (NORVASC) tablet 10 mg  10 mg Oral q morning Andris Baumann, MD   10 mg at 08/30/22 0948   aspirin EC tablet 81 mg  81 mg Oral Daily Andris Baumann, MD   81 mg at 08/30/22 0948   atorvastatin (LIPITOR) tablet 40 mg  40 mg Oral Daily Lindajo Royal V, MD   40 mg at 08/30/22 0948   carvedilol (COREG) tablet 3.125 mg  3.125 mg Oral BID WC Lindajo Royal V, MD   3.125 mg at 08/30/22 0948   cefTRIAXone (ROCEPHIN) 1 g in sodium chloride 0.9 % 100 mL IVPB  1 g Intravenous Q24H Lindajo Royal V, MD 200 mL/hr at 08/30/22 0327 1 g at 08/30/22 0327   clopidogrel (PLAVIX) tablet 75 mg  75 mg Oral Daily Andris Baumann, MD   75 mg at 08/30/22 0948   DULoxetine (CYMBALTA) DR capsule 60 mg  60 mg Oral Daily Rosezetta Schlatter T, MD   60 mg at 08/30/22 0948   enoxaparin (LOVENOX) injection 40 mg  40 mg Subcutaneous Q24H Lindajo Royal V, MD   40 mg at 08/30/22 0830   feeding supplement (ENSURE ENLIVE / ENSURE PLUS) liquid 237 mL  237 mL Oral BID BM Rosezetta Schlatter T, MD   237 mL at 08/30/22 0949   ferrous sulfate tablet 325 mg  325 mg Oral Q48H Andris Baumann, MD   325 mg at 08/29/22 0906   HYDROcodone-acetaminophen (NORCO/VICODIN) 5-325 MG per tablet 1-2 tablet  1-2 tablet Oral Q4H PRN Andris Baumann, MD   2 tablet at 08/30/22 1014   insulin aspart (novoLOG) injection 0-5 Units  0-5 Units Subcutaneous QHS Andris Baumann, MD   3 Units at 08/29/22 2106   insulin aspart (novoLOG) injection 0-9 Units  0-9 Units Subcutaneous TID WC Andris Baumann, MD   2 Units at 08/30/22 1207   insulin glargine-yfgn (SEMGLEE) injection 20 Units  20 Units Subcutaneous QHS Andris Baumann, MD   20 Units at 08/29/22 2106   levothyroxine (SYNTHROID) tablet 75 mcg  75 mcg Oral QHS Andris Baumann, MD   75 mcg at 08/29/22 2105   melatonin tablet 5 mg  5 mg Oral QHS Lindajo Royal V, MD   5 mg at 08/29/22 2105   ondansetron (ZOFRAN) tablet 4 mg  4 mg Oral Q6H PRN Andris Baumann, MD       Or   ondansetron [facility]) injection 4 mg  4 mg Intravenous  Q6H PRN Andris Baumann, MD       traZODone (DESYREL) tablet 25 mg  25 mg Oral QHS PRN Andris Baumann, MD         Discharge Medications: Please see discharge summary for a list of discharge medications.  Relevant Imaging Results:  Relevant Lab Results:   Additional Information SS- [id number]  Allena Katz, LCSW

## 2022-08-31 DIAGNOSIS — E1165 Type 2 diabetes mellitus with hyperglycemia: Secondary | ICD-10-CM | POA: Diagnosis not present

## 2022-08-31 DIAGNOSIS — N390 Urinary tract infection, site not specified: Secondary | ICD-10-CM | POA: Diagnosis not present

## 2022-08-31 DIAGNOSIS — N179 Acute kidney failure, unspecified: Secondary | ICD-10-CM | POA: Diagnosis not present

## 2022-08-31 DIAGNOSIS — B9620 Unspecified Escherichia coli [E. coli] as the cause of diseases classified elsewhere: Secondary | ICD-10-CM | POA: Diagnosis not present

## 2022-08-31 DIAGNOSIS — Z794 Long term (current) use of insulin: Secondary | ICD-10-CM

## 2022-08-31 LAB — CBC WITH DIFFERENTIAL/PLATELET
Abs Immature Granulocytes: 0.02 10*3/uL (ref 0.00–0.07)
Basophils Absolute: 0 10*3/uL (ref 0.0–0.1)
Basophils Relative: 0 %
Eosinophils Absolute: 0.3 10*3/uL (ref 0.0–0.5)
Eosinophils Relative: 6 %
HCT: 28.7 % — ABNORMAL LOW (ref 39.0–52.0)
Hemoglobin: 9.2 g/dL — ABNORMAL LOW (ref 13.0–17.0)
Immature Granulocytes: 0 %
Lymphocytes Relative: 37 %
Lymphs Abs: 1.8 10*3/uL (ref 0.7–4.0)
MCH: 28 pg (ref 26.0–34.0)
MCHC: 32.1 g/dL (ref 30.0–36.0)
MCV: 87.5 fL (ref 80.0–100.0)
Monocytes Absolute: 0.4 10*3/uL (ref 0.1–1.0)
Monocytes Relative: 9 %
Neutro Abs: 2.3 10*3/uL (ref 1.7–7.7)
Neutrophils Relative %: 48 %
Platelets: 242 10*3/uL (ref 150–400)
RBC: 3.28 MIL/uL — ABNORMAL LOW (ref 4.22–5.81)
RDW: 13.3 % (ref 11.5–15.5)
WBC: 4.8 10*3/uL (ref 4.0–10.5)
nRBC: 0 % (ref 0.0–0.2)

## 2022-08-31 LAB — BASIC METABOLIC PANEL
Anion gap: 5 (ref 5–15)
BUN: 28 mg/dL — ABNORMAL HIGH (ref 8–23)
CO2: 25 mmol/L (ref 22–32)
Calcium: 8.5 mg/dL — ABNORMAL LOW (ref 8.9–10.3)
Chloride: 105 mmol/L (ref 98–111)
Creatinine, Ser: 1.01 mg/dL (ref 0.61–1.24)
GFR, Estimated: >60 mL/min (ref >60)
Glucose, Bld: 249 mg/dL — ABNORMAL HIGH (ref 70–99)
Potassium: 4 mmol/L (ref 3.5–5.1)
Sodium: 135 mmol/L (ref 135–145)

## 2022-08-31 LAB — GLUCOSE, CAPILLARY
Glucose-Capillary: 251 mg/dL — ABNORMAL HIGH (ref 70–99)
Glucose-Capillary: 251 mg/dL — ABNORMAL HIGH (ref 70–99)
Glucose-Capillary: 220 mg/dL — ABNORMAL HIGH (ref 70–99)
Glucose-Capillary: 199 mg/dL — ABNORMAL HIGH (ref 70–99)

## 2022-08-31 MED ORDER — ADULT MULTIVITAMIN W/MINERALS CH
1.0000 | ORAL_TABLET | Freq: Every day | ORAL | Status: DC
  Administered 2022-08-31 – 2022-09-01 (×2): 1 via ORAL
  Filled 2022-08-31 (×2): qty 1

## 2022-08-31 MED ORDER — INSULIN ASPART 100 UNIT/ML IJ SOLN
3.0000 [IU] | Freq: Three times a day (TID) | INTRAMUSCULAR | Status: DC
  Administered 2022-08-31 (×2): 3 [IU] via SUBCUTANEOUS
  Filled 2022-08-31 (×2): qty 1

## 2022-08-31 MED ORDER — BISACODYL 5 MG PO TBEC: 5.0000 mg | DELAYED_RELEASE_TABLET | Freq: Every day | ORAL | Status: DC | PRN

## 2022-08-31 MED ORDER — POLYETHYLENE GLYCOL 3350 17 G PO PACK
17.0000 g | PACK | Freq: Every day | ORAL | Status: DC
  Administered 2022-08-31 – 2022-09-01 (×2): 17 g via ORAL
  Filled 2022-08-31 (×2): qty 1

## 2022-08-31 MED ORDER — AMOXICILLIN-POT CLAVULANATE 875-125 MG PO TABS
1.0000 | ORAL_TABLET | Freq: Two times a day (BID) | ORAL | Status: DC
  Administered 2022-09-01: 1 via ORAL
  Filled 2022-08-31: qty 1

## 2022-08-31 MED ORDER — SENNOSIDES-DOCUSATE SODIUM 8.6-50 MG PO TABS
1.0000 | ORAL_TABLET | Freq: Two times a day (BID) | ORAL | Status: DC
  Administered 2022-08-31 – 2022-09-01 (×3): 1 via ORAL
  Filled 2022-08-31 (×3): qty 1
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
## 2022-08-31 NOTE — TOC Progression Note (Signed)
Transition of Care [facility]) - Progression Note    Patient Details  Name: Steve Alvarado MRN: [id number] Date of Birth: 09-30-1955  Transition of Care [facility]) CM/SW Contact  Allena Katz, LCSW Phone Number: 08/31/2022, 9:34 AM  Clinical Narrative:     CSW spoke with pt about bed offers, pt would like to go to [facility] and would like to go [facility]. CSW spoke with Wynona Canes who reports we can go ahead and pursue auth.        Expected Discharge Plan and Services                                               Social Determinants of Health (SDOH) Interventions SDOH Screenings   Food Insecurity: No Food Insecurity (08/28/2022)  Housing: Low Risk  (08/28/2022)  Transportation Needs: No Transportation Needs (08/28/2022)  Utilities: Not At Risk (08/28/2022)  Depression (PHQ2-9): Low Risk  (12/31/2021)  Tobacco Use: Low Risk  (08/27/2022)    Readmission Risk Interventions    11/08/2021   11:09 AM  Readmission Risk Prevention Plan  Transportation Screening Complete  Medication Review (RN Care Manager) Complete  PCP or Specialist appointment within 3-5 days of discharge Complete  SW Recovery Care/Counseling Consult Complete  Palliative Care Screening Not Applicable  Skilled Nursing Facility Complete
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
## 2022-08-31 NOTE — TOC Progression Note (Signed)
Transition of Care [facility]) - Progression Note    Patient Details  Name: Steve Alvarado MRN: [id number] Date of Birth: [birth year]  Transition of Care [facility]) CM/SW Contact  Allena Katz, LCSW Phone Number: 08/31/2022, 1:52 PM  Clinical Narrative:   Pt has auth approved for [facility]. CSW spoke with Wynona Canes who states they can take the patient tomorrow.         Expected Discharge Plan and Services                                               Social Determinants of Health (SDOH) Interventions SDOH Screenings   Food Insecurity: No Food Insecurity (08/28/2022)  Housing: Low Risk  (08/28/2022)  Transportation Needs: No Transportation Needs (08/28/2022)  Utilities: Not At Risk (08/28/2022)  Depression (PHQ2-9): Low Risk  (12/31/2021)  Tobacco Use: Low Risk  (08/27/2022)    Readmission Risk Interventions    11/08/2021   11:09 AM  Readmission Risk Prevention Plan  Transportation Screening Complete  Medication Review (RN Care Manager) Complete  PCP or Specialist appointment within 3-5 days of discharge Complete  SW Recovery Care/Counseling Consult Complete  Palliative Care Screening Not Applicable  Skilled Nursing Facility Complete

## 2022-08-31 NOTE — TOC Progression Note (Signed)
Transition of Care [facility]) - Progression Note    Patient Details  Name: Steve Alvarado MRN: [id number] Date of Birth: Jun 21, 1955  Transition of Care [facility]) CM/SW Contact  Allena Katz, LCSW Phone Number: 08/31/2022, 10:20 AM  Clinical Narrative:    Pt medically ready to discharge per MD auth started for [facility].         Expected Discharge Plan and Services                                               Social Determinants of Health (SDOH) Interventions SDOH Screenings   Food Insecurity: No Food Insecurity (08/28/2022)  Housing: Low Risk  (08/28/2022)  Transportation Needs: No Transportation Needs (08/28/2022)  Utilities: Not At Risk (08/28/2022)  Depression (PHQ2-9): Low Risk  (12/31/2021)  Tobacco Use: Low Risk  (08/27/2022)    Readmission Risk Interventions    11/08/2021   11:09 AM  Readmission Risk Prevention Plan  Transportation Screening Complete  Medication Review (RN Care Manager) Complete  PCP or Specialist appointment within 3-5 days of discharge Complete  SW Recovery Care/Counseling Consult Complete  Palliative Care Screening Not Applicable  Skilled Nursing Facility Complete
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
## 2022-08-31 NOTE — Progress Notes (Signed)
Mobility Specialist - Progress Note   08/31/22 0935  Mobility  Activity Moved into chair position in bed;Turned to right side;Turned to left side  Level of Assistance Modified independent, requires aide device or extra time  Assistive Device None  Distance Ambulated (ft) 0 ft  Activity Response Tolerated well  Mobility Referral Yes  $Mobility charge 1 Mobility  Mobility Specialist Start Time (ACUTE ONLY) 0825  Mobility Specialist Stop Time (ACUTE ONLY) [id number]  Mobility Specialist Time Calculation (min) (ACUTE ONLY) 8 min   Author responds to bed alarm. Pt attempting to adjust in bed for breakfast. Pt defers OOB to chair for breakfast. Pt able to turn left/right and adjust bed into chair position with VC. Pt left in bed with needs in reach and alarm active.   Terrilyn Saver  Mobility Specialist  08/31/22 9:38 AM

## 2022-09-01 ENCOUNTER — Encounter: Payer: Self-pay | Admitting: Internal Medicine

## 2022-09-01 DIAGNOSIS — N390 Urinary tract infection, site not specified: Secondary | ICD-10-CM | POA: Diagnosis not present

## 2022-09-01 DIAGNOSIS — E1165 Type 2 diabetes mellitus with hyperglycemia: Secondary | ICD-10-CM | POA: Diagnosis not present

## 2022-09-01 DIAGNOSIS — I10 Essential (primary) hypertension: Secondary | ICD-10-CM

## 2022-09-01 DIAGNOSIS — E440 Moderate protein-calorie malnutrition: Secondary | ICD-10-CM | POA: Diagnosis present

## 2022-09-01 DIAGNOSIS — I5032 Chronic diastolic (congestive) heart failure: Secondary | ICD-10-CM | POA: Diagnosis not present

## 2022-09-01 LAB — GLUCOSE, CAPILLARY
Glucose-Capillary: 230 mg/dL — ABNORMAL HIGH (ref 70–99)
Glucose-Capillary: 205 mg/dL — ABNORMAL HIGH (ref 70–99)

## 2022-09-01 MED ORDER — HYDROCODONE-ACETAMINOPHEN 5-325 MG PO TABS: 1.0000 | ORAL_TABLET | ORAL | 0 refills | Status: DC | PRN

## 2022-09-01 MED ORDER — AMOXICILLIN-POT CLAVULANATE 875-125 MG PO TABS: 1.0000 | ORAL_TABLET | Freq: Two times a day (BID) | ORAL | 0 refills | Status: AC

## 2022-09-01 MED ORDER — CLOPIDOGREL BISULFATE 75 MG PO TABS: 75.0000 mg | ORAL_TABLET | Freq: Every day | ORAL | Status: AC

## 2022-09-01 MED ORDER — SENNOSIDES-DOCUSATE SODIUM 8.6-50 MG PO TABS: 1.0000 | ORAL_TABLET | Freq: Two times a day (BID) | ORAL | Status: AC

## 2022-09-01 MED ORDER — INSULIN ASPART 100 UNIT/ML IJ SOLN: 0.0000 [IU] | Freq: Three times a day (TID) | INTRAMUSCULAR | 11 refills | Status: DC

## 2022-09-01 MED ORDER — INSULIN GLARGINE-YFGN 100 UNIT/ML ~~LOC~~ SOLN
24.0000 [IU] | Freq: Every day | SUBCUTANEOUS | Status: DC
  Filled 2022-09-01: qty 0.24

## 2022-09-01 MED ORDER — LANTUS SOLOSTAR 100 UNIT/ML ~~LOC~~ SOPN: 24.0000 [IU] | PEN_INJECTOR | Freq: Every day | SUBCUTANEOUS | 11 refills | Status: DC

## 2022-09-01 MED ORDER — ADULT MULTIVITAMIN W/MINERALS CH: 1.0000 | ORAL_TABLET | Freq: Every day | ORAL | Status: AC

## 2022-09-01 MED ORDER — CARVEDILOL 6.25 MG PO TABS
6.2500 mg | ORAL_TABLET | Freq: Two times a day (BID) | ORAL | Status: DC
  Administered 2022-09-01: 6.25 mg via ORAL
  Filled 2022-09-01: qty 1

## 2022-09-01 MED ORDER — INSULIN ASPART 100 UNIT/ML IJ SOLN
5.0000 [IU] | Freq: Three times a day (TID) | INTRAMUSCULAR | Status: DC
  Administered 2022-09-01: 5 [IU] via SUBCUTANEOUS
  Filled 2022-09-01: qty 1

## 2022-09-01 MED ORDER — INSULIN ASPART 100 UNIT/ML IJ SOLN: 5.0000 [IU] | Freq: Three times a day (TID) | INTRAMUSCULAR | 11 refills | Status: DC

## 2022-09-01 MED ORDER — CARVEDILOL 6.25 MG PO TABS: 6.2500 mg | ORAL_TABLET | Freq: Two times a day (BID) | ORAL | Status: AC

## 2022-09-01 MED ORDER — ACETAMINOPHEN 325 MG PO TABS: 650.0000 mg | ORAL_TABLET | Freq: Four times a day (QID) | ORAL | Status: DC | PRN

## 2022-09-01 NOTE — Assessment & Plan Note (Signed)
Discharge on 3 more days Augmentin

## 2022-09-01 NOTE — TOC Transition Note (Signed)
Transition of Care [facility]) - CM/SW Discharge Note   Patient Details  Name: Steve Alvarado MRN: [id number] Date of Birth: 1955-10-18  Transition of Care [facility]) CM/SW Contact:  Allena Katz, LCSW Phone Number: 09/01/2022, 10:21 AM   Clinical Narrative:  Pt to discharge to [facility]. RN given number for report. CSW spoke with Wynona Canes at [employer] who states we are good to arrange transport. DC summary sent, Medical necessity printed to unit.        Final next level of care: Skilled Nursing Facility Barriers to Discharge: Barriers Resolved   Patient Goals and CMS Choice CMS Medicare.gov Compare Post Acute Care list provided to:: Patient Choice offered to / list presented to : Patient  Discharge Placement                Patient chooses bed at:  ([facility]) Patient to be transferred to facility by: [facility]      Discharge Plan and Services Additional resources added to the After Visit Summary for                                       Social Determinants of Health (SDOH) Interventions SDOH Screenings   Food Insecurity: No Food Insecurity (08/28/2022)  Housing: Low Risk  (08/28/2022)  Transportation Needs: No Transportation Needs (08/28/2022)  Utilities: Not At Risk (08/28/2022)  Depression (PHQ2-9): Low Risk  (12/31/2021)  Tobacco Use: Low Risk  (09/01/2022)     Readmission Risk Interventions    11/08/2021   11:09 AM  Readmission Risk Prevention Plan  Transportation Screening Complete  Medication Review (RN Care Manager) Complete  PCP or Specialist appointment within 3-5 days of discharge Complete  SW Recovery Care/Counseling Consult Complete  Palliative Care Screening Not Applicable  Skilled Nursing Facility Complete

## 2022-09-01 NOTE — Progress Notes (Signed)
Attempted to call report to [facility], transferred to a voice mail.

## 2022-09-01 NOTE — Progress Notes (Signed)
Attempted to call report again to [place], transferred to voicemail. Message left.

## 2022-09-01 NOTE — Care Management Important Message (Signed)
Important Message  Patient Details  Name: Steve Alvarado MRN: [id number] Date of Birth: 1955-05-03   Medicare Important Message Given:  Yes     Olegario Messier [name] 09/01/2022, 10:53 AM
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
## 2022-09-01 NOTE — Assessment & Plan Note (Signed)
Related to social / environmental circumstances as evidenced by mild fat depletion, moderate fat depletion, mild muscle depletion, moderate muscle depletion, percent weight loss.  --Appreciate dietitian recommendations --Started on MV, magic cups TID, double protein w/ meals, diet liberalized

## 2023-01-25 ENCOUNTER — Telehealth: Payer: Self-pay | Admitting: Internal Medicine

## 2023-01-25 NOTE — Telephone Encounter (Signed)
Toniann Fail reports that she went to see the patient yesterday and the patient and she are both concerned because his blood sugars have been running between 199-561 and are being checked AFTER he eats. He is also only getting the 10 units of Humalog after meals and 32 units of Lantus at bedtime. Most blood sugars are running 300-400 because it is being checked after he eats.   Patient is at [facility] in [place]. Apparently he is not a patient here at [facility] - but I wanted to make you aware.

## 2023-03-26 ENCOUNTER — Inpatient Hospital Stay (HOSPITAL_COMMUNITY): Payer: 59

## 2023-03-26 ENCOUNTER — Encounter (HOSPITAL_COMMUNITY): Payer: Self-pay

## 2023-03-26 ENCOUNTER — Emergency Department (HOSPITAL_COMMUNITY): Payer: 59

## 2023-03-26 ENCOUNTER — Other Ambulatory Visit: Payer: Self-pay

## 2023-03-26 ENCOUNTER — Inpatient Hospital Stay (HOSPITAL_COMMUNITY)
Admission: EM | Admit: 2023-03-26 | Discharge: 2023-03-28 | DRG: 682 | Disposition: A | Discharge status: Skilled Nursing Facility | Payer: 59 | Source: Skilled Nursing Facility | Attending: Family Medicine | Admitting: Family Medicine

## 2023-03-26 DIAGNOSIS — I5032 Chronic diastolic (congestive) heart failure: Secondary | ICD-10-CM | POA: Diagnosis present

## 2023-03-26 DIAGNOSIS — I130 Hypertensive heart and chronic kidney disease with heart failure and stage 1 through stage 4 chronic kidney disease, or unspecified chronic kidney disease: Secondary | ICD-10-CM | POA: Diagnosis present

## 2023-03-26 DIAGNOSIS — Z79890 Hormone replacement therapy (postmenopausal): Secondary | ICD-10-CM

## 2023-03-26 DIAGNOSIS — G4700 Insomnia, unspecified: Secondary | ICD-10-CM | POA: Diagnosis present

## 2023-03-26 DIAGNOSIS — Z8673 Personal history of transient ischemic attack (TIA), and cerebral infarction without residual deficits: Secondary | ICD-10-CM

## 2023-03-26 DIAGNOSIS — Z8679 Personal history of other diseases of the circulatory system: Secondary | ICD-10-CM

## 2023-03-26 DIAGNOSIS — Z1152 Encounter for screening for COVID-19: Secondary | ICD-10-CM | POA: Diagnosis not present

## 2023-03-26 DIAGNOSIS — D638 Anemia in other chronic diseases classified elsewhere: Secondary | ICD-10-CM | POA: Insufficient documentation

## 2023-03-26 DIAGNOSIS — Z7982 Long term (current) use of aspirin: Secondary | ICD-10-CM

## 2023-03-26 DIAGNOSIS — D631 Anemia in chronic kidney disease: Secondary | ICD-10-CM | POA: Diagnosis present

## 2023-03-26 DIAGNOSIS — R4182 Altered mental status, unspecified: Principal | ICD-10-CM

## 2023-03-26 DIAGNOSIS — I2510 Atherosclerotic heart disease of native coronary artery without angina pectoris: Secondary | ICD-10-CM | POA: Diagnosis present

## 2023-03-26 DIAGNOSIS — N189 Chronic kidney disease, unspecified: Secondary | ICD-10-CM | POA: Diagnosis not present

## 2023-03-26 DIAGNOSIS — N179 Acute kidney failure, unspecified: Secondary | ICD-10-CM | POA: Diagnosis not present

## 2023-03-26 DIAGNOSIS — I69354 Hemiplegia and hemiparesis following cerebral infarction affecting left non-dominant side: Secondary | ICD-10-CM

## 2023-03-26 DIAGNOSIS — Z79899 Other long term (current) drug therapy: Secondary | ICD-10-CM

## 2023-03-26 DIAGNOSIS — I69351 Hemiplegia and hemiparesis following cerebral infarction affecting right dominant side: Secondary | ICD-10-CM

## 2023-03-26 DIAGNOSIS — Z8249 Family history of ischemic heart disease and other diseases of the circulatory system: Secondary | ICD-10-CM

## 2023-03-26 DIAGNOSIS — R188 Other ascites: Secondary | ICD-10-CM | POA: Diagnosis present

## 2023-03-26 DIAGNOSIS — Z8659 Personal history of other mental and behavioral disorders: Secondary | ICD-10-CM | POA: Diagnosis not present

## 2023-03-26 DIAGNOSIS — E119 Type 2 diabetes mellitus without complications: Secondary | ICD-10-CM

## 2023-03-26 DIAGNOSIS — E875 Hyperkalemia: Secondary | ICD-10-CM | POA: Diagnosis present

## 2023-03-26 DIAGNOSIS — N1831 Chronic kidney disease, stage 3a: Secondary | ICD-10-CM | POA: Diagnosis present

## 2023-03-26 DIAGNOSIS — I10 Essential (primary) hypertension: Secondary | ICD-10-CM | POA: Diagnosis not present

## 2023-03-26 DIAGNOSIS — E785 Hyperlipidemia, unspecified: Secondary | ICD-10-CM | POA: Diagnosis present

## 2023-03-26 DIAGNOSIS — E1165 Type 2 diabetes mellitus with hyperglycemia: Secondary | ICD-10-CM | POA: Diagnosis present

## 2023-03-26 DIAGNOSIS — Z7902 Long term (current) use of antithrombotics/antiplatelets: Secondary | ICD-10-CM

## 2023-03-26 DIAGNOSIS — M62838 Other muscle spasm: Secondary | ICD-10-CM | POA: Diagnosis present

## 2023-03-26 DIAGNOSIS — N182 Chronic kidney disease, stage 2 (mild): Secondary | ICD-10-CM | POA: Diagnosis present

## 2023-03-26 DIAGNOSIS — Z87440 Personal history of urinary (tract) infections: Secondary | ICD-10-CM

## 2023-03-26 DIAGNOSIS — Z9861 Coronary angioplasty status: Secondary | ICD-10-CM

## 2023-03-26 DIAGNOSIS — F32A Depression, unspecified: Secondary | ICD-10-CM | POA: Diagnosis present

## 2023-03-26 DIAGNOSIS — E039 Hypothyroidism, unspecified: Secondary | ICD-10-CM | POA: Diagnosis present

## 2023-03-26 DIAGNOSIS — G9341 Metabolic encephalopathy: Secondary | ICD-10-CM | POA: Diagnosis present

## 2023-03-26 DIAGNOSIS — Z888 Allergy status to other drugs, medicaments and biological substances status: Secondary | ICD-10-CM

## 2023-03-26 DIAGNOSIS — Z794 Long term (current) use of insulin: Secondary | ICD-10-CM

## 2023-03-26 DIAGNOSIS — E038 Other specified hypothyroidism: Secondary | ICD-10-CM

## 2023-03-26 DIAGNOSIS — E1122 Type 2 diabetes mellitus with diabetic chronic kidney disease: Secondary | ICD-10-CM | POA: Diagnosis present

## 2023-03-26 LAB — AMMONIA: Ammonia: 17 umol/L (ref 9–35)

## 2023-03-26 LAB — RESP PANEL BY RT-PCR (RSV, FLU A&B, COVID)  RVPGX2
Influenza A by PCR: NEGATIVE
Influenza B by PCR: NEGATIVE
Resp Syncytial Virus by PCR: NEGATIVE
SARS Coronavirus 2 by RT PCR: NEGATIVE

## 2023-03-26 LAB — CBG MONITORING, ED
Glucose-Capillary: 229 mg/dL — ABNORMAL HIGH (ref 70–99)
Glucose-Capillary: 180 mg/dL — ABNORMAL HIGH (ref 70–99)

## 2023-03-26 LAB — CBC WITH DIFFERENTIAL/PLATELET
Abs Immature Granulocytes: 0.04 10*3/uL (ref 0.00–0.07)
Basophils Absolute: 0 10*3/uL (ref 0.0–0.1)
Basophils Relative: 0 %
Eosinophils Absolute: 0.4 10*3/uL (ref 0.0–0.5)
Eosinophils Relative: 6 %
HCT: 39.9 % (ref 39.0–52.0)
Hemoglobin: 12.5 g/dL — ABNORMAL LOW (ref 13.0–17.0)
Immature Granulocytes: 1 %
Lymphocytes Relative: 25 %
Lymphs Abs: 2 10*3/uL (ref 0.7–4.0)
MCH: 28.8 pg (ref 26.0–34.0)
MCHC: 31.3 g/dL (ref 30.0–36.0)
MCV: 91.9 fL (ref 80.0–100.0)
Monocytes Absolute: 0.7 10*3/uL (ref 0.1–1.0)
Monocytes Relative: 9 %
Neutro Abs: 4.8 10*3/uL (ref 1.7–7.7)
Neutrophils Relative %: 59 %
Platelets: 248 10*3/uL (ref 150–400)
RBC: 4.34 MIL/uL (ref 4.22–5.81)
RDW: 13.6 % (ref 11.5–15.5)
WBC: 8 10*3/uL (ref 4.0–10.5)
nRBC: 0 % (ref 0.0–0.2)

## 2023-03-26 LAB — COMPREHENSIVE METABOLIC PANEL
ALT: 21 U/L (ref 0–44)
AST: 16 U/L (ref 15–41)
Albumin: 3.4 g/dL — ABNORMAL LOW (ref 3.5–5.0)
Alkaline Phosphatase: 99 U/L (ref 38–126)
Anion gap: 8 (ref 5–15)
BUN: 40 mg/dL — ABNORMAL HIGH (ref 8–23)
CO2: 22 mmol/L (ref 22–32)
Calcium: 9.4 mg/dL (ref 8.9–10.3)
Chloride: 108 mmol/L (ref 98–111)
Creatinine, Ser: 2.27 mg/dL — ABNORMAL HIGH (ref 0.61–1.24)
GFR, Estimated: 31 mL/min — ABNORMAL LOW (ref >60)
Glucose, Bld: 216 mg/dL — ABNORMAL HIGH (ref 70–99)
Potassium: 5.8 mmol/L — ABNORMAL HIGH (ref 3.5–5.1)
Sodium: 138 mmol/L (ref 135–145)
Total Bilirubin: 0.7 mg/dL (ref 0.0–1.2)
Total Protein: 7.5 g/dL (ref 6.5–8.1)

## 2023-03-26 LAB — I-STAT VENOUS BLOOD GAS, ED
Acid-base deficit: 2 mmol/L (ref 0.0–2.0)
Bicarbonate: 24.1 mmol/L (ref 20.0–28.0)
Calcium, Ion: 1.17 mmol/L (ref 1.15–1.40)
HCT: 37 % — ABNORMAL LOW (ref 39.0–52.0)
Hemoglobin: 12.6 g/dL — ABNORMAL LOW (ref 13.0–17.0)
O2 Saturation: 99 %
Potassium: 5.8 mmol/L — ABNORMAL HIGH (ref 3.5–5.1)
Sodium: 140 mmol/L (ref 135–145)
TCO2: 25 mmol/L (ref 22–32)
pCO2, Ven: 43.3 mm[Hg] — ABNORMAL LOW (ref 44–60)
pH, Ven: 7.353 (ref 7.25–7.43)
pO2, Ven: 125 mm[Hg] — ABNORMAL HIGH (ref 32–45)

## 2023-03-26 LAB — BRAIN NATRIURETIC PEPTIDE: B Natriuretic Peptide: 63.8 pg/mL (ref 0.0–100.0)

## 2023-03-26 LAB — TSH: TSH: 2.18 u[IU]/mL (ref 0.350–4.500)

## 2023-03-26 LAB — LACTIC ACID, PLASMA
Lactic Acid, Venous: 0.9 mmol/L (ref 0.5–1.9)
Lactic Acid, Venous: 0.9 mmol/L (ref 0.5–1.9)

## 2023-03-26 LAB — PROCALCITONIN: Procalcitonin: <0.1 ng/mL

## 2023-03-26 MED ORDER — ASPIRIN 81 MG PO TBEC
81.0000 mg | DELAYED_RELEASE_TABLET | Freq: Every day | ORAL | Status: DC
  Administered 2023-03-26 – 2023-03-28 (×3): 81 mg via ORAL
  Filled 2023-03-26 (×3): qty 1

## 2023-03-26 MED ORDER — SODIUM ZIRCONIUM CYCLOSILICATE 10 G PO PACK
10.0000 g | PACK | Freq: Once | ORAL | Status: AC
  Administered 2023-03-26: 10 g via ORAL
  Filled 2023-03-26: qty 1

## 2023-03-26 MED ORDER — ONDANSETRON HCL 4 MG PO TABS
4.0000 mg | ORAL_TABLET | Freq: Four times a day (QID) | ORAL | Status: DC | PRN
  Filled 2023-03-26: qty 1

## 2023-03-26 MED ORDER — CARVEDILOL 3.125 MG PO TABS: 6.2500 mg | ORAL_TABLET | Freq: Two times a day (BID) | ORAL | Status: DC

## 2023-03-26 MED ORDER — SODIUM CHLORIDE 0.9 % IV SOLN: INTRAVENOUS | Status: DC

## 2023-03-26 MED ORDER — INSULIN GLARGINE-YFGN 100 UNIT/ML ~~LOC~~ SOLN: 40.0000 [IU] | Freq: Every day | SUBCUTANEOUS | Status: DC

## 2023-03-26 MED ORDER — DULOXETINE HCL 60 MG PO CPEP
60.0000 mg | ORAL_CAPSULE | Freq: Every day | ORAL | Status: DC
Start: 2023-03-27 — End: 2023-03-28
  Administered 2023-03-27 – 2023-03-28 (×2): 60 mg via ORAL
  Filled 2023-03-26: qty 2
  Filled 2023-03-26: qty 1

## 2023-03-26 MED ORDER — SODIUM CHLORIDE 0.9% FLUSH
3.0000 mL | Freq: Two times a day (BID) | INTRAVENOUS | Status: DC
  Administered 2023-03-26 – 2023-03-28 (×3): 3 mL via INTRAVENOUS

## 2023-03-26 MED ORDER — SENNOSIDES-DOCUSATE SODIUM 8.6-50 MG PO TABS: 1.0000 | ORAL_TABLET | Freq: Every evening | ORAL | Status: DC | PRN

## 2023-03-26 MED ORDER — INSULIN ASPART 100 UNIT/ML IJ SOLN
0.0000 [IU] | Freq: Three times a day (TID) | INTRAMUSCULAR | Status: DC
  Administered 2023-03-27 – 2023-03-28 (×2): 2 [IU] via SUBCUTANEOUS

## 2023-03-26 MED ORDER — ACETAMINOPHEN 325 MG PO TABS: 650.0000 mg | ORAL_TABLET | Freq: Four times a day (QID) | ORAL | Status: DC | PRN

## 2023-03-26 MED ORDER — INSULIN GLARGINE-YFGN 100 UNIT/ML ~~LOC~~ SOLN
40.0000 [IU] | Freq: Every day | SUBCUTANEOUS | Status: DC
  Filled 2023-03-26: qty 0.4

## 2023-03-26 MED ORDER — SODIUM CHLORIDE 0.9% FLUSH: 3.0000 mL | INTRAVENOUS | Status: DC | PRN

## 2023-03-26 MED ORDER — ACETAMINOPHEN 650 MG RE SUPP: 650.0000 mg | Freq: Four times a day (QID) | RECTAL | Status: DC | PRN

## 2023-03-26 MED ORDER — FERROUS SULFATE 325 (65 FE) MG PO TABS
325.0000 mg | ORAL_TABLET | ORAL | Status: DC
Start: 2023-03-27 — End: 2023-03-28
  Administered 2023-03-27: 325 mg via ORAL
  Filled 2023-03-26: qty 1

## 2023-03-26 MED ORDER — ONDANSETRON HCL 4 MG/2ML IJ SOLN: 4.0000 mg | Freq: Four times a day (QID) | INTRAMUSCULAR | Status: DC | PRN

## 2023-03-26 MED ORDER — SODIUM CHLORIDE 0.9 % IV BOLUS
1000.0000 mL | Freq: Once | INTRAVENOUS | Status: AC
  Administered 2023-03-26: 1000 mL via INTRAVENOUS

## 2023-03-26 MED ORDER — SODIUM CHLORIDE 0.9 % IV SOLN
INTRAVENOUS | Status: DC
Start: 2023-03-26 — End: 2023-03-26

## 2023-03-26 MED ORDER — HEPARIN SODIUM (PORCINE) 5000 UNIT/ML IJ SOLN
5000.0000 [IU] | Freq: Three times a day (TID) | INTRAMUSCULAR | Status: DC
  Administered 2023-03-27 – 2023-03-28 (×5): 5000 [IU] via SUBCUTANEOUS
  Filled 2023-03-26 (×5): qty 1

## 2023-03-26 MED ORDER — SODIUM CHLORIDE 0.9 % IV SOLN: 250.0000 mL | INTRAVENOUS | Status: AC | PRN

## 2023-03-26 MED ORDER — LEVOTHYROXINE SODIUM 75 MCG PO TABS
75.0000 ug | ORAL_TABLET | Freq: Every day | ORAL | Status: DC
  Administered 2023-03-26 – 2023-03-27 (×2): 75 ug via ORAL
  Filled 2023-03-26 (×2): qty 1

## 2023-03-26 MED ORDER — ALBUTEROL SULFATE (2.5 MG/3ML) 0.083% IN NEBU: 2.5000 mg | INHALATION_SOLUTION | RESPIRATORY_TRACT | Status: DC | PRN

## 2023-03-26 MED ORDER — CLOPIDOGREL BISULFATE 75 MG PO TABS
75.0000 mg | ORAL_TABLET | Freq: Every day | ORAL | Status: DC
Start: 2023-03-26 — End: 2023-03-28
  Administered 2023-03-26 – 2023-03-28 (×3): 75 mg via ORAL
  Filled 2023-03-26 (×3): qty 1

## 2023-03-26 MED ORDER — CARVEDILOL 3.125 MG PO TABS
3.1250 mg | ORAL_TABLET | Freq: Two times a day (BID) | ORAL | Status: DC
  Administered 2023-03-27 – 2023-03-28 (×3): 3.125 mg via ORAL
  Filled 2023-03-26 (×3): qty 1

## 2023-03-26 MED ORDER — ATORVASTATIN CALCIUM 40 MG PO TABS
40.0000 mg | ORAL_TABLET | Freq: Every evening | ORAL | Status: DC
  Administered 2023-03-26 – 2023-03-27 (×2): 40 mg via ORAL
  Filled 2023-03-26 (×2): qty 1

## 2023-03-26 MED ORDER — AMLODIPINE BESYLATE 10 MG PO TABS
10.0000 mg | ORAL_TABLET | Freq: Every morning | ORAL | Status: DC
  Administered 2023-03-26 – 2023-03-28 (×3): 10 mg via ORAL
  Filled 2023-03-26: qty 2
  Filled 2023-03-26: qty 1
  Filled 2023-03-26: qty 2

## 2023-03-26 MED ORDER — INSULIN ASPART 100 UNIT/ML IJ SOLN
5.0000 [IU] | Freq: Once | INTRAMUSCULAR | Status: AC
  Administered 2023-03-26: 5 [IU] via SUBCUTANEOUS

## 2023-03-26 MED ORDER — HYDRALAZINE HCL 20 MG/ML IJ SOLN: 10.0000 mg | Freq: Three times a day (TID) | INTRAMUSCULAR | Status: DC | PRN

## 2023-03-26 MED ORDER — CALCIUM GLUCONATE-NACL 1-0.675 GM/50ML-% IV SOLN
1.0000 g | Freq: Once | INTRAVENOUS | Status: AC
  Administered 2023-03-26: 1000 mg via INTRAVENOUS
  Filled 2023-03-26: qty 50

## 2023-03-26 MED ORDER — INSULIN ASPART 100 UNIT/ML IJ SOLN: 0.0000 [IU] | Freq: Every day | INTRAMUSCULAR | Status: DC

## 2023-03-26 NOTE — H&P (Addendum)
 History and Physical    Steve Alvarado FMW:[id number] DOB: [birth year] DOA: 03/26/2023  PCP: Celinda Chrystal HERO, MD   Patient coming from: Home   Chief Complaint:  Chief Complaint  Patient presents with   Altered Mental Status   ED TRIAGE note:Pt BIB [facility] from [facility] d/t AMS. Facility found pt this morning with AMS and lethargy, aox4 at baseline. Pt was diagnosed with PNA 2wks ago and has been taking abx. Per EMS R lower rhonci, Hx of L-sided deficits from stroke. Pt has +3 generalized pitting edema and ascites. VSS per EMS. Awake to verbal only.  Aox4 at this time.   HPI:  Steve Alvarado is a 68 y.o. male with medical history significant of chronic diastolic heart failure, insulin -dependent DM type II, anemia of chronic disease, CAD, hypothyroidism, depression, CVA with left-sided hemiparesis, CKD stage II and essential hypertension presented to emergency department for evaluation for altered mental status. Patient was diagnosed with pneumonia 2 weeks ago has been taking antibiotic.  Family called EMS found to be rhonchi left lower lung field. Sensation to ED patient is still somnolence and very fatigued.  Patient wakes up with sternal rub and voice again goes back to sleep.  During my evaluation at the bedside patient is waking up and asking me what is wrong with me.  Patient reported that lately he is very lethargic and sleepy however unable to state that what medications he is taking or not.  Reported that he had pneumonia few weeks ago and probably completed course of antibiotic but patient is unsure. Patient denies any fever but complaining about some chills.  Reported he has weakness of the left side which is chronic in nature but there is no weakness on the right side, facial droop and speech difficulty. Patient denies any chest pain, palpitation or shortness of breath.   ED Course:  At presentation to ED patient found have elevated blood pressure 175/75 otherwise  hemodynamically stable. CBC showing stable H&H 12.5 and 39.9. CMP showed elevated potassium 5.8, elevated blood glucose 216, elevated creatinine 2.27, low albumin 3.4 low GFR 31. Normal TSH and ammonia level.  Lactic acid within normal range BNP 63.  Previous showed pH 7.3, pCO2 43, pO2 125 and bicarb 25.  Respiratory panel unremarkable.  Pending UA  CT head showed: Age-indeterminate infarct in the right thalamus is new compared with 09/17/2021. MRI is recommended for further evaluation.  Pending MRI of the brain without contrast and chest x-ray.  ED physician reported that patient is not seems volume overloaded even though patient has history of diastolic heart failure.  With the concern for AKI in the ED patient has been treated 1 L of NS bolus.  Given CT head showed possible new infarction of the right thalamus pending MRI    Significant labs in the ED: Lab Orders         Resp panel by RT-PCR (RSV, Flu A&B, Covid) Anterior Nasal Swab         CBC with Differential         Comprehensive metabolic panel         TSH         Urinalysis, w/ Reflex to Culture (Infection Suspected) -Urine, Clean Catch         Lactic acid, plasma         Ammonia         Brain natriuretic peptide         Sodium, urine, random  Creatinine, urine, random         Urinalysis, Routine w reflex microscopic -Urine, Clean Catch         Comprehensive metabolic panel         CBC         Hemoglobin A1c         Procalcitonin         I-Stat venous blood gas, ([facility] ED, [facility], [facility])         POC CBG, ED       Review of Systems:  Review of Systems  Constitutional:  Positive for malaise/fatigue. Negative for chills, fever and weight loss.  Respiratory:  Negative for cough, sputum production and shortness of breath.   Cardiovascular:  Negative for chest pain and palpitations.  Gastrointestinal:  Negative for heartburn and nausea.  Musculoskeletal:  Negative for back pain and myalgias.  Neurological:  Positive for  focal weakness. Negative for dizziness, speech change, weakness and headaches.  Psychiatric/Behavioral:  The patient is not nervous/anxious.   All other systems reviewed and are negative.   Past Medical History:  Diagnosis Date   Acute kidney injury (HCC) 08/20/2021   Basal ganglia stroke (HCC)    a. 07/2021 L sided wkns/facial droop/fall-->MRI brain: nonhemorrhagic infarct of posterior R lentiform nucleus and corona radiata measure. Remote lacunar infarcts of post cerebellum bilat and L thalamus.   CHF (congestive heart failure) (HCC)    CKD (chronic kidney disease), stage II    a. 08/2021 AKI w/ creat up to 3.4.   Cocaine use    Coronary artery disease    a. 07/2010 PCI: LAD 46m, LCX 80 diff/small, OM1 41m, RCA 58m (3.0x18 Vision BMS). EF 50%; b. 07/2013 Cath: LM nl, LAD 30p, 20d, LCX 20d, LCX 100, OM1 60, RCA 30p, 29m, RPDA 90-->Med Rx; c. 10/2020 MV: EF 46% (60-65% by echo), no ischemia. 3V cor Ca2+.   Depression    Diabetes mellitus    Type II   Diastolic dysfunction    a. 10/2020 Echo: EF 60-65%, no rwma, mild LVH, GrI DD, nl RV size/fxn; b. 07/2021 Echo: EF 60-65%, no rwma, GrI DD, nl RV fxn, mildly dil LA, triv MR; c. 10/2021 Echo: EF 55-60%, no rwma, GrI DD, nl RV fxn, mild-mod MR, Asc Ao 37mm.   Hyperlipidemia    Hypertension    Hypothyroidism     Past Surgical History:  Procedure Laterality Date   CARDIAC CATHETERIZATION  08/06/2010   Bare metal stent placed in RCA.   HAND SURGERY     left     reports that he has never smoked. He has never used smokeless tobacco. He reports current drug use. Drug: Cocaine. He reports that he does not drink alcohol.  Allergies  Allergen Reactions   Ace Inhibitors Cough    Reaction not listed on MAR    Family History  Problem Relation Age of Onset   Heart attack Mother     Prior to Admission medications   Medication Sig Start Date End Date Taking? Authorizing Provider  acetaminophen  (TYLENOL ) 500 MG tablet Take 500 mg by mouth 3  (three) times daily.   Yes [provider]  albuterol  (VENTOLIN  HFA) 108 (90 Base) MCG/ACT inhaler Inhale 2 puffs into the lungs every 6 (six) hours as needed for wheezing.   Yes [provider]  amLODipine  (NORVASC ) 10 MG tablet Take 1 tablet (10 mg total) by mouth every morning. 08/19/21  Yes de Clint Kill, Earle BRAVO, NP  aspirin   EC 81 MG tablet Take 1 tablet (81 mg total) by mouth daily. Swallow whole. 08/18/21  Yes Shafer, Jorene, NP  atorvastatin  (LIPITOR) 40 MG tablet Take 1 tablet (40 mg total) by mouth every evening. 10/23/20  Yes Maree Hue, MD  baclofen  (LIORESAL ) 10 MG tablet Take 10 mg by mouth 2 (two) times daily.   Yes [provider]  benzonatate  (TESSALON ) 100 MG capsule Take 100 mg by mouth every 6 (six) hours as needed for cough.   Yes [provider]  Calamine-Zinc  Oxide 8-8 % LOTN Apply 1 application  topically in the morning and at bedtime. Apply to scrotum topically every night and day shift for redness.   Yes [provider]  carboxymethylcellulose (REFRESH PLUS) 0.5 % SOLN Place 2 drops into both eyes every 4 (four) hours as needed (dry eyes).   Yes [provider]  carvedilol  (COREG ) 6.25 MG tablet Take 1 tablet (6.25 mg total) by mouth 2 (two) times daily with a meal. 09/01/22  Yes Fausto Sor A, DO  cetirizine (ZYRTEC) 10 MG tablet Take 10 mg by mouth daily.   Yes [provider]  clopidogrel  (PLAVIX ) 75 MG tablet Take 1 tablet (75 mg total) by mouth daily. 09/02/22  Yes Fausto Sor A, DO  cycloSPORINE (RESTASIS) 0.05 % ophthalmic emulsion Place 1 drop into both eyes 2 (two) times daily.   Yes [provider]  dapagliflozin propanediol (FARXIGA) 5 MG TABS tablet Take 5 mg by mouth daily.   Yes [provider]  dextromethorphan-guaiFENesin  (ROBITUSSIN-DM) 10-100 MG/5ML liquid Take 10 mLs by mouth every 6 (six) hours as needed for cough.   Yes [provider]  DULoxetine  (CYMBALTA ) 60 MG  capsule Take 60 mg by mouth daily.   Yes [provider]  ferrous sulfate  325 (65 FE) MG EC tablet Take 1 tablet (325 mg total) by mouth every other day. Patient taking differently: Take 325 mg by mouth daily. 11/09/21 03/26/23 Yes Wouk, Devaughn Sayres, MD  furosemide  (LASIX ) 20 MG tablet Take 1 tablet (20 mg total) by mouth every other day. Monitor volume status and change dose accordingly. 07/28/22 07/28/23 Yes Von Bellis, MD  HYDROcodone -acetaminophen  (NORCO/VICODIN) 5-325 MG tablet Take 1-2 tablets by mouth every 4 (four) hours as needed for moderate pain. Patient taking differently: Take 1-2 tablets by mouth 3 (three) times daily as needed for moderate pain (pain score 4-6). 09/01/22  Yes Fausto Sor A, DO  insulin  lispro (HUMALOG  KWIKPEN) 100 UNIT/ML KwikPen Inject 5-10 Units into the skin See admin instructions. Inject 10 units subcutaneously three times a day for diabetes. Inject 5 units subcutaneously as needed for diabetes, give additional 5 units with meals if blood sugar above 300.   Yes [provider]  LANTUS  SOLOSTAR 100 UNIT/ML Solostar Pen Inject 24 Units into the skin at bedtime. Patient taking differently: Inject 40 Units into the skin at bedtime. 09/01/22  Yes Fausto Sor A, DO  levothyroxine  (SYNTHROID ) 75 MCG tablet Take 75 mcg by mouth at bedtime. 2200 05/19/21  Yes [provider]  melatonin 5 MG TABS Take 5 mg by mouth at bedtime.   Yes [provider]  Multiple Vitamin (MULTIVITAMIN WITH MINERALS) TABS tablet Take 1 tablet by mouth daily. 09/02/22  Yes Fausto Sor A, DO  polyethylene glycol (MIRALAX  / GLYCOLAX ) 17 g packet Take 17 g by mouth daily as needed for mild constipation. 08/22/21  Yes Dahal, Chapman, MD  pregabalin  (LYRICA ) 75 MG capsule Take 75 mg by mouth 3 (  three) times daily.   Yes [provider]  sacubitril -valsartan  (ENTRESTO ) 24-26 MG Take 1 tablet by mouth 2 (two) times daily. 11/29/21  Yes Hackney, Tina A, FNP   senna-docusate (SENOKOT-S) 8.6-50 MG tablet Take 1 tablet by mouth 2 (two) times daily. Hold if having loose or frequent stools. 09/01/22  Yes Fausto Sor A, DO  traZODone  (DESYREL ) 150 MG tablet Take 150 mg by mouth at bedtime.   Yes [provider]  insulin  aspart (NOVOLOG ) 100 UNIT/ML injection Inject 5 Units into the skin 3 (three) times daily with meals. Patient not taking: Reported on 03/26/2023 09/01/22   Fausto Sor A, DO  insulin  aspart (NOVOLOG ) 100 UNIT/ML injection Inject 0-9 Units into the skin 3 (three) times daily with meals. Patient not taking: Reported on 03/26/2023 09/01/22   Fausto Sor LABOR, DO     Physical Exam: Vitals:   03/26/23 1636 03/26/23 1909 03/26/23 2145  BP: (!) 160/77 (!) 175/75 (!) 170/84  Pulse: 65 64 69  Resp: (!) 8 12 18   Temp: 97.7 F (36.5 C) 97.6 F (36.4 C)   TempSrc: Oral Oral   SpO2: 100% 100% 100%    Physical Exam Constitutional:      General: He is not in acute distress.    Appearance: He is not ill-appearing.     Comments: Patient is somnolent  HENT:     Head: Normocephalic and atraumatic.     Mouth/Throat:     Mouth: Mucous membranes are moist.  Eyes:     Conjunctiva/sclera: Conjunctivae normal.  Cardiovascular:     Rate and Rhythm: Normal rate and regular rhythm.     Pulses: Normal pulses.     Heart sounds: Normal heart sounds.  Pulmonary:     Effort: Pulmonary effort is normal.     Breath sounds: Normal breath sounds.  Abdominal:     General: Bowel sounds are normal.  Musculoskeletal:        General: Normal range of motion.     Cervical back: Neck supple.     Right lower leg: No edema.     Left lower leg: No edema.  Skin:    Capillary Refill: Capillary refill takes less than 2 seconds.     Findings: No lesion or rash.  Neurological:     Mental Status: He is oriented to person, place, and time.  Psychiatric:     Comments: Unable to assess      Labs on Admission: I have personally reviewed following  labs and imaging studies  CBC: Recent Labs  Lab 03/26/23 1810 03/26/23 1846  WBC 8.0  --   NEUTROABS 4.8  --   HGB 12.5* 12.6*  HCT 39.9 37.0*  MCV 91.9  --   PLT 248  --    Basic Metabolic Panel: Recent Labs  Lab 03/26/23 1810 03/26/23 1846  NA 138 140  K 5.8* 5.8*  CL 108  --   CO2 22  --   GLUCOSE 216*  --   BUN 40*  --   CREATININE 2.27*  --   CALCIUM  9.4  --    GFR: CrCl cannot be calculated (Unknown ideal weight.). Liver Function Tests: Recent Labs  Lab 03/26/23 1810  AST 16  ALT 21  ALKPHOS 99  BILITOT 0.7  PROT 7.5  ALBUMIN 3.4*   No results for input(s): LIPASE, AMYLASE in the last 168 hours. Recent Labs  Lab 03/26/23 1810  AMMONIA 17   Coagulation Profile: No results for input(s): INR,  PROTIME in the last 168 hours. Cardiac Enzymes: No results for input(s): CKTOTAL, CKMB, CKMBINDEX, TROPONINI, TROPONINIHS in the last 168 hours. BNP (last 3 results) Recent Labs    03/26/23 1810  BNP 63.8   HbA1C: No results for input(s): HGBA1C in the last 72 hours. CBG: Recent Labs  Lab 03/26/23 1814  GLUCAP 229*   Lipid Profile: No results for input(s): CHOL, HDL, LDLCALC, TRIG, CHOLHDL, LDLDIRECT in the last 72 hours. Thyroid  Function Tests: Recent Labs    03/26/23 1810  TSH 2.180   Anemia Panel: No results for input(s): VITAMINB12, FOLATE, FERRITIN, TIBC, IRON, RETICCTPCT in the last 72 hours. Urine analysis:    Component Value Date/Time   COLORURINE YELLOW (A) 08/28/2022 0233   APPEARANCEUR HAZY (A) 08/28/2022 0233   APPEARANCEUR Clear 08/22/2013 0057   LABSPEC 1.013 08/28/2022 0233   LABSPEC 1.017 08/22/2013 0057   PHURINE 5.0 08/28/2022 0233   GLUCOSEU >=500 (A) 08/28/2022 0233   GLUCOSEU >=500 08/22/2013 0057   HGBUR SMALL (A) 08/28/2022 0233   BILIRUBINUR NEGATIVE 08/28/2022 0233   BILIRUBINUR Negative 08/22/2013 0057   KETONESUR NEGATIVE 08/28/2022 0233   PROTEINUR 100 (A)  08/28/2022 0233   NITRITE NEGATIVE 08/28/2022 0233   LEUKOCYTESUR MODERATE (A) 08/28/2022 0233   LEUKOCYTESUR Negative 08/22/2013 0057    Radiological Exams on Admission: I have personally reviewed images MR BRAIN WO CONTRAST Result Date: 03/26/2023 CLINICAL DATA:  Mental status change, unknown cause altered mental status EXAM: MRI HEAD WITHOUT CONTRAST TECHNIQUE: Multiplanar, multiecho pulse sequences of the brain and surrounding structures were obtained without intravenous contrast. COMPARISON:  MRI August 20, 2021.  CT head from today. FINDINGS: Brain: Remote right subinsular and thalamic infarct with wallerian degenerative change extending into the descending white matter tracks and right midbrain. No acute infarct, acute hemorrhage, mass lesion or midline shift. No hydrocephalus. Remote infarcts in the cerebellum bilaterally. Cerebral atrophy. Vascular: Remote infarct in the right basal ganglia with wallerian degenerative change extending into the right midbrain. Skull and upper cervical spine: Normal marrow signal. Sinuses/Orbits: Mostly clear sinuses.  No acute orbital findings. Other: No sizable mastoid effusions. IMPRESSION: 1. No evidence of acute intracranial abnormality. 2. Remote right subinsular and thalamic infarct with wallerian degenerative change. Electronically Signed   By: Gilmore GORMAN Molt M.D.   On: 03/26/2023 21:42   DG Chest Portable 1 View Result Date: 03/26/2023 CLINICAL DATA:  Recent pneumonia and altered mental status EXAM: PORTABLE CHEST 1 VIEW COMPARISON:  07/25/2022 FINDINGS: Low lung volumes. Stable cardiomediastinal silhouette. Pulmonary vascular congestion. Right basilar atelectasis or pneumonia. No definite pleural effusion. No pneumothorax. IMPRESSION: Low lung volumes with right basilar atelectasis or pneumonia. Follow-up in 6-8 weeks is recommended to ensure resolution. Electronically Signed   By: Norman Gatlin M.D.   On: 03/26/2023 21:28   CT HEAD WO CONTRAST  ( ) Result Date: 03/26/2023 CLINICAL DATA:  Mental status change, unknown cause EXAM: CT HEAD WITHOUT CONTRAST TECHNIQUE: Contiguous axial images were obtained from the base of the skull through the vertex without intravenous contrast. RADIATION DOSE REDUCTION: This exam was performed according to the departmental dose-optimization program which includes automated exposure control, adjustment of the mA and/or kV according to patient size and/or use of iterative reconstruction technique. COMPARISON:  09/17/2021 FINDINGS: Brain: Hypoattenuation in the right thalamus (series 3/image 18) is compatible with age-indeterminate infarct is new compared with 09/17/2021. No intracranial hemorrhage or mass effect. No hydrocephalus. No extra-axial fluid collection. Generalized cerebral atrophy and chronic small vessel ischemic disease. Chronic infarcts  in the right basal ganglia and right periventricular white matter. Vascular: No hyperdense vessel. Intracranial arterial calcification. Skull: No fracture or focal lesion. Sinuses/Orbits: No acute finding. Similar mild mucosal thickening in the paranasal sinuses. Other: None. IMPRESSION: Age-indeterminate infarct in the right thalamus is new compared with 09/17/2021. MRI is recommended for further evaluation. These results were called by telephone at the time of interpretation on 03/26/2023 at 7:08 pm to provider [facility] , who verbally acknowledged these results. Electronically Signed   By: Norman Gatlin M.D.   On: 03/26/2023 19:10     EKG: My personal interpretation of EKG shows: EKG showed normal sinus rhythm heart rate 63.    Assessment/Plan: Principal Problem:   Acute metabolic encephalopathy Active Problems:   Acute kidney injury superimposed on CKD (HCC)   Chronic diastolic CHF (congestive heart failure) (HCC)   Hyperkalemia   Essential hypertension   Hypothyroidism   Anemia of chronic disease   History of CAD (coronary artery disease)    Insulin  dependent type 2 diabetes mellitus (HCC)   History of depression   History of CVA (cerebrovascular accident)    Assessment and Plan: Acute metabolic encephalopathy-likely secondary to polypharmacy > Patient presenting from home with complaining of more somnolence and lethargic.  Patient and family unsure which medication patient has been to before for the treatment of pneumonia however currently patient is taking multiple pain medications and sleeping medications include baclofen , Lyrica  and trazodone  which probably contributing to somnolence. -At presentation to ED patient found hypertensive blood pressure 175/75.  CBC shows stable H&H.  CMP showed elevated creatinine and potassium level.  Normal TSH and ammonia level.  Normal BNP.  VBG low pCO2.  Checking blood alcohol level. - CT head showed age-indeterminate infraction in the right thalamus new compared to 08/2021. -Pending MRI of the brain and chest x-ray. - Will follow-up with the MRI in case patient developed new stroke which is contributing to somnolence possibly. -Continue neurocheck every 4 hours.  Avoiding pain medications as well as baclofen , Lyrica  and trazodone  in the setting of acute metabolic encephalopathy. - Continue fall and aspiration precaution. -Continue fall precaution Addendum: -MRI brain no evidence of acute endocrine abnormality. Remote right subinsular and thalamic infarct with wallerian degenerative change. -Patient is history of CVA in the past with residual right-sided weakness continue to manage for CVA as described below.  AKI on CKD stage II -Physical exam euvolemic.  Labs showed elevated creatinine 2.27.  Unclear etiology of AKI at this moment.  It is possible that patient has some intrarenal acute kidney injury in the setting of recent antibiotic use as well as combination of Farxiga, Entresto , Lasix , Lyrica  and baclofen . -Checking UA, urine creatinine urine sodium. -Placing Foley catheter to make sure  good urine output. -Given patient is evolving on physical exam and in the ED patient received 1 L of NS bolus holding further IV resuscitation at this time given patient has history of diastolic heart failure. -Obtaining bilateral lower ultrasound.  Hyperkalemia -Potassium 5.8.  No EKG change.  Hyperkalemia in setting of AKI. - Treating with insulin  5 unit, calcium  gluconate 1 g and Lokelma  10 g 1 dose.  Continue to follow with potassium level.  History of recent pneumonia 2 weeks ago -Patient is afebrile.  No leukocytosis.  Family reported completed course of antibiotic.  Respiratory panel unremarkable. - Pending chest x-ray. Addendum: - Chest x-ray showedLow lung volumes with right basilar atelectasis or pneumonia. Follow-up in 6-8 weeks is recommended to ensure resolution. -Given  patient is afebrile and no evidence of leukocytosis no concern for development of pneumonia at this point however  will continue to monitor for development of any fever and leukocytosis.  Holding off any antibiotic treatment at this time.  Checking procalcitonin level.  Essential hypertension Diastolic heart failure with preserved EF 55 to 60% - Elevated blood pressure while in the ED. -Resuming amlodipine  10 mg.  As heart rate is 63 decreasing dose of Coreg  6.25 mg to 3.1 2.5 mg twice daily.  Holding [place], Lasix  and Entresto  in the setting of AKI. -Continue hydralazine  as needed.  Hypothyroidism -Continue levothyroxine   Anemia of chronic disease -Continue oral iron supplement  History of CAD -Continue aspirin , Plavix , Lipitor and Coreg  (decreased dose 3.125 mg twice daily in the setting of decreased heart rate).  History of CVA with left-sided hemiparesis -Continue aspirin , Plavix , Lipitor.  History of depression -Continue Cymbalta   Chronic muscle spasm - Holding baclofen  setting of acute metabolic encephalopathy and AKI  Insomnia -holding trazodone  in the setting of acute metabolic  encephalopathy.  Insulin  dependent DM type II - Patient reported not taking short acting insulin  anymore while he taking long-acting insulin  -Continue Semglee  40 units daily, low sliding scale SSI and at bedtime insulin  as needed coverage with meal.  DVT prophylaxis:  SQ Heparin  Code Status:  Full Code-by default. Diet: Heart healthy carb modified diet Family Communication: None present at the bedside Disposition Plan: Pending improvement of mentation, pending MRI and chest x-ray. Consults: None at this time Admission status:   Inpatient, Telemetry bed  Severity of Illness: The appropriate patient status for this patient is INPATIENT. Inpatient status is judged to be reasonable and necessary in order to provide the required intensity of service to ensure the patient's safety. The patient's presenting symptoms, physical exam findings, and initial radiographic and laboratory data in the context of their chronic comorbidities is felt to place them at high risk for further clinical deterioration. Furthermore, it is not anticipated that the patient will be medically stable for discharge from the hospital within 2 midnights of admission.   * I certify that at the point of admission it is my clinical judgment that the patient will require inpatient hospital care spanning beyond 2 midnights from the point of admission due to high intensity of service, high risk for further deterioration and high frequency of surveillance required.Steve Alvarado    Jaylei Fuerte, MD [facility]  How to contact the [facility] Attending or Consulting provider 7A - 7P or covering provider during after hours 7P -7A, for this patient.  Check the care team in [facility] and look for a) attending/consulting [facility] provider listed and b) the [facility] team listed Log into [email or web address] and use [place]'s universal password to access. If you do not have the password, please contact the hospital operator. Locate the [facility] provider you are looking for  under [facility] and page to a number that you can be directly reached. If you still have difficulty reaching the provider, please page the [facility] (Director on Call) for the Hospitalists listed on amion for assistance.  03/26/2023, 9:56 PM

## 2023-03-26 NOTE — ED Triage Notes (Addendum)
 Pt BIB [facility] from [facility] d/t AMS. Facility found pt this morning with AMS and lethargy, aox4 at baseline. Pt was diagnosed with PNA 2wks ago and has been taking abx. Per EMS R lower rhonci, Hx of L-sided deficits from stroke. Pt has +3 generalized pitting edema and ascites. VSS per EMS. Awake to verbal only.  Aox4 at this time.

## 2023-03-26 NOTE — ED Provider Notes (Signed)
 [place] [facility] AT [facility] Provider Note   CSN: [id number] Arrival date & time: 03/26/23  1625     History  Chief Complaint  Patient presents with   Altered Mental Status    Steve Alvarado is a 68 y.o. male.  The history is provided by the patient and medical records. No language interpreter was used.  Altered Mental Status Presenting symptoms: confusion   Presenting symptoms comment:  Somnolence  Severity:  Moderate Episode history:  Continuous Progression:  Unchanged Associated symptoms: light-headedness   Associated symptoms: no abdominal pain, no agitation, no fever, no headaches, no nausea, no rash and no vomiting        Home Medications Prior to Admission medications   Medication Sig Start Date End Date Taking? Authorizing Provider  acetaminophen  (TYLENOL ) 325 MG tablet Take 2 tablets (650 mg total) by mouth every 6 (six) hours as needed for mild pain (or Fever >/= 101). 09/01/22   Fausto Burnard LABOR, DO  albuterol  (VENTOLIN  HFA) 108 (90 Base) MCG/ACT inhaler Inhale 2 puffs into the lungs every 6 (six) hours as needed for wheezing.    [provider]  amLODipine  (NORVASC ) 10 MG tablet Take 1 tablet (10 mg total) by mouth every morning. 08/19/21   de Clint Kill, Cortney E, NP  aspirin  EC 81 MG tablet Take 1 tablet (81 mg total) by mouth daily. Swallow whole. 08/18/21   Remi Pippin, NP  atorvastatin  (LIPITOR) 40 MG tablet Take 1 tablet (40 mg total) by mouth every evening. Patient taking differently: Take 40 mg by mouth daily. 10/23/20   Maree Hue, MD  carvedilol  (COREG ) 6.25 MG tablet Take 1 tablet (6.25 mg total) by mouth 2 (two) times daily with a meal. 09/01/22   Fausto Burnard A, DO  clopidogrel  (PLAVIX ) 75 MG tablet Take 1 tablet (75 mg total) by mouth daily. Patient taking differently: Take 75 mg by mouth at bedtime. 08/18/21   Remi Pippin, NP  clopidogrel  (PLAVIX ) 75 MG tablet Take 1 tablet (75 mg total) by mouth daily.  09/02/22   Fausto Burnard A, DO  DULoxetine  (CYMBALTA ) 30 MG capsule Take 60 mg by mouth daily.    [provider]  ferrous sulfate  325 (65 FE) MG EC tablet Take 1 tablet (325 mg total) by mouth every other day. 11/09/21 11/09/22  Wouk, Devaughn Sayres, MD  furosemide  (LASIX ) 20 MG tablet Take 1 tablet (20 mg total) by mouth every other day. Monitor volume status and change dose accordingly. 07/28/22 07/28/23  Von Bellis, MD  HYDROcodone -acetaminophen  (NORCO/VICODIN) 5-325 MG tablet Take 1-2 tablets by mouth every 4 (four) hours as needed for moderate pain. 09/01/22   Fausto Burnard LABOR, DO  insulin  aspart (NOVOLOG ) 100 UNIT/ML injection Inject 5 Units into the skin 3 (three) times daily with meals. 09/01/22   Fausto Burnard LABOR, DO  insulin  aspart (NOVOLOG ) 100 UNIT/ML injection Inject 0-9 Units into the skin 3 (three) times daily with meals. 09/01/22   Fausto Burnard LABOR, DO  LANTUS  SOLOSTAR 100 UNIT/ML Solostar Pen Inject 24 Units into the skin at bedtime. 09/01/22   Fausto Burnard LABOR, DO  levothyroxine  (SYNTHROID ) 75 MCG tablet Take 75 mcg by mouth at bedtime. 2200 05/19/21   [provider]  melatonin 5 MG TABS Take 5 mg by mouth at bedtime.    [provider]  Multiple Vitamin (MULTIVITAMIN WITH MINERALS) TABS tablet Take 1 tablet by mouth daily. 09/02/22   Fausto Burnard A, DO  polyethylene glycol (MIRALAX  /  GLYCOLAX ) 17 g packet Take 17 g by mouth daily as needed for mild constipation. Patient not taking: Reported on 08/28/2022 08/22/21   Arlice Reichert, MD  pregabalin  (LYRICA ) 50 MG capsule Take 1 capsule (50 mg total) by mouth 3 (three) times daily. 07/28/22   Von Bellis, MD  sacubitril -valsartan  (ENTRESTO ) 24-26 MG Take 1 tablet by mouth 2 (two) times daily. 11/29/21   Donette Ellouise LABOR, FNP  senna-docusate (SENOKOT-S) 8.6-50 MG tablet Take 1 tablet by mouth 2 (two) times daily. Hold if having loose or frequent stools. 09/01/22   Fausto Burnard LABOR, DO  traZODone  (DESYREL ) 50 MG tablet  Take 0.5 tablets (25 mg total) by mouth at bedtime as needed for sleep. Patient not taking: Reported on 08/28/2022 07/28/22   Von Bellis, MD      Allergies    Ace inhibitors    Review of Systems   Review of Systems  Constitutional:  Positive for fatigue. Negative for chills and fever.  HENT:  Negative for congestion.   Respiratory:  Negative for cough, chest tightness, shortness of breath and wheezing.   Cardiovascular:  Negative for chest pain.  Gastrointestinal:  Negative for abdominal pain, constipation, diarrhea, nausea and vomiting.  Genitourinary:  Negative for dysuria and flank pain.  Musculoskeletal:  Negative for back pain, neck pain and neck stiffness.  Skin:  Negative for rash and wound.  Neurological:  Positive for light-headedness. Negative for headaches.  Psychiatric/Behavioral:  Positive for confusion. Negative for agitation.   All other systems reviewed and are negative.   Physical Exam Updated Vital Signs BP (!) 160/77 (BP Location: Right Arm)   Pulse 65   Temp 97.7 F (36.5 C) (Oral)   Resp (!) 8   SpO2 100%  Physical Exam Vitals and nursing note reviewed.  Constitutional:      General: He is not in acute distress.    Appearance: He is well-developed. He is not ill-appearing, toxic-appearing or diaphoretic.  HENT:     Head: Normocephalic and atraumatic.     Nose: No congestion or rhinorrhea.     Mouth/Throat:     Mouth: Mucous membranes are dry.     Pharynx: No oropharyngeal exudate or posterior oropharyngeal erythema.  Eyes:     Extraocular Movements: Extraocular movements intact.     Conjunctiva/sclera: Conjunctivae normal.     Pupils: Pupils are equal, round, and reactive to light.  Cardiovascular:     Rate and Rhythm: Normal rate and regular rhythm.     Heart sounds: No murmur heard. Pulmonary:     Effort: Pulmonary effort is normal. No respiratory distress.     Breath sounds: Normal breath sounds. No wheezing, rhonchi or rales.  Chest:      Chest wall: No tenderness.  Abdominal:     General: Abdomen is flat.     Palpations: Abdomen is soft.     Tenderness: There is no abdominal tenderness. There is no right CVA tenderness, left CVA tenderness, guarding or rebound.  Musculoskeletal:        General: No swelling or tenderness.     Cervical back: Neck supple. No tenderness.  Skin:    General: Skin is warm and dry.     Capillary Refill: Capillary refill takes less than 2 seconds.     Findings: No erythema or rash.  Neurological:     Mental Status: He is alert. Mental status is at baseline.     Sensory: Sensory deficit present.     Motor: Weakness present.  Psychiatric:        Mood and Affect: Mood normal.     ED Results / Procedures / Treatments   Labs (all labs ordered are listed, but only abnormal results are displayed) Labs Reviewed  CBC WITH DIFFERENTIAL/PLATELET - Abnormal; Notable for the following components:      Result Value   Hemoglobin 12.5 (*)    All other components within normal limits  COMPREHENSIVE METABOLIC PANEL - Abnormal; Notable for the following components:   Potassium 5.8 (*)    Glucose, Bld 216 (*)    BUN 40 (*)    Creatinine, Ser 2.27 (*)    Albumin 3.4 (*)    GFR, Estimated 31 (*)    All other components within normal limits  I-STAT VENOUS BLOOD GAS, ED - Abnormal; Notable for the following components:   pCO2, Ven 43.3 (*)    pO2, Ven 125 (*)    Potassium 5.8 (*)    HCT 37.0 (*)    Hemoglobin 12.6 (*)    All other components within normal limits  CBG MONITORING, ED - Abnormal; Notable for the following components:   Glucose-Capillary 229 (*)    All other components within normal limits  RESP PANEL BY RT-PCR (RSV, FLU A&B, COVID)  [id number]  TSH  LACTIC ACID, PLASMA  LACTIC ACID, PLASMA  AMMONIA  BRAIN NATRIURETIC PEPTIDE  URINALYSIS, W/ REFLEX TO CULTURE (INFECTION SUSPECTED)  SODIUM, URINE, RANDOM  CREATININE, URINE, RANDOM  URINALYSIS, ROUTINE W REFLEX MICROSCOPIC   COMPREHENSIVE METABOLIC PANEL  CBC  HEMOGLOBIN A1C    EKG EKG Interpretation Date/Time:  Sunday March 26 2023 16:35:45 EST Ventricular Rate:  63 PR Interval:  293 QRS Duration:  89 QT Interval:  414 QTC Calculation: 424 R Axis:   3  Text Interpretation: Sinus rhythm Prolonged PR interval Nonspecific T abnormalities, lateral leads when compared to prior slower rate. No STEMI Confirmed by Ginger Barefoot ([id number]) on 03/26/2023 4:50:25 PM  Radiology CT HEAD WO CONTRAST ( ) Result Date: 03/26/2023 CLINICAL DATA:  Mental status change, unknown cause EXAM: CT HEAD WITHOUT CONTRAST TECHNIQUE: Contiguous axial images were obtained from the base of the skull through the vertex without intravenous contrast. RADIATION DOSE REDUCTION: This exam was performed according to the departmental dose-optimization program which includes automated exposure control, adjustment of the mA and/or kV according to patient size and/or use of iterative reconstruction technique. COMPARISON:  09/17/2021 FINDINGS: Brain: Hypoattenuation in the right thalamus (series 3/image 18) is compatible with age-indeterminate infarct is new compared with 09/17/2021. No intracranial hemorrhage or mass effect. No hydrocephalus. No extra-axial fluid collection. Generalized cerebral atrophy and chronic small vessel ischemic disease. Chronic infarcts in the right basal ganglia and right periventricular white matter. Vascular: No hyperdense vessel. Intracranial arterial calcification. Skull: No fracture or focal lesion. Sinuses/Orbits: No acute finding. Similar mild mucosal thickening in the paranasal sinuses. Other: None. IMPRESSION: Age-indeterminate infarct in the right thalamus is new compared with 09/17/2021. MRI is recommended for further evaluation. These results were called by telephone at the time of interpretation on 03/26/2023 at 7:08 pm to provider [facility] , who verbally acknowledged these results. Electronically Signed    By: Norman Gatlin M.D.   On: 03/26/2023 19:10    Procedures Procedures    Medications Ordered in ED Medications  aspirin  EC tablet 81 mg (has no administration in time range)  amLODipine  (NORVASC ) tablet 10 mg (has no administration in time range)  atorvastatin  (LIPITOR) tablet 40 mg (has no administration in time range)  DULoxetine  (CYMBALTA ) DR capsule 60 mg (has no administration in time range)  levothyroxine  (SYNTHROID ) tablet 75 mcg (has no administration in time range)  clopidogrel  (PLAVIX ) tablet 75 mg (has no administration in time range)  ferrous sulfate  tablet 325 mg (has no administration in time range)  heparin  injection 5,000 Units (has no administration in time range)  sodium zirconium cyclosilicate  (LOKELMA ) packet 10 g (has no administration in time range)  calcium  gluconate 1 g/ 50 mL sodium chloride  IVPB (has no administration in time range)  insulin  aspart (novoLOG ) injection 5 Units (has no administration in time range)  sodium chloride  flush (NS) 0.9 % injection 3 mL (has no administration in time range)  sodium chloride  flush (NS) 0.9 % injection 3 mL (has no administration in time range)  0.9 %  sodium chloride  infusion (has no administration in time range)  acetaminophen  (TYLENOL ) tablet 650 mg (has no administration in time range)    Or  acetaminophen  (TYLENOL ) suppository 650 mg (has no administration in time range)  senna-docusate (Senokot-S) tablet 1 tablet (has no administration in time range)  ondansetron  (ZOFRAN ) tablet 4 mg (has no administration in time range)    Or  ondansetron  (ZOFRAN ) injection 4 mg (has no administration in time range)  hydrALAZINE  (APRESOLINE ) injection 10 mg (has no administration in time range)  insulin  aspart (novoLOG ) injection 0-6 Units (has no administration in time range)  insulin  aspart (novoLOG ) injection 0-5 Units (has no administration in time range)  carvedilol  (COREG ) tablet 3.125 mg (has no administration in time  range)  insulin  glargine-yfgn (SEMGLEE ) injection 40 Units (has no administration in time range)  sodium chloride  0.9 % bolus 1,000 mL (1,000 mLs Intravenous New Bag/Given 03/26/23 2021)    ED Course/ Medical Decision Making/ A&P                                 Medical Decision Making Amount and/or Complexity of Data Reviewed Labs: ordered. Radiology: ordered.  Risk Decision regarding hospitalization.   Steve Alvarado is a 68 y.o. male with a past medical history significant for CAD status post PCI, hypertension, previous polysubstance abuse, diabetes, hypothyroidism, previous stroke with left-sided paralysis, CHF, and frequent urinary tract infections who presents for altered mental status and somnolence.  According to EMS report to nursing, this morning patient was found altered with altered mental status and lethargy.  Patient reportedly had pneumonia 2 weeks ago and has been on antibiotics and EMS found some rhonchi although he was not hypoxic.  He has some mild edema that he does not report any worsening of at this time.  He will answer some questions and says that other than feeling very sleepy and tired he is having no complaints.   He is denying any chest pain, shortness breath, palpitations, nausea, vomiting, constipation, diarrhea, or urinary changes.  He just feels tired and sleepy.  Denies any headache or neck pain.  Denies any vision or speech changes.  Denies other complaints.  On my exam, lungs were clear.  Chest is nontender.  Abdomen nontender.  He has weakness in his left arm left face and left leg and has numbness on the left side that he reports is unchanged from baseline.  He is answering questions appropriately but is somnolent and will be sleeping if he is not talking.  He will arouse to voice however.  Otherwise I do not see evidence of trauma.  No big  rashes.  Mild edema in legs that he reports is chronic.  Abdomen nontender.  Due to his history of stroke and  altered mental status we will get a head CT.  We will get screening labs to look for other causes of altered mental status and somnolence and will get chest x-ray given his recent pneumonia.  Will get COVID/flu/RSV swab given the amount in the community now.  Will get urinalysis given his reported history of frequent UTIs.   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  Patient is not sure when he had his previous stroke and radiology rec MRIs will get MRI without contrast.  Patient does have AKI compared to prior, this may be contribute to his altered mental status and fatigue.  Due to the AKI, he will need admission.  Will order some fluids for him and await the urinalysis as well.          Final Clinical Impression(s) / ED Diagnoses Final diagnoses:  Altered mental status, unspecified altered mental status type    Clinical Impression: 1. Altered mental status, unspecified altered mental status type     Disposition: Admit  This note was prepared with assistance of Dragon voice recognition software. Occasional wrong-word or sound-a-like substitutions may have occurred due to the inherent limitations of voice recognition software.      Ermin Parisien, Lonni PARAS, MD 03/26/23 2125

## 2023-03-26 NOTE — ED Notes (Signed)
 ED TO INPATIENT HANDOFF REPORT  ED Nurse Name and Phone #: Shelba [phone]  S Name/Age/Gender Steve Alvarado 68 y.o. male Room/Bed: 018C/018C  Code Status   Code Status: Full Code  Home/SNF/Other Skilled nursing facility Patient oriented to: self, place, time, and situation Is this baseline? Yes   Triage Complete: Triage complete  Chief Complaint Acute metabolic encephalopathy [G93.41]  Triage Note Pt BIB [facility] from [facility] d/t AMS. Facility found pt this morning with AMS and lethargy, aox4 at baseline. Pt was diagnosed with PNA 2wks ago and has been taking abx. Per EMS R lower rhonci, Hx of L-sided deficits from stroke. Pt has +3 generalized pitting edema and ascites. VSS per EMS. Awake to verbal only.  Aox4 at this time.    Allergies Allergies  Allergen Reactions   Ace Inhibitors Cough    Reaction not listed on MAR    Level of Care/Admitting Diagnosis ED Disposition     ED Disposition  Admit   Condition  --   Comment  Hospital Area: [facility] [[id number]]  Level of Care: Telemetry Medical [104]  May admit patient to [facility] or [facility] if equivalent level of care is available:: No  Covid Evaluation: Asymptomatic - no recent exposure (last 10 days) testing not required  Diagnosis: Acute metabolic encephalopathy [8430220]  Admitting Physician: SUNDIL, SUBRINA [[id number]]  Attending Physician: SUNDIL, SUBRINA [[id number]]  Certification:: I certify this patient will need inpatient services for at least 2 midnights  Expected Medical Readiness: 03/31/2023          B Medical/Surgery History Past Medical History:  Diagnosis Date   Acute kidney injury (HCC) 08/20/2021   Basal ganglia stroke (HCC)    a. 07/2021 L sided wkns/facial droop/fall-->MRI brain: nonhemorrhagic infarct of posterior R lentiform nucleus and corona radiata measure. Remote lacunar infarcts of post cerebellum bilat and L thalamus.   CHF (congestive heart failure)  (HCC)    CKD (chronic kidney disease), stage II    a. 08/2021 AKI w/ creat up to 3.4.   Cocaine use    Coronary artery disease    a. 07/2010 PCI: LAD 36m, LCX 80 diff/small, OM1 66m, RCA 7m (3.0x18 Vision BMS). EF 50%; b. 07/2013 Cath: LM nl, LAD 30p, 20d, LCX 20d, LCX 100, OM1 60, RCA 30p, 18m, RPDA 90-->Med Rx; c. 10/2020 MV: EF 46% (60-65% by echo), no ischemia. 3V cor Ca2+.   Depression    Diabetes mellitus    Type II   Diastolic dysfunction    a. 10/2020 Echo: EF 60-65%, no rwma, mild LVH, GrI DD, nl RV size/fxn; b. 07/2021 Echo: EF 60-65%, no rwma, GrI DD, nl RV fxn, mildly dil LA, triv MR; c. 10/2021 Echo: EF 55-60%, no rwma, GrI DD, nl RV fxn, mild-mod MR, Asc Ao 37mm.   Hyperlipidemia    Hypertension    Hypothyroidism    Past Surgical History:  Procedure Laterality Date   CARDIAC CATHETERIZATION  08/06/2010   Bare metal stent placed in RCA.   HAND SURGERY     left     A IV Location/Drains/Wounds Patient Lines/Drains/Airways Status     Active Line/Drains/Airways     Name Placement date Placement time Site Days   Peripheral IV 03/26/23 18 G Right Antecubital 03/26/23  1841  Antecubital  less than 1   External Urinary Catheter 08/28/22  1216  --  210            Intake/Output Last 24 hours No intake  or output data in the 24 hours ending 03/26/23 2055  Labs/Imaging Results for orders placed or performed during the hospital encounter of 03/26/23 (from the past 48 hours)  CBC with Differential     Status: Abnormal   Collection Time: 03/26/23  6:10 PM  Result Value Ref Range   WBC 8.0 4.0 - 10.5 K/uL   RBC 4.34 4.22 - 5.81 MIL/uL   Hemoglobin 12.5 (L) 13.0 - 17.0 g/dL   HCT 60.0 60.9 - 47.9 %   MCV 91.9 80.0 - 100.0 fL   MCH 28.8 26.0 - 34.0 pg   MCHC 31.3 30.0 - 36.0 g/dL   RDW 86.3 88.4 - 84.4 %   Platelets 248 150 - 400 K/uL   nRBC 0.0 0.0 - 0.2 %   Neutrophils Relative % 59 %   Neutro Abs 4.8 1.7 - 7.7 K/uL   Lymphocytes Relative 25 %   Lymphs Abs 2.0 0.7 -  4.0 K/uL   Monocytes Relative 9 %   Monocytes Absolute 0.7 0.1 - 1.0 K/uL   Eosinophils Relative 6 %   Eosinophils Absolute 0.4 0.0 - 0.5 K/uL   Basophils Relative 0 %   Basophils Absolute 0.0 0.0 - 0.1 K/uL   Immature Granulocytes 1 %   Abs Immature Granulocytes 0.04 0.00 - 0.07 K/uL    Comment: Performed at [facility], [address], [place], [place] [address]  Comprehensive metabolic panel     Status: Abnormal   Collection Time: 03/26/23  6:10 PM  Result Value Ref Range   Sodium 138 135 - 145 mmol/L   Potassium 5.8 (H) 3.5 - 5.1 mmol/L   Chloride 108 98 - 111 mmol/L   CO2 22 22 - 32 mmol/L   Glucose, Bld 216 (H) 70 - 99 mg/dL    Comment: Glucose reference range applies only to samples taken after fasting for at least 8 hours.   BUN 40 (H) 8 - 23 mg/dL   Creatinine, Ser 7.72 (H) 0.61 - 1.24 mg/dL   Calcium  9.4 8.9 - 10.3 mg/dL   Total Protein 7.5 6.5 - 8.1 g/dL   Albumin 3.4 (L) 3.5 - 5.0 g/dL   AST 16 15 - 41 U/L   ALT 21 0 - 44 U/L   Alkaline Phosphatase 99 38 - 126 U/L   Total Bilirubin 0.7 0.0 - 1.2 mg/dL   GFR, Estimated 31 (L) >60 mL/min    Comment: (NOTE) Calculated using the CKD-EPI Creatinine Equation (2021)    Anion gap 8 5 - 15    Comment: Performed at [facility], [address], [place], [place] [address]  TSH     Status: None   Collection Time: 03/26/23  6:10 PM  Result Value Ref Range   TSH 2.180 0.350 - 4.500 uIU/mL    Comment: Performed by a 3rd Generation assay with a functional sensitivity of <=0.01 uIU/mL. Performed at [facility], [address], [place], [place] [address]   Lactic acid, plasma     Status: None   Collection Time: 03/26/23  6:10 PM  Result Value Ref Range   Lactic Acid, Venous 0.9 0.5 - 1.9 mmol/L    Comment: Performed at [facility], [address], [place], [place] [address]  Ammonia     Status: None   Collection Time: 03/26/23  6:10 PM  Result Value Ref Range   Ammonia 17 9 - 35 umol/L     Comment: Performed at [facility], [address] [place], [place] [address]  Brain natriuretic peptide     Status: None   Collection Time: 03/26/23  6:10 PM  Result Value Ref Range   B Natriuretic Peptide 63.8 0.0 - 100.0 pg/mL    Comment: Performed at [facility], [address], [place], [place] [address]  POC CBG, ED     Status: Abnormal   Collection Time: 03/26/23  6:14 PM  Result Value Ref Range   Glucose-Capillary 229 (H) 70 - 99 mg/dL    Comment: Glucose reference range applies only to samples taken after fasting for at least 8 hours.  I-Stat venous blood gas, ([facility], [facility], [facility])     Status: Abnormal   Collection Time: 03/26/23  6:46 PM  Result Value Ref Range   pH, Ven 7.353 7.25 - 7.43   pCO2, Ven 43.3 (L) 44 - 60 mmHg   pO2, Ven 125 (H) 32 - 45 mmHg   Bicarbonate 24.1 20.0 - 28.0 mmol/L   TCO2 25 22 - 32 mmol/L   O2 Saturation 99 %   Acid-base deficit 2.0 0.0 - 2.0 mmol/L   Sodium 140 135 - 145 mmol/L   Potassium 5.8 (H) 3.5 - 5.1 mmol/L   Calcium , Ion 1.17 1.15 - 1.40 mmol/L   HCT 37.0 (L) 39.0 - 52.0 %   Hemoglobin 12.6 (L) 13.0 - 17.0 g/dL   Sample type VENOUS   Resp panel by RT-PCR (RSV, Flu A&B, Covid) Anterior Nasal Swab     Status: None   Collection Time: 03/26/23  7:10 PM   Specimen: Anterior Nasal Swab  Result Value Ref Range   SARS Coronavirus 2 by RT PCR NEGATIVE NEGATIVE   Influenza A by PCR NEGATIVE NEGATIVE   Influenza B by PCR NEGATIVE NEGATIVE    Comment: (NOTE) The Xpert Xpress SARS-CoV-2/FLU/RSV plus assay is intended as an aid in the diagnosis of influenza from Nasopharyngeal swab specimens and should not be used as a sole basis for treatment. Nasal washings and aspirates are unacceptable for Xpert Xpress SARS-CoV-2/FLU/RSV testing.  Fact Sheet for Patients: bloggercourse.com  Fact Sheet for Healthcare Providers: seriousbroker.it  This test is not yet approved or cleared by the  [place]  FDA and has been authorized for detection and/or diagnosis of SARS-CoV-2 by FDA under an Emergency Use Authorization (EUA). This EUA will remain in effect (meaning this test can be used) for the duration of the COVID-19 declaration under Section 564(b)(1) of the Act, 21 U.S.C. section 360bbb-3(b)(1), unless the authorization is terminated or revoked.     Resp Syncytial Virus by PCR NEGATIVE NEGATIVE    Comment: (NOTE) Fact Sheet for Patients: bloggercourse.com  Fact Sheet for Healthcare Providers: seriousbroker.it  This test is not yet approved or cleared by the [place]  FDA and has been authorized for detection and/or diagnosis of SARS-CoV-2 by FDA under an Emergency Use Authorization (EUA). This EUA will remain in effect (meaning this test can be used) for the duration of the COVID-19 declaration under Section 564(b)(1) of the Act, 21 U.S.C. section 360bbb-3(b)(1), unless the authorization is terminated or revoked.  Performed at [facility], [address], [place], [place] [address]   Lactic acid, plasma     Status: None   Collection Time: 03/26/23  7:21 PM  Result Value Ref Range   Lactic Acid, Venous 0.9 0.5 - 1.9 mmol/L    Comment: Performed at [facility], [address] [place], [place]  [address]   CT HEAD WO CONTRAST ( ) Result Date: 03/26/2023 CLINICAL DATA:  Mental status change, unknown cause EXAM: CT HEAD WITHOUT CONTRAST TECHNIQUE: Contiguous axial images were obtained from the base of the skull through the vertex without intravenous contrast. RADIATION DOSE REDUCTION: This exam was performed according to the departmental dose-optimization program which includes automated exposure control, adjustment of the mA and/or kV according to patient size and/or use of iterative reconstruction technique. COMPARISON:  09/17/2021 FINDINGS: Brain: Hypoattenuation in the right thalamus (series  3/image 18) is compatible with age-indeterminate infarct is new compared with 09/17/2021. No intracranial hemorrhage or mass effect. No hydrocephalus. No extra-axial fluid collection. Generalized cerebral atrophy and chronic small vessel ischemic disease. Chronic infarcts in the right basal ganglia and right periventricular white matter. Vascular: No hyperdense vessel. Intracranial arterial calcification. Skull: No fracture or focal lesion. Sinuses/Orbits: No acute finding. Similar mild mucosal thickening in the paranasal sinuses. Other: None. IMPRESSION: Age-indeterminate infarct in the right thalamus is new compared with 09/17/2021. MRI is recommended for further evaluation. These results were called by telephone at the time of interpretation on 03/26/2023 at 7:08 pm to provider [facility] , who verbally acknowledged these results. Electronically Signed   By: Norman Gatlin M.D.   On: 03/26/2023 19:10    Pending Labs Unresulted Labs (From admission, onward)     Start     Ordered   03/27/23 0500  Comprehensive metabolic panel  Tomorrow morning,   R        03/26/23 2047   03/27/23 0500  CBC  Tomorrow morning,   R        03/26/23 2047   03/26/23 2049  Hemoglobin A1c  Once,   R       Comments: To assess prior glycemic control    03/26/23 2048   03/26/23 2048  Sodium, urine, random  Once,   R        03/26/23 2047   03/26/23 2048  Creatinine, urine, random  Once,   R        03/26/23 2047   03/26/23 2048  Urinalysis, Routine w reflex microscopic -Urine, Clean Catch  Once,   R       Question:  Specimen Source  Answer:  Urine, Clean Catch   03/26/23 2047   03/26/23 1720  Urinalysis, w/ Reflex to Culture (Infection Suspected) -Urine, Clean Catch  Once,   URGENT       Question:  Specimen Source  Answer:  Urine, Clean Catch   03/26/23 1721            Vitals/Pain Today's Vitals   03/26/23 1636 03/26/23 1909  BP: (!) 160/77 (!) 175/75  Pulse: 65 64  Resp: (!) 8 12  Temp: 97.7 F  (36.5 C) 97.6 F (36.4 C)  TempSrc: Oral Oral  SpO2: 100% 100%    Isolation Precautions No active isolations  Medications Medications  0.9 %  sodium chloride  infusion (has no administration in time range)  aspirin  EC tablet 81 mg (has no administration in time range)  amLODipine  (NORVASC ) tablet 10 mg (has no administration in time range)  atorvastatin  (LIPITOR) tablet 40 mg (has no administration in time range)  carvedilol  (COREG ) tablet 6.25 mg (has no administration in time range)  DULoxetine  (CYMBALTA ) DR capsule 60 mg (has no administration in time range)  insulin  glargine-yfgn (SEMGLEE ) injection 40 Units (has no administration in time range)  levothyroxine  (SYNTHROID ) tablet 75 mcg (has no administration in time range)  clopidogrel  (PLAVIX )  tablet 75 mg (has no administration in time range)  ferrous sulfate  tablet 325 mg (has no administration in time range)  heparin  injection 5,000 Units (has no administration in time range)  sodium zirconium cyclosilicate  (LOKELMA ) packet 10 g (has no administration in time range)  calcium  gluconate 1 g/ 50 mL sodium chloride  IVPB (has no administration in time range)  insulin  aspart (novoLOG ) injection 5 Units (has no administration in time range)  sodium chloride  flush (NS) 0.9 % injection 3 mL (has no administration in time range)  sodium chloride  flush (NS) 0.9 % injection 3 mL (has no administration in time range)  0.9 %  sodium chloride  infusion (has no administration in time range)  acetaminophen  (TYLENOL ) tablet 650 mg (has no administration in time range)    Or  acetaminophen  (TYLENOL ) suppository 650 mg (has no administration in time range)  senna-docusate (Senokot-S) tablet 1 tablet (has no administration in time range)  ondansetron  (ZOFRAN ) tablet 4 mg (has no administration in time range)    Or  ondansetron  (ZOFRAN ) injection 4 mg (has no administration in time range)  hydrALAZINE  (APRESOLINE ) injection 10 mg (has no  administration in time range)  insulin  aspart (novoLOG ) injection 0-6 Units (has no administration in time range)  insulin  aspart (novoLOG ) injection 0-5 Units (has no administration in time range)  sodium chloride  0.9 % bolus 1,000 mL (1,000 mLs Intravenous New Bag/Given 03/26/23 2021)    Mobility non-ambulatory     Focused Assessments Neuro Assessment Handoff:  Swallow screen pass? Yes          Neuro Assessment: Exceptions to WDL Neuro Checks:      Has TPA been given? No If patient is a Neuro Trauma and patient is going to OR before floor call report to 4N Charge nurse: [phone] or [phone]   R Recommendations: See Admitting Provider Note  Report given to:   Additional Notes: Left sided deficits from prior CVA. Pt lethargic, alert to voice, requires asking questions repeatedly.

## 2023-03-26 NOTE — ED Notes (Signed)
Patient transported to MRI via stretcher.

## 2023-03-27 DIAGNOSIS — G9341 Metabolic encephalopathy: Secondary | ICD-10-CM | POA: Diagnosis not present

## 2023-03-27 LAB — URINALYSIS, W/ REFLEX TO CULTURE (INFECTION SUSPECTED)
Bilirubin Urine: NEGATIVE
Glucose, UA: >=500 mg/dL — AB
Ketones, ur: NEGATIVE mg/dL
Nitrite: NEGATIVE
Protein, ur: 100 mg/dL — AB
Specific Gravity, Urine: 1.013 (ref 1.005–1.030)
WBC, UA: >50 WBC/hpf (ref 0–5)
pH: 5 (ref 5.0–8.0)

## 2023-03-27 LAB — RAPID URINE DRUG SCREEN, HOSP PERFORMED
Amphetamines: NOT DETECTED
Barbiturates: NOT DETECTED
Benzodiazepines: NOT DETECTED
Cocaine: NOT DETECTED
Opiates: NOT DETECTED
Tetrahydrocannabinol: NOT DETECTED

## 2023-03-27 LAB — CBC
HCT: 38.5 % — ABNORMAL LOW (ref 39.0–52.0)
Hemoglobin: 12.2 g/dL — ABNORMAL LOW (ref 13.0–17.0)
MCH: 28.6 pg (ref 26.0–34.0)
MCHC: 31.7 g/dL (ref 30.0–36.0)
MCV: 90.4 fL (ref 80.0–100.0)
Platelets: 250 10*3/uL (ref 150–400)
RBC: 4.26 MIL/uL (ref 4.22–5.81)
RDW: 13.8 % (ref 11.5–15.5)
WBC: 9.1 10*3/uL (ref 4.0–10.5)
nRBC: 0 % (ref 0.0–0.2)

## 2023-03-27 LAB — GLUCOSE, CAPILLARY
Glucose-Capillary: 215 mg/dL — ABNORMAL HIGH (ref 70–99)
Glucose-Capillary: 192 mg/dL — ABNORMAL HIGH (ref 70–99)

## 2023-03-27 LAB — COMPREHENSIVE METABOLIC PANEL
ALT: 20 U/L (ref 0–44)
AST: 18 U/L (ref 15–41)
Albumin: 3.4 g/dL — ABNORMAL LOW (ref 3.5–5.0)
Alkaline Phosphatase: 101 U/L (ref 38–126)
Anion gap: 11 (ref 5–15)
BUN: 33 mg/dL — ABNORMAL HIGH (ref 8–23)
CO2: 20 mmol/L — ABNORMAL LOW (ref 22–32)
Calcium: 9.6 mg/dL (ref 8.9–10.3)
Chloride: 109 mmol/L (ref 98–111)
Creatinine, Ser: 1.86 mg/dL — ABNORMAL HIGH (ref 0.61–1.24)
GFR, Estimated: 39 mL/min — ABNORMAL LOW (ref >60)
Glucose, Bld: 188 mg/dL — ABNORMAL HIGH (ref 70–99)
Potassium: 5.3 mmol/L — ABNORMAL HIGH (ref 3.5–5.1)
Sodium: 140 mmol/L (ref 135–145)
Total Bilirubin: 0.9 mg/dL (ref 0.0–1.2)
Total Protein: 7.5 g/dL (ref 6.5–8.1)

## 2023-03-27 LAB — SODIUM, URINE, RANDOM: Sodium, Ur: 90 mmol/L

## 2023-03-27 LAB — CBG MONITORING, ED: Glucose-Capillary: 168 mg/dL — ABNORMAL HIGH (ref 70–99)

## 2023-03-27 LAB — CREATININE, URINE, RANDOM: Creatinine, Urine: 55 mg/dL

## 2023-03-27 MED ORDER — INSULIN GLARGINE-YFGN 100 UNIT/ML ~~LOC~~ SOLN
20.0000 [IU] | Freq: Every day | SUBCUTANEOUS | Status: DC
  Administered 2023-03-27 – 2023-03-28 (×2): 20 [IU] via SUBCUTANEOUS
  Filled 2023-03-27 (×2): qty 0.2

## 2023-03-27 MED ORDER — SODIUM ZIRCONIUM CYCLOSILICATE 10 G PO PACK
10.0000 g | PACK | Freq: Once | ORAL | Status: AC
  Administered 2023-03-27: 10 g via ORAL
  Filled 2023-03-27: qty 1

## 2023-03-27 NOTE — ED Notes (Signed)
 ED TO INPATIENT HANDOFF REPORT  ED Nurse Name and Phone #: Andriette Grafton RAMAN Name/Age/Gender Steve Alvarado Paschal 68 y.o. male Room/Bed: 003C/003C  Code Status   Code Status: Full Code  Home/SNF/Other Skilled nursing facility Patient oriented to: self, place, time, and situation Is this baseline? Yes   Triage Complete: Triage complete  Chief Complaint Acute metabolic encephalopathy [G93.41]  Triage Note Pt BIB [facility] from [facility] d/t AMS. Facility found pt this morning with AMS and lethargy, aox4 at baseline. Pt was diagnosed with PNA 2wks ago and has been taking abx. Per EMS R lower rhonci, Hx of L-sided deficits from stroke. Pt has +3 generalized pitting edema and ascites. VSS per EMS. Awake to verbal only.  Aox4 at this time.    Allergies Allergies  Allergen Reactions   Ace Inhibitors Cough    Reaction not listed on MAR    Level of Care/Admitting Diagnosis ED Disposition     ED Disposition  Admit   Condition  --   Comment  Hospital Area: [facility] [[id number]]  Level of Care: Telemetry Medical [104]  May admit patient to [facility] or [facility] if equivalent level of care is available:: No  Covid Evaluation: Asymptomatic - no recent exposure (last 10 days) testing not required  Diagnosis: Acute metabolic encephalopathy [8430220]  Admitting Physician: SUNDIL, SUBRINA [[id number]]  Attending Physician: SUNDIL, SUBRINA [[id number]]  Certification:: I certify this patient will need inpatient services for at least 2 midnights  Expected Medical Readiness: 03/31/2023          B Medical/Surgery History Past Medical History:  Diagnosis Date   Acute kidney injury (HCC) 08/20/2021   Basal ganglia stroke (HCC)    a. 07/2021 L sided wkns/facial droop/fall-->MRI brain: nonhemorrhagic infarct of posterior R lentiform nucleus and corona radiata measure. Remote lacunar infarcts of post cerebellum bilat and L thalamus.   CHF (congestive heart failure) (HCC)     CKD (chronic kidney disease), stage II    a. 08/2021 AKI w/ creat up to 3.4.   Cocaine use    Coronary artery disease    a. 07/2010 PCI: LAD 61m, LCX 80 diff/small, OM1 41m, RCA 95m (3.0x18 Vision BMS). EF 50%; b. 07/2013 Cath: LM nl, LAD 30p, 20d, LCX 20d, LCX 100, OM1 60, RCA 30p, 23m, RPDA 90-->Med Rx; c. 10/2020 MV: EF 46% (60-65% by echo), no ischemia. 3V cor Ca2+.   Depression    Diabetes mellitus    Type II   Diastolic dysfunction    a. 10/2020 Echo: EF 60-65%, no rwma, mild LVH, GrI DD, nl RV size/fxn; b. 07/2021 Echo: EF 60-65%, no rwma, GrI DD, nl RV fxn, mildly dil LA, triv MR; c. 10/2021 Echo: EF 55-60%, no rwma, GrI DD, nl RV fxn, mild-mod MR, Asc Ao 37mm.   Hyperlipidemia    Hypertension    Hypothyroidism    Past Surgical History:  Procedure Laterality Date   CARDIAC CATHETERIZATION  08/06/2010   Bare metal stent placed in RCA.   HAND SURGERY     left     A IV Location/Drains/Wounds Patient Lines/Drains/Airways Status     Active Line/Drains/Airways     Name Placement date Placement time Site Days   Peripheral IV 03/26/23 18 G Right Antecubital 03/26/23  1841  Antecubital  1   Urethral Catheter Whitney Eell RN Straight-tip 16 Fr. 03/26/23  2358  Straight-tip  1   External Urinary Catheter 08/28/22  1216  --  211  Intake/Output Last 24 hours  Intake/Output Summary (Last 24 hours) at 03/27/2023 1254 Last data filed at 03/26/2023 2048 Gross per 24 hour  Intake 150 ml  Output --  Net 150 ml    Labs/Imaging Results for orders placed or performed during the hospital encounter of 03/26/23 (from the past 48 hours)  CBC with Differential     Status: Abnormal   Collection Time: 03/26/23  6:10 PM  Result Value Ref Range   WBC 8.0 4.0 - 10.5 K/uL   RBC 4.34 4.22 - 5.81 MIL/uL   Hemoglobin 12.5 (L) 13.0 - 17.0 g/dL   HCT 60.0 60.9 - 47.9 %   MCV 91.9 80.0 - 100.0 fL   MCH 28.8 26.0 - 34.0 pg   MCHC 31.3 30.0 - 36.0 g/dL   RDW 86.3 88.4 - 84.4 %    Platelets 248 150 - 400 K/uL   nRBC 0.0 0.0 - 0.2 %   Neutrophils Relative % 59 %   Neutro Abs 4.8 1.7 - 7.7 K/uL   Lymphocytes Relative 25 %   Lymphs Abs 2.0 0.7 - 4.0 K/uL   Monocytes Relative 9 %   Monocytes Absolute 0.7 0.1 - 1.0 K/uL   Eosinophils Relative 6 %   Eosinophils Absolute 0.4 0.0 - 0.5 K/uL   Basophils Relative 0 %   Basophils Absolute 0.0 0.0 - 0.1 K/uL   Immature Granulocytes 1 %   Abs Immature Granulocytes 0.04 0.00 - 0.07 K/uL    Comment: Performed at [facility], [address] [place], [place] [address]  Comprehensive metabolic panel     Status: Abnormal   Collection Time: 03/26/23  6:10 PM  Result Value Ref Range   Sodium 138 135 - 145 mmol/L   Potassium 5.8 (H) 3.5 - 5.1 mmol/L   Chloride 108 98 - 111 mmol/L   CO2 22 22 - 32 mmol/L   Glucose, Bld 216 (H) 70 - 99 mg/dL    Comment: Glucose reference range applies only to samples taken after fasting for at least 8 hours.   BUN 40 (H) 8 - 23 mg/dL   Creatinine, Ser 7.72 (H) 0.61 - 1.24 mg/dL   Calcium  9.4 8.9 - 10.3 mg/dL   Total Protein 7.5 6.5 - 8.1 g/dL   Albumin 3.4 (L) 3.5 - 5.0 g/dL   AST 16 15 - 41 U/L   ALT 21 0 - 44 U/L   Alkaline Phosphatase 99 38 - 126 U/L   Total Bilirubin 0.7 0.0 - 1.2 mg/dL   GFR, Estimated 31 (L) >60 mL/min    Comment: (NOTE) Calculated using the CKD-EPI Creatinine Equation (2021)    Anion gap 8 5 - 15    Comment: Performed at [facility], [address], [place], [place] [address]  TSH     Status: None   Collection Time: 03/26/23  6:10 PM  Result Value Ref Range   TSH 2.180 0.350 - 4.500 uIU/mL    Comment: Performed by a 3rd Generation assay with a functional sensitivity of <=0.01 uIU/mL. Performed at [facility], [address], [place], [place] [address]   Lactic acid, plasma     Status: None   Collection Time: 03/26/23  6:10 PM  Result Value Ref Range   Lactic Acid, Venous 0.9 0.5 - 1.9 mmol/L    Comment: Performed at [facility]  Lab, [address], [place], [place] [address]  Ammonia     Status: None  Collection Time: 03/26/23  6:10 PM  Result Value Ref Range   Ammonia 17 9 - 35 umol/L    Comment: Performed at [facility], [address], [place], [place] [address]  Brain natriuretic peptide     Status: None   Collection Time: 03/26/23  6:10 PM  Result Value Ref Range   B Natriuretic Peptide 63.8 0.0 - 100.0 pg/mL    Comment: Performed at [facility], [address] [place], [place] [address]  Procalcitonin     Status: None   Collection Time: 03/26/23  6:10 PM  Result Value Ref Range   Procalcitonin <0.10 ng/mL    Comment:        Interpretation: PCT (Procalcitonin) <= 0.5 ng/mL: Systemic infection (sepsis) is not likely. Local bacterial infection is possible. (NOTE)       Sepsis PCT Algorithm           Lower Respiratory Tract                                      Infection PCT Algorithm    ----------------------------     ----------------------------         PCT < 0.25 ng/mL                PCT < 0.10 ng/mL          Strongly encourage             Strongly discourage   discontinuation of antibiotics    initiation of antibiotics    ----------------------------     -----------------------------       PCT 0.25 - 0.50 ng/mL            PCT 0.10 - 0.25 ng/mL               OR       >80% decrease in PCT            Discourage initiation of                                            antibiotics      Encourage discontinuation           of antibiotics    ----------------------------     -----------------------------         PCT >= 0.50 ng/mL              PCT 0.26 - 0.50 ng/mL               AND        <80% decrease in PCT             Encourage initiation of                                             antibiotics       Encourage continuation           of antibiotics    ----------------------------     -----------------------------        PCT >= 0.50 ng/mL                  PCT >  0.50 ng/mL                AND         increase in PCT                  Strongly encourage                                      initiation of antibiotics    Strongly encourage escalation           of antibiotics                                     -----------------------------                                           PCT <= 0.25 ng/mL                                                 OR                                        > 80% decrease in PCT                                      Discontinue / Do not initiate                                             antibiotics  Performed at [facility], [address] [place], [place] [address]   POC CBG, ED     Status: Abnormal   Collection Time: 03/26/23  6:14 PM  Result Value Ref Range   Glucose-Capillary 229 (H) 70 - 99 mg/dL    Comment: Glucose reference range applies only to samples taken after fasting for at least 8 hours.  I-Stat venous blood gas, ([facility], [facility], [facility])     Status: Abnormal   Collection Time: 03/26/23  6:46 PM  Result Value Ref Range   pH, Ven 7.353 7.25 - 7.43   pCO2, Ven 43.3 (L) 44 - 60 mmHg   pO2, Ven 125 (H) 32 - 45 mmHg   Bicarbonate 24.1 20.0 - 28.0 mmol/L   TCO2 25 22 - 32 mmol/L   O2 Saturation 99 %   Acid-base deficit 2.0 0.0 - 2.0 mmol/L   Sodium 140 135 - 145 mmol/L   Potassium 5.8 (H) 3.5 - 5.1 mmol/L   Calcium , Ion 1.17 1.15 - 1.40 mmol/L   HCT 37.0 (L) 39.0 - 52.0 %   Hemoglobin 12.6 (L) 13.0 - 17.0 g/dL   Sample type VENOUS   Resp panel by RT-PCR (RSV, Flu A&B, Covid) Anterior Nasal Swab     Status: None   Collection Time: 03/26/23  7:10 PM  Specimen: Anterior Nasal Swab  Result Value Ref Range   SARS Coronavirus 2 by RT PCR NEGATIVE NEGATIVE   Influenza A by PCR NEGATIVE NEGATIVE   Influenza B by PCR NEGATIVE NEGATIVE    Comment: (NOTE) The Xpert Xpress SARS-CoV-2/FLU/RSV plus assay is intended as an aid in the diagnosis of influenza from Nasopharyngeal swab specimens and should not be used as a sole basis for  treatment. Nasal washings and aspirates are unacceptable for Xpert Xpress SARS-CoV-2/FLU/RSV testing.  Fact Sheet for Patients: bloggercourse.com  Fact Sheet for Healthcare Providers: seriousbroker.it  This test is not yet approved or cleared by the [place]  FDA and has been authorized for detection and/or diagnosis of SARS-CoV-2 by FDA under an Emergency Use Authorization (EUA). This EUA will remain in effect (meaning this test can be used) for the duration of the COVID-19 declaration under Section 564(b)(1) of the Act, 21 U.S.C. section 360bbb-3(b)(1), unless the authorization is terminated or revoked.     Resp Syncytial Virus by PCR NEGATIVE NEGATIVE    Comment: (NOTE) Fact Sheet for Patients: bloggercourse.com  Fact Sheet for Healthcare Providers: seriousbroker.it  This test is not yet approved or cleared by the [place]  FDA and has been authorized for detection and/or diagnosis of SARS-CoV-2 by FDA under an Emergency Use Authorization (EUA). This EUA will remain in effect (meaning this test can be used) for the duration of the COVID-19 declaration under Section 564(b)(1) of the Act, 21 U.S.C. section 360bbb-3(b)(1), unless the authorization is terminated or revoked.  Performed at [facility], [address], [place], [place] [address]   Lactic acid, plasma     Status: None   Collection Time: 03/26/23  7:21 PM  Result Value Ref Range   Lactic Acid, Venous 0.9 0.5 - 1.9 mmol/L    Comment: Performed at [facility], [address], [place], [place] [address]  CBG monitoring, ED     Status: Abnormal   Collection Time: 03/26/23 10:47 PM  Result Value Ref Range   Glucose-Capillary 180 (H) 70 - 99 mg/dL    Comment: Glucose reference range applies only to samples taken after fasting for at least 8 hours.  Urinalysis, w/ Reflex to Culture (Infection  Suspected) -Urine, Clean Catch     Status: Abnormal   Collection Time: 03/26/23 11:58 PM  Result Value Ref Range   Specimen Source URINE, CLEAN CATCH    Color, Urine YELLOW YELLOW   APPearance TURBID (A) CLEAR   Specific Gravity, Urine 1.013 1.005 - 1.030   pH 5.0 5.0 - 8.0   Glucose, UA >=500 (A) NEGATIVE mg/dL   Hgb urine dipstick SMALL (A) NEGATIVE   Bilirubin Urine NEGATIVE NEGATIVE   Ketones, ur NEGATIVE NEGATIVE mg/dL   Protein, ur 899 (A) NEGATIVE mg/dL   Nitrite NEGATIVE NEGATIVE   Leukocytes,Ua MODERATE (A) NEGATIVE   RBC / HPF 6-10 0 - 5 RBC/hpf   WBC, UA >50 0 - 5 WBC/hpf    Comment:        Reflex urine culture not performed if WBC <=10, OR if Squamous epithelial cells >5. If Squamous epithelial cells >5 suggest recollection.    Bacteria, UA MANY (A) NONE SEEN   Squamous Epithelial / HPF 0-5 0 - 5 /HPF   WBC Clumps PRESENT    Hyphae Yeast PRESENT     Comment: Performed at [facility], [address], [place], [place] [address]  Sodium, urine, random     Status: None  Collection Time: 03/26/23 11:58 PM  Result Value Ref Range   Sodium, Ur 90 mmol/L    Comment: Performed at [facility], [address] [place], [place] [address]  Creatinine, urine, random     Status: None   Collection Time: 03/26/23 11:58 PM  Result Value Ref Range   Creatinine, Urine 55 mg/dL    Comment: Performed at [facility], [address], [place], [place] [address]  Comprehensive metabolic panel     Status: Abnormal   Collection Time: 03/27/23  3:36 AM  Result Value Ref Range   Sodium 140 135 - 145 mmol/L   Potassium 5.3 (H) 3.5 - 5.1 mmol/L   Chloride 109 98 - 111 mmol/L   CO2 20 (L) 22 - 32 mmol/L   Glucose, Bld 188 (H) 70 - 99 mg/dL    Comment: Glucose reference range applies only to samples taken after fasting for at least 8 hours.   BUN 33 (H) 8 - 23 mg/dL   Creatinine, Ser 8.13 (H) 0.61 - 1.24 mg/dL   Calcium  9.6 8.9 - 10.3 mg/dL   Total Protein 7.5 6.5 -  8.1 g/dL   Albumin 3.4 (L) 3.5 - 5.0 g/dL   AST 18 15 - 41 U/L   ALT 20 0 - 44 U/L   Alkaline Phosphatase 101 38 - 126 U/L   Total Bilirubin 0.9 0.0 - 1.2 mg/dL   GFR, Estimated 39 (L) >60 mL/min    Comment: (NOTE) Calculated using the CKD-EPI Creatinine Equation (2021)    Anion gap 11 5 - 15    Comment: Performed at [facility], [address] [place], [place] [address]  CBC     Status: Abnormal   Collection Time: 03/27/23  3:36 AM  Result Value Ref Range   WBC 9.1 4.0 - 10.5 K/uL   RBC 4.26 4.22 - 5.81 MIL/uL   Hemoglobin 12.2 (L) 13.0 - 17.0 g/dL   HCT 61.4 (L) 60.9 - 47.9 %   MCV 90.4 80.0 - 100.0 fL   MCH 28.6 26.0 - 34.0 pg   MCHC 31.7 30.0 - 36.0 g/dL   RDW 86.1 88.4 - 84.4 %   Platelets 250 150 - 400 K/uL   nRBC 0.0 0.0 - 0.2 %    Comment: Performed at [facility], [address] [place], [place] [address]  CBG monitoring, ED     Status: Abnormal   Collection Time: 03/27/23  7:42 AM  Result Value Ref Range   Glucose-Capillary 168 (H) 70 - 99 mg/dL    Comment: Glucose reference range applies only to samples taken after fasting for at least 8 hours.   Comment 1 Notify RN    Comment 2 Document in Chart    US  RENAL Result Date: 03/26/2023 CLINICAL DATA:  Acute renal injury EXAM: RENAL / URINARY TRACT ULTRASOUND COMPLETE COMPARISON:  None Available. FINDINGS: Right Kidney: Renal measurements: 9.6 x 5.6 x 5.4 cm. = volume: 154 mL. Echogenicity within normal limits. No mass or hydronephrosis visualized. Left Kidney: Renal measurements: 9.8 x 5.6 x 5.6 cm. = volume: 159 mL. Echogenicity within normal limits. No mass or hydronephrosis visualized. Bladder: Appears normal for degree of bladder distention. Other: None. IMPRESSION: Normal appearing kidneys bilaterally. Electronically Signed   By: Oneil Devonshire M.D.   On: 03/26/2023 22:16   MR BRAIN WO CONTRAST Result Date: 03/26/2023 CLINICAL DATA:  Mental status change, unknown cause altered mental status EXAM: MRI HEAD  WITHOUT CONTRAST TECHNIQUE: Multiplanar, multiecho pulse sequences of the brain and surrounding structures were obtained without intravenous contrast. COMPARISON:  MRI August 20, 2021.  CT head from today. FINDINGS: Brain: Remote right subinsular and thalamic infarct with wallerian degenerative change extending into the descending white matter tracks and right midbrain. No acute infarct, acute hemorrhage, mass lesion or midline shift. No hydrocephalus. Remote infarcts in the cerebellum bilaterally. Cerebral atrophy. Vascular: Remote infarct in the right basal ganglia with wallerian degenerative change extending into the right midbrain. Skull and upper cervical spine: Normal marrow signal. Sinuses/Orbits: Mostly clear sinuses.  No acute orbital findings. Other: No sizable mastoid effusions. IMPRESSION: 1. No evidence of acute intracranial abnormality. 2. Remote right subinsular and thalamic infarct with wallerian degenerative change. Electronically Signed   By: Gilmore GORMAN Molt M.D.   On: 03/26/2023 21:42   DG Chest Portable 1 View Result Date: 03/26/2023 CLINICAL DATA:  Recent pneumonia and altered mental status EXAM: PORTABLE CHEST 1 VIEW COMPARISON:  07/25/2022 FINDINGS: Low lung volumes. Stable cardiomediastinal silhouette. Pulmonary vascular congestion. Right basilar atelectasis or pneumonia. No definite pleural effusion. No pneumothorax. IMPRESSION: Low lung volumes with right basilar atelectasis or pneumonia. Follow-up in 6-8 weeks is recommended to ensure resolution. Electronically Signed   By: Norman Gatlin M.D.   On: 03/26/2023 21:28   CT HEAD WO CONTRAST ( ) Result Date: 03/26/2023 CLINICAL DATA:  Mental status change, unknown cause EXAM: CT HEAD WITHOUT CONTRAST TECHNIQUE: Contiguous axial images were obtained from the base of the skull through the vertex without intravenous contrast. RADIATION DOSE REDUCTION: This exam was performed according to the departmental dose-optimization program which  includes automated exposure control, adjustment of the mA and/or kV according to patient size and/or use of iterative reconstruction technique. COMPARISON:  09/17/2021 FINDINGS: Brain: Hypoattenuation in the right thalamus (series 3/image 18) is compatible with age-indeterminate infarct is new compared with 09/17/2021. No intracranial hemorrhage or mass effect. No hydrocephalus. No extra-axial fluid collection. Generalized cerebral atrophy and chronic small vessel ischemic disease. Chronic infarcts in the right basal ganglia and right periventricular white matter. Vascular: No hyperdense vessel. Intracranial arterial calcification. Skull: No fracture or focal lesion. Sinuses/Orbits: No acute finding. Similar mild mucosal thickening in the paranasal sinuses. Other: None. IMPRESSION: Age-indeterminate infarct in the right thalamus is new compared with 09/17/2021. MRI is recommended for further evaluation. These results were called by telephone at the time of interpretation on 03/26/2023 at 7:08 pm to provider [facility] , who verbally acknowledged these results. Electronically Signed   By: Norman Gatlin M.D.   On: 03/26/2023 19:10    Pending Labs Unresulted Labs (From admission, onward)     Start     Ordered   03/28/23 0500  CBC  Tomorrow morning,   R        03/27/23 1046   03/28/23 0500  Basic metabolic panel  Tomorrow morning,   R        03/27/23 1046   03/26/23 2358  Urine Culture  Once,   R        03/26/23 2358   03/26/23 2049  Hemoglobin A1c  Once,   R       Comments: To assess prior glycemic control    03/26/23 2048            Vitals/Pain Today's Vitals   03/27/23 1030 03/27/23 1045 03/27/23 1145 03/27/23 1245  BP: (!) 150/82 (!) 156/86 (!) 161/85 (!) 161/74  Pulse:    69  Resp: 11 13 12  13  Temp:      TempSrc:      SpO2:    100%  PainSc:        Isolation Precautions No active isolations  Medications Medications  aspirin  EC tablet 81 mg (81 mg Oral Given 03/27/23  0900)  amLODipine  (NORVASC ) tablet 10 mg (10 mg Oral Given 03/27/23 0900)  atorvastatin  (LIPITOR) tablet 40 mg (40 mg Oral Given 03/26/23 2234)  DULoxetine  (CYMBALTA ) DR capsule 60 mg (60 mg Oral Given 03/27/23 0859)  levothyroxine  (SYNTHROID ) tablet 75 mcg (75 mcg Oral Given 03/26/23 2234)  clopidogrel  (PLAVIX ) tablet 75 mg (75 mg Oral Given 03/27/23 0859)  ferrous sulfate  tablet 325 mg (325 mg Oral Given 03/27/23 0859)  heparin  injection 5,000 Units (5,000 Units Subcutaneous Given 03/27/23 0555)  sodium chloride  flush (NS) 0.9 % injection 3 mL (3 mLs Intravenous Given 03/26/23 2235)  sodium chloride  flush (NS) 0.9 % injection 3 mL (has no administration in time range)  0.9 %  sodium chloride  infusion (has no administration in time range)  acetaminophen  (TYLENOL ) tablet 650 mg (has no administration in time range)    Or  acetaminophen  (TYLENOL ) suppository 650 mg (has no administration in time range)  senna-docusate (Senokot-S) tablet 1 tablet (has no administration in time range)  ondansetron  (ZOFRAN ) tablet 4 mg (has no administration in time range)    Or  ondansetron  (ZOFRAN ) injection 4 mg (has no administration in time range)  hydrALAZINE  (APRESOLINE ) injection 10 mg (has no administration in time range)  insulin  aspart (novoLOG ) injection 0-6 Units ( Subcutaneous Not Given 03/27/23 0924)  insulin  aspart (novoLOG ) injection 0-5 Units (0 Units Subcutaneous Not Given 03/26/23 2248)  carvedilol  (COREG ) tablet 3.125 mg (3.125 mg Oral Given 03/27/23 0859)  albuterol  (PROVENTIL ) (2.5 MG/3ML) 0.083% nebulizer solution 2.5 mg (has no administration in time range)  insulin  glargine-yfgn (SEMGLEE ) injection 20 Units (20 Units Subcutaneous Given 03/27/23 1153)  sodium chloride  0.9 % bolus 1,000 mL (0 mLs Intravenous Stopped 03/26/23 2353)  sodium zirconium cyclosilicate  (LOKELMA ) packet 10 g (10 g Oral Given 03/26/23 2235)  calcium  gluconate 1 g/ 50 mL sodium chloride  IVPB (0 mg Intravenous Stopped 03/26/23 2350)  insulin   aspart (novoLOG ) injection 5 Units (5 Units Subcutaneous Given 03/26/23 2248)  sodium zirconium cyclosilicate  (LOKELMA ) packet 10 g (10 g Oral Given 03/27/23 0859)    Mobility non-ambulatory     Focused Assessments   R Recommendations: See Admitting Provider Note  Report given to:   Additional Notes:  Pt requires assistance to eat and drink. A&Ox4

## 2023-03-27 NOTE — Progress Notes (Signed)
 [facility] PROGRESS NOTE  Steve Alvarado (DOB: 06/30/1955) FMW:[id number] PCP: Celinda Chrystal HERO, MD  Brief Narrative: Steve Alvarado is a 68 y.o. male with a history of CAD s/p PCI, CVA w/left hemiparesis, HFpEF, HTN, IDT2DM, hypothyroidism, history of polysubstance use, UTI's who presented to the ED on 03/26/2023 from long term care facility with lethargy/altered mental status. He had been treated with antibiotics for pneumonia recently. Neuroimaging showed new stroke from 2023 that was not acute. Creatinine was noted to be elevated to 2.27 from baseline of 1.1-1.3. He was persistently lethargic, so admitted for acute metabolic encephalopathy, sedating medications held, IVF given with improvement in creatinine to 1.86. Mental status improving.  Subjective: Says he hopes I can work a miracle, and that they gave me something that made him so lethargic. He has otherwise been feeling well. He hasn't eaten much, no appetite recently or currently. No other complaints, still in ED room awaiting bed upstairs.   Objective: BP (!) 160/86   Pulse 71   Temp 98.5 F (36.9 C)   Resp 17   SpO2 100%   Gen: Pleasant male in no distress Pulm: Clear, nonlabored  CV: RRR, no MRG, 1+ symmetric LE edema GI: Soft, NT, ND, +BS Neuro: Alert and interactive, oriented to time, person, birthday, and hospital but wasn't sure which one. Stable L side deficits confirmed on exam per pt. Ext: Warm, dry Skin: No open ulcers on visualized skin   Assessment & Plan: Acute metabolic encephalopathy: Primary suspicion is polypharmacy in setting of AKI. Neuroimaging nonacute, TSH, VBG, ammonia all unremarkable.  - Holding baclofen , lyrica , trazodone , supporting renal function (improving) and mentation is improving, though not a definite baseline yet.  - Given hx UTI and significant urine findings w/AMS. If mentation does not improve would consider ceftriaxone  for UTI pending urine culture.   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 No casts but UA with bacteriuria and pyuria, 6-10 RBCs/HPF as well. Pt has frequent UTIs. US  renal unremarkable.  - Continue serial BMP, strict I/O, weights.  - Holding entresto , repeating lokelma , will remain on telemetry.   IDT2DM:  - Continue glargine but will half dose to 20u until his oral intake is confirmed, continue sensitive SSI with AKI.   Chronic HFpEF, HTN: Leg edema is at pt's reported baseline. LVEF on last echo 55-60%.  - Continue coreg , decreased dose due to borderline bradycardia.  - Continue norvasc  - Hold lasix , entresto , SGLT2i due to AKI as above. Monitor volume status clinically. BNP 63.  History of CVA with persistent left side hemiparesis/deficits, spasticity: These are stable. Head CT showed right thalamic stroke of indeterminate age, that is new from 2023 confirmed not to be acute, with evidence of Wallerian degeneration on MRI - Continue aspirin , plavix , atorvastatin .  - Holding baclofen  w/AMS and AKI. Consider restart once back to baseline  CAD s/p PCI: No anginal complaints.  - Continue DAPT, statin, beta blocker  Depression: Quiescent.  - Continue holding trazodone  while encephalopathic.   Hypothyroidism: TSH 2.180 this admit.  - Continue synthroid .  Recent pneumonia: CXR w/right basilar opacity. No fever, leukocytosis, or hypoxemia/respiratory distress. PCT undetectable. Covid, flu, RSV PCR's negative.  - Has completed abx, will suggest repeat CXR in 6-8 weeks.    Bernardino KATHEE Come, MD [facility] [email or web address] 03/27/2023, 10:24 AM

## 2023-03-27 NOTE — Plan of Care (Signed)

## 2023-03-28 DIAGNOSIS — G9341 Metabolic encephalopathy: Secondary | ICD-10-CM | POA: Diagnosis not present

## 2023-03-28 LAB — BASIC METABOLIC PANEL
Anion gap: 9 (ref 5–15)
BUN: 27 mg/dL — ABNORMAL HIGH (ref 8–23)
CO2: 23 mmol/L (ref 22–32)
Calcium: 9 mg/dL (ref 8.9–10.3)
Chloride: 106 mmol/L (ref 98–111)
Creatinine, Ser: 1.5 mg/dL — ABNORMAL HIGH (ref 0.61–1.24)
GFR, Estimated: 50 mL/min — ABNORMAL LOW (ref >60)
Glucose, Bld: 146 mg/dL — ABNORMAL HIGH (ref 70–99)
Potassium: 4.7 mmol/L (ref 3.5–5.1)
Sodium: 138 mmol/L (ref 135–145)

## 2023-03-28 LAB — GLUCOSE, CAPILLARY
Glucose-Capillary: 137 mg/dL — ABNORMAL HIGH (ref 70–99)
Glucose-Capillary: 234 mg/dL — ABNORMAL HIGH (ref 70–99)

## 2023-03-28 LAB — HEMOGLOBIN A1C
Hgb A1c MFr Bld: 10.2 % — ABNORMAL HIGH (ref 4.8–5.6)
Mean Plasma Glucose: 246 mg/dL

## 2023-03-28 LAB — CBC
HCT: 33.9 % — ABNORMAL LOW (ref 39.0–52.0)
Hemoglobin: 11.1 g/dL — ABNORMAL LOW (ref 13.0–17.0)
MCH: 29.1 pg (ref 26.0–34.0)
MCHC: 32.7 g/dL (ref 30.0–36.0)
MCV: 89 fL (ref 80.0–100.0)
Platelets: 238 10*3/uL (ref 150–400)
RBC: 3.81 MIL/uL — ABNORMAL LOW (ref 4.22–5.81)
RDW: 13.5 % (ref 11.5–15.5)
WBC: 6.8 10*3/uL (ref 4.0–10.5)
nRBC: 0 % (ref 0.0–0.2)

## 2023-03-28 MED ORDER — CHLORHEXIDINE GLUCONATE CLOTH 2 % EX PADS
6.0000 | MEDICATED_PAD | Freq: Every day | CUTANEOUS | Status: DC
  Administered 2023-03-27 – 2023-03-28 (×2): 6 via TOPICAL

## 2023-03-28 MED ORDER — TRAZODONE HCL 50 MG PO TABS: 50.0000 mg | ORAL_TABLET | Freq: Every evening | ORAL | Status: AC | PRN

## 2023-03-28 MED ORDER — FOSFOMYCIN TROMETHAMINE 3 G PO PACK
3.0000 g | PACK | Freq: Once | ORAL | Status: AC
  Administered 2023-03-28: 3 g via ORAL
  Filled 2023-03-28: qty 3

## 2023-03-28 NOTE — Inpatient Diabetes Management (Signed)
 Inpatient Diabetes Program Recommendations  AACE/ADA: New Consensus Statement on Inpatient Glycemic Control (2015)  Target Ranges:  Prepandial:   less than 140 mg/dL      Peak postprandial:   less than 180 mg/dL (1-2 hours)      Critically ill patients:  140 - 180 mg/dL   Lab Results  Component Value Date   GLUCAP 234 (H) 03/28/2023   HGBA1C 10.2 (H) 03/26/2023    Review of Glycemic Control  Latest Reference Range & Units 03/28/23 07:43 03/28/23 12:01  Glucose-Capillary 70 - 99 mg/dL 862 (H) 765 (H)   Diabetes history: DM  Outpatient Diabetes medications:  Farxiga 5 mg daily Lantus  40 units q HS Humalog  5-10 units tid with  meals  Current orders for Inpatient glycemic control:  Novolog  0-6 units tid with meals and HS Semglee  20 units daily  Inpatient Diabetes Program Recommendations:   Consider adding Novolog  3 units tid with meals (hold if patient eats less than 50% or NPO).   A1C is 10.2% indicating need for improved control of CBG's.  Patient is from facility.    Thanks,  Randall Bullocks, RN, BC-ADM Inpatient Diabetes Coordinator Pager [phone]  (8a-5p)

## 2023-03-28 NOTE — Discharge Summary (Signed)
 Physician Discharge Summary   Patient: Steve Alvarado MRN: [id number] DOB: Dec 28, 1955  Admit date:     03/26/2023  Discharge date: 03/28/23  Discharge Physician: Bernardino KATHEE Come   PCP: Celinda Chrystal HERO, MD   Recommendations at discharge:  Monitor renal function and mental status, recommend adding back sedating medications slowly, including trazodone , baclofen , lyrica , norco (all held currently).  Repeat CXR in 6-8 weeks  Discharge Diagnoses: Principal Problem:   Acute metabolic encephalopathy Active Problems:   Acute kidney injury superimposed on CKD (HCC)   Chronic diastolic CHF (congestive heart failure) (HCC)   Hyperkalemia   Essential hypertension   Hypothyroidism   Anemia of chronic disease   History of CAD (coronary artery disease)   Insulin  dependent type 2 diabetes mellitus (HCC)   History of depression   History of CVA (cerebrovascular accident)  Hospital Course: Acute metabolic encephalopathy: Primary suspicion is polypharmacy in setting of AKI. Neuroimaging nonacute, TSH, VBG, ammonia all unremarkable.  - Holding baclofen , lyrica , trazodone , supporting renal function (improving) and mentation has returned to baseline. - Given hx UTI and AMS, urine culture was sent and is growing GNR's. No urinary symptoms, though foley was placed on admission. No suspicion for sepsis at this time, so treating with fosfomycin x1.   - Norco (no doses listed on MAR) is not restarted at discharge.    AKI on stage II CKD with hyperkalemia: FENa elevated > 2%. No casts but UA with bacteriuria and pyuria, 6-10 RBCs/HPF as well. US  renal unremarkable. Cr has improved with IVF and continued improving after taken off IVF.  - Ok to restart home medications (e.g. entresto ) 1/8.  - Repeat BMP later this week to confirm continued improvement.    IDT2DM:  - Continue home Tx (conflicting information on patient's MAR which was not altered). HbA1c is 10%   Chronic HFpEF, HTN: Leg edema is at pt's  reported baseline. LVEF on last echo 55-60%.  - Continue coreg , decreased dose due to borderline bradycardia.  - Continue norvasc  - Hold lasix , entresto , SGLT2i due to AKI as above. Monitor volume status clinically. BNP 63.   History of CVA with persistent left side hemiparesis/deficits, spasticity: These are stable. Head CT showed right thalamic stroke of indeterminate age, that is new from 2023 confirmed not to be acute, with evidence of Wallerian degeneration on MRI - Continue aspirin , plavix , atorvastatin .  - Held baclofen  w/AMS and AKI. Suggest restart once renal function completely back to baseline.   CAD s/p PCI: No anginal complaints.  - Continue DAPT, statin, beta blocker   Depression: Quiescent.  - Continue holding trazodone  150mg , could consider 50mg  qHS if needed.   Hypothyroidism: TSH 2.180 this admit.  - Continue synthroid .   Recent pneumonia: CXR w/right basilar opacity. No fever, leukocytosis, or hypoxemia/respiratory distress. PCT undetectable. Covid, flu, RSV PCR's negative.  - Has completed abx, will suggest repeat CXR in 6-8 weeks.    Assessment and Plan: No notes have been filed under this hospital service. Service: Hospitalist        Consultants: None Procedures performed: None  Disposition: Return to SNF Diet recommendation: Heart healthy, carb-modified DISCHARGE MEDICATION: Allergies as of 03/28/2023       Reactions   Ace Inhibitors Cough   Reaction not listed on MAR        Medication List     PAUSE taking these medications    baclofen  10 MG tablet Wait to take this until: March 29, 2023 Morning Commonly known as: LIORESAL  Take  10 mg by mouth 2 (two) times daily.   pregabalin  75 MG capsule Wait to take this until: March 29, 2023 Morning Commonly known as: LYRICA  Take 75 mg by mouth 3 (three) times daily.   sacubitril -valsartan  24-26 MG Wait to take this until: March 29, 2023 Morning Commonly known as: ENTRESTO  Take 1 tablet by  mouth 2 (two) times daily.       STOP taking these medications    HYDROcodone -acetaminophen  5-325 MG tablet Commonly known as: NORCO/VICODIN       TAKE these medications    acetaminophen  500 MG tablet Commonly known as: TYLENOL  Take 500 mg by mouth 3 (three) times daily.   albuterol  108 (90 Base) MCG/ACT inhaler Commonly known as: VENTOLIN  HFA Inhale 2 puffs into the lungs every 6 (six) hours as needed for wheezing.   amLODipine  10 MG tablet Commonly known as: NORVASC  Take 1 tablet (10 mg total) by mouth every morning.   aspirin  EC 81 MG tablet Take 1 tablet (81 mg total) by mouth daily. Swallow whole.   atorvastatin  40 MG tablet Commonly known as: LIPITOR Take 1 tablet (40 mg total) by mouth every evening.   benzonatate  100 MG capsule Commonly known as: TESSALON  Take 100 mg by mouth every 6 (six) hours as needed for cough.   Calamine-Zinc  Oxide 8-8 % Lotn Apply 1 application  topically in the morning and at bedtime. Apply to scrotum topically every night and day shift for redness.   carboxymethylcellulose 0.5 % Soln Commonly known as: REFRESH PLUS Place 2 drops into both eyes every 4 (four) hours as needed (dry eyes).   carvedilol  6.25 MG tablet Commonly known as: COREG  Take 1 tablet (6.25 mg total) by mouth 2 (two) times daily with a meal.   cetirizine 10 MG tablet Commonly known as: ZYRTEC Take 10 mg by mouth at bedtime.   clopidogrel  75 MG tablet Commonly known as: PLAVIX  Take 1 tablet (75 mg total) by mouth daily.   cycloSPORINE 0.05 % ophthalmic emulsion Commonly known as: RESTASIS Place 1 drop into both eyes 2 (two) times daily.   dextromethorphan-guaiFENesin  10-100 MG/5ML liquid Commonly known as: ROBITUSSIN-DM Take 10 mLs by mouth every 6 (six) hours as needed for cough.   DULoxetine  60 MG capsule Commonly known as: CYMBALTA  Take 60 mg by mouth daily.   Farxiga 5 MG Tabs tablet Generic drug: dapagliflozin propanediol Take 5 mg by mouth  daily.   ferrous sulfate  325 (65 FE) MG EC tablet Take 1 tablet (325 mg total) by mouth every other day. What changed: when to take this   furosemide  20 MG tablet Commonly known as: Lasix  Take 1 tablet (20 mg total) by mouth every other day. Monitor volume status and change dose accordingly.   HumaLOG  KwikPen 100 UNIT/ML KwikPen Generic drug: insulin  lispro Inject 5-10 Units into the skin See admin instructions. Inject 10 units subcutaneously three times a day for diabetes. Inject 5 units subcutaneously as needed for diabetes, give additional 5 units with meals if blood sugar above 300.   insulin  aspart 100 UNIT/ML injection Commonly known as: novoLOG  Inject 5 Units into the skin 3 (three) times daily with meals.   insulin  aspart 100 UNIT/ML injection Commonly known as: novoLOG  Inject 0-9 Units into the skin 3 (three) times daily with meals.   Lantus  SoloStar 100 UNIT/ML Solostar Pen Generic drug: insulin  glargine Inject 24 Units into the skin at bedtime. What changed: how much to take   levothyroxine  75 MCG tablet Commonly known as:  SYNTHROID  Take 75 mcg by mouth at bedtime. 2200   melatonin 5 MG Tabs Take 5 mg by mouth at bedtime.   multivitamin with minerals Tabs tablet Take 1 tablet by mouth daily.   polyethylene glycol 17 g packet Commonly known as: MIRALAX  / GLYCOLAX  Take 17 g by mouth daily as needed for mild constipation.   senna-docusate 8.6-50 MG tablet Commonly known as: Senokot-S Take 1 tablet by mouth 2 (two) times daily. Hold if having loose or frequent stools.   traZODone  50 MG tablet Commonly known as: DESYREL  Take 1 tablet (50 mg total) by mouth at bedtime as needed for sleep. What changed:  medication strength how much to take when to take this reasons to take this        Follow-up Information     Celinda Chrystal HERO, MD Follow up.   Specialty: Family Medicine Contact information: [address] [place] [address] [phone]                 Discharge Exam: Fredricka Weights   03/28/23 0500  Weight: 108.5 kg  BP (!) 149/74 (BP Location: Right Arm)   Pulse 70   Temp 99.3 F (37.4 C) (Oral)   Resp 17   Wt 108.5 kg   SpO2 100%   BMI 30.71 kg/m   No distress speaking on speaker phone clearly, has normal mentation, lively and interactive and funny.  Clear, nonlabored RRR, no MRG  Condition at discharge: stable  The results of significant diagnostics from this hospitalization (including imaging, microbiology, ancillary and laboratory) are listed below for reference.   Imaging Studies: US  RENAL Result Date: 03/26/2023 CLINICAL DATA:  Acute renal injury EXAM: RENAL / URINARY TRACT ULTRASOUND COMPLETE COMPARISON:  None Available. FINDINGS: Right Kidney: Renal measurements: 9.6 x 5.6 x 5.4 cm. = volume: 154 mL. Echogenicity within normal limits. No mass or hydronephrosis visualized. Left Kidney: Renal measurements: 9.8 x 5.6 x 5.6 cm. = volume: 159 mL. Echogenicity within normal limits. No mass or hydronephrosis visualized. Bladder: Appears normal for degree of bladder distention. Other: None. IMPRESSION: Normal appearing kidneys bilaterally. Electronically Signed   By: Oneil Devonshire M.D.   On: 03/26/2023 22:16   MR BRAIN WO CONTRAST Result Date: 03/26/2023 CLINICAL DATA:  Mental status change, unknown cause altered mental status EXAM: MRI HEAD WITHOUT CONTRAST TECHNIQUE: Multiplanar, multiecho pulse sequences of the brain and surrounding structures were obtained without intravenous contrast. COMPARISON:  MRI August 20, 2021.  CT head from today. FINDINGS: Brain: Remote right subinsular and thalamic infarct with wallerian degenerative change extending into the descending white matter tracks and right midbrain. No acute infarct, acute hemorrhage, mass lesion or midline shift. No hydrocephalus. Remote infarcts in the cerebellum bilaterally. Cerebral atrophy. Vascular: Remote infarct in the right basal ganglia with wallerian  degenerative change extending into the right midbrain. Skull and upper cervical spine: Normal marrow signal. Sinuses/Orbits: Mostly clear sinuses.  No acute orbital findings. Other: No sizable mastoid effusions. IMPRESSION: 1. No evidence of acute intracranial abnormality. 2. Remote right subinsular and thalamic infarct with wallerian degenerative change. Electronically Signed   By: Gilmore GORMAN Molt M.D.   On: 03/26/2023 21:42   DG Chest Portable 1 View Result Date: 03/26/2023 CLINICAL DATA:  Recent pneumonia and altered mental status EXAM: PORTABLE CHEST 1 VIEW COMPARISON:  07/25/2022 FINDINGS: Low lung volumes. Stable cardiomediastinal silhouette. Pulmonary vascular congestion. Right basilar atelectasis or pneumonia. No definite pleural effusion. No pneumothorax. IMPRESSION: Low lung volumes with right basilar atelectasis or  pneumonia. Follow-up in 6-8 weeks is recommended to ensure resolution. Electronically Signed   By: Norman Gatlin M.D.   On: 03/26/2023 21:28   CT HEAD WO CONTRAST ( ) Result Date: 03/26/2023 CLINICAL DATA:  Mental status change, unknown cause EXAM: CT HEAD WITHOUT CONTRAST TECHNIQUE: Contiguous axial images were obtained from the base of the skull through the vertex without intravenous contrast. RADIATION DOSE REDUCTION: This exam was performed according to the departmental dose-optimization program which includes automated exposure control, adjustment of the mA and/or kV according to patient size and/or use of iterative reconstruction technique. COMPARISON:  09/17/2021 FINDINGS: Brain: Hypoattenuation in the right thalamus (series 3/image 18) is compatible with age-indeterminate infarct is new compared with 09/17/2021. No intracranial hemorrhage or mass effect. No hydrocephalus. No extra-axial fluid collection. Generalized cerebral atrophy and chronic small vessel ischemic disease. Chronic infarcts in the right basal ganglia and right periventricular white matter. Vascular: No  hyperdense vessel. Intracranial arterial calcification. Skull: No fracture or focal lesion. Sinuses/Orbits: No acute finding. Similar mild mucosal thickening in the paranasal sinuses. Other: None. IMPRESSION: Age-indeterminate infarct in the right thalamus is new compared with 09/17/2021. MRI is recommended for further evaluation. These results were called by telephone at the time of interpretation on 03/26/2023 at 7:08 pm to provider [facility] , who verbally acknowledged these results. Electronically Signed   By: Norman Gatlin M.D.   On: 03/26/2023 19:10    Microbiology: Results for orders placed or performed during the hospital encounter of 03/26/23  Resp panel by RT-PCR (RSV, Flu A&B, Covid) Anterior Nasal Swab     Status: None   Collection Time: 03/26/23  7:10 PM   Specimen: Anterior Nasal Swab  Result Value Ref Range Status   SARS Coronavirus 2 by RT PCR NEGATIVE NEGATIVE Final   Influenza A by PCR NEGATIVE NEGATIVE Final   Influenza B by PCR NEGATIVE NEGATIVE Final    Comment: (NOTE) The Xpert Xpress SARS-CoV-2/FLU/RSV plus assay is intended as an aid in the diagnosis of influenza from Nasopharyngeal swab specimens and should not be used as a sole basis for treatment. Nasal washings and aspirates are unacceptable for Xpert Xpress SARS-CoV-2/FLU/RSV testing.  Fact Sheet for Patients: bloggercourse.com  Fact Sheet for Healthcare Providers: seriousbroker.it  This test is not yet approved or cleared by the [place]  FDA and has been authorized for detection and/or diagnosis of SARS-CoV-2 by FDA under an Emergency Use Authorization (EUA). This EUA will remain in effect (meaning this test can be used) for the duration of the COVID-19 declaration under Section 564(b)(1) of the Act, 21 U.S.C. section 360bbb-3(b)(1), unless the authorization is terminated or revoked.     Resp Syncytial Virus by PCR NEGATIVE NEGATIVE Final     Comment: (NOTE) Fact Sheet for Patients: bloggercourse.com  Fact Sheet for Healthcare Providers: seriousbroker.it  This test is not yet approved or cleared by the [place]  FDA and has been authorized for detection and/or diagnosis of SARS-CoV-2 by FDA under an Emergency Use Authorization (EUA). This EUA will remain in effect (meaning this test can be used) for the duration of the COVID-19 declaration under Section 564(b)(1) of the Act, 21 U.S.C. section 360bbb-3(b)(1), unless the authorization is terminated or revoked.  Performed at [facility], [address], [place], [place] [address]   Urine Culture     Status: Abnormal (Preliminary result)   Collection Time: 03/26/23 11:58 PM   Specimen: Urine, Random  Result Value Ref Range Status   Specimen Description URINE, RANDOM  Final   Special Requests NONE Reflexed from [id number]  Final   Culture (A)  Final    >=100,000 COLONIES/mL KLEBSIELLA PNEUMONIAE >=100,000 COLONIES/mL ESCHERICHIA COLI CULTURE REINCUBATED FOR BETTER GROWTH Performed at [facility], [address], [place], [place] [address]    Report Status PENDING  Incomplete    Labs: CBC: Recent Labs  Lab 03/26/23 1810 03/26/23 1846 03/27/23 0336 03/28/23 0608  WBC 8.0  --  9.1 6.8  NEUTROABS 4.8  --   --   --   HGB 12.5* 12.6* 12.2* 11.1*  HCT 39.9 37.0* 38.5* 33.9*  MCV 91.9  --  90.4 89.0  PLT 248  --  250 238   Basic Metabolic Panel: Recent Labs  Lab 03/26/23 1810 03/26/23 1846 03/27/23 0336 03/28/23 0608  NA 138 140 140 138  K 5.8* 5.8* 5.3* 4.7  CL 108  --  109 106  CO2 22  --  20* 23  GLUCOSE 216*  --  188* 146*  BUN 40*  --  33* 27*  CREATININE 2.27*  --  1.86* 1.50*  CALCIUM  9.4  --  9.6 9.0   Liver Function Tests: Recent Labs  Lab 03/26/23 1810 03/27/23 0336  AST 16 18  ALT 21 20  ALKPHOS 99 101  BILITOT 0.7 0.9  PROT 7.5 7.5  ALBUMIN 3.4* 3.4*   CBG: Recent  Labs  Lab 03/27/23 0742 03/27/23 1626 03/27/23 2152 03/28/23 0743 03/28/23 1201  GLUCAP 168* 215* 192* 137* 234*    Discharge time spent: greater than 30 minutes.  Signed: Bernardino KATHEE Come, MD [facility] 03/28/2023

## 2023-03-28 NOTE — Consult Note (Addendum)
 WOC Nurse Consult Note: Reason for Consult: Requested to assess wound on the L buttock. Wound type: Pressure injury stage 2 and MASD on the buttock and scrotum. Presence of a old scar at the same place. Pressure Injury POA: Yes Measurement: 1x1cm Wound bed: 100% red Drainage (amount, consistency, odor) low amount, moisture on the skin. Periwound: moisture, macerated. Dressing procedure/placement/frequency: Powder antifungal daily on the scrotum and surrounding peri-wound skin. Cover with foam dressing, change every 3 days.  WOC team will not plan to follow further.  Please reconsult if further assistance is needed. Thank-you,  Lela Holm BSN, RN, [employer], WOC  (Pager: [phone])

## 2023-03-28 NOTE — TOC Transition Note (Signed)
 Transition of Care [facility]) - Discharge Note   Patient Details  Name: Steve Alvarado MRN: [id number] Date of Birth: 08-28-1955  Transition of Care [facility]) CM/SW Contact:  Adreanne Yono A Rowdy Guerrini, LCSWA Phone Number: 03/28/2023, 3:24 PM   Clinical Narrative:      Patient will DC to: [facility]  Anticipated DC date: 03/28/23  Family notified: [name] Public Librarian by: R[place]     Per MD patient ready for DC to [facility] . RN, patient, patient's family, and facility notified of DC. Discharge Summary sent to facility. RN to call report prior to discharge [phone]). DC packet on chart. Ambulance transport requested for patient.     CSW will sign off for now as social work intervention is no longer needed. Please consult us  again if new needs arise.   Final next level of care: Skilled Nursing Facility Barriers to Discharge: Barriers Resolved   Patient Goals and CMS Choice            Discharge Placement              Patient chooses bed at:  [facility]) Patient to be transferred to facility by: [facility] Name of family member notified: [name] Furniture Conservator/restorer Patient and family notified of of transfer: 03/28/23  Discharge Plan and Services Additional resources added to the After Visit Summary for                                       Social Drivers of Health (SDOH) Interventions SDOH Screenings   Food Insecurity: No Food Insecurity (03/27/2023)  Housing: Low Risk  (03/27/2023)  Transportation Needs: No Transportation Needs (03/27/2023)  Utilities: Not At Risk (03/27/2023)  Depression (PHQ2-9): Low Risk  (12/31/2021)  Financial Resource Strain: High Risk (10/26/2020)   Received from [facility], [facility]  Social Connections: Moderately Integrated (03/27/2023)  Tobacco Use: Low Risk  (03/26/2023)     Readmission Risk Interventions    11/08/2021   11:09 AM  Readmission Risk Prevention Plan  Transportation Screening Complete  Medication Review (RN Care  Manager) Complete  PCP or Specialist appointment within 3-5 days of discharge Complete  SW Recovery Care/Counseling Consult Complete  Palliative Care Screening Not Applicable  Skilled Nursing Facility Complete

## 2023-03-30 LAB — URINE CULTURE: Culture: >=100000 — AB

## 2023-04-07 ENCOUNTER — Encounter (HOSPITAL_COMMUNITY): Payer: Self-pay

## 2023-04-07 ENCOUNTER — Emergency Department (HOSPITAL_COMMUNITY): Payer: 59

## 2023-04-07 ENCOUNTER — Emergency Department (HOSPITAL_COMMUNITY)
Admission: EM | Admit: 2023-04-07 | Discharge: 2023-04-08 | Disposition: A | Discharge status: Home / Self Care | Payer: 59 | Attending: Emergency Medicine | Admitting: Emergency Medicine

## 2023-04-07 ENCOUNTER — Other Ambulatory Visit: Payer: Self-pay

## 2023-04-07 DIAGNOSIS — R7989 Other specified abnormal findings of blood chemistry: Secondary | ICD-10-CM | POA: Insufficient documentation

## 2023-04-07 DIAGNOSIS — Z7982 Long term (current) use of aspirin: Secondary | ICD-10-CM | POA: Insufficient documentation

## 2023-04-07 DIAGNOSIS — Z79890 Hormone replacement therapy (postmenopausal): Secondary | ICD-10-CM | POA: Insufficient documentation

## 2023-04-07 DIAGNOSIS — Z20822 Contact with and (suspected) exposure to covid-19: Secondary | ICD-10-CM | POA: Diagnosis not present

## 2023-04-07 DIAGNOSIS — I130 Hypertensive heart and chronic kidney disease with heart failure and stage 1 through stage 4 chronic kidney disease, or unspecified chronic kidney disease: Secondary | ICD-10-CM | POA: Insufficient documentation

## 2023-04-07 DIAGNOSIS — I509 Heart failure, unspecified: Secondary | ICD-10-CM | POA: Insufficient documentation

## 2023-04-07 DIAGNOSIS — R059 Cough, unspecified: Secondary | ICD-10-CM | POA: Diagnosis present

## 2023-04-07 DIAGNOSIS — N189 Chronic kidney disease, unspecified: Secondary | ICD-10-CM | POA: Insufficient documentation

## 2023-04-07 DIAGNOSIS — E1122 Type 2 diabetes mellitus with diabetic chronic kidney disease: Secondary | ICD-10-CM | POA: Diagnosis not present

## 2023-04-07 DIAGNOSIS — E039 Hypothyroidism, unspecified: Secondary | ICD-10-CM | POA: Diagnosis not present

## 2023-04-07 DIAGNOSIS — R600 Localized edema: Secondary | ICD-10-CM | POA: Diagnosis not present

## 2023-04-07 DIAGNOSIS — Z8673 Personal history of transient ischemic attack (TIA), and cerebral infarction without residual deficits: Secondary | ICD-10-CM | POA: Insufficient documentation

## 2023-04-07 DIAGNOSIS — Z79899 Other long term (current) drug therapy: Secondary | ICD-10-CM | POA: Diagnosis not present

## 2023-04-07 DIAGNOSIS — Z794 Long term (current) use of insulin: Secondary | ICD-10-CM | POA: Diagnosis not present

## 2023-04-07 DIAGNOSIS — R051 Acute cough: Secondary | ICD-10-CM | POA: Insufficient documentation

## 2023-04-07 LAB — CBC WITH DIFFERENTIAL/PLATELET
Abs Immature Granulocytes: 0.04 10*3/uL (ref 0.00–0.07)
Basophils Absolute: 0 10*3/uL (ref 0.0–0.1)
Basophils Relative: 0 %
Eosinophils Absolute: 0.4 10*3/uL (ref 0.0–0.5)
Eosinophils Relative: 4 %
HCT: 37.7 % — ABNORMAL LOW (ref 39.0–52.0)
Hemoglobin: 12 g/dL — ABNORMAL LOW (ref 13.0–17.0)
Immature Granulocytes: 0 %
Lymphocytes Relative: 24 %
Lymphs Abs: 2.3 10*3/uL (ref 0.7–4.0)
MCH: 28.8 pg (ref 26.0–34.0)
MCHC: 31.8 g/dL (ref 30.0–36.0)
MCV: 90.6 fL (ref 80.0–100.0)
Monocytes Absolute: 1 10*3/uL (ref 0.1–1.0)
Monocytes Relative: 10 %
Neutro Abs: 5.8 10*3/uL (ref 1.7–7.7)
Neutrophils Relative %: 62 %
Platelets: 355 10*3/uL (ref 150–400)
RBC: 4.16 MIL/uL — ABNORMAL LOW (ref 4.22–5.81)
RDW: 13.4 % (ref 11.5–15.5)
WBC: 9.5 10*3/uL (ref 4.0–10.5)
nRBC: 0 % (ref 0.0–0.2)

## 2023-04-07 LAB — COMPREHENSIVE METABOLIC PANEL
ALT: 16 U/L (ref 0–44)
AST: 16 U/L (ref 15–41)
Albumin: 3.6 g/dL (ref 3.5–5.0)
Alkaline Phosphatase: 94 U/L (ref 38–126)
Anion gap: 10 (ref 5–15)
BUN: 49 mg/dL — ABNORMAL HIGH (ref 8–23)
CO2: 22 mmol/L (ref 22–32)
Calcium: 9 mg/dL (ref 8.9–10.3)
Chloride: 102 mmol/L (ref 98–111)
Creatinine, Ser: 2.09 mg/dL — ABNORMAL HIGH (ref 0.61–1.24)
GFR, Estimated: 34 mL/min — ABNORMAL LOW (ref >60)
Glucose, Bld: 183 mg/dL — ABNORMAL HIGH (ref 70–99)
Potassium: 4.6 mmol/L (ref 3.5–5.1)
Sodium: 134 mmol/L — ABNORMAL LOW (ref 135–145)
Total Bilirubin: 0.7 mg/dL (ref 0.0–1.2)
Total Protein: 8 g/dL (ref 6.5–8.1)

## 2023-04-07 LAB — TSH: TSH: 2.625 u[IU]/mL (ref 0.350–4.500)

## 2023-04-07 LAB — RESP PANEL BY RT-PCR (RSV, FLU A&B, COVID)  RVPGX2
Influenza A by PCR: NEGATIVE
Influenza B by PCR: NEGATIVE
Resp Syncytial Virus by PCR: NEGATIVE
SARS Coronavirus 2 by RT PCR: NEGATIVE

## 2023-04-07 LAB — BRAIN NATRIURETIC PEPTIDE: B Natriuretic Peptide: 51.4 pg/mL (ref 0.0–100.0)

## 2023-04-07 MED ORDER — BENZONATATE 100 MG PO CAPS
100.0000 mg | ORAL_CAPSULE | Freq: Once | ORAL | Status: AC
  Administered 2023-04-08: 100 mg via ORAL
  Filled 2023-04-07: qty 1

## 2023-04-07 MED ORDER — AZITHROMYCIN 250 MG PO TABS
500.0000 mg | ORAL_TABLET | Freq: Once | ORAL | Status: AC
  Administered 2023-04-07: 500 mg via ORAL
  Filled 2023-04-07: qty 2

## 2023-04-07 MED ORDER — AMOXICILLIN-POT CLAVULANATE 875-125 MG PO TABS
1.0000 | ORAL_TABLET | Freq: Once | ORAL | Status: AC
  Administered 2023-04-07: 1 via ORAL
  Filled 2023-04-07: qty 1

## 2023-04-07 MED ORDER — AMOXICILLIN-POT CLAVULANATE 875-125 MG PO TABS: 1.0000 | ORAL_TABLET | Freq: Two times a day (BID) | ORAL | 0 refills | Status: AC

## 2023-04-07 MED ORDER — BENZONATATE 100 MG PO CAPS: 100.0000 mg | ORAL_CAPSULE | Freq: Three times a day (TID) | ORAL | 0 refills | Status: AC | PRN

## 2023-04-07 MED ORDER — AZITHROMYCIN 250 MG PO TABS: 250.0000 mg | ORAL_TABLET | Freq: Every day | ORAL | 0 refills | Status: AC

## 2023-04-07 NOTE — ED Provider Notes (Signed)
[place] EMERGENCY DEPARTMENT AT [facility] Provider Note   CSN: [id number] Arrival date & time: 04/07/23  1511     History {Add pertinent medical, surgical, social history, OB history to HPI:1} Chief Complaint  Patient presents with  . Fatigue  . Cough  . Weakness    Steve Alvarado is a 68 y.o. male with history of CVA with left-sided deficits, CHF, CKD, diabetes, hypertension, hypothyroidism, presents with concern for wet cough ongoing for the past 2 weeks.  States he is coughing up clear mucus.  Also reports feeling more fatigued than normal.  Denies any shortness of breath or chest pain.  Denies any fevers, chills, nausea, vomiting, abdominal pain.   Cough Weakness Associated symptoms: cough        Home Medications Prior to Admission medications   Medication Sig Start Date End Date Taking? Authorizing Provider  acetaminophen (TYLENOL) 500 MG tablet Take 500 mg by mouth 3 (three) times daily.    [provider]  albuterol (VENTOLIN HFA) 108 (90 Base) MCG/ACT inhaler Inhale 2 puffs into the lungs every 6 (six) hours as needed for wheezing.    [provider]  amLODipine (NORVASC) 10 MG tablet Take 1 tablet (10 mg total) by mouth every morning. 08/19/21   de Saintclair Halsted, Cortney E, NP  aspirin EC 81 MG tablet Take 1 tablet (81 mg total) by mouth daily. Swallow whole. 08/18/21   Elmer Picker, NP  atorvastatin (LIPITOR) 40 MG tablet Take 1 tablet (40 mg total) by mouth every evening. 10/23/20   Delfino Lovett, MD  baclofen (LIORESAL) 10 MG tablet Take 10 mg by mouth 2 (two) times daily.    [provider]  benzonatate (TESSALON) 100 MG capsule Take 100 mg by mouth every 6 (six) hours as needed for cough.    [provider]  Calamine-Zinc Oxide 8-8 % LOTN Apply 1 application  topically in the morning and at bedtime. Apply to scrotum topically every night and day shift for redness.    [provider]  carboxymethylcellulose  (REFRESH PLUS) 0.5 % SOLN Place 2 drops into both eyes every 4 (four) hours as needed (dry eyes).    [provider]  carvedilol (COREG) 6.25 MG tablet Take 1 tablet (6.25 mg total) by mouth 2 (two) times daily with a meal. 09/01/22   [name], DO  cetirizine (ZYRTEC) 10 MG tablet Take 10 mg by mouth at bedtime.    [provider]  clopidogrel (PLAVIX) 75 MG tablet Take 1 tablet (75 mg total) by mouth daily. 09/02/22   Pennie Banter, DO  cycloSPORINE (RESTASIS) 0.05 % ophthalmic emulsion Place 1 drop into both eyes 2 (two) times daily.    [provider]  dapagliflozin propanediol (FARXIGA) 5 MG TABS tablet Take 5 mg by mouth daily.    [provider]  dextromethorphan-guaiFENesin (ROBITUSSIN-DM) 10-100 MG/5ML liquid Take 10 mLs by mouth every 6 (six) hours as needed for cough.    [provider]  DULoxetine (CYMBALTA) 60 MG capsule Take 60 mg by mouth daily.    [provider]  ferrous sulfate 325 (65 FE) MG EC tablet Take 1 tablet (325 mg total) by mouth every other day. Patient taking differently: Take 325 mg by mouth daily. 11/09/21 03/26/23  Wouk, Wilfred Curtis, MD  furosemide (LASIX) 20 MG tablet Take 1 tablet (20 mg total) by mouth every other day. Monitor volume status and change dose accordingly. 07/28/22 07/28/23  Gillis Santa, MD  insulin aspart (NOVOLOG) 100 UNIT/ML injection Inject 5 Units into the skin 3 (three) times daily with meals. Patient not taking: Reported on 03/26/2023 09/01/22   [name], DO  insulin aspart (NOVOLOG) 100 UNIT/ML injection Inject 0-9 Units into the skin 3 (three) times daily with meals. Patient not taking: Reported on 03/26/2023 09/01/22   [name], DO  insulin lispro (HUMALOG KWIKPEN) 100 UNIT/ML KwikPen Inject 5-10 Units into the skin See admin instructions. Inject 10 units subcutaneously three times a day for diabetes. Inject 5 units subcutaneously as needed for diabetes, give additional 5  units with meals if blood sugar above 300.    [provider]  LANTUS SOLOSTAR 100 UNIT/ML Solostar Pen Inject 24 Units into the skin at bedtime. Patient taking differently: Inject 40 Units into the skin at bedtime. 09/01/22   Pennie Banter, DO  levothyroxine (SYNTHROID) 75 MCG tablet Take 75 mcg by mouth at bedtime. 2200 05/19/21   [provider]  melatonin 5 MG TABS Take 5 mg by mouth at bedtime.    [provider]  Multiple Vitamin (MULTIVITAMIN WITH MINERALS) TABS tablet Take 1 tablet by mouth daily. 09/02/22   Pennie Banter, DO  polyethylene glycol (MIRALAX / GLYCOLAX) 17 g packet Take 17 g by mouth daily as needed for mild constipation. 08/22/21   Lorin Glass, MD  pregabalin (LYRICA) 75 MG capsule Take 75 mg by mouth 3 (three) times daily.    [provider]  sacubitril-valsartan (ENTRESTO) 24-26 MG Take 1 tablet by mouth 2 (two) times daily. 11/29/21   Delma Freeze, FNP  senna-docusate (SENOKOT-S) 8.6-50 MG tablet Take 1 tablet by mouth 2 (two) times daily. Hold if having loose or frequent stools. 09/01/22   Pennie Banter, DO  traZODone (DESYREL) 50 MG tablet Take 1 tablet (50 mg total) by mouth at bedtime as needed for sleep. 03/28/23   Tyrone Nine, MD      Allergies    Ace inhibitors    Review of Systems   Review of Systems  Respiratory:  Positive for cough.   Neurological:  Positive for weakness.    Physical Exam Updated Vital Signs BP (!) 162/79 (BP Location: Right Arm)   Pulse 73   Temp 98.8 F (37.1 C) (Oral)   Resp 18   Ht 6\' 2"  (1.88 m)   Wt 108.4 kg   SpO2 100%   BMI 30.69 kg/m  Physical Exam Vitals and nursing note reviewed.  Constitutional:      General: He is not in acute distress.    Appearance: He is well-developed.     Comments: Wet cough on exam Breathing comfortably on room air, speaking in full sentences  HENT:     Head: Normocephalic and atraumatic.  Eyes:     Conjunctiva/sclera: Conjunctivae normal.   Cardiovascular:     Rate and Rhythm: Normal rate and regular rhythm.     Heart sounds: No murmur heard. Pulmonary:     Effort: Pulmonary effort is normal. No respiratory distress.     Comments: Difficult to auscultate patient's lungs as he is unable to roll to the side or sit up due to his left-sided weakness Abdominal:     Palpations: Abdomen is soft.     Tenderness: There is no abdominal tenderness.  Musculoskeletal:        General: No swelling.     Cervical back: Neck supple.     Comments: 1+ pitting edema the bilateral lower extremities  Skin:    General: Skin is warm and dry.     Capillary Refill: Capillary refill takes less than 2 seconds.  Neurological:     Mental Status: He is alert.     Comments: Baseline left-sided weakness  Psychiatric:        Mood and Affect: Mood normal.    ED Results / Procedures / Treatments   Labs (all labs ordered are listed, but only abnormal results are displayed) Labs Reviewed - No data to display  EKG None  Radiology No results found.  Procedures Procedures  {Document cardiac monitor, telemetry assessment procedure when appropriate:1}  Medications Ordered in ED Medications - No data to display  ED Course/ Medical Decision Making/ A&P   {   Click here for ABCD2, HEART and other calculatorsREFRESH Note before signing :1}                              Medical Decision Making Amount and/or Complexity of Data Reviewed Labs: ordered. Radiology: ordered.     Differential diagnosis includes but is not limited to COVID, flu, RSV, viral URI, strep pharyngitis, viral pharyngitis, allergic rhinitis, pneumonia, bronchitis, hypothyroidism, CHF   ED Course:  Patient overall well-appearing with stable vitals aside from a elevated blood pressure 159/70.  He has a wet cough on exam, but is breathing comfortably on room air.  Talking in full sentences.  Chest x-ray reviewed which showed bibasilar atelectasis, but no focal consolidations.   However, given this patient's duration of symptoms and productive cough, will treat for possible pneumonia.  I Ordered, and personally interpreted labs.  The pertinent results include:   CBC without leukocytosis CMP with slightly elevated creatinine at 1.09, this is up from 1.86 taken 11 days ago.  Slight hyponatremia 134.  LFTs within normal limits. COVID, flu, RSV negative. TSH within normal limits. BNP within normal limits.   Impression: Bronchitis vs pneumonia  Disposition:  The patient was discharged home with instructions to *** {plan:60810} Return precautions given.  Imaging Studies ordered: I ordered imaging studies including chest x-ray I independently visualized the imaging with scope of interpretation limited to determining acute life threatening conditions related to emergency care. Imaging showed bibasilar atelectasis I agree with the radiologist interpretation         {Document critical care time when appropriate:1} {Document review of labs and clinical decision tools ie heart score, Chads2Vasc2 etc:1}  {Document your independent review of radiology images, and any outside records:1} {Document your discussion with family members, caretakers, and with consultants:1} {Document social determinants of health affecting pt's care:1} {Document your decision making why or why not admission, treatments were needed:1} Final Clinical Impression(s) / ED Diagnoses Final diagnoses:  None    Rx / DC Orders ED Discharge Orders     None

## 2023-04-07 NOTE — ED Triage Notes (Signed)
Pt BIB EMS from [facility]. Pt been lethargic since this morning facility notice this is not normal called EMS. Pt baseline AAOx4 performing our ADLs. Hx of stroke weakness on left side. Pt c/o weakness and has a bad productive cough for a week; afebrile.  BP 150/70 HR 80 SpO2 95 CBG 156 Hx of Diabetes

## 2023-04-07 NOTE — Discharge Instructions (Addendum)
You have been prescribed Augmentin. Take this antibiotic 2 times a day for the next 5 days. You have also prescribed azithromycin.  Take this antibiotic once daily for the next 4 days.  Take the full course of your antibiotic even if you start feeling better. Antibiotics may cause you to have diarrhea.  You have been prescribed a cough medication called benzonatate to use as needed for cough.  Please take this as prescribed.  You are given your first dose of both these antibiotics here today to help treat a possible pneumonia.  Your flu, COVID, RSV is negative.  Your creatinine which is a measure for kidney function is slightly above your baseline.  Please continue to keep well-hydrated at home.   Please follow-up with your PCP within the next 5 days for a recheck of symptoms. Return the ER for any chest pain, shortness of breath, unexplained fevers, any other new or concerning symptoms.

## 2023-04-07 NOTE — ED Notes (Signed)
 Patient resting in bed breathing eyes closed

## 2023-04-07 NOTE — ED Provider Notes (Incomplete)
[place] EMERGENCY DEPARTMENT AT [facility] Provider Note   CSN: [id number] Arrival date & time: 04/07/23  1511     History {Add pertinent medical, surgical, social history, OB history to HPI:1} Chief Complaint  Patient presents with  . Fatigue  . Cough  . Weakness    Steve Alvarado is a 68 y.o. male with history of CVA with left-sided deficits, CHF, CKD, diabetes, hypertension, hypothyroidism, presents with concern for wet cough ongoing for the past 2 weeks.  States he is coughing up clear mucus.  Also reports feeling more fatigued than normal.  Denies any shortness of breath or chest pain.  Denies any fevers, chills, nausea, vomiting, abdominal pain.   Cough Weakness Associated symptoms: cough        Home Medications Prior to Admission medications   Medication Sig Start Date End Date Taking? Authorizing Provider  acetaminophen (TYLENOL) 500 MG tablet Take 500 mg by mouth 3 (three) times daily.    [provider]  albuterol (VENTOLIN HFA) 108 (90 Base) MCG/ACT inhaler Inhale 2 puffs into the lungs every 6 (six) hours as needed for wheezing.    [provider]  amLODipine (NORVASC) 10 MG tablet Take 1 tablet (10 mg total) by mouth every morning. 08/19/21   de Saintclair Halsted, Cortney E, NP  aspirin EC 81 MG tablet Take 1 tablet (81 mg total) by mouth daily. Swallow whole. 08/18/21   Elmer Picker, NP  atorvastatin (LIPITOR) 40 MG tablet Take 1 tablet (40 mg total) by mouth every evening. 10/23/20   Delfino Lovett, MD  baclofen (LIORESAL) 10 MG tablet Take 10 mg by mouth 2 (two) times daily.    [provider]  benzonatate (TESSALON) 100 MG capsule Take 100 mg by mouth every 6 (six) hours as needed for cough.    [provider]  Calamine-Zinc Oxide 8-8 % LOTN Apply 1 application  topically in the morning and at bedtime. Apply to scrotum topically every night and day shift for redness.    [provider]  carboxymethylcellulose  (REFRESH PLUS) 0.5 % SOLN Place 2 drops into both eyes every 4 (four) hours as needed (dry eyes).    [provider]  carvedilol (COREG) 6.25 MG tablet Take 1 tablet (6.25 mg total) by mouth 2 (two) times daily with a meal. 09/01/22   [name], DO  cetirizine (ZYRTEC) 10 MG tablet Take 10 mg by mouth at bedtime.    [provider]  clopidogrel (PLAVIX) 75 MG tablet Take 1 tablet (75 mg total) by mouth daily. 09/02/22   Pennie Banter, DO  cycloSPORINE (RESTASIS) 0.05 % ophthalmic emulsion Place 1 drop into both eyes 2 (two) times daily.    [provider]  dapagliflozin propanediol (FARXIGA) 5 MG TABS tablet Take 5 mg by mouth daily.    [provider]  dextromethorphan-guaiFENesin (ROBITUSSIN-DM) 10-100 MG/5ML liquid Take 10 mLs by mouth every 6 (six) hours as needed for cough.    [provider]  DULoxetine (CYMBALTA) 60 MG capsule Take 60 mg by mouth daily.    [provider]  ferrous sulfate 325 (65 FE) MG EC tablet Take 1 tablet (325 mg total) by mouth every other day. Patient taking differently: Take 325 mg by mouth daily. 11/09/21 03/26/23  Wouk, Wilfred Curtis, MD  furosemide (LASIX) 20 MG tablet Take 1 tablet (20 mg total) by mouth every other day. Monitor volume status and change dose accordingly. 07/28/22 07/28/23  Gillis Santa, MD  insulin aspart (NOVOLOG) 100 UNIT/ML injection Inject 5 Units into the skin 3 (three) times daily with meals. Patient not taking: Reported on 03/26/2023 09/01/22   [name], DO  insulin aspart (NOVOLOG) 100 UNIT/ML injection Inject 0-9 Units into the skin 3 (three) times daily with meals. Patient not taking: Reported on 03/26/2023 09/01/22   [name], DO  insulin lispro (HUMALOG KWIKPEN) 100 UNIT/ML KwikPen Inject 5-10 Units into the skin See admin instructions. Inject 10 units subcutaneously three times a day for diabetes. Inject 5 units subcutaneously as needed for diabetes, give additional 5  units with meals if blood sugar above 300.    [provider]  LANTUS SOLOSTAR 100 UNIT/ML Solostar Pen Inject 24 Units into the skin at bedtime. Patient taking differently: Inject 40 Units into the skin at bedtime. 09/01/22   Pennie Banter, DO  levothyroxine (SYNTHROID) 75 MCG tablet Take 75 mcg by mouth at bedtime. 2200 05/19/21   [provider]  melatonin 5 MG TABS Take 5 mg by mouth at bedtime.    [provider]  Multiple Vitamin (MULTIVITAMIN WITH MINERALS) TABS tablet Take 1 tablet by mouth daily. 09/02/22   Pennie Banter, DO  polyethylene glycol (MIRALAX / GLYCOLAX) 17 g packet Take 17 g by mouth daily as needed for mild constipation. 08/22/21   Lorin Glass, MD  pregabalin (LYRICA) 75 MG capsule Take 75 mg by mouth 3 (three) times daily.    [provider]  sacubitril-valsartan (ENTRESTO) 24-26 MG Take 1 tablet by mouth 2 (two) times daily. 11/29/21   Delma Freeze, FNP  senna-docusate (SENOKOT-S) 8.6-50 MG tablet Take 1 tablet by mouth 2 (two) times daily. Hold if having loose or frequent stools. 09/01/22   Pennie Banter, DO  traZODone (DESYREL) 50 MG tablet Take 1 tablet (50 mg total) by mouth at bedtime as needed for sleep. 03/28/23   Tyrone Nine, MD      Allergies    Ace inhibitors    Review of Systems   Review of Systems  Respiratory:  Positive for cough.   Neurological:  Positive for weakness.    Physical Exam Updated Vital Signs BP (!) 162/79 (BP Location: Right Arm)   Pulse 73   Temp 98.8 F (37.1 C) (Oral)   Resp 18   Ht 6\' 2"  (1.88 m)   Wt 108.4 kg   SpO2 100%   BMI 30.69 kg/m  Physical Exam Vitals and nursing note reviewed.  Constitutional:      General: He is not in acute distress.    Appearance: He is well-developed.     Comments: Wet cough on exam Breathing comfortably on room air, speaking in full sentences  HENT:     Head: Normocephalic and atraumatic.  Eyes:     Conjunctiva/sclera: Conjunctivae normal.   Cardiovascular:     Rate and Rhythm: Normal rate and regular rhythm.     Heart sounds: No murmur heard. Pulmonary:     Effort: Pulmonary effort is normal. No respiratory distress.     Comments: Difficult to auscultate patient's lungs as he is unable to roll to the side or sit up due to his left-sided weakness Abdominal:     Palpations: Abdomen is soft.     Tenderness: There is no abdominal tenderness.  Musculoskeletal:        General: No swelling.     Cervical back: Neck supple.     Comments: 1+ pitting edema the bilateral lower extremities  Skin:    General: Skin is warm and dry.     Capillary Refill: Capillary refill takes less than 2 seconds.  Neurological:     Mental Status: He is alert.     Comments: Baseline left-sided weakness  Psychiatric:        Mood and Affect: Mood normal.     ED Results / Procedures / Treatments   Labs (all labs ordered are listed, but only abnormal results are displayed) Labs Reviewed - No data to display  EKG None  Radiology No results found.  Procedures Procedures  {Document cardiac monitor, telemetry assessment procedure when appropriate:1}  Medications Ordered in ED Medications - No data to display  ED Course/ Medical Decision Making/ A&P   {   Click here for ABCD2, HEART and other calculatorsREFRESH Note before signing :1}                              Medical Decision Making Amount and/or Complexity of Data Reviewed Labs: ordered. Radiology: ordered.  Risk Prescription drug management.     Differential diagnosis includes but is not limited to COVID, flu, RSV, viral URI, strep pharyngitis, viral pharyngitis, allergic rhinitis, pneumonia, bronchitis, hypothyroidism, CHF   ED Course:  Patient overall well-appearing with stable vitals aside from a elevated blood pressure 159/70.  He has a wet cough on exam, but is breathing comfortably on room air.  Talking in full sentences.  Chest x-ray reviewed which showed bibasilar  atelectasis, but no focal consolidations.  However, given this patient's duration of symptoms and productive cough, will treat for possible pneumonia.  I Ordered, and personally interpreted labs.  The pertinent results include:   CBC without leukocytosis CMP with slightly elevated creatinine at 1.09, this is up from 1.86 taken 11 days ago.  Slight hyponatremia 134.  LFTs within normal limits. COVID, flu, RSV negative. TSH within normal limits. BNP within normal limits.   Impression: Bronchitis vs pneumonia  Disposition:  The patient was discharged home with instructions to *** {plan:60810} Return precautions given.  Imaging Studies ordered: I ordered imaging studies including chest x-ray I independently visualized the imaging with scope of interpretation limited to determining acute life threatening conditions related to emergency care. Imaging showed bibasilar atelectasis I agree with the radiologist interpretation         {Document critical care time when appropriate:1} {Document review of labs and clinical decision tools ie heart score, Chads2Vasc2 etc:1}  {Document your independent review of radiology images, and any outside records:1} {Document your discussion with family members, caretakers, and with consultants:1} {Document social determinants of health affecting pt's care:1} {Document your decision making why or why not admission, treatments were needed:1} Final Clinical Impression(s) / ED Diagnoses Final diagnoses:  None    Rx / DC Orders ED Discharge Orders     None

## 2023-04-07 NOTE — ED Notes (Signed)
[facility] called for patient. Per [facility] it will be a while due to about 11-12 people ahead of patient.

## 2023-04-08 DIAGNOSIS — R051 Acute cough: Secondary | ICD-10-CM | POA: Diagnosis not present

## 2023-06-05 ENCOUNTER — Encounter (HOSPITAL_COMMUNITY): Payer: Self-pay | Admitting: Family Medicine

## 2023-06-05 ENCOUNTER — Emergency Department (HOSPITAL_COMMUNITY)

## 2023-06-05 ENCOUNTER — Inpatient Hospital Stay (HOSPITAL_COMMUNITY)
Admission: EM | Admit: 2023-06-05 | Discharge: 2023-06-07 | DRG: 690 | Disposition: A | Discharge status: Skilled Nursing Facility | Source: Skilled Nursing Facility | Attending: Family Medicine | Admitting: Family Medicine

## 2023-06-05 ENCOUNTER — Other Ambulatory Visit: Payer: Self-pay

## 2023-06-05 DIAGNOSIS — F32A Depression, unspecified: Secondary | ICD-10-CM | POA: Diagnosis present

## 2023-06-05 DIAGNOSIS — Z79890 Hormone replacement therapy (postmenopausal): Secondary | ICD-10-CM | POA: Diagnosis not present

## 2023-06-05 DIAGNOSIS — I130 Hypertensive heart and chronic kidney disease with heart failure and stage 1 through stage 4 chronic kidney disease, or unspecified chronic kidney disease: Secondary | ICD-10-CM | POA: Diagnosis present

## 2023-06-05 DIAGNOSIS — E039 Hypothyroidism, unspecified: Secondary | ICD-10-CM | POA: Diagnosis present

## 2023-06-05 DIAGNOSIS — N390 Urinary tract infection, site not specified: Principal | ICD-10-CM

## 2023-06-05 DIAGNOSIS — Z7902 Long term (current) use of antithrombotics/antiplatelets: Secondary | ICD-10-CM | POA: Diagnosis not present

## 2023-06-05 DIAGNOSIS — I160 Hypertensive urgency: Secondary | ICD-10-CM | POA: Diagnosis present

## 2023-06-05 DIAGNOSIS — Z8673 Personal history of transient ischemic attack (TIA), and cerebral infarction without residual deficits: Secondary | ICD-10-CM

## 2023-06-05 DIAGNOSIS — I440 Atrioventricular block, first degree: Secondary | ICD-10-CM | POA: Diagnosis present

## 2023-06-05 DIAGNOSIS — Z8249 Family history of ischemic heart disease and other diseases of the circulatory system: Secondary | ICD-10-CM | POA: Diagnosis not present

## 2023-06-05 DIAGNOSIS — I5032 Chronic diastolic (congestive) heart failure: Secondary | ICD-10-CM | POA: Diagnosis present

## 2023-06-05 DIAGNOSIS — E1122 Type 2 diabetes mellitus with diabetic chronic kidney disease: Secondary | ICD-10-CM | POA: Diagnosis present

## 2023-06-05 DIAGNOSIS — Z8679 Personal history of other diseases of the circulatory system: Secondary | ICD-10-CM

## 2023-06-05 DIAGNOSIS — R509 Fever, unspecified: Secondary | ICD-10-CM

## 2023-06-05 DIAGNOSIS — E1165 Type 2 diabetes mellitus with hyperglycemia: Secondary | ICD-10-CM | POA: Diagnosis present

## 2023-06-05 DIAGNOSIS — Z1152 Encounter for screening for COVID-19: Secondary | ICD-10-CM | POA: Diagnosis not present

## 2023-06-05 DIAGNOSIS — E871 Hypo-osmolality and hyponatremia: Secondary | ICD-10-CM | POA: Diagnosis present

## 2023-06-05 DIAGNOSIS — N1831 Chronic kidney disease, stage 3a: Secondary | ICD-10-CM | POA: Diagnosis present

## 2023-06-05 DIAGNOSIS — Z79899 Other long term (current) drug therapy: Secondary | ICD-10-CM

## 2023-06-05 DIAGNOSIS — E785 Hyperlipidemia, unspecified: Secondary | ICD-10-CM | POA: Diagnosis present

## 2023-06-05 DIAGNOSIS — A419 Sepsis, unspecified organism: Secondary | ICD-10-CM

## 2023-06-05 DIAGNOSIS — I69398 Other sequelae of cerebral infarction: Secondary | ICD-10-CM | POA: Diagnosis not present

## 2023-06-05 DIAGNOSIS — I2510 Atherosclerotic heart disease of native coronary artery without angina pectoris: Secondary | ICD-10-CM | POA: Diagnosis present

## 2023-06-05 DIAGNOSIS — J189 Pneumonia, unspecified organism: Principal | ICD-10-CM

## 2023-06-05 DIAGNOSIS — Z7982 Long term (current) use of aspirin: Secondary | ICD-10-CM | POA: Diagnosis not present

## 2023-06-05 DIAGNOSIS — Z794 Long term (current) use of insulin: Secondary | ICD-10-CM

## 2023-06-05 LAB — URINALYSIS, W/ REFLEX TO CULTURE (INFECTION SUSPECTED)
Bilirubin Urine: NEGATIVE
Glucose, UA: >=500 mg/dL — AB
Ketones, ur: 5 mg/dL — AB
Nitrite: POSITIVE — AB
Protein, ur: 100 mg/dL — AB
Specific Gravity, Urine: 1.014 (ref 1.005–1.030)
WBC, UA: >50 WBC/hpf (ref 0–5)
pH: 5 (ref 5.0–8.0)

## 2023-06-05 LAB — CBC WITH DIFFERENTIAL/PLATELET
Abs Immature Granulocytes: 0.02 10*3/uL (ref 0.00–0.07)
Basophils Absolute: 0 10*3/uL (ref 0.0–0.1)
Basophils Relative: 0 %
Eosinophils Absolute: 0.3 10*3/uL (ref 0.0–0.5)
Eosinophils Relative: 6 %
HCT: 32 % — ABNORMAL LOW (ref 39.0–52.0)
Hemoglobin: 10.9 g/dL — ABNORMAL LOW (ref 13.0–17.0)
Immature Granulocytes: 0 %
Lymphocytes Relative: 23 %
Lymphs Abs: 1.2 10*3/uL (ref 0.7–4.0)
MCH: 30.1 pg (ref 26.0–34.0)
MCHC: 34.1 g/dL (ref 30.0–36.0)
MCV: 88.4 fL (ref 80.0–100.0)
Monocytes Absolute: 0.7 10*3/uL (ref 0.1–1.0)
Monocytes Relative: 14 %
Neutro Abs: 2.9 10*3/uL (ref 1.7–7.7)
Neutrophils Relative %: 57 %
Platelets: 258 10*3/uL (ref 150–400)
RBC: 3.62 MIL/uL — ABNORMAL LOW (ref 4.22–5.81)
RDW: 12.8 % (ref 11.5–15.5)
WBC: 5.2 10*3/uL (ref 4.0–10.5)
nRBC: 0 % (ref 0.0–0.2)

## 2023-06-05 LAB — RESP PANEL BY RT-PCR (RSV, FLU A&B, COVID)  RVPGX2
Influenza A by PCR: NEGATIVE
Influenza B by PCR: NEGATIVE
Resp Syncytial Virus by PCR: NEGATIVE
SARS Coronavirus 2 by RT PCR: NEGATIVE

## 2023-06-05 LAB — COMPREHENSIVE METABOLIC PANEL
ALT: 14 U/L (ref 0–44)
AST: 30 U/L (ref 15–41)
Albumin: 3.7 g/dL (ref 3.5–5.0)
Alkaline Phosphatase: 83 U/L (ref 38–126)
Anion gap: 10 (ref 5–15)
BUN: 25 mg/dL — ABNORMAL HIGH (ref 8–23)
CO2: 21 mmol/L — ABNORMAL LOW (ref 22–32)
Calcium: 8.8 mg/dL — ABNORMAL LOW (ref 8.9–10.3)
Chloride: 101 mmol/L (ref 98–111)
Creatinine, Ser: 1.31 mg/dL — ABNORMAL HIGH (ref 0.61–1.24)
GFR, Estimated: 59 mL/min — ABNORMAL LOW (ref >60)
Glucose, Bld: 116 mg/dL — ABNORMAL HIGH (ref 70–99)
Potassium: 3.9 mmol/L (ref 3.5–5.1)
Sodium: 132 mmol/L — ABNORMAL LOW (ref 135–145)
Total Bilirubin: 0.9 mg/dL (ref 0.0–1.2)
Total Protein: 8 g/dL (ref 6.5–8.1)

## 2023-06-05 LAB — PROTIME-INR
INR: 0.9 (ref 0.8–1.2)
Prothrombin Time: 12.8 s (ref 11.4–15.2)

## 2023-06-05 LAB — BRAIN NATRIURETIC PEPTIDE: B Natriuretic Peptide: 125 pg/mL — ABNORMAL HIGH (ref 0.0–100.0)

## 2023-06-05 LAB — CBG MONITORING, ED
Glucose-Capillary: 107 mg/dL — ABNORMAL HIGH (ref 70–99)
Glucose-Capillary: 111 mg/dL — ABNORMAL HIGH (ref 70–99)

## 2023-06-05 LAB — I-STAT CG4 LACTIC ACID, ED: Lactic Acid, Venous: 1 mmol/L (ref 0.5–1.9)

## 2023-06-05 MED ORDER — INSULIN ASPART 100 UNIT/ML IJ SOLN
0.0000 [IU] | Freq: Every day | INTRAMUSCULAR | Status: DC
  Filled 2023-06-05: qty 0.05

## 2023-06-05 MED ORDER — SACUBITRIL-VALSARTAN 24-26 MG PO TABS
1.0000 | ORAL_TABLET | Freq: Two times a day (BID) | ORAL | Status: DC
  Administered 2023-06-06 – 2023-06-07 (×4): 1 via ORAL
  Filled 2023-06-05 (×6): qty 1

## 2023-06-05 MED ORDER — SODIUM CHLORIDE 0.9 % IV SOLN
2.0000 g | Freq: Once | INTRAVENOUS | Status: AC
  Administered 2023-06-05: 2 g via INTRAVENOUS
  Filled 2023-06-05: qty 12.5

## 2023-06-05 MED ORDER — MELATONIN 5 MG PO TABS
5.0000 mg | ORAL_TABLET | Freq: Every day | ORAL | Status: DC
  Administered 2023-06-05 – 2023-06-06 (×2): 5 mg via ORAL
  Filled 2023-06-05 (×2): qty 1

## 2023-06-05 MED ORDER — OXYCODONE HCL 5 MG PO TABS
5.0000 mg | ORAL_TABLET | ORAL | Status: DC | PRN
  Administered 2023-06-05 – 2023-06-07 (×3): 5 mg via ORAL
  Filled 2023-06-05 (×3): qty 1

## 2023-06-05 MED ORDER — TRAZODONE HCL 50 MG PO TABS: 50.0000 mg | ORAL_TABLET | Freq: Every evening | ORAL | Status: DC | PRN

## 2023-06-05 MED ORDER — SODIUM CHLORIDE 0.9 % IV BOLUS
1000.0000 mL | Freq: Once | INTRAVENOUS | Status: AC
  Administered 2023-06-05: 1000 mL via INTRAVENOUS

## 2023-06-05 MED ORDER — ATORVASTATIN CALCIUM 40 MG PO TABS
40.0000 mg | ORAL_TABLET | Freq: Every day | ORAL | Status: DC
  Administered 2023-06-06: 40 mg via ORAL
  Filled 2023-06-05: qty 1

## 2023-06-05 MED ORDER — PREGABALIN 50 MG PO CAPS: 75.0000 mg | ORAL_CAPSULE | Freq: Three times a day (TID) | ORAL | Status: DC

## 2023-06-05 MED ORDER — INSULIN GLARGINE 100 UNIT/ML ~~LOC~~ SOLN
25.0000 [IU] | Freq: Every day | SUBCUTANEOUS | Status: DC
  Administered 2023-06-05: 25 [IU] via SUBCUTANEOUS
  Filled 2023-06-05: qty 0.25

## 2023-06-05 MED ORDER — ACETAMINOPHEN 325 MG PO TABS: 650.0000 mg | ORAL_TABLET | Freq: Four times a day (QID) | ORAL | Status: DC | PRN

## 2023-06-05 MED ORDER — INSULIN ASPART 100 UNIT/ML IJ SOLN
0.0000 [IU] | Freq: Three times a day (TID) | INTRAMUSCULAR | Status: DC
  Administered 2023-06-07: 1 [IU] via SUBCUTANEOUS
  Administered 2023-06-07: 2 [IU] via SUBCUTANEOUS
  Filled 2023-06-05: qty 0.09

## 2023-06-05 MED ORDER — ONDANSETRON HCL 4 MG/2ML IJ SOLN: 4.0000 mg | Freq: Four times a day (QID) | INTRAMUSCULAR | Status: DC | PRN

## 2023-06-05 MED ORDER — SENNOSIDES-DOCUSATE SODIUM 8.6-50 MG PO TABS: 1.0000 | ORAL_TABLET | Freq: Every evening | ORAL | Status: DC | PRN

## 2023-06-05 MED ORDER — ACETAMINOPHEN 650 MG RE SUPP: 650.0000 mg | Freq: Four times a day (QID) | RECTAL | Status: DC | PRN

## 2023-06-05 MED ORDER — DULOXETINE HCL 30 MG PO CPEP
60.0000 mg | ORAL_CAPSULE | Freq: Every day | ORAL | Status: DC
  Administered 2023-06-06 – 2023-06-07 (×2): 60 mg via ORAL
  Filled 2023-06-05 (×2): qty 2

## 2023-06-05 MED ORDER — CLOPIDOGREL BISULFATE 75 MG PO TABS
75.0000 mg | ORAL_TABLET | Freq: Every day | ORAL | Status: DC
  Administered 2023-06-07: 75 mg via ORAL
  Filled 2023-06-05 (×2): qty 1

## 2023-06-05 MED ORDER — VANCOMYCIN HCL IN DEXTROSE 1-5 GM/200ML-% IV SOLN
1000.0000 mg | Freq: Once | INTRAVENOUS | Status: DC
  Filled 2023-06-05: qty 200

## 2023-06-05 MED ORDER — AMLODIPINE BESYLATE 5 MG PO TABS
10.0000 mg | ORAL_TABLET | Freq: Every morning | ORAL | Status: DC
Start: 2023-06-06 — End: 2023-06-07
  Administered 2023-06-06 – 2023-06-07 (×2): 10 mg via ORAL
  Filled 2023-06-05: qty 1
  Filled 2023-06-05: qty 2

## 2023-06-05 MED ORDER — HYDRALAZINE HCL 25 MG PO TABS: 25.0000 mg | ORAL_TABLET | Freq: Four times a day (QID) | ORAL | Status: DC | PRN

## 2023-06-05 MED ORDER — ASPIRIN 81 MG PO TBEC
81.0000 mg | DELAYED_RELEASE_TABLET | Freq: Every day | ORAL | Status: DC
  Administered 2023-06-06 – 2023-06-07 (×2): 81 mg via ORAL
  Filled 2023-06-05 (×2): qty 1

## 2023-06-05 MED ORDER — LEVOTHYROXINE SODIUM 75 MCG PO TABS
75.0000 ug | ORAL_TABLET | Freq: Every day | ORAL | Status: DC
  Administered 2023-06-06 (×2): 75 ug via ORAL
  Filled 2023-06-05 (×2): qty 1

## 2023-06-05 MED ORDER — GUAIFENESIN 100 MG/5ML PO LIQD
5.0000 mL | ORAL | Status: DC | PRN
  Administered 2023-06-05: 5 mL via ORAL
  Filled 2023-06-05: qty 10

## 2023-06-05 MED ORDER — SODIUM CHLORIDE 0.9 % IV SOLN
2.0000 g | INTRAVENOUS | Status: DC
  Administered 2023-06-06 – 2023-06-07 (×2): 2 g via INTRAVENOUS
  Filled 2023-06-05 (×2): qty 20

## 2023-06-05 MED ORDER — ENOXAPARIN SODIUM 40 MG/0.4ML IJ SOSY
40.0000 mg | PREFILLED_SYRINGE | INTRAMUSCULAR | Status: DC
  Administered 2023-06-05 – 2023-06-06 (×2): 40 mg via SUBCUTANEOUS
  Filled 2023-06-05 (×2): qty 0.4

## 2023-06-05 MED ORDER — INSULIN ASPART 100 UNIT/ML IJ SOLN
5.0000 [IU] | Freq: Three times a day (TID) | INTRAMUSCULAR | Status: DC
  Administered 2023-06-06 – 2023-06-07 (×2): 5 [IU] via SUBCUTANEOUS
  Filled 2023-06-05: qty 0.05

## 2023-06-05 MED ORDER — CARVEDILOL 6.25 MG PO TABS
6.2500 mg | ORAL_TABLET | Freq: Two times a day (BID) | ORAL | Status: DC
  Administered 2023-06-06 – 2023-06-07 (×3): 6.25 mg via ORAL
  Filled 2023-06-05: qty 2
  Filled 2023-06-05: qty 1
  Filled 2023-06-05: qty 2

## 2023-06-05 MED ORDER — SODIUM CHLORIDE 0.9% FLUSH
3.0000 mL | Freq: Two times a day (BID) | INTRAVENOUS | Status: DC
  Administered 2023-06-05 – 2023-06-07 (×4): 3 mL via INTRAVENOUS

## 2023-06-05 MED ORDER — ONDANSETRON HCL 4 MG PO TABS: 4.0000 mg | ORAL_TABLET | Freq: Four times a day (QID) | ORAL | Status: DC | PRN

## 2023-06-05 NOTE — ED Provider Notes (Signed)
 [place] [facility]PARTMENT AT [facility] Provider Note   CSN: [id number] Arrival date & time: 06/05/23  1600     History  Chief Complaint  Patient presents with   Cough   Fever    Steve Alvarado is a 68 y.o. male.  With a past medical history of CVA with residual left-sided deficits, CAD, HFpEF, type 2 diabetes and CKD who presents to the ED for coughing and fever.  Patient is a resident of skilled nursing facility and has been recently treated for pneumonia with outpatient antibiotics.  Nursing home staff became concerned today when he reported increased coughing and spiked a fever.  Patient also notes decreased appetite over the last week or so while he has been ill.  No nausea vomiting diarrhea or other GI symptoms.  Denies chest pain and shortness of breath at this time.   Cough Associated symptoms: fever   Fever Associated symptoms: cough        Home Medications Prior to Admission medications   Medication Sig Start Date End Date Taking? Authorizing Provider  acetaminophen (TYLENOL) 500 MG tablet Take 500 mg by mouth 3 (three) times daily.    [provider]  albuterol (VENTOLIN HFA) 108 (90 Base) MCG/ACT inhaler Inhale 2 puffs into the lungs every 6 (six) hours as needed for wheezing.    [provider]  amLODipine (NORVASC) 10 MG tablet Take 1 tablet (10 mg total) by mouth every morning. 08/19/21   de Saintclair Halsted, Cortney E, NP  aspirin EC 81 MG tablet Take 1 tablet (81 mg total) by mouth daily. Swallow whole. 08/18/21   Elmer Picker, NP  atorvastatin (LIPITOR) 40 MG tablet Take 1 tablet (40 mg total) by mouth every evening. 10/23/20   Delfino Lovett, MD  baclofen (LIORESAL) 10 MG tablet Take 10 mg by mouth 2 (two) times daily.    [provider]  benzonatate (TESSALON) 100 MG capsule Take 1 capsule (100 mg total) by mouth every 8 (eight) hours as needed for cough. 04/07/23   Arabella Merles, PA-C  Calamine-Zinc Oxide 8-8 % LOTN Apply 1  application  topically in the morning and at bedtime. Apply to scrotum topically every night and day shift for redness.    [provider]  carboxymethylcellulose (REFRESH PLUS) 0.5 % SOLN Place 2 drops into both eyes every 4 (four) hours as needed (dry eyes).    [provider]  carvedilol (COREG) 6.25 MG tablet Take 1 tablet (6.25 mg total) by mouth 2 (two) times daily with a meal. 09/01/22   [name], DO  cetirizine (ZYRTEC) 10 MG tablet Take 10 mg by mouth at bedtime.    [provider]  clopidogrel (PLAVIX) 75 MG tablet Take 1 tablet (75 mg total) by mouth daily. 09/02/22   Pennie Banter, DO  cycloSPORINE (RESTASIS) 0.05 % ophthalmic emulsion Place 1 drop into both eyes 2 (two) times daily.    [provider]  dapagliflozin propanediol (FARXIGA) 5 MG TABS tablet Take 5 mg by mouth daily.    [provider]  dextromethorphan-guaiFENesin (ROBITUSSIN-DM) 10-100 MG/5ML liquid Take 10 mLs by mouth every 6 (six) hours as needed for cough.    [provider]  DULoxetine (CYMBALTA) 60 MG capsule Take 60 mg by mouth daily.    [provider]  ferrous sulfate 325 (65 FE) MG EC tablet Take 1 tablet (325 mg total) by mouth every other day. Patient taking differently: Take 325 mg by mouth  daily. 11/09/21 03/26/23  Wouk, Wilfred Curtis, MD  furosemide (LASIX) 20 MG tablet Take 1 tablet (20 mg total) by mouth every other day. Monitor volume status and change dose accordingly. 07/28/22 07/28/23  Gillis Santa, MD  insulin aspart (NOVOLOG) 100 UNIT/ML injection Inject 5 Units into the skin 3 (three) times daily with meals. Patient not taking: Reported on 03/26/2023 09/01/22   [name], DO  insulin aspart (NOVOLOG) 100 UNIT/ML injection Inject 0-9 Units into the skin 3 (three) times daily with meals. Patient not taking: Reported on 03/26/2023 09/01/22   [name], DO  insulin lispro (HUMALOG KWIKPEN) 100 UNIT/ML KwikPen Inject 5-10 Units  into the skin See admin instructions. Inject 10 units subcutaneously three times a day for diabetes. Inject 5 units subcutaneously as needed for diabetes, give additional 5 units with meals if blood sugar above 300.    [provider]  LANTUS SOLOSTAR 100 UNIT/ML Solostar Pen Inject 24 Units into the skin at bedtime. Patient taking differently: Inject 40 Units into the skin at bedtime. 09/01/22   Pennie Banter, DO  levothyroxine (SYNTHROID) 75 MCG tablet Take 75 mcg by mouth at bedtime. 2200 05/19/21   [provider]  melatonin 5 MG TABS Take 5 mg by mouth at bedtime.    [provider]  Multiple Vitamin (MULTIVITAMIN WITH MINERALS) TABS tablet Take 1 tablet by mouth daily. 09/02/22   Pennie Banter, DO  polyethylene glycol (MIRALAX / GLYCOLAX) 17 g packet Take 17 g by mouth daily as needed for mild constipation. 08/22/21   Lorin Glass, MD  pregabalin (LYRICA) 75 MG capsule Take 75 mg by mouth 3 (three) times daily.    [provider]  sacubitril-valsartan (ENTRESTO) 24-26 MG Take 1 tablet by mouth 2 (two) times daily. 11/29/21   Delma Freeze, FNP  senna-docusate (SENOKOT-S) 8.6-50 MG tablet Take 1 tablet by mouth 2 (two) times daily. Hold if having loose or frequent stools. 09/01/22   Pennie Banter, DO  traZODone (DESYREL) 50 MG tablet Take 1 tablet (50 mg total) by mouth at bedtime as needed for sleep. 03/28/23   Tyrone Nine, MD      Allergies    Ace inhibitors    Review of Systems   Review of Systems  Constitutional:  Positive for fever.  Respiratory:  Positive for cough.     Physical Exam Updated Vital Signs BP (!) 169/76 (BP Location: Right Arm)   Pulse 79   Temp (!) 100.5 F (38.1 C) (Oral)   Resp 18   Ht 6\' 2"  (1.88 m)   Wt 102.1 kg   SpO2 95%   BMI 28.89 kg/m  Physical Exam Vitals and nursing note reviewed.  HENT:     Head: Normocephalic and atraumatic.  Eyes:     Pupils: Pupils are equal, round, and reactive to light.   Cardiovascular:     Rate and Rhythm: Normal rate and regular rhythm.  Pulmonary:     Effort: Pulmonary effort is normal.     Breath sounds: Normal breath sounds.  Abdominal:     Palpations: Abdomen is soft.     Tenderness: There is no abdominal tenderness.  Skin:    General: Skin is warm and dry.  Neurological:     Mental Status: He is alert.  Psychiatric:        Mood and Affect: Mood normal.     ED Results / Procedures / Treatments   Labs (all labs ordered are  listed, but only abnormal results are displayed) Labs Reviewed  COMPREHENSIVE METABOLIC PANEL - Abnormal; Notable for the following components:      Result Value   Sodium 132 (*)    CO2 21 (*)    Glucose, Bld 116 (*)    BUN 25 (*)    Creatinine, Ser 1.31 (*)    Calcium 8.8 (*)    GFR, Estimated 59 (*)    All other components within normal limits  URINALYSIS, W/ REFLEX TO CULTURE (INFECTION SUSPECTED) - Abnormal; Notable for the following components:   APPearance CLOUDY (*)    Glucose, UA >=500 (*)    Hgb urine dipstick MODERATE (*)    Ketones, ur 5 (*)    Protein, ur 100 (*)    Nitrite POSITIVE (*)    Leukocytes,Ua LARGE (*)    Bacteria, UA MANY (*)    All other components within normal limits  BRAIN NATRIURETIC PEPTIDE - Abnormal; Notable for the following components:   B Natriuretic Peptide 125.0 (*)    All other components within normal limits  CBC WITH DIFFERENTIAL/PLATELET - Abnormal; Notable for the following components:   RBC 3.62 (*)    Hemoglobin 10.9 (*)    HCT 32.0 (*)    All other components within normal limits  CBG MONITORING, ED - Abnormal; Notable for the following components:   Glucose-Capillary 107 (*)    All other components within normal limits  RESP PANEL BY RT-PCR (RSV, FLU A&B, COVID)  RVPGX2  CULTURE, BLOOD (ROUTINE X 2)  CULTURE, BLOOD (ROUTINE X 2)  URINE CULTURE  PROTIME-INR  CBC WITH DIFFERENTIAL/PLATELET  I-STAT CG4 LACTIC ACID, ED  I-STAT CG4 LACTIC ACID, ED     EKG EKG Interpretation Date/Time:  Monday June 05 2023 16:27:57 EDT Ventricular Rate:  90 PR Interval:  309 QRS Duration:  87 QT Interval:  347 QTC Calculation: 425 R Axis:   39  Text Interpretation: Sinus rhythm Prolonged PR interval Nonspecific T abnormalities, lateral leads Baseline wander in lead(s) V6 Confirmed by Estelle June [phone]) on 06/05/2023 6:21:07 PM  Radiology DG Chest Port 1 View Result Date: 06/05/2023 CLINICAL DATA:  [id number] Sepsis (HCC) [id number].  Cough EXAM: PORTABLE CHEST 1 VIEW COMPARISON:  04/07/2023 FINDINGS: The heart size and mediastinal contours are stable. Mildly prominent bibasilar interstitial markings, more pronounced on the left. No large pleural fluid collection. No pneumothorax. IMPRESSION: Mildly prominent bibasilar interstitial markings, more pronounced on the left, which may reflect atelectasis versus developing infiltrate. Electronically Signed   By: Duanne Guess D.O.   On: 06/05/2023 18:51    Procedures .Critical Care  Performed by: Royanne Foots, DO Authorized by: Royanne Foots, DO   Critical care provider statement:    Critical care time (minutes):  40   Critical care was necessary to treat or prevent imminent or life-threatening deterioration of the following conditions:  Sepsis   Critical care was time spent personally by me on the following activities:  Development of treatment plan with patient or surrogate, discussions with consultants, evaluation of patient's response to treatment, examination of patient, ordering and review of laboratory studies, ordering and review of radiographic studies, ordering and performing treatments and interventions, pulse oximetry, re-evaluation of patient's condition and review of old charts   I assumed direction of critical care for this patient from another provider in my specialty: no     Care discussed with: admitting provider       Medications Ordered in ED Medications  vancomycin  (  VANCOCIN) IVPB 1000 mg/200 mL premix (has no administration in time range)  sodium chloride 0.9 % bolus 1,000 mL (1,000 mLs Intravenous New Bag/Given 06/05/23 1711)  ceFEPIme (MAXIPIME) 2 g in sodium chloride 0.9 % 100 mL IVPB (2 g Intravenous New Bag/Given 06/05/23 1714)    ED Course/ Medical Decision Making/ A&P Clinical Course as of 06/05/23 2028  Mon Jun 05, 2023  2027 Chest x-ray concerning for left-sided pneumonia.  No significant leukocytosis.  UA shows UTI.  COVID RSV influenza all negative.  Patient mains stable on room air.  Has received cefepime and Vanco.  Discussed with admitting hospitalist who accept patient for admission [MP]    Clinical Course User Index [MP] Royanne Foots, DO                                 Medical Decision Making 68 year old male with history as above presenting from nursing facility given concern for treatment refractory pneumonia.  Initial vital signs concerning for tachypnea and fever of 103 Fahrenheit.  Meeting SIRS criteria with likely respiratory source.  Will obtain infectious workup including blood cultures lactate provide IV fluids and cover for healthcare associated pneumonia with vancomycin and cefepime.  Amount and/or Complexity of Data Reviewed Labs: ordered. Radiology: ordered.  Risk Prescription drug management. Decision regarding hospitalization.           Final Clinical Impression(s) / ED Diagnoses Final diagnoses:  Pneumonia of left lower lobe due to infectious organism  Urinary tract infection without hematuria, site unspecified  Sepsis, due to unspecified organism, unspecified whether acute organ dysfunction present (HCC)  Fever, unspecified fever cause    Rx / DC Orders ED Discharge Orders     None         Royanne Foots, DO 06/05/23 2029

## 2023-06-05 NOTE — H&P (Signed)
 History and Physical    Steve Alvarado VHQ:[id number] DOB: 08-21-1955 DOA: 06/05/2023  PCP: Shelby Mattocks, MD   Patient coming from: SNF   Chief Complaint: Fever, dysuria, loss of appetite, cough    HPI: Steve Alvarado is a 68 y.o. male with medical history significant for hypertension, insulin-dependent diabetes mellitus, CKD 3A, CAD, chronic HFpEF, hypothyroidism, depression, and CVA with residual deficits who presents with fever, loss of appetite, and cough.  The patient reports 3 weeks of dysuria, more recent fevers and loss of appetite, and non-productive cough. He denies SOB or chest pain. He denies flank pain.    ED Course: Upon arrival to the ED, patient is found to be febrile to 39.4 C and saturating well on room air with normal heart rate and elevated blood pressure.  Labs are most notable for sodium 132, creatinine 1.31, normal WBC, normal lactic acid, negative respiratory virus panel, pyuria, bacteriuria, and nitrites.   Blood and urine cultures were collected in the ED and the patient was treated with 1 L NS, cefepime, and vancomycin.  Review of Systems:  All other systems reviewed and apart from HPI, are negative.  Past Medical History:  Diagnosis Date   Acute kidney injury (HCC) 08/20/2021   Basal ganglia stroke (HCC)    a. 07/2021 L sided wkns/facial droop/fall-->MRI brain: nonhemorrhagic infarct of posterior R lentiform nucleus and corona radiata measure. Remote lacunar infarcts of post cerebellum bilat and L thalamus.   CHF (congestive heart failure) (HCC)    CKD (chronic kidney disease), stage II    a. 08/2021 AKI w/ creat up to 3.4.   Cocaine use    Coronary artery disease    a. 07/2010 PCI: LAD 65m, LCX 80 diff/small, OM1 85m, RCA 62m (3.0x18 Vision BMS). EF 50%; b. 07/2013 Cath: LM nl, LAD 30p, 20d, LCX 20d, LCX 100, OM1 60, RCA 30p, 62m, RPDA 90-->Med Rx; c. 10/2020 MV: EF 46% (60-65% by echo), no ischemia. 3V cor Ca2+.   Depression    Diabetes mellitus     Type II   Diastolic dysfunction    a. 10/2020 Echo: EF 60-65%, no rwma, mild LVH, GrI DD, nl RV size/fxn; b. 07/2021 Echo: EF 60-65%, no rwma, GrI DD, nl RV fxn, mildly dil LA, triv MR; c. 10/2021 Echo: EF 55-60%, no rwma, GrI DD, nl RV fxn, mild-mod MR, Asc Ao 37mm.   Hyperlipidemia    Hypertension    Hypertensive urgency 03/08/2015   Hypothyroidism     Past Surgical History:  Procedure Laterality Date   CARDIAC CATHETERIZATION  08/06/2010   Bare metal stent placed in RCA.   HAND SURGERY     left    Social History:   reports that he has never smoked. He has never used smokeless tobacco. He reports current drug use. Drug: Cocaine. He reports that he does not drink alcohol.  Allergies  Allergen Reactions   Ace Inhibitors Cough    Reaction not listed on MAR    Family History  Problem Relation Age of Onset   Heart attack Mother      Prior to Admission medications   Medication Sig Start Date End Date Taking? Authorizing Provider  acetaminophen (TYLENOL) 500 MG tablet Take 500 mg by mouth 3 (three) times daily.    [provider]  albuterol (VENTOLIN HFA) 108 (90 Base) MCG/ACT inhaler Inhale 2 puffs into the lungs every 6 (six) hours as needed for wheezing.    [provider]  amLODipine (  NORVASC) 10 MG tablet Take 1 tablet (10 mg total) by mouth every morning. 08/19/21   de Saintclair Halsted, Cortney E, NP  aspirin EC 81 MG tablet Take 1 tablet (81 mg total) by mouth daily. Swallow whole. 08/18/21   Elmer Picker, NP  atorvastatin (LIPITOR) 40 MG tablet Take 1 tablet (40 mg total) by mouth every evening. 10/23/20   Delfino Lovett, MD  baclofen (LIORESAL) 10 MG tablet Take 10 mg by mouth 2 (two) times daily.    [provider]  benzonatate (TESSALON) 100 MG capsule Take 1 capsule (100 mg total) by mouth every 8 (eight) hours as needed for cough. 04/07/23   Arabella Merles, PA-C  Calamine-Zinc Oxide 8-8 % LOTN Apply 1 application  topically in the morning and at bedtime.  Apply to scrotum topically every night and day shift for redness.    [provider]  carboxymethylcellulose (REFRESH PLUS) 0.5 % SOLN Place 2 drops into both eyes every 4 (four) hours as needed (dry eyes).    [provider]  carvedilol (COREG) 6.25 MG tablet Take 1 tablet (6.25 mg total) by mouth 2 (two) times daily with a meal. 09/01/22   [name], DO  cetirizine (ZYRTEC) 10 MG tablet Take 10 mg by mouth at bedtime.    [provider]  clopidogrel (PLAVIX) 75 MG tablet Take 1 tablet (75 mg total) by mouth daily. 09/02/22   Pennie Banter, DO  cycloSPORINE (RESTASIS) 0.05 % ophthalmic emulsion Place 1 drop into both eyes 2 (two) times daily.    [provider]  dapagliflozin propanediol (FARXIGA) 5 MG TABS tablet Take 5 mg by mouth daily.    [provider]  dextromethorphan-guaiFENesin (ROBITUSSIN-DM) 10-100 MG/5ML liquid Take 10 mLs by mouth every 6 (six) hours as needed for cough.    [provider]  DULoxetine (CYMBALTA) 60 MG capsule Take 60 mg by mouth daily.    [provider]  ferrous sulfate 325 (65 FE) MG EC tablet Take 1 tablet (325 mg total) by mouth every other day. Patient taking differently: Take 325 mg by mouth daily. 11/09/21 03/26/23  Wouk, Wilfred Curtis, MD  furosemide (LASIX) 20 MG tablet Take 1 tablet (20 mg total) by mouth every other day. Monitor volume status and change dose accordingly. 07/28/22 07/28/23  Gillis Santa, MD  insulin aspart (NOVOLOG) 100 UNIT/ML injection Inject 5 Units into the skin 3 (three) times daily with meals. Patient not taking: Reported on 03/26/2023 09/01/22   [name], DO  insulin aspart (NOVOLOG) 100 UNIT/ML injection Inject 0-9 Units into the skin 3 (three) times daily with meals. Patient not taking: Reported on 03/26/2023 09/01/22   [name], DO  insulin lispro (HUMALOG KWIKPEN) 100 UNIT/ML KwikPen Inject 5-10 Units into the skin See admin instructions. Inject 10 units  subcutaneously three times a day for diabetes. Inject 5 units subcutaneously as needed for diabetes, give additional 5 units with meals if blood sugar above 300.    [provider]  LANTUS SOLOSTAR 100 UNIT/ML Solostar Pen Inject 24 Units into the skin at bedtime. Patient taking differently: Inject 40 Units into the skin at bedtime. 09/01/22   Pennie Banter, DO  levothyroxine (SYNTHROID) 75 MCG tablet Take 75 mcg by mouth at bedtime. 2200 05/19/21   [provider]  melatonin 5 MG TABS Take 5 mg by mouth at bedtime.    [provider]  Multiple Vitamin (MULTIVITAMIN WITH MINERALS) TABS tablet Take 1 tablet by  mouth daily. 09/02/22   Pennie Banter, DO  polyethylene glycol (MIRALAX / GLYCOLAX) 17 g packet Take 17 g by mouth daily as needed for mild constipation. 08/22/21   Lorin Glass, MD  pregabalin (LYRICA) 75 MG capsule Take 75 mg by mouth 3 (three) times daily.    [provider]  sacubitril-valsartan (ENTRESTO) 24-26 MG Take 1 tablet by mouth 2 (two) times daily. 11/29/21   Delma Freeze, FNP  senna-docusate (SENOKOT-S) 8.6-50 MG tablet Take 1 tablet by mouth 2 (two) times daily. Hold if having loose or frequent stools. 09/01/22   Pennie Banter, DO  traZODone (DESYREL) 50 MG tablet Take 1 tablet (50 mg total) by mouth at bedtime as needed for sleep. 03/28/23   Tyrone Nine, MD    Physical Exam: Vitals:   06/05/23 1613 06/05/23 1630 06/05/23 1854  BP: (!) 175/74 (!) 205/92 (!) 169/76  Pulse:  87 79  Resp: 18 (!) 33 18  Temp: (!) 103 F (39.4 C)  (!) 100.5 F (38.1 C)  TempSrc: Oral  Oral  SpO2: 96% 96% 95%  Weight: 102.1 kg    Height: 6\' 2"  (1.88 m)       Constitutional: NAD, no pallor or diaphoresis   Eyes: PERTLA, lids and conjunctivae normal ENMT: Mucous membranes are moist. Posterior pharynx clear of any exudate or lesions.   Neck: supple, no masses  Respiratory: no wheezing, no crackles. No accessory muscle use.  Cardiovascular: S1 &  S2 heard, regular rate and rhythm. No extremity edema.  Abdomen: No tenderness, soft. Bowel sounds active.  Musculoskeletal: no clubbing / cyanosis. No joint deformity upper and lower extremities.   Skin: no significant rashes, lesions, ulcers. Warm, dry, well-perfused. Neurologic: CN 2-12 grossly intact. Left-sided weakness. Alert and oriented.  Psychiatric: Calm. Cooperative.    Labs and Imaging on Admission: I have personally reviewed following labs and imaging studies  CBC: Recent Labs  Lab 06/05/23 1710  WBC 5.2  NEUTROABS 2.9  HGB 10.9*  HCT 32.0*  MCV 88.4  PLT 258   Basic Metabolic Panel: Recent Labs  Lab 06/05/23 1641  NA 132*  K 3.9  CL 101  CO2 21*  GLUCOSE 116*  BUN 25*  CREATININE 1.31*  CALCIUM 8.8*   GFR: Estimated Creatinine Clearance: 67.9 mL/min (A) (by C-G formula based on SCr of 1.31 mg/dL (H)). Liver Function Tests: Recent Labs  Lab 06/05/23 1641  AST 30  ALT 14  ALKPHOS 83  BILITOT 0.9  PROT 8.0  ALBUMIN 3.7   No results for input(s): "LIPASE", "AMYLASE" in the last 168 hours. No results for input(s): "AMMONIA" in the last 168 hours. Coagulation Profile: Recent Labs  Lab 06/05/23 1641  INR 0.9   Cardiac Enzymes: No results for input(s): "CKTOTAL", "CKMB", "CKMBINDEX", "TROPONINI" in the last 168 hours. BNP (last 3 results) No results for input(s): "PROBNP" in the last 8760 hours. HbA1C: No results for input(s): "HGBA1C" in the last 72 hours. CBG: Recent Labs  Lab 06/05/23 1643  GLUCAP 107*   Lipid Profile: No results for input(s): "CHOL", "HDL", "LDLCALC", "TRIG", "CHOLHDL", "LDLDIRECT" in the last 72 hours. Thyroid Function Tests: No results for input(s): "TSH", "T4TOTAL", "FREET4", "T3FREE", "THYROIDAB" in the last 72 hours. Anemia Panel: No results for input(s): "VITAMINB12", "FOLATE", "FERRITIN", "TIBC", "IRON", "RETICCTPCT" in the last 72 hours. Urine analysis:    Component Value Date/Time   COLORURINE YELLOW  06/05/2023 1833   APPEARANCEUR CLOUDY (A) 06/05/2023 1833   APPEARANCEUR Clear 08/22/2013  0057   LABSPEC 1.014 06/05/2023 1833   LABSPEC 1.017 08/22/2013 0057   PHURINE 5.0 06/05/2023 1833   GLUCOSEU >=500 (A) 06/05/2023 1833   GLUCOSEU >=500 08/22/2013 0057   HGBUR MODERATE (A) 06/05/2023 1833   BILIRUBINUR NEGATIVE 06/05/2023 1833   BILIRUBINUR Negative 08/22/2013 0057   KETONESUR 5 (A) 06/05/2023 1833   PROTEINUR 100 (A) 06/05/2023 1833   NITRITE POSITIVE (A) 06/05/2023 1833   LEUKOCYTESUR LARGE (A) 06/05/2023 1833   LEUKOCYTESUR Negative 08/22/2013 0057   Sepsis Labs: @LABRCNTIP (procalcitonin:4,lacticidven:4) ) Recent Results (from the past 240 hours)  Resp panel by RT-PCR (RSV, Flu A&B, Covid) Anterior Nasal Swab     Status: None   Collection Time: 06/05/23  6:33 PM   Specimen: Anterior Nasal Swab  Result Value Ref Range Status   SARS Coronavirus 2 by RT PCR NEGATIVE NEGATIVE Final    Comment: (NOTE) SARS-CoV-2 target nucleic acids are NOT DETECTED.  The SARS-CoV-2 RNA is generally detectable in upper respiratory specimens during the acute phase of infection. The lowest concentration of SARS-CoV-2 viral copies this assay can detect is 138 copies/mL. A negative result does not preclude SARS-Cov-2 infection and should not be used as the sole basis for treatment or other patient management decisions. A negative result may occur with  improper specimen collection/handling, submission of specimen other than nasopharyngeal swab, presence of viral mutation(s) within the areas targeted by this assay, and inadequate number of viral copies(<138 copies/mL). A negative result must be combined with clinical observations, patient history, and epidemiological information. The expected result is Negative.  Fact Sheet for Patients:  BloggerCourse.com  Fact Sheet for Healthcare Providers:  SeriousBroker.it  This test is no t yet  approved or cleared by the [place] FDA and  has been authorized for detection and/or diagnosis of SARS-CoV-2 by FDA under an Emergency Use Authorization (EUA). This EUA will remain  in effect (meaning this test can be used) for the duration of the COVID-19 declaration under Section 564(b)(1) of the Act, 21 U.S.C.section 360bbb-3(b)(1), unless the authorization is terminated  or revoked sooner.       Influenza A by PCR NEGATIVE NEGATIVE Final   Influenza B by PCR NEGATIVE NEGATIVE Final    Comment: (NOTE) The Xpert Xpress SARS-CoV-2/FLU/RSV plus assay is intended as an aid in the diagnosis of influenza from Nasopharyngeal swab specimens and should not be used as a sole basis for treatment. Nasal washings and aspirates are unacceptable for Xpert Xpress SARS-CoV-2/FLU/RSV testing.  Fact Sheet for Patients: BloggerCourse.com  Fact Sheet for Healthcare Providers: SeriousBroker.it  This test is not yet approved or cleared by the [place] FDA and has been authorized for detection and/or diagnosis of SARS-CoV-2 by FDA under an Emergency Use Authorization (EUA). This EUA will remain in effect (meaning this test can be used) for the duration of the COVID-19 declaration under Section 564(b)(1) of the Act, 21 U.S.C. section 360bbb-3(b)(1), unless the authorization is terminated or revoked.     Resp Syncytial Virus by PCR NEGATIVE NEGATIVE Final    Comment: (NOTE) Fact Sheet for Patients: BloggerCourse.com  Fact Sheet for Healthcare Providers: SeriousBroker.it  This test is not yet approved or cleared by the [place] FDA and has been authorized for detection and/or diagnosis of SARS-CoV-2 by FDA under an Emergency Use Authorization (EUA). This EUA will remain in effect (meaning this test can be used) for the duration of the COVID-19 declaration under Section 564(b)(1) of  the Act, 21 U.S.C. section 360bbb-3(b)(1), unless the authorization  is terminated or revoked.  Performed at [facility], [address] [place], [place] [address]      Radiological Exams on Admission: DG Chest Port 1 View Result Date: 06/05/2023 CLINICAL DATA:  [id number] Sepsis (HCC) [id number].  Cough EXAM: PORTABLE CHEST 1 VIEW COMPARISON:  04/07/2023 FINDINGS: The heart size and mediastinal contours are stable. Mildly prominent bibasilar interstitial markings, more pronounced on the left. No large pleural fluid collection. No pneumothorax. IMPRESSION: Mildly prominent bibasilar interstitial markings, more pronounced on the left, which may reflect atelectasis versus developing infiltrate. Electronically Signed   By: Duanne Guess D.O.   On: 06/05/2023 18:51    EKG: Independently reviewed. Sinus rhythm, 1st degree AV block.   Assessment/Plan   1. Sepsis secondary to UTI  - Treat with Rocephin, follow cultures and clinical course    2. Hypertensive urgency  - BP as high as 205/92 in ED without target-organ damage  - Continue Coreg and Norvasc, use hydralazine as-needed   3. CAD  - No anginal symptoms   - ASA, Plavix, Lipitor, Coreg   4. Hx of CVA  - ASA, Plavix, Lipitor  5. CKD 3A  - Appears close to baseline  - Renally-dose medications    6. Insulin-dependent DM  - A1c was 10.2% in January 2025  - Check CBGs and continue long- and short-acting insulin   7. Chronic HFpEF  - Appears compensated  - Continue Coreg and Entresto, monitor weight and I/Os   8. Depression  - Cymbalta, trazodone    DVT prophylaxis: Lovenox  Code Status: Full  Level of Care: Level of care: Progressive Family Communication: None present   Disposition Plan:  Patient is from: SNF  Anticipated d/c is to: SNF  Anticipated d/c date is: 06/08/23  Patient currently: Pending treatment of sepsis  Consults called: None  Admission status: Inpatient     Briscoe Deutscher,  MD [facility]  06/05/2023, 8:34 PM

## 2023-06-05 NOTE — ED Notes (Signed)
 Only able to obtain one blood culture bottle at this time

## 2023-06-05 NOTE — ED Triage Notes (Signed)
 BIBA from [facility] for cough, recent dx of pneumonia. 650 Tylenol given PTA. 95% room air 20 RR 210/98 BP 86 HR 122 CBG 100.8 temp

## 2023-06-05 NOTE — ED Notes (Signed)
 3 unsuccessful IV attempts, also using ultrasound

## 2023-06-06 DIAGNOSIS — A419 Sepsis, unspecified organism: Secondary | ICD-10-CM | POA: Diagnosis not present

## 2023-06-06 DIAGNOSIS — N390 Urinary tract infection, site not specified: Secondary | ICD-10-CM | POA: Diagnosis not present

## 2023-06-06 LAB — GLUCOSE, CAPILLARY
Glucose-Capillary: 118 mg/dL — ABNORMAL HIGH (ref 70–99)
Glucose-Capillary: 100 mg/dL — ABNORMAL HIGH (ref 70–99)

## 2023-06-06 LAB — CBC
HCT: 30 % — ABNORMAL LOW (ref 39.0–52.0)
Hemoglobin: 9.8 g/dL — ABNORMAL LOW (ref 13.0–17.0)
MCH: 29.5 pg (ref 26.0–34.0)
MCHC: 32.7 g/dL (ref 30.0–36.0)
MCV: 90.4 fL (ref 80.0–100.0)
Platelets: 196 10*3/uL (ref 150–400)
RBC: 3.32 MIL/uL — ABNORMAL LOW (ref 4.22–5.81)
RDW: 12.9 % (ref 11.5–15.5)
WBC: 4.4 10*3/uL (ref 4.0–10.5)
nRBC: 0 % (ref 0.0–0.2)

## 2023-06-06 LAB — BASIC METABOLIC PANEL
Anion gap: 10 (ref 5–15)
BUN: 23 mg/dL (ref 8–23)
CO2: 21 mmol/L — ABNORMAL LOW (ref 22–32)
Calcium: 8.6 mg/dL — ABNORMAL LOW (ref 8.9–10.3)
Chloride: 104 mmol/L (ref 98–111)
Creatinine, Ser: 1.23 mg/dL (ref 0.61–1.24)
GFR, Estimated: >60 mL/min (ref >60)
Glucose, Bld: 98 mg/dL (ref 70–99)
Potassium: 3.6 mmol/L (ref 3.5–5.1)
Sodium: 135 mmol/L (ref 135–145)

## 2023-06-06 LAB — URINE CULTURE: Culture: <10000 — AB

## 2023-06-06 LAB — CBG MONITORING, ED
Glucose-Capillary: 71 mg/dL (ref 70–99)
Glucose-Capillary: 92 mg/dL (ref 70–99)

## 2023-06-06 MED ORDER — INSULIN GLARGINE 100 UNIT/ML ~~LOC~~ SOLN
25.0000 [IU] | Freq: Every day | SUBCUTANEOUS | Status: DC
  Filled 2023-06-06 (×2): qty 0.25

## 2023-06-06 MED ORDER — BENZONATATE 100 MG PO CAPS
100.0000 mg | ORAL_CAPSULE | Freq: Once | ORAL | Status: AC
  Administered 2023-06-06: 100 mg via ORAL
  Filled 2023-06-06: qty 1

## 2023-06-06 MED ORDER — PREGABALIN 50 MG PO CAPS
50.0000 mg | ORAL_CAPSULE | Freq: Two times a day (BID) | ORAL | Status: DC
  Administered 2023-06-06 – 2023-06-07 (×3): 50 mg via ORAL
  Filled 2023-06-06 (×3): qty 1

## 2023-06-06 NOTE — ED Notes (Signed)
 Report given to oncoming nurse.

## 2023-06-06 NOTE — Progress Notes (Signed)
 PROGRESS NOTE  Steve Alvarado    DOB: [birth year], 68 y.o.  HYQ:[id number]    Code Status: Full Code   DOA: 06/05/2023   LOS: 1   Brief hospital course  Steve Alvarado is a 68 y.o. male with medical history significant for hypertension, insulin-dependent diabetes mellitus, CKD 3A, CAD, chronic HFpEF, hypothyroidism, depression, and CVA with residual deficits who presents with fever, loss of appetite, and cough.   The patient reports 3 weeks of dysuria, more recent fevers and loss of appetite, and non-productive cough. He denies SOB or chest pain. He denies flank pain.     ED Course: febrile to 103 and saturating well on room air with normal heart rate and elevated blood pressure.  Labs are most notable for sodium 132, creatinine 1.31, normal WBC, normal lactic acid, negative respiratory virus panel, pyuria, bacteriuria, and nitrites.    Blood and urine cultures were collected in the ED and the patient was treated with 1 L NS, cefepime, and vancomycin.  Patient was admitted to medicine service for further workup and management of UTI as outlined in detail below.  06/06/23 -stable  Assessment & Plan  Principal Problem:   Sepsis secondary to UTI [facility]) Active Problems:   CKD stage 3a, GFR 45-59 ml/min (HCC)   Chronic diastolic CHF (congestive heart failure) (HCC)   Uncontrolled type 2 diabetes mellitus with hyperglycemia, with long-term current use of insulin (HCC)   Hypothyroidism   Hypertensive urgency   Depression   History of CAD (coronary artery disease)   History of CVA (cerebrovascular accident)  Sepsis has been ruled out UTI- urinalysis positive for nitrites, large leukocytes, >50WBC, >500 glucose. Urine culture pending - Treat with Rocephin, follow cultures and clinical course     2. Hypertensive urgency  - BP as high as 205/92 in ED without target-organ damage  - Continue Coreg and Norvasc, entresto   3. CAD- No anginal symptoms   - ASA, Plavix, Lipitor, Coreg     4. Hx of CVA  - ASA, Plavix, Lipitor   5. CKD 3A  - Appears close to baseline  - Renally-dose medications     6. Insulin-dependent DM  - A1c was 10.2% in January 2025  - Check CBGs and continue long- and short-acting insulin    7. Chronic HFpEF  - Appears compensated  - Continue Coreg and Entresto, monitor weight and I/Os    8. Depression  - Cymbalta, trazodone   Body mass index is 28.89 kg/m.  VTE ppx: enoxaparin (LOVENOX) injection 40 mg Start: 06/05/23 2200  Diet:     Diet   Diet regular Room service appropriate? Yes; Fluid consistency: Thin   Consultants: None   Subjective 06/06/23    Pt reports no complaints. He is comfortably resting. Endorses some dysuria that has improved.    Objective   Vitals:   06/06/23 0245 06/06/23 0246 06/06/23 0559 06/06/23 0730  BP: (!) 148/72 (!) 148/72 (!) 146/78 (!) 162/79  Pulse:  81 72 73  Resp: 20 20 16 18   Temp:  98 F (36.7 C) 98.5 F (36.9 C)   TempSrc:  Oral Oral   SpO2:  97% 97% 94%  Weight:      Height:       No intake or output data in the 24 hours ending 06/06/23 0747 Filed Weights   06/05/23 1613  Weight: 102.1 kg     Physical Exam:  General: awake, alert, NAD. Tired-appearing HEENT: atraumatic, clear conjunctiva, anicteric sclera, MMM,  hearing grossly normal Respiratory: normal respiratory effort. Cardiovascular: quick capillary refill, normal S1/S2, RRR, no JVD, murmurs Gastrointestinal: soft, NT, ND Nervous: A&O x3. no gross focal neurologic deficits, normal speech Extremities: moves all equally, no edema, normal tone Skin: dry, intact, normal temperature, normal color. No rashes, lesions or ulcers on exposed skin Psychiatry: normal mood, congruent affect  Labs   I have personally reviewed the following labs and imaging studies CBC    Component Value Date/Time   WBC 5.2 06/05/2023 1710   RBC 3.62 (L) 06/05/2023 1710   HGB 10.9 (L) 06/05/2023 1710   HGB 15.2 04/24/2014 0934   HCT 32.0 (L)  06/05/2023 1710   HCT 44.6 04/24/2014 0934   PLT 258 06/05/2023 1710   PLT 252 04/24/2014 0934   MCV 88.4 06/05/2023 1710   MCV 89 04/24/2014 0934   MCH 30.1 06/05/2023 1710   MCHC 34.1 06/05/2023 1710   RDW 12.8 06/05/2023 1710   RDW 12.7 04/24/2014 0934   LYMPHSABS 1.2 06/05/2023 1710   LYMPHSABS 2.4 04/24/2014 0934   MONOABS 0.7 06/05/2023 1710   MONOABS 0.5 04/24/2014 0934   EOSABS 0.3 06/05/2023 1710   EOSABS 0.3 04/24/2014 0934   BASOSABS 0.0 06/05/2023 1710   BASOSABS 0.0 04/24/2014 0934      Latest Ref Rng & Units 06/05/2023    4:41 PM 04/07/2023    5:45 PM 03/28/2023    6:08 AM  BMP  Glucose 70 - 99 mg/dL 161  096  045   BUN 8 - 23 mg/dL 25  49  27   Creatinine 0.61 - 1.24 mg/dL 4.09  8.11  9.14   Sodium 135 - 145 mmol/L 132  134  138   Potassium 3.5 - 5.1 mmol/L 3.9  4.6  4.7   Chloride 98 - 111 mmol/L 101  102  106   CO2 22 - 32 mmol/L 21  22  23    Calcium 8.9 - 10.3 mg/dL 8.8  9.0  9.0     DG Chest Port 1 View Result Date: 06/05/2023 CLINICAL DATA:  [id number] Sepsis (HCC) [id number].  Cough EXAM: PORTABLE CHEST 1 VIEW COMPARISON:  04/07/2023 FINDINGS: The heart size and mediastinal contours are stable. Mildly prominent bibasilar interstitial markings, more pronounced on the left. No large pleural fluid collection. No pneumothorax. IMPRESSION: Mildly prominent bibasilar interstitial markings, more pronounced on the left, which may reflect atelectasis versus developing infiltrate. Electronically Signed   By: Duanne Guess D.O.   On: 06/05/2023 18:51    Disposition Plan & Communication  Patient status: Inpatient  Admitted From: Home Planned disposition location: Home Anticipated discharge date: 3/19 pending culture results  Family Communication: none at bedside    Author: Leeroy Bock, DO [facility] 06/06/2023, 7:47 AM   Available by Epic secure chat 7AM-7PM. If 7PM-7AM, please contact night-coverage.  [facility] contact information found on  [email or web address].

## 2023-06-07 DIAGNOSIS — A419 Sepsis, unspecified organism: Secondary | ICD-10-CM | POA: Diagnosis not present

## 2023-06-07 DIAGNOSIS — N390 Urinary tract infection, site not specified: Secondary | ICD-10-CM | POA: Diagnosis not present

## 2023-06-07 LAB — CBC
HCT: 32.8 % — ABNORMAL LOW (ref 39.0–52.0)
Hemoglobin: 10.7 g/dL — ABNORMAL LOW (ref 13.0–17.0)
MCH: 29.2 pg (ref 26.0–34.0)
MCHC: 32.6 g/dL (ref 30.0–36.0)
MCV: 89.6 fL (ref 80.0–100.0)
Platelets: 211 10*3/uL (ref 150–400)
RBC: 3.66 MIL/uL — ABNORMAL LOW (ref 4.22–5.81)
RDW: 12.9 % (ref 11.5–15.5)
WBC: 4.4 10*3/uL (ref 4.0–10.5)
nRBC: 0 % (ref 0.0–0.2)

## 2023-06-07 LAB — BASIC METABOLIC PANEL
Anion gap: 11 (ref 5–15)
BUN: 23 mg/dL (ref 8–23)
CO2: 20 mmol/L — ABNORMAL LOW (ref 22–32)
Calcium: 8.5 mg/dL — ABNORMAL LOW (ref 8.9–10.3)
Chloride: 103 mmol/L (ref 98–111)
Creatinine, Ser: 1.26 mg/dL — ABNORMAL HIGH (ref 0.61–1.24)
GFR, Estimated: >60 mL/min (ref >60)
Glucose, Bld: 143 mg/dL — ABNORMAL HIGH (ref 70–99)
Potassium: 3.5 mmol/L (ref 3.5–5.1)
Sodium: 134 mmol/L — ABNORMAL LOW (ref 135–145)

## 2023-06-07 LAB — GLUCOSE, CAPILLARY
Glucose-Capillary: 140 mg/dL — ABNORMAL HIGH (ref 70–99)
Glucose-Capillary: 183 mg/dL — ABNORMAL HIGH (ref 70–99)

## 2023-06-07 MED ORDER — ZINC OXIDE 40 % EX OINT
TOPICAL_OINTMENT | Freq: Two times a day (BID) | CUTANEOUS | Status: DC
  Filled 2023-06-07: qty 57

## 2023-06-07 MED ORDER — INSULIN ASPART 100 UNIT/ML IJ SOLN
0.0000 [IU] | Freq: Three times a day (TID) | INTRAMUSCULAR | 11 refills | Status: DC
Start: 2023-06-07 — End: 2023-09-25

## 2023-06-07 MED ORDER — SULFAMETHOXAZOLE-TRIMETHOPRIM 800-160 MG PO TABS: 1.0000 | ORAL_TABLET | Freq: Two times a day (BID) | ORAL | 0 refills | Status: AC

## 2023-06-07 NOTE — TOC Transition Note (Signed)
 Transition of Care [facility]) - Discharge Note   Patient Details  Name: Steve Alvarado MRN: [id number] Date of Birth: 03-24-1955  [facility] CM/SW Contact:  Lanier Clam, RN Phone Number: 06/07/2023, 2:09 PM   Clinical Narrative: d/c back to [facility]  LTC rep [place] accepted. Going to rm#[id number],report tel#[phone]. PTAR called. No further CM needs.      Final next level of care: Long Term Nursing Home Barriers to Discharge: No Barriers Identified   Patient Goals and CMS Choice Patient states their goals for this hospitalization and ongoing recovery are:: [facility] -LTC [employer].gov Compare Post Acute Care list provided to:: Patient Represenative (must comment) ([name](spouse)[phone]) Choice offered to / list presented to : Spouse [place] ownership interest in [facility].provided to:: Spouse    Discharge Placement              Patient chooses bed at: Other - please specify in the comment section below: [facility]) Patient to be transferred to facility by: PTAR Name of family member notified: [name](spouse) Patient and family notified of of transfer: 06/07/23  Discharge Plan and Services Additional resources added to the After Visit Summary for     Discharge Planning Services: CM Consult                                 Social Drivers of Health (SDOH) Interventions SDOH Screenings   Food Insecurity: No Food Insecurity (06/06/2023)  Housing: Low Risk  (06/06/2023)  Transportation Needs: No Transportation Needs (06/06/2023)  Utilities: Not At Risk (06/06/2023)  Depression (PHQ2-9): Low Risk  (12/31/2021)  Financial Resource Strain: High Risk (10/26/2020)   Received from [facility], [facility]  Social Connections: Unknown (06/06/2023)  Tobacco Use: Low Risk  (06/05/2023)     Readmission Risk Interventions    06/07/2023   11:17 AM 11/08/2021   11:09 AM  Readmission Risk Prevention Plan   Transportation Screening Complete Complete  Medication Review (RN Care Manager) Complete Complete  PCP or Specialist appointment within 3-5 days of discharge Complete Complete  HRI or Home Care Consult Complete   SW Recovery Care/Counseling Consult Complete Complete  Palliative Care Screening Not Applicable Not Applicable  Skilled Nursing Facility Complete Complete

## 2023-06-07 NOTE — Progress Notes (Signed)
 Reported called to [facility], Spoke to [employer], LPN.

## 2023-06-07 NOTE — Discharge Summary (Signed)
 Physician Discharge Summary   Patient: Steve Alvarado MRN: [id number] DOB: [birth year]  Admit date:     06/05/2023  Discharge date: 06/07/23  Discharge Physician: Debarah Crape   PCP: Shelby Mattocks, MD   Recommendations at discharge:   Follow up with Primary care physician for all chronic medication management  Discharge Diagnoses: Principal Problem:   Sepsis secondary to UTI [facility]) Active Problems:   CKD stage 3a, GFR 45-59 ml/min (HCC)   Chronic diastolic CHF (congestive heart failure) (HCC)   Urinary tract infection, recurrent   Uncontrolled type 2 diabetes mellitus with hyperglycemia, with long-term current use of insulin (HCC)   Hypothyroidism   Hypertensive urgency   Depression   History of CAD (coronary artery disease)   History of CVA (cerebrovascular accident)  Resolved Problems:   * No resolved hospital problems. *  Hospital Course: Steve Alvarado is 68 y.o. male with hypertension, insulin-dependent diabetes, CKD stage IIIa, CAD, chronic heart failure with preserved EF, hypothyroidism, depression, prior CVA with residual deficits, presents with fever, loss of appetite, cough, dysuria.  In the ED he was found to be febrile to 103, with pyuria, bacteriuria, and hyponatremia.  Started on broad-spectrum antibiotics and admitted.  Urine cultures had insignificant growth.  Sepsis was ruled out.  Blood cultures were negative to date.  Patient was then transition to Bactrim.  By reevaluation on 3/19 he reports feeling much improved and back to baseline.  Will be discharging back to his nursing facility today.   Urinary tract infection - Complete Bactrim outpatient for 7 days total therapy. - Cultures with insignificant growth - Blood cultures negative - No further fever - Sepsis has been ruled out   Hypertensive urgency Blood pressure improved now - BP 205/92 without endorgan damage - Continue Coreg, Norvasc, Entresto.     CAD - No anginal symptoms -  Continue aspirin, Plavix, Lipitor, beta-blocker   History of CVA - Continue home meds   CKD stage IIIa - Appears close to baseline now - Renally dose with creatinine clearance of 70 when needed - Avoid nephrotoxic meds   Insulin-dependent diabetes mellitus, uncontrolled - Hemoglobin A1c 10.2% - Continue basal/bolus insulin, titrate up as needed   Chronic heart failure with preserved EF - Clinically compensated at this time - Continue home meds - Follow I's and O's   Depression - Continue home meds Cymbalta and trazodone   BMI 28 - Outpatient follow up for lifestyle modification and risk factor management   Hyponatremia - 132 on arrival, gradually resolving.  134 now.       Consultants: n/a Procedures performed: n/a  Disposition: Nursing home Diet recommendation:  Discharge Diet Orders (From admission, onward)     Start     Ordered   06/07/23 0000  Diet general        06/07/23 1336           Cardiac and Carb modified diet DISCHARGE MEDICATION: Allergies as of 06/07/2023       Reactions   Ace Inhibitors Cough   Reaction not listed on MAR        Medication List     STOP taking these medications    HumaLOG KwikPen 100 UNIT/ML KwikPen Generic drug: insulin lispro       TAKE these medications    acetaminophen 500 MG tablet Commonly known as: TYLENOL Take 500 mg by mouth 3 (three) times daily.   albuterol 108 (90 Base) MCG/ACT inhaler Commonly known as: VENTOLIN HFA Inhale 2  puffs into the lungs every 6 (six) hours as needed for wheezing.   amLODipine 10 MG tablet Commonly known as: NORVASC Take 1 tablet (10 mg total) by mouth every morning.   aspirin EC 81 MG tablet Take 1 tablet (81 mg total) by mouth daily. Swallow whole.   atorvastatin 40 MG tablet Commonly known as: LIPITOR Take 1 tablet (40 mg total) by mouth every evening.   baclofen 10 MG tablet Commonly known as: LIORESAL Take 10 mg by mouth 2 (two) times daily.    benzonatate 100 MG capsule Commonly known as: TESSALON Take 1 capsule (100 mg total) by mouth every 8 (eight) hours as needed for cough.   Calamine-Zinc Oxide 8-8 % Lotn Apply 1 application  topically in the morning and at bedtime. Apply to scrotum topically every night and day shift for redness.   carboxymethylcellulose 0.5 % Soln Commonly known as: REFRESH PLUS Place 2 drops into both eyes every 4 (four) hours as needed (dry eyes).   carvedilol 6.25 MG tablet Commonly known as: COREG Take 1 tablet (6.25 mg total) by mouth 2 (two) times daily with a meal.   cetirizine 10 MG tablet Commonly known as: ZYRTEC Take 10 mg by mouth at bedtime.   clopidogrel 75 MG tablet Commonly known as: PLAVIX Take 1 tablet (75 mg total) by mouth daily.   cycloSPORINE 0.05 % ophthalmic emulsion Commonly known as: RESTASIS Place 1 drop into both eyes 2 (two) times daily.   dextromethorphan-guaiFENesin 10-100 MG/5ML liquid Commonly known as: ROBITUSSIN-DM Take 10 mLs by mouth every 6 (six) hours as needed for cough.   DULoxetine 60 MG capsule Commonly known as: CYMBALTA Take 60 mg by mouth daily.   Farxiga 5 MG Tabs tablet Generic drug: dapagliflozin propanediol Take 5 mg by mouth daily.   ferrous sulfate 325 (65 FE) MG EC tablet Take 1 tablet (325 mg total) by mouth every other day. What changed: when to take this   furosemide 20 MG tablet Commonly known as: Lasix Take 1 tablet (20 mg total) by mouth every other day. Monitor volume status and change dose accordingly.   Glucagon Emergency 1 MG Kit Inject 1 g into the vein as needed (hypoglycemia).   insulin aspart 100 UNIT/ML injection Commonly known as: novoLOG Inject 5 Units into the skin 3 (three) times daily with meals. What changed: Another medication with the same name was added. Make sure you understand how and when to take each.   insulin aspart 100 UNIT/ML injection Commonly known as: novoLOG Inject 0-9 Units into the  skin 3 (three) times daily with meals. What changed: Another medication with the same name was added. Make sure you understand how and when to take each.   insulin aspart 100 UNIT/ML injection Commonly known as: novoLOG Inject 0-9 Units into the skin 3 (three) times daily with meals. What changed: You were already taking a medication with the same name, and this prescription was added. Make sure you understand how and when to take each.   Lantus SoloStar 100 UNIT/ML Solostar Pen Generic drug: insulin glargine Inject 24 Units into the skin at bedtime. What changed: how much to take   levothyroxine 75 MCG tablet Commonly known as: SYNTHROID Take 75 mcg by mouth at bedtime. 2200   melatonin 5 MG Tabs Take 5 mg by mouth at bedtime.   multivitamin with minerals Tabs tablet Take 1 tablet by mouth daily.   ondansetron 4 MG tablet Commonly known as: ZOFRAN Take 4 mg by  mouth every 8 (eight) hours as needed for nausea or vomiting.   polyethylene glycol 17 g packet Commonly known as: MIRALAX / GLYCOLAX Take 17 g by mouth daily as needed for mild constipation.   pregabalin 50 MG capsule Commonly known as: LYRICA Take 50 mg by mouth 2 (two) times daily. What changed: Another medication with the same name was removed. Continue taking this medication, and follow the directions you see here.   sacubitril-valsartan 24-26 MG Commonly known as: ENTRESTO Take 1 tablet by mouth 2 (two) times daily.   senna-docusate 8.6-50 MG tablet Commonly known as: Senokot-S Take 1 tablet by mouth 2 (two) times daily. Hold if having loose or frequent stools. What changed: when to take this   sulfamethoxazole-trimethoprim 800-160 MG tablet Commonly known as: Bactrim DS Take 1 tablet by mouth 2 (two) times daily for 5 days.   traZODone 50 MG tablet Commonly known as: DESYREL Take 1 tablet (50 mg total) by mouth at bedtime as needed for sleep.        Contact information for after-discharge care      Destination     [facility] .   Service: Skilled Nursing Contact information: [address] [place] [address] [phone]                    Discharge Exam: Ceasar Mons Weights   06/05/23 1613  Weight: 102.1 kg   Constitutional:  Normal appearance. Non toxic-appearing.  HENT: Head Normocephalic and atraumatic.  Mucous membranes are moist.  Eyes:  Extraocular intact. Conjunctivae normal. Pupils are equal, round, and reactive to light.  Cardiovascular: Normal rate and regular rhythm.  Pulmonary: Non labored, symmetric rise of chest wall.  Musculoskeletal:  Normal range of motion.  Skin: warm and dry. not jaundiced.  Neurological: No focal deficit present. alert. Oriented. Psychiatric: Mood and Affect congruent.    Condition at discharge: stable  The results of significant diagnostics from this hospitalization (including imaging, microbiology, ancillary and laboratory) are listed below for reference.   Imaging Studies: DG Chest Port 1 View Result Date: 06/05/2023 CLINICAL DATA:  [id number] Sepsis (HCC) [id number].  Cough EXAM: PORTABLE CHEST 1 VIEW COMPARISON:  04/07/2023 FINDINGS: The heart size and mediastinal contours are stable. Mildly prominent bibasilar interstitial markings, more pronounced on the left. No large pleural fluid collection. No pneumothorax. IMPRESSION: Mildly prominent bibasilar interstitial markings, more pronounced on the left, which may reflect atelectasis versus developing infiltrate. Electronically Signed   By: Duanne Guess D.O.   On: 06/05/2023 18:51    Microbiology: Results for orders placed or performed during the hospital encounter of 06/05/23  Culture, blood (Routine x 2)     Status: None (Preliminary result)   Collection Time: 06/05/23  4:35 PM   Specimen: BLOOD  Result Value Ref Range Status   Specimen Description   Final    BLOOD BLOOD LEFT HAND Performed at [facility], [address] [place], [place] [address]    Special Requests   Final    BOTTLES DRAWN AEROBIC ONLY Blood Culture adequate volume Performed at [facility], [address], [place], [place] [address]    Culture   Final    NO GROWTH 2 DAYS Performed at [facility], [address], [place], [place] [address]    Report Status PENDING  Incomplete  Culture, blood (Routine x 2)     Status: None (Preliminary result)   Collection Time: 06/05/23  5:10  PM   Specimen: BLOOD  Result Value Ref Range Status   Specimen Description   Final    BLOOD BLOOD LEFT HAND Performed at [facility], [address], [place], [place] [address]    Special Requests   Final    BOTTLES DRAWN AEROBIC AND ANAEROBIC Blood Culture results may not be optimal due to an inadequate volume of blood received in culture bottles Performed at [facility], [address] [place], [place] [address]    Culture   Final    NO GROWTH 2 DAYS Performed at [facility], [address], [place], [place] [address]    Report Status PENDING  Incomplete  Resp panel by RT-PCR (RSV, Flu A&B, Covid) Anterior Nasal Swab     Status: None   Collection Time: 06/05/23  6:33 PM   Specimen: Anterior Nasal Swab  Result Value Ref Range Status   SARS Coronavirus 2 by RT PCR NEGATIVE NEGATIVE Final    Comment: (NOTE) SARS-CoV-2 target nucleic acids are NOT DETECTED.  The SARS-CoV-2 RNA is generally detectable in upper respiratory specimens during the acute phase of infection. The lowest concentration of SARS-CoV-2 viral copies this assay can detect is 138 copies/mL. A negative result does not preclude SARS-Cov-2 infection and should not be used as the sole basis for treatment or other patient management decisions. A negative result may occur with  improper specimen collection/handling, submission of specimen other than nasopharyngeal swab, presence of viral mutation(s) within the areas  targeted by this assay, and inadequate number of viral copies(<138 copies/mL). A negative result must be combined with clinical observations, patient history, and epidemiological information. The expected result is Negative.  Fact Sheet for Patients:  BloggerCourse.com  Fact Sheet for Healthcare Providers:  SeriousBroker.it  This test is no t yet approved or cleared by the [place] FDA and  has been authorized for detection and/or diagnosis of SARS-CoV-2 by FDA under an Emergency Use Authorization (EUA). This EUA will remain  in effect (meaning this test can be used) for the duration of the COVID-19 declaration under Section 564(b)(1) of the Act, 21 U.S.C.section 360bbb-3(b)(1), unless the authorization is terminated  or revoked sooner.       Influenza A by PCR NEGATIVE NEGATIVE Final   Influenza B by PCR NEGATIVE NEGATIVE Final    Comment: (NOTE) The Xpert Xpress SARS-CoV-2/FLU/RSV plus assay is intended as an aid in the diagnosis of influenza from Nasopharyngeal swab specimens and should not be used as a sole basis for treatment. Nasal washings and aspirates are unacceptable for Xpert Xpress SARS-CoV-2/FLU/RSV testing.  Fact Sheet for Patients: BloggerCourse.com  Fact Sheet for Healthcare Providers: SeriousBroker.it  This test is not yet approved or cleared by the [place] FDA and has been authorized for detection and/or diagnosis of SARS-CoV-2 by FDA under an Emergency Use Authorization (EUA). This EUA will remain in effect (meaning this test can be used) for the duration of the COVID-19 declaration under Section 564(b)(1) of the Act, 21 U.S.C. section 360bbb-3(b)(1), unless the authorization is terminated or revoked.     Resp Syncytial Virus by PCR NEGATIVE NEGATIVE Final    Comment: (NOTE) Fact Sheet for  Patients: BloggerCourse.com  Fact Sheet for Healthcare Providers: SeriousBroker.it  This test is not yet approved or cleared by the [place] FDA and has been authorized for detection and/or diagnosis of SARS-CoV-2 by FDA under an Emergency Use Authorization (EUA). This EUA will remain in effect (meaning this test can  be used) for the duration of the COVID-19 declaration under Section 564(b)(1) of the Act, 21 U.S.C. section 360bbb-3(b)(1), unless the authorization is terminated or revoked.  Performed at [facility], [address] [place], [place] [address]   Urine Culture     Status: Abnormal   Collection Time: 06/05/23  6:33 PM   Specimen: Urine, Random  Result Value Ref Range Status   Specimen Description   Final    URINE, RANDOM Performed at [facility], [address], [place], [place] [address]    Special Requests   Final    NONE Reflexed from [id number] Performed at [facility], [address] [place], [place] [address]    Culture (A)  Final    <10,000 COLONIES/mL INSIGNIFICANT GROWTH Performed at [facility], [address] [place], [place] [address]    Report Status 06/06/2023 FINAL  Final    Labs: CBC: Recent Labs  Lab 06/05/23 1710 06/06/23 0808 06/07/23 0653  WBC 5.2 4.4 4.4  NEUTROABS 2.9  --   --   HGB 10.9* 9.8* 10.7*  HCT 32.0* 30.0* 32.8*  MCV 88.4 90.4 89.6  PLT 258 196 211   Basic Metabolic Panel: Recent Labs  Lab 06/05/23 1641 06/06/23 0808 06/07/23 0653  NA 132* 135 134*  K 3.9 3.6 3.5  CL 101 104 103  CO2 21* 21* 20*  GLUCOSE 116* 98 143*  BUN 25* 23 23  CREATININE 1.31* 1.23 1.26*  CALCIUM 8.8* 8.6* 8.5*   Liver Function Tests: Recent Labs  Lab 06/05/23 1641  AST 30  ALT 14  ALKPHOS 83  BILITOT 0.9  PROT 8.0  ALBUMIN 3.7   CBG: Recent Labs  Lab 06/06/23 1223 06/06/23 1731 06/06/23 2222  06/07/23 0737 06/07/23 1224  GLUCAP 92 118* 100* 140* 183*    Discharge time spent: 31 minutes.  Signed: Debarah Crape, DO [facility] 06/07/2023

## 2023-06-07 NOTE — Evaluation (Signed)
 Physical Therapy Evaluation Patient Details Name: Steve Alvarado MRN: [id number] DOB: 1955/11/10 Today's Date: 06/07/2023  History of Present Illness  68 yo male presents to therapy following hospital admission on 06/05/2023 due to fever, poor PO intake and cough. Pt was found to have UTI. Pt has PMH including but not limited to: AKI, CVA (2023), CHF, CKD III, CAD, depression, DM II, substance abuse, HTN, and HLD.  Clinical Impression    Pt admitted with above diagnosis.  Pt currently with functional limitations due to the deficits listed below (see PT Problem List). Pt in bed when PT arrived. Pt agreeable to therapy intervention. Pt is motivated to return to SNF for continued therapy and states he was working on walking with a HW. Pt required mod/max A for supine to sit with use of hospital bed, mod A for scoot transfer bed to recliner to R side and min A x 2 for pull to stand from recliner and min A to maintain static standing with R UE support for ~ 90s with L knee and foot blocked. Pt left seated in recliner, nursing staff present and all needs in place. Patient will benefit from continued inpatient follow up therapy, <3 hours/day. Pt will benefit from acute skilled PT to increase their independence and safety with mobility to allow discharge.         If plan is discharge home, recommend the following: Two people to help with walking and/or transfers;A lot of help with bathing/dressing/bathroom;Assistance with cooking/housework;Direct supervision/assist for financial management;Assist for transportation;Help with stairs or ramp for entrance;Direct supervision/assist for medications management   Can travel by private vehicle        Equipment Recommendations None recommended by PT  Recommendations for Other Services       Functional Status Assessment Patient has had a recent decline in their functional status and demonstrates the ability to make significant improvements in function in a  reasonable and predictable amount of time.     Precautions / Restrictions Precautions Precautions: Fall Restrictions Weight Bearing Restrictions Per Provider Order: No      Mobility  Bed Mobility Overal bed mobility: Needs Assistance Bed Mobility: Supine to Sit     Supine to sit: Mod assist, HOB elevated, Used rails     General bed mobility comments: increased time and cues, mod A for L LE to EOB and mod/max A for trunk    Transfers Overall transfer level: Needs assistance Equipment used: None Transfers: Sit to/from Stand, Bed to chair/wheelchair/BSC Sit to Stand: Min assist, +2 physical assistance, +2 safety/equipment (pull to stand at foot board of elevated bed)          Lateral/Scoot Transfers: Mod assist, From elevated surface General transfer comment: cues, increased time, A for L LE management and pt si able to scoot to the R side with CGA and required mod A to complete transfer from elevated EOB to recliner to the R side with arm rest down. pull to stand with min A x 2 and min A to maintain standing balance, L foot and knee blocked when transitioning sit to stand and when in standing min cues for posture and standing tolerance ~ 90s    Ambulation/Gait               General Gait Details: NT  Stairs            Wheelchair Mobility     Tilt Bed    Modified Rankin (Stroke Patients Only)  Balance Overall balance assessment: Needs assistance Sitting-balance support: Feet supported, Single extremity supported Sitting balance-Leahy Scale: Fair     Standing balance support: During functional activity, Reliant on assistive device for balance, Single extremity supported Standing balance-Leahy Scale: Poor                               Pertinent Vitals/Pain Pain Assessment Pain Assessment: 0-10 Pain Score: 7  Pain Location: LBP Pain Descriptors / Indicators: Aching, Constant, Discomfort, Dull, Grimacing Pain Intervention(s):  Limited activity within patient's tolerance, Monitored during session, Repositioned    Home Living Family/patient expects to be discharged to:: Skilled nursing facility                   Additional Comments: prior to SNF pt was living with family members in a level entry home. pt had caregiver support 3 hr/day    Prior Function Prior Level of Function : Needs assist             Mobility Comments: pt reports mod I for wc mobiltiy with R UE and LE, pt required A for functional transfer tasks ADLs Comments: pt requires A for all ADLs, self care needs and IADLs     Extremity/Trunk Assessment   Upper Extremity Assessment Upper Extremity Assessment:  (L UE flaccid following CVA, hand flexion contracture noted)    Lower Extremity Assessment Lower Extremity Assessment: LLE deficits/detail LLE Deficits / Details: late effects CVA impacing L LE motor control and coordination with pt grossly 2/5 throughout, R LE WFL LLE Sensation: decreased proprioception    Cervical / Trunk Assessment Cervical / Trunk Assessment: Normal  Communication   Communication Communication: No apparent difficulties    Cognition Arousal: Alert Behavior During Therapy: WFL for tasks assessed/performed   PT - Cognitive impairments: No apparent impairments                         Following commands: Intact       Cueing       General Comments      Exercises     Assessment/Plan    PT Assessment Patient needs continued PT services  PT Problem List Decreased strength;Decreased range of motion;Decreased activity tolerance;Decreased balance;Decreased mobility;Decreased coordination;Pain       PT Treatment Interventions Functional mobility training;Therapeutic activities;Therapeutic exercise;Balance training;Neuromuscular re-education;Patient/family education;Wheelchair mobility training    PT Goals (Current goals can be found in the Care Plan section)  Acute Rehab PT  Goals Patient Stated Goal: to get back home PT Goal Formulation: With patient Time For Goal Achievement: 06/28/23 Potential to Achieve Goals: Good    Frequency Min 2X/week     Co-evaluation               AM-PAC PT "6 Clicks" Mobility  Outcome Measure Help needed turning from your back to your side while in a flat bed without using bedrails?: A Little Help needed moving from lying on your back to sitting on the side of a flat bed without using bedrails?: A Lot Help needed moving to and from a bed to a chair (including a wheelchair)?: A Lot Help needed standing up from a chair using your arms (e.g., wheelchair or bedside chair)?: A Lot Help needed to walk in hospital room?: Total Help needed climbing 3-5 steps with a railing? : Total 6 Click Score: 11    End of Session Equipment Utilized During Treatment: Gait  belt Activity Tolerance: Patient limited by fatigue;Patient limited by pain Patient left: in chair;with call bell/phone within reach;with nursing/sitter in room Nurse Communication: Mobility status PT Visit Diagnosis: Unsteadiness on feet (R26.81);Other abnormalities of gait and mobility (R26.89);Muscle weakness (generalized) (M62.81);Difficulty in walking, not elsewhere classified (R26.2);Other symptoms and signs involving the nervous system (R29.898);Hemiplegia and hemiparesis;Pain Hemiplegia - Right/Left: Left Hemiplegia - dominant/non-dominant: Non-dominant Hemiplegia - caused by: Cerebral infarction Pain - Right/Left:  (LBP)    Time: 4696-2952 PT Time Calculation (min) (ACUTE ONLY): 26 min   Charges:   PT Evaluation $PT Eval Low Complexity: 1 Low PT Treatments $Therapeutic Activity: 8-22 mins PT General Charges $$ ACUTE PT VISIT: 1 Visit         Johnny Bridge, PT Acute Rehab   Jacqualyn Posey 06/07/2023, 2:43 PM

## 2023-06-07 NOTE — TOC Initial Note (Signed)
 Transition of Care [facility]) - Initial/Assessment Note    Patient Details  Name: Steve Alvarado MRN: [id number] Date of Birth: April 05, 1955  Transition of Care [facility]) CM/SW Contact:    Lanier Clam, RN Phone Number: 06/07/2023, 11:13 AM  Clinical Narrative: Spoke to [name](spouse), & [facility] rep-confirmed from [place]-LTC d/c plan to return-able to transfer with w/c. PT eval & recc prior fl2.                 Expected Discharge Plan: Long Term Nursing Home Barriers to Discharge: Continued Medical Work up   Patient Goals and CMS Choice Patient states their goals for this hospitalization and ongoing recovery are:: Return [facility] CMS Medicare.gov Compare Post Acute Care list provided to:: Patient Represenative (must comment) (spouse [name]) Choice offered to / list presented to : Spouse [place] ownership interest in [facility].provided to:: Spouse    Expected Discharge Plan and Services   Discharge Planning Services: CM Consult   Living arrangements for the past 2 months: Skilled Nursing Facility                                      Prior Living Arrangements/Services Living arrangements for the past 2 months: Skilled Nursing Facility Lives with:: Facility Resident              Current home services: DME (w/c)    Activities of Daily Living   ADL Screening (condition at time of admission) Independently performs ADLs?: No Does the patient have a NEW difficulty with bathing/dressing/toileting/self-feeding that is expected to last >3 days?: No Does the patient have a NEW difficulty with getting in/out of bed, walking, or climbing stairs that is expected to last >3 days?: No Does the patient have a NEW difficulty with communication that is expected to last >3 days?: No Is the patient deaf or have difficulty hearing?: No Does the patient have difficulty seeing, even when wearing glasses/contacts?: No Does the patient have difficulty  concentrating, remembering, or making decisions?: No  Permission Sought/Granted                  Emotional Assessment              Admission diagnosis:  Urinary tract infection [N39.0] Sepsis secondary to UTI (HCC) [A41.9, N39.0] Fever, unspecified fever cause [R50.9] Urinary tract infection without hematuria, site unspecified [N39.0] Pneumonia of left lower lobe due to infectious organism [J18.9] Sepsis, due to unspecified organism, unspecified whether acute organ dysfunction present [facility]) [A41.9] Patient Active Problem List   Diagnosis Date Noted   Sepsis secondary to UTI (HCC) 06/05/2023   Acute metabolic encephalopathy 03/26/2023   Hyperkalemia 03/26/2023   Anemia of chronic disease 03/26/2023   History of CAD (coronary artery disease) 03/26/2023   Insulin dependent type 2 diabetes mellitus (HCC) 03/26/2023   History of depression 03/26/2023   History of CVA (cerebrovascular accident) 03/26/2023   Malnutrition of moderate degree 09/01/2022   UTI (urinary tract infection) 08/29/2022   Chronic anemia 08/28/2022   Unable to care for self 08/28/2022   Intractable diarrhea 07/26/2022   E. coli UTI 07/26/2022   Uncontrolled type 2 diabetes mellitus with hyperglycemia, with long-term current use of insulin (HCC) 07/26/2022   Chronic diastolic CHF (congestive heart failure) (HCC) 07/26/2022   Coronary artery disease 07/26/2022   GERD with esophagitis 07/26/2022   Demand ischemia (HCC)    Urinary  tract infection, recurrent 11/08/2021   NSTEMI, Type ll (non-ST elevated myocardial infarction) (HCC) 11/07/2021   Acute on chronic heart failure with preserved ejection fraction (HFpEF) (HCC) 11/07/2021   Hematuria 11/07/2021   Acute on chronic blood loss anemia 11/07/2021   Hemiparesis affecting left side as late effect of cerebrovascular accident (CVA) (HCC) 11/07/2021   Acute respiratory failure with hypoxia (HCC) 11/07/2021   CKD stage 3a, GFR 45-59 ml/min (HCC) 08/21/2021    GERD without esophagitis 08/20/2021   Basal ganglia stroke (HCC) 08/12/2021   Type 2 diabetes mellitus with diabetic polyneuropathy, with long-term current use of insulin (HCC) 07/09/2021   Hypothyroidism 07/09/2021   Depression 10/21/2020   Non compliance w medication regimen 10/21/2020   Cocaine abuse (HCC) 09/05/2020   Adjustment disorder with emotional disturbance 09/05/2020   Hypertensive urgency 03/08/2015   Essential hypertension 02/14/2011   Coronary artery disease involving native coronary artery of native heart without angina pectoris 08/13/2010   Stented coronary artery 08/13/2010   PCP:  Shelby Mattocks, MD Pharmacy:   [facility] [address] ([address]), [place] - [address] [place] ([place]) [place] [address] Phone: [phone] Fax: [phone]     Social Drivers of Health (SDOH) Social History: SDOH Screenings   Food Insecurity: No Food Insecurity (06/06/2023)  Housing: Low Risk  (06/06/2023)  Transportation Needs: No Transportation Needs (06/06/2023)  Utilities: Not At Risk (06/06/2023)  Depression (PHQ2-9): Low Risk  (12/31/2021)  Financial Resource Strain: High Risk (10/26/2020)   Received from [facility]  Social Connections: Unknown (06/06/2023)  Tobacco Use: Low Risk  (06/05/2023)   SDOH Interventions:     Readmission Risk Interventions    11/08/2021   11:09 AM  Readmission Risk Prevention Plan  Transportation Screening Complete  Medication Review (RN Care Manager) Complete  PCP or Specialist appointment within 3-5 days of discharge Complete  SW Recovery Care/Counseling Consult Complete  Palliative Care Screening Not Applicable  Skilled Nursing Facility Complete

## 2023-06-07 NOTE — [place] FL2 (Signed)
 [place] MEDICAID FL2 LEVEL OF CARE FORM     IDENTIFICATION  Patient Name: Steve Alvarado Birthdate: Dec 23, 1955 Sex: male Admission Date (Current Location): 06/05/2023  [facility] and [place] Number:      Facility and Address:  [facility],  [address] [place], [place] [address]      Provider Number: [id number]  Attending Physician Name and Address:  Debarah Crape, DO  Relative Name and Phone Number:  Johncarlos [place]) [phone]    Current Level of Care: Hospital Recommended Level of Care: Nursing Facility Prior Approval Number:    Date Approved/Denied:   PASRR Number:    Discharge Plan: Other (Comment) [facility])    Current Diagnoses: Patient Active Problem List   Diagnosis Date Noted   Sepsis secondary to UTI (HCC) 06/05/2023   Acute metabolic encephalopathy 03/26/2023   Hyperkalemia 03/26/2023   Anemia of chronic disease 03/26/2023   History of CAD (coronary artery disease) 03/26/2023   Insulin dependent type 2 diabetes mellitus (HCC) 03/26/2023   History of depression 03/26/2023   History of CVA (cerebrovascular accident) 03/26/2023   Malnutrition of moderate degree 09/01/2022   UTI (urinary tract infection) 08/29/2022   Chronic anemia 08/28/2022   Unable to care for self 08/28/2022   Intractable diarrhea 07/26/2022   E. coli UTI 07/26/2022   Uncontrolled type 2 diabetes mellitus with hyperglycemia, with long-term current use of insulin (HCC) 07/26/2022   Chronic diastolic CHF (congestive heart failure) (HCC) 07/26/2022   Coronary artery disease 07/26/2022   GERD with esophagitis 07/26/2022   Demand ischemia (HCC)    Urinary tract infection, recurrent 11/08/2021   NSTEMI, Type ll (non-ST elevated myocardial infarction) (HCC) 11/07/2021   Acute on chronic heart failure with preserved ejection fraction (HFpEF) (HCC) 11/07/2021   Hematuria 11/07/2021   Acute on chronic blood loss anemia 11/07/2021   Hemiparesis affecting left  side as late effect of cerebrovascular accident (CVA) (HCC) 11/07/2021   Acute respiratory failure with hypoxia (HCC) 11/07/2021   CKD stage 3a, GFR 45-59 ml/min (HCC) 08/21/2021   GERD without esophagitis 08/20/2021   Basal ganglia stroke (HCC) 08/12/2021   Type 2 diabetes mellitus with diabetic polyneuropathy, with long-term current use of insulin (HCC) 07/09/2021   Hypothyroidism 07/09/2021   Depression 10/21/2020   Non compliance w medication regimen 10/21/2020   Cocaine abuse (HCC) 09/05/2020   Adjustment disorder with emotional disturbance 09/05/2020   Hypertensive urgency 03/08/2015   Essential hypertension 02/14/2011   Coronary artery disease involving native coronary artery of native heart without angina pectoris 08/13/2010   Stented coronary artery 08/13/2010    Orientation RESPIRATION BLADDER Height & Weight     Self, Time, Situation  Normal Continent Weight: 102.1 kg Height:  6\' 2"  (188 cm)  BEHAVIORAL SYMPTOMS/MOOD NEUROLOGICAL BOWEL NUTRITION STATUS      Continent Diet (Regular)  AMBULATORY STATUS COMMUNICATION OF NEEDS Skin   Extensive Assist   Normal, Other (Comment), PU Stage and Appropriate Care (see wound care d/c summary)                       Personal Care Assistance Level of Assistance  Bathing, Feeding, Dressing Bathing Assistance: Maximum assistance Feeding assistance: Limited assistance Dressing Assistance: Maximum assistance     Functional Limitations Info  Sight, Hearing, Speech Sight Info: Impaired (readers) Hearing Info: Adequate Speech Info: Impaired (Dentures-top/bottom)    SPECIAL CARE FACTORS FREQUENCY  Contractures Contractures Info: Not present    Additional Factors Info  Code Status, Allergies Code Status Info: Full Allergies Info: Ace Inhibitors           Current Medications (06/07/2023):  This is the current hospital active medication list Current Facility-Administered Medications   Medication Dose Route Frequency Provider Last Rate Last Admin   acetaminophen (TYLENOL) tablet 650 mg  650 mg Oral Q6H PRN Opyd, Lavone Neri, MD       Or   acetaminophen (TYLENOL) suppository 650 mg  650 mg Rectal Q6H PRN Opyd, Lavone Neri, MD       amLODipine (NORVASC) tablet 10 mg  10 mg Oral q morning Opyd, Lavone Neri, MD   10 mg at 06/07/23 0900   aspirin EC tablet 81 mg  81 mg Oral Daily Opyd, Lavone Neri, MD   81 mg at 06/07/23 0900   atorvastatin (LIPITOR) tablet 40 mg  40 mg Oral q1800 Opyd, Lavone Neri, MD   40 mg at 06/06/23 1759   carvedilol (COREG) tablet 6.25 mg  6.25 mg Oral BID WC Opyd, Lavone Neri, MD   6.25 mg at 06/07/23 0857   cefTRIAXone (ROCEPHIN) 2 g in sodium chloride 0.9 % 100 mL IVPB  2 g Intravenous Q24H Opyd, Lavone Neri, MD 200 mL/hr at 06/07/23 0203 2 g at 06/07/23 0203   clopidogrel (PLAVIX) tablet 75 mg  75 mg Oral Daily Opyd, Lavone Neri, MD   75 mg at 06/07/23 0900   DULoxetine (CYMBALTA) DR capsule 60 mg  60 mg Oral Daily Opyd, Lavone Neri, MD   60 mg at 06/07/23 0900   enoxaparin (LOVENOX) injection 40 mg  40 mg Subcutaneous Q24H Opyd, Lavone Neri, MD   40 mg at 06/06/23 2211   guaiFENesin (ROBITUSSIN) 100 MG/5ML liquid 5 mL  5 mL Oral Q4H PRN Opyd, Lavone Neri, MD   5 mL at 06/05/23 2218   insulin aspart (novoLOG) injection 0-9 Units  0-9 Units Subcutaneous TID WC Opyd, Lavone Neri, MD   2 Units at 06/07/23 1300   insulin aspart (novoLOG) injection 5 Units  5 Units Subcutaneous TID WC Opyd, Lavone Neri, MD   5 Units at 06/07/23 1300   insulin glargine (LANTUS) injection 25 Units  25 Units Subcutaneous QHS Leeroy Bock, MD       levothyroxine (SYNTHROID) tablet 75 mcg  75 mcg Oral QHS Briscoe Deutscher, MD   75 mcg at 06/06/23 2211   liver oil-zinc oxide (DESITIN) 40 % ointment   Topical BID Dezii, Alexandra, DO       melatonin tablet 5 mg  5 mg Oral QHS Opyd, Lavone Neri, MD   5 mg at 06/06/23 2211   ondansetron (ZOFRAN) tablet 4 mg  4 mg Oral Q6H PRN Opyd, Lavone Neri, MD       Or    ondansetron (ZOFRAN) injection 4 mg  4 mg Intravenous Q6H PRN Opyd, Lavone Neri, MD       oxyCODONE (Oxy IR/ROXICODONE) immediate release tablet 5 mg  5 mg Oral Q4H PRN Opyd, Lavone Neri, MD   5 mg at 06/07/23 0900   pregabalin (LYRICA) capsule 50 mg  50 mg Oral BID Leeroy Bock, MD   50 mg at 06/07/23 0900   sacubitril-valsartan (ENTRESTO) 24-26 mg per tablet  1 tablet Oral BID Opyd, Lavone Neri, MD   1 tablet at 06/07/23 0900   sodium chloride flush (NS) 0.9 % injection 3 mL  3 mL Intravenous Q12H Opyd,  Lavone Neri, MD   3 mL at 06/07/23 0907   traZODone (DESYREL) tablet 50 mg  50 mg Oral QHS PRN Opyd, Lavone Neri, MD         Discharge Medications: Please see discharge summary for a list of discharge medications.  Relevant Imaging Results:  Relevant Lab Results:   Additional Information ss#[id number] [place], [place]

## 2023-06-07 NOTE — Hospital Course (Signed)
 Steve Alvarado is 68 y.o. male with hypertension, insulin-dependent diabetes, CKD stage IIIa, CAD, chronic heart failure with preserved EF, hypothyroidism, depression, prior CVA with residual deficits, presents with fever, loss of appetite, cough, dysuria.  In the ED he was found to be febrile to 103, with pyuria, bacteriuria, and hyponatremia.  Started on broad-spectrum antibiotics and admitted.  Urine cultures had insignificant growth.  Sepsis was ruled out.  Blood cultures were negative to date.  Patient was then transition to Bactrim.  By reevaluation on 3/19 he reports feeling much improved and back to baseline.  Will be discharging back to his nursing facility today.   Urinary tract infection - Complete Bactrim outpatient for 7 days total therapy. - Cultures with insignificant growth - Blood cultures negative - No further fever - Sepsis has been ruled out   Hypertensive urgency Blood pressure improved now - BP 205/92 without endorgan damage - Continue Coreg, Norvasc, Entresto.     CAD - No anginal symptoms - Continue aspirin, Plavix, Lipitor, beta-blocker   History of CVA - Continue home meds   CKD stage IIIa - Appears close to baseline now - Renally dose with creatinine clearance of 70 when needed - Avoid nephrotoxic meds   Insulin-dependent diabetes mellitus, uncontrolled - Hemoglobin A1c 10.2% - Continue basal/bolus insulin, titrate up as needed   Chronic heart failure with preserved EF - Clinically compensated at this time - Continue home meds - Follow I's and O's   Depression - Continue home meds Cymbalta and trazodone   BMI 28 - Outpatient follow up for lifestyle modification and risk factor management   Hyponatremia - 132 on arrival, gradually resolving.  134 now.

## 2023-06-07 NOTE — Progress Notes (Signed)
 Packed all belongings and sent with pt via PTAR. AVS in packet.

## 2023-06-07 NOTE — Consult Note (Signed)
 WOC Nurse Consult Note: Reason for Consult: multiple skin injuries  Wound type: 1. Moisture Associated Skin Damage linear to intergluteal cleft ICD-10 CM Codes for Irritant Dermatitis L24A0 - Due to friction or contact with body fluids; unspecified 2. Stage 2 Pressure Injury buttock  3.  Unstageable PI to B heels 4 Full thickness R lower leg/foot  Pressure Injury POA: Yes Measurement: see nursing flowsheet  Wound bed: 1.  MASD intergluteal cleft 100% pink moist  2. Stage 2 buttock 100% red  3.  B heels with mix black eschar and brown necrotic tissue (appears dry)  4.  Full thickness R lower leg/foot 100% dark hemorrhagic material  Drainage (amount, consistency, odor) see nursing flowsheet, heels and foot appear dry  Periwound: callused and peeling skin heels Dressing procedure/placement/frequency:  Cleanse buttocks/intergluteal cleft (in between buttocks) with soap and water, dry and apply a thin layer of Desitin 2 times a day.  Cover with silicone foam.  Cleanse B heels with soap and water, dry and apply Xeroform gauze [place] [phone]) to wound beds daily. Secure with dry gauze and Kerlix roll gauze or silicone foam. Place bilateral heels in Prevalon boots to offload pressure  [place] [phone]).  Cleanse R lower leg/foot wound with NS, apply Xeroform gauze [place] [phone]) to wound bed daily, cover with silicone foam. May lift silicone foam daily to replace Xeroform. Change foam q3 days and prn soiling.  POC discussed with bedside nurse. WOC team will not follow. Re-consult if further needs arise.   Thank you,    Priscella Mann MSN, RN-BC, [employer] [phone]

## 2023-06-10 LAB — CULTURE, BLOOD (ROUTINE X 2)
Culture: NO GROWTH
Culture: NO GROWTH
Special Requests: ADEQUATE

## 2023-08-15 ENCOUNTER — Encounter (INDEPENDENT_AMBULATORY_CARE_PROVIDER_SITE_OTHER): Admitting: Ophthalmology

## 2023-08-15 DIAGNOSIS — H2511 Age-related nuclear cataract, right eye: Secondary | ICD-10-CM

## 2023-08-15 DIAGNOSIS — H43813 Vitreous degeneration, bilateral: Secondary | ICD-10-CM

## 2023-08-15 DIAGNOSIS — H4311 Vitreous hemorrhage, right eye: Secondary | ICD-10-CM

## 2023-08-15 DIAGNOSIS — Z794 Long term (current) use of insulin: Secondary | ICD-10-CM

## 2023-08-15 DIAGNOSIS — E113513 Type 2 diabetes mellitus with proliferative diabetic retinopathy with macular edema, bilateral: Secondary | ICD-10-CM | POA: Diagnosis not present

## 2023-08-15 DIAGNOSIS — I10 Essential (primary) hypertension: Secondary | ICD-10-CM

## 2023-08-15 DIAGNOSIS — H35033 Hypertensive retinopathy, bilateral: Secondary | ICD-10-CM

## 2023-09-25 ENCOUNTER — Other Ambulatory Visit: Payer: Self-pay

## 2023-09-25 ENCOUNTER — Inpatient Hospital Stay (HOSPITAL_COMMUNITY)
Admission: EM | Admit: 2023-09-25 | Discharge: 2023-10-12 | DRG: 616 | Disposition: A | Discharge status: Skilled Nursing Facility | Source: Skilled Nursing Facility | Attending: Internal Medicine | Admitting: Internal Medicine

## 2023-09-25 ENCOUNTER — Inpatient Hospital Stay (HOSPITAL_COMMUNITY)

## 2023-09-25 ENCOUNTER — Encounter (HOSPITAL_COMMUNITY): Payer: Self-pay

## 2023-09-25 ENCOUNTER — Emergency Department (HOSPITAL_COMMUNITY)

## 2023-09-25 DIAGNOSIS — G4700 Insomnia, unspecified: Secondary | ICD-10-CM | POA: Diagnosis present

## 2023-09-25 DIAGNOSIS — L89151 Pressure ulcer of sacral region, stage 1: Secondary | ICD-10-CM | POA: Diagnosis present

## 2023-09-25 DIAGNOSIS — Z7902 Long term (current) use of antithrombotics/antiplatelets: Secondary | ICD-10-CM

## 2023-09-25 DIAGNOSIS — I96 Gangrene, not elsewhere classified: Secondary | ICD-10-CM | POA: Diagnosis not present

## 2023-09-25 DIAGNOSIS — N179 Acute kidney failure, unspecified: Secondary | ICD-10-CM | POA: Diagnosis present

## 2023-09-25 DIAGNOSIS — E1122 Type 2 diabetes mellitus with diabetic chronic kidney disease: Secondary | ICD-10-CM | POA: Diagnosis present

## 2023-09-25 DIAGNOSIS — M86272 Subacute osteomyelitis, left ankle and foot: Secondary | ICD-10-CM | POA: Diagnosis not present

## 2023-09-25 DIAGNOSIS — G928 Other toxic encephalopathy: Secondary | ICD-10-CM | POA: Diagnosis present

## 2023-09-25 DIAGNOSIS — N1831 Chronic kidney disease, stage 3a: Secondary | ICD-10-CM | POA: Diagnosis present

## 2023-09-25 DIAGNOSIS — G894 Chronic pain syndrome: Secondary | ICD-10-CM | POA: Diagnosis present

## 2023-09-25 DIAGNOSIS — E785 Hyperlipidemia, unspecified: Secondary | ICD-10-CM | POA: Diagnosis present

## 2023-09-25 DIAGNOSIS — M869 Osteomyelitis, unspecified: Secondary | ICD-10-CM | POA: Diagnosis not present

## 2023-09-25 DIAGNOSIS — F32A Depression, unspecified: Secondary | ICD-10-CM | POA: Diagnosis present

## 2023-09-25 DIAGNOSIS — Z8249 Family history of ischemic heart disease and other diseases of the circulatory system: Secondary | ICD-10-CM

## 2023-09-25 DIAGNOSIS — I69354 Hemiplegia and hemiparesis following cerebral infarction affecting left non-dominant side: Secondary | ICD-10-CM

## 2023-09-25 DIAGNOSIS — I5032 Chronic diastolic (congestive) heart failure: Secondary | ICD-10-CM | POA: Diagnosis present

## 2023-09-25 DIAGNOSIS — I10 Essential (primary) hypertension: Secondary | ICD-10-CM | POA: Diagnosis not present

## 2023-09-25 DIAGNOSIS — E039 Hypothyroidism, unspecified: Secondary | ICD-10-CM | POA: Diagnosis present

## 2023-09-25 DIAGNOSIS — M86179 Other acute osteomyelitis, unspecified ankle and foot: Secondary | ICD-10-CM | POA: Diagnosis present

## 2023-09-25 DIAGNOSIS — D62 Acute posthemorrhagic anemia: Secondary | ICD-10-CM | POA: Diagnosis not present

## 2023-09-25 DIAGNOSIS — I2510 Atherosclerotic heart disease of native coronary artery without angina pectoris: Secondary | ICD-10-CM | POA: Diagnosis present

## 2023-09-25 DIAGNOSIS — L89620 Pressure ulcer of left heel, unstageable: Secondary | ICD-10-CM | POA: Diagnosis not present

## 2023-09-25 DIAGNOSIS — E1152 Type 2 diabetes mellitus with diabetic peripheral angiopathy with gangrene: Secondary | ICD-10-CM | POA: Diagnosis present

## 2023-09-25 DIAGNOSIS — T50905A Adverse effect of unspecified drugs, medicaments and biological substances, initial encounter: Secondary | ICD-10-CM | POA: Diagnosis present

## 2023-09-25 DIAGNOSIS — L89624 Pressure ulcer of left heel, stage 4: Secondary | ICD-10-CM | POA: Diagnosis present

## 2023-09-25 DIAGNOSIS — Z7984 Long term (current) use of oral hypoglycemic drugs: Secondary | ICD-10-CM

## 2023-09-25 DIAGNOSIS — E1169 Type 2 diabetes mellitus with other specified complication: Secondary | ICD-10-CM | POA: Diagnosis present

## 2023-09-25 DIAGNOSIS — K5900 Constipation, unspecified: Secondary | ICD-10-CM | POA: Diagnosis present

## 2023-09-25 DIAGNOSIS — Z794 Long term (current) use of insulin: Secondary | ICD-10-CM

## 2023-09-25 DIAGNOSIS — Z5986 Financial insecurity: Secondary | ICD-10-CM

## 2023-09-25 DIAGNOSIS — M86172 Other acute osteomyelitis, left ankle and foot: Secondary | ICD-10-CM | POA: Diagnosis present

## 2023-09-25 DIAGNOSIS — Z79899 Other long term (current) drug therapy: Secondary | ICD-10-CM

## 2023-09-25 DIAGNOSIS — Z888 Allergy status to other drugs, medicaments and biological substances status: Secondary | ICD-10-CM

## 2023-09-25 DIAGNOSIS — Z79890 Hormone replacement therapy (postmenopausal): Secondary | ICD-10-CM

## 2023-09-25 DIAGNOSIS — I130 Hypertensive heart and chronic kidney disease with heart failure and stage 1 through stage 4 chronic kidney disease, or unspecified chronic kidney disease: Secondary | ICD-10-CM | POA: Diagnosis present

## 2023-09-25 DIAGNOSIS — Z7982 Long term (current) use of aspirin: Secondary | ICD-10-CM

## 2023-09-25 DIAGNOSIS — I7090 Unspecified atherosclerosis: Secondary | ICD-10-CM | POA: Diagnosis not present

## 2023-09-25 DIAGNOSIS — Z955 Presence of coronary angioplasty implant and graft: Secondary | ICD-10-CM

## 2023-09-25 DIAGNOSIS — Z89512 Acquired absence of left leg below knee: Secondary | ICD-10-CM | POA: Diagnosis not present

## 2023-09-25 DIAGNOSIS — Z7401 Bed confinement status: Secondary | ICD-10-CM | POA: Diagnosis not present

## 2023-09-25 LAB — CBC WITH DIFFERENTIAL/PLATELET
Abs Immature Granulocytes: 0.04 K/uL (ref 0.00–0.07)
Basophils Absolute: 0 K/uL (ref 0.0–0.1)
Basophils Relative: 0 %
Eosinophils Absolute: 0.4 K/uL (ref 0.0–0.5)
Eosinophils Relative: 3 %
HCT: 35.3 % — ABNORMAL LOW (ref 39.0–52.0)
Hemoglobin: 11.2 g/dL — ABNORMAL LOW (ref 13.0–17.0)
Immature Granulocytes: 0 %
Lymphocytes Relative: 13 %
Lymphs Abs: 1.5 K/uL (ref 0.7–4.0)
MCH: 28.4 pg (ref 26.0–34.0)
MCHC: 31.7 g/dL (ref 30.0–36.0)
MCV: 89.6 fL (ref 80.0–100.0)
Monocytes Absolute: 0.9 K/uL (ref 0.1–1.0)
Monocytes Relative: 8 %
Neutro Abs: 8.3 K/uL — ABNORMAL HIGH (ref 1.7–7.7)
Neutrophils Relative %: 76 %
Platelets: 401 K/uL — ABNORMAL HIGH (ref 150–400)
RBC: 3.94 MIL/uL — ABNORMAL LOW (ref 4.22–5.81)
RDW: 13 % (ref 11.5–15.5)
WBC: 11.1 K/uL — ABNORMAL HIGH (ref 4.0–10.5)
nRBC: 0 % (ref 0.0–0.2)

## 2023-09-25 LAB — SEDIMENTATION RATE: Sed Rate: 106 mm/h — ABNORMAL HIGH (ref 0–16)

## 2023-09-25 LAB — C-REACTIVE PROTEIN: CRP: 5.9 mg/dL — ABNORMAL HIGH (ref <1.0)

## 2023-09-25 LAB — COMPREHENSIVE METABOLIC PANEL WITH GFR
ALT: 12 U/L (ref 0–44)
AST: 19 U/L (ref 15–41)
Albumin: 3.3 g/dL — ABNORMAL LOW (ref 3.5–5.0)
Alkaline Phosphatase: 123 U/L (ref 38–126)
Anion gap: 9 (ref 5–15)
BUN: 37 mg/dL — ABNORMAL HIGH (ref 8–23)
CO2: 23 mmol/L (ref 22–32)
Calcium: 8.9 mg/dL (ref 8.9–10.3)
Chloride: 104 mmol/L (ref 98–111)
Creatinine, Ser: 1.27 mg/dL — ABNORMAL HIGH (ref 0.61–1.24)
GFR, Estimated: >60 mL/min (ref >60)
Glucose, Bld: 224 mg/dL — ABNORMAL HIGH (ref 70–99)
Potassium: 4.8 mmol/L (ref 3.5–5.1)
Sodium: 136 mmol/L (ref 135–145)
Total Bilirubin: 0.9 mg/dL (ref 0.0–1.2)
Total Protein: 8.4 g/dL — ABNORMAL HIGH (ref 6.5–8.1)

## 2023-09-25 MED ORDER — BACLOFEN 10 MG PO TABS
10.0000 mg | ORAL_TABLET | Freq: Two times a day (BID) | ORAL | Status: DC
  Administered 2023-09-25 – 2023-09-28 (×6): 10 mg via ORAL
  Filled 2023-09-25 (×8): qty 1

## 2023-09-25 MED ORDER — ACETAMINOPHEN 325 MG PO TABS
650.0000 mg | ORAL_TABLET | Freq: Four times a day (QID) | ORAL | Status: DC | PRN
Start: 2023-09-25 — End: 2023-10-12
  Administered 2023-09-26 – 2023-10-11 (×6): 650 mg via ORAL
  Filled 2023-09-25 (×9): qty 2

## 2023-09-25 MED ORDER — LINEZOLID 600 MG PO TABS
600.0000 mg | ORAL_TABLET | Freq: Once | ORAL | Status: AC
  Administered 2023-09-25: 600 mg via ORAL
  Filled 2023-09-25: qty 1

## 2023-09-25 MED ORDER — LEVOTHYROXINE SODIUM 75 MCG PO TABS
75.0000 ug | ORAL_TABLET | Freq: Every day | ORAL | Status: DC
  Administered 2023-09-25 – 2023-10-11 (×17): 75 ug via ORAL
  Filled 2023-09-25 (×17): qty 1

## 2023-09-25 MED ORDER — DULOXETINE HCL 60 MG PO CPEP
60.0000 mg | ORAL_CAPSULE | Freq: Every day | ORAL | Status: DC
  Administered 2023-09-26 – 2023-10-12 (×15): 60 mg via ORAL
  Filled 2023-09-25 (×18): qty 1

## 2023-09-25 MED ORDER — GADOBUTROL 1 MMOL/ML IV SOLN
10.0000 mL | Freq: Once | INTRAVENOUS | Status: AC | PRN
  Administered 2023-09-25: 10 mL via INTRAVENOUS

## 2023-09-25 MED ORDER — TRAZODONE HCL 50 MG PO TABS
50.0000 mg | ORAL_TABLET | Freq: Every evening | ORAL | Status: DC | PRN
  Administered 2023-09-25: 50 mg via ORAL
  Filled 2023-09-25: qty 1

## 2023-09-25 MED ORDER — CLOPIDOGREL BISULFATE 75 MG PO TABS: 75.0000 mg | ORAL_TABLET | Freq: Every day | ORAL | Status: DC

## 2023-09-25 MED ORDER — SODIUM CHLORIDE 0.9 % IV SOLN
3.0000 g | Freq: Once | INTRAVENOUS | Status: AC
  Administered 2023-09-25: 3 g via INTRAVENOUS
  Filled 2023-09-25: qty 8

## 2023-09-25 MED ORDER — INSULIN ASPART 100 UNIT/ML IJ SOLN
0.0000 [IU] | Freq: Three times a day (TID) | INTRAMUSCULAR | Status: DC
  Administered 2023-09-26: 2 [IU] via SUBCUTANEOUS
  Administered 2023-09-26 – 2023-09-27 (×3): 4 [IU] via SUBCUTANEOUS
  Administered 2023-09-27: 8 [IU] via SUBCUTANEOUS
  Administered 2023-09-28 (×3): 4 [IU] via SUBCUTANEOUS
  Administered 2023-09-29: 8 [IU] via SUBCUTANEOUS
  Administered 2023-09-29: 4 [IU] via SUBCUTANEOUS
  Administered 2023-09-30 – 2023-10-05 (×10): 2 [IU] via SUBCUTANEOUS
  Administered 2023-10-05: 4 [IU] via SUBCUTANEOUS
  Administered 2023-10-06: 2 [IU] via SUBCUTANEOUS
  Administered 2023-10-06: 4 [IU] via SUBCUTANEOUS
  Administered 2023-10-06 – 2023-10-08 (×5): 2 [IU] via SUBCUTANEOUS
  Administered 2023-10-08: 4 [IU] via SUBCUTANEOUS
  Administered 2023-10-09 – 2023-10-10 (×4): 2 [IU] via SUBCUTANEOUS
  Administered 2023-10-10: 4 [IU] via SUBCUTANEOUS
  Administered 2023-10-10: 2 [IU] via SUBCUTANEOUS
  Administered 2023-10-11: 4 [IU] via SUBCUTANEOUS
  Filled 2023-09-25: qty 0.24

## 2023-09-25 MED ORDER — ONDANSETRON HCL 4 MG PO TABS: 4.0000 mg | ORAL_TABLET | Freq: Four times a day (QID) | ORAL | Status: DC | PRN

## 2023-09-25 MED ORDER — AMLODIPINE BESYLATE 10 MG PO TABS
10.0000 mg | ORAL_TABLET | Freq: Every morning | ORAL | Status: DC
  Administered 2023-09-26 – 2023-10-12 (×15): 10 mg via ORAL
  Filled 2023-09-25 (×18): qty 1

## 2023-09-25 MED ORDER — PNEUMOCOCCAL 20-VAL CONJ VACC 0.5 ML IM SUSY
0.5000 mL | PREFILLED_SYRINGE | INTRAMUSCULAR | Status: DC
  Filled 2023-09-25: qty 0.5

## 2023-09-25 MED ORDER — ASPIRIN 81 MG PO TBEC
81.0000 mg | DELAYED_RELEASE_TABLET | Freq: Every day | ORAL | Status: DC
  Administered 2023-09-26 – 2023-10-12 (×15): 81 mg via ORAL
  Filled 2023-09-25 (×18): qty 1

## 2023-09-25 MED ORDER — SACUBITRIL-VALSARTAN 24-26 MG PO TABS
1.0000 | ORAL_TABLET | Freq: Two times a day (BID) | ORAL | Status: DC
  Administered 2023-09-25 – 2023-10-12 (×32): 1 via ORAL
  Filled 2023-09-25 (×35): qty 1

## 2023-09-25 MED ORDER — ATORVASTATIN CALCIUM 40 MG PO TABS
40.0000 mg | ORAL_TABLET | Freq: Every evening | ORAL | Status: DC
  Administered 2023-09-25 – 2023-10-11 (×16): 40 mg via ORAL
  Filled 2023-09-25 (×19): qty 1

## 2023-09-25 MED ORDER — ACETAMINOPHEN 650 MG RE SUPP
650.0000 mg | Freq: Four times a day (QID) | RECTAL | Status: DC | PRN
  Filled 2023-09-25: qty 1

## 2023-09-25 MED ORDER — ONDANSETRON HCL 4 MG/2ML IJ SOLN: 4.0000 mg | Freq: Four times a day (QID) | INTRAMUSCULAR | Status: DC | PRN

## 2023-09-25 MED ORDER — PREGABALIN 75 MG PO CAPS
75.0000 mg | ORAL_CAPSULE | Freq: Two times a day (BID) | ORAL | Status: DC
  Administered 2023-09-25 – 2023-09-28 (×6): 75 mg via ORAL
  Filled 2023-09-25 (×8): qty 1

## 2023-09-25 MED ORDER — OXYCODONE HCL 5 MG PO TABS
5.0000 mg | ORAL_TABLET | ORAL | Status: DC | PRN
  Administered 2023-09-25 – 2023-09-26 (×4): 5 mg via ORAL
  Filled 2023-09-25 (×4): qty 1

## 2023-09-25 MED ORDER — ORAL CARE MOUTH RINSE: 15.0000 mL | OROMUCOSAL | Status: DC | PRN

## 2023-09-25 MED ORDER — MEDIHONEY WOUND/BURN DRESSING EX PSTE
1.0000 | PASTE | Freq: Every day | CUTANEOUS | Status: DC
  Administered 2023-09-25 – 2023-09-26 (×2): 1 via TOPICAL
  Filled 2023-09-25: qty 44

## 2023-09-25 MED ORDER — CARVEDILOL 6.25 MG PO TABS
6.2500 mg | ORAL_TABLET | Freq: Two times a day (BID) | ORAL | Status: DC
  Administered 2023-09-25 – 2023-09-30 (×9): 6.25 mg via ORAL
  Filled 2023-09-25 (×3): qty 1
  Filled 2023-09-25: qty 2
  Filled 2023-09-25 (×8): qty 1

## 2023-09-25 MED ORDER — ORAL CARE MOUTH RINSE
15.0000 mL | OROMUCOSAL | Status: DC | PRN
Start: 2023-09-25 — End: 2023-09-25

## 2023-09-25 NOTE — ED Triage Notes (Signed)
 Pt arrived via EMS, from [facility]. Sent for left heel wound evaluation. States has had for several months. Painful.

## 2023-09-25 NOTE — ED Provider Notes (Signed)
 [place] EMERGENCY DEPARTMENT AT [facility] Provider Note   CSN: [id number] Arrival date & time: 09/25/23  1240     Patient presents with: Wound Check   Steve Alvarado is a 68 y.o. male.  {Add pertinent medical, surgical, social history, OB history to HPI:4970} 68 year old male with a history of CAD status post PCI, CVA, diabetes, CKD, HTN, and HLD who presents to the emergency department with left heel pain.  Patient reports that for the past 3 weeks he has been having pain near his left heel.  Started to form an open wound.  Thinks it may have started after he injured it on his wheelchair.  Says that he has had some drainage from it recently.  No fevers or chills.  Has not yet been started on antibiotics or seen a doctor for this.       Prior to Admission medications   Medication Sig Start Date End Date Taking? Authorizing Provider  acetaminophen  (TYLENOL ) 500 MG tablet Take 500 mg by mouth 3 (three) times daily.    [provider]  albuterol  (VENTOLIN  HFA) 108 (90 Base) MCG/ACT inhaler Inhale 2 puffs into the lungs every 6 (six) hours as needed for wheezing.    [provider]  amLODipine  (NORVASC ) 10 MG tablet Take 1 tablet (10 mg total) by mouth every morning. 08/19/21   de Clint Kill, Cortney E, NP  aspirin  EC 81 MG tablet Take 1 tablet (81 mg total) by mouth daily. Swallow whole. 08/18/21   Remi Pippin, NP  atorvastatin  (LIPITOR) 40 MG tablet Take 1 tablet (40 mg total) by mouth every evening. 10/23/20   Maree Hue, MD  baclofen  (LIORESAL ) 10 MG tablet Take 10 mg by mouth 2 (two) times daily.    [provider]  benzonatate  (TESSALON ) 100 MG capsule Take 1 capsule (100 mg total) by mouth every 8 (eight) hours as needed for cough. 04/07/23   Veta Palma, PA-C  Calamine-Zinc  Oxide 8-8 % LOTN Apply 1 application  topically in the morning and at bedtime. Apply to scrotum topically every night and day shift for redness.    [provider]  carboxymethylcellulose (REFRESH PLUS) 0.5 % SOLN Place 2 drops into both eyes every 4 (four) hours as needed (dry eyes).    [provider]  carvedilol  (COREG ) 6.25 MG tablet Take 1 tablet (6.25 mg total) by mouth 2 (two) times daily with a meal. 09/01/22   Fausto Sor A, DO  cetirizine (ZYRTEC) 10 MG tablet Take 10 mg by mouth at bedtime.    [provider]  clopidogrel  (PLAVIX ) 75 MG tablet Take 1 tablet (75 mg total) by mouth daily. 09/02/22   Fausto Sor LABOR, DO  cycloSPORINE (RESTASIS) 0.05 % ophthalmic emulsion Place 1 drop into both eyes 2 (two) times daily.    [provider]  dapagliflozin propanediol (FARXIGA) 5 MG TABS tablet Take 5 mg by mouth daily.    [provider]  dextromethorphan-guaiFENesin  (ROBITUSSIN-DM) 10-100 MG/5ML liquid Take 10 mLs by mouth every 6 (six) hours as needed for cough.    [provider]  DULoxetine  (CYMBALTA ) 60 MG capsule Take 60 mg by mouth daily.    [provider]  ferrous sulfate  325 (65 FE) MG EC tablet Take 1 tablet (325 mg total) by mouth every other day. Patient taking differently: Take 325 mg by mouth daily. 11/09/21 06/06/23  Wouk, Devaughn Sayres, MD  furosemide  (LASIX ) 20 MG tablet Take 1 tablet (20 mg total)  by mouth every other day. Monitor volume status and change dose accordingly. 07/28/22 07/28/23  Von Bellis, MD  Glucagon, rDNA, (GLUCAGON EMERGENCY) 1 MG KIT Inject 1 g into the vein as needed (hypoglycemia).    [provider]  insulin  aspart (NOVOLOG ) 100 UNIT/ML injection Inject 5 Units into the skin 3 (three) times daily with meals. Patient not taking: Reported on 03/26/2023 09/01/22   Fausto Sor A, DO  insulin  aspart (NOVOLOG ) 100 UNIT/ML injection Inject 0-9 Units into the skin 3 (three) times daily with meals. Patient not taking: Reported on 03/26/2023 09/01/22   Fausto Sor A, DO  insulin  aspart (NOVOLOG ) 100 UNIT/ML injection Inject 0-9 Units into the  skin 3 (three) times daily with meals. 06/07/23   Dezii, Alexandra, DO  LANTUS  SOLOSTAR 100 UNIT/ML Solostar Pen Inject 24 Units into the skin at bedtime. Patient taking differently: Inject 40 Units into the skin at bedtime. 09/01/22   Fausto Sor A, DO  levothyroxine  (SYNTHROID ) 75 MCG tablet Take 75 mcg by mouth at bedtime. 2200 05/19/21   [provider]  melatonin 5 MG TABS Take 5 mg by mouth at bedtime.    [provider]  Multiple Vitamin (MULTIVITAMIN WITH MINERALS) TABS tablet Take 1 tablet by mouth daily. 09/02/22   Fausto Sor A, DO  ondansetron  (ZOFRAN ) 4 MG tablet Take 4 mg by mouth every 8 (eight) hours as needed for nausea or vomiting.    [provider]  polyethylene glycol (MIRALAX  / GLYCOLAX ) 17 g packet Take 17 g by mouth daily as needed for mild constipation. 08/22/21   Arlice Reichert, MD  pregabalin  (LYRICA ) 50 MG capsule Take 50 mg by mouth 2 (two) times daily.    [provider]  sacubitril -valsartan  (ENTRESTO ) 24-26 MG Take 1 tablet by mouth 2 (two) times daily. 11/29/21   Donette Ellouise LABOR, FNP  senna-docusate (SENOKOT-S) 8.6-50 MG tablet Take 1 tablet by mouth 2 (two) times daily. Hold if having loose or frequent stools. Patient taking differently: Take 1 tablet by mouth at bedtime. Hold if having loose or frequent stools. 09/01/22   Fausto Sor LABOR, DO  traZODone  (DESYREL ) 50 MG tablet Take 1 tablet (50 mg total) by mouth at bedtime as needed for sleep. 03/28/23   Bryn Bernardino NOVAK, MD    Allergies: Ace inhibitors    Review of Systems  Updated Vital Signs BP (!) 186/90 (BP Location: Left Arm)   Pulse 85   Temp 98.7 F (37.1 C) (Oral)   Resp 20   SpO2 100%   Physical Exam Vitals and nursing note reviewed.  Constitutional:      General: He is not in acute distress.    Appearance: He is well-developed.  HENT:     Head: Normocephalic and atraumatic.     Right Ear: External ear normal.     Left Ear: External ear normal.     Nose:  Nose normal.  Eyes:     Extraocular Movements: Extraocular movements intact.     Conjunctiva/sclera: Conjunctivae normal.     Pupils: Pupils are equal, round, and reactive to light.  Musculoskeletal:     Cervical back: Normal range of motion and neck supple.     Right lower leg: Edema present.     Left lower leg: Edema present.  Skin:    General: Skin is warm and dry.     Comments: Stage III pressure ulcer to left heel approximately 6 cm x 6 cm.  Unable to probe to bone.  No significant overlying erythema or warmth.  No discharge appreciated.  Neurological:     Mental Status: He is alert. Mental status is at baseline.  Psychiatric:        Mood and Affect: Mood normal.        Behavior: Behavior normal.     (all labs ordered are listed, but only abnormal results are displayed) Labs Reviewed - No data to display  EKG: None  Radiology: No results found.  {Document cardiac monitor, telemetry assessment procedure when appropriate:32947} Procedures   Medications Ordered in the ED - No data to display    {Click here for ABCD2, HEART and other calculators REFRESH Note before signing:1}                              Medical Decision Making Amount and/or Complexity of Data Reviewed Labs: ordered. Radiology: ordered.  Risk Prescription drug management.   ***  {Document critical care time when appropriate  Document review of labs and clinical decision tools ie CHADS2VASC2, etc  Document your independent review of radiology images and any outside records  Document your discussion with family members, caretakers and with consultants  Document social determinants of health affecting pt's care  Document your decision making why or why not admission, treatments were needed:32947:::1}   Final diagnoses:  None    ED Discharge Orders     None

## 2023-09-25 NOTE — ED Notes (Addendum)
 Took pt vitals and it was higher than before 202/124 while resting informed the nurse

## 2023-09-25 NOTE — ED Provider Notes (Signed)
  Physical Exam  BP (!) 170/81 (BP Location: Right Arm)   Pulse 80   Temp (!) 100.8 F (38.2 C) (Oral)   Resp 17   SpO2 98%   Physical Exam  Procedures  Procedures  ED Course / MDM   Clinical Course as of 09/25/23 2245  Mon Sep 25, 2023  1633 Signed out to Dr Lavonia.  [RP]  1720 Admit to cone. Needs to get in touch with Jerona Sage.  [TL]    Clinical Course User Index [RP] Yolande Lamar BROCKS, MD [TL] Simon Lavonia SAILOR, MD   Medical Decision Making Amount and/or Complexity of Data Reviewed Labs: ordered. Radiology: ordered.  Risk Prescription drug management. Decision regarding hospitalization.    Signout: Awaiting labs. Concern for osteo. Xray consistent with this. Abx started. Likely admit for abx and debridement. Need to speak to ortho and then admit   Per ortho consult: Admit to [facility]. Needs to get in touch with Jerona Sage in the morning and they will dictate MRI need and if needs surgical intervention.    Patient admitted in stable condition.        Simon Lavonia SAILOR, MD 09/25/23 2245

## 2023-09-25 NOTE — H&P (Signed)
 History and Physical    MENASHE KAFER FMW:[id number] DOB: 1955-06-14 DOA: 09/25/2023  PCP: Celinda Chrystal HERO, MD   Chief Complaint:  heel wound  HPI: Steve Alvarado is a 68 y.o. male with medical history significant of prior stroke, CKD, CHF, CAD, hypertension, hyperlipidemia, hypothyroidism who presented to the emergency department with left heel pain.  Patient states for the last 3 weeks he has been having pain in his left heel and developed an open wound.  Due to persistent pain he presented to the ER for further evaluation.  On arrival he was afebrile hemodynamically stable.  Labs were obtained on presentation which showed WBC 11.1, hemoglobin 11.2, sodium 136, glucose 224, creatinine 1.27, ESR 106, x-ray of foot showed subcutaneous gas concerning for possible osteo with recommended MR foot.  Orthopedics was consulted and patient was admitted for further workup.  Patient was initiated on linezolid  and Unasyn    Review of Systems: Review of Systems  Constitutional:  Positive for fever. Negative for chills.  HENT: Negative.    Eyes: Negative.   Respiratory: Negative.    Cardiovascular: Negative.   Gastrointestinal: Negative.   Genitourinary: Negative.   Musculoskeletal: Negative.   Skin: Negative.   Neurological: Negative.   Endo/Heme/Allergies: Negative.   Psychiatric/Behavioral: Negative.       As per HPI otherwise 10 point review of systems negative.   Allergies  Allergen Reactions   Ace Inhibitors Cough    Reaction not listed on MAR    Past Medical History:  Diagnosis Date   Acute kidney injury (HCC) 08/20/2021   Basal ganglia stroke (HCC)    a. 07/2021 L sided wkns/facial droop/fall-->MRI brain: nonhemorrhagic infarct of posterior R lentiform nucleus and corona radiata measure. Remote lacunar infarcts of post cerebellum bilat and L thalamus.   CHF (congestive heart failure) (HCC)    CKD (chronic kidney disease), stage II    a. 08/2021 AKI w/ creat up to 3.4.    Cocaine use    Coronary artery disease    a. 07/2010 PCI: LAD 105m, LCX 80 diff/small, OM1 93m, RCA 68m (3.0x18 Vision BMS). EF 50%; b. 07/2013 Cath: LM nl, LAD 30p, 20d, LCX 20d, LCX 100, OM1 60, RCA 30p, 29m, RPDA 90-->Med Rx; c. 10/2020 MV: EF 46% (60-65% by echo), no ischemia. 3V cor Ca2+.   Depression    Diabetes mellitus    Type II   Diastolic dysfunction    a. 10/2020 Echo: EF 60-65%, no rwma, mild LVH, GrI DD, nl RV size/fxn; b. 07/2021 Echo: EF 60-65%, no rwma, GrI DD, nl RV fxn, mildly dil LA, triv MR; c. 10/2021 Echo: EF 55-60%, no rwma, GrI DD, nl RV fxn, mild-mod MR, Asc Ao 37mm.   Hyperlipidemia    Hypertension    Hypertensive urgency 03/08/2015   Hypothyroidism     Past Surgical History:  Procedure Laterality Date   CARDIAC CATHETERIZATION  08/06/2010   Bare metal stent placed in RCA.   HAND SURGERY     left     reports that he has never smoked. He has never used smokeless tobacco. He reports current drug use. Drug: Cocaine. He reports that he does not drink alcohol.  Family History  Problem Relation Age of Onset   Heart attack Mother     Prior to Admission medications   Medication Sig Start Date End Date Taking? Authorizing Provider  acetaminophen  (TYLENOL ) 500 MG tablet Take 500 mg by mouth in the morning and at bedtime.   Yes [provider]  albuterol  (VENTOLIN  HFA) 108 (90 Base) MCG/ACT inhaler Inhale 2 puffs into the lungs every 6 (six) hours as needed for wheezing.   Yes [provider]  amLODipine  (NORVASC ) 10 MG tablet Take 1 tablet (10 mg total) by mouth every morning. 08/19/21  Yes de Clint Kill, Cortney E, NP  aspirin  EC 81 MG tablet Take 1 tablet (81 mg total) by mouth daily. Swallow whole. Patient taking differently: Take 81 mg by mouth in the morning. Swallow whole. 08/18/21  Yes Shafer, Jorene, NP  atorvastatin  (LIPITOR) 40 MG tablet Take 1 tablet (40 mg total) by mouth every evening. Patient taking differently: Take 40 mg by mouth at bedtime.  10/23/20  Yes Maree Hue, MD  baclofen  (LIORESAL ) 10 MG tablet Take 10 mg by mouth 2 (two) times daily.   Yes [provider]  benzonatate  (TESSALON ) 100 MG capsule Take 1 capsule (100 mg total) by mouth every 8 (eight) hours as needed for cough. 04/07/23  Yes Veta Palma, PA-C  Calamine-Zinc  Oxide 8-8 % LOTN Apply 1 application  topically in the morning and at bedtime. Apply to scrotum topically every night and day shift for redness.   Yes [provider]  carboxymethylcellulose (REFRESH PLUS) 0.5 % SOLN Place 2 drops into both eyes every 4 (four) hours as needed (dry eyes).   Yes [provider]  carvedilol  (COREG ) 6.25 MG tablet Take 1 tablet (6.25 mg total) by mouth 2 (two) times daily with a meal. 09/01/22  Yes Fausto Sor A, DO  cetirizine (ZYRTEC) 10 MG tablet Take 10 mg by mouth at bedtime.   Yes [provider]  clopidogrel  (PLAVIX ) 75 MG tablet Take 1 tablet (75 mg total) by mouth daily. Patient taking differently: Take 75 mg by mouth in the morning. 09/02/22  Yes Fausto Sor A, DO  cycloSPORINE (RESTASIS) 0.05 % ophthalmic emulsion Place 1 drop into both eyes 2 (two) times daily.   Yes [provider]  dapagliflozin propanediol (FARXIGA) 5 MG TABS tablet Take 5 mg by mouth in the morning.   Yes [provider]  dextromethorphan-guaiFENesin  (ROBITUSSIN-DM) 10-100 MG/5ML liquid Take 10 mLs by mouth every 6 (six) hours as needed for cough.   Yes [provider]  DULoxetine  (CYMBALTA ) 60 MG capsule Take 60 mg by mouth in the morning.   Yes [provider]  ferrous sulfate  325 (65 FE) MG EC tablet Take 1 tablet (325 mg total) by mouth every other day. Patient taking differently: Take 325 mg by mouth in the morning. 11/09/21 09/25/23 Yes Wouk, Devaughn Sayres, MD  furosemide  (LASIX ) 20 MG tablet Take 1 tablet (20 mg total) by mouth every other day. Monitor volume status and change dose accordingly. 07/28/22 09/25/23 Yes  Von Bellis, MD  Glucagon, rDNA, (GLUCAGON EMERGENCY) 1 MG KIT Inject 1 g into the vein as needed (hypoglycemia).   Yes [provider]  HYDROcodone -acetaminophen  (NORCO/VICODIN) 5-325 MG tablet Take 1 tablet by mouth every 6 (six) hours as needed for moderate pain (pain score 4-6). 09/08/23 09/28/23 Yes [provider]  insulin  lispro (HUMALOG  KWIKPEN) 100 UNIT/ML KwikPen Inject 5 Units into the skin 3 (three) times daily.   Yes [provider]  insulin  lispro (HUMALOG  KWIKPEN) 100 UNIT/ML KwikPen Inject 0-9 Units into the skin See admin instructions. Inject as per sliding scale: if 70-120 = 0, 121-150= 1 unit, 151-200 = 3 units, 201-300 = 5 units, 301-350 = 7 units, 351-400 = 9 units. Check 3 times daily.  Yes [provider]  loperamide  (IMODIUM  A-D) 2 MG tablet Take 2 mg by mouth as needed for diarrhea or loose stools.   Yes [provider]  Multiple Vitamin (MULTIVITAMIN WITH MINERALS) TABS tablet Take 1 tablet by mouth daily. Patient taking differently: Take 1 tablet by mouth in the morning. 09/02/22  Yes Fausto Sor A, DO  ondansetron  (ZOFRAN ) 4 MG tablet Take 4 mg by mouth every 8 (eight) hours as needed for nausea or vomiting.   Yes [provider]  polyethylene glycol (MIRALAX  / GLYCOLAX ) 17 g packet Take 17 g by mouth daily as needed for mild constipation. 08/22/21  Yes Dahal, Chapman, MD  pregabalin  (LYRICA ) 75 MG capsule Take 75 mg by mouth 2 (two) times daily.   Yes [provider]  sacubitril -valsartan  (ENTRESTO ) 24-26 MG Take 1 tablet by mouth 2 (two) times daily. 11/29/21  Yes Hackney, Tina A, FNP  senna-docusate (SENOKOT-S) 8.6-50 MG tablet Take 1 tablet by mouth 2 (two) times daily. Hold if having loose or frequent stools. Patient taking differently: Take 2 tablets by mouth 2 (two) times daily. Hold if having loose or frequent stools. 09/01/22  Yes Fausto Sor A, DO  UNABLE TO FIND 30 mLs by Other route in the morning  and at bedtime. Liquid Protein 30cc   Yes [provider]  levothyroxine  (SYNTHROID ) 75 MCG tablet Take 75 mcg by mouth at bedtime. 2200 05/19/21   [provider]  melatonin 5 MG TABS Take 5 mg by mouth at bedtime.    [provider]  traZODone  (DESYREL ) 50 MG tablet Take 1 tablet (50 mg total) by mouth at bedtime as needed for sleep. 03/28/23   Bryn Bernardino NOVAK, MD    Physical Exam: Vitals:   09/25/23 1256 09/25/23 1619 09/25/23 1651 09/25/23 1846  BP: (!) 186/90 (!) 202/124  (!) 213/104  Pulse: 85 83  84  Resp: 20 18  16   Temp: 98.7 F (37.1 C)  98.6 F (37 C) 100.1 F (37.8 C)  TempSrc: Oral  Oral Oral  SpO2: 100% 98%  98%   Physical Exam Constitutional:      Appearance: He is normal weight.  HENT:     Head: Normocephalic.     Mouth/Throat:     Mouth: Mucous membranes are moist.     Pharynx: Oropharynx is clear.  Eyes:     Conjunctiva/sclera: Conjunctivae normal.     Pupils: Pupils are equal, round, and reactive to light.  Cardiovascular:     Rate and Rhythm: Normal rate and regular rhythm.     Pulses: Normal pulses.     Heart sounds: Normal heart sounds.  Pulmonary:     Effort: Pulmonary effort is normal.     Breath sounds: Normal breath sounds.  Abdominal:     General: Bowel sounds are normal.  Musculoskeletal:        General: Normal range of motion.     Cervical back: Normal range of motion.  Skin:    General: Skin is warm.     Capillary Refill: Capillary refill takes less than 2 seconds.  Neurological:     General: No focal deficit present.     Mental Status: He is alert. Mental status is at baseline.  Psychiatric:        Mood and Affect: Mood normal.       Labs on Admission: I have personally reviewed the patients's labs and imaging studies.  Assessment/Plan Principal Problem:   Acute osteomyelitis of calcaneum,  unspecified laterality (HCC)   # Osteomyelitis of left calcaneus - Patient presented with foot pain found to have  wound - Inflammatory markers elevated - X-ray shows possible osteo  Plan: Hold further antibiotics pending biopsy N.p.o. at midnight Obtain MR foot  #Htn- continue amlodipine , coreg   #Hx of CVA- hold asa, plavix  for possible surgical intervention.   #Hypothyroidism- continue levothyroxine   #Chronic pain- continue lyrica   Hx of CHF, not in excaerbation- continue GDMT  #Insomnia- continue trazodone     Admission status: Inpatient Med-Surg  Certification: The appropriate patient status for this patient is INPATIENT. Inpatient status is judged to be reasonable and necessary in order to provide the required intensity of service to ensure the patient's safety. The patient's presenting symptoms, physical exam findings, and initial radiographic and laboratory data in the context of their chronic comorbidities is felt to place them at high risk for further clinical deterioration. Furthermore, it is not anticipated that the patient will be medically stable for discharge from the hospital within 2 midnights of admission.   * I certify that at the point of admission it is my clinical judgment that the patient will require inpatient hospital care spanning beyond 2 midnights from the point of admission due to high intensity of service, high risk for further deterioration and high frequency of surveillance required.DEWAINE Lamar Dess MD [facility] If 7PM-7AM, please contact night-coverage [email or web address]  09/25/2023, 6:58 PM

## 2023-09-25 NOTE — Consult Note (Addendum)
 WOC Nurse Consult Note: Reason for Consult: Consult requested for left heel. Pt states it has been present , for awhile. Left heel with approx 70% eschar, 30% red, bone palpable when probed with a swab which makes it a Stage 4 pressure injury, mod amt pink drainage with strong foul odor. 6X6X.3cm X-ray is pending to R/O osteomyelitis.  Discussed plan of care with ED physician at the bedside.  Pt could benefit from follow-up at the outpatient wound care center after discharge.  This must be by physician referral and can be arranged by a case manager if desired.   Dressing procedure/placement/frequency: Float heels to reduce pressure. Topical treatment instructions provided for bedside nurses to perform as follows to assist with removal of nonviable tissue: Apply Medihoney to left heel Q day, then cover with foam dressing.  Post note 1440: X-ray indicates, Subcutaneous gas overlying the posterior calcaneal margin, likely representing patient's known wound. Cortical irregularity is also present in the underlying calcaneus, worrisome for osteomyelitis. A follow-up MRI of the foot is recommended for confirmation.  This complex medical condition is beyond the scope of practice for WOC nurses; please consult ortho service /infectious disease for further plan of care.  Please re-consult if further assistance is needed.  Thank-you,  Stephane Fought MSN, RN, CWOCN, [place], CNS [phone]

## 2023-09-25 NOTE — ED Notes (Signed)
 ED TO INPATIENT HANDOFF REPORT  ED Nurse Name and Phone #:   S Name/Age/Gender Steve Alvarado 68 y.o. male Room/Bed: WHALC/WHALC  Code Status   Code Status: Full Code  Home/SNF/Other Skilled nursing facility Patient oriented to: self, place, time, and situation Is this baseline? Yes   Triage Complete: Triage complete  Chief Complaint Acute osteomyelitis of calcaneum, unspecified laterality [facility]) [M86.179]  Triage Note Pt arrived via EMS, from [facility]. Sent for left heel wound evaluation. States has had for several months. Painful.    Allergies Allergies  Allergen Reactions   Ace Inhibitors Cough    Reaction not listed on MAR    Level of Care/Admitting Diagnosis ED Disposition     ED Disposition  Admit   Condition  --   Comment  Hospital Area: [facility] [[id number]]  Level of Care: Med-Surg [16]  May admit patient to [facility] or [facility] if equivalent level of care is available:: No  Covid Evaluation: Asymptomatic - no recent exposure (last 10 days) testing not required  Diagnosis: Acute osteomyelitis of calcaneum, unspecified laterality [facility]) [[id number]]  Admitting Physician: DENA CHARLESTON [[id number]]  Attending Physician: DENA CHARLESTON [[id number]]  Certification:: I certify this patient will need inpatient services for at least 2 midnights  Expected Medical Readiness: 09/27/2023          B Medical/Surgery History Past Medical History:  Diagnosis Date   Acute kidney injury (HCC) 08/20/2021   Basal ganglia stroke (HCC)    a. 07/2021 L sided wkns/facial droop/fall-->MRI brain: nonhemorrhagic infarct of posterior R lentiform nucleus and corona radiata measure. Remote lacunar infarcts of post cerebellum bilat and L thalamus.   CHF (congestive heart failure) (HCC)    CKD (chronic kidney disease), stage II    a. 08/2021 AKI w/ creat up to 3.4.   Cocaine use    Coronary artery disease    a. 07/2010 PCI: LAD 23m, LCX 80 diff/small,  OM1 56m, RCA 67m (3.0x18 Vision BMS). EF 50%; b. 07/2013 Cath: LM nl, LAD 30p, 20d, LCX 20d, LCX 100, OM1 60, RCA 30p, 22m, RPDA 90-->Med Rx; c. 10/2020 MV: EF 46% (60-65% by echo), no ischemia. 3V cor Ca2+.   Depression    Diabetes mellitus    Type II   Diastolic dysfunction    a. 10/2020 Echo: EF 60-65%, no rwma, mild LVH, GrI DD, nl RV size/fxn; b. 07/2021 Echo: EF 60-65%, no rwma, GrI DD, nl RV fxn, mildly dil LA, triv MR; c. 10/2021 Echo: EF 55-60%, no rwma, GrI DD, nl RV fxn, mild-mod MR, Asc Ao 37mm.   Hyperlipidemia    Hypertension    Hypertensive urgency 03/08/2015   Hypothyroidism    Past Surgical History:  Procedure Laterality Date   CARDIAC CATHETERIZATION  08/06/2010   Bare metal stent placed in RCA.   HAND SURGERY     left     A IV Location/Drains/Wounds Patient Lines/Drains/Airways Status     Active Line/Drains/Airways     Name Placement date Placement time Site Days   Peripheral IV 09/25/23 20 G 2.5 Anterior;Right Forearm 09/25/23  --  Forearm  less than 1   Pressure Injury 06/06/23 Buttocks Right Stage 1 -  Intact skin with non-blanchable redness of a localized area usually over a bony prominence. 06/06/23  1600  -- 111   Wound / Incision (Open or Dehisced) 06/06/23 Diabetic ulcer Heel Right 06/06/23  1600  Heel  111   Wound / Incision (Open or Dehisced) 06/06/23  Diabetic ulcer Heel Left 06/06/23  1600  Heel  111   Wound / Incision (Open or Dehisced) 06/06/23 Skin tear Coccyx Other (Comment) 06/06/23  1600  Coccyx  111   Wound / Incision (Open or Dehisced) 06/06/23 Irritant Dermatitis (Moisture Associated Skin Damage) Buttocks Left;Right 06/06/23  1600  Buttocks  111   Wound / Incision (Open or Dehisced) 06/06/23 Irritant Dermatitis (Moisture Associated Skin Damage) Groin Right;Left 06/06/23  1600  Groin  111   Wound 09/25/23 Pressure Injury Heel Left Stage 4 - Full thickness tissue loss with exposed bone, tendon or muscle. 09/25/23  --  Heel  less than 1             Intake/Output Last 24 hours  Intake/Output Summary (Last 24 hours) at 09/25/2023 1939 Last data filed at 09/25/2023 1610 Gross per 24 hour  Intake 95.01 ml  Output --  Net 95.01 ml    Labs/Imaging Results for orders placed or performed during the hospital encounter of 09/25/23 (from the past 48 hours)  CBC with Differential     Status: Abnormal   Collection Time: 09/25/23  3:15 PM  Result Value Ref Range   WBC 11.1 (H) 4.0 - 10.5 K/uL   RBC 3.94 (L) 4.22 - 5.81 MIL/uL   Hemoglobin 11.2 (L) 13.0 - 17.0 g/dL   HCT 64.6 (L) 60.9 - 47.9 %   MCV 89.6 80.0 - 100.0 fL   MCH 28.4 26.0 - 34.0 pg   MCHC 31.7 30.0 - 36.0 g/dL   RDW 86.9 88.4 - 84.4 %   Platelets 401 (H) 150 - 400 K/uL   nRBC 0.0 0.0 - 0.2 %   Neutrophils Relative % 76 %   Neutro Abs 8.3 (H) 1.7 - 7.7 K/uL   Lymphocytes Relative 13 %   Lymphs Abs 1.5 0.7 - 4.0 K/uL   Monocytes Relative 8 %   Monocytes Absolute 0.9 0.1 - 1.0 K/uL   Eosinophils Relative 3 %   Eosinophils Absolute 0.4 0.0 - 0.5 K/uL   Basophils Relative 0 %   Basophils Absolute 0.0 0.0 - 0.1 K/uL   Immature Granulocytes 0 %   Abs Immature Granulocytes 0.04 0.00 - 0.07 K/uL    Comment: Performed at [facility], [address], [place], [place] [address]  Comprehensive metabolic panel     Status: Abnormal   Collection Time: 09/25/23  3:15 PM  Result Value Ref Range   Sodium 136 135 - 145 mmol/L   Potassium 4.8 3.5 - 5.1 mmol/L   Chloride 104 98 - 111 mmol/L   CO2 23 22 - 32 mmol/L   Glucose, Bld 224 (H) 70 - 99 mg/dL    Comment: Glucose reference range applies only to samples taken after fasting for at least 8 hours.   BUN 37 (H) 8 - 23 mg/dL   Creatinine, Ser 8.72 (H) 0.61 - 1.24 mg/dL   Calcium  8.9 8.9 - 10.3 mg/dL   Total Protein 8.4 (H) 6.5 - 8.1 g/dL   Albumin 3.3 (L) 3.5 - 5.0 g/dL   AST 19 15 - 41 U/L   ALT 12 0 - 44 U/L   Alkaline Phosphatase 123 38 - 126 U/L   Total Bilirubin 0.9 0.0 - 1.2 mg/dL   GFR, Estimated  >39 >39 mL/min    Comment: (NOTE) Calculated using the CKD-EPI Creatinine Equation (2021)    Anion gap 9 5 - 15    Comment: Performed at [facility], [address] [place], [place] [address]  Sedimentation rate     Status: Abnormal   Collection Time: 09/25/23  3:15 PM  Result Value Ref Range   Sed Rate 106 (H) 0 - 16 mm/hr    Comment: Performed at [facility], [address], [place], [place] [address]   DG Foot Complete Left Result Date: 09/25/2023 CLINICAL DATA:  L foot wound EXAM: LEFT FOOT - COMPLETE 3+ VIEW COMPARISON:  None Available. FINDINGS: Diffuse osteopenia.No acute fracture or dislocation. There is no evidence of arthropathy or other focal bone abnormality. Severe diffuse peripheral vascular atherosclerosis. Subcutaneous gas overlying the posterior calcaneal margin, likely due to underlying patient's wound. Cortical irregularity is also present in the underlying calcaneus, worrisome for osteomyelitis. IMPRESSION: Subcutaneous gas overlying the posterior calcaneal margin, likely representing patient's known wound. Cortical irregularity is also present in the underlying calcaneus, worrisome for osteomyelitis. A follow-up MRI of the foot is recommended for confirmation Electronically Signed   By: Rogelia Myers M.D.   On: 09/25/2023 14:30    Pending Labs Unresulted Labs (From admission, onward)     Start     Ordered   09/26/23 0500  Basic metabolic panel  Tomorrow morning,   R        09/25/23 1853   09/26/23 0500  CBC  Tomorrow morning,   R        09/25/23 1853   09/26/23 0500  Protime-INR  Tomorrow morning,   R        09/25/23 1853   09/25/23 1445  Blood Cultures x 2 sites  BLOOD CULTURE X 2,   STAT      09/25/23 1445   09/25/23 1301  C-reactive protein  Once,   URGENT        09/25/23 1300            Vitals/Pain Today's Vitals   09/25/23 1619 09/25/23 1651 09/25/23 1846 09/25/23 1919  BP: (!) 202/124  (!) 213/104   Pulse:  83  84   Resp: 18  16   Temp:  98.6 F (37 C) 100.1 F (37.8 C)   TempSrc:  Oral Oral   SpO2: 98%  98%   PainSc:    9     Isolation Precautions No active isolations  Medications Medications  leptospermum manuka honey (MEDIHONEY) paste 1 Application (1 Application Topical Given 09/25/23 1538)  aspirin  EC tablet 81 mg (has no administration in time range)  amLODipine  (NORVASC ) tablet 10 mg (has no administration in time range)  atorvastatin  (LIPITOR) tablet 40 mg (40 mg Oral Given 09/25/23 1916)  carvedilol  (COREG ) tablet 6.25 mg (6.25 mg Oral Given 09/25/23 1916)  sacubitril -valsartan  (ENTRESTO ) 24-26 mg per tablet (has no administration in time range)  DULoxetine  (CYMBALTA ) DR capsule 60 mg (has no administration in time range)  traZODone  (DESYREL ) tablet 50 mg (has no administration in time range)  levothyroxine  (SYNTHROID ) tablet 75 mcg (has no administration in time range)  baclofen  (LIORESAL ) tablet 10 mg (has no administration in time range)  pregabalin  (LYRICA ) capsule 75 mg (has no administration in time range)  acetaminophen  (TYLENOL ) tablet 650 mg (has no administration in time range)    Or  acetaminophen  (TYLENOL ) suppository 650 mg (has no administration in time range)  oxyCODONE  (Oxy IR/ROXICODONE ) immediate release tablet 5 mg (5 mg Oral Given 09/25/23 1919)  ondansetron  (ZOFRAN ) tablet 4 mg (has no administration in time range)    Or  ondansetron  (ZOFRAN ) injection 4 mg (has no administration in  time range)  insulin  aspart (novoLOG ) injection 0-24 Units (has no administration in time range)  Ampicillin -Sulbactam (UNASYN ) 3 g in sodium chloride  0.9 % 100 mL IVPB (0 g Intravenous Stopped 09/25/23 1610)    And  linezolid  (ZYVOX ) tablet 600 mg (600 mg Oral Given 09/25/23 1535)    Mobility non-ambulatory     Focused Assessments    R Recommendations: See Admitting Provider Note  Report given to:   Additional Notes:

## 2023-09-25 NOTE — ED Notes (Addendum)
 Pt bp is still elevated informed the nurse 213/104 map (134)

## 2023-09-25 NOTE — ED Notes (Signed)
 Order completion is delayed due to not having IV access. Patient is a difficult stick and needs ultrasound IV. Request for the IV team was submitted

## 2023-09-25 NOTE — ED Notes (Signed)
[facility] on unit for transport to [facility]

## 2023-09-26 ENCOUNTER — Inpatient Hospital Stay (HOSPITAL_COMMUNITY)

## 2023-09-26 DIAGNOSIS — M869 Osteomyelitis, unspecified: Secondary | ICD-10-CM | POA: Diagnosis not present

## 2023-09-26 DIAGNOSIS — M86179 Other acute osteomyelitis, unspecified ankle and foot: Secondary | ICD-10-CM | POA: Diagnosis not present

## 2023-09-26 DIAGNOSIS — I7090 Unspecified atherosclerosis: Secondary | ICD-10-CM | POA: Diagnosis not present

## 2023-09-26 DIAGNOSIS — I96 Gangrene, not elsewhere classified: Secondary | ICD-10-CM

## 2023-09-26 LAB — CBC
HCT: 33 % — ABNORMAL LOW (ref 39.0–52.0)
Hemoglobin: 10.7 g/dL — ABNORMAL LOW (ref 13.0–17.0)
MCH: 28.4 pg (ref 26.0–34.0)
MCHC: 32.4 g/dL (ref 30.0–36.0)
MCV: 87.5 fL (ref 80.0–100.0)
Platelets: 383 K/uL (ref 150–400)
RBC: 3.77 MIL/uL — ABNORMAL LOW (ref 4.22–5.81)
RDW: 13.1 % (ref 11.5–15.5)
WBC: 9.8 K/uL (ref 4.0–10.5)
nRBC: 0 % (ref 0.0–0.2)

## 2023-09-26 LAB — PROTIME-INR
INR: 1 (ref 0.8–1.2)
Prothrombin Time: 14 s (ref 11.4–15.2)

## 2023-09-26 LAB — BASIC METABOLIC PANEL WITH GFR
Anion gap: 12 (ref 5–15)
BUN: 35 mg/dL — ABNORMAL HIGH (ref 8–23)
CO2: 20 mmol/L — ABNORMAL LOW (ref 22–32)
Calcium: 8.8 mg/dL — ABNORMAL LOW (ref 8.9–10.3)
Chloride: 106 mmol/L (ref 98–111)
Creatinine, Ser: 1.41 mg/dL — ABNORMAL HIGH (ref 0.61–1.24)
GFR, Estimated: 54 mL/min — ABNORMAL LOW (ref >60)
Glucose, Bld: 205 mg/dL — ABNORMAL HIGH (ref 70–99)
Potassium: 4.6 mmol/L (ref 3.5–5.1)
Sodium: 138 mmol/L (ref 135–145)

## 2023-09-26 LAB — GLUCOSE, CAPILLARY
Glucose-Capillary: 185 mg/dL — ABNORMAL HIGH (ref 70–99)
Glucose-Capillary: 153 mg/dL — ABNORMAL HIGH (ref 70–99)
Glucose-Capillary: 199 mg/dL — ABNORMAL HIGH (ref 70–99)
Glucose-Capillary: 223 mg/dL — ABNORMAL HIGH (ref 70–99)

## 2023-09-26 LAB — MRSA NEXT GEN BY PCR, NASAL: MRSA by PCR Next Gen: NOT DETECTED

## 2023-09-26 MED ORDER — METRONIDAZOLE 500 MG/100ML IV SOLN
500.0000 mg | Freq: Two times a day (BID) | INTRAVENOUS | Status: AC
  Administered 2023-09-26 – 2023-09-28 (×5): 500 mg via INTRAVENOUS
  Filled 2023-09-26 (×5): qty 100

## 2023-09-26 MED ORDER — VANCOMYCIN HCL 2000 MG/400ML IV SOLN
2000.0000 mg | INTRAVENOUS | Status: DC
  Administered 2023-09-26: 2000 mg via INTRAVENOUS
  Filled 2023-09-26 (×2): qty 400

## 2023-09-26 MED ORDER — TRANEXAMIC ACID 1000 MG/10ML IV SOLN
2000.0000 mg | INTRAVENOUS | Status: DC
  Filled 2023-09-26 (×2): qty 20

## 2023-09-26 MED ORDER — SODIUM CHLORIDE 0.9 % IV SOLN
2.0000 g | Freq: Three times a day (TID) | INTRAVENOUS | Status: DC
  Administered 2023-09-26 – 2023-09-27 (×3): 2 g via INTRAVENOUS
  Filled 2023-09-26 (×3): qty 12.5

## 2023-09-26 MED ORDER — HYDRALAZINE HCL 20 MG/ML IJ SOLN: 10.0000 mg | Freq: Four times a day (QID) | INTRAMUSCULAR | Status: DC | PRN

## 2023-09-26 NOTE — Plan of Care (Signed)
   Problem: Education: Goal: Knowledge of General Education information will improve Description Including pain rating scale, medication(s)/side effects and non-pharmacologic comfort measures Outcome: Progressing   Problem: Health Behavior/Discharge Planning: Goal: Ability to manage health-related needs will improve Outcome: Progressing

## 2023-09-26 NOTE — Consult Note (Signed)
 ORTHOPAEDIC CONSULTATION  REQUESTING PHYSICIAN: Jillian Buttery, MD  Chief Complaint: Chronic ulceration left heel.  HPI: Steve Alvarado is a 68 y.o. male who presents with several month history of left heel ulcer.  Patient states this was cared for by wound care at the facility.  Past Medical History:  Diagnosis Date   Acute kidney injury (HCC) 08/20/2021   Basal ganglia stroke (HCC)    a. 07/2021 L sided wkns/facial droop/fall-->MRI brain: nonhemorrhagic infarct of posterior R lentiform nucleus and corona radiata measure. Remote lacunar infarcts of post cerebellum bilat and L thalamus.   CHF (congestive heart failure) (HCC)    CKD (chronic kidney disease), stage II    a. 08/2021 AKI w/ creat up to 3.4.   Cocaine use    Coronary artery disease    a. 07/2010 PCI: LAD 37m, LCX 80 diff/small, OM1 47m, RCA 20m (3.0x18 Vision BMS). EF 50%; b. 07/2013 Cath: LM nl, LAD 30p, 20d, LCX 20d, LCX 100, OM1 60, RCA 30p, 36m, RPDA 90-->Med Rx; c. 10/2020 MV: EF 46% (60-65% by echo), no ischemia. 3V cor Ca2+.   Depression    Diabetes mellitus    Type II   Diastolic dysfunction    a. 10/2020 Echo: EF 60-65%, no rwma, mild LVH, GrI DD, nl RV size/fxn; b. 07/2021 Echo: EF 60-65%, no rwma, GrI DD, nl RV fxn, mildly dil LA, triv MR; c. 10/2021 Echo: EF 55-60%, no rwma, GrI DD, nl RV fxn, mild-mod MR, Asc Ao 37mm.   Hyperlipidemia    Hypertension    Hypertensive urgency 03/08/2015   Hypothyroidism    Past Surgical History:  Procedure Laterality Date   CARDIAC CATHETERIZATION  08/06/2010   Bare metal stent placed in RCA.   HAND SURGERY     left   Social History   Socioeconomic History   Marital status: Legally Separated    Spouse name: Not on file   Number of children: Not on file   Years of education: Not on file   Highest education level: Not on file  Occupational History   Not on file  Tobacco Use   Smoking status: Never   Smokeless tobacco: Never  Vaping Use   Vaping status: Never  Used  Substance and Sexual Activity   Alcohol use: No   Drug use: Yes    Types: Cocaine    Comment: crack   Sexual activity: Not on file  Other Topics Concern   Not on file  Social History Narrative   Not on file   Social Drivers of Health   Financial Resource Strain: High Risk (10/26/2020)   [facility] from [facility]   Overall Financial Resource Strain (CARDIA)    Difficulty of Paying Living Expenses: Hard  Food Insecurity: No Food Insecurity (09/25/2023)   Hunger Vital Sign    Worried About Running Out of Food in the Last Year: Never true    Ran Out of Food in the Last Year: Never true  Transportation Needs: No Transportation Needs (09/25/2023)   PRAPARE - Administrator, Civil Service (Medical): No    Lack of Transportation (Non-Medical): No  Physical Activity: Not on file  Stress: Not on file  Social Connections: Unknown (09/25/2023)   Social Connection and Isolation Panel    Frequency of Communication with Friends and Family: More than three times a week    Frequency of Social Gatherings with Friends and Family: Once a week    Attends Religious Services: Patient  declined    Active Member of Clubs or Organizations: Patient declined    Attends Banker Meetings: Patient declined    Marital Status: Patient declined   Family History  Problem Relation Age of Onset   Heart attack Mother    - negative except otherwise stated in the family history section Allergies  Allergen Reactions   Ace Inhibitors Cough    Reaction not listed on MAR   Prior to Admission medications   Medication Sig Start Date End Date Taking? Authorizing Provider  acetaminophen  (TYLENOL ) 500 MG tablet Take 500 mg by mouth in the morning and at bedtime.   Yes [provider]  albuterol  (VENTOLIN  HFA) 108 (90 Base) MCG/ACT inhaler Inhale 2 puffs into the lungs every 6 (six) hours as needed for wheezing.   Yes [provider]  amLODipine  (NORVASC ) 10 MG tablet Take  1 tablet (10 mg total) by mouth every morning. 08/19/21  Yes de Clint Kill, Cortney E, NP  aspirin  EC 81 MG tablet Take 1 tablet (81 mg total) by mouth daily. Swallow whole. Patient taking differently: Take 81 mg by mouth in the morning. Swallow whole. 08/18/21  Yes Shafer, Jorene, NP  atorvastatin  (LIPITOR) 40 MG tablet Take 1 tablet (40 mg total) by mouth every evening. Patient taking differently: Take 40 mg by mouth at bedtime. 10/23/20  Yes Maree Hue, MD  baclofen  (LIORESAL ) 10 MG tablet Take 10 mg by mouth 2 (two) times daily.   Yes [provider]  benzonatate  (TESSALON ) 100 MG capsule Take 1 capsule (100 mg total) by mouth every 8 (eight) hours as needed for cough. 04/07/23  Yes Veta Palma, PA-C  Calamine-Zinc  Oxide 8-8 % LOTN Apply 1 application  topically in the morning and at bedtime. Apply to scrotum topically every night and day shift for redness.   Yes [provider]  carboxymethylcellulose (REFRESH PLUS) 0.5 % SOLN Place 2 drops into both eyes every 4 (four) hours as needed (dry eyes).   Yes [provider]  carvedilol  (COREG ) 6.25 MG tablet Take 1 tablet (6.25 mg total) by mouth 2 (two) times daily with a meal. 09/01/22  Yes Fausto Sor A, DO  cetirizine (ZYRTEC) 10 MG tablet Take 10 mg by mouth at bedtime.   Yes [provider]  clopidogrel  (PLAVIX ) 75 MG tablet Take 1 tablet (75 mg total) by mouth daily. Patient taking differently: Take 75 mg by mouth in the morning. 09/02/22  Yes Fausto Sor A, DO  cycloSPORINE (RESTASIS) 0.05 % ophthalmic emulsion Place 1 drop into both eyes 2 (two) times daily.   Yes [provider]  dapagliflozin propanediol (FARXIGA) 5 MG TABS tablet Take 5 mg by mouth in the morning.   Yes [provider]  dextromethorphan-guaiFENesin  (ROBITUSSIN-DM) 10-100 MG/5ML liquid Take 10 mLs by mouth every 6 (six) hours as needed for cough.   Yes [provider]  DULoxetine  (CYMBALTA ) 60 MG capsule  Take 60 mg by mouth in the morning.   Yes [provider]  ferrous sulfate  325 (65 FE) MG EC tablet Take 1 tablet (325 mg total) by mouth every other day. Patient taking differently: Take 325 mg by mouth in the morning. 11/09/21 09/25/23 Yes Wouk, Devaughn Sayres, MD  furosemide  (LASIX ) 20 MG tablet Take 1 tablet (20 mg total) by mouth every other day. Monitor volume status and change dose accordingly. 07/28/22 09/25/23 Yes Von Bellis, MD  Glucagon, rDNA, (GLUCAGON EMERGENCY) 1 MG KIT Inject 1 g into the vein  as needed (hypoglycemia).   Yes [provider]  HYDROcodone -acetaminophen  (NORCO/VICODIN) 5-325 MG tablet Take 1 tablet by mouth every 6 (six) hours as needed for moderate pain (pain score 4-6). 09/08/23 09/28/23 Yes [provider]  insulin  lispro (HUMALOG  KWIKPEN) 100 UNIT/ML KwikPen Inject 5 Units into the skin 3 (three) times daily.   Yes [provider]  insulin  lispro (HUMALOG  KWIKPEN) 100 UNIT/ML KwikPen Inject 0-9 Units into the skin See admin instructions. Inject as per sliding scale: if 70-120 = 0, 121-150= 1 unit, 151-200 = 3 units, 201-300 = 5 units, 301-350 = 7 units, 351-400 = 9 units. Check 3 times daily.   Yes [provider]  loperamide  (IMODIUM  A-D) 2 MG tablet Take 2 mg by mouth as needed for diarrhea or loose stools.   Yes [provider]  Multiple Vitamin (MULTIVITAMIN WITH MINERALS) TABS tablet Take 1 tablet by mouth daily. Patient taking differently: Take 1 tablet by mouth in the morning. 09/02/22  Yes Fausto Sor A, DO  ondansetron  (ZOFRAN ) 4 MG tablet Take 4 mg by mouth every 8 (eight) hours as needed for nausea or vomiting.   Yes [provider]  polyethylene glycol (MIRALAX  / GLYCOLAX ) 17 g packet Take 17 g by mouth daily as needed for mild constipation. 08/22/21  Yes Dahal, Chapman, MD  pregabalin  (LYRICA ) 75 MG capsule Take 75 mg by mouth 2 (two) times daily.   Yes [provider]  sacubitril -valsartan   (ENTRESTO ) 24-26 MG Take 1 tablet by mouth 2 (two) times daily. 11/29/21  Yes Hackney, Tina A, FNP  senna-docusate (SENOKOT-S) 8.6-50 MG tablet Take 1 tablet by mouth 2 (two) times daily. Hold if having loose or frequent stools. Patient taking differently: Take 2 tablets by mouth 2 (two) times daily. Hold if having loose or frequent stools. 09/01/22  Yes Fausto Sor A, DO  UNABLE TO FIND 30 mLs by Other route in the morning and at bedtime. Liquid Protein 30cc   Yes [provider]  levothyroxine  (SYNTHROID ) 75 MCG tablet Take 75 mcg by mouth at bedtime. 2200 05/19/21   [provider]  melatonin 5 MG TABS Take 5 mg by mouth at bedtime.    [provider]  traZODone  (DESYREL ) 50 MG tablet Take 1 tablet (50 mg total) by mouth at bedtime as needed for sleep. 03/28/23   Bryn Bernardino NOVAK, MD   MR FOOT LEFT W WO CONTRAST Result Date: 09/26/2023 CLINICAL DATA:  Left heel pain/wound, diabetes EXAM: MRI OF THE LEFT HINDFOOT WITHOUT AND WITH CONTRAST TECHNIQUE: Multiplanar, multisequence MR imaging of the left hindfoot was performed both before and after administration of intravenous contrast. CONTRAST:  10mL GADAVIST  GADOBUTROL  1 MMOL/ML IV SOLN COMPARISON:  Radiographs 09/25/2023 FINDINGS: Bones/Joint/Cartilage Cortical erosion and marrow edema posteriorly along the calcaneus compatible with posterior calcaneal osteomyelitis. Associated marrow enhancement noted. Ligaments Thickened anterior talofibular ligament, query remote injury. Thickened superomedial portion of the spring ligament. Muscles and Tendons Mild distal tibialis posterior tendinopathy. Moderate distal Achilles tendinopathy. Thickened proximal plantar fascia, especially the medial band, favoring plantar fasciitis. Regional muscular atrophy. Soft tissues Ulceration posterior to the calcaneus in the vicinity of the Achilles attachment site. Diffuse subcutaneous edema along the ankle especially notable medially and laterally, and  tracking into the dorsum of the foot. IMPRESSION: 1. Posterior calcaneal osteomyelitis. 2. Ulceration posterior to the calcaneus in the vicinity of the Achilles attachment site. 3. Moderate distal Achilles tendinopathy. 4. Thickened proximal plantar fascia, especially the medial band, favoring plantar fasciitis. 5.  Mild distal tibialis posterior tendinopathy. 6. Thickened anterior talofibular ligament, query remote injury. 7. Diffuse subcutaneous edema along the ankle especially notable medially and laterally, and tracking into the dorsum of the foot. Electronically Signed   By: Ryan Salvage M.D.   On: 09/26/2023 08:06   DG Foot Complete Left Result Date: 09/25/2023 CLINICAL DATA:  L foot wound EXAM: LEFT FOOT - COMPLETE 3+ VIEW COMPARISON:  None Available. FINDINGS: Diffuse osteopenia.No acute fracture or dislocation. There is no evidence of arthropathy or other focal bone abnormality. Severe diffuse peripheral vascular atherosclerosis. Subcutaneous gas overlying the posterior calcaneal margin, likely due to underlying patient's wound. Cortical irregularity is also present in the underlying calcaneus, worrisome for osteomyelitis. IMPRESSION: Subcutaneous gas overlying the posterior calcaneal margin, likely representing patient's known wound. Cortical irregularity is also present in the underlying calcaneus, worrisome for osteomyelitis. A follow-up MRI of the foot is recommended for confirmation Electronically Signed   By: Rogelia Myers M.D.   On: 09/25/2023 14:30   - pertinent xrays, CT, MRI studies were reviewed and independently interpreted  Positive ROS: All other systems have been reviewed and were otherwise negative with the exception of those mentioned in the HPI and as above.  Physical Exam: General: Alert, no acute distress Psychiatric: Patient is competent for consent with normal mood and affect Lymphatic: No axillary or cervical lymphadenopathy Cardiovascular: No pedal  edema Respiratory: No cyanosis, no use of accessory musculature GI: No organomegaly, abdomen is soft and non-tender    Images:  @ENCIMAGES @  Labs:  Lab Results  Component Value Date   HGBA1C 10.2 (H) 03/26/2023   HGBA1C 11.1 (H) 07/25/2022   HGBA1C 7.9 (H) 08/13/2021   ESRSEDRATE 106 (H) 09/25/2023   CRP 5.9 (H) 09/25/2023   REPTSTATUS PENDING 09/25/2023   CULT  09/25/2023    NO GROWTH < 12 HOURS Performed at [facility], [address], [place], [place] [address]    LABORGA KLEBSIELLA PNEUMONIAE (A) 03/26/2023   LABORGA ESCHERICHIA COLI (A) 03/26/2023    Lab Results  Component Value Date   ALBUMIN 3.3 (L) 09/25/2023   ALBUMIN 3.7 06/05/2023   ALBUMIN 3.6 04/07/2023        Latest Ref Rng & Units 09/26/2023    6:21 AM 09/25/2023    3:15 PM 06/07/2023    6:53 AM  CBC EXTENDED  WBC 4.0 - 10.5 K/uL 9.8  11.1  4.4   RBC 4.22 - 5.81 MIL/uL 3.77  3.94  3.66   Hemoglobin 13.0 - 17.0 g/dL 89.2  88.7  89.2   HCT 39.0 - 52.0 % 33.0  35.3  32.8   Platelets 150 - 400 K/uL 383  401  211   NEUT# 1.7 - 7.7 K/uL  8.3    Lymph# 0.7 - 4.0 K/uL  1.5      Neurologic: Patient does not have protective sensation bilateral lower extremities.   MUSCULOSKELETAL:   Skin: Examination patient has a gangrenous ulcer involving both the calcaneus and Achilles.  His foot is warm but I cannot palpate a dorsalis pedis pulse.  He does have a palpable femoral pulse.  Review of the radiographs shows osteomyelitis of the calcaneus.  Review of the MRI scan also shows osteomyelitis of the calcaneus.  Patient does not have an ABI.  Hemoglobin 10.7 with a hemoglobin A1c of 10.2.  Sed rate 106 with a C-reactive protein of 6.9.  Patient is status post a stroke involving the left upper and left lower extremity.  He  has no active motor function of the ankle but does have active motor function of the knee.    Patient states he currently stands to pivot.  Assessment: Assessment: Osteomyelitis and  gangrenous ulceration left heel with peripheral vascular disease and diabetes.  Plan: Plan: Will plan for a left below-knee amputation.  Risk and benefits are discussed including risk of the wound not healing need for additional surgery.  I have ordered ankle-brachial indices for baseline study.  Thank you for the consult and the opportunity to see Mr. Mcevoy  Jerona Sage, MD [facility] [phone] 8:27 AM

## 2023-09-26 NOTE — H&P (View-Only) (Signed)
 ORTHOPAEDIC CONSULTATION  REQUESTING PHYSICIAN: Jillian Buttery, MD  Chief Complaint: Chronic ulceration left heel.  HPI: Steve Alvarado is a 68 y.o. male who presents with several month history of left heel ulcer.  Patient states this was cared for by wound care at the facility.  Past Medical History:  Diagnosis Date   Acute kidney injury (HCC) 08/20/2021   Basal ganglia stroke (HCC)    a. 07/2021 L sided wkns/facial droop/fall-->MRI brain: nonhemorrhagic infarct of posterior R lentiform nucleus and corona radiata measure. Remote lacunar infarcts of post cerebellum bilat and L thalamus.   CHF (congestive heart failure) (HCC)    CKD (chronic kidney disease), stage II    a. 08/2021 AKI w/ creat up to 3.4.   Cocaine use    Coronary artery disease    a. 07/2010 PCI: LAD 37m, LCX 80 diff/small, OM1 47m, RCA 20m (3.0x18 Vision BMS). EF 50%; b. 07/2013 Cath: LM nl, LAD 30p, 20d, LCX 20d, LCX 100, OM1 60, RCA 30p, 36m, RPDA 90-->Med Rx; c. 10/2020 MV: EF 46% (60-65% by echo), no ischemia. 3V cor Ca2+.   Depression    Diabetes mellitus    Type II   Diastolic dysfunction    a. 10/2020 Echo: EF 60-65%, no rwma, mild LVH, GrI DD, nl RV size/fxn; b. 07/2021 Echo: EF 60-65%, no rwma, GrI DD, nl RV fxn, mildly dil LA, triv MR; c. 10/2021 Echo: EF 55-60%, no rwma, GrI DD, nl RV fxn, mild-mod MR, Asc Ao 37mm.   Hyperlipidemia    Hypertension    Hypertensive urgency 03/08/2015   Hypothyroidism    Past Surgical History:  Procedure Laterality Date   CARDIAC CATHETERIZATION  08/06/2010   Bare metal stent placed in RCA.   HAND SURGERY     left   Social History   Socioeconomic History   Marital status: Legally Separated    Spouse name: Not on file   Number of children: Not on file   Years of education: Not on file   Highest education level: Not on file  Occupational History   Not on file  Tobacco Use   Smoking status: Never   Smokeless tobacco: Never  Vaping Use   Vaping status: Never  Used  Substance and Sexual Activity   Alcohol use: No   Drug use: Yes    Types: Cocaine    Comment: crack   Sexual activity: Not on file  Other Topics Concern   Not on file  Social History Narrative   Not on file   Social Drivers of Health   Financial Resource Strain: High Risk (10/26/2020)   Received from [facility]   Overall Financial Resource Strain (CARDIA)    Difficulty of Paying Living Expenses: Hard  Food Insecurity: No Food Insecurity (09/25/2023)   Hunger Vital Sign    Worried About Running Out of Food in the Last Year: Never true    Ran Out of Food in the Last Year: Never true  Transportation Needs: No Transportation Needs (09/25/2023)   PRAPARE - Administrator, Civil Service (Medical): No    Lack of Transportation (Non-Medical): No  Physical Activity: Not on file  Stress: Not on file  Social Connections: Unknown (09/25/2023)   Social Connection and Isolation Panel    Frequency of Communication with Friends and Family: More than three times a week    Frequency of Social Gatherings with Friends and Family: Once a week    Attends Religious Services: Patient  declined    Active Member of Clubs or Organizations: Patient declined    Attends Banker Meetings: Patient declined    Marital Status: Patient declined   Family History  Problem Relation Age of Onset   Heart attack Mother    - negative except otherwise stated in the family history section Allergies  Allergen Reactions   Ace Inhibitors Cough    Reaction not listed on MAR   Prior to Admission medications   Medication Sig Start Date End Date Taking? Authorizing Provider  acetaminophen  (TYLENOL ) 500 MG tablet Take 500 mg by mouth in the morning and at bedtime.   Yes [provider]  albuterol  (VENTOLIN  HFA) 108 (90 Base) MCG/ACT inhaler Inhale 2 puffs into the lungs every 6 (six) hours as needed for wheezing.   Yes [provider]  amLODipine  (NORVASC ) 10 MG tablet Take  1 tablet (10 mg total) by mouth every morning. 08/19/21  Yes de Clint Kill, Cortney E, NP  aspirin  EC 81 MG tablet Take 1 tablet (81 mg total) by mouth daily. Swallow whole. Patient taking differently: Take 81 mg by mouth in the morning. Swallow whole. 08/18/21  Yes Shafer, Jorene, NP  atorvastatin  (LIPITOR) 40 MG tablet Take 1 tablet (40 mg total) by mouth every evening. Patient taking differently: Take 40 mg by mouth at bedtime. 10/23/20  Yes Maree Hue, MD  baclofen  (LIORESAL ) 10 MG tablet Take 10 mg by mouth 2 (two) times daily.   Yes [provider]  benzonatate  (TESSALON ) 100 MG capsule Take 1 capsule (100 mg total) by mouth every 8 (eight) hours as needed for cough. 04/07/23  Yes Veta Palma, PA-C  Calamine-Zinc  Oxide 8-8 % LOTN Apply 1 application  topically in the morning and at bedtime. Apply to scrotum topically every night and day shift for redness.   Yes [provider]  carboxymethylcellulose (REFRESH PLUS) 0.5 % SOLN Place 2 drops into both eyes every 4 (four) hours as needed (dry eyes).   Yes [provider]  carvedilol  (COREG ) 6.25 MG tablet Take 1 tablet (6.25 mg total) by mouth 2 (two) times daily with a meal. 09/01/22  Yes Fausto Sor A, DO  cetirizine (ZYRTEC) 10 MG tablet Take 10 mg by mouth at bedtime.   Yes [provider]  clopidogrel  (PLAVIX ) 75 MG tablet Take 1 tablet (75 mg total) by mouth daily. Patient taking differently: Take 75 mg by mouth in the morning. 09/02/22  Yes Fausto Sor A, DO  cycloSPORINE (RESTASIS) 0.05 % ophthalmic emulsion Place 1 drop into both eyes 2 (two) times daily.   Yes [provider]  dapagliflozin propanediol (FARXIGA) 5 MG TABS tablet Take 5 mg by mouth in the morning.   Yes [provider]  dextromethorphan-guaiFENesin  (ROBITUSSIN-DM) 10-100 MG/5ML liquid Take 10 mLs by mouth every 6 (six) hours as needed for cough.   Yes [provider]  DULoxetine  (CYMBALTA ) 60 MG capsule  Take 60 mg by mouth in the morning.   Yes [provider]  ferrous sulfate  325 (65 FE) MG EC tablet Take 1 tablet (325 mg total) by mouth every other day. Patient taking differently: Take 325 mg by mouth in the morning. 11/09/21 09/25/23 Yes Wouk, Devaughn Sayres, MD  furosemide  (LASIX ) 20 MG tablet Take 1 tablet (20 mg total) by mouth every other day. Monitor volume status and change dose accordingly. 07/28/22 09/25/23 Yes Von Bellis, MD  Glucagon, rDNA, (GLUCAGON EMERGENCY) 1 MG KIT Inject 1 g into the vein  as needed (hypoglycemia).   Yes [provider]  HYDROcodone -acetaminophen  (NORCO/VICODIN) 5-325 MG tablet Take 1 tablet by mouth every 6 (six) hours as needed for moderate pain (pain score 4-6). 09/08/23 09/28/23 Yes [provider]  insulin  lispro (HUMALOG  KWIKPEN) 100 UNIT/ML KwikPen Inject 5 Units into the skin 3 (three) times daily.   Yes [provider]  insulin  lispro (HUMALOG  KWIKPEN) 100 UNIT/ML KwikPen Inject 0-9 Units into the skin See admin instructions. Inject as per sliding scale: if 70-120 = 0, 121-150= 1 unit, 151-200 = 3 units, 201-300 = 5 units, 301-350 = 7 units, 351-400 = 9 units. Check 3 times daily.   Yes [provider]  loperamide  (IMODIUM  A-D) 2 MG tablet Take 2 mg by mouth as needed for diarrhea or loose stools.   Yes [provider]  Multiple Vitamin (MULTIVITAMIN WITH MINERALS) TABS tablet Take 1 tablet by mouth daily. Patient taking differently: Take 1 tablet by mouth in the morning. 09/02/22  Yes Fausto Sor A, DO  ondansetron  (ZOFRAN ) 4 MG tablet Take 4 mg by mouth every 8 (eight) hours as needed for nausea or vomiting.   Yes [provider]  polyethylene glycol (MIRALAX  / GLYCOLAX ) 17 g packet Take 17 g by mouth daily as needed for mild constipation. 08/22/21  Yes Dahal, Chapman, MD  pregabalin  (LYRICA ) 75 MG capsule Take 75 mg by mouth 2 (two) times daily.   Yes [provider]  sacubitril -valsartan   (ENTRESTO ) 24-26 MG Take 1 tablet by mouth 2 (two) times daily. 11/29/21  Yes Hackney, Tina A, FNP  senna-docusate (SENOKOT-S) 8.6-50 MG tablet Take 1 tablet by mouth 2 (two) times daily. Hold if having loose or frequent stools. Patient taking differently: Take 2 tablets by mouth 2 (two) times daily. Hold if having loose or frequent stools. 09/01/22  Yes Fausto Sor A, DO  UNABLE TO FIND 30 mLs by Other route in the morning and at bedtime. Liquid Protein 30cc   Yes [provider]  levothyroxine  (SYNTHROID ) 75 MCG tablet Take 75 mcg by mouth at bedtime. 2200 05/19/21   [provider]  melatonin 5 MG TABS Take 5 mg by mouth at bedtime.    [provider]  traZODone  (DESYREL ) 50 MG tablet Take 1 tablet (50 mg total) by mouth at bedtime as needed for sleep. 03/28/23   Bryn Bernardino NOVAK, MD   MR FOOT LEFT W WO CONTRAST Result Date: 09/26/2023 CLINICAL DATA:  Left heel pain/wound, diabetes EXAM: MRI OF THE LEFT HINDFOOT WITHOUT AND WITH CONTRAST TECHNIQUE: Multiplanar, multisequence MR imaging of the left hindfoot was performed both before and after administration of intravenous contrast. CONTRAST:  10mL GADAVIST  GADOBUTROL  1 MMOL/ML IV SOLN COMPARISON:  Radiographs 09/25/2023 FINDINGS: Bones/Joint/Cartilage Cortical erosion and marrow edema posteriorly along the calcaneus compatible with posterior calcaneal osteomyelitis. Associated marrow enhancement noted. Ligaments Thickened anterior talofibular ligament, query remote injury. Thickened superomedial portion of the spring ligament. Muscles and Tendons Mild distal tibialis posterior tendinopathy. Moderate distal Achilles tendinopathy. Thickened proximal plantar fascia, especially the medial band, favoring plantar fasciitis. Regional muscular atrophy. Soft tissues Ulceration posterior to the calcaneus in the vicinity of the Achilles attachment site. Diffuse subcutaneous edema along the ankle especially notable medially and laterally, and  tracking into the dorsum of the foot. IMPRESSION: 1. Posterior calcaneal osteomyelitis. 2. Ulceration posterior to the calcaneus in the vicinity of the Achilles attachment site. 3. Moderate distal Achilles tendinopathy. 4. Thickened proximal plantar fascia, especially the medial band, favoring plantar fasciitis. 5.  Mild distal tibialis posterior tendinopathy. 6. Thickened anterior talofibular ligament, query remote injury. 7. Diffuse subcutaneous edema along the ankle especially notable medially and laterally, and tracking into the dorsum of the foot. Electronically Signed   By: Ryan Salvage M.D.   On: 09/26/2023 08:06   DG Foot Complete Left Result Date: 09/25/2023 CLINICAL DATA:  L foot wound EXAM: LEFT FOOT - COMPLETE 3+ VIEW COMPARISON:  None Available. FINDINGS: Diffuse osteopenia.No acute fracture or dislocation. There is no evidence of arthropathy or other focal bone abnormality. Severe diffuse peripheral vascular atherosclerosis. Subcutaneous gas overlying the posterior calcaneal margin, likely due to underlying patient's wound. Cortical irregularity is also present in the underlying calcaneus, worrisome for osteomyelitis. IMPRESSION: Subcutaneous gas overlying the posterior calcaneal margin, likely representing patient's known wound. Cortical irregularity is also present in the underlying calcaneus, worrisome for osteomyelitis. A follow-up MRI of the foot is recommended for confirmation Electronically Signed   By: Rogelia Myers M.D.   On: 09/25/2023 14:30   - pertinent xrays, CT, MRI studies were reviewed and independently interpreted  Positive ROS: All other systems have been reviewed and were otherwise negative with the exception of those mentioned in the HPI and as above.  Physical Exam: General: Alert, no acute distress Psychiatric: Patient is competent for consent with normal mood and affect Lymphatic: No axillary or cervical lymphadenopathy Cardiovascular: No pedal  edema Respiratory: No cyanosis, no use of accessory musculature GI: No organomegaly, abdomen is soft and non-tender    Images:  @ENCIMAGES @  Labs:  Lab Results  Component Value Date   HGBA1C 10.2 (H) 03/26/2023   HGBA1C 11.1 (H) 07/25/2022   HGBA1C 7.9 (H) 08/13/2021   ESRSEDRATE 106 (H) 09/25/2023   CRP 5.9 (H) 09/25/2023   REPTSTATUS PENDING 09/25/2023   CULT  09/25/2023    NO GROWTH < 12 HOURS Performed at [facility], [address], [place], [place] [address]    LABORGA KLEBSIELLA PNEUMONIAE (A) 03/26/2023   LABORGA ESCHERICHIA COLI (A) 03/26/2023    Lab Results  Component Value Date   ALBUMIN 3.3 (L) 09/25/2023   ALBUMIN 3.7 06/05/2023   ALBUMIN 3.6 04/07/2023        Latest Ref Rng & Units 09/26/2023    6:21 AM 09/25/2023    3:15 PM 06/07/2023    6:53 AM  CBC EXTENDED  WBC 4.0 - 10.5 K/uL 9.8  11.1  4.4   RBC 4.22 - 5.81 MIL/uL 3.77  3.94  3.66   Hemoglobin 13.0 - 17.0 g/dL 89.2  88.7  89.2   HCT 39.0 - 52.0 % 33.0  35.3  32.8   Platelets 150 - 400 K/uL 383  401  211   NEUT# 1.7 - 7.7 K/uL  8.3    Lymph# 0.7 - 4.0 K/uL  1.5      Neurologic: Patient does not have protective sensation bilateral lower extremities.   MUSCULOSKELETAL:   Skin: Examination patient has a gangrenous ulcer involving both the calcaneus and Achilles.  His foot is warm but I cannot palpate a dorsalis pedis pulse.  He does have a palpable femoral pulse.  Review of the radiographs shows osteomyelitis of the calcaneus.  Review of the MRI scan also shows osteomyelitis of the calcaneus.  Patient does not have an ABI.  Hemoglobin 10.7 with a hemoglobin A1c of 10.2.  Sed rate 106 with a C-reactive protein of 6.9.  Patient is status post a stroke involving the left upper and left lower extremity.  He  has no active motor function of the ankle but does have active motor function of the knee.    Patient states he currently stands to pivot.  Assessment: Assessment: Osteomyelitis and  gangrenous ulceration left heel with peripheral vascular disease and diabetes.  Plan: Plan: Will plan for a left below-knee amputation.  Risk and benefits are discussed including risk of the wound not healing need for additional surgery.  I have ordered ankle-brachial indices for baseline study.  Thank you for the consult and the opportunity to see Mr. Mcevoy  Jerona Sage, MD [facility] [phone] 8:27 AM

## 2023-09-26 NOTE — Progress Notes (Signed)
 Pharmacy Antibiotic Note  Steve Alvarado is a 68 y.o. male admitted on 09/25/2023 with heel wound x 3 weeks, found to have osteomyelitis and gangrenous ulceration.  Pharmacy has been consulted for vancomycin  and cefepime  dosing.  Patient received antibiotics in the ED.  Renal function trending up, afebrile, WBC WNL Noted plan for BKA  Plan: Vanc 2gm IV Q24H for AUC 524 using SCr 1.41 Cefepime  2gm IV Q8H Flagyl  500mg  IV Q12H Monitor renal fxn, clinical progress, vanc levels if indicated   Height: 6' 2 (188 cm) Weight: 101.4 kg (223 lb 8.7 oz) IBW/kg (Calculated) : 82.2  Temp (24hrs), Avg:99.7 F (37.6 C), Min:98.6 F (37 C), Max:100.8 F (38.2 C)  Recent Labs  Lab 09/25/23 1515 09/26/23 0621  WBC 11.1* 9.8  CREATININE 1.27* 1.41*    Estimated Creatinine Clearance: 62.9 mL/min (A) (by C-G formula based on SCr of 1.41 mg/dL (H)).    Allergies  Allergen Reactions   Ace Inhibitors Cough    Reaction not listed on MAR    Vanc 7/8 >> Cefepime  7/8 >>  Flagyl  7/8 >> Unasyn /Zyvox  x1 7/7   7/7 BCx -  7/8 MRSA PCR -   Hendel Gatliff D. Lendell, PharmD, BCPS, BCCCP 09/26/2023, 8:46 AM

## 2023-09-26 NOTE — Consult Note (Signed)
 See WOC consult note for L heel 09/25/2023  WOC Nurse Consult Note: Reason for Consult: pressure injury sacrum/buttock  Wound type: Stage 2 Pressure Injury sacrum/buttock  Pressure Injury POA: Yes Measurement: see nursing flowsheet (2 open areas noted separated by intact skin)  Wound bed: appears pink dry  Drainage (amount, consistency, odor) see nursing flowsheet  Periwound: appears intact  Dressing procedure/placement/frequency: Cleanse sacral/buttocks wounds with NS, apply Xeroform gauze ([place] [phone]) to wound bed daily and secure with silicone foam.    POC discussed with bedside nurse WOC team will not follow.  Re-consult if further needs arise.   Thank you,    Powell Bar MSN, RN-BC, [employer]

## 2023-09-26 NOTE — Progress Notes (Signed)
 PROGRESS NOTE  Steve Alvarado  FMW:[id number] DOB: 01/02/1956 DOA: 09/25/2023 PCP: Celinda Chrystal HERO, MD   Brief Narrative: Patient is a 68 year old male with history of stroke with residual hemiparesis on the left, CKD, coronary artery artery disease, hypertension, hyperlipidemia, hypothyroidism who presented with complaint of left heel pain.  Pain started since last 3 weeks and also develop  into open wound.  On presentation, lab work showed WBC count of 11.1, creatinine 1.2, ESR of 106.  X-ray of the foot showed subcutaneous gas concerning for possible osteo.  MRI  showed posterior calcaneal osteomyelitis.  Started on broad-spectrum antibiotics, cultures sent.  Orthopedics planning for left below-knee amputation.  ABI ordered.  Assessment & Plan:  Principal Problem:   Acute osteomyelitis of calcaneum, unspecified laterality (HCC)  Osteomyelitis of the left calcaneus: Presented with left foot pain, open.  Inflammatory  markers elevated.  X-ray/MRI suggestive of osteomyelitis.  Dr. Harden consulted. Planning for left below-knee amputation.  ABI ordered.  Continue broad-spectrum antibiotics  Hypertension: Currently mildly hypertensive.  On amlodipine , Coreg .  Restarted continue as needed medications for severe hypertension.  History of CVA: On aspirin , Plavix  at home. Plavix   on hold for possible surgical intervention.  Will continue aspirin .  He has residual hemiparesis on the left.  He is mostly nonambulatory.  Lives in nursing facility  Hypothyroidism: Continue levothyroxine   CKD stage IIIa: Baseline creatinine from 1.5-1.8.  Currently kidney function at baseline.  Chronic pain syndrome: On Lyrica   HFpEF: Last echo done August 2023 showed EF of 55 to 60%, normal is mostly abnormality, grade 1 diastolic dysfunction.  Currently he is euvolemic.  On Entresto   Insomnia: On trazodone        DVT prophylaxis:SCDs Start: 09/25/23 1852     Code Status: Full Code  Family Communication:  Discussed with daughter on phone from bedside on 7/8  Patient status:Inpatient  Patient is from :Home  Anticipated discharge un:ynfz  Estimated DC date:2-3 days,plan for surgery   Consultants: Orthopedics  Procedures:None yet  Antimicrobials:  Anti-infectives (From admission, onward)    Start     Dose/Rate Route Frequency Ordered Stop   09/25/23 1500  Ampicillin -Sulbactam (UNASYN ) 3 g in sodium chloride  0.9 % 100 mL IVPB       Placed in And Linked Group   3 g 200 mL/hr over 30 Minutes Intravenous  Once 09/25/23 1445 09/25/23 1610   09/25/23 1500  linezolid  (ZYVOX ) tablet 600 mg       Placed in And Linked Group   600 mg Oral  Once 09/25/23 1445 09/25/23 1535       Subjective: Patient seen and examined at bedside today.  He looks overall comfortable.  Lying in bed.  Alert and oriented.  Not in significant distress.  Looks very weak and deconditioned.  Has residual weakness on the left side from prior CVA.  I talked to his daughter on phone from the bedside.  Patient understands that he has been planned  for amputation  Objective: Vitals:   09/25/23 2302 09/26/23 0016 09/26/23 0643 09/26/23 0714  BP: (!) 166/74  (!) 162/84 (!) 160/82  Pulse: 86   79  Resp: 18  18   Temp: (!) 100.5 F (38.1 C) 99.8 F (37.7 C) 99.5 F (37.5 C)   TempSrc: Oral Axillary Axillary   SpO2:   96% 100%  Weight: 101.4 kg     Height: 6' 2 (1.88 m)       Intake/Output Summary (Last 24 hours) at 09/26/2023 0753 Last  data filed at 09/25/2023 1610 Gross per 24 hour  Intake 95.01 ml  Output --  Net 95.01 ml   Filed Weights   09/25/23 2302  Weight: 101.4 kg    Examination:  General exam: Overall comfortable, not in distress, weak and deconditioned HEENT: PERRL Respiratory system:  no wheezes or crackles  Cardiovascular system: S1 & S2 heard, RRR.  Gastrointestinal system: Abdomen is nondistended, soft and nontender. Central nervous system: Alert and oriented ,left sided residual  hemiparesis Extremities: Ulcer on the left heel Skin: No rashes, no icterus     Data Reviewed: I have personally reviewed following labs and imaging studies  CBC: Recent Labs  Lab 09/25/23 1515 09/26/23 0621  WBC 11.1* 9.8  NEUTROABS 8.3*  --   HGB 11.2* 10.7*  HCT 35.3* 33.0*  MCV 89.6 87.5  PLT 401* 383   Basic Metabolic Panel: Recent Labs  Lab 09/25/23 1515 09/26/23 0621  NA 136 138  K 4.8 4.6  CL 104 106  CO2 23 20*  GLUCOSE 224* 205*  BUN 37* 35*  CREATININE 1.27* 1.41*  CALCIUM  8.9 8.8*     Recent Results (from the past 240 hours)  Blood Cultures x 2 sites     Status: None (Preliminary result)   Collection Time: 09/25/23  3:15 PM   Specimen: BLOOD  Result Value Ref Range Status   Specimen Description   Final    BLOOD SITE NOT SPECIFIED Performed at [facility], [address] [place], [place] [address]    Special Requests   Final    BOTTLES DRAWN AEROBIC AND ANAEROBIC Blood Culture adequate volume Performed at [facility], [address] [place], [place] [address]    Culture   Final    NO GROWTH < 12 HOURS Performed at [facility], [address] [place], [place] [address]    Report Status PENDING  Incomplete  Blood Cultures x 2 sites     Status: None (Preliminary result)   Collection Time: 09/25/23 11:07 PM   Specimen: BLOOD  Result Value Ref Range Status   Specimen Description BLOOD SITE NOT SPECIFIED  Final   Special Requests   Final    BOTTLES DRAWN AEROBIC AND ANAEROBIC Blood Culture results may not be optimal due to an inadequate volume of blood received in culture bottles   Culture   Final    NO GROWTH < 12 HOURS Performed at [facility], [address] [place], [place] [address]    Report Status PENDING  Incomplete     Radiology Studies: DG Foot Complete Left Result Date: 09/25/2023 CLINICAL DATA:  L foot wound EXAM: LEFT FOOT - COMPLETE 3+ VIEW COMPARISON:  None Available.  FINDINGS: Diffuse osteopenia.No acute fracture or dislocation. There is no evidence of arthropathy or other focal bone abnormality. Severe diffuse peripheral vascular atherosclerosis. Subcutaneous gas overlying the posterior calcaneal margin, likely due to underlying patient's wound. Cortical irregularity is also present in the underlying calcaneus, worrisome for osteomyelitis. IMPRESSION: Subcutaneous gas overlying the posterior calcaneal margin, likely representing patient's known wound. Cortical irregularity is also present in the underlying calcaneus, worrisome for osteomyelitis. A follow-up MRI of the foot is recommended for confirmation Electronically Signed   By: Rogelia Myers M.D.   On: 09/25/2023 14:30    Scheduled Meds:  amLODipine   10 mg Oral q morning   aspirin  EC  81 mg Oral Daily   atorvastatin   40 mg Oral QPM   baclofen   10 mg Oral BID   carvedilol   6.25 mg Oral BID WC   DULoxetine   60 mg Oral Daily   insulin  aspart  0-24 Units Subcutaneous TID WC   leptospermum manuka honey  1 Application Topical Daily   levothyroxine   75 mcg Oral QHS   pneumococcal 20-valent conjugate vaccine  0.5 mL Intramuscular Tomorrow-1000   pregabalin   75 mg Oral BID   sacubitril -valsartan   1 tablet Oral BID   Continuous Infusions:   LOS: 1 day   Ivonne Mustache, MD [facility] P7/10/2023, 7:53 AM

## 2023-09-27 ENCOUNTER — Encounter (HOSPITAL_COMMUNITY)
Admission: EM | Disposition: A | Discharge status: Skilled Nursing Facility | Payer: Self-pay | Source: Skilled Nursing Facility | Attending: Internal Medicine

## 2023-09-27 ENCOUNTER — Encounter (HOSPITAL_COMMUNITY): Payer: Self-pay | Admitting: Internal Medicine

## 2023-09-27 ENCOUNTER — Other Ambulatory Visit: Payer: Self-pay

## 2023-09-27 ENCOUNTER — Inpatient Hospital Stay (HOSPITAL_COMMUNITY)

## 2023-09-27 ENCOUNTER — Encounter (HOSPITAL_COMMUNITY)

## 2023-09-27 DIAGNOSIS — M869 Osteomyelitis, unspecified: Secondary | ICD-10-CM | POA: Diagnosis not present

## 2023-09-27 DIAGNOSIS — L89624 Pressure ulcer of left heel, stage 4: Secondary | ICD-10-CM

## 2023-09-27 DIAGNOSIS — M86272 Subacute osteomyelitis, left ankle and foot: Secondary | ICD-10-CM

## 2023-09-27 DIAGNOSIS — I96 Gangrene, not elsewhere classified: Secondary | ICD-10-CM | POA: Diagnosis not present

## 2023-09-27 DIAGNOSIS — E1169 Type 2 diabetes mellitus with other specified complication: Secondary | ICD-10-CM | POA: Diagnosis not present

## 2023-09-27 DIAGNOSIS — M86172 Other acute osteomyelitis, left ankle and foot: Secondary | ICD-10-CM | POA: Diagnosis not present

## 2023-09-27 DIAGNOSIS — M86179 Other acute osteomyelitis, unspecified ankle and foot: Secondary | ICD-10-CM | POA: Diagnosis not present

## 2023-09-27 DIAGNOSIS — E039 Hypothyroidism, unspecified: Secondary | ICD-10-CM

## 2023-09-27 DIAGNOSIS — I2510 Atherosclerotic heart disease of native coronary artery without angina pectoris: Secondary | ICD-10-CM | POA: Diagnosis not present

## 2023-09-27 HISTORY — PX: APPLICATION OF WOUND VAC: SHX5189

## 2023-09-27 HISTORY — PX: AMPUTATION: SHX166

## 2023-09-27 LAB — CBC WITH DIFFERENTIAL/PLATELET
Abs Immature Granulocytes: 0.03 K/uL (ref 0.00–0.07)
Basophils Absolute: 0 K/uL (ref 0.0–0.1)
Basophils Relative: 0 %
Eosinophils Absolute: 0.3 K/uL (ref 0.0–0.5)
Eosinophils Relative: 4 %
HCT: 33 % — ABNORMAL LOW (ref 39.0–52.0)
Hemoglobin: 10.7 g/dL — ABNORMAL LOW (ref 13.0–17.0)
Immature Granulocytes: 0 %
Lymphocytes Relative: 10 %
Lymphs Abs: 1 K/uL (ref 0.7–4.0)
MCH: 28.8 pg (ref 26.0–34.0)
MCHC: 32.4 g/dL (ref 30.0–36.0)
MCV: 88.7 fL (ref 80.0–100.0)
Monocytes Absolute: 0.7 K/uL (ref 0.1–1.0)
Monocytes Relative: 8 %
Neutro Abs: 7.5 K/uL (ref 1.7–7.7)
Neutrophils Relative %: 78 %
Platelets: 307 K/uL (ref 150–400)
RBC: 3.72 MIL/uL — ABNORMAL LOW (ref 4.22–5.81)
RDW: 13 % (ref 11.5–15.5)
WBC: 9.6 K/uL (ref 4.0–10.5)
nRBC: 0 % (ref 0.0–0.2)

## 2023-09-27 LAB — GLUCOSE, CAPILLARY
Glucose-Capillary: 191 mg/dL — ABNORMAL HIGH (ref 70–99)
Glucose-Capillary: 190 mg/dL — ABNORMAL HIGH (ref 70–99)
Glucose-Capillary: 202 mg/dL — ABNORMAL HIGH (ref 70–99)
Glucose-Capillary: 228 mg/dL — ABNORMAL HIGH (ref 70–99)
Glucose-Capillary: 216 mg/dL — ABNORMAL HIGH (ref 70–99)
Glucose-Capillary: 194 mg/dL — ABNORMAL HIGH (ref 70–99)
Glucose-Capillary: 202 mg/dL — ABNORMAL HIGH (ref 70–99)

## 2023-09-27 LAB — BASIC METABOLIC PANEL WITH GFR
Anion gap: 11 (ref 5–15)
BUN: 44 mg/dL — ABNORMAL HIGH (ref 8–23)
CO2: 19 mmol/L — ABNORMAL LOW (ref 22–32)
Calcium: 7.9 mg/dL — ABNORMAL LOW (ref 8.9–10.3)
Chloride: 101 mmol/L (ref 98–111)
Creatinine, Ser: 1.87 mg/dL — ABNORMAL HIGH (ref 0.61–1.24)
GFR, Estimated: 38 mL/min — ABNORMAL LOW (ref >60)
Glucose, Bld: 217 mg/dL — ABNORMAL HIGH (ref 70–99)
Potassium: 4.2 mmol/L (ref 3.5–5.1)
Sodium: 131 mmol/L — ABNORMAL LOW (ref 135–145)

## 2023-09-27 LAB — CBC
HCT: 33.3 % — ABNORMAL LOW (ref 39.0–52.0)
Hemoglobin: 10.7 g/dL — ABNORMAL LOW (ref 13.0–17.0)
MCH: 28.8 pg (ref 26.0–34.0)
MCHC: 32.1 g/dL (ref 30.0–36.0)
MCV: 89.8 fL (ref 80.0–100.0)
Platelets: 295 K/uL (ref 150–400)
RBC: 3.71 MIL/uL — ABNORMAL LOW (ref 4.22–5.81)
RDW: 12.7 % (ref 11.5–15.5)
WBC: 9.4 K/uL (ref 4.0–10.5)
nRBC: 0 % (ref 0.0–0.2)

## 2023-09-27 LAB — VAS US ABI WITH/WO TBI

## 2023-09-27 LAB — PREALBUMIN: Prealbumin: 14 mg/dL — ABNORMAL LOW (ref 18–38)

## 2023-09-27 SURGERY — AMPUTATION BELOW KNEE
Anesthesia: Monitor Anesthesia Care | Site: Knee | Laterality: Left

## 2023-09-27 MED ORDER — CHLORHEXIDINE GLUCONATE 0.12 % MT SOLN: 15.0000 mL | Freq: Once | OROMUCOSAL | Status: AC

## 2023-09-27 MED ORDER — POVIDONE-IODINE 10 % EX SWAB: 2.0000 | Freq: Once | CUTANEOUS | Status: DC

## 2023-09-27 MED ORDER — PROPOFOL 10 MG/ML IV BOLUS
INTRAVENOUS | Status: DC | PRN
  Administered 2023-09-27: 150 mg via INTRAVENOUS

## 2023-09-27 MED ORDER — DEXAMETHASONE SODIUM PHOSPHATE 10 MG/ML IJ SOLN
INTRAMUSCULAR | Status: AC
  Filled 2023-09-27: qty 1

## 2023-09-27 MED ORDER — LIDOCAINE 2% (20 MG/ML) 5 ML SYRINGE
INTRAMUSCULAR | Status: AC
  Filled 2023-09-27: qty 5

## 2023-09-27 MED ORDER — MEPERIDINE HCL 25 MG/ML IJ SOLN: 6.2500 mg | INTRAMUSCULAR | Status: DC | PRN

## 2023-09-27 MED ORDER — CHLORHEXIDINE GLUCONATE 4 % EX SOLN: 60.0000 mL | Freq: Once | CUTANEOUS | Status: DC

## 2023-09-27 MED ORDER — OXYCODONE HCL 5 MG PO TABS
10.0000 mg | ORAL_TABLET | ORAL | Status: DC | PRN
  Administered 2023-09-28: 10 mg via ORAL
  Filled 2023-09-27: qty 2

## 2023-09-27 MED ORDER — FENTANYL CITRATE (PF) 100 MCG/2ML IJ SOLN: 50.0000 ug | Freq: Once | INTRAMUSCULAR | Status: DC

## 2023-09-27 MED ORDER — TRANEXAMIC ACID-NACL 1000-0.7 MG/100ML-% IV SOLN
1000.0000 mg | INTRAVENOUS | Status: AC
  Administered 2023-09-27: 1000 mg via INTRAVENOUS
  Filled 2023-09-27: qty 100

## 2023-09-27 MED ORDER — POTASSIUM CHLORIDE CRYS ER 20 MEQ PO TBCR: 40.0000 meq | EXTENDED_RELEASE_TABLET | Freq: Every day | ORAL | Status: DC | PRN

## 2023-09-27 MED ORDER — ACETAMINOPHEN 500 MG PO TABS
1000.0000 mg | ORAL_TABLET | Freq: Four times a day (QID) | ORAL | Status: AC
  Administered 2023-09-27 – 2023-09-28 (×4): 1000 mg via ORAL
  Filled 2023-09-27 (×4): qty 2

## 2023-09-27 MED ORDER — SODIUM CHLORIDE 0.9% FLUSH
3.0000 mL | Freq: Two times a day (BID) | INTRAVENOUS | Status: DC
  Administered 2023-09-28 – 2023-10-12 (×27): 3 mL via INTRAVENOUS

## 2023-09-27 MED ORDER — METOPROLOL TARTRATE 5 MG/5ML IV SOLN: 2.0000 mg | INTRAVENOUS | Status: DC | PRN

## 2023-09-27 MED ORDER — SODIUM CHLORIDE 0.9 % IV SOLN: INTRAVENOUS | Status: DC

## 2023-09-27 MED ORDER — LIDOCAINE 2% (20 MG/ML) 5 ML SYRINGE
INTRAMUSCULAR | Status: DC | PRN
  Administered 2023-09-27: 80 mg via INTRAVENOUS

## 2023-09-27 MED ORDER — SODIUM CHLORIDE 0.9% FLUSH: 3.0000 mL | INTRAVENOUS | Status: DC | PRN

## 2023-09-27 MED ORDER — LACTATED RINGERS IV SOLN: INTRAVENOUS | Status: DC

## 2023-09-27 MED ORDER — DOCUSATE SODIUM 100 MG PO CAPS
100.0000 mg | ORAL_CAPSULE | Freq: Every day | ORAL | Status: DC
  Administered 2023-09-28 – 2023-10-12 (×12): 100 mg via ORAL
  Filled 2023-09-27 (×16): qty 1

## 2023-09-27 MED ORDER — PHENOL 1.4 % MT LIQD: 1.0000 | OROMUCOSAL | Status: DC | PRN

## 2023-09-27 MED ORDER — DEXAMETHASONE SODIUM PHOSPHATE 10 MG/ML IJ SOLN
INTRAMUSCULAR | Status: DC | PRN
  Administered 2023-09-27: 2 mg via INTRAVENOUS

## 2023-09-27 MED ORDER — ONDANSETRON HCL 4 MG/2ML IJ SOLN
INTRAMUSCULAR | Status: AC
  Filled 2023-09-27: qty 2

## 2023-09-27 MED ORDER — VANCOMYCIN HCL 1500 MG/300ML IV SOLN
1500.0000 mg | INTRAVENOUS | Status: AC
  Administered 2023-09-28: 1500 mg via INTRAVENOUS
  Filled 2023-09-27 (×2): qty 300

## 2023-09-27 MED ORDER — OXYCODONE HCL 5 MG/5ML PO SOLN: 5.0000 mg | Freq: Once | ORAL | Status: DC | PRN

## 2023-09-27 MED ORDER — SODIUM CHLORIDE 0.9 % IV SOLN: 250.0000 mL | INTRAVENOUS | Status: AC | PRN

## 2023-09-27 MED ORDER — ONDANSETRON HCL 4 MG/2ML IJ SOLN
INTRAMUSCULAR | Status: DC | PRN
  Administered 2023-09-27: 4 mg via INTRAVENOUS

## 2023-09-27 MED ORDER — CHLORHEXIDINE GLUCONATE 0.12 % MT SOLN
OROMUCOSAL | Status: AC
  Administered 2023-09-27: 15 mL via OROMUCOSAL
  Filled 2023-09-27: qty 15

## 2023-09-27 MED ORDER — ONDANSETRON HCL 4 MG/2ML IJ SOLN: 4.0000 mg | Freq: Once | INTRAMUSCULAR | Status: DC | PRN

## 2023-09-27 MED ORDER — OXYCODONE HCL 5 MG PO TABS: 5.0000 mg | ORAL_TABLET | Freq: Once | ORAL | Status: DC | PRN

## 2023-09-27 MED ORDER — MIDAZOLAM HCL 2 MG/2ML IJ SOLN
INTRAMUSCULAR | Status: AC
  Filled 2023-09-27: qty 2

## 2023-09-27 MED ORDER — FENTANYL CITRATE (PF) 100 MCG/2ML IJ SOLN
INTRAMUSCULAR | Status: AC
  Administered 2023-09-27: 50 ug
  Filled 2023-09-27: qty 2

## 2023-09-27 MED ORDER — BUPIVACAINE-EPINEPHRINE (PF) 0.5% -1:200000 IJ SOLN
INTRAMUSCULAR | Status: DC | PRN
  Administered 2023-09-27: 15 mL
  Administered 2023-09-27: 20 mL

## 2023-09-27 MED ORDER — FENTANYL CITRATE (PF) 100 MCG/2ML IJ SOLN: 25.0000 ug | INTRAMUSCULAR | Status: DC | PRN

## 2023-09-27 MED ORDER — OXYCODONE HCL 5 MG PO TABS
5.0000 mg | ORAL_TABLET | ORAL | Status: DC | PRN
  Administered 2023-09-27: 5 mg via ORAL
  Filled 2023-09-27: qty 2
  Filled 2023-09-27: qty 1

## 2023-09-27 MED ORDER — HYDROMORPHONE HCL 1 MG/ML IJ SOLN
0.5000 mg | INTRAMUSCULAR | Status: DC | PRN
  Administered 2023-09-27: 0.5 mg via INTRAVENOUS
  Filled 2023-09-27: qty 1

## 2023-09-27 MED ORDER — ONDANSETRON HCL 4 MG/2ML IJ SOLN: 4.0000 mg | Freq: Four times a day (QID) | INTRAMUSCULAR | Status: DC | PRN

## 2023-09-27 MED ORDER — STERILE WATER FOR IRRIGATION IR SOLN
Status: DC | PRN
  Administered 2023-09-27: 1000 mL

## 2023-09-27 MED ORDER — PHENYLEPHRINE 80 MCG/ML (10ML) SYRINGE FOR IV PUSH (FOR BLOOD PRESSURE SUPPORT)
PREFILLED_SYRINGE | INTRAVENOUS | Status: DC | PRN
  Administered 2023-09-27 (×2): 120 ug via INTRAVENOUS

## 2023-09-27 MED ORDER — LABETALOL HCL 5 MG/ML IV SOLN: 10.0000 mg | INTRAVENOUS | Status: DC | PRN

## 2023-09-27 MED ORDER — ORAL CARE MOUTH RINSE: 15.0000 mL | Freq: Once | OROMUCOSAL | Status: AC

## 2023-09-27 MED ORDER — PROPOFOL 10 MG/ML IV BOLUS
INTRAVENOUS | Status: AC
  Filled 2023-09-27: qty 20

## 2023-09-27 MED ORDER — CEFAZOLIN SODIUM-DEXTROSE 2-4 GM/100ML-% IV SOLN
2.0000 g | INTRAVENOUS | Status: AC
  Administered 2023-09-27: 2 g via INTRAVENOUS
  Filled 2023-09-27: qty 100

## 2023-09-27 MED ORDER — SODIUM CHLORIDE 0.9 % IV SOLN
2.0000 g | Freq: Two times a day (BID) | INTRAVENOUS | Status: AC
  Administered 2023-09-27 – 2023-09-28 (×2): 2 g via INTRAVENOUS
  Filled 2023-09-27 (×2): qty 12.5

## 2023-09-27 MED ORDER — VASHE WOUND IRRIGATION OPTIME
TOPICAL | Status: DC | PRN
  Administered 2023-09-27: 34 [oz_av]

## 2023-09-27 SURGICAL SUPPLY — 46 items
BAG COUNTER SPONGE SURGICOUNT (BAG) ×2 IMPLANT
BLADE SAW RECIP 87.9 MT (BLADE) ×2 IMPLANT
BLADE SURG 21 STRL SS (BLADE) ×2 IMPLANT
BNDG COHESIVE 6X5 TAN ST LF (GAUZE/BANDAGES/DRESSINGS) IMPLANT
BNDG ELASTIC 4X5.8 VLCR STR LF (GAUZE/BANDAGES/DRESSINGS) IMPLANT
BNDG GAUZE DERMACEA FLUFF 4 (GAUZE/BANDAGES/DRESSINGS) IMPLANT
CANISTER SUCTION 3000ML PPV (SUCTIONS) ×2 IMPLANT
CANISTER WOUND CARE 500ML ATS (WOUND CARE) ×2 IMPLANT
COVER SURGICAL LIGHT HANDLE (MISCELLANEOUS) ×2 IMPLANT
CUFF TRNQT CYL 34X4.125X (TOURNIQUET CUFF) ×2 IMPLANT
DRAPE DERMATAC (DRAPES) IMPLANT
DRAPE HALF SHEET 40X57 (DRAPES) IMPLANT
DRAPE INCISE IOBAN 66X45 STRL (DRAPES) ×2 IMPLANT
DRAPE SURG ORHT 6 SPLT 77X108 (DRAPES) IMPLANT
DRAPE U-SHAPE 47X51 STRL (DRAPES) ×2 IMPLANT
DRESSING PREVENA PLUS CUSTOM (GAUZE/BANDAGES/DRESSINGS) ×2 IMPLANT
DRSG VAC GRANUFOAM LG (GAUZE/BANDAGES/DRESSINGS) IMPLANT
DRSG VAC GRANUFOAM MED (GAUZE/BANDAGES/DRESSINGS) IMPLANT
DRSG VAC GRANUFOAM SM (GAUZE/BANDAGES/DRESSINGS) IMPLANT
DURAPREP 26ML APPLICATOR (WOUND CARE) ×2 IMPLANT
ELECTRODE REM PT RTRN 9FT ADLT (ELECTROSURGICAL) ×2 IMPLANT
GAUZE PAD ABD 8X10 STRL (GAUZE/BANDAGES/DRESSINGS) IMPLANT
GAUZE SPONGE 4X4 12PLY STRL (GAUZE/BANDAGES/DRESSINGS) IMPLANT
GAUZE XEROFORM 5X9 LF (GAUZE/BANDAGES/DRESSINGS) ×2 IMPLANT
GLOVE BIOGEL PI IND STRL 9 (GLOVE) ×2 IMPLANT
GLOVE SURG ORTHO 9.0 STRL STRW (GLOVE) ×2 IMPLANT
GOWN STRL REUS W/ TWL LRG LVL3 (GOWN DISPOSABLE) ×2 IMPLANT
GOWN STRL REUS W/ TWL XL LVL3 (GOWN DISPOSABLE) ×4 IMPLANT
GRAFT SKIN WND MICRO 38 (Tissue) IMPLANT
KIT BASIN OR (CUSTOM PROCEDURE TRAY) ×2 IMPLANT
KIT TURNOVER KIT B (KITS) ×2 IMPLANT
MANIFOLD NEPTUNE II (INSTRUMENTS) ×2 IMPLANT
NS IRRIG 1000ML POUR BTL (IV SOLUTION) ×2 IMPLANT
PACK ORTHO EXTREMITY (CUSTOM PROCEDURE TRAY) ×2 IMPLANT
PAD ARMBOARD POSITIONER FOAM (MISCELLANEOUS) ×2 IMPLANT
PREVENA RESTOR ARTHOFORM 46X30 (CANNISTER) ×2 IMPLANT
SPONGE T-LAP 18X18 ~~LOC~~+RFID (SPONGE) IMPLANT
STAPLER SKIN PROX 35W (STAPLE) IMPLANT
STOCKINETTE IMPERVIOUS LG (DRAPES) ×2 IMPLANT
SUT ETHILON 2 0 PSLX (SUTURE) IMPLANT
SUT SILK 2-0 18XBRD TIE 12 (SUTURE) ×2 IMPLANT
SUT VIC AB 1 CTX 27 (SUTURE) ×4 IMPLANT
TOWEL GREEN STERILE (TOWEL DISPOSABLE) ×2 IMPLANT
TOWEL GREEN STERILE FF (TOWEL DISPOSABLE) ×2 IMPLANT
TUBE CONNECTING 12X1/4 (SUCTIONS) ×2 IMPLANT
YANKAUER SUCT BULB TIP NO VENT (SUCTIONS) ×2 IMPLANT

## 2023-09-27 NOTE — Progress Notes (Signed)
 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  Pain started since last 3 weeks and also develop  into open wound.  On presentation, lab work showed WBC count of 11.1, creatinine 1.2, ESR of 106.  X-ray of the foot showed subcutaneous gas concerning for possible osteo.  MRI  showed posterior calcaneal osteomyelitis.  Started on broad-spectrum antibiotics, cultures sent.  Orthopedics planning for left below-knee amputation.  ABI ordered.  Assessment & Plan:  Principal Problem:   Acute osteomyelitis of calcaneum, unspecified laterality (HCC) Active Problems:   Osteomyelitis of left foot (HCC)   Gangrene of left foot (HCC)   Pressure injury of left heel, stage 4 (HCC)  Osteomyelitis of the left calcaneus: Presented with left foot pain, open.  Inflammatory  markers elevated.  X-ray/MRI suggestive of osteomyelitis.  Dr. Harden consulted. Planning for left below-knee amputation.  ABI ordered.  Continue broad-spectrum antibiotics for now  Hypertension: Currently mildly hypertensive.  On amlodipine , Coreg .  Restarted continue as needed medications for severe hypertension.  History of CVA: On aspirin , Plavix  at home. Plavix   on hold for possible surgical intervention.  Will continue aspirin .  He has residual hemiparesis on the left.  He is mostly nonambulatory.  Lives in nursing facility  Hypothyroidism: Continue levothyroxine   CKD stage IIIa: Baseline creatinine from 1.5-1.8.  Currently kidney function at baseline.  Chronic pain syndrome: On Lyrica   HFpEF: Last echo done August 2023 showed EF of 55 to 60%, normal is mostly abnormality, grade 1 diastolic dysfunction.  Currently he is euvolemic.  On  Entresto   Insomnia: On trazodone     Pressure Injury 06/06/23 Buttocks Right Stage 1 -  Intact skin with non-blanchable redness of a localized area usually over a bony prominence. stage 2 (Active)  06/06/23 1600  Location: Buttocks  Location Orientation: Right  Staging: Stage 1 -  Intact skin with non-blanchable redness of a localized area usually over a bony prominence.  Wound Description (Comments): stage 2  DO NOT USE:  Present on Admission: Yes  Dressing Type Foam - Lift dressing to assess site every shift;Other (Comment) 09/26/23 1906    DVT prophylaxis:SCDs Start: 09/25/23 1852     Code Status: Full Code  Family Communication: Discussed with daughter on phone from bedside on 7/8  Patient status:Inpatient  Patient is from: SNF  Anticipated discharge to:SNF  Estimated DC date:1-2 days   Consultants: Orthopedics  Procedures:None yet  Antimicrobials:  Anti-infectives (From admission, onward)    Start     Dose/Rate Route Frequency Ordered Stop   09/27/23 1330  ceFEPIme  (MAXIPIME ) 2 g in sodium chloride  0.9 % 100 mL IVPB        2 g 200 mL/hr over 30 Minutes Intravenous Every 12 hours 09/27/23 0720     09/27/23 1300  ceFAZolin  (ANCEF ) IVPB 2g/100 mL premix        2 g 200 mL/hr over 30 Minutes Intravenous On call to O.R. 09/27/23 0702 09/27/23 0950   09/27/23 1000  vancomycin  (VANCOREADY) IVPB 1500 mg/300 mL        1,500 mg 150 mL/hr over 120 Minutes Intravenous Every 24 hours 09/27/23 0720     09/26/23 1000  vancomycin  (VANCOREADY) IVPB 2000 mg/400 mL  Status:  Discontinued  2,000 mg 200 mL/hr over 120 Minutes Intravenous Every 24 hours 09/26/23 0844 09/27/23 0720   09/26/23 0930  ceFEPIme  (MAXIPIME ) 2 g in sodium chloride  0.9 % 100 mL IVPB  Status:  Discontinued        2 g 200 mL/hr over 30 Minutes Intravenous Every 8 hours 09/26/23 0844 09/27/23 0720   09/26/23 0915  metroNIDAZOLE  (FLAGYL ) IVPB 500 mg        500 mg 100 mL/hr over 60 Minutes Intravenous  Every 12 hours 09/26/23 0829     09/25/23 1500  Ampicillin -Sulbactam (UNASYN ) 3 g in sodium chloride  0.9 % 100 mL IVPB       Placed in And Linked Group   3 g 200 mL/hr over 30 Minutes Intravenous  Once 09/25/23 1445 09/25/23 1610   09/25/23 1500  linezolid  (ZYVOX ) tablet 600 mg       Placed in And Linked Group   600 mg Oral  Once 09/25/23 1445 09/25/23 1535       Subjective: Patient seen and examined at bedside today at short stay.  He was about to go for surgery.  Overall comfortable,  Hemodynamically stable.  No new complaints  Objective: Vitals:   09/27/23 1030 09/27/23 1045 09/27/23 1100 09/27/23 1116  BP: (!) 161/78 (!) 155/71 134/62 (!) 140/78  Pulse: 84 84 85 86  Resp: 18 14 17 18   Temp:   100.3 F (37.9 C) 100.2 F (37.9 C)  TempSrc:    Oral  SpO2: 96% 95% 94% 94%  Weight:      Height:        Intake/Output Summary (Last 24 hours) at 09/27/2023 1143 Last data filed at 09/27/2023 1026 Gross per 24 hour  Intake 1050 ml  Output 25 ml  Net 1025 ml   Filed Weights   09/25/23 2302  Weight: 101.4 kg    Examination:   General exam: Overall comfortable, not in distress, chronically deconditioned HEENT: PERRL Respiratory system:  no wheezes or crackles  Cardiovascular system: S1 & S2 heard, RRR.  Gastrointestinal system: Abdomen is nondistended, soft and nontender. Central nervous system: Alert and oriented, left-sided residual hemiparesis Extremities: Ulcer on the left Skin: No rashes, no ulcers,no icterus     Data Reviewed: I have personally reviewed following labs and imaging studies  CBC: Recent Labs  Lab 09/25/23 1515 09/26/23 0621 09/27/23 0522  WBC 11.1* 9.8 9.6  9.4  NEUTROABS 8.3*  --  7.5  HGB 11.2* 10.7* 10.7*  10.7*  HCT 35.3* 33.0* 33.0*  33.3*  MCV 89.6 87.5 88.7  89.8  PLT 401* 383 307  295   Basic Metabolic Panel: Recent Labs  Lab 09/25/23 1515 09/26/23 0621 09/27/23 0522  NA 136 138 131*  K 4.8 4.6 4.2  CL 104 106 101   CO2 23 20* 19*  GLUCOSE 224* 205* 217*  BUN 37* 35* 44*  CREATININE 1.27* 1.41* 1.87*  CALCIUM  8.9 8.8* 7.9*     Recent Results (from the past 240 hours)  Blood Cultures x 2 sites     Status: None (Preliminary result)   Collection Time: 09/25/23  3:15 PM   Specimen: BLOOD  Result Value Ref Range Status   Specimen Description   Final    BLOOD SITE NOT SPECIFIED Performed at [facility], [address] [place], [place] [address]    Special Requests   Final    BOTTLES DRAWN AEROBIC AND ANAEROBIC Blood Culture adequate volume Performed at [facility], [address] [place], [place] [address]    Culture   Final    NO GROWTH 2 DAYS Performed at [facility], [address], [place], [place] [address]    Report Status PENDING  Incomplete  Blood Cultures x 2 sites     Status: None (Preliminary result)   Collection Time: 09/25/23 11:07 PM   Specimen: BLOOD  Result Value Ref Range Status   Specimen Description BLOOD SITE NOT SPECIFIED  Final   Special Requests   Final    BOTTLES DRAWN AEROBIC AND ANAEROBIC Blood Culture results may not be optimal due to an inadequate volume of blood received in culture bottles   Culture   Final    NO GROWTH 2 DAYS Performed at [facility], [address], [place], [place] [address]    Report Status PENDING  Incomplete  MRSA Next Gen by PCR, Nasal     Status: None   Collection Time: 09/26/23  8:02 AM   Specimen: Nasal Mucosa; Nasal Swab  Result Value Ref Range Status   MRSA by PCR Next Gen NOT DETECTED NOT DETECTED Final    Comment: (NOTE) The GeneXpert MRSA Assay (FDA approved for NASAL specimens only), is one component of a comprehensive MRSA colonization surveillance program. It is not intended to diagnose MRSA infection nor to guide or monitor treatment for MRSA infections. Test performance is not FDA approved in patients less than 48 years old. Performed at [facility],  [address], [place], [place] [address]      Radiology Studies: VAS US  ABI WITH/WO TBI Result Date: 09/26/2023  LOWER EXTREMITY DOPPLER STUDY Patient Name:  TYNELL WINCHELL  Date of Exam:   09/26/2023 Medical Rec #: [id number]          Accession #:    [id number] Date of Birth: 1956-01-30          Patient Gender: M Patient Age:   36 years Exam Location:  [facility] Procedure:      VAS US  ABI WITH/WO TBI Referring Phys: MARCUS DUDA --------------------------------------------------------------------------------  Indications: Peripheral artery disease. High Risk Factors: Diabetes, coronary artery disease, prior CVA.  Comparison Study: No prior Performing Technologist: Jimmye Scarce RVT  Examination Guidelines: A complete evaluation includes at minimum, Doppler waveform signals and systolic blood pressure reading at the level of bilateral brachial, anterior tibial, and posterior tibial arteries, when vessel segments are accessible. Bilateral testing is considered an integral part of a complete examination. Photoelectric Plethysmograph (PPG) waveforms and toe systolic pressure readings are included as required and additional duplex testing as needed. Limited examinations for reoccurring indications may be performed as noted.  ABI Findings: +---------+------------------+-----+---------+--------+ Right    Rt Pressure (mmHg)IndexWaveform Comment  +---------+------------------+-----+---------+--------+ Brachial 140                    triphasic         +---------+------------------+-----+---------+--------+ PTA                             biphasic          +---------+------------------+-----+---------+--------+ DP                              triphasic         +---------+------------------+-----+---------+--------+ Great Toe140               1.00  Normal            +---------+------------------+-----+---------+--------+ +---------+------------------+-----+-----------+---------+ Left      Lt Pressure (mmHg)IndexWaveform   Comment   +---------+------------------+-----+-----------+---------+ Brachial                                   paralyzed +---------+------------------+-----+-----------+---------+ PTA                             multiphasic          +---------+------------------+-----+-----------+---------+ DP                              triphasic            +---------+------------------+-----+-----------+---------+ Great Toe72                0.51 Abnormal             +---------+------------------+-----+-----------+---------+ +-------+-----------+-----------+------------+------------+ ABI/TBIToday's ABIToday's TBIPrevious ABIPrevious TBI +-------+-----------+-----------+------------+------------+ Right  N/C        1.0                                 +-------+-----------+-----------+------------+------------+ Left   N/C        0.51                                +-------+-----------+-----------+------------+------------+  Summary: Right: Resting right ankle-brachial index indicates noncompressible right lower extremity arteries. The right toe-brachial index is normal. Left: Resting left ankle-brachial index indicates noncompressible left lower extremity arteries. The left toe-brachial index is abnormal. *See table(s) above for measurements and observations.     Preliminary    MR FOOT LEFT W WO CONTRAST Result Date: 09/26/2023 CLINICAL DATA:  Left heel pain/wound, diabetes EXAM: MRI OF THE LEFT HINDFOOT WITHOUT AND WITH CONTRAST TECHNIQUE: Multiplanar, multisequence MR imaging of the left hindfoot was performed both before and after administration of intravenous contrast. CONTRAST:  10mL GADAVIST  GADOBUTROL  1 MMOL/ML IV SOLN COMPARISON:  Radiographs 09/25/2023 FINDINGS: Bones/Joint/Cartilage Cortical erosion and marrow edema posteriorly along the calcaneus compatible with posterior calcaneal osteomyelitis. Associated marrow enhancement noted.  Ligaments Thickened anterior talofibular ligament, query remote injury. Thickened superomedial portion of the spring ligament. Muscles and Tendons Mild distal tibialis posterior tendinopathy. Moderate distal Achilles tendinopathy. Thickened proximal plantar fascia, especially the medial band, favoring plantar fasciitis. Regional muscular atrophy. Soft tissues Ulceration posterior to the calcaneus in the vicinity of the Achilles attachment site. Diffuse subcutaneous edema along the ankle especially notable medially and laterally, and tracking into the dorsum of the foot. IMPRESSION: 1. Posterior calcaneal osteomyelitis. 2. Ulceration posterior to the calcaneus in the vicinity of the Achilles attachment site. 3. Moderate distal Achilles tendinopathy. 4. Thickened proximal plantar fascia, especially the medial band, favoring plantar fasciitis. 5. Mild distal tibialis posterior tendinopathy. 6. Thickened anterior talofibular ligament, query remote injury. 7. Diffuse subcutaneous edema along the ankle especially notable medially and laterally, and tracking into the dorsum of the foot. Electronically Signed   By: Ryan Salvage M.D.   On: 09/26/2023 08:06   DG Foot Complete Left Result Date: 09/25/2023 CLINICAL DATA:  L foot wound EXAM: LEFT FOOT - COMPLETE 3+ VIEW COMPARISON:  None Available. FINDINGS: Diffuse osteopenia.No acute fracture or dislocation. There is  no evidence of arthropathy or other focal bone abnormality. Severe diffuse peripheral vascular atherosclerosis. Subcutaneous gas overlying the posterior calcaneal margin, likely due to underlying patient's wound. Cortical irregularity is also present in the underlying calcaneus, worrisome for osteomyelitis. IMPRESSION: Subcutaneous gas overlying the posterior calcaneal margin, likely representing patient's known wound. Cortical irregularity is also present in the underlying calcaneus, worrisome for osteomyelitis. A follow-up MRI of the foot is recommended  for confirmation Electronically Signed   By: Rogelia Myers M.D.   On: 09/25/2023 14:30    Scheduled Meds:  amLODipine   10 mg Oral q morning   aspirin  EC  81 mg Oral Daily   atorvastatin   40 mg Oral QPM   baclofen   10 mg Oral BID   carvedilol   6.25 mg Oral BID WC   DULoxetine   60 mg Oral Daily   fentaNYL  (SUBLIMAZE ) injection  50 mcg Intravenous Once   insulin  aspart  0-24 Units Subcutaneous TID WC   leptospermum manuka honey  1 Application Topical Daily   levothyroxine   75 mcg Oral QHS   midazolam        pneumococcal 20-valent conjugate vaccine  0.5 mL Intramuscular Tomorrow-1000   pregabalin   75 mg Oral BID   sacubitril -valsartan   1 tablet Oral BID   Continuous Infusions:  sodium chloride  75 mL/hr at 09/27/23 1119   ceFEPime  (MAXIPIME ) IV     metronidazole  500 mg (09/26/23 2114)   vancomycin        LOS: 2 days   Ivonne Mustache, MD [facility] P7/11/2023, 11:43 AM

## 2023-09-27 NOTE — Anesthesia Procedure Notes (Signed)
 Anesthesia Regional Block: Adductor canal block   Pre-Anesthetic Checklist: , timeout performed,  Correct Patient, Correct Site, Correct Laterality,  Correct Procedure, Correct Position, site marked,  Risks and benefits discussed,  Surgical consent,  Pre-op evaluation,  At surgeon's request and post-op pain management  Laterality: Left  Prep: chloraprep       Needles:  Injection technique: Single-shot  Needle Type: Echogenic Stimulator Needle     Needle Length: 5cm  Needle Gauge: 22     Additional Needles:   Procedures:,,,, ultrasound used (permanent image in chart),,    Narrative:  Start time: 09/27/2023 9:10 AM End time: 09/27/2023 9:15 AM Injection made incrementally with aspirations every 5 mL.  Performed by: Personally  Anesthesiologist: Mallory Manus, MD  Additional Notes: Functioning IV was confirmed and monitors were applied.  A 50mm 22ga Arrow echogenic stimulator needle was used. Sterile prep and drape,hand hygiene and sterile gloves were used. Ultrasound guidance: relevant anatomy identified, needle position confirmed, local anesthetic spread visualized around nerve(s)., vascular puncture avoided.  Image printed for medical record. Negative aspiration and negative test dose prior to incremental administration of local anesthetic. The patient tolerated the procedure well.

## 2023-09-27 NOTE — Progress Notes (Signed)
 Orthopedic Tech Progress Note Patient Details:  Steve Alvarado Oct 23, 1955 [id number]  Patient has AMPUSHIELD   Patient ID: Steve Alvarado Paschal, male   DOB: July 02, 1955, 68 y.o.   MRN: [id number]  [name] Pac 09/27/2023, 10:38 AM

## 2023-09-27 NOTE — Anesthesia Procedure Notes (Signed)
 Procedure Name: LMA Insertion Date/Time: 09/27/2023 9:43 AM  Performed by: Roslynn Waddell LABOR, CRNAPre-anesthesia Checklist: Patient identified, Emergency Drugs available, Suction available and Patient being monitored Patient Re-evaluated:Patient Re-evaluated prior to induction Oxygen Delivery Method: Circle System Utilized Preoxygenation: Pre-oxygenation with 100% oxygen Induction Type: IV induction LMA: LMA with gastric port inserted LMA Size: 4.0 Number of attempts: 1 Airway Equipment and Method: Bite block Placement Confirmation: positive ETCO2 Tube secured with: Tape Dental Injury: Teeth and Oropharynx as per pre-operative assessment  Comments: Atraumatic induction/LMA insertion. Patient edentulous. Oral mucosa as per preop.

## 2023-09-27 NOTE — Progress Notes (Addendum)
 [facility] 202 in short stay - per Dr. Mallory, no periop glycemic control protocol to be started.

## 2023-09-27 NOTE — Progress Notes (Signed)
 Pharmacy Antibiotic Note  Steve Alvarado is a 68 y.o. male admitted on 09/25/2023 with heel wound x 3 weeks, found to have osteomyelitis and gangrenous ulceration.  Pharmacy has been consulted for vancomycin  and cefepime  dosing.  Patient received antibiotics in the ED.  Renal function trending up (1.41 >> 1.87), afebrile, WBC WNL Noted plan for BKA  Plan: Change Vanc 1.5gm IV Q24H for AUC 509 using SCr 1.87 Change Cefepime  2gm IV Q12H Flagyl  500mg  IV Q12H Monitor renal fxn, clinical progress, vanc levels if indicated Follow up antibiotic plan s/p ortho surgery 7/9 ~1400   Height: 6' 2 (188 cm) Weight: 101.4 kg (223 lb 8.7 oz) IBW/kg (Calculated) : 82.2  Temp (24hrs), Avg:98.3 F (36.8 C), Min:97.9 F (36.6 C), Max:98.9 F (37.2 C)  Recent Labs  Lab 09/25/23 1515 09/26/23 0621 09/27/23 0522  WBC 11.1* 9.8 9.4  CREATININE 1.27* 1.41* 1.87*    Estimated Creatinine Clearance: 47.4 mL/min (A) (by C-G formula based on SCr of 1.87 mg/dL (H)).    Allergies  Allergen Reactions   Ace Inhibitors Cough    Reaction not listed on MAR    Vanc 7/8 >> Cefepime  7/8 >>  Flagyl  7/8 >> Unasyn /Zyvox  x1 7/7   7/7 BCx - ngtd<12H 7/8 MRSA PCR neg   Milliana Reddoch BS, PharmD, BCPS Clinical Pharmacist 09/27/2023 7:22 AM  Contact: [phone] after 3 PM  Be curious, not judgmental... -Davina Sprinkles

## 2023-09-27 NOTE — Evaluation (Signed)
 Occupational Therapy Evaluation Patient Details Name: Steve Alvarado MRN: [id number] DOB: [birth year] Today's Date: 09/27/2023   History of Present Illness   Steve Alvarado is a 68 yo male who presented from SNF with L heel pain. 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 He is from SNF as receives assist for all ADLs and SP transfers from bed>WC at baseline. Pt had a difficult time recalling prior functional level and if he was LTC or rehab at [facility]. Per chart review 07/2022 he was living with family but dependent for ADLs at bed level and had heavy assist for transfers, SNF was recommended after that admission. Upon evaluation the pt was limited by impaired cognition, LLE pain, chronic L hemiparesis and poor tolerance for activity. Overall he required total A to attempt rolling at bed level and declined EOB attempt due to increased pain. Due to the deficits listed below the pt also needs up to mod A for UB ADLs at bed level and total A for LB ADLs. Pt will benefit from continued acute OT services and skilled inpatient follow up therapy, <3 hours/day - of not, pt reported that he wants to discharge to his daughters house. He said she works and has stairs to enter her house.       If plan is discharge home, recommend the following:   A lot of help with walking and/or transfers;Two people to help with walking and/or transfers;A lot of help with bathing/dressing/bathroom;Two people to help with bathing/dressing/bathroom;Assistance with cooking/housework;Assistance with feeding;Direct supervision/assist for medications management;Direct supervision/assist for financial management;Help with stairs or ramp for entrance;Assist for transportation;Supervision due to cognitive  status     Functional Status Assessment   Patient has had a recent decline in their functional status and demonstrates the ability to make significant improvements in function in a reasonable and predictable amount of time.     Equipment Recommendations   Other (comment) (defer to SNF)      Precautions/Restrictions   Precautions Precautions: Fall Recall of Precautions/Restrictions: Impaired Required Braces or Orthoses: Splint/Cast;Other Brace Splint/Cast: L resting hand splint donned on arrival Other Brace: L limb protector Restrictions Weight Bearing Restrictions Per Provider Order: Yes LLE Weight Bearing Per Provider Order: Non weight bearing     Mobility Bed Mobility Overal bed mobility: Needs Assistance Bed Mobility: Rolling Rolling: Total assist         General bed mobility comments: very minimal initiation or assist    Transfers              ADL either performed or assessed with clinical judgement   ADL Overall ADL's : Needs assistance/impaired Eating/Feeding: Set up;Bed level   Grooming: Set up;Bed level   Upper Body Bathing: Moderate assistance;Bed level   Lower Body Bathing: Total assistance;+2 for physical assistance;+2 for safety/equipment;Bed level   Upper Body Dressing : Moderate assistance;Bed level   Lower Body Dressing: Total assistance;+2 for safety/equipment;+2 for physical assistance;Bed level   Toilet Transfer: Total assistance;+2 for physical assistance;+2 for safety/equipment   Toileting- Clothing Manipulation and Hygiene: Total assistance;+2 for physical assistance;+2 for safety/equipment       Functional mobility during ADLs: Total assistance;+2 for physical assistance;+2 for safety/equipment General ADL Comments: limited by new L BKA and L hemiparesis, impiared cog, pain     Vision Baseline Vision/History: 0  No visual deficits Vision Assessment?: No apparent visual deficits     Perception Perception: Not tested        Praxis Praxis: Not tested       Pertinent Vitals/Pain Pain Assessment Pain Assessment: Faces Faces Pain Scale: Hurts whole lot Pain Location: LLE Pain Descriptors / Indicators: Discomfort, Grimacing, Guarding Pain Intervention(s): Limited activity within patient's tolerance, Monitored during session     Extremity/Trunk Assessment Upper Extremity Assessment Upper Extremity Assessment: RUE deficits/detail;LUE deficits/detail RUE Deficits / Details: globally weak but functional RUE Sensation: WNL RUE Coordination: decreased gross motor LUE Deficits / Details: pt stating im paralyzed. he was in a resting hand splint on arrival. Painful and limited PROM at digits and wrist. no activation noted at elbow.   Lower Extremity Assessment Lower Extremity Assessment: Defer to PT evaluation       Communication Communication Communication: Impaired Factors Affecting Communication: Difficulty expressing self;Reduced clarity of speech   Cognition Arousal: Lethargic Behavior During Therapy: Flat affect Cognition: No family/caregiver present to determine baseline             OT - Cognition Comments: Pt with minimal verbalizations, unable to recall PLOF. required simple 1 step cues wtih repetition and increased time                 Following commands: Impaired Following commands impaired: Follows one step commands inconsistently     Cueing  General Comments   Cueing Techniques: Verbal cues;Gestural cues  VSS           Home Living Family/patient expects to be discharged to:: Skilled nursing facility             Additional Comments: Pt presented from SNF. Unable to recall if he is LTC or rehab. Pt reports he is planning to go to daughters house at discharge, he states she does have steps to enter and she might need to get a ramp.      Prior Functioning/Environment Prior Level of Function : Needs assist             Mobility Comments: Pt states  staff assists him with SP transfers ADLs Comments: pt reports staff assists with ADLs at bed level    OT Problem List: Decreased range of motion;Decreased strength;Impaired balance (sitting and/or standing);Decreased activity tolerance;Decreased cognition;Decreased safety awareness;Decreased knowledge of use of DME or AE;Decreased knowledge of precautions;Pain   OT Treatment/Interventions: Self-care/ADL training;Therapeutic exercise;DME and/or AE instruction;Therapeutic activities;Patient/family education      OT Goals(Current goals can be found in the care plan section)   Acute Rehab OT Goals Patient Stated Goal: to go to daughters house OT Goal Formulation: With patient Time For Goal Achievement: 10/11/23 Potential to Achieve Goals: Good ADL Goals Pt Will Perform Upper Body Dressing: with set-up;sitting Pt Will Perform Lower Body Dressing: with max assist;sitting/lateral leans Additional ADL Goal #1: Pt will complete bed mobility with min A as a precursor to ADLs   OT Frequency:  Min 2X/week    Co-evaluation              AM-PAC OT 6 Clicks Daily Activity     Outcome Measure Help from another person eating meals?: A Little Help from another person taking care of personal grooming?: A Little Help from another person toileting, which includes using toliet, bedpan, or urinal?: Total Help from another person bathing (including washing, rinsing, drying)?: A Lot Help from another person to put on and taking off regular upper body clothing?: A Lot Help from another  person to put on and taking off regular lower body clothing?: Total 6 Click Score: 12   End of Session Nurse Communication: Mobility status  Activity Tolerance: Patient limited by pain Patient left: in bed;with call bell/phone within reach;with bed alarm set  OT Visit Diagnosis: Unsteadiness on feet (R26.81);Other abnormalities of gait and mobility (R26.89);Muscle weakness (generalized) (M62.81);Pain                 Time: 1440-1455 OT Time Calculation (min): 15 min Charges:  OT General Charges $OT Visit: 1 Visit OT Evaluation $OT Eval Moderate Complexity: 1 Mod  Lucie Kendall, OTR/L Acute Rehabilitation Services Office [phone] Secure Chat Communication Preferred   Lucie JONETTA Kendall 09/27/2023, 3:11 PM

## 2023-09-27 NOTE — Anesthesia Preprocedure Evaluation (Signed)
 Anesthesia Evaluation  Patient identified by MRN, date of birth, ID band Patient awake    Reviewed: Allergy & Precautions, H&P , NPO status , Patient's Chart, lab work & pertinent test results  Airway Mallampati: II  TM Distance: >3 FB Neck ROM: Full    Dental no notable dental hx.    Pulmonary neg pulmonary ROS   Pulmonary exam normal breath sounds clear to auscultation       Cardiovascular hypertension, Pt. on medications + CAD, + Past MI and +CHF  Normal cardiovascular exam Rhythm:Regular Rate:Normal     Neuro/Psych  PSYCHIATRIC DISORDERS  Depression     Neuromuscular disease CVA    GI/Hepatic Neg liver ROS,GERD  ,,  Endo/Other  diabetesHypothyroidism    Renal/GU Renal disease  negative genitourinary   Musculoskeletal negative musculoskeletal ROS (+)    Abdominal   Peds negative pediatric ROS (+)  Hematology  (+) Blood dyscrasia, anemia   Anesthesia Other Findings   Reproductive/Obstetrics negative OB ROS                              Anesthesia Physical Anesthesia Plan  ASA: 4  Anesthesia Plan: Regional and MAC   Post-op Pain Management: Minimal or no pain anticipated and Regional block*   Induction: Intravenous  PONV Risk Score and Plan: 2 and Ondansetron , Treatment may vary due to age or medical condition and Propofol  infusion  Airway Management Planned:   Additional Equipment: None  Intra-op Plan:   Post-operative Plan: Extubation in OR  Informed Consent: I have reviewed the patients History and Physical, chart, labs and discussed the procedure including the risks, benefits and alternatives for the proposed anesthesia with the patient or authorized representative who has indicated his/her understanding and acceptance.     Dental advisory given  Plan Discussed with: CRNA and Anesthesiologist  Anesthesia Plan Comments:          Anesthesia Quick  Evaluation

## 2023-09-27 NOTE — Op Note (Signed)
 09/27/2023  10:18 AM  PATIENT:  Steve Alvarado    PRE-OPERATIVE DIAGNOSIS:  osteomyelitis, gangrene left foot  POST-OPERATIVE DIAGNOSIS:  Same  PROCEDURE:  AMPUTATION BELOW KNEE, LEFT, APPLICATION, WOUND VAC Application of Kerecis micro graft 38 cm. Application of Prevena customizable and Prevena arthroform wound VAC dressings Application of Vive Wear stump shrinker and the [employer] limb protector  SURGEON:  Jerona LULLA Sage, MD  ANESTHESIA:   General  PREOPERATIVE INDICATIONS:  [name] is a  68 y.o. male with a diagnosis of osteomyelitis, gangrene left foot who failed conservative measures and elected for surgical management.    The risks benefits and alternatives were discussed with the patient preoperatively including but not limited to the risks of infection, bleeding, nerve injury, cardiopulmonary complications, the need for revision surgery, among others, and the patient was willing to proceed.  OPERATIVE IMPLANTS:   Implant Name Type Inv. Item Serial No. Manufacturer Lot No. LRB No. Used Action  GRAFT SKIN WND MICRO 38 - [id number] Tissue GRAFT SKIN WND MICRO 38  [employer] [id number] Left 1 Implanted     OPERATIVE FINDINGS: Tissue margins were clear.  Muscle was gray with poor contractility.  OPERATIVE PROCEDURE: Patient was brought to the operating room after undergoing a regional anesthetic.  After adequate levels anesthesia were obtained a thigh tourniquet was placed and the lower extremity was prepped using DuraPrep draped into a sterile field. The foot was draped out of the sterile field with impervious stockinette.  A timeout was called and the tourniquet inflated.  A transverse skin incision was made 12 cm distal to the tibial tubercle, the incision curved proximally, and a large posterior flap was created.  The tibia was transected just proximal to the skin incision and beveled anteriorly.  The fibula was transected just proximal to the tibial incision.  The  sciatic nerve was pulled cut and allowed to retract.  The vascular bundles were suture ligated with 2-0 silk.  The tourniquet was deflated and hemostasis obtained.  The wound was irrigated with Vashe.   The Kerecis micro powder 38 cm was applied to the open wound that has a 200 cm surface area.    The deep and superficial fascial layers were closed using #1 Vicryl.  The skin was closed using staples.    The Prevena customizable dressing was applied this was overwrapped with the arthroform sponge.  Parthenia was used to secure the sponges and the circumferential compression was secured to the skin with Dermatac.  This was connected to the wound VAC pump and had a good suction fit this was covered with a stump shrinker and a limb protector.  Patient was taken to the PACU in stable condition.   DISCHARGE PLANNING:  Antibiotic duration: 24-hour antibiotics  Weightbearing: Nonweightbearing on the operative extremity  Pain medication: Opioid pathway  Dressing care/ Wound VAC: Continue wound VAC with the Prevena plus pump at discharge for 1 week  Ambulatory devices: Walker or kneeling scooter  Discharge to: Discharge planning based on recommendations per physical therapy  Follow-up: In the office 1 week after discharge.

## 2023-09-27 NOTE — Transfer of Care (Signed)
 Immediate Anesthesia Transfer of Care Note  Patient: Steve Alvarado  Procedure(s) Performed: AMPUTATION BELOW KNEE, LEFT (Left: Knee) APPLICATION, WOUND VAC (Left)  Patient Location: PACU  Anesthesia Type:General  Level of Consciousness: awake  Airway & Oxygen Therapy: Patient Spontanous Breathing and Patient connected to face mask oxygen  Post-op Assessment: Report given to RN and Post -op Vital signs reviewed and stable  Post vital signs: Reviewed and stable  Last Vitals:  Vitals Value Taken Time  BP 172/82 09/27/23 10:22  Temp    Pulse 82 09/27/23 10:24  Resp 15 09/27/23 10:23  SpO2 100 % 09/27/23 10:24  Vitals shown include unfiled device data.  Last Pain:  Vitals:   09/27/23 0838  TempSrc: Oral  PainSc:          Complications: No notable events documented.

## 2023-09-27 NOTE — Interval H&P Note (Signed)
 History and Physical Interval Note:  09/27/2023 8:14 AM  Steve Alvarado  has presented today for surgery, with the diagnosis of osteomyelitis, gangrene left foot.  The various methods of treatment have been discussed with the patient and family. After consideration of risks, benefits and other options for treatment, the patient has consented to  Procedure(s): AMPUTATION BELOW KNEE (Left) APPLICATION, WOUND VAC (Left) as a surgical intervention.  The patient's history has been reviewed, patient examined, no change in status, stable for surgery.  I have reviewed the patient's chart and labs.  Questions were answered to the patient's satisfaction.     [name] V Eadie Repetto

## 2023-09-27 NOTE — Progress Notes (Signed)
   Inpatient Rehabilitation Admissions Coordinator   Rehab consult received postop. Noted patient from [facility] and likely long term resident. TOC notified. We will sign off.  Heron Leavell, RN, MSN Rehab Admissions Coordinator [phone] 09/27/2023 1:05 PM

## 2023-09-27 NOTE — Interval H&P Note (Signed)
 History and Physical Interval Note:  09/27/2023 6:48 AM  Steve Alvarado  has presented today for surgery, with the diagnosis of osteomyelitis, gangrene left foot.  The various methods of treatment have been discussed with the patient and family. After consideration of risks, benefits and other options for treatment, the patient has consented to  Procedure(s): AMPUTATION BELOW KNEE (Left) APPLICATION, WOUND VAC (Left) as a surgical intervention.  The patient's history has been reviewed, patient examined, no change in status, stable for surgery.  I have reviewed the patient's chart and labs.  Questions were answered to the patient's satisfaction.     Lanyia Jewel V Rajiv Parlato

## 2023-09-27 NOTE — Anesthesia Procedure Notes (Signed)
 Anesthesia Regional Block: Popliteal block   Pre-Anesthetic Checklist: , timeout performed,  Correct Patient, Correct Site, Correct Laterality,  Correct Procedure, Correct Position, site marked,  Risks and benefits discussed,  Surgical consent,  Pre-op evaluation,  At surgeon's request and post-op pain management  Laterality: Left  Prep: chloraprep       Needles:  Injection technique: Single-shot  Needle Type: Echogenic Stimulator Needle     Needle Length: 5cm  Needle Gauge: 22     Additional Needles:   Procedures:, nerve stimulator,,, ultrasound used (permanent image in chart),,     Nerve Stimulator or Paresthesia:  Response: FOOT, 0.45 mA  Additional Responses:   Narrative:  Start time: 09/27/2023 9:10 AM End time: 09/27/2023 9:15 AM Injection made incrementally with aspirations every 5 mL.  Performed by: Personally  Anesthesiologist: Mallory Manus, MD  Additional Notes: Functioning IV was confirmed and monitors were applied.  A 50mm 22ga Arrow echogenic stimulator needle was used. Sterile prep and drape,hand hygiene and sterile gloves were used. Ultrasound guidance: relevant anatomy identified, needle position confirmed, local anesthetic spread visualized around nerve(s)., vascular puncture avoided.  Image printed for medical record. Negative aspiration and negative test dose prior to incremental administration of local anesthetic. The patient tolerated the procedure well.

## 2023-09-27 NOTE — Anesthesia Postprocedure Evaluation (Signed)
 Anesthesia Post Note  Patient: Steve Alvarado  Procedure(s) Performed: AMPUTATION BELOW KNEE, LEFT (Left: Knee) APPLICATION, WOUND VAC (Left)     Patient location during evaluation: PACU Anesthesia Type: Regional and General Level of consciousness: awake and alert Pain management: pain level controlled Vital Signs Assessment: post-procedure vital signs reviewed and stable Respiratory status: spontaneous breathing, nonlabored ventilation, respiratory function stable and patient connected to nasal cannula oxygen Cardiovascular status: stable and blood pressure returned to baseline Postop Assessment: no apparent nausea or vomiting Anesthetic complications: no   No notable events documented.  Last Vitals:  Vitals:   09/27/23 1100 09/27/23 1116  BP: 134/62 (!) 140/78  Pulse: 85 86  Resp: 17 18  Temp: 37.9 C 37.9 C  SpO2: 94% 94%    Last Pain:  Vitals:   09/27/23 1116  TempSrc: Oral  PainSc:                  Birtha Hatler

## 2023-09-28 ENCOUNTER — Encounter (HOSPITAL_COMMUNITY): Payer: Self-pay | Admitting: Orthopedic Surgery

## 2023-09-28 DIAGNOSIS — M86179 Other acute osteomyelitis, unspecified ankle and foot: Secondary | ICD-10-CM | POA: Diagnosis not present

## 2023-09-28 LAB — GLUCOSE, CAPILLARY
Glucose-Capillary: 180 mg/dL — ABNORMAL HIGH (ref 70–99)
Glucose-Capillary: 186 mg/dL — ABNORMAL HIGH (ref 70–99)
Glucose-Capillary: 177 mg/dL — ABNORMAL HIGH (ref 70–99)
Glucose-Capillary: 199 mg/dL — ABNORMAL HIGH (ref 70–99)
Glucose-Capillary: 181 mg/dL — ABNORMAL HIGH (ref 70–99)

## 2023-09-28 LAB — BASIC METABOLIC PANEL WITH GFR
Anion gap: 11 (ref 5–15)
BUN: 49 mg/dL — ABNORMAL HIGH (ref 8–23)
CO2: 18 mmol/L — ABNORMAL LOW (ref 22–32)
Calcium: 8.4 mg/dL — ABNORMAL LOW (ref 8.9–10.3)
Chloride: 107 mmol/L (ref 98–111)
Creatinine, Ser: 2.29 mg/dL — ABNORMAL HIGH (ref 0.61–1.24)
GFR, Estimated: 30 mL/min — ABNORMAL LOW (ref >60)
Glucose, Bld: 190 mg/dL — ABNORMAL HIGH (ref 70–99)
Potassium: 4.6 mmol/L (ref 3.5–5.1)
Sodium: 136 mmol/L (ref 135–145)

## 2023-09-28 MED ORDER — CLOPIDOGREL BISULFATE 75 MG PO TABS
75.0000 mg | ORAL_TABLET | Freq: Every day | ORAL | Status: DC
  Administered 2023-09-28 – 2023-10-12 (×14): 75 mg via ORAL
  Filled 2023-09-28 (×16): qty 1

## 2023-09-28 MED ORDER — HEPARIN SODIUM (PORCINE) 5000 UNIT/ML IJ SOLN
5000.0000 [IU] | Freq: Three times a day (TID) | INTRAMUSCULAR | Status: DC
  Administered 2023-09-28 – 2023-10-11 (×38): 5000 [IU] via SUBCUTANEOUS
  Filled 2023-09-28 (×41): qty 1

## 2023-09-28 MED ORDER — SODIUM CHLORIDE 0.9 % IV SOLN: INTRAVENOUS | Status: DC

## 2023-09-28 NOTE — Progress Notes (Signed)
 Patient ID: Steve Alvarado, male   DOB: 21-Apr-1955, 68 y.o.   MRN: [id number] Patient is postoperative day 1 below-knee amputation left.  There is no drainage in the wound VAC canister there is a good suction fit.  Patient has alert and comfortable.  Patient may benefit from inpatient rehab.  Patient states he will eventually discharge to his daughter's home.

## 2023-09-28 NOTE — Care Management Important Message (Signed)
 Important Message  Patient Details  Name: Steve Alvarado MRN: [id number] Date of Birth: [birth year]   Important Message Given:  Yes - Medicare IM     Jon Cruel 09/28/2023, 3:42 PM

## 2023-09-28 NOTE — Progress Notes (Addendum)
 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  Pain started since last 3 weeks and also develop  into open wound.  On presentation, lab work showed WBC count of 11.1, creatinine 1.2, ESR of 106.  X-ray of the foot showed subcutaneous gas concerning for possible osteo.  MRI  showed posterior calcaneal osteomyelitis.  Started on broad-spectrum antibiotics, cultures sent.  S/p  left below-knee amputation.  PT/OT evaluation pending.  Patient interested in inpatient rehab  Assessment & Plan:  Principal Problem:   Acute osteomyelitis of calcaneum, unspecified laterality (HCC) Active Problems:   Osteomyelitis of left foot (HCC)   Gangrene of left foot (HCC)   Pressure injury of left heel, stage 4 (HCC)  Osteomyelitis of the left calcaneus: Presented with left foot pain, open.  Inflammatory  markers elevated.  X-ray/MRI suggestive of osteomyelitis.  Dr. Harden consulted.S/P left below-knee amputation.  Antibiotics will be stopped after today's dose  Hypertension:  On amlodipine , Coreg .  Restarted continue as needed medications for severe hypertension.  History of CVA: On aspirin , Plavix  at home.  He has residual hemiparesis on the left.  He is mostly nonambulatory.  Lives in nursing facility  Hypothyroidism: Continue levothyroxine   AKI on CKD stage IIIa: Baseline creatinine from 1.5-1.8.  Creatinine trended up to the range of 2 today.  Started on gentle IV fluids.  Check BMP tomorrow  Chronic pain syndrome: On Lyrica   HFpEF: Last echo done August 2023 showed EF of 55 to 60%, normal is mostly abnormality, grade 1 diastolic dysfunction.  Currently he is euvolemic.  On Entresto   Insomnia: On  trazodone     Pressure Injury 06/06/23 Buttocks Right Stage 1 -  Intact skin with non-blanchable redness of a localized area usually over a bony prominence. stage 2 (Active)  06/06/23 1600  Location: Buttocks  Location Orientation: Right  Staging: Stage 1 -  Intact skin with non-blanchable redness of a localized area usually over a bony prominence.  Wound Description (Comments): stage 2  DO NOT USE:  Present on Admission: Yes  Dressing Type Foam - Lift dressing to assess site every shift 09/27/23 2116    DVT prophylaxis:SCD's Start: 09/27/23 1237 SCDs Start: 09/25/23 1852     Code Status: Full Code  Family Communication: Discussed with wife on phone from bedside on 7/10  Patient status:Inpatient  Patient is from: SNF  Anticipated discharge to: Acute inpatient rehab  Estimated DC date:1-2 days   Consultants: Orthopedics  Procedures: BKA Antimicrobials:  Anti-infectives (From admission, onward)    Start     Dose/Rate Route Frequency Ordered Stop   09/27/23 1330  ceFEPIme  (MAXIPIME ) 2 g in sodium chloride  0.9 % 100 mL IVPB        2 g 200 mL/hr over 30 Minutes Intravenous Every 12 hours 09/27/23 0720 09/28/23 0719   09/27/23 1300  ceFAZolin  (ANCEF ) IVPB 2g/100 mL premix        2 g 200 mL/hr over 30 Minutes Intravenous On call to O.R. 09/27/23 0702 09/27/23 0950   09/27/23 1000  vancomycin  (VANCOREADY) IVPB 1500 mg/300 mL        1,500 mg 150 mL/hr over 120 Minutes Intravenous Every 24 hours 09/27/23 0720 09/28/23 1000   09/26/23 1000  vancomycin  (VANCOREADY) IVPB 2000 mg/400  mL  Status:  Discontinued        2,000 mg 200 mL/hr over 120 Minutes Intravenous Every 24 hours 09/26/23 0844 09/27/23 0720   09/26/23 0930  ceFEPIme  (MAXIPIME ) 2 g in sodium chloride  0.9 % 100 mL IVPB  Status:  Discontinued        2 g 200 mL/hr over 30 Minutes Intravenous Every 8 hours 09/26/23 0844 09/27/23 0720   09/26/23 0915  metroNIDAZOLE  (FLAGYL ) IVPB 500 mg        500 mg 100 mL/hr over 60  Minutes Intravenous Every 12 hours 09/26/23 0829 09/28/23 0918   09/25/23 1500  Ampicillin -Sulbactam (UNASYN ) 3 g in sodium chloride  0.9 % 100 mL IVPB       Placed in And Linked Group   3 g 200 mL/hr over 30 Minutes Intravenous  Once 09/25/23 1445 09/25/23 1610   09/25/23 1500  linezolid  (ZYVOX ) tablet 600 mg       Placed in And Linked Group   600 mg Oral  Once 09/25/23 1445 09/25/23 1535       Subjective: Patient seen and examined at the bedside today.  Very comfortable.  Trying to eat his breakfast.  Denies any pain on the operated limb.  Patient says he does not want to go back to his previous facility and wants to go to inpatient rehab.  Looks very comfortable today  Objective: Vitals:   09/27/23 1532 09/27/23 1935 09/28/23 0453 09/28/23 0800  BP: (!) 143/69 133/63 (!) 151/77 (!) 148/72  Pulse: 77 70 68 69  Resp: 17 15 15 16   Temp: 98.9 F (37.2 C) 98.5 F (36.9 C) 98.2 F (36.8 C) 98.5 F (36.9 C)  TempSrc: Axillary Oral Oral Oral  SpO2: 90% 100% 98% 100%  Weight:      Height:        Intake/Output Summary (Last 24 hours) at 09/28/2023 1100 Last data filed at 09/28/2023 0815 Gross per 24 hour  Intake 441.44 ml  Output 0 ml  Net 441.44 ml   Filed Weights   09/25/23 2302  Weight: 101.4 kg    Examination:   General exam: Overall comfortable, not in distress,obese HEENT: PERRL Respiratory system:  no wheezes or crackles  Cardiovascular system: S1 & S2 heard, RRR.  Gastrointestinal system: Abdomen is nondistended, soft and nontender. Central nervous system: Alert and oriented, left-sided residual hemiparesis Extremities: Left BKA, wound VAC Skin: No rashes, no ulcers,no icterus       Data Reviewed: I have personally reviewed following labs and imaging studies  CBC: Recent Labs  Lab 09/25/23 1515 09/26/23 0621 09/27/23 0522  WBC 11.1* 9.8 9.6  9.4  NEUTROABS 8.3*  --  7.5  HGB 11.2* 10.7* 10.7*  10.7*  HCT 35.3* 33.0* 33.0*  33.3*  MCV 89.6 87.5  88.7  89.8  PLT 401* 383 307  295   Basic Metabolic Panel: Recent Labs  Lab 09/25/23 1515 09/26/23 0621 09/27/23 0522 09/28/23 0733  NA 136 138 131* 136  K 4.8 4.6 4.2 4.6  CL 104 106 101 107  CO2 23 20* 19* 18*  GLUCOSE 224* 205* 217* 190*  BUN 37* 35* 44* 49*  CREATININE 1.27* 1.41* 1.87* 2.29*  CALCIUM  8.9 8.8* 7.9* 8.4*     Recent Results (from the past 240 hours)  Blood Cultures x 2 sites     Status: None (Preliminary result)   Collection Time: 09/25/23  3:15 PM   Specimen: BLOOD  Result Value Ref Range Status   Specimen  Description   Final    BLOOD SITE NOT SPECIFIED Performed at [facility], [address], [place], [place] [address]    Special Requests   Final    BOTTLES DRAWN AEROBIC AND ANAEROBIC Blood Culture adequate volume Performed at [facility], [address] [place], [place] [address]    Culture   Final    NO GROWTH 3 DAYS Performed at [facility], [address], [place], [place] [address]    Report Status PENDING  Incomplete  Blood Cultures x 2 sites     Status: None (Preliminary result)   Collection Time: 09/25/23 11:07 PM   Specimen: BLOOD  Result Value Ref Range Status   Specimen Description BLOOD SITE NOT SPECIFIED  Final   Special Requests   Final    BOTTLES DRAWN AEROBIC AND ANAEROBIC Blood Culture results may not be optimal due to an inadequate volume of blood received in culture bottles   Culture   Final    NO GROWTH 3 DAYS Performed at [facility], [address] [place], [place] [address]    Report Status PENDING  Incomplete  MRSA Next Gen by PCR, Nasal     Status: None   Collection Time: 09/26/23  8:02 AM   Specimen: Nasal Mucosa; Nasal Swab  Result Value Ref Range Status   MRSA by PCR Next Gen NOT DETECTED NOT DETECTED Final    Comment: (NOTE) The GeneXpert MRSA Assay (FDA approved for NASAL specimens only), is one component of a comprehensive MRSA colonization  surveillance program. It is not intended to diagnose MRSA infection nor to guide or monitor treatment for MRSA infections. Test performance is not FDA approved in patients less than 58 years old. Performed at [facility], [address], [place], [place] [address]      Radiology Studies: VAS US  ABI WITH/WO TBI Result Date: 09/27/2023  LOWER EXTREMITY DOPPLER STUDY Patient Name:  RUEBEN KASSIM  Date of Exam:   09/26/2023 Medical Rec #: [id number]          Accession #:    [id number] Date of Birth: 28-Apr-1955          Patient Gender: M Patient Age:   69 years Exam Location:  [facility] Procedure:      VAS US  ABI WITH/WO TBI Referring Phys: MARCUS DUDA --------------------------------------------------------------------------------  Indications: Peripheral artery disease. High Risk Factors: Diabetes, coronary artery disease, prior CVA.  Comparison Study: No prior Performing Technologist: Jimmye Scarce RVT  Examination Guidelines: A complete evaluation includes at minimum, Doppler waveform signals and systolic blood pressure reading at the level of bilateral brachial, anterior tibial, and posterior tibial arteries, when vessel segments are accessible. Bilateral testing is considered an integral part of a complete examination. Photoelectric Plethysmograph (PPG) waveforms and toe systolic pressure readings are included as required and additional duplex testing as needed. Limited examinations for reoccurring indications may be performed as noted.  ABI Findings: +---------+------------------+-----+---------+--------+ Right    Rt Pressure (mmHg)IndexWaveform Comment  +---------+------------------+-----+---------+--------+ Brachial 140                    triphasic         +---------+------------------+-----+---------+--------+ PTA                             biphasic          +---------+------------------+-----+---------+--------+ DP  triphasic          +---------+------------------+-----+---------+--------+ Great Toe140               1.00 Normal            +---------+------------------+-----+---------+--------+ +---------+------------------+-----+-----------+---------+ Left     Lt Pressure (mmHg)IndexWaveform   Comment   +---------+------------------+-----+-----------+---------+ Brachial                                   paralyzed +---------+------------------+-----+-----------+---------+ PTA                             multiphasic          +---------+------------------+-----+-----------+---------+ DP                              triphasic            +---------+------------------+-----+-----------+---------+ Great Toe72                0.51 Abnormal             +---------+------------------+-----+-----------+---------+ +-------+-----------+-----------+------------+------------+ ABI/TBIToday's ABIToday's TBIPrevious ABIPrevious TBI +-------+-----------+-----------+------------+------------+ Right  N/C        1.0                                 +-------+-----------+-----------+------------+------------+ Left   N/C        0.51                                +-------+-----------+-----------+------------+------------+  Summary: Right: Resting right ankle-brachial index indicates noncompressible right lower extremity arteries. The right toe-brachial index is normal. Left: Resting left ankle-brachial index indicates noncompressible left lower extremity arteries. The left toe-brachial index is abnormal. *See table(s) above for measurements and observations.  Electronically signed by Debby Robertson on 09/27/2023 at 5:04:22 PM.    Final     Scheduled Meds:  amLODipine   10 mg Oral q morning   aspirin  EC  81 mg Oral Daily   atorvastatin   40 mg Oral QPM   baclofen   10 mg Oral BID   carvedilol   6.25 mg Oral BID WC   docusate sodium   100 mg Oral Daily   DULoxetine   60 mg Oral Daily   fentaNYL  (SUBLIMAZE ) injection   50 mcg Intravenous Once   insulin  aspart  0-24 Units Subcutaneous TID WC   levothyroxine   75 mcg Oral QHS   pneumococcal 20-valent conjugate vaccine  0.5 mL Intramuscular Tomorrow-1000   pregabalin   75 mg Oral BID   sacubitril -valsartan   1 tablet Oral BID   sodium chloride  flush  3 mL Intravenous Q12H   Continuous Infusions:  sodium chloride  75 mL/hr at 09/28/23 0817   sodium chloride        LOS: 3 days   Ivonne Mustache, MD [facility] P7/12/2023, 11:00 AM

## 2023-09-28 NOTE — Evaluation (Signed)
 Physical Therapy Evaluation Patient Details Name: Steve Alvarado MRN: [id number] DOB: Dec 06, 1955 Today's Date: 09/28/2023  History of Present Illness  Steve Alvarado is a 68 yo male who presented from SNF with L heel pain. 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 PMHx:  stroke with residual hemiparesis on the left, CKD, coronary artery artery disease, hypertension, hyperlipidemia, hypothyroidism  Clinical Impression  Pt admitted with above diagnosis. Pt from SNF where he has been able to pivot to w/c to right with assist from staff. Pt much more alert and engaged today than during OT session yesterday. Following commands, joking with therapist. Able to come to EOB with mod A. Stood with stedy with max A +2 but had difficulty fully extending R knee and R hip. Patient will benefit from intensive inpatient follow-up therapy, >3 hours/day To be able to d/c to daughter's home which is his goal.  Pt currently with functional limitations due to the deficits listed below (see PT Problem List). Pt will benefit from acute skilled PT to increase their independence and safety with mobility to allow discharge.           If plan is discharge home, recommend the following: Two people to help with walking and/or transfers;Two people to help with bathing/dressing/bathroom;Assistance with cooking/housework;Assist for transportation;Help with stairs or ramp for entrance   Can travel by private vehicle        Equipment Recommendations Other (comment) (TBD)  Recommendations for Other Services  Rehab consult    Functional Status Assessment Patient has had a recent decline in their functional status and demonstrates the ability to make significant improvements in function in a reasonable and predictable amount of time.     Precautions / Restrictions Precautions Precautions: Fall Recall of Precautions/Restrictions: Intact Required Braces or Orthoses: Splint/Cast;Other Brace Splint/Cast:  L resting hand splint, L limb protector Restrictions Weight Bearing Restrictions Per Provider Order: No LLE Weight Bearing Per Provider Order: Non weight bearing Other Position/Activity Restrictions: wound vac      Mobility  Bed Mobility Overal bed mobility: Needs Assistance Bed Mobility: Supine to Sit, Sit to Supine Rolling: Min assist   Supine to sit: Max assist Sit to supine: Mod assist   General bed mobility comments: pt able to roll rt and lt. Max A to LLE and trunk to come to EOB. Able to manage trunk with return to supine but needed mod A to LLE    Transfers Overall transfer level: Needs assistance Equipment used: Ambulation equipment used Transfers: Sit to/from Stand Sit to Stand: From elevated surface, Via lift equipment           General transfer comment: max A +2 to stand to stedy. Had difficulty fully extending Rt knee and hips Transfer via Lift Equipment: Stedy  Ambulation/Gait               General Gait Details: unable at baseline  Stairs            Wheelchair Mobility     Tilt Bed    Modified Rankin (Stroke Patients Only)       Balance Overall balance assessment: Needs assistance Sitting-balance support: Single extremity supported, Feet unsupported Sitting balance-Leahy Scale: Poor Sitting balance - Comments: mod A needed to maintain unsupported sitting due to R lean. Does better with R hand in lap Postural control: Right lateral lean Standing balance support: During functional activity, Bilateral upper extremity supported Standing balance-Leahy Scale: Zero Standing balance comment: max A +2 needed to maintain  standing                             Pertinent Vitals/Pain Pain Assessment Pain Assessment: Faces Faces Pain Scale: Hurts even more Pain Location: LLE Pain Descriptors / Indicators: Discomfort, Grimacing, Guarding Pain Intervention(s): Limited activity within patient's tolerance, Monitored during session     Home Living Family/patient expects to be discharged to:: Skilled nursing facility                   Additional Comments: pt from SNF but plan is to go to daughter's home after a short rehab stay (SNF vs CIR)    Prior Function Prior Level of Function : Needs assist       Physical Assist : Mobility (physical) Mobility (physical): Transfers   Mobility Comments: pt was able to pivot to w/c with assist from staff ADLs Comments: pt reports staff assists with ADLs at bed level     Extremity/Trunk Assessment   Upper Extremity Assessment Upper Extremity Assessment: Defer to OT evaluation    Lower Extremity Assessment Lower Extremity Assessment: LLE deficits/detail;RLE deficits/detail;Generalized weakness RLE Deficits / Details: hip flex 3+/5, knee ext 3+/5 RLE Sensation: WNL RLE Coordination: decreased gross motor LLE Deficits / Details: hip flex 2/5, beginning to have phantom pain. Knee tight. Hemiparetic side LLE Sensation: decreased proprioception LLE Coordination: decreased gross motor    Cervical / Trunk Assessment Cervical / Trunk Assessment: Other exceptions Cervical / Trunk Exceptions: scoliotic  Communication   Communication Factors Affecting Communication: Reduced clarity of speech (doesn't have dentures in)    Cognition Arousal: Alert Behavior During Therapy: WFL for tasks assessed/performed   PT - Cognitive impairments: Sequencing                       PT - Cognition Comments: mildly delayed today but much better than yesterday, alert and joking with therapist and family Following commands: Intact Following commands impaired: Follows multi-step commands with increased time     Cueing Cueing Techniques: Verbal cues     General Comments General comments (skin integrity, edema, etc.): family present end of session. Education on LLE positioning. Has prevalon boot for RLE but pt able to move RLE so instructed to wear it at night but to move  RLE frequently in bed when awake    Exercises Amputee Exercises Hip Flexion/Marching: AAROM, Left, 10 reps, Supine Knee Flexion: Left, 10 reps, Seated, AAROM Knee Extension: Left, 10 reps, Seated, AAROM   Assessment/Plan    PT Assessment Patient needs continued PT services  PT Problem List Decreased strength;Decreased activity tolerance;Decreased range of motion;Decreased balance;Decreased mobility;Impaired sensation;Impaired tone;Decreased skin integrity;Pain;Decreased knowledge of precautions;Decreased safety awareness;Decreased knowledge of use of DME;Decreased coordination       PT Treatment Interventions DME instruction;Functional mobility training;Therapeutic activities;Therapeutic exercise;Balance training;Neuromuscular re-education;Cognitive remediation;Patient/family education;Wheelchair mobility training    PT Goals (Current goals can be found in the Care Plan section)  Acute Rehab PT Goals Patient Stated Goal: d/c to daughter's home PT Goal Formulation: With patient/family Time For Goal Achievement: 10/12/23 Potential to Achieve Goals: Good    Frequency Min 2X/week     Co-evaluation               AM-PAC PT 6 Clicks Mobility  Outcome Measure Help needed turning from your back to your side while in a flat bed without using bedrails?: A Lot Help needed moving from lying on your back to sitting  on the side of a flat bed without using bedrails?: A Lot Help needed moving to and from a bed to a chair (including a wheelchair)?: Total Help needed standing up from a chair using your arms (e.g., wheelchair or bedside chair)?: Total Help needed to walk in hospital room?: Total Help needed climbing 3-5 steps with a railing? : Total 6 Click Score: 8    End of Session Equipment Utilized During Treatment: Gait belt Activity Tolerance: Patient tolerated treatment well Patient left: in bed;with call bell/phone within reach;with bed alarm set;with family/visitor  present Nurse Communication: Mobility status PT Visit Diagnosis: Muscle weakness (generalized) (M62.81);Pain;Other abnormalities of gait and mobility (R26.89) Pain - Right/Left: Left Pain - part of body: Leg    Time: 1300-1341 PT Time Calculation (min) (ACUTE ONLY): 41 min   Charges:   PT Evaluation $PT Eval Moderate Complexity: 1 Mod PT Treatments $Therapeutic Activity: 23-37 mins PT General Charges $$ ACUTE PT VISIT: 1 Visit         Steve Alvarado, PT  Acute Rehab Services Secure chat preferred Office [phone]   Steve Alvarado 09/28/2023, 2:13 PM

## 2023-09-28 NOTE — Progress Notes (Signed)
  Inpatient Rehabilitation Admissions Coordinator   Per [facility], daughter may want to pursue patient discharging home and not return to SNF. I await both PT And OT evals to assess candidacy.  Heron Leavell, RN, MSN Rehab Admissions Coordinator [phone] 09/28/2023 10:52 AM

## 2023-09-28 NOTE — Progress Notes (Signed)
   Inpatient Rehabilitation Admissions Coordinator   Met with patient and daughter, [name] and surrogate daughter Rosaria at bedside for rehab assessment. We discussed goals and expectations of a possible CIR admit. [name] intends for patient not to return to [facility] ( he has been there for 1 year). Plan to d/c to [name]'s home who can arrange 24/7 assist. Ramp is being built by his son.  They prefer CIR for rehab. I will have Rehab MD to consult in the am and then I will begin Auth with [facility] medicare for possible Cir admit. [name] and patient are aware that his average LOS would be 7 to 10 days. Please call me with any questions.   Heron Leavell, RN, MSN Rehab Admissions Coordinator [phone]

## 2023-09-29 ENCOUNTER — Inpatient Hospital Stay (HOSPITAL_COMMUNITY)

## 2023-09-29 DIAGNOSIS — Z89512 Acquired absence of left leg below knee: Secondary | ICD-10-CM

## 2023-09-29 DIAGNOSIS — M86179 Other acute osteomyelitis, unspecified ankle and foot: Secondary | ICD-10-CM | POA: Diagnosis not present

## 2023-09-29 LAB — BASIC METABOLIC PANEL WITH GFR
Anion gap: 12 (ref 5–15)
BUN: 51 mg/dL — ABNORMAL HIGH (ref 8–23)
CO2: 17 mmol/L — ABNORMAL LOW (ref 22–32)
Calcium: 8.5 mg/dL — ABNORMAL LOW (ref 8.9–10.3)
Chloride: 108 mmol/L (ref 98–111)
Creatinine, Ser: 2.08 mg/dL — ABNORMAL HIGH (ref 0.61–1.24)
GFR, Estimated: 34 mL/min — ABNORMAL LOW (ref >60)
Glucose, Bld: 204 mg/dL — ABNORMAL HIGH (ref 70–99)
Potassium: 4.5 mmol/L (ref 3.5–5.1)
Sodium: 137 mmol/L (ref 135–145)

## 2023-09-29 LAB — BLOOD GAS, ARTERIAL
Acid-base deficit: 8.1 mmol/L — ABNORMAL HIGH (ref 0.0–2.0)
Bicarbonate: 17 mmol/L — ABNORMAL LOW (ref 20.0–28.0)
O2 Saturation: 97.5 %
Patient temperature: 37.4
pCO2 arterial: 34 mmHg (ref 32–48)
pH, Arterial: 7.31 — ABNORMAL LOW (ref 7.35–7.45)
pO2, Arterial: 77 mmHg — ABNORMAL LOW (ref 83–108)

## 2023-09-29 LAB — CBC
HCT: 28.6 % — ABNORMAL LOW (ref 39.0–52.0)
Hemoglobin: 9.3 g/dL — ABNORMAL LOW (ref 13.0–17.0)
MCH: 28.6 pg (ref 26.0–34.0)
MCHC: 32.5 g/dL (ref 30.0–36.0)
MCV: 88 fL (ref 80.0–100.0)
Platelets: 322 K/uL (ref 150–400)
RBC: 3.25 MIL/uL — ABNORMAL LOW (ref 4.22–5.81)
RDW: 13 % (ref 11.5–15.5)
WBC: 11.7 K/uL — ABNORMAL HIGH (ref 4.0–10.5)
nRBC: 0 % (ref 0.0–0.2)

## 2023-09-29 LAB — GLUCOSE, CAPILLARY
Glucose-Capillary: 183 mg/dL — ABNORMAL HIGH (ref 70–99)
Glucose-Capillary: 201 mg/dL — ABNORMAL HIGH (ref 70–99)
Glucose-Capillary: 151 mg/dL — ABNORMAL HIGH (ref 70–99)
Glucose-Capillary: 150 mg/dL — ABNORMAL HIGH (ref 70–99)

## 2023-09-29 LAB — SURGICAL PATHOLOGY

## 2023-09-29 MED ORDER — SENNOSIDES-DOCUSATE SODIUM 8.6-50 MG PO TABS
1.0000 | ORAL_TABLET | Freq: Two times a day (BID) | ORAL | Status: DC
  Administered 2023-09-29 – 2023-10-12 (×22): 1 via ORAL
  Filled 2023-09-29 (×28): qty 1

## 2023-09-29 MED ORDER — POLYETHYLENE GLYCOL 3350 17 G PO PACK
17.0000 g | PACK | Freq: Two times a day (BID) | ORAL | Status: DC
  Administered 2023-09-29 – 2023-10-01 (×4): 17 g via ORAL
  Filled 2023-09-29 (×13): qty 1

## 2023-09-29 MED ORDER — OXYCODONE HCL 5 MG PO TABS
5.0000 mg | ORAL_TABLET | Freq: Four times a day (QID) | ORAL | Status: DC | PRN
  Administered 2023-09-30 – 2023-10-01 (×3): 5 mg via ORAL
  Filled 2023-09-29 (×4): qty 1

## 2023-09-29 MED ORDER — BISACODYL 10 MG RE SUPP
10.0000 mg | Freq: Once | RECTAL | Status: AC
  Administered 2023-09-29: 10 mg via RECTAL
  Filled 2023-09-29: qty 1

## 2023-09-29 MED ORDER — POLYETHYLENE GLYCOL 3350 17 G PO PACK
17.0000 g | PACK | Freq: Every day | ORAL | Status: DC
  Filled 2023-09-29: qty 1

## 2023-09-29 MED ORDER — SODIUM CHLORIDE 0.9 % IV SOLN: INTRAVENOUS | Status: AC

## 2023-09-29 MED ORDER — SODIUM CHLORIDE 0.9 % IV SOLN: INTRAVENOUS | Status: DC

## 2023-09-29 NOTE — TOC Initial Note (Signed)
 Transition of Care [facility]) - Initial/Assessment Note    Patient Details  Name: Steve Alvarado MRN: [id number] Date of Birth: 02-10-1956  Transition of Care [facility]) CM/SW Contact:    Bridget Cordella Simmonds, LCSW Phone Number: 09/29/2023, 4:03 PM  Clinical Narrative:   Pt listed as oriented x4 but does not participate in conversation very much, information from daughters [name] and Rosaria.  Pt has been LTC at [facility] for the past year.  They have not had good experience there.  Discussed PT recommendation for STR.  They are agreeable to this but would like new SNF facility.  They do not plan to have pt remain in LTC this time but will take him home after STR.  Permission given to send out referral in hub, medicare choice document provided.  Referral sent out in hub for SNF.                 Expected Discharge Plan: Skilled Nursing Facility Barriers to Discharge: Continued Medical Work up, SNF Pending bed offer   Patient Goals and CMS Choice     Choice offered to / list presented to : Adult Children (daughters [name] and Rosaria)      Expected Discharge Plan and Services In-house Referral: Clinical Social Work   Post Acute Care Choice: Skilled Nursing Facility Living arrangements for the past 2 months: Skilled Nursing Facility [facility])                                      Prior Living Arrangements/Services Living arrangements for the past 2 months: Skilled Nursing Facility [facility]) Lives with:: Facility Resident Patient language and need for interpreter reviewed:: Yes        Need for Family Participation in Patient Care: Yes (Comment) Care giver support system in place?: Yes (comment) Current home services: Other (comment) (na) Criminal Activity/Legal Involvement Pertinent to Current Situation/Hospitalization: No - Comment as needed  Activities of Daily Living   ADL Screening (condition at time of admission) Independently performs ADLs?:  No Does the patient have a NEW difficulty with bathing/dressing/toileting/self-feeding that is expected to last >3 days?: No Does the patient have a NEW difficulty with getting in/out of bed, walking, or climbing stairs that is expected to last >3 days?: No Does the patient have a NEW difficulty with communication that is expected to last >3 days?: No Is the patient deaf or have difficulty hearing?: No Does the patient have difficulty seeing, even when wearing glasses/contacts?: No Does the patient have difficulty concentrating, remembering, or making decisions?: Yes  Permission Sought/Granted                  Emotional Assessment Appearance:: Appears stated age Attitude/Demeanor/Rapport: Lethargic Affect (typically observed): Unable to Assess        Admission diagnosis:  Acute osteomyelitis of calcaneum, unspecified laterality [facility]) [M86.179] Patient Active Problem List   Diagnosis Date Noted   Pressure injury of left heel, stage 4 (HCC) 09/27/2023   Osteomyelitis of left foot (HCC) 09/26/2023   Gangrene of left foot (HCC) 09/26/2023   Acute osteomyelitis of calcaneum, unspecified laterality (HCC) 09/25/2023   Sepsis secondary to UTI (HCC) 06/05/2023   Acute metabolic encephalopathy 03/26/2023   Hyperkalemia 03/26/2023   Anemia of chronic disease 03/26/2023   History of CAD (coronary artery disease) 03/26/2023   Insulin  dependent type 2 diabetes mellitus (HCC) 03/26/2023   History of depression  03/26/2023   History of CVA (cerebrovascular accident) 03/26/2023   Malnutrition of moderate degree 09/01/2022   UTI (urinary tract infection) 08/29/2022   Chronic anemia 08/28/2022   Unable to care for self 08/28/2022   Intractable diarrhea 07/26/2022   E. coli UTI 07/26/2022   Uncontrolled type 2 diabetes mellitus with hyperglycemia, with long-term current use of insulin  (HCC) 07/26/2022   Chronic diastolic CHF (congestive heart failure) (HCC) 07/26/2022   Coronary artery  disease 07/26/2022   GERD with esophagitis 07/26/2022   Demand ischemia (HCC)    Urinary tract infection, recurrent 11/08/2021   NSTEMI, Type ll (non-ST elevated myocardial infarction) (HCC) 11/07/2021   Acute on chronic heart failure with preserved ejection fraction (HFpEF) (HCC) 11/07/2021   Hematuria 11/07/2021   Acute on chronic blood loss anemia 11/07/2021   Hemiparesis affecting left side as late effect of cerebrovascular accident (CVA) (HCC) 11/07/2021   Acute respiratory failure with hypoxia (HCC) 11/07/2021   CKD stage 3a, GFR 45-59 ml/min (HCC) 08/21/2021   GERD without esophagitis 08/20/2021   Basal ganglia stroke (HCC) 08/12/2021   Type 2 diabetes mellitus with diabetic polyneuropathy, with long-term current use of insulin  (HCC) 07/09/2021   Hypothyroidism 07/09/2021   Depression 10/21/2020   Non compliance w medication regimen 10/21/2020   Cocaine abuse (HCC) 09/05/2020   Adjustment disorder with emotional disturbance 09/05/2020   Hypertensive urgency 03/08/2015   Essential hypertension 02/14/2011   Coronary artery disease involving native coronary artery of native heart without angina pectoris 08/13/2010   Stented coronary artery 08/13/2010   PCP:  Celinda Chrystal HERO, MD Pharmacy:   [facility] [address] ([address]), [place] - [address] [place]) [place] [address] Phone: [phone] Fax: [phone]     Social Drivers of Health (SDOH) Social History: SDOH Screenings   Food Insecurity: No Food Insecurity (09/25/2023)  Housing: Low Risk  (09/25/2023)  Transportation Needs: No Transportation Needs (09/25/2023)  Utilities: Not At Risk (09/25/2023)  Depression (PHQ2-9): Low Risk  (12/31/2021)  Financial Resource Strain: High Risk (10/26/2020)   Received from [facility]  Social Connections: Unknown (09/25/2023)  Tobacco Use: Low Risk  (09/27/2023)   SDOH Interventions:     Readmission Risk Interventions    06/07/2023    11:17 AM 11/08/2021   11:09 AM  Readmission Risk Prevention Plan  Transportation Screening Complete Complete  Medication Review (RN Care Manager) Complete Complete  PCP or Specialist appointment within 3-5 days of discharge Complete Complete  HRI or Home Care Consult Complete   SW Recovery Care/Counseling Consult Complete Complete  Palliative Care Screening Not Applicable Not Applicable  Skilled Nursing Facility Complete Complete

## 2023-09-29 NOTE — [place] FL2 (Signed)
 [place]  MEDICAID FL2 LEVEL OF CARE FORM     IDENTIFICATION  Patient Name: BRODRIC SCHAUER Birthdate: 1955/11/30 Sex: male Admission Date (Current Location): 09/25/2023  [facility] and [place] Number:  Producer, television/film/video and Address:  [facility] [place] [place], [address] [place], [place] [address]      Provider Number: [id number]  Attending Physician Name and Address:  Jillian Buttery, MD  Relative Name and Phone Number:       Current Level of Care: Hospital Recommended Level of Care: Skilled Nursing Facility Prior Approval Number:    Date Approved/Denied:   PASRR Number: [id number]  Discharge Plan: SNF    Current Diagnoses: Patient Active Problem List   Diagnosis Date Noted   Pressure injury of left heel, stage 4 (HCC) 09/27/2023   Osteomyelitis of left foot (HCC) 09/26/2023   Gangrene of left foot (HCC) 09/26/2023   Acute osteomyelitis of calcaneum, unspecified laterality (HCC) 09/25/2023   Sepsis secondary to UTI (HCC) 06/05/2023   Acute metabolic encephalopathy 03/26/2023   Hyperkalemia 03/26/2023   Anemia of chronic disease 03/26/2023   History of CAD (coronary artery disease) 03/26/2023   Insulin  dependent type 2 diabetes mellitus (HCC) 03/26/2023   History of depression 03/26/2023   History of CVA (cerebrovascular accident) 03/26/2023   Malnutrition of moderate degree 09/01/2022   UTI (urinary tract infection) 08/29/2022   Chronic anemia 08/28/2022   Unable to care for self 08/28/2022   Intractable diarrhea 07/26/2022   E. coli UTI 07/26/2022   Uncontrolled type 2 diabetes mellitus with hyperglycemia, with long-term current use of insulin  (HCC) 07/26/2022   Chronic diastolic CHF (congestive heart failure) (HCC) 07/26/2022   Coronary artery disease 07/26/2022   GERD with esophagitis 07/26/2022   Demand ischemia (HCC)    Urinary tract infection, recurrent 11/08/2021   NSTEMI, Type ll (non-ST elevated myocardial infarction) (HCC)  11/07/2021   Acute on chronic heart failure with preserved ejection fraction (HFpEF) (HCC) 11/07/2021   Hematuria 11/07/2021   Acute on chronic blood loss anemia 11/07/2021   Hemiparesis affecting left side as late effect of cerebrovascular accident (CVA) (HCC) 11/07/2021   Acute respiratory failure with hypoxia (HCC) 11/07/2021   CKD stage 3a, GFR 45-59 ml/min (HCC) 08/21/2021   GERD without esophagitis 08/20/2021   Basal ganglia stroke (HCC) 08/12/2021   Type 2 diabetes mellitus with diabetic polyneuropathy, with long-term current use of insulin  (HCC) 07/09/2021   Hypothyroidism 07/09/2021   Depression 10/21/2020   Non compliance w medication regimen 10/21/2020   Cocaine abuse (HCC) 09/05/2020   Adjustment disorder with emotional disturbance 09/05/2020   Hypertensive urgency 03/08/2015   Essential hypertension 02/14/2011   Coronary artery disease involving native coronary artery of native heart without angina pectoris 08/13/2010   Stented coronary artery 08/13/2010    Orientation RESPIRATION BLADDER Height & Weight     Self, Situation, Place  Normal Incontinent Weight: 223 lb 8.7 oz (101.4 kg) Height:  6' 2 (188 cm)  BEHAVIORAL SYMPTOMS/MOOD NEUROLOGICAL BOWEL NUTRITION STATUS        Diet (See dc summary)  AMBULATORY STATUS COMMUNICATION OF NEEDS Skin   Total Care Verbally Surgical wounds                       Personal Care Assistance Level of Assistance  Bathing, Feeding, Dressing, Total care Bathing Assistance: Maximum assistance Feeding assistance: Limited assistance Dressing Assistance: Maximum assistance Total Care Assistance: Maximum assistance   Functional Limitations Info  Sight, Hearing,  Speech Sight Info: Adequate Hearing Info: Impaired Speech Info: Adequate    SPECIAL CARE FACTORS FREQUENCY  PT (By licensed PT), OT (By licensed OT)     PT Frequency: 5x a week OT Frequency: 5x a week            Contractures Contractures Info: Not present     Additional Factors Info  Insulin  Sliding Scale Code Status Info: Full Allergies Info: Ace Inhibitors   Insulin  Sliding Scale Info: Novolog : see discharge summary       Current Medications (09/29/2023):  This is the current hospital active medication list Current Facility-Administered Medications  Medication Dose Route Frequency Provider Last Rate Last Admin   0.9 %  sodium chloride  infusion   Intravenous Continuous Jillian Buttery, MD 75 mL/hr at 09/29/23 1447 New Bag at 09/29/23 1447   acetaminophen  (TYLENOL ) tablet 650 mg  650 mg Oral Q6H PRN Gerome Maurilio HERO, PA-C   650 mg at 09/26/23 1546   Or   acetaminophen  (TYLENOL ) suppository 650 mg  650 mg Rectal Q6H PRN Gerome Maurilio HERO, PA-C       amLODipine  (NORVASC ) tablet 10 mg  10 mg Oral q morning Gerome Maurilio M, PA-C   10 mg at 09/28/23 9180   aspirin  EC tablet 81 mg  81 mg Oral Daily Gerome Maurilio M, PA-C   81 mg at 09/28/23 0820   atorvastatin  (LIPITOR) tablet 40 mg  40 mg Oral QPM Gerome Maurilio M, PA-C   40 mg at 09/28/23 1757   carvedilol  (COREG ) tablet 6.25 mg  6.25 mg Oral BID WC Gerome Maurilio M, PA-C   6.25 mg at 09/28/23 1757   clopidogrel  (PLAVIX ) tablet 75 mg  75 mg Oral Daily Jillian Buttery, MD   75 mg at 09/28/23 1343   docusate sodium  (COLACE) capsule 100 mg  100 mg Oral Daily Gerome Maurilio M, PA-C   100 mg at 09/28/23 0820   DULoxetine  (CYMBALTA ) DR capsule 60 mg  60 mg Oral Daily Gerome Maurilio M, PA-C   60 mg at 09/28/23 0820   heparin  injection 5,000 Units  5,000 Units Subcutaneous Q8H Jillian Buttery, MD   5,000 Units at 09/29/23 1447   hydrALAZINE  (APRESOLINE ) injection 10 mg  10 mg Intravenous Q6H PRN Gerome Maurilio M, PA-C       insulin  aspart (novoLOG ) injection 0-24 Units  0-24 Units Subcutaneous TID WC Gerome Maurilio HERO, PA-C   8 Units at 09/29/23 1447   labetalol  (NORMODYNE ) injection 10 mg  10 mg Intravenous Q10 min PRN Gerome Maurilio M, PA-C       levothyroxine  (SYNTHROID ) tablet 75 mcg  75 mcg Oral QHS Gerome Maurilio M, PA-C   75 mcg at 09/28/23 2045   metoprolol  tartrate (LOPRESSOR ) injection 2-5 mg  2-5 mg Intravenous Q2H PRN Gerome Maurilio M, PA-C       ondansetron  (ZOFRAN ) injection 4 mg  4 mg Intravenous Q6H PRN Gerome Maurilio HERO, PA-C       Oral care mouth rinse  15 mL Mouth Rinse PRN Gerome Maurilio M, PA-C       oxyCODONE  (Oxy IR/ROXICODONE ) immediate release tablet 5 mg  5 mg Oral Q6H PRN Adhikari, Amrit, MD       phenol (CHLORASEPTIC) mouth spray 1 spray  1 spray Mouth/Throat PRN Gerome Maurilio HERO, PA-C       pneumococcal 20-valent conjugate vaccine (PREVNAR 20 ) injection 0.5 mL  0.5 mL Intramuscular Tomorrow-1000 Gerome Maurilio M, PA-C       polyethylene  glycol (MIRALAX  / GLYCOLAX ) packet 17 g  17 g Oral BID Jillian Buttery, MD       potassium chloride  SA (KLOR-CON  M) CR tablet 40-60 mEq  40-60 mEq Oral Daily PRN Gerome Herring M, PA-C       sacubitril -valsartan  (ENTRESTO ) 24-26 mg per tablet  1 tablet Oral BID Gerome Herring M, PA-C   1 tablet at 09/28/23 2045   senna-docusate (Senokot-S) tablet 1 tablet  1 tablet Oral BID Jillian Buttery, MD       sodium chloride  flush (NS) 0.9 % injection 3 mL  3 mL Intravenous Q12H Gerome Herring M, PA-C   3 mL at 09/29/23 1002   sodium chloride  flush (NS) 0.9 % injection 3 mL  3 mL Intravenous PRN Gerome Herring HERO, PA-C         Discharge Medications: Please see discharge summary for a list of discharge medications.  Relevant Imaging Results:  Relevant Lab Results:   Additional Information [id number]  Bridget Cordella Simmonds, LCSW

## 2023-09-29 NOTE — Consult Note (Signed)
 Physical Medicine and Rehabilitation Consult Reason for Consult: Left BKA Referring Physician: Jillian   HPI: Steve Alvarado is a 68 y.o. male with a history of a right basal ganglia stroke 07/2021 with residual left hemiparesis as well as CKD and coronary artery disease who initially presented with left heel pain 3 days ago.  MRI of the left calcaneus indicated osteomyelitis.  Orthopedic surgery was consulted, Dr. Harden, who recommended a left below-knee amputation which was performed on 09/27/2023.  Patient was placed in postoperative VAC per protocol.  Amlodipine  and Coreg  were restarted for hypertension.  Creatinine trended up and IV fluids were started.  Patient with postoperative pain superimposed upon chronic pain.  Patient was living in a skilled nursing facility prior to arrival per family for the last year (+?).  Family seems is hoping to bring him to his daughter's home after any type of rehab stay. In talking with family he required heavy assist with transfers and self-care. He was w/c bound d/t his dense left hemiparesis.    Home: Home Living Family/patient expects to be discharged to:: Skilled nursing facility Additional Comments: pt from SNF but plan is to go to daughter's home after a short rehab stay (SNF vs CIR)  Functional History: Prior Function Prior Level of Function : Needs assist Physical Assist : Mobility (physical) Mobility (physical): Transfers Mobility Comments: pt was able to pivot to w/c with assist from staff ADLs Comments: pt reports staff assists with ADLs at bed level Functional Status:  Mobility: Bed Mobility Overal bed mobility: Needs Assistance Bed Mobility: Supine to Sit, Sit to Supine Rolling: Min assist Supine to sit: Max assist Sit to supine: Mod assist General bed mobility comments: pt able to roll rt and lt. Max A to LLE and trunk to come to EOB. Able to manage trunk with return to supine but needed mod A to LLE Transfers Overall  transfer level: Needs assistance Equipment used: Ambulation equipment used Transfers: Sit to/from Stand Sit to Stand: From elevated surface, Via lift equipment Transfer via Lift Equipment: Stedy General transfer comment: max A +2 to stand to stedy. Had difficulty fully extending Rt knee and hips Ambulation/Gait General Gait Details: unable at baseline    ADL: ADL Overall ADL's : Needs assistance/impaired Eating/Feeding: Set up, Bed level Grooming: Set up, Bed level Upper Body Bathing: Moderate assistance, Bed level Lower Body Bathing: Total assistance, +2 for physical assistance, +2 for safety/equipment, Bed level Upper Body Dressing : Moderate assistance, Bed level Lower Body Dressing: Total assistance, +2 for safety/equipment, +2 for physical assistance, Bed level Toilet Transfer: Total assistance, +2 for physical assistance, +2 for safety/equipment Toileting- Clothing Manipulation and Hygiene: Total assistance, +2 for physical assistance, +2 for safety/equipment Functional mobility during ADLs: Total assistance, +2 for physical assistance, +2 for safety/equipment General ADL Comments: limited by new L BKA and L hemiparesis, impiared cog, pain  Cognition: Cognition Orientation Level: Oriented X4 Cognition Arousal: Alert Behavior During Therapy: WFL for tasks assessed/performed   Review of Systems  Unable to perform ROS: Mental acuity   Past Medical History:  Diagnosis Date   Acute kidney injury (HCC) 08/20/2021   Basal ganglia stroke (HCC)    a. 07/2021 L sided wkns/facial droop/fall-->MRI brain: nonhemorrhagic infarct of posterior R lentiform nucleus and corona radiata measure. Remote lacunar infarcts of post cerebellum bilat and L thalamus.   CHF (congestive heart failure) (HCC)    CKD (chronic kidney disease), stage II    a. 08/2021 AKI w/  creat up to 3.4.   Cocaine use    Coronary artery disease    a. 07/2010 PCI: LAD 74m, LCX 80 diff/small, OM1 61m, RCA 30m (3.0x18  Vision BMS). EF 50%; b. 07/2013 Cath: LM nl, LAD 30p, 20d, LCX 20d, LCX 100, OM1 60, RCA 30p, 23m, RPDA 90-->Med Rx; c. 10/2020 MV: EF 46% (60-65% by echo), no ischemia. 3V cor Ca2+.   Depression    Diabetes mellitus    Type II   Diastolic dysfunction    a. 10/2020 Echo: EF 60-65%, no rwma, mild LVH, GrI DD, nl RV size/fxn; b. 07/2021 Echo: EF 60-65%, no rwma, GrI DD, nl RV fxn, mildly dil LA, triv MR; c. 10/2021 Echo: EF 55-60%, no rwma, GrI DD, nl RV fxn, mild-mod MR, Asc Ao 37mm.   Hyperlipidemia    Hypertension    Hypertensive urgency 03/08/2015   Hypothyroidism    Past Surgical History:  Procedure Laterality Date   AMPUTATION Left 09/27/2023   Procedure: AMPUTATION BELOW KNEE, LEFT;  Surgeon: Harden Jerona GAILS, MD;  Location: [facility] OR;  Service: Orthopedics;  Laterality: Left;   APPLICATION OF WOUND VAC Left 09/27/2023   Procedure: APPLICATION, WOUND VAC;  Surgeon: Harden Jerona GAILS, MD;  Location: [facility] OR;  Service: Orthopedics;  Laterality: Left;   CARDIAC CATHETERIZATION  08/06/2010   Bare metal stent placed in RCA.   HAND SURGERY     left   Family History  Problem Relation Age of Onset   Heart attack Mother    Social History:  reports that he has never smoked. He has never used smokeless tobacco. He reports current drug use. Drug: Cocaine. He reports that he does not drink alcohol. Allergies:  Allergies  Allergen Reactions   Ace Inhibitors Cough    Reaction not listed on MAR   Medications Prior to Admission  Medication Sig Dispense Refill   acetaminophen  (TYLENOL ) 500 MG tablet Take 500 mg by mouth in the morning and at bedtime.     albuterol  (VENTOLIN  HFA) 108 (90 Base) MCG/ACT inhaler Inhale 2 puffs into the lungs every 6 (six) hours as needed for wheezing.     amLODipine  (NORVASC ) 10 MG tablet Take 1 tablet (10 mg total) by mouth every morning. 30 tablet 0   aspirin  EC 81 MG tablet Take 1 tablet (81 mg total) by mouth daily. Swallow whole. (Patient taking differently: Take 81 mg by  mouth in the morning. Swallow whole.) 30 tablet 12   atorvastatin  (LIPITOR) 40 MG tablet Take 1 tablet (40 mg total) by mouth every evening. (Patient taking differently: Take 40 mg by mouth at bedtime.) 30 tablet 0   baclofen  (LIORESAL ) 10 MG tablet Take 10 mg by mouth 2 (two) times daily.     benzonatate  (TESSALON ) 100 MG capsule Take 1 capsule (100 mg total) by mouth every 8 (eight) hours as needed for cough. 20 capsule 0   Calamine-Zinc  Oxide 8-8 % LOTN Apply 1 application  topically in the morning and at bedtime. Apply to scrotum topically every night and day shift for redness.     carboxymethylcellulose (REFRESH PLUS) 0.5 % SOLN Place 2 drops into both eyes every 4 (four) hours as needed (dry eyes).     carvedilol  (COREG ) 6.25 MG tablet Take 1 tablet (6.25 mg total) by mouth 2 (two) times daily with a meal.     cetirizine (ZYRTEC) 10 MG tablet Take 10 mg by mouth at bedtime.     clopidogrel  (PLAVIX ) 75 MG tablet Take 1  tablet (75 mg total) by mouth daily. (Patient taking differently: Take 75 mg by mouth in the morning.)     cycloSPORINE (RESTASIS) 0.05 % ophthalmic emulsion Place 1 drop into both eyes 2 (two) times daily.     dapagliflozin propanediol (FARXIGA) 5 MG TABS tablet Take 5 mg by mouth in the morning.     dextromethorphan-guaiFENesin  (ROBITUSSIN-DM) 10-100 MG/5ML liquid Take 10 mLs by mouth every 6 (six) hours as needed for cough.     DULoxetine  (CYMBALTA ) 60 MG capsule Take 60 mg by mouth in the morning.     ferrous sulfate  325 (65 FE) MG EC tablet Take 1 tablet (325 mg total) by mouth every other day. (Patient taking differently: Take 325 mg by mouth in the morning.) 60 tablet 3   furosemide  (LASIX ) 20 MG tablet Take 1 tablet (20 mg total) by mouth every other day. Monitor volume status and change dose accordingly. 30 tablet 11   Glucagon, rDNA, (GLUCAGON EMERGENCY) 1 MG KIT Inject 1 g into the vein as needed (hypoglycemia).     [EXPIRED] HYDROcodone -acetaminophen  (NORCO/VICODIN)  5-325 MG tablet Take 1 tablet by mouth every 6 (six) hours as needed for moderate pain (pain score 4-6).     insulin  lispro (HUMALOG  KWIKPEN) 100 UNIT/ML KwikPen Inject 5 Units into the skin 3 (three) times daily.     insulin  lispro (HUMALOG  KWIKPEN) 100 UNIT/ML KwikPen Inject 0-9 Units into the skin See admin instructions. Inject as per sliding scale: if 70-120 = 0, 121-150= 1 unit, 151-200 = 3 units, 201-300 = 5 units, 301-350 = 7 units, 351-400 = 9 units. Check 3 times daily.     levothyroxine  (SYNTHROID ) 75 MCG tablet Take 75 mcg by mouth at bedtime. 2200     loperamide  (IMODIUM  A-D) 2 MG tablet Take 2 mg by mouth as needed for diarrhea or loose stools.     melatonin 5 MG TABS Take 5 mg by mouth at bedtime.     Multiple Vitamin (MULTIVITAMIN WITH MINERALS) TABS tablet Take 1 tablet by mouth daily. (Patient taking differently: Take 1 tablet by mouth in the morning.)     ondansetron  (ZOFRAN ) 4 MG tablet Take 4 mg by mouth every 8 (eight) hours as needed for nausea or vomiting.     polyethylene glycol (MIRALAX  / GLYCOLAX ) 17 g packet Take 17 g by mouth daily as needed for mild constipation. 14 each 0   pregabalin  (LYRICA ) 75 MG capsule Take 75 mg by mouth 2 (two) times daily.     sacubitril -valsartan  (ENTRESTO ) 24-26 MG Take 1 tablet by mouth 2 (two) times daily. 60 tablet 3   senna-docusate (SENOKOT-S) 8.6-50 MG tablet Take 1 tablet by mouth 2 (two) times daily. Hold if having loose or frequent stools. (Patient taking differently: Take 2 tablets by mouth 2 (two) times daily. Hold if having loose or frequent stools.)     traZODone  (DESYREL ) 50 MG tablet Take 1 tablet (50 mg total) by mouth at bedtime as needed for sleep.     UNABLE TO FIND 30 mLs by Other route in the morning and at bedtime. Liquid Protein 30cc       Blood pressure (!) 147/78, pulse 84, temperature 99.4 F (37.4 C), temperature source Oral, resp. rate 18, height 6' 2 (1.88 m), weight 101.4 kg, SpO2 100%. Physical  Exam Constitutional:      General: He is in acute distress.     Appearance: He is obese.  HENT:     Head: Normocephalic and atraumatic.  Right Ear: External ear normal.     Left Ear: External ear normal.     Nose: Nose normal.     Mouth/Throat:     Mouth: Mucous membranes are moist.  Eyes:     General: No scleral icterus.    Comments: Made eye contact.  Cardiovascular:     Rate and Rhythm: Normal rate.  Pulmonary:     Effort: Pulmonary effort is normal.  Abdominal:     Palpations: Abdomen is soft.  Musculoskeletal:        General: Swelling and tenderness present.     Cervical back: No rigidity or tenderness.     Comments: Left leg in limb guard. Expectedly swollen  Neurological:     Comments: Pt is somewhat alert. Oriented to [facility]. Continued to say something isn't right, difficult to redirect from that. Likely left inattention. Difficult to assess CN's. MMT: seems to be moving right arm freely and right leg to a lesser extent. Saw no active movement in LUE or LLE. Decreased pain sense on left. DTR's 2 to 3+ left upper. Mild resting tone LUE 1 o 1+/4  Psychiatric:     Comments: Flat to anxious     Results for orders placed or performed during the hospital encounter of 09/25/23 (from the past 24 hours)  Glucose, capillary     Status: Abnormal   Collection Time: 09/28/23  4:46 PM  Result Value Ref Range   Glucose-Capillary 177 (H) 70 - 99 mg/dL  Glucose, capillary     Status: Abnormal   Collection Time: 09/28/23  8:44 PM  Result Value Ref Range   Glucose-Capillary 199 (H) 70 - 99 mg/dL  Glucose, capillary     Status: Abnormal   Collection Time: 09/28/23  9:58 PM  Result Value Ref Range   Glucose-Capillary 181 (H) 70 - 99 mg/dL  Glucose, capillary     Status: Abnormal   Collection Time: 09/29/23  6:09 AM  Result Value Ref Range   Glucose-Capillary 183 (H) 70 - 99 mg/dL  Basic metabolic panel     Status: Abnormal   Collection Time: 09/29/23  6:52 AM   Result Value Ref Range   Sodium 137 135 - 145 mmol/L   Potassium 4.5 3.5 - 5.1 mmol/L   Chloride 108 98 - 111 mmol/L   CO2 17 (L) 22 - 32 mmol/L   Glucose, Bld 204 (H) 70 - 99 mg/dL   BUN 51 (H) 8 - 23 mg/dL   Creatinine, Ser 7.91 (H) 0.61 - 1.24 mg/dL   Calcium  8.5 (L) 8.9 - 10.3 mg/dL   GFR, Estimated 34 (L) >60 mL/min   Anion gap 12 5 - 15  CBC     Status: Abnormal   Collection Time: 09/29/23 10:37 AM  Result Value Ref Range   WBC 11.7 (H) 4.0 - 10.5 K/uL   RBC 3.25 (L) 4.22 - 5.81 MIL/uL   Hemoglobin 9.3 (L) 13.0 - 17.0 g/dL   HCT 71.3 (L) 60.9 - 47.9 %   MCV 88.0 80.0 - 100.0 fL   MCH 28.6 26.0 - 34.0 pg   MCHC 32.5 30.0 - 36.0 g/dL   RDW 86.9 88.4 - 84.4 %   Platelets 322 150 - 400 K/uL   nRBC 0.0 0.0 - 0.2 %  Glucose, capillary     Status: Abnormal   Collection Time: 09/29/23 11:09 AM  Result Value Ref Range   Glucose-Capillary 201 (H) 70 - 99 mg/dL   No results found.  Assessment/Plan: Diagnosis: 68 year old male with osteomyelitis of the left calcaneus status post left below-knee amputation postoperative day #2. Does the need for close, 24 hr/day medical supervision in concert with the patient's rehab needs make it unreasonable for this patient to be served in a less intensive setting? No Co-Morbidities requiring supervision/potential complications:  -n/a Due to bladder management, bowel management, safety, skin/wound care, medication administration, and patient education, does the patient require 24 hr/day rehab nursing? No Does the patient require coordinated care of a physician, rehab nurse, therapy disciplines of PT, OT to address physical and functional deficits in the context of the above medical diagnosis(es)? No Addressing deficits in the following areas: balance, endurance, locomotion, strength, transferring, bowel/bladder control, bathing, dressing, feeding, grooming, toileting, cognition, and psychosocial support Can the patient actively participate in an  intensive therapy program of at least 3 hrs of therapy per day at least 5 days per week? No The potential for patient to make measurable gains while on inpatient rehab is poor Anticipated functional outcomes upon discharge from inpatient rehab are n/a  with PT, n/a with OT, n/a with SLP. Estimated rehab length of stay to reach the above functional goals is: n/a Anticipated discharge destination: ?home Overall Rehab/Functional Prognosis: fair  POST ACUTE RECOMMENDATIONS: This patient's condition is appropriate for continued rehabilitative care in the following setting: SNF Patient has agreed to participate in recommended program. N/A Note that insurance prior authorization may be required for reimbursement for recommended care.  Comment: Pt was requiring significant physical assistance prior to this hospital admission. His left below knee amputation is only going to further compromise is already limited functional abilities. He needs longer term, slower-pace rehabilitation. I'm not sure how he could be managed at the household level if daughter is determined to bring him home.    MEDICAL RECOMMENDATIONS:     I have personally performed a face to face diagnostic evaluation of this patient. Additionally, I have examined the patient's medical record including any pertinent labs and radiographic images.    Thanks,  Arthea ONEIDA Gunther, MD 09/29/2023

## 2023-09-29 NOTE — Progress Notes (Signed)
   Inpatient Rehabilitation Admissions Coordinator   I met at bedside with pt's daughter, [name] and Rosaria. Dr Babs consulted today. With Patient's previous level of function over the past year and now his BKA, it is not felt that he can tolerate the intensity of CIR level rehab. Also that it would be very difficult  for daughter to care for him at home at this time. We recommend SNF level of rehab. Notes patient's somnolence today which is different from yesterday.. Stat ABG'S being obtained now. I will alert TOC that other rehab venues should be pursued once medically appropriate . We will sign off.  Heron Leavell, RN, MSN Rehab Admissions Coordinator [phone] 09/29/2023 2:37 PM

## 2023-09-29 NOTE — Progress Notes (Signed)
 Patient refused taking medication today. He stated your trying to poison me! I'm not taking anything daughters present once when pt stated that as he stated this several times. He refused pain medication and all scheduled medications. Dr. Jillian, Amrit notified.

## 2023-09-29 NOTE — Progress Notes (Addendum)
 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  Pain started since last 3 weeks and also develop  into open wound.  On presentation, lab work showed WBC count of 11.1, creatinine 1.2, ESR of 106.  X-ray of the foot showed subcutaneous gas concerning for possible osteo.  MRI  showed posterior calcaneal osteomyelitis.  Started on broad-spectrum antibiotics, cultures sent.  S/p  left below-knee amputation.  PT/OT evaluation done, recommended acute inpatient rehab.  Waiting for CIR bed.  Assessment & Plan:  Principal Problem:   Acute osteomyelitis of calcaneum, unspecified laterality (HCC) Active Problems:   Osteomyelitis of left foot (HCC)   Gangrene of left foot (HCC)   Pressure injury of left heel, stage 4 (HCC)  Osteomyelitis of the left calcaneus: Presented with left foot pain, open wound.  Inflammatory  markers were elevated.  X-ray/MRI suggestive of osteomyelitis.  Dr. Harden consulted.S/P left below-knee amputation.  Antibiotics stopped.  PT/OT commending acute inpatient rehab.  Hypertension:  On amlodipine , Coreg .  Restarted continue as needed medications for severe hypertension.  History of CVA: On aspirin , Plavix  at home.  He has residual hemiparesis on the left.  He is mostly nonambulatory.  Lives in nursing facility  Hypothyroidism: Continue levothyroxine   AKI on CKD stage IIIa: Baseline creatinine from 1.5-1.8.  Creatinine trended up to the range of 2 today.  Continue gentle IV fluids for today.  Check BMP tomorrow  Chronic pain syndrome: On Lyrica .  HFpEF: Last echo done August 2023 showed EF of 55 to 60%, normal is mostly abnormality, grade 1 diastolic dysfunction.  Currently he is  euvolemic.  On Entresto   Insomnia: On trazodone   Drowsiness: Patient is intermittently drowsy. He is somnolent this mrng. We are holding baclofen , lyrica  , Dilaudid  for now.We will do ABG,CT head  Constipation: Continue bowel regimen. Ordered a suppository,abd xray.Abd distended but not tender,has good bowel sounds    Pressure Injury 06/06/23 Buttocks Right Stage 1 -  Intact skin with non-blanchable redness of a localized area usually over a bony prominence. stage 2 (Active)  06/06/23 1600  Location: Buttocks  Location Orientation: Right  Staging: Stage 1 -  Intact skin with non-blanchable redness of a localized area usually over a bony prominence.  Wound Description (Comments): stage 2  DO NOT USE:  Present on Admission: Yes  Dressing Type Foam - Lift dressing to assess site every shift 09/28/23 2045    DVT prophylaxis:heparin  injection 5,000 Units Start: 09/28/23 1400 SCD's Start: 09/27/23 1237 SCDs Start: 09/25/23 1852     Code Status: Full Code  Family Communication: Discussed with daughter  on phone at bedside on 7/11  Patient status:Inpatient  Patient is from: SNF  Anticipated discharge to: Acute inpatient rehab  Estimated DC date:whenever  possible   Consultants: Orthopedics  Procedures: BKA  Antimicrobials:  Anti-infectives (From admission, onward)    Start     Dose/Rate Route Frequency Ordered Stop   09/27/23 1330  ceFEPIme  (MAXIPIME ) 2 g in sodium chloride  0.9 % 100 mL IVPB        2 g 200 mL/hr over 30 Minutes Intravenous Every 12 hours 09/27/23 0720 09/28/23 0719   09/27/23 1300  ceFAZolin  (ANCEF ) IVPB 2g/100 mL premix        2 g  200 mL/hr over 30 Minutes Intravenous On call to O.R. 09/27/23 0702 09/27/23 0950   09/27/23 1000  vancomycin  (VANCOREADY) IVPB 1500 mg/300 mL        1,500 mg 150 mL/hr over 120 Minutes Intravenous Every 24 hours 09/27/23 0720 09/28/23 1000   09/26/23 1000  vancomycin  (VANCOREADY) IVPB 2000 mg/400 mL  Status:  Discontinued         2,000 mg 200 mL/hr over 120 Minutes Intravenous Every 24 hours 09/26/23 0844 09/27/23 0720   09/26/23 0930  ceFEPIme  (MAXIPIME ) 2 g in sodium chloride  0.9 % 100 mL IVPB  Status:  Discontinued        2 g 200 mL/hr over 30 Minutes Intravenous Every 8 hours 09/26/23 0844 09/27/23 0720   09/26/23 0915  metroNIDAZOLE  (FLAGYL ) IVPB 500 mg        500 mg 100 mL/hr over 60 Minutes Intravenous Every 12 hours 09/26/23 0829 09/28/23 1105   09/25/23 1500  Ampicillin -Sulbactam (UNASYN ) 3 g in sodium chloride  0.9 % 100 mL IVPB       Placed in And Linked Group   3 g 200 mL/hr over 30 Minutes Intravenous  Once 09/25/23 1445 09/25/23 1610   09/25/23 1500  linezolid  (ZYVOX ) tablet 600 mg       Placed in And Linked Group   600 mg Oral  Once 09/25/23 1445 09/25/23 1535       Subjective: Patient seen and examined at bedside today.  Hemodynamically stable.  He was bit drowsy this morning.  Denies any significant pain on the ambulatory side.  We decided to stop the pain medicines for now.  Overall appears comfortable  Objective: Vitals:   09/28/23 1941 09/29/23 0528 09/29/23 0727 09/29/23 0950  BP: (!) 154/67 (!) 160/72 134/64 (!) 147/78  Pulse: 75 84 85 84  Resp: 16  18 18   Temp: 98.2 F (36.8 C) 99.3 F (37.4 C) 98.4 F (36.9 C) 99.4 F (37.4 C)  TempSrc: Oral Oral Rectal Oral  SpO2: 95% 100% 92% 100%  Weight:      Height:        Intake/Output Summary (Last 24 hours) at 09/29/2023 1031 Last data filed at 09/28/2023 2047 Gross per 24 hour  Intake 2175.53 ml  Output 400 ml  Net 1775.53 ml   Filed Weights   09/25/23 2302  Weight: 101.4 kg    Examination:  General exam: Overall comfortable, not in distress,obese HEENT: PERRL Respiratory system:  no wheezes or crackles  Cardiovascular system: S1 & S2 heard, RRR.  Gastrointestinal system: Abdomen is nondistended, soft and nontender. Central nervous system: Awake but drowsy and sleepy, left-sided hemiparesis Extremities: Left BKA,  wound VAC Skin: No rashes, no ulcers,no icterus         Data Reviewed: I have personally reviewed following labs and imaging studies  CBC: Recent Labs  Lab 09/25/23 1515 09/26/23 0621 09/27/23 0522  WBC 11.1* 9.8 9.6  9.4  NEUTROABS 8.3*  --  7.5  HGB 11.2* 10.7* 10.7*  10.7*  HCT 35.3* 33.0* 33.0*  33.3*  MCV 89.6 87.5 88.7  89.8  PLT 401* 383 307  295   Basic Metabolic Panel: Recent Labs  Lab 09/25/23 1515 09/26/23 0621 09/27/23 0522 09/28/23 0733 09/29/23 0652  NA 136 138 131* 136 137  K 4.8 4.6 4.2 4.6 4.5  CL 104 106 101 107 108  CO2 23 20* 19* 18* 17*  GLUCOSE 224* 205* 217* 190* 204*  BUN 37* 35* 44* 49* 51*  CREATININE  1.27* 1.41* 1.87* 2.29* 2.08*  CALCIUM  8.9 8.8* 7.9* 8.4* 8.5*     Recent Results (from the past 240 hours)  Blood Cultures x 2 sites     Status: None (Preliminary result)   Collection Time: 09/25/23  3:15 PM   Specimen: BLOOD  Result Value Ref Range Status   Specimen Description   Final    BLOOD SITE NOT SPECIFIED Performed at [facility], [address] [place], [place] [address]    Special Requests   Final    BOTTLES DRAWN AEROBIC AND ANAEROBIC Blood Culture adequate volume Performed at [facility], [address] [place], [place] [address]    Culture   Final    NO GROWTH 3 DAYS Performed at [facility], [address], [place], [place] [address]    Report Status PENDING  Incomplete  Blood Cultures x 2 sites     Status: None (Preliminary result)   Collection Time: 09/25/23 11:07 PM   Specimen: BLOOD  Result Value Ref Range Status   Specimen Description BLOOD SITE NOT SPECIFIED  Final   Special Requests   Final    BOTTLES DRAWN AEROBIC AND ANAEROBIC Blood Culture results may not be optimal due to an inadequate volume of blood received in culture bottles   Culture   Final    NO GROWTH 3 DAYS Performed at [facility], [address], [place], [place] [address]     Report Status PENDING  Incomplete  MRSA Next Gen by PCR, Nasal     Status: None   Collection Time: 09/26/23  8:02 AM   Specimen: Nasal Mucosa; Nasal Swab  Result Value Ref Range Status   MRSA by PCR Next Gen NOT DETECTED NOT DETECTED Final    Comment: (NOTE) The GeneXpert MRSA Assay (FDA approved for NASAL specimens only), is one component of a comprehensive MRSA colonization surveillance program. It is not intended to diagnose MRSA infection nor to guide or monitor treatment for MRSA infections. Test performance is not FDA approved in patients less than 50 years old. Performed at [facility], [address] [place], [place] [address]      Radiology Studies: No results found.   Scheduled Meds:  amLODipine   10 mg Oral q morning   aspirin  EC  81 mg Oral Daily   atorvastatin   40 mg Oral QPM   carvedilol   6.25 mg Oral BID WC   clopidogrel   75 mg Oral Daily   docusate sodium   100 mg Oral Daily   DULoxetine   60 mg Oral Daily   heparin  injection (subcutaneous)  5,000 Units Subcutaneous Q8H   insulin  aspart  0-24 Units Subcutaneous TID WC   levothyroxine   75 mcg Oral QHS   pneumococcal 20-valent conjugate vaccine  0.5 mL Intramuscular Tomorrow-1000   polyethylene glycol  17 g Oral Daily   pregabalin   75 mg Oral BID   sacubitril -valsartan   1 tablet Oral BID   senna-docusate  1 tablet Oral BID   sodium chloride  flush  3 mL Intravenous Q12H   Continuous Infusions:  sodium chloride  75 mL/hr at 09/29/23 1010   sodium chloride        LOS: 4 days   Ivonne Mustache, MD [facility] P7/01/2024, 10:31 AM

## 2023-09-29 NOTE — Progress Notes (Signed)
 [facility]     Objective 134/64 85 98.4 F (36.9 C) (Rectal) 18 92%  Intake/Output Summary (Last 24 hours) at 09/29/2023 0848 Last data filed at 09/28/2023 2047 Gross per 24 hour  Intake 2175.53 ml  Output 400 ml  Net 1775.53 ml   Left BKA with vac to good suction, no drainage in canister Limb protector in place   Assessment/Planning: POD # 2 left BKA  Vac with good suction pending CIR admission.  Vac csn be maintained for 7-10 days.  If he goes home verses SNF a Prevena vac will be exchanged prior to discharge.  Steve Alvarado 09/29/2023 8:48 AM --  Laboratory Lab Results: Recent Labs    09/27/23 0522  WBC 9.6  9.4  HGB 10.7*  10.7*  HCT 33.0*  33.3*  PLT 307  295   BMET Recent Labs    09/27/23 0522 09/28/23 0733  NA 131* 136  K 4.2 4.6  CL 101 107  CO2 19* 18*  GLUCOSE 217* 190*  BUN 44* 49*  CREATININE 1.87* 2.29*  CALCIUM  7.9* 8.4*    COAG Lab Results  Component Value Date   INR 1.0 09/26/2023   INR 0.9 06/05/2023   INR 1.0 11/07/2021   No results found for: PTT

## 2023-09-29 NOTE — Progress Notes (Signed)
ABG collected and sent to lab. Lab notified. 

## 2023-09-30 ENCOUNTER — Inpatient Hospital Stay (HOSPITAL_COMMUNITY)

## 2023-09-30 DIAGNOSIS — M86179 Other acute osteomyelitis, unspecified ankle and foot: Secondary | ICD-10-CM | POA: Diagnosis not present

## 2023-09-30 LAB — CULTURE, BLOOD (ROUTINE X 2)
Culture: NO GROWTH
Culture: NO GROWTH
Special Requests: ADEQUATE

## 2023-09-30 LAB — BASIC METABOLIC PANEL WITH GFR
Anion gap: 9 (ref 5–15)
BUN: 37 mg/dL — ABNORMAL HIGH (ref 8–23)
CO2: 17 mmol/L — ABNORMAL LOW (ref 22–32)
Calcium: 8.6 mg/dL — ABNORMAL LOW (ref 8.9–10.3)
Chloride: 113 mmol/L — ABNORMAL HIGH (ref 98–111)
Creatinine, Ser: 1.34 mg/dL — ABNORMAL HIGH (ref 0.61–1.24)
GFR, Estimated: 57 mL/min — ABNORMAL LOW (ref >60)
Glucose, Bld: 151 mg/dL — ABNORMAL HIGH (ref 70–99)
Potassium: 4.4 mmol/L (ref 3.5–5.1)
Sodium: 139 mmol/L (ref 135–145)

## 2023-09-30 LAB — GLUCOSE, CAPILLARY
Glucose-Capillary: 130 mg/dL — ABNORMAL HIGH (ref 70–99)
Glucose-Capillary: 155 mg/dL — ABNORMAL HIGH (ref 70–99)
Glucose-Capillary: 140 mg/dL — ABNORMAL HIGH (ref 70–99)
Glucose-Capillary: 140 mg/dL — ABNORMAL HIGH (ref 70–99)
Glucose-Capillary: 113 mg/dL — ABNORMAL HIGH (ref 70–99)

## 2023-09-30 MED ORDER — SODIUM BICARBONATE 650 MG PO TABS
650.0000 mg | ORAL_TABLET | Freq: Three times a day (TID) | ORAL | Status: DC
  Administered 2023-09-30 – 2023-10-12 (×37): 650 mg via ORAL
  Filled 2023-09-30 (×38): qty 1

## 2023-09-30 MED ORDER — ALUM & MAG HYDROXIDE-SIMETH 200-200-20 MG/5ML PO SUSP: 30.0000 mL | Freq: Four times a day (QID) | ORAL | Status: DC | PRN

## 2023-09-30 MED ORDER — PANTOPRAZOLE SODIUM 40 MG PO TBEC
40.0000 mg | DELAYED_RELEASE_TABLET | Freq: Every day | ORAL | Status: DC
  Administered 2023-09-30 – 2023-10-12 (×13): 40 mg via ORAL
  Filled 2023-09-30 (×14): qty 1

## 2023-09-30 NOTE — TOC Progression Note (Addendum)
 Transition of Care [facility]) - Progression Note    Patient Details  Name: Steve Alvarado MRN: [id number] Date of Birth: 02-Sep-1955  [facility] CM/SW Contact  Isaiah Public, LCSWA Phone Number: 09/30/2023, 10:35 AM  Clinical Narrative:     CSW spoke with [name] patients daughter and provided SNF bed offer. Patient currently has one SNF bed offer with [facility]. CSW provided information to patients daughter [name]. [name] would like to review facility offer and will give CSW a call back on SNF choice. CSW will continue to follow.  Expected Discharge Plan: Skilled Nursing Facility Barriers to Discharge: Continued Medical Work up, SNF Pending bed offer  Expected Discharge Plan and Services In-house Referral: Clinical Social Work   Post Acute Care Choice: Skilled Nursing Facility Living arrangements for the past 2 months: Skilled Nursing Facility [facility])                                       Social Determinants of Health (SDOH) Interventions SDOH Screenings   Food Insecurity: No Food Insecurity (09/25/2023)  Housing: Low Risk  (09/25/2023)  Transportation Needs: No Transportation Needs (09/25/2023)  Utilities: Not At Risk (09/25/2023)  Depression (PHQ2-9): Low Risk  (12/31/2021)  Financial Resource Strain: High Risk (10/26/2020)   Received from [facility]  Social Connections: Unknown (09/25/2023)  Tobacco Use: Low Risk  (09/27/2023)    Readmission Risk Interventions    06/07/2023   11:17 AM 11/08/2021   11:09 AM  Readmission Risk Prevention Plan  Transportation Screening Complete Complete  Medication Review [employer]) Complete Complete  PCP or Specialist appointment within 3-5 days of discharge Complete Complete  HRI or Home Care Consult Complete   SW Recovery Care/Counseling Consult Complete Complete  Palliative Care Screening Not Applicable Not Applicable  Skilled Nursing Facility Complete Complete

## 2023-09-30 NOTE — Progress Notes (Signed)
 Physical Therapy Treatment Patient Details Name: Steve Alvarado MRN: [id number] DOB: 06-28-1955 Today's Date: 09/30/2023   History of Present Illness Steve Alvarado is a 68 yo male who presented from SNF with L heel pain. 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 PMHx:  stroke with residual hemiparesis on the left, CKD, coronary artery artery disease, hypertension, hyperlipidemia, hypothyroidism    PT Comments  Pt resting in bed on arrival, declining EOB and OOB activities however agreeable to bed level exercises to for increased ROM and strength on L and strength maintenance on R. Pt continues to be limited by baseline weakness on L requiring hands on assist for increased ROM. Pt able to roll R/L with min A to assist with peri-care. HEP issued with pt able to demo back all exercises and verbalizing understanding of completion between therapies. Pt continues to benefit from skilled PT services to progress toward functional mobility goals.      If plan is discharge home, recommend the following: Two people to help with walking and/or transfers;Two people to help with bathing/dressing/bathroom;Assistance with cooking/housework;Assist for transportation;Help with stairs or ramp for entrance   Can travel by private vehicle        Equipment Recommendations  Other (comment) (TBD)    Recommendations for Other Services       Precautions / Restrictions Precautions Precautions: Fall Recall of Precautions/Restrictions: Intact Required Braces or Orthoses: Splint/Cast;Other Brace Splint/Cast: L resting hand splint, L limb protector Other Brace: L limb protector Restrictions Weight Bearing Restrictions Per Provider Order: No LLE Weight Bearing Per Provider Order: Non weight bearing Other Position/Activity Restrictions: wound vac     Mobility  Bed Mobility Overal bed mobility: Needs Assistance Bed Mobility: Rolling Rolling: Min assist         General bed mobility  comments: min A to roll R/L to assis  with peri care, pt declining EOB or OOB this session    Transfers Overall transfer level: Needs assistance                 General transfer comment: pt declingin    Ambulation/Gait                   Stairs             Wheelchair Mobility     Tilt Bed    Modified Rankin (Stroke Patients Only)       Balance Overall balance assessment: Needs assistance                                          Communication Communication Communication: Impaired Factors Affecting Communication: Reduced clarity of speech (doesn't have dentures in)  Cognition Arousal: Alert Behavior During Therapy: WFL for tasks assessed/performed   PT - Cognitive impairments: Sequencing                         Following commands: Intact Following commands impaired: Follows multi-step commands with increased time    Cueing Cueing Techniques: Verbal cues  Exercises General Exercises - Lower Extremity Ankle Circles/Pumps: AROM, Right, 10 reps, Supine Quad Sets: AROM, Right, 10 reps, Supine Straight Leg Raises: AROM, Right, 10 reps, Supine Amputee Exercises Quad Sets: AROM, Left, 5 reps, Supine Hip ABduction/ADduction: AROM, Left, 10 reps, Supine Hip Flexion/Marching: AAROM, Left, 10 reps, Supine Other Exercises Other Exercises: HEP issued; Access  Code: [email or web address]  URL: [email or web address]  Date: 09/30/2023  Prepared by: Steve Alvarado    General Comments        Pertinent Vitals/Pain Pain Assessment Pain Assessment: Faces Faces Pain Scale: Hurts little more Pain Location: LLE Pain Descriptors / Indicators: Discomfort, Grimacing, Guarding Pain Intervention(s): Limited activity within patient's tolerance, Monitored during session    Home Living                          Prior Function            PT Goals (current goals can now be found in the care plan section) Acute Rehab PT  Goals Patient Stated Goal: d/c to daughter's home PT Goal Formulation: With patient/family Time For Goal Achievement: 10/12/23 Progress towards PT goals: Progressing toward goals    Frequency    Min 2X/week      PT Plan      Co-evaluation              AM-PAC PT 6 Clicks Mobility   Outcome Measure  Help needed turning from your back to your side while in a flat bed without using bedrails?: A Lot Help needed moving from lying on your back to sitting on the side of a flat bed without using bedrails?: A Lot Help needed moving to and from a bed to a chair (including a wheelchair)?: Total Help needed standing up from a chair using your arms (e.g., wheelchair or bedside chair)?: Total Help needed to walk in hospital room?: Total Help needed climbing 3-5 steps with a railing? : Total 6 Click Score: 8    End of Session   Activity Tolerance: Patient tolerated treatment well Patient left: in bed;with call bell/phone within reach;with bed alarm set Nurse Communication: Mobility status PT Visit Diagnosis: Muscle weakness (generalized) (M62.81);Pain;Other abnormalities of gait and mobility (R26.89) Pain - Right/Left: Left Pain - part of body: Leg     Time: 8395-8378 PT Time Calculation (min) (ACUTE ONLY): 17 min  Charges:    $Therapeutic Exercise: 8-22 mins PT General Charges $$ ACUTE PT VISIT: 1 Visit                     Steve Alvarado R. PTA Acute Rehabilitation Services Office: [phone]   Steve Alvarado 09/30/2023, 4:29 PM

## 2023-09-30 NOTE — Progress Notes (Signed)
 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  Pain started since last 3 weeks and also develop  into open wound.  On presentation, lab work showed WBC count of 11.1, creatinine 1.2, ESR of 106.  X-ray of the foot showed subcutaneous gas concerning for possible osteo.  MRI  showed posterior calcaneal osteomyelitis.  Started on broad-spectrum antibiotics, cultures sent.  S/p  left below-knee amputation.  PT/OT evaluation done, recommended acute inpatient rehab but now the plan is for skilled nursing facility.  Medically stable for discharge whenever possible  Assessment & Plan:  Principal Problem:   Acute osteomyelitis of calcaneum, unspecified laterality (HCC) Active Problems:   Osteomyelitis of left foot (HCC)   Gangrene of left foot (HCC)   Pressure injury of left heel, stage 4 (HCC)  Osteomyelitis of the left calcaneus: Presented with left foot pain, open wound.  Inflammatory  markers were elevated.  X-ray/MRI suggestive of osteomyelitis.  Dr. Harden consulted.S/P left below-knee amputation.  Antibiotics stopped.  PT/OT commending acute inpatient rehab.  Recommendation changed to SNF  Hypertension:  On amlodipine , Coreg .  Restarted. continue as needed medications for severe hypertension.  History of CVA: On aspirin , Plavix  at home.  He has residual hemiparesis on the left.  He is mostly nonambulatory.  Lives in nursing facility  Hypothyroidism: Continue levothyroxine   AKI on CKD stage IIIa: Baseline creatinine from 1.5-1.8.  Currently kidney function at baseline  Chronic pain syndrome: On Lyrica .  Currently on hold due to confusion on 7/11.  May need to be restarted on discharge  HFpEF: Last  echo done August 2023 showed EF of 55 to 60%, normal is mostly abnormality, grade 1 diastolic dysfunction.  Currently he is euvolemic.  On Entresto   Insomnia: On trazodone   Drowsiness: You became somnolent on 7/11.  This morning he is fully alert and oriented.  Likely from polypharmacy.  Narcotics held.  Baclofen  on hold.  ABG did not show any hypercarbia.  CT scan showed only chronic changes.  Constipation: Continue bowel regimen.     Pressure Injury 06/06/23 Buttocks Right Stage 1 -  Intact skin with non-blanchable redness of a localized area usually over a bony prominence. stage 2 (Active)  06/06/23 1600  Location: Buttocks  Location Orientation: Right  Staging: Stage 1 -  Intact skin with non-blanchable redness of a localized area usually over a bony prominence.  Wound Description (Comments): stage 2  DO NOT USE:  Present on Admission: Yes  Dressing Type Foam - Lift dressing to assess site every shift 09/29/23 1959    DVT prophylaxis:heparin  injection 5,000 Units Start: 09/28/23 1400 SCD's Start: 09/27/23 1237 SCDs Start: 09/25/23 1852     Code Status: Full Code  Family Communication: Discussed with daughter  [name] on phone on 7/12  Patient status:Inpatient  Patient is from: SNF  Anticipated discharge to: SNF  Estimated DC date:whenever  possible   Consultants: Orthopedics  Procedures: BKA  Antimicrobials:  Anti-infectives (From admission, onward)    Start     Dose/Rate Route Frequency Ordered Stop   09/27/23 1330  ceFEPIme  (MAXIPIME ) 2 g in sodium chloride  0.9 % 100 mL IVPB        2 g 200 mL/hr over 30 Minutes Intravenous Every 12 hours 09/27/23 0720  09/28/23 0719   09/27/23 1300  ceFAZolin  (ANCEF ) IVPB 2g/100 mL premix        2 g 200 mL/hr over 30 Minutes Intravenous On call to O.R. 09/27/23 0702 09/27/23 0950   09/27/23 1000  vancomycin  (VANCOREADY) IVPB 1500 mg/300 mL        1,500 mg 150 mL/hr over 120 Minutes Intravenous Every 24 hours 09/27/23 0720  09/28/23 1000   09/26/23 1000  vancomycin  (VANCOREADY) IVPB 2000 mg/400 mL  Status:  Discontinued        2,000 mg 200 mL/hr over 120 Minutes Intravenous Every 24 hours 09/26/23 0844 09/27/23 0720   09/26/23 0930  ceFEPIme  (MAXIPIME ) 2 g in sodium chloride  0.9 % 100 mL IVPB  Status:  Discontinued        2 g 200 mL/hr over 30 Minutes Intravenous Every 8 hours 09/26/23 0844 09/27/23 0720   09/26/23 0915  metroNIDAZOLE  (FLAGYL ) IVPB 500 mg        500 mg 100 mL/hr over 60 Minutes Intravenous Every 12 hours 09/26/23 0829 09/28/23 1105   09/25/23 1500  Ampicillin -Sulbactam (UNASYN ) 3 g in sodium chloride  0.9 % 100 mL IVPB       Placed in And Linked Group   3 g 200 mL/hr over 30 Minutes Intravenous  Once 09/25/23 1445 09/25/23 1610   09/25/23 1500  linezolid  (ZYVOX ) tablet 600 mg       Placed in And Linked Group   600 mg Oral  Once 09/25/23 1445 09/25/23 1535       Subjective: Patient seen and examined at bedside today.  He looks very comfortable this morning.  Alert and awake.  He was talking on the phone.  This morning he is oriented, tells correct day.  Denies any pain or discomfort  Objective: Vitals:   09/29/23 1537 09/29/23 2022 09/30/23 0430 09/30/23 0741  BP: (!) 146/76 (!) 142/58 (!) 140/82 (!) 144/79  Pulse: 85 87 83 85  Resp: 18 16  16   Temp: 99.7 F (37.6 C) (!) 100.7 F (38.2 C) 99.4 F (37.4 C) 98.8 F (37.1 C)  TempSrc: Rectal  Oral   SpO2: 98% 100% 97% 97%  Weight:      Height:        Intake/Output Summary (Last 24 hours) at 09/30/2023 1009 Last data filed at 09/30/2023 0900 Gross per 24 hour  Intake 235.93 ml  Output 1200 ml  Net -964.07 ml   Filed Weights   09/25/23 2302  Weight: 101.4 kg    Examination:  General exam: Overall comfortable, not in distress,obese HEENT: PERRL Respiratory system:  no wheezes or crackles  Cardiovascular system: S1 & S2 heard, RRR.  Gastrointestinal system: Abdomen is nondistended, soft and nontender. Central nervous  system: Alert and oriented, left residual hemiparesis Extremities: Left BKA wound, wound VAC Skin: No rashes, no ulcers,no icterus       Data Reviewed: I have personally reviewed following labs and imaging studies  CBC: Recent Labs  Lab 09/25/23 1515 09/26/23 0621 09/27/23 0522 09/29/23 1037  WBC 11.1* 9.8 9.6  9.4 11.7*  NEUTROABS 8.3*  --  7.5  --   HGB 11.2* 10.7* 10.7*  10.7* 9.3*  HCT 35.3* 33.0* 33.0*  33.3* 28.6*  MCV 89.6 87.5 88.7  89.8 88.0  PLT 401* 383 307  295 322   Basic Metabolic Panel: Recent Labs  Lab 09/26/23 0621 09/27/23 0522 09/28/23 0733 09/29/23 0652 09/30/23 0656  NA 138 131* 136 137 139  K 4.6 4.2 4.6  4.5 4.4  CL 106 101 107 108 113*  CO2 20* 19* 18* 17* 17*  GLUCOSE 205* 217* 190* 204* 151*  BUN 35* 44* 49* 51* 37*  CREATININE 1.41* 1.87* 2.29* 2.08* 1.34*  CALCIUM  8.8* 7.9* 8.4* 8.5* 8.6*     Recent Results (from the past 240 hours)  Blood Cultures x 2 sites     Status: None   Collection Time: 09/25/23  3:15 PM   Specimen: BLOOD  Result Value Ref Range Status   Specimen Description   Final    BLOOD SITE NOT SPECIFIED Performed at [facility], [address], [place], [place] [address]    Special Requests   Final    BOTTLES DRAWN AEROBIC AND ANAEROBIC Blood Culture adequate volume Performed at [facility], [address], [place], [place] [address]    Culture   Final    NO GROWTH 5 DAYS Performed at [facility], [address], [place], [place] [address]    Report Status 09/30/2023 FINAL  Final  Blood Cultures x 2 sites     Status: None   Collection Time: 09/25/23 11:07 PM   Specimen: BLOOD  Result Value Ref Range Status   Specimen Description BLOOD SITE NOT SPECIFIED  Final   Special Requests   Final    BOTTLES DRAWN AEROBIC AND ANAEROBIC Blood Culture results may not be optimal due to an inadequate volume of blood received in culture bottles   Culture   Final    NO GROWTH  5 DAYS Performed at [facility], [address] [place], [place] [address]    Report Status 09/30/2023 FINAL  Final  MRSA Next Gen by PCR, Nasal     Status: None   Collection Time: 09/26/23  8:02 AM   Specimen: Nasal Mucosa; Nasal Swab  Result Value Ref Range Status   MRSA by PCR Next Gen NOT DETECTED NOT DETECTED Final    Comment: (NOTE) The GeneXpert MRSA Assay (FDA approved for NASAL specimens only), is one component of a comprehensive MRSA colonization surveillance program. It is not intended to diagnose MRSA infection nor to guide or monitor treatment for MRSA infections. Test performance is not FDA approved in patients less than 69 years old. Performed at [facility], [address] [place], [place] [address]      Radiology Studies: CT HEAD WO CONTRAST ( ) Result Date: 09/30/2023 CLINICAL DATA:  Initial evaluation for acute delirium. EXAM: CT HEAD WITHOUT CONTRAST TECHNIQUE: Contiguous axial images were obtained from the base of the skull through the vertex without intravenous contrast. RADIATION DOSE REDUCTION: This exam was performed according to the departmental dose-optimization program which includes automated exposure control, adjustment of the mA and/or kV according to patient size and/or use of iterative reconstruction technique. COMPARISON:  MRI from 03/26/2023 FINDINGS: Brain: Examination mildly degraded by motion. Generalized age-related cerebral atrophy with moderate chronic microvascular ischemic disease. Chronic infarcts involving the right basal ganglia/right thalamus noted. Few small remote cerebellar infarcts. No acute intracranial hemorrhage. No acute large vessel territory infarct. Apparent vague hypodensity overlying the left occipital region felt to be consistent with artifact. No mass lesion, midline shift or mass effect. No hydrocephalus or extra-axial fluid collection. Vascular: No abnormal hyperdense vessel. Calcified atherosclerosis present at  the skull base. Skull: Scalp soft tissues demonstrate no acute finding. Calvarium intact. Sinuses/Orbits: Globes orbital soft tissues within normal limits. Chronic left maxillary sinusitis noted. Additional mild mucosal thickening noted about the  ethmoidal air cells and right maxillary sinus. Trace right mastoid effusion noted, of doubtful significance. Other: None. IMPRESSION: 1. No acute intracranial abnormality. 2. Generalized age-related cerebral atrophy with moderate chronic microvascular ischemic disease, with chronic infarcts about the right basal ganglia/right thalamus and cerebellum. 3. Chronic left maxillary sinusitis. Electronically Signed   By: Morene Hoard M.D.   On: 09/30/2023 01:03   DG Abd 1 View Result Date: 09/29/2023 CLINICAL DATA:  Abdominal distension EXAM: ABDOMEN - 1 VIEW COMPARISON:  CT scan 07/25/2022 FINDINGS: Mild prominence of stool in the colon raising the possibility of constipation although the appearance is less striking in the left colon. Nondilated small bowel loops noted including some clustered loops in the left abdomen, nonspecific and not necessarily abnormal. No centralization of bowel loops or other specific radiographic indicators of ascites. Mild degenerative arthropathy of both hips. IMPRESSION: 1. Mild prominence of stool in the colon raising the possibility of constipation. 2. Mild degenerative arthropathy of both hips. Electronically Signed   By: Ryan Salvage M.D.   On: 09/29/2023 14:27     Scheduled Meds:  amLODipine   10 mg Oral q morning   aspirin  EC  81 mg Oral Daily   atorvastatin   40 mg Oral QPM   carvedilol   6.25 mg Oral BID WC   clopidogrel   75 mg Oral Daily   docusate sodium   100 mg Oral Daily   DULoxetine   60 mg Oral Daily   heparin  injection (subcutaneous)  5,000 Units Subcutaneous Q8H   insulin  aspart  0-24 Units Subcutaneous TID WC   levothyroxine   75 mcg Oral QHS   pneumococcal 20-valent conjugate vaccine  0.5 mL Intramuscular  Tomorrow-1000   polyethylene glycol  17 g Oral BID   sacubitril -valsartan   1 tablet Oral BID   senna-docusate  1 tablet Oral BID   sodium chloride  flush  3 mL Intravenous Q12H   Continuous Infusions:  sodium chloride  75 mL/hr at 09/29/23 1447     LOS: 5 days   Ivonne Mustache, MD [facility] P7/02/2024, 10:09 AM

## 2023-10-01 DIAGNOSIS — M86179 Other acute osteomyelitis, unspecified ankle and foot: Secondary | ICD-10-CM | POA: Diagnosis not present

## 2023-10-01 LAB — GLUCOSE, CAPILLARY
Glucose-Capillary: 124 mg/dL — ABNORMAL HIGH (ref 70–99)
Glucose-Capillary: 114 mg/dL — ABNORMAL HIGH (ref 70–99)
Glucose-Capillary: 103 mg/dL — ABNORMAL HIGH (ref 70–99)
Glucose-Capillary: 95 mg/dL (ref 70–99)

## 2023-10-01 MED ORDER — OXYCODONE HCL 5 MG PO TABS
7.5000 mg | ORAL_TABLET | Freq: Four times a day (QID) | ORAL | Status: DC | PRN
  Administered 2023-10-01 – 2023-10-05 (×7): 7.5 mg via ORAL
  Filled 2023-10-01 (×7): qty 2

## 2023-10-01 MED ORDER — CARVEDILOL 12.5 MG PO TABS
12.5000 mg | ORAL_TABLET | Freq: Two times a day (BID) | ORAL | Status: DC
  Administered 2023-10-01 – 2023-10-12 (×23): 12.5 mg via ORAL
  Filled 2023-10-01 (×24): qty 1

## 2023-10-01 MED ORDER — PREGABALIN 75 MG PO CAPS
75.0000 mg | ORAL_CAPSULE | Freq: Two times a day (BID) | ORAL | Status: DC
  Administered 2023-10-01 – 2023-10-12 (×23): 75 mg via ORAL
  Filled 2023-10-01 (×11): qty 1
  Filled 2023-10-01: qty 3
  Filled 2023-10-01 (×10): qty 1
  Filled 2023-10-01: qty 3
  Filled 2023-10-01: qty 1

## 2023-10-01 NOTE — Plan of Care (Signed)
  Problem: Education: Goal: Knowledge of General Education information will improve Description: Including pain rating scale, medication(s)/side effects and non-pharmacologic comfort measures Outcome: Progressing   Problem: Health Behavior/Discharge Planning: Goal: Ability to manage health-related needs will improve Outcome: Progressing   Problem: Clinical Measurements: Goal: Ability to maintain clinical measurements within normal limits will improve Outcome: Progressing Goal: Will remain free from infection Outcome: Progressing Goal: Diagnostic test results will improve Outcome: Progressing Goal: Respiratory complications will improve Outcome: Progressing Goal: Cardiovascular complication will be avoided Outcome: Progressing   Problem: Activity: Goal: Risk for activity intolerance will decrease Outcome: Progressing   Problem: Nutrition: Goal: Adequate nutrition will be maintained Outcome: Progressing   Problem: Coping: Goal: Level of anxiety will decrease Outcome: Progressing   Problem: Elimination: Goal: Will not experience complications related to bowel motility Outcome: Progressing Goal: Will not experience complications related to urinary retention Outcome: Progressing   Problem: Pain Managment: Goal: General experience of comfort will improve and/or be controlled Outcome: Progressing   Problem: Safety: Goal: Ability to remain free from injury will improve Outcome: Progressing   Problem: Skin Integrity: Goal: Risk for impaired skin integrity will decrease Outcome: Progressing   Problem: Education: Goal: Knowledge of the prescribed therapeutic regimen will improve Outcome: Progressing Goal: Ability to verbalize activity precautions or restrictions will improve Outcome: Progressing Goal: Understanding of discharge needs will improve Outcome: Progressing   Problem: Activity: Goal: Ability to perform//tolerate increased activity and mobilize with assistive  devices will improve Outcome: Progressing   Problem: Clinical Measurements: Goal: Postoperative complications will be avoided or minimized Outcome: Progressing   Problem: Self-Care: Goal: Ability to meet self-care needs will improve Outcome: Progressing   Problem: Self-Concept: Goal: Ability to maintain and perform role responsibilities to the fullest extent possible will improve Outcome: Progressing   Problem: Pain Management: Goal: Pain level will decrease with appropriate interventions Outcome: Progressing   Problem: Skin Integrity: Goal: Demonstration of wound healing without infection will improve Outcome: Progressing

## 2023-10-01 NOTE — Plan of Care (Signed)

## 2023-10-01 NOTE — Progress Notes (Signed)
 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  Pain started since last 3 weeks and also develop  into open wound.  On presentation, lab work showed WBC count of 11.1, creatinine 1.2, ESR of 106.  X-ray of the foot showed subcutaneous gas concerning for possible osteo.  MRI  showed posterior calcaneal osteomyelitis.  Started on broad-spectrum antibiotics, cultures sent.  S/p  left below-knee amputation.  PT/OT evaluation done, recommended acute inpatient rehab but now the plan is for skilled nursing facility.  Medically stable for discharge whenever possible  Assessment & Plan:  Principal Problem:   Acute osteomyelitis of calcaneum, unspecified laterality (HCC) Active Problems:   Osteomyelitis of left foot (HCC)   Gangrene of left foot (HCC)   Pressure injury of left heel, stage 4 (HCC)  Osteomyelitis of the left calcaneus: Presented with left foot pain, open wound.  Inflammatory  markers were elevated.  X-ray/MRI suggestive of osteomyelitis.  Dr. Harden consulted.S/P left below-knee amputation.  Antibiotics stopped.  PT/OT commending acute inpatient rehab.  Recommendation changed to SNF  Hypertension:  On amlodipine , Coreg .  Restarted. continue as needed medications for severe hypertension.  Dose of Coreg  increased.  History of CVA: On aspirin , Plavix  at home.  He has residual hemiparesis on the left.  He is mostly nonambulatory.  Lives in nursing facility  Hypothyroidism: Continue levothyroxine   AKI on CKD stage IIIa: Baseline creatinine from 1.5-1.8.  Currently kidney function at baseline  Chronic pain syndrome: On Lyrica .   HFpEF: Last echo done August 2023 showed EF of 55 to 60%, normal is  mostly abnormality, grade 1 diastolic dysfunction.  Currently he is euvolemic.  On Entresto   Insomnia: On trazodone   Drowsiness: You became somnolent on 7/11.  This morning he remains fully alert and oriented.  Confusion was likely from polypharmacy.   Baclofen  on hold.  ABG did not show any hypercarbia.  CT scan showed only chronic changes.  Constipation: Continue bowel regimen.     Pressure Injury 06/06/23 Buttocks Right Stage 1 -  Intact skin with non-blanchable redness of a localized area usually over a bony prominence. stage 2 (Active)  06/06/23 1600  Location: Buttocks  Location Orientation: Right  Staging: Stage 1 -  Intact skin with non-blanchable redness of a localized area usually over a bony prominence.  Wound Description (Comments): stage 2  DO NOT USE:  Present on Admission: Yes  Dressing Type Foam - Lift dressing to assess site every shift 09/30/23 2030    DVT prophylaxis:heparin  injection 5,000 Units Start: 09/28/23 1400 SCD's Start: 09/27/23 1237 SCDs Start: 09/25/23 1852     Code Status: Full Code  Family Communication: Discussed with daughter  [name] on phone on 7/12  Patient status:Inpatient  Patient is from: SNF  Anticipated discharge to: SNF  Estimated DC date:whenever  possible   Consultants: Orthopedics  Procedures: BKA  Antimicrobials:  Anti-infectives (From admission, onward)    Start     Dose/Rate Route Frequency Ordered Stop   09/27/23 1330  ceFEPIme  (MAXIPIME ) 2 g in sodium chloride  0.9 % 100 mL IVPB        2 g 200 mL/hr over 30 Minutes Intravenous Every 12 hours 09/27/23 0720 09/28/23 0719   09/27/23 1300  ceFAZolin  (ANCEF ) IVPB 2g/100  mL premix        2 g 200 mL/hr over 30 Minutes Intravenous On call to O.R. 09/27/23 0702 09/27/23 0950   09/27/23 1000  vancomycin  (VANCOREADY) IVPB 1500 mg/300 mL        1,500 mg 150 mL/hr over 120 Minutes Intravenous Every 24 hours 09/27/23 0720 09/28/23 1000   09/26/23 1000  vancomycin  (VANCOREADY)  IVPB 2000 mg/400 mL  Status:  Discontinued        2,000 mg 200 mL/hr over 120 Minutes Intravenous Every 24 hours 09/26/23 0844 09/27/23 0720   09/26/23 0930  ceFEPIme  (MAXIPIME ) 2 g in sodium chloride  0.9 % 100 mL IVPB  Status:  Discontinued        2 g 200 mL/hr over 30 Minutes Intravenous Every 8 hours 09/26/23 0844 09/27/23 0720   09/26/23 0915  metroNIDAZOLE  (FLAGYL ) IVPB 500 mg        500 mg 100 mL/hr over 60 Minutes Intravenous Every 12 hours 09/26/23 0829 09/28/23 1105   09/25/23 1500  Ampicillin -Sulbactam (UNASYN ) 3 g in sodium chloride  0.9 % 100 mL IVPB       Placed in And Linked Group   3 g 200 mL/hr over 30 Minutes Intravenous  Once 09/25/23 1445 09/25/23 1610   09/25/23 1500  linezolid  (ZYVOX ) tablet 600 mg       Placed in And Linked Group   600 mg Oral  Once 09/25/23 1445 09/25/23 1535       Subjective: Patient seen and examined at bedside today.  Hemodynamically stable.  Comfortable.  Alert and oriented.  Complains of some pain on the amputated site.  Restarted Lyrica .   Objective: Vitals:   09/30/23 0741 09/30/23 1421 09/30/23 1938 10/01/23 0721  BP: (!) 144/79 (!) 153/74 (!) 151/67 (!) 160/71  Pulse: 85 79 72 78  Resp: 16 16 17 18   Temp: 98.8 F (37.1 C) 98.8 F (37.1 C) 98.1 F (36.7 C) (!) 97.4 F (36.3 C)  TempSrc:   Oral Rectal  SpO2: 97% 100% 96% 99%  Weight:      Height:        Intake/Output Summary (Last 24 hours) at 10/01/2023 1006 Last data filed at 10/01/2023 0837 Gross per 24 hour  Intake 640 ml  Output 1100 ml  Net -460 ml   Filed Weights   09/25/23 2302  Weight: 101.4 kg    Examination:   General exam: Overall comfortable, not in distress,obese HEENT: PERRL Respiratory system:  no wheezes or crackles  Cardiovascular system: S1 & S2 heard, RRR.  Gastrointestinal system: Abdomen is nondistended, soft and nontender. Central nervous system: Alert and oriented, left residual hemiparesis Extremities: Left BKA, wound VAC Skin: No  rashes, no ulcers,no icterus       Data Reviewed: I have personally reviewed following labs and imaging studies  CBC: Recent Labs  Lab 09/25/23 1515 09/26/23 0621 09/27/23 0522 09/29/23 1037  WBC 11.1* 9.8 9.6  9.4 11.7*  NEUTROABS 8.3*  --  7.5  --   HGB 11.2* 10.7* 10.7*  10.7* 9.3*  HCT 35.3* 33.0* 33.0*  33.3* 28.6*  MCV 89.6 87.5 88.7  89.8 88.0  PLT 401* 383 307  295 322   Basic Metabolic Panel: Recent Labs  Lab 09/26/23 0621 09/27/23 0522 09/28/23 0733 09/29/23 0652 09/30/23 0656  NA 138 131* 136 137 139  K 4.6 4.2 4.6 4.5 4.4  CL 106 101 107 108 113*  CO2 20* 19* 18* 17* 17*  GLUCOSE 205* 217* 190* 204*  151*  BUN 35* 44* 49* 51* 37*  CREATININE 1.41* 1.87* 2.29* 2.08* 1.34*  CALCIUM  8.8* 7.9* 8.4* 8.5* 8.6*     Recent Results (from the past 240 hours)  Blood Cultures x 2 sites     Status: None   Collection Time: 09/25/23  3:15 PM   Specimen: BLOOD  Result Value Ref Range Status   Specimen Description   Final    BLOOD SITE NOT SPECIFIED Performed at [facility], [address], [place], [place] [address]    Special Requests   Final    BOTTLES DRAWN AEROBIC AND ANAEROBIC Blood Culture adequate volume Performed at [facility], [address], [place], [place] [address]    Culture   Final    NO GROWTH 5 DAYS Performed at [facility], [address] [place], [place] [address]    Report Status 09/30/2023 FINAL  Final  Blood Cultures x 2 sites     Status: None   Collection Time: 09/25/23 11:07 PM   Specimen: BLOOD  Result Value Ref Range Status   Specimen Description BLOOD SITE NOT SPECIFIED  Final   Special Requests   Final    BOTTLES DRAWN AEROBIC AND ANAEROBIC Blood Culture results may not be optimal due to an inadequate volume of blood received in culture bottles   Culture   Final    NO GROWTH 5 DAYS Performed at [facility], [address] [place], [place] [address]    Report Status  09/30/2023 FINAL  Final  MRSA Next Gen by PCR, Nasal     Status: None   Collection Time: 09/26/23  8:02 AM   Specimen: Nasal Mucosa; Nasal Swab  Result Value Ref Range Status   MRSA by PCR Next Gen NOT DETECTED NOT DETECTED Final    Comment: (NOTE) The GeneXpert MRSA Assay (FDA approved for NASAL specimens only), is one component of a comprehensive MRSA colonization surveillance program. It is not intended to diagnose MRSA infection nor to guide or monitor treatment for MRSA infections. Test performance is not FDA approved in patients less than 54 years old. Performed at [facility], [address] [place], [place] [address]      Radiology Studies: CT HEAD WO CONTRAST ( ) Result Date: 09/30/2023 CLINICAL DATA:  Initial evaluation for acute delirium. EXAM: CT HEAD WITHOUT CONTRAST TECHNIQUE: Contiguous axial images were obtained from the base of the skull through the vertex without intravenous contrast. RADIATION DOSE REDUCTION: This exam was performed according to the departmental dose-optimization program which includes automated exposure control, adjustment of the mA and/or kV according to patient size and/or use of iterative reconstruction technique. COMPARISON:  MRI from 03/26/2023 FINDINGS: Brain: Examination mildly degraded by motion. Generalized age-related cerebral atrophy with moderate chronic microvascular ischemic disease. Chronic infarcts involving the right basal ganglia/right thalamus noted. Few small remote cerebellar infarcts. No acute intracranial hemorrhage. No acute large vessel territory infarct. Apparent vague hypodensity overlying the left occipital region felt to be consistent with artifact. No mass lesion, midline shift or mass effect. No hydrocephalus or extra-axial fluid collection. Vascular: No abnormal hyperdense vessel. Calcified atherosclerosis present at the skull base. Skull: Scalp soft tissues demonstrate no acute finding. Calvarium intact.  Sinuses/Orbits: Globes orbital soft tissues within normal limits. Chronic left maxillary sinusitis noted. Additional mild mucosal thickening noted about the ethmoidal air cells and right maxillary sinus. Trace right mastoid effusion noted, of doubtful significance. Other: None. IMPRESSION: 1. No acute intracranial  abnormality. 2. Generalized age-related cerebral atrophy with moderate chronic microvascular ischemic disease, with chronic infarcts about the right basal ganglia/right thalamus and cerebellum. 3. Chronic left maxillary sinusitis. Electronically Signed   By: Morene Hoard M.D.   On: 09/30/2023 01:03   DG Abd 1 View Result Date: 09/29/2023 CLINICAL DATA:  Abdominal distension EXAM: ABDOMEN - 1 VIEW COMPARISON:  CT scan 07/25/2022 FINDINGS: Mild prominence of stool in the colon raising the possibility of constipation although the appearance is less striking in the left colon. Nondilated small bowel loops noted including some clustered loops in the left abdomen, nonspecific and not necessarily abnormal. No centralization of bowel loops or other specific radiographic indicators of ascites. Mild degenerative arthropathy of both hips. IMPRESSION: 1. Mild prominence of stool in the colon raising the possibility of constipation. 2. Mild degenerative arthropathy of both hips. Electronically Signed   By: Ryan Salvage M.D.   On: 09/29/2023 14:27     Scheduled Meds:  amLODipine   10 mg Oral q morning   aspirin  EC  81 mg Oral Daily   atorvastatin   40 mg Oral QPM   carvedilol   12.5 mg Oral BID WC   clopidogrel   75 mg Oral Daily   docusate sodium   100 mg Oral Daily   DULoxetine   60 mg Oral Daily   heparin  injection (subcutaneous)  5,000 Units Subcutaneous Q8H   insulin  aspart  0-24 Units Subcutaneous TID WC   levothyroxine   75 mcg Oral QHS   pantoprazole   40 mg Oral Daily   pneumococcal 20-valent conjugate vaccine  0.5 mL Intramuscular Tomorrow-1000   polyethylene glycol  17 g Oral BID    sacubitril -valsartan   1 tablet Oral BID   senna-docusate  1 tablet Oral BID   sodium bicarbonate   650 mg Oral TID   sodium chloride  flush  3 mL Intravenous Q12H   Continuous Infusions:     LOS: 6 days   Ivonne Mustache, MD [facility] P7/13/2025, 10:06 AM

## 2023-10-02 DIAGNOSIS — M86179 Other acute osteomyelitis, unspecified ankle and foot: Secondary | ICD-10-CM | POA: Diagnosis not present

## 2023-10-02 LAB — GLUCOSE, CAPILLARY
Glucose-Capillary: 97 mg/dL (ref 70–99)
Glucose-Capillary: 108 mg/dL — ABNORMAL HIGH (ref 70–99)
Glucose-Capillary: 112 mg/dL — ABNORMAL HIGH (ref 70–99)
Glucose-Capillary: 120 mg/dL — ABNORMAL HIGH (ref 70–99)

## 2023-10-02 MED ORDER — HYDROCODONE-ACETAMINOPHEN 5-325 MG PO TABS: 1.0000 | ORAL_TABLET | Freq: Four times a day (QID) | ORAL | 0 refills | Status: AC | PRN

## 2023-10-02 MED ORDER — PREGABALIN 75 MG PO CAPS
75.0000 mg | ORAL_CAPSULE | Freq: Two times a day (BID) | ORAL | 0 refills | Status: DC
Start: 2023-10-02 — End: 2023-10-12

## 2023-10-02 NOTE — Plan of Care (Signed)
  Problem: Education: Goal: Knowledge of General Education information will improve Description: Including pain rating scale, medication(s)/side effects and non-pharmacologic comfort measures Outcome: Progressing   Problem: Clinical Measurements: Goal: Ability to maintain clinical measurements within normal limits will improve Outcome: Progressing Goal: Will remain free from infection Outcome: Progressing Goal: Diagnostic test results will improve Outcome: Progressing Goal: Respiratory complications will improve Outcome: Progressing Goal: Cardiovascular complication will be avoided Outcome: Progressing   Problem: Activity: Goal: Risk for activity intolerance will decrease Outcome: Progressing   Problem: Nutrition: Goal: Adequate nutrition will be maintained Outcome: Progressing   Problem: Coping: Goal: Level of anxiety will decrease Outcome: Progressing   Problem: Elimination: Goal: Will not experience complications related to bowel motility Outcome: Progressing Goal: Will not experience complications related to urinary retention Outcome: Progressing   Problem: Pain Managment: Goal: General experience of comfort will improve and/or be controlled Outcome: Progressing   Problem: Safety: Goal: Ability to remain free from injury will improve Outcome: Progressing   Problem: Skin Integrity: Goal: Risk for impaired skin integrity will decrease Outcome: Progressing   Problem: Education: Goal: Knowledge of the prescribed therapeutic regimen will improve Outcome: Progressing Goal: Ability to verbalize activity precautions or restrictions will improve Outcome: Progressing Goal: Understanding of discharge needs will improve Outcome: Progressing   Problem: Activity: Goal: Ability to perform//tolerate increased activity and mobilize with assistive devices will improve Outcome: Progressing   Problem: Clinical Measurements: Goal: Postoperative complications will be avoided  or minimized Outcome: Progressing   Problem: Pain Management: Goal: Pain level will decrease with appropriate interventions Outcome: Progressing   Problem: Skin Integrity: Goal: Demonstration of wound healing without infection will improve Outcome: Progressing

## 2023-10-02 NOTE — Progress Notes (Signed)
 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  Pain started since last 3 weeks and also develop  into open wound.  On presentation, lab work showed WBC count of 11.1, creatinine 1.2, ESR of 106.  X-ray of the foot showed subcutaneous gas concerning for possible osteo.  MRI  showed posterior calcaneal osteomyelitis.  Started on broad-spectrum antibiotics, cultures sent.  S/p  left below-knee amputation.  PT/OT evaluation done, recommended acute inpatient rehab but now the plan is for skilled nursing facility.  Medically stable for discharge whenever possible  Assessment & Plan:  Principal Problem:   Acute osteomyelitis of calcaneum, unspecified laterality (HCC) Active Problems:   Osteomyelitis of left foot (HCC)   Gangrene of left foot (HCC)   Pressure injury of left heel, stage 4 (HCC)  Osteomyelitis of the left calcaneus: Presented with left foot pain, open wound.  Inflammatory  markers were elevated.  X-ray/MRI suggestive of osteomyelitis.  Dr. Harden consulted.S/P left below-knee amputation.  Antibiotics stopped.  PT/OT commending acute inpatient rehab.  Recommendation changed to SNF  Hypertension:  On amlodipine , Coreg .  Restarted. continue as needed medications for severe hypertension.  Dose of Coreg  increased.  History of CVA: On aspirin , Plavix  at home.  He has residual hemiparesis on the left.  He is mostly nonambulatory.  Lives in nursing facility  Hypothyroidism: Continue levothyroxine   AKI on CKD stage IIIa: Baseline creatinine from 1.5-1.8.  Currently kidney function at baseline  Chronic pain syndrome: On Lyrica .   HFpEF: Last echo done August 2023 showed EF of 55 to 60%, normal is  mostly abnormality, grade 1 diastolic dysfunction.  Currently he is euvolemic.  On Entresto   Insomnia: On trazodone   Drowsiness: You became somnolent on 7/11.  This morning he remains fully alert and oriented.  Confusion was likely from polypharmacy.   Baclofen  on hold.  ABG did not show any hypercarbia.  CT scan showed only chronic changes.  Constipation: Continue bowel regimen.     Pressure Injury 06/06/23 Buttocks Right Stage 1 -  Intact skin with non-blanchable redness of a localized area usually over a bony prominence. stage 2 (Active)  06/06/23 1600  Location: Buttocks  Location Orientation: Right  Staging: Stage 1 -  Intact skin with non-blanchable redness of a localized area usually over a bony prominence.  Wound Description (Comments): stage 2  DO NOT USE:  Present on Admission: Yes  Dressing Type Foam - Lift dressing to assess site every shift 10/01/23 2143    DVT prophylaxis:heparin  injection 5,000 Units Start: 09/28/23 1400 SCD's Start: 09/27/23 1237 SCDs Start: 09/25/23 1852     Code Status: Full Code  Family Communication: Discussed with daughter  [name] on phone on 7/12  Patient status:Inpatient  Patient is from: SNF  Anticipated discharge to: SNF  Estimated DC date:whenever  possible   Consultants: Orthopedics  Procedures: BKA  Antimicrobials:  Anti-infectives (From admission, onward)    Start     Dose/Rate Route Frequency Ordered Stop   09/27/23 1330  ceFEPIme  (MAXIPIME ) 2 g in sodium chloride  0.9 % 100 mL IVPB        2 g 200 mL/hr over 30 Minutes Intravenous Every 12 hours 09/27/23 0720 09/28/23 0719   09/27/23 1300  ceFAZolin  (ANCEF ) IVPB 2g/100  mL premix        2 g 200 mL/hr over 30 Minutes Intravenous On call to O.R. 09/27/23 0702 09/27/23 0950   09/27/23 1000  vancomycin  (VANCOREADY) IVPB 1500 mg/300 mL        1,500 mg 150 mL/hr over 120 Minutes Intravenous Every 24 hours 09/27/23 0720 09/28/23 1000   09/26/23 1000  vancomycin  (VANCOREADY)  IVPB 2000 mg/400 mL  Status:  Discontinued        2,000 mg 200 mL/hr over 120 Minutes Intravenous Every 24 hours 09/26/23 0844 09/27/23 0720   09/26/23 0930  ceFEPIme  (MAXIPIME ) 2 g in sodium chloride  0.9 % 100 mL IVPB  Status:  Discontinued        2 g 200 mL/hr over 30 Minutes Intravenous Every 8 hours 09/26/23 0844 09/27/23 0720   09/26/23 0915  metroNIDAZOLE  (FLAGYL ) IVPB 500 mg        500 mg 100 mL/hr over 60 Minutes Intravenous Every 12 hours 09/26/23 0829 09/28/23 1105   09/25/23 1500  Ampicillin -Sulbactam (UNASYN ) 3 g in sodium chloride  0.9 % 100 mL IVPB       Placed in And Linked Group   3 g 200 mL/hr over 30 Minutes Intravenous  Once 09/25/23 1445 09/25/23 1610   09/25/23 1500  linezolid  (ZYVOX ) tablet 600 mg       Placed in And Linked Group   600 mg Oral  Once 09/25/23 1445 09/25/23 1535       Subjective: Patient seen and examined at the bedside today.  Comfortable.  No active issues.  Pain well-controlled.  Waiting for SNF.  No new change in the medical management  Objective: Vitals:   10/01/23 1509 10/01/23 2143 10/02/23 0554 10/02/23 0705  BP: 134/65 (!) 142/59 (!) 149/66 131/64  Pulse: 67 71 69 66  Resp: 18 18 18 17   Temp: (!) 97 F (36.1 C) 98.6 F (37 C) 98.1 F (36.7 C) 98 F (36.7 C)  TempSrc: Rectal Oral Oral Oral  SpO2: 99% 100% 99% 95%  Weight:      Height:        Intake/Output Summary (Last 24 hours) at 10/02/2023 0945 Last data filed at 10/02/2023 0610 Gross per 24 hour  Intake 1070 ml  Output 350 ml  Net 720 ml   Filed Weights   09/25/23 2302  Weight: 101.4 kg    Examination:   General exam: Overall comfortable, not in distress,obese HEENT: PERRL Respiratory system:  no wheezes or crackles  Cardiovascular system: S1 & S2 heard, RRR.  Gastrointestinal system: Abdomen is nondistended, soft and nontender. Central nervous system: Alert and oriented, left residual hemiparesis Extremities: Left BKA, wound VAC Skin: No rashes, no  ulcers,no icterus         Data Reviewed: I have personally reviewed following labs and imaging studies  CBC: Recent Labs  Lab 09/25/23 1515 09/26/23 0621 09/27/23 0522 09/29/23 1037  WBC 11.1* 9.8 9.6  9.4 11.7*  NEUTROABS 8.3*  --  7.5  --   HGB 11.2* 10.7* 10.7*  10.7* 9.3*  HCT 35.3* 33.0* 33.0*  33.3* 28.6*  MCV 89.6 87.5 88.7  89.8 88.0  PLT 401* 383 307  295 322   Basic Metabolic Panel: Recent Labs  Lab 09/26/23 0621 09/27/23 0522 09/28/23 0733 09/29/23 0652 09/30/23 0656  NA 138 131* 136 137 139  K 4.6 4.2 4.6 4.5 4.4  CL 106 101 107 108 113*  CO2 20* 19* 18* 17* 17*  GLUCOSE 205* 217* 190* 204*  151*  BUN 35* 44* 49* 51* 37*  CREATININE 1.41* 1.87* 2.29* 2.08* 1.34*  CALCIUM  8.8* 7.9* 8.4* 8.5* 8.6*     Recent Results (from the past 240 hours)  Blood Cultures x 2 sites     Status: None   Collection Time: 09/25/23  3:15 PM   Specimen: BLOOD  Result Value Ref Range Status   Specimen Description   Final    BLOOD SITE NOT SPECIFIED Performed at [facility], [address], [place], [place] [address]    Special Requests   Final    BOTTLES DRAWN AEROBIC AND ANAEROBIC Blood Culture adequate volume Performed at [facility], [address] [place], [place] [address]    Culture   Final    NO GROWTH 5 DAYS Performed at [facility], [address], [place], [place] [address]    Report Status 09/30/2023 FINAL  Final  Blood Cultures x 2 sites     Status: None   Collection Time: 09/25/23 11:07 PM   Specimen: BLOOD  Result Value Ref Range Status   Specimen Description BLOOD SITE NOT SPECIFIED  Final   Special Requests   Final    BOTTLES DRAWN AEROBIC AND ANAEROBIC Blood Culture results may not be optimal due to an inadequate volume of blood received in culture bottles   Culture   Final    NO GROWTH 5 DAYS Performed at [facility], [address] [place], [place] [address]    Report Status 09/30/2023  FINAL  Final  MRSA Next Gen by PCR, Nasal     Status: None   Collection Time: 09/26/23  8:02 AM   Specimen: Nasal Mucosa; Nasal Swab  Result Value Ref Range Status   MRSA by PCR Next Gen NOT DETECTED NOT DETECTED Final    Comment: (NOTE) The GeneXpert MRSA Assay (FDA approved for NASAL specimens only), is one component of a comprehensive MRSA colonization surveillance program. It is not intended to diagnose MRSA infection nor to guide or monitor treatment for MRSA infections. Test performance is not FDA approved in patients less than 38 years old. Performed at [facility], [address], [place], [place] [address]      Radiology Studies: No results found.    Scheduled Meds:  amLODipine   10 mg Oral q morning   aspirin  EC  81 mg Oral Daily   atorvastatin   40 mg Oral QPM   carvedilol   12.5 mg Oral BID WC   clopidogrel   75 mg Oral Daily   docusate sodium   100 mg Oral Daily   DULoxetine   60 mg Oral Daily   heparin  injection (subcutaneous)  5,000 Units Subcutaneous Q8H   insulin  aspart  0-24 Units Subcutaneous TID WC   levothyroxine   75 mcg Oral QHS   pantoprazole   40 mg Oral Daily   pneumococcal 20-valent conjugate vaccine  0.5 mL Intramuscular Tomorrow-1000   polyethylene glycol  17 g Oral BID   pregabalin   75 mg Oral BID   sacubitril -valsartan   1 tablet Oral BID   senna-docusate  1 tablet Oral BID   sodium bicarbonate   650 mg Oral TID   sodium chloride  flush  3 mL Intravenous Q12H   Continuous Infusions:     LOS: 7 days   Ivonne Mustache, MD [facility] P7/14/2025, 9:45 AM

## 2023-10-02 NOTE — Progress Notes (Signed)
 [facility]  S/P left BKA   Vac with good suction pending CIR admission verses HH verses SNF.  Vac csn be maintained for 7-10 days.  If he goes home verses SNF a Prevena vac will be exchanged prior to discharge.  Good suction for Prevena transition at discharge.  F/U with Dr. Harden 1 week from surgery.  Maurilio Deland Collet  PA-C

## 2023-10-02 NOTE — Plan of Care (Signed)

## 2023-10-02 NOTE — TOC Progression Note (Addendum)
 Transition of Care [facility]) - Progression Note    Patient Details  Name: Steve Alvarado MRN: [id number] Date of Birth: [birth year]  Transition of Care [facility]) CM/SW Contact  Bridget Cordella Simmonds, LCSW Phone Number: 10/02/2023, 10:21 AM  Clinical Narrative:   Bed offer provided to pt in room, also provided to daughter [name] by phone.  They are asking for higher rated SNF, CSW reached out to remaining facilities that have not responded.  1300: CSW received call from pt sister Rayetta, [phone].  She requested the medicare.gov list of SNFs, which was emailed to her.  CSW attempted to call her back, no answer, LM  1400: CSW spoke with daughter [name] by phone, updated her that pt does not have additional bed offers, only [facility] has offered.  [name] reports she is at work and will have to call back.    1420:; TC Teia, discussed current offers and which SNF have declined to offer beds.  Discussed that pt is stable for DC and still needs insurance auth.  Teia requested pt referral be sent out to SNF locations farther away.  If no offers that are acceptable to the family are made, they are going to have pt DC home with [facility].     Expected Discharge Plan: Skilled Nursing Facility Barriers to Discharge: Continued Medical Work up, SNF Pending bed offer  Expected Discharge Plan and Services In-house Referral: Clinical Social Work   Post Acute Care Choice: Skilled Nursing Facility Living arrangements for the past 2 months: Skilled Nursing Facility [facility])                                       Social Determinants of Health (SDOH) Interventions SDOH Screenings   Food Insecurity: No Food Insecurity (09/25/2023)  Housing: Low Risk  (09/25/2023)  Transportation Needs: No Transportation Needs (09/25/2023)  Utilities: Not At Risk (09/25/2023)  Depression (PHQ2-9): Low Risk  (12/31/2021)  Financial Resource Strain: High Risk (10/26/2020)   Received from [facility]  Social Connections:  Unknown (09/25/2023)  Tobacco Use: Low Risk  (09/27/2023)    Readmission Risk Interventions    06/07/2023   11:17 AM 11/08/2021   11:09 AM  Readmission Risk Prevention Plan  Transportation Screening Complete Complete  Medication Review Oceanographer) Complete Complete  PCP or Specialist appointment within 3-5 days of discharge Complete Complete  HRI or Home Care Consult Complete   SW Recovery Care/Counseling Consult Complete Complete  Palliative Care Screening Not Applicable Not Applicable  Skilled Nursing Facility Complete Complete

## 2023-10-03 DIAGNOSIS — M86179 Other acute osteomyelitis, unspecified ankle and foot: Secondary | ICD-10-CM | POA: Diagnosis not present

## 2023-10-03 LAB — BASIC METABOLIC PANEL WITH GFR
Anion gap: 12 (ref 5–15)
BUN: 23 mg/dL (ref 8–23)
CO2: 18 mmol/L — ABNORMAL LOW (ref 22–32)
Calcium: 8.6 mg/dL — ABNORMAL LOW (ref 8.9–10.3)
Chloride: 104 mmol/L (ref 98–111)
Creatinine, Ser: 1.24 mg/dL (ref 0.61–1.24)
GFR, Estimated: >60 mL/min (ref >60)
Glucose, Bld: 122 mg/dL — ABNORMAL HIGH (ref 70–99)
Potassium: 4 mmol/L (ref 3.5–5.1)
Sodium: 134 mmol/L — ABNORMAL LOW (ref 135–145)

## 2023-10-03 LAB — CBC
HCT: 24.7 % — ABNORMAL LOW (ref 39.0–52.0)
Hemoglobin: 8.3 g/dL — ABNORMAL LOW (ref 13.0–17.0)
MCH: 29.2 pg (ref 26.0–34.0)
MCHC: 33.6 g/dL (ref 30.0–36.0)
MCV: 87 fL (ref 80.0–100.0)
Platelets: 326 K/uL (ref 150–400)
RBC: 2.84 MIL/uL — ABNORMAL LOW (ref 4.22–5.81)
RDW: 13 % (ref 11.5–15.5)
WBC: 7.1 K/uL (ref 4.0–10.5)
nRBC: 0 % (ref 0.0–0.2)

## 2023-10-03 LAB — GLUCOSE, CAPILLARY
Glucose-Capillary: 102 mg/dL — ABNORMAL HIGH (ref 70–99)
Glucose-Capillary: 141 mg/dL — ABNORMAL HIGH (ref 70–99)
Glucose-Capillary: 155 mg/dL — ABNORMAL HIGH (ref 70–99)
Glucose-Capillary: 160 mg/dL — ABNORMAL HIGH (ref 70–99)

## 2023-10-03 NOTE — Progress Notes (Signed)
 Physical Therapy Treatment Patient Details Name: Steve Alvarado MRN: [id number] DOB: [birth year] Today's Date: 10/03/2023   History of Present Illness ZENITH LAMPHIER is a 68 yo male who presented from SNF with L heel pain. 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 Pt seen in conjunction with OT to maximize activity tolerance and safely progress OOB transfers. Pt pleasant throughout session and giving good effort for transfer training completing stand pivot transfer to chair with max A +2 as well as completing x3 sit<>stands form recliner to with max A +2 with cues for weight shift to R over supporting LE. Pt resting with pillow under L knee on arrival with knee flexed. Reviewed education on importance of maintaining knee extension, with pillow placed at distal end of residual limb at end of session to maximize gravity assisted knee extension. Pt continues to benefit from skilled PT services to progress toward functional mobility goals.     If plan is discharge home, recommend the following: Two people to help with walking and/or transfers;Two people to help with bathing/dressing/bathroom;Assistance with cooking/housework;Assist for transportation;Help with stairs or ramp for entrance   Can travel by private vehicle        Equipment Recommendations  Other (comment) (if going home will need hospital bed, wheel chair, hoyer)    Recommendations for Other Services       Precautions / Restrictions Precautions Precautions: Fall Recall of Precautions/Restrictions: Intact Required Braces or Orthoses: Splint/Cast;Other Brace Splint/Cast: L resting hand splint, L limb protector Other Brace: L limb protector Restrictions Weight Bearing  Restrictions Per Provider Order: Yes LLE Weight Bearing Per Provider Order: Non weight bearing Other Position/Activity Restrictions: wound vac     Mobility  Bed Mobility Overal bed mobility: Needs Assistance Bed Mobility: Supine to Sit     Supine to sit: Mod assist, +2 for physical assistance     General bed mobility comments: with cues for sequencing    Transfers Overall transfer level: Needs assistance Equipment used: 2 person hand held assist Transfers: Sit to/from Stand, Bed to chair/wheelchair/BSC Sit to Stand: Max assist, +2 physical assistance, +2 safety/equipment Stand pivot transfers: Max assist, +2 safety/equipment, +2 physical assistance         General transfer comment: pt motivated this date. 3x sit<>stand with max A +2, 1x SP from bed to chair.    Ambulation/Gait               General Gait Details: unable at baseline   Stairs             Wheelchair Mobility     Tilt Bed    Modified Rankin (Stroke Patients Only)       Balance Overall balance assessment: Needs assistance Sitting-balance support: Feet supported, No upper extremity supported Sitting balance-Leahy Scale: Fair Sitting balance - Comments: improved sitting balance this date   Standing balance support: Single extremity supported, During functional activity Standing balance-Leahy Scale: Zero                              Communication Communication Communication: No apparent difficulties  Cognition Arousal: Alert Behavior During Therapy: [facility] for tasks assessed/performed  Following commands: Intact      Cueing Cueing Techniques: Verbal cues  Exercises Amputee Exercises Quad Sets: AROM, Left, 5 reps, Seated (long sitting in chair) Knee Extension: AAROM, Left, 10 reps, Seated    General Comments General comments (skin integrity, edema, etc.): VSS, reinforced eduation on not resting with pillow under knee to promote  extension, pillow placed at distal end of residual limb at end fo session to further promoted gravity assisted extension      Pertinent Vitals/Pain Pain Assessment Pain Assessment: Faces Faces Pain Scale: Hurts a little bit Pain Location: LLE Pain Descriptors / Indicators: Guarding Pain Intervention(s): Monitored during session, Limited activity within patient's tolerance    Home Living                          Prior Function            PT Goals (current goals can now be found in the care plan section) Acute Rehab PT Goals PT Goal Formulation: With patient/family Time For Goal Achievement: 10/12/23 Progress towards PT goals: Progressing toward goals    Frequency    Min 2X/week      PT Plan      Co-evaluation PT/OT/SLP Co-Evaluation/Treatment: Yes Reason for Co-Treatment: Complexity of the patient's impairments (multi-system involvement);For patient/therapist safety;To address functional/ADL transfers PT goals addressed during session: Mobility/safety with mobility;Balance;Proper use of DME;Strengthening/ROM OT goals addressed during session: ADL's and self-care      AM-PAC PT 6 Clicks Mobility   Outcome Measure  Help needed turning from your back to your side while in a flat bed without using bedrails?: A Lot Help needed moving from lying on your back to sitting on the side of a flat bed without using bedrails?: A Lot Help needed moving to and from a bed to a chair (including a wheelchair)?: Total Help needed standing up from a chair using your arms (e.g., wheelchair or bedside chair)?: Total Help needed to walk in hospital room?: Total Help needed climbing 3-5 steps with a railing? : Total 6 Click Score: 8    End of Session Equipment Utilized During Treatment: Gait belt;Other (comment) (L limb protector) Activity Tolerance: Patient tolerated treatment well Patient left: in chair;with call bell/phone within reach;with chair alarm set Nurse  Communication: Mobility status PT Visit Diagnosis: Muscle weakness (generalized) (M62.81);Pain;Other abnormalities of gait and mobility (R26.89) Pain - Right/Left: Left Pain - part of body: Leg     Time: 9172-9146 PT Time Calculation (min) (ACUTE ONLY): 26 min  Charges:    $Therapeutic Activity: 8-22 mins PT General Charges $$ ACUTE PT VISIT: 1 Visit                     Murel Shenberger R. PTA Acute Rehabilitation Services Office: [phone]   Therisa CHRISTELLA Boor 10/03/2023, 9:48 AM

## 2023-10-03 NOTE — TOC Progression Note (Addendum)
 Transition of Care [facility]) - Progression Note    Patient Details  Name: Steve Alvarado MRN: [id number] Date of Birth: Feb 25, 1956  Transition of Care [facility]) CM/SW Contact  Bridget Cordella Simmonds, LCSW Phone Number: 10/03/2023, 10:30 AM  Clinical Narrative:   CSW sent messages to [facility] SNF: [place] and [place], they will review.  0920: [facility] cannot offer, [facility] still reviewing.  1020: CSW LM with sisterTeia requesting call.  CSW spoke with daughter [name], updated her.  She referred CSW to continue planning with Teia as [name] will be at work all day today.  1120: TC Teia.  Discussed current offers.  She would like response from [facility].  She is talking to someone she knows at [facility] in [place] and also [facility].    CSW reached out to [facility].    1320: TC [facility], discussed pt LTC at [place], looking for STR now, will go home with daughter afterwards.  [facility] cannot offer bed.  1440: TC Teia.  Updated her that [facility] cannot offer.  She has not heard back from [facility] or [facility] [place].  Discussed unlikely that pt will receive offers from these places.  Rayetta is in agreement that she will continue to reach out to them but we will begin making arrangements for likely DC home with [facility] and DME.  Pt daughter [name] will be the home that pt DC to.  Jon RNCM updated and will reach out to family.     Expected Discharge Plan: Skilled Nursing Facility Barriers to Discharge: Continued Medical Work up, SNF Pending bed offer  Expected Discharge Plan and Services In-house Referral: Clinical Social Work   Post Acute Care Choice: Skilled Nursing Facility Living arrangements for the past 2 months: Skilled Nursing Facility [facility])                                       Social Determinants of Health (SDOH) Interventions SDOH Screenings   Food Insecurity: No Food Insecurity (09/25/2023)  Housing: Low Risk  (09/25/2023)  Transportation Needs: No Transportation  Needs (09/25/2023)  Utilities: Not At Risk (09/25/2023)  Depression (PHQ2-9): Low Risk  (12/31/2021)  Financial Resource Strain: High Risk (10/26/2020)   Received from [facility]  Social Connections: Unknown (09/25/2023)  Tobacco Use: Low Risk  (09/27/2023)    Readmission Risk Interventions    06/07/2023   11:17 AM 11/08/2021   11:09 AM  Readmission Risk Prevention Plan  Transportation Screening Complete Complete  Medication Review Oceanographer) Complete Complete  PCP or Specialist appointment within 3-5 days of discharge Complete Complete  HRI or Home Care Consult Complete   SW Recovery Care/Counseling Consult Complete Complete  Palliative Care Screening Not Applicable Not Applicable  Skilled Nursing Facility Complete Complete

## 2023-10-03 NOTE — Progress Notes (Addendum)
 Occupational Therapy Treatment Patient Details Name: Steve Alvarado MRN: [id number] DOB: 1955/09/16 Today's Date: 10/03/2023   History of present illness Steve Alvarado is a 68 yo male who presented from SNF with L heel pain. 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 Session completed with PT to safely address OOB and functional mobility. He completed bed mobility with mod A and cues and demonstrated improved unsupported sitting balance. He stood with max A +2 3x and was able to SP to the chair with max A +2. His cognition and command following were WFL this date, pt is now motivated to participate. Of note, on arrival pt had a pillow under his L knee and he was resting with it flexed. Reviewed importance of knee extension, pt left sitting in the chair with knee in extension and resting hand splint donned. OT to continue to follow acutely to facilitate progress towards established goals. Pt will continue to benefit from skilled inpatient follow up therapy, <3 hours/day - per chart review, pt's family is considering d/c home, he will need maximal DME and HH services.       If plan is discharge home, recommend the following:  A lot of help with walking and/or transfers;Two people to help with walking and/or transfers;A lot of help with bathing/dressing/bathroom;Two people to help with bathing/dressing/bathroom;Assistance with cooking/housework;Assistance with feeding;Direct supervision/assist for medications management;Direct supervision/assist for financial management;Help with stairs or ramp for entrance;Assist for transportation;Supervision due to cognitive status   Equipment Recommendations  BSC/3in1;Hospital bed;Hoyer lift;Wheelchair (measurements OT);Wheelchair cushion  (measurements OT)       Precautions / Restrictions Precautions Precautions: Fall Recall of Precautions/Restrictions: Intact Required Braces or Orthoses: Splint/Cast;Other Brace Splint/Cast: L resting hand splint, L limb protector Other Brace: L limb protector Restrictions Weight Bearing Restrictions Per Provider Order: Yes LLE Weight Bearing Per Provider Order: Non weight bearing Other Position/Activity Restrictions: wound vac       Mobility Bed Mobility Overal bed mobility: Needs Assistance Bed Mobility: Supine to Sit     Supine to sit: Mod assist, +2 for physical assistance     General bed mobility comments: with cues for sequencing    Transfers Overall transfer level: Needs assistance Equipment used: 2 person hand held assist Transfers: Sit to/from Stand, Bed to chair/wheelchair/BSC Sit to Stand: Max assist, +2 physical assistance, +2 safety/equipment Stand pivot transfers: Max assist, +2 safety/equipment, +2 physical assistance         General transfer comment: pt motivated this date. 3x STS with max A +2, 1x SP from bed to chair.     Balance Overall balance assessment: Needs assistance Sitting-balance support: Feet supported, No upper extremity supported Sitting balance-Leahy Scale: Fair Sitting balance - Comments: improved sitting balance this date   Standing balance support: Single extremity supported, During functional activity Standing balance-Leahy Scale: Zero               ADL either performed or assessed with clinical judgement   ADL Overall ADL's : Needs assistance/impaired Eating/Feeding: Set up;Sitting   Grooming: Set up;Sitting                   Toilet Transfer: Maximal assistance;+2 for physical assistance;+2 for safety/equipment;Stand-pivot Toilet Transfer Details (indicate cue type and reason): simulated SP from bed>chair  Functional mobility during ADLs: Maximal assistance;+2 for physical assistance;+2 for  safety/equipment General ADL Comments: session focused on OOB transfers as a precursor to ADLs    Extremity/Trunk Assessment Upper Extremity Assessment Upper Extremity Assessment: RUE deficits/detail;LUE deficits/detail RUE Deficits / Details: globally weak but functional RUE Sensation: WNL RUE Coordination: decreased gross motor LUE Deficits / Details: pt stating im paralyzed.  Painful and limited PROM at digits and wrist. no activation noted at elbow. Pt did not sleep in his resting hand splint, splint donned at the start of the session LUE Sensation: decreased light touch LUE Coordination: decreased fine motor;decreased gross motor   Lower Extremity Assessment Lower Extremity Assessment: Defer to PT evaluation        Vision   Vision Assessment?: No apparent visual deficits   Perception Perception Perception: Not tested   Praxis Praxis Praxis: Not tested   Communication Communication Communication: No apparent difficulties   Cognition Arousal: Alert Behavior During Therapy: WFL for tasks assessed/performed Cognition: No apparent impairments             OT - Cognition Comments: pt alert, pleasant and participatory this session. He stated I am in sound mind now. Pt able to follow all functional commands to complete transfers and exercises                 Following commands: Intact        Cueing   Cueing Techniques: Verbal cues        General Comments VSS    Pertinent Vitals/ Pain       Pain Assessment Pain Assessment: Faces Faces Pain Scale: Hurts a little bit Pain Location: LLE Pain Descriptors / Indicators: Guarding Pain Intervention(s): Limited activity within patient's tolerance, Monitored during session   Frequency  Min 2X/week        Progress Toward Goals  OT Goals(current goals can now be found in the care plan section)  Progress towards OT goals: Progressing toward goals  Acute Rehab OT Goals Patient Stated Goal: to get  better OT Goal Formulation: With patient Time For Goal Achievement: 10/11/23 Potential to Achieve Goals: Good ADL Goals Pt Will Perform Upper Body Dressing: with set-up;sitting Pt Will Perform Lower Body Dressing: with max assist;sitting/lateral leans Additional ADL Goal #1: Pt will complete bed mobility with min A as a precursor to ADLs  Plan      Co-evaluation    PT/OT/SLP Co-Evaluation/Treatment: Yes Reason for Co-Treatment: Complexity of the patient's impairments (multi-system involvement);For patient/therapist safety;To address functional/ADL transfers   OT goals addressed during session: ADL's and self-care      AM-PAC OT 6 Clicks Daily Activity     Outcome Measure   Help from another person eating meals?: A Little Help from another person taking care of personal grooming?: A Little Help from another person toileting, which includes using toliet, bedpan, or urinal?: Total Help from another person bathing (including washing, rinsing, drying)?: A Lot Help from another person to put on and taking off regular upper body clothing?: A Lot Help from another person to put on and taking off regular lower body clothing?: Total 6 Click Score: 12    End of Session Equipment Utilized During Treatment: Gait belt  OT Visit Diagnosis: Unsteadiness on feet (R26.81);Other abnormalities of gait and mobility (R26.89);Muscle weakness (generalized) (M62.81);Pain   Activity Tolerance Patient tolerated treatment well   Patient Left in chair;with call bell/phone within reach   Nurse Communication Mobility status        Time: 9174-9146 OT  Time Calculation (min): 28 min  Charges: OT General Charges $OT Visit: 1 Visit OT Treatments $Therapeutic Activity: 8-22 mins  Lucie Kendall, OTR/L Acute Rehabilitation Services Office [phone] Secure Chat Communication Preferred   Lucie JONETTA Kendall 10/03/2023, 9:13 AM

## 2023-10-03 NOTE — Progress Notes (Addendum)
 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  Pain started since last 3 weeks and also develop  into open wound.  On presentation, lab work showed WBC count of 11.1, creatinine 1.2, ESR of 106.  X-ray of the foot showed subcutaneous gas concerning for possible osteo.  MRI  showed posterior calcaneal osteomyelitis.  Started on broad-spectrum antibiotics, cultures sent.  S/p  left below-knee amputation.  PT/OT evaluation done, recommended acute inpatient rehab but now the plan is for skilled nursing facility.  Medically stable for discharge whenever possible  Assessment & Plan:  Principal Problem:   Acute osteomyelitis of calcaneum, unspecified laterality (HCC) Active Problems:   Osteomyelitis of left foot (HCC)   Gangrene of left foot (HCC)   Pressure injury of left heel, stage 4 (HCC)  Osteomyelitis of the left calcaneus: Presented with left foot pain, open wound.  Inflammatory  markers were elevated.  X-ray/MRI suggestive of osteomyelitis.  Dr. Harden consulted.S/P left below-knee amputation.  Antibiotics stopped.  PT/OT commending acute inpatient rehab.  Recommendation changed to SNF  Hypertension:  On amlodipine , Coreg .  Restarted. continue as needed medications for severe hypertension.  Dose of Coreg  increased.  History of CVA: On aspirin , Plavix  at home.  He has residual hemiparesis on the left.  He is mostly nonambulatory.  Lives in nursing facility  Hypothyroidism: Continue levothyroxine   AKI on CKD stage IIIa: Baseline creatinine from 1.5-1.8.  Currently kidney function at baseline  Chronic pain syndrome: On Lyrica .   Normocytic anemia: Likely from acute blood loss from amputation.   Currently hemoglobin stable in the range of 8.  Continue to monitor Hb intermittently  HFpEF: Last echo done August 2023 showed EF of 55 to 60%, normal is mostly abnormality, grade 1 diastolic dysfunction.  Currently he is euvolemic.  On Entresto   Insomnia: On trazodone   Drowsiness: You became somnolent on 7/11.  This morning he remains fully alert and oriented.  Confusion was likely from polypharmacy.   Baclofen  on hold.  ABG did not show any hypercarbia.  CT scan showed only chronic changes.  Constipation: Continue bowel regimen.     Pressure Injury 06/06/23 Buttocks Right Stage 1 -  Intact skin with non-blanchable redness of a localized area usually over a bony prominence. stage 2 (Active)  06/06/23 1600  Location: Buttocks  Location Orientation: Right  Staging: Stage 1 -  Intact skin with non-blanchable redness of a localized area usually over a bony prominence.  Wound Description (Comments): stage 2  DO NOT USE:  Present on Admission: Yes  Dressing Type Foam - Lift dressing to assess site every shift 10/02/23 1950    DVT prophylaxis:heparin  injection 5,000 Units Start: 09/28/23 1400 SCD's Start: 09/27/23 1237 SCDs Start: 09/25/23 1852     Code Status: Full Code  Family Communication: Discussed with daughter  [name] on phone on 7/12  Patient status:Inpatient  Patient is from: SNF  Anticipated discharge to: SNF  Estimated DC date:whenever  possible   Consultants: Orthopedics  Procedures: BKA  Antimicrobials:  Anti-infectives (From admission, onward)    Start     Dose/Rate Route Frequency Ordered Stop   09/27/23 1330  ceFEPIme  (MAXIPIME ) 2 g in sodium chloride  0.9 % 100 mL IVPB  2 g 200 mL/hr over 30 Minutes Intravenous Every 12 hours 09/27/23 0720 09/28/23 0719   09/27/23 1300  ceFAZolin  (ANCEF ) IVPB 2g/100 mL premix        2 g 200 mL/hr over 30 Minutes Intravenous On call to O.R. 09/27/23 0702 09/27/23 0950   09/27/23 1000  vancomycin  (VANCOREADY) IVPB  1500 mg/300 mL        1,500 mg 150 mL/hr over 120 Minutes Intravenous Every 24 hours 09/27/23 0720 09/28/23 1000   09/26/23 1000  vancomycin  (VANCOREADY) IVPB 2000 mg/400 mL  Status:  Discontinued        2,000 mg 200 mL/hr over 120 Minutes Intravenous Every 24 hours 09/26/23 0844 09/27/23 0720   09/26/23 0930  ceFEPIme  (MAXIPIME ) 2 g in sodium chloride  0.9 % 100 mL IVPB  Status:  Discontinued        2 g 200 mL/hr over 30 Minutes Intravenous Every 8 hours 09/26/23 0844 09/27/23 0720   09/26/23 0915  metroNIDAZOLE  (FLAGYL ) IVPB 500 mg        500 mg 100 mL/hr over 60 Minutes Intravenous Every 12 hours 09/26/23 0829 09/28/23 1105   09/25/23 1500  Ampicillin -Sulbactam (UNASYN ) 3 g in sodium chloride  0.9 % 100 mL IVPB       Placed in And Linked Group   3 g 200 mL/hr over 30 Minutes Intravenous  Once 09/25/23 1445 09/25/23 1610   09/25/23 1500  linezolid  (ZYVOX ) tablet 600 mg       Placed in And Linked Group   600 mg Oral  Once 09/25/23 1445 09/25/23 1535       Subjective: Patient seen and examined at bedside today.  Hemodynamically stable.  Comfortably sitting on the chair.  Alert and oriented.  About to work with the physical therapist.  No new complaints.  Objective: Vitals:   10/02/23 1518 10/02/23 2103 10/03/23 0400 10/03/23 0907  BP: (!) 154/72 (!) 149/71 138/61 (!) 141/65  Pulse: 71 71 69 70  Resp: 18 17 16 16   Temp: 98.3 F (36.8 C) 98 F (36.7 C) 98.1 F (36.7 C) 98 F (36.7 C)  TempSrc: Oral Oral Oral   SpO2: 98% 100% 98% 100%  Weight:      Height:        Intake/Output Summary (Last 24 hours) at 10/03/2023 1018 Last data filed at 10/03/2023 0900 Gross per 24 hour  Intake --  Output 1300 ml  Net -1300 ml   Filed Weights   09/25/23 2302  Weight: 101.4 kg    Examination:  General exam: Overall comfortable, not in distress HEENT: PERRL Respiratory system:  no wheezes or crackles  Cardiovascular system: S1 & S2 heard, RRR.  Gastrointestinal system: Abdomen  is nondistended, soft and nontender. Central nervous system: Alert and oriented, left residual hemiparesis Extremities: No edema, no clubbing ,no cyanosis, left BKA, wound VAC Skin: No rashes, no ulcers,no icterus     Data Reviewed: I have personally reviewed following labs and imaging studies  CBC: Recent Labs  Lab 09/27/23 0522 09/29/23 1037 10/03/23 0610  WBC 9.6  9.4 11.7* 7.1  NEUTROABS 7.5  --   --   HGB 10.7*  10.7* 9.3* 8.3*  HCT 33.0*  33.3* 28.6* 24.7*  MCV 88.7  89.8 88.0 87.0  PLT 307  295 322 326   Basic Metabolic Panel: Recent Labs  Lab 09/27/23 0522 09/28/23 0733 09/29/23 0652 09/30/23 0656 10/03/23 0610  NA 131* 136 137 139 134*  K 4.2 4.6 4.5 4.4 4.0  CL 101 107 108 113* 104  CO2 19* 18* 17* 17* 18*  GLUCOSE 217* 190* 204* 151* 122*  BUN 44* 49* 51* 37* 23  CREATININE 1.87* 2.29* 2.08* 1.34* 1.24  CALCIUM  7.9* 8.4* 8.5* 8.6* 8.6*     Recent Results (from the past 240 hours)  Blood Cultures x 2 sites     Status: None   Collection Time: 09/25/23  3:15 PM   Specimen: BLOOD  Result Value Ref Range Status   Specimen Description   Final    BLOOD SITE NOT SPECIFIED Performed at [facility], [address], [place], [place] [address]    Special Requests   Final    BOTTLES DRAWN AEROBIC AND ANAEROBIC Blood Culture adequate volume Performed at [facility], [address] [place], [place] [address]    Culture   Final    NO GROWTH 5 DAYS Performed at [facility], [address] [place], [place] [address]    Report Status 09/30/2023 FINAL  Final  Blood Cultures x 2 sites     Status: None   Collection Time: 09/25/23 11:07 PM   Specimen: BLOOD  Result Value Ref Range Status   Specimen Description BLOOD SITE NOT SPECIFIED  Final   Special Requests   Final    BOTTLES DRAWN AEROBIC AND ANAEROBIC Blood Culture results may not be optimal due to an inadequate volume of blood received in culture bottles    Culture   Final    NO GROWTH 5 DAYS Performed at [facility], [address], [place], [place] [address]    Report Status 09/30/2023 FINAL  Final  MRSA Next Gen by PCR, Nasal     Status: None   Collection Time: 09/26/23  8:02 AM   Specimen: Nasal Mucosa; Nasal Swab  Result Value Ref Range Status   MRSA by PCR Next Gen NOT DETECTED NOT DETECTED Final    Comment: (NOTE) The GeneXpert MRSA Assay (FDA approved for NASAL specimens only), is one component of a comprehensive MRSA colonization surveillance program. It is not intended to diagnose MRSA infection nor to guide or monitor treatment for MRSA infections. Test performance is not FDA approved in patients less than 83 years old. Performed at [facility], [address] [place], [place] [address]      Radiology Studies: No results found.    Scheduled Meds:  amLODipine   10 mg Oral q morning   aspirin  EC  81 mg Oral Daily   atorvastatin   40 mg Oral QPM   carvedilol   12.5 mg Oral BID WC   clopidogrel   75 mg Oral Daily   docusate sodium   100 mg Oral Daily   DULoxetine   60 mg Oral Daily   heparin  injection (subcutaneous)  5,000 Units Subcutaneous Q8H   insulin  aspart  0-24 Units Subcutaneous TID WC   levothyroxine   75 mcg Oral QHS   pantoprazole   40 mg Oral Daily   pneumococcal 20-valent conjugate vaccine  0.5 mL Intramuscular Tomorrow-1000   polyethylene glycol  17 g Oral BID   pregabalin   75 mg Oral BID   sacubitril -valsartan   1 tablet Oral BID   senna-docusate  1 tablet Oral BID   sodium bicarbonate   650 mg Oral TID   sodium chloride  flush  3 mL Intravenous Q12H   Continuous Infusions:     LOS: 8 days   Ivonne Mustache, MD [facility] P7/15/2025, 10:18 AM

## 2023-10-03 NOTE — Progress Notes (Signed)
 [facility]   S/P left BKA   Vac with good suction pending CIR admission verses HH verses SNF.  Vac csn be maintained for 7-10 days.  If he goes home verses SNF a Prevena vac will be exchanged prior to discharge.  Good suction for Prevena transition at discharge.  F/U with Dr. Harden 1 week from surgery.    Maurilio Deland Collet PA-C  [phone]

## 2023-10-03 NOTE — Progress Notes (Signed)
 Occupational Therapy Treatment Patient Details Name: Steve Alvarado MRN: [id number] DOB: 1956-02-22 Today's Date: 10/03/2023   History of present illness Steve Alvarado is a 68 yo male who presented from SNF with L heel pain. 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 Pt transferred towards the right to allow him to pivot towards right side on RLE. Required Max Ax2 to power up although total Ax2 required to complete pivot. Pt educated on sequencing through transfer although did not attempt to complete. Increased physical assist required to complete bed mobility especially when rolling to the right d/t lack of functional use of LUE. Discharge plan remains appropriate. Patient will benefit from continued inpatient follow up therapy, <3 hours/day.       If plan is discharge home, recommend the following:  Two people to help with walking and/or transfers;Two people to help with bathing/dressing/bathroom;Assistance with cooking/housework;Help with stairs or ramp for entrance;Assist for transportation   Equipment Recommendations  BSC/3in1;Hospital bed;Hoyer lift;Wheelchair (measurements OT);Wheelchair cushion (measurements OT)       Precautions / Restrictions Precautions Precautions: Fall Recall of Precautions/Restrictions: Intact Required Braces or Orthoses: Splint/Cast;Other Brace Splint/Cast: L resting hand splint, L limb protector Restrictions Weight Bearing Restrictions Per Provider Order: Yes LLE Weight Bearing Per Provider Order: Non weight bearing       Mobility Bed Mobility Overal bed mobility: Needs Assistance Bed Mobility: Sit to Supine Rolling: Max assist, Used rails     Sit to supine: Mod assist   General bed mobility comments: Pt required VC  for sequencing. Required max assist to roll towards right d/t lack of LUE use. Unable to scoot hips back towards middle of bed and bed pads were used to provide total assist.    Transfers Overall transfer level: Needs assistance Equipment used: 2 person hand held assist Transfers: Sit to/from Stand, Bed to chair/wheelchair/BSC Sit to Stand: Max assist, +2 physical assistance, +2 safety/equipment   Squat pivot transfers: +2 safety/equipment, +2 physical assistance, Total assist       General transfer comment: Pt unable to pivot towards right side to bed on RLE. Required Max A to power up from recliner and total assist to pivot towards bed. VC provided for sequencing and safety.     Balance Overall balance assessment: Needs assistance  Standing balance support: Single extremity supported, During functional activity Standing balance-Leahy Scale: Zero     ADL either performed or assessed with clinical judgement              Communication Communication Communication: No apparent difficulties   Cognition Arousal: Alert Behavior During Therapy: WFL for tasks assessed/performed Cognition: No apparent impairments    Following commands: Intact                 General Comments Pillow placed at distal end of residual limb once in bed. Pt educated to work on knee extension while in bed.    Pertinent Vitals/ Pain       Pain Assessment Pain Assessment: Faces Faces Pain Scale: Hurts even more Pain Location: scrotum during transfer Pain Descriptors / Indicators: Grimacing, Aching Pain Intervention(s): Monitored during session, Repositioned         Frequency  Min 2X/week        Progress Toward Goals  OT Goals(current goals can now be found  in the care plan section)  Progress towards OT goals: Progressing toward goals  Acute Rehab OT Goals Patient Stated Goal: to get better OT Goal Formulation: With patient Time For Goal Achievement: 10/11/23 Potential to Achieve  Goals: Good ADL Goals Pt Will Perform Upper Body Dressing: with set-up;sitting Pt Will Perform Lower Body Dressing: with max assist;sitting/lateral leans Additional ADL Goal #1: Pt will complete bed mobility with min A as a precursor to ADLs  Plan      Co-evaluation    PT/OT/SLP Co-Evaluation/Treatment: Yes Reason for Co-Treatment: Complexity of the patient's impairments (multi-system involvement);For patient/therapist safety;To address functional/ADL transfers PT goals addressed during session: Mobility/safety with mobility;Balance;Proper use of DME;Strengthening/ROM OT goals addressed during session: ADL's and self-care      AM-PAC OT 6 Clicks Daily Activity     Outcome Measure   Help from another person eating meals?: A Little Help from another person taking care of personal grooming?: A Little Help from another person toileting, which includes using toliet, bedpan, or urinal?: Total Help from another person bathing (including washing, rinsing, drying)?: A Lot Help from another person to put on and taking off regular upper body clothing?: A Lot Help from another person to put on and taking off regular lower body clothing?: Total 6 Click Score: 12    End of Session Equipment Utilized During Treatment: Gait belt  OT Visit Diagnosis: Unsteadiness on feet (R26.81);Other abnormalities of gait and mobility (R26.89);Muscle weakness (generalized) (M62.81)   Activity Tolerance Patient tolerated treatment well   Patient Left in bed;with call bell/phone within reach;with bed alarm set;with nursing/sitter in room   Nurse Communication Mobility status        Time: 9042-8991 OT Time Calculation (min): 11 min  Charges: OT General Charges $OT Visit: 1 Visit OT Treatments $Self Care/Home Management : 8-22 mins $Therapeutic Activity: 8-22 mins  Leita Howell, OTR/L,CBIS  Supplemental OT - [facility] and [facility] Secure Chat Preferred    Steve Alvarado, Leita BIRCH 10/03/2023, 10:15 AM

## 2023-10-04 DIAGNOSIS — M86179 Other acute osteomyelitis, unspecified ankle and foot: Secondary | ICD-10-CM | POA: Diagnosis not present

## 2023-10-04 LAB — GLUCOSE, CAPILLARY
Glucose-Capillary: 148 mg/dL — ABNORMAL HIGH (ref 70–99)
Glucose-Capillary: 150 mg/dL — ABNORMAL HIGH (ref 70–99)
Glucose-Capillary: 138 mg/dL — ABNORMAL HIGH (ref 70–99)

## 2023-10-04 NOTE — Progress Notes (Signed)
 Physical Therapy Treatment Patient Details Name: Steve Alvarado MRN: [id number] DOB: 1956-01-18 Today's Date: 10/04/2023   History of Present Illness Steve Alvarado is a 68 yo male who presented from SNF with L heel pain. 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 Session focused on progressing functional transfers to maximize functional independence. Pt needing max A to laterally scoot from elevated EOB to drop arm wheelchair with sliding board with use of bed pad, pt giving good effort however limited by global weakness. Pt needing max A to place sliding board as pt with poor seated balance and trunk control to lean to L to place. Pt performing self care tasks in bathroom from wheelchair level with assist for set up due to L hemi-weakness. Pt able to come to partial standing from wheelchair with max A in stedy frame however unable to achieve full upright standing due to RLE weakness. Pt continues to benefit from skilled PT services to progress toward functional mobility goals.      If plan is discharge home, recommend the following: Two people to help with walking and/or transfers;Two people to help with bathing/dressing/bathroom;Assistance with cooking/housework;Assist for transportation;Help with stairs or ramp for entrance   Can travel by private vehicle        Equipment Recommendations  Other (comment) (if going home will need hospital bed, wheel chair, hoyer)    Recommendations for Other Services       Precautions / Restrictions Precautions Precautions: Fall Recall of Precautions/Restrictions: Intact Required Braces or Orthoses: Splint/Cast;Other Brace Splint/Cast: L resting hand splint, L limb protector Other Brace: L limb  protector Restrictions Weight Bearing Restrictions Per Provider Order: Yes LLE Weight Bearing Per Provider Order: Non weight bearing Other Position/Activity Restrictions: wound vac     Mobility  Bed Mobility Overal bed mobility: Needs Assistance Bed Mobility: Supine to Sit     Supine to sit: Mod assist     General bed mobility comments: mod A to elevate trunk and scoot out to EOB with bed pad use    Transfers Overall transfer level: Needs assistance Equipment used: Ambulation equipment used, Sliding board Transfers: Sit to/from Stand, Bed to chair/wheelchair/BSC Sit to Stand: Max assist, +2 physical assistance, +2 safety/equipment, Via lift equipment          Lateral/Scoot Transfers: Max assist, With slide board, From elevated surface General transfer comment: max A to laterally scoot from elevated EOB with sliding board to drop arm wheelchair, max cues for head/hips relationship and bed pad utilized to scoot, poor use of RUE and RLE to assist, attempting to stand from wheelchair at end fo session with stedy and pt able to clear hips x3 but unable to achieve uprght Transfer via Lift Equipment: Stedy  Ambulation/Gait               General Gait Details: unable at baseline   Stairs             Wheelchair Mobility     Tilt Bed    Modified Rankin (Stroke Patients Only)       Balance Overall balance assessment: Needs assistance Sitting-balance support: Feet supported, No upper extremity supported Sitting balance-Leahy Scale: Fair Sitting balance - Comments: improved sitting balance this date Postural control: Right lateral lean Standing balance support: Single extremity supported,  During functional activity Standing balance-Leahy Scale: Zero                              Communication Communication Communication: No apparent difficulties Factors Affecting Communication: Reduced clarity of speech (doesn't have dentures in)  Cognition  Arousal: Alert Behavior During Therapy: WFL for tasks assessed/performed   PT - Cognitive impairments: Sequencing, Problem solving                       PT - Cognition Comments: mildly delayed today but much better than yesterday, alert and joking with therapist Following commands: Intact Following commands impaired: Follows multi-step commands with increased time    Cueing Cueing Techniques: Verbal cues  Exercises      General Comments General comments (skin integrity, edema, etc.): pt performing hygiene tasks in bathrrom at sink from w/c level      Pertinent Vitals/Pain Pain Assessment Pain Assessment: Faces Faces Pain Scale: Hurts little more Pain Location: scrotum during transfer Pain Descriptors / Indicators: Grimacing, Aching Pain Intervention(s): Monitored during session, Limited activity within patient's tolerance    Home Living                          Prior Function            PT Goals (current goals can now be found in the care plan section) Acute Rehab PT Goals PT Goal Formulation: With patient/family Time For Goal Achievement: 10/12/23 Progress towards PT goals: Progressing toward goals    Frequency    Min 2X/week      PT Plan      Co-evaluation              AM-PAC PT 6 Clicks Mobility   Outcome Measure  Help needed turning from your back to your side while in a flat bed without using bedrails?: A Lot Help needed moving from lying on your back to sitting on the side of a flat bed without using bedrails?: A Lot Help needed moving to and from a bed to a chair (including a wheelchair)?: Total Help needed standing up from a chair using your arms (e.g., wheelchair or bedside chair)?: Total Help needed to walk in hospital room?: Total Help needed climbing 3-5 steps with a railing? : Total 6 Click Score: 8    End of Session Equipment Utilized During Treatment: Gait belt;Other (comment) (L limb protector) Activity  Tolerance: Patient tolerated treatment well Patient left: in chair;with call bell/phone within reach;with chair alarm set (in wheelchair) Nurse Communication: Mobility status PT Visit Diagnosis: Muscle weakness (generalized) (M62.81);Pain;Other abnormalities of gait and mobility (R26.89) Pain - Right/Left: Left Pain - part of body: Leg     Time: 0829-0905 PT Time Calculation (min) (ACUTE ONLY): 36 min  Charges:    $Therapeutic Activity: 23-37 mins PT General Charges $$ ACUTE PT VISIT: 1 Visit                     Shafiq Larch R. PTA Acute Rehabilitation Services Office: [phone]   Therisa CHRISTELLA Boor 10/04/2023, 10:47 AM

## 2023-10-04 NOTE — TOC Progression Note (Addendum)
 Transition of Care [facility]) - Progression Note    Patient Details  Name: Steve Alvarado MRN: [id number] Date of Birth: 1955/04/05  Transition of Care [facility]) CM/SW Contact  Bridget Cordella Simmonds, LCSW Phone Number: 10/04/2023, 10:59 AM  Clinical Narrative:   CSW spoke with Ricky/[facility]. He will review the referral.  CSW, RNCM Jon, [facility] Supervisor Erminio Herring all met with pt in room.  Attempted to call sister Teia to include her but she did not answer.  Discussed with pt that he is stable for DC and we need to move forward with current SNF bed offer or DC home.  Pt states he would like to move forward with DC to [facility].  CSW confirmed with Rh[facility] that they can receive pt today.  Facility choice updated in [facility] auth request that was submitted Monday.   CSW received call back from sister 45.  CSW updated her, she asked questions about the transition from [place] back home but voiced agreement with moving forward with DC to [place].   1300: Insurance offering P2P, [phone] option 5 by 1430.  MD informed.   1450: [facility] [employer]: auth request has been denied.  Appeal: [phone].  CSW spoke with sister Rayetta, updated her, she will talk with pt daughter and call back.   Expected Discharge Plan: Skilled Nursing Facility Barriers to Discharge: Continued Medical Work up, SNF Pending bed offer  Expected Discharge Plan and Services In-house Referral: Clinical Social Work   Post Acute Care Choice: Skilled Nursing Facility Living arrangements for the past 2 months: Skilled Nursing Facility [facility])                                       Social Determinants of Health (SDOH) Interventions SDOH Screenings   Food Insecurity: No Food Insecurity (09/25/2023)  Housing: Low Risk  (09/25/2023)  Transportation Needs: No Transportation Needs (09/25/2023)  Utilities: Not At Risk (09/25/2023)  Depression (PHQ2-9): Low Risk  (12/31/2021)  Financial  Resource Strain: High Risk (10/26/2020)   Received from [facility]  Social Connections: Unknown (09/25/2023)  Tobacco Use: Low Risk  (09/27/2023)    Readmission Risk Interventions    06/07/2023   11:17 AM 11/08/2021   11:09 AM  Readmission Risk Prevention Plan  Transportation Screening Complete Complete  Medication Review [facility]) Complete Complete  PCP or Specialist appointment within 3-5 days of discharge Complete Complete  HRI or Home Care Consult Complete   SW Recovery Care/Counseling Consult Complete Complete  Palliative Care Screening Not Applicable Not Applicable  Skilled Nursing Facility Complete Complete

## 2023-10-04 NOTE — Progress Notes (Signed)
 Mobility Specialist Progress Note:    10/04/23 1128  Mobility  Activity Transferred from chair to bed (w/c)  Level of Assistance Maximum assist, patient does 25-49% (+2)  Assistive Device Sliding board;Stedy  LLE Weight Bearing Per Provider Order NWB  Activity Response Tolerated well  Mobility visit 1 Mobility  Mobility Specialist Start Time (ACUTE ONLY) 0932  Mobility Specialist Stop Time (ACUTE ONLY) 0946  Mobility Specialist Time Calculation (min) (ACUTE ONLY) 14 min   Pt received in w/c requesting assistance getting back to bed. Attempted to use stedy, despite MaxA+2 and x3 attempts pt was unable to clear buttocks from chair. Used sliding board to successfully transfer back to bed w/ ModA+2. Left in bed w/ call bell and personal belongings in reach. All needs met. Bed alarm on.  Thersia Minder Mobility Specialist  Please contact vis Secure Chat or  Rehab Office [phone]

## 2023-10-04 NOTE — Progress Notes (Signed)
 [facility]  Left BKA vac with good suction will continue.  No drainge in canister. No new complaints.  Pending discharge when medically stable.   If he goes home verses SNF a Prevena vac will be exchanged prior to discharge.   F/u OP with Dr. Harden in 1 week after discharge.    Maurilio Deland Collet PA-C  [phone]

## 2023-10-04 NOTE — Progress Notes (Signed)
 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  Pain started since last 3 weeks and also develop  into open wound.  On presentation, lab work showed WBC count of 11.1, creatinine 1.2, ESR of 106.  X-ray of the foot showed subcutaneous gas concerning for possible osteo.  MRI  showed posterior calcaneal osteomyelitis.  Started on broad-spectrum antibiotics, cultures sent.  S/p  left below-knee amputation.  PT/OT evaluation done, recommended acute inpatient rehab but now the plan is for skilled nursing facility.  Medically stable for discharge whenever possible  Assessment & Plan:  Principal Problem:   Acute osteomyelitis of calcaneum, unspecified laterality (HCC) Active Problems:   Osteomyelitis of left foot (HCC)   Gangrene of left foot (HCC)   Pressure injury of left heel, stage 4 (HCC)  Osteomyelitis of the left calcaneus: Presented with left foot pain, open wound.  Inflammatory  markers were elevated.  X-ray/MRI suggestive of osteomyelitis.  Dr. Harden consulted.S/P left below-knee amputation.  Antibiotics stopped.  PT/OT commending acute inpatient rehab.  Recommendation changed to SNF  Hypertension:  On amlodipine , Coreg .  Restarted. continue as needed medications for severe hypertension.  Dose of Coreg  increased.  History of CVA: On aspirin , Plavix  at home.  He has residual hemiparesis on the left.  He is mostly nonambulatory.  Lives in nursing facility  Hypothyroidism: Continue levothyroxine   AKI on CKD stage IIIa: Baseline creatinine from 1.5-1.8.  Currently kidney function at baseline  Chronic pain syndrome: On Lyrica .   Normocytic anemia: Likely from acute blood loss from amputation.   Currently hemoglobin stable in the range of 8.  Continue to monitor Hb intermittently  HFpEF: Last echo done August 2023 showed EF of 55 to 60%, normal is mostly abnormality, grade 1 diastolic dysfunction.  Currently he is euvolemic.  On Entresto   Insomnia: On trazodone   Drowsiness: You became somnolent on 7/11.  This morning he remains fully alert and oriented.  Confusion was likely from polypharmacy.   Baclofen  on hold.  ABG did not show any hypercarbia.  CT scan showed only chronic changes.  Constipation: Continue bowel regimen.     Pressure Injury 06/06/23 Buttocks Right Stage 1 -  Intact skin with non-blanchable redness of a localized area usually over a bony prominence. stage 2 (Active)  06/06/23 1600  Location: Buttocks  Location Orientation: Right  Staging: Stage 1 -  Intact skin with non-blanchable redness of a localized area usually over a bony prominence.  Wound Description (Comments): stage 2  DO NOT USE:  Present on Admission: Yes  Dressing Type Foam - Lift dressing to assess site every shift 10/03/23 2000    DVT prophylaxis:heparin  injection 5,000 Units Start: 09/28/23 1400 SCD's Start: 09/27/23 1237 SCDs Start: 09/25/23 1852     Code Status: Full Code  Family Communication: Discussed with daughter  [name] on phone on 7/12  Patient status:Inpatient  Patient is from: SNF  Anticipated discharge to: SNF  Estimated DC date:whenever  possible   Consultants: Orthopedics  Procedures: BKA  Antimicrobials:  Anti-infectives (From admission, onward)    Start     Dose/Rate Route Frequency Ordered Stop   09/27/23 1330  ceFEPIme  (MAXIPIME ) 2 g in sodium chloride  0.9 % 100 mL IVPB  2 g 200 mL/hr over 30 Minutes Intravenous Every 12 hours 09/27/23 0720 09/28/23 0719   09/27/23 1300  ceFAZolin  (ANCEF ) IVPB 2g/100 mL premix        2 g 200 mL/hr over 30 Minutes Intravenous On call to O.R. 09/27/23 0702 09/27/23 0950   09/27/23 1000  vancomycin  (VANCOREADY) IVPB  1500 mg/300 mL        1,500 mg 150 mL/hr over 120 Minutes Intravenous Every 24 hours 09/27/23 0720 09/28/23 1000   09/26/23 1000  vancomycin  (VANCOREADY) IVPB 2000 mg/400 mL  Status:  Discontinued        2,000 mg 200 mL/hr over 120 Minutes Intravenous Every 24 hours 09/26/23 0844 09/27/23 0720   09/26/23 0930  ceFEPIme  (MAXIPIME ) 2 g in sodium chloride  0.9 % 100 mL IVPB  Status:  Discontinued        2 g 200 mL/hr over 30 Minutes Intravenous Every 8 hours 09/26/23 0844 09/27/23 0720   09/26/23 0915  metroNIDAZOLE  (FLAGYL ) IVPB 500 mg        500 mg 100 mL/hr over 60 Minutes Intravenous Every 12 hours 09/26/23 0829 09/28/23 1105   09/25/23 1500  Ampicillin -Sulbactam (UNASYN ) 3 g in sodium chloride  0.9 % 100 mL IVPB       Placed in And Linked Group   3 g 200 mL/hr over 30 Minutes Intravenous  Once 09/25/23 1445 09/25/23 1610   09/25/23 1500  linezolid  (ZYVOX ) tablet 600 mg       Placed in And Linked Group   600 mg Oral  Once 09/25/23 1445 09/25/23 1535       Subjective: Patient seen and examined at bedside today.  Hemodynamically stable.  Very comfortable.  Sitting on the chair.  Eating his breakfast.  Pain well-controlled.  No new complaints  Objective: Vitals:   10/03/23 0907 10/03/23 1952 10/04/23 0525 10/04/23 0735  BP: (!) 141/65 139/70 133/69 (!) 142/71  Pulse: 70 66 66 63  Resp: 16 18 18 14   Temp: 98 F (36.7 C) 98.1 F (36.7 C) 98.2 F (36.8 C) 97.8 F (36.6 C)  TempSrc:  Oral Oral   SpO2: 100% 95% 96% 97%  Weight:      Height:        Intake/Output Summary (Last 24 hours) at 10/04/2023 1036 Last data filed at 10/03/2023 2100 Gross per 24 hour  Intake --  Output 600 ml  Net -600 ml   Filed Weights   09/25/23 2302  Weight: 101.4 kg    Examination:  General exam: Overall comfortable, not in distress, obese HEENT: PERRL Respiratory system:  no wheezes or crackles  Cardiovascular system: S1 & S2 heard, RRR.  Gastrointestinal system: Abdomen is  nondistended, soft and nontender. Central nervous system: Alert and oriented Extremities: No edema, no clubbing ,no cyanosis, left BKA, wound VAC Skin: No rashes, no ulcers,no icterus      Data Reviewed: I have personally reviewed following labs and imaging studies  CBC: Recent Labs  Lab 09/29/23 1037 10/03/23 0610  WBC 11.7* 7.1  HGB 9.3* 8.3*  HCT 28.6* 24.7*  MCV 88.0 87.0  PLT 322 326   Basic Metabolic Panel: Recent Labs  Lab 09/28/23 0733 09/29/23 0652 09/30/23 0656 10/03/23 0610  NA 136 137 139 134*  K 4.6 4.5 4.4 4.0  CL 107 108 113* 104  CO2 18* 17* 17* 18*  GLUCOSE 190* 204* 151* 122*  BUN 49* 51* 37* 23  CREATININE 2.29* 2.08* 1.34* 1.24  CALCIUM  8.4* 8.5* 8.6* 8.6*  Recent Results (from the past 240 hours)  Blood Cultures x 2 sites     Status: None   Collection Time: 09/25/23  3:15 PM   Specimen: BLOOD  Result Value Ref Range Status   Specimen Description   Final    BLOOD SITE NOT SPECIFIED Performed at [facility], [address] [place], [place] [address]    Special Requests   Final    BOTTLES DRAWN AEROBIC AND ANAEROBIC Blood Culture adequate volume Performed at [facility], [address] [place], [place] [address]    Culture   Final    NO GROWTH 5 DAYS Performed at [facility], [address] [place], [place] [address]    Report Status 09/30/2023 FINAL  Final  Blood Cultures x 2 sites     Status: None   Collection Time: 09/25/23 11:07 PM   Specimen: BLOOD  Result Value Ref Range Status   Specimen Description BLOOD SITE NOT SPECIFIED  Final   Special Requests   Final    BOTTLES DRAWN AEROBIC AND ANAEROBIC Blood Culture results may not be optimal due to an inadequate volume of blood received in culture bottles   Culture   Final    NO GROWTH 5 DAYS Performed at [facility], [address], [place], [place] [address]    Report Status 09/30/2023 FINAL  Final  MRSA Next Gen by PCR,  Nasal     Status: None   Collection Time: 09/26/23  8:02 AM   Specimen: Nasal Mucosa; Nasal Swab  Result Value Ref Range Status   MRSA by PCR Next Gen NOT DETECTED NOT DETECTED Final    Comment: (NOTE) The GeneXpert MRSA Assay (FDA approved for NASAL specimens only), is one component of a comprehensive MRSA colonization surveillance program. It is not intended to diagnose MRSA infection nor to guide or monitor treatment for MRSA infections. Test performance is not FDA approved in patients less than 70 years old. Performed at [facility], [address], [place], [place] [address]      Radiology Studies: No results found.    Scheduled Meds:  amLODipine   10 mg Oral q morning   aspirin  EC  81 mg Oral Daily   atorvastatin   40 mg Oral QPM   carvedilol   12.5 mg Oral BID WC   clopidogrel   75 mg Oral Daily   docusate sodium   100 mg Oral Daily   DULoxetine   60 mg Oral Daily   heparin  injection (subcutaneous)  5,000 Units Subcutaneous Q8H   insulin  aspart  0-24 Units Subcutaneous TID WC   levothyroxine   75 mcg Oral QHS   pantoprazole   40 mg Oral Daily   pneumococcal 20-valent conjugate vaccine  0.5 mL Intramuscular Tomorrow-1000   polyethylene glycol  17 g Oral BID   pregabalin   75 mg Oral BID   sacubitril -valsartan   1 tablet Oral BID   senna-docusate  1 tablet Oral BID   sodium bicarbonate   650 mg Oral TID   sodium chloride  flush  3 mL Intravenous Q12H   Continuous Infusions:     LOS: 9 days   Ivonne Mustache, MD [facility] P7/16/2025, 10:36 AM

## 2023-10-05 DIAGNOSIS — M86179 Other acute osteomyelitis, unspecified ankle and foot: Secondary | ICD-10-CM | POA: Diagnosis not present

## 2023-10-05 LAB — GLUCOSE, CAPILLARY
Glucose-Capillary: 136 mg/dL — ABNORMAL HIGH (ref 70–99)
Glucose-Capillary: 137 mg/dL — ABNORMAL HIGH (ref 70–99)
Glucose-Capillary: 141 mg/dL — ABNORMAL HIGH (ref 70–99)
Glucose-Capillary: 147 mg/dL — ABNORMAL HIGH (ref 70–99)
Glucose-Capillary: 172 mg/dL — ABNORMAL HIGH (ref 70–99)

## 2023-10-05 NOTE — Progress Notes (Signed)
 Patient ID: Steve Alvarado, male   DOB: 12-29-1955, 68 y.o.   MRN: [id number] Patient is here for follow-up post left below-knee amputation.  There is no drainage in the wound VAC canister there is a good suction fit.  Patient states he is now planning for discharge to skilled nursing.  Plan to remove the wound VAC dressing at time of discharge.

## 2023-10-05 NOTE — Progress Notes (Signed)
 Occupational Therapy Treatment Patient Details Name: Steve Alvarado MRN: [id number] DOB: Jun 08, 1955 Today's Date: 10/05/2023   History of present illness Steve Alvarado is a 68 yo male who presented from SNF with L heel pain. 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 Agreeable to participate in OT session. Plan to focus session on bed mobility and functional transfers while using SB to progress OOB activity. Pt demonstrated resistance to intervention when OT provided education for sequencing and technique during bed mobility and transfers. Pt attempted to direct care although lack of insight, problem solving, organization, and safety awareness demonstrated. Session terminated early d/t pt's resistance towards therapy intervention. Patient will benefit from continued inpatient follow up therapy, <3 hours/day.       If plan is discharge home, recommend the following:   (total assist care needed)   Equipment Recommendations  BSC/3in1;Hospital bed;Hoyer lift;Wheelchair (measurements OT);Wheelchair cushion (measurements OT)       Precautions / Restrictions Precautions Precautions: Fall Recall of Precautions/Restrictions: Impaired Required Braces or Orthoses: Splint/Cast;Other Brace Splint/Cast: L resting hand splint, L limb protector Restrictions Weight Bearing Restrictions Per Provider Order: Yes LLE Weight Bearing Per Provider Order: Non weight bearing Other Position/Activity Restrictions: wound vac       Mobility Bed Mobility Overal bed mobility: Needs Assistance Bed Mobility: Supine to Sit, Sit to Supine Rolling: Max assist, Used rails   Supine to sit: Max assist, HOB elevated, +2 for physical assistance, +2 for safety/equipment Sit to supine: Max assist   General bed mobility  comments: VC for technique in order to transition from supine to sit. Pt declined carry over of education and only sat on EOB when OT pulled pt's trunk upright off HOB. Pt transitioned from sit to supine without assist although vertical across bedas he refused to attempt lateral scoot transfer. Once supine vertically, 2 person assist was required to correct. Bed pad and bed placed in trendelenburg to boost pt upright in bed.    Transfers     General transfer comment: Total assist need to lateral lean to the left for SB placement. Pt unable to lateral scoot when cued. Decreased safety awareness and reasoning noted when pt asked for bed to be raised above recliner. Difficulty with organizing and problem solving when attempting transfer. Transfer attempt terminated.     Balance Overall balance assessment: Needs assistance Sitting-balance support: Feet supported, No upper extremity supported Sitting balance-Leahy Scale: Fair       ADL either performed or assessed with clinical judgement   ADL    Lower Body Dressing: Total assistance;Bed level (to don limb protector and shrinker)                 Communication Communication Communication: No apparent difficulties   Cognition Arousal: Alert Behavior During Therapy: Flat affect    OT - Cognition Comments: Resistent to education. Attempting to give staff direction on care although not functional.    Following commands: Impaired Following commands impaired: Follows one step commands inconsistently      Cueing   Cueing Techniques: Verbal cues, Visual cues, Tactile cues             Pertinent Vitals/ Pain       Pain Assessment Pain Assessment: Faces Faces Pain Scale: Hurts even more Pain Location: scrotum during seated weight shifting Pain Descriptors /  Indicators: Grimacing, Aching, Guarding Pain Intervention(s): Limited activity within patient's tolerance         Frequency  Min 2X/week        Progress Toward  Goals  OT Goals(current goals can now be found in the care plan section)  Progress towards OT goals: Not progressing toward goals - comment (pt self limiting)            AM-PAC OT 6 Clicks Daily Activity     Outcome Measure   Help from another person eating meals?: A Little Help from another person taking care of personal grooming?: A Lot Help from another person toileting, which includes using toliet, bedpan, or urinal?: Total Help from another person bathing (including washing, rinsing, drying)?: Total Help from another person to put on and taking off regular upper body clothing?: A Lot Help from another person to put on and taking off regular lower body clothing?: Total 6 Click Score: 10    End of Session    OT Visit Diagnosis: Unsteadiness on feet (R26.81);Other abnormalities of gait and mobility (R26.89);Muscle weakness (generalized) (M62.81)   Activity Tolerance Treatment limited secondary to agitation   Patient Left in bed;with call bell/phone within reach;with bed alarm set   Nurse Communication Patient requests pain meds        Time: 8591-8559 OT Time Calculation (min): 32 min  Charges: OT General Charges $OT Visit: 1 Visit OT Treatments $Self Care/Home Management : 23-37 mins  Leita Howell, OTR/L,CBIS  Supplemental OT - [facility] and [facility] Secure Chat Preferred    Litisha Guagliardo, Leita BIRCH 10/05/2023, 9:08 PM

## 2023-10-05 NOTE — Plan of Care (Signed)
  Problem: Education: Goal: Knowledge of General Education information will improve Description: Including pain rating scale, medication(s)/side effects and non-pharmacologic comfort measures Outcome: Progressing   Problem: Health Behavior/Discharge Planning: Goal: Ability to manage health-related needs will improve Outcome: Progressing   Problem: Clinical Measurements: Goal: Ability to maintain clinical measurements within normal limits will improve Outcome: Progressing Goal: Will remain free from infection Outcome: Progressing Goal: Diagnostic test results will improve Outcome: Progressing Goal: Respiratory complications will improve Outcome: Progressing Goal: Cardiovascular complication will be avoided Outcome: Progressing   Problem: Activity: Goal: Risk for activity intolerance will decrease Outcome: Progressing   Problem: Nutrition: Goal: Adequate nutrition will be maintained Outcome: Progressing   Problem: Coping: Goal: Level of anxiety will decrease Outcome: Progressing   Problem: Pain Managment: Goal: General experience of comfort will improve and/or be controlled Outcome: Progressing   Problem: Safety: Goal: Ability to remain free from injury will improve Outcome: Progressing   Problem: Skin Integrity: Goal: Risk for impaired skin integrity will decrease Outcome: Progressing   Problem: Activity: Goal: Ability to perform//tolerate increased activity and mobilize with assistive devices will improve Outcome: Progressing   Problem: Clinical Measurements: Goal: Postoperative complications will be avoided or minimized Outcome: Progressing   Problem: Self-Care: Goal: Ability to meet self-care needs will improve Outcome: Progressing   Problem: Self-Concept: Goal: Ability to maintain and perform role responsibilities to the fullest extent possible will improve Outcome: Progressing   Problem: Pain Management: Goal: Pain level will decrease with appropriate  interventions Outcome: Progressing   Problem: Skin Integrity: Goal: Demonstration of wound healing without infection will improve Outcome: Progressing

## 2023-10-05 NOTE — Progress Notes (Signed)
 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  Pain started since last 3 weeks and also develop  into open wound.  On presentation, lab work showed WBC count of 11.1, creatinine 1.2, ESR of 106.  X-ray of the foot showed subcutaneous gas concerning for possible osteo.  MRI  showed posterior calcaneal osteomyelitis.  Started on broad-spectrum antibiotics, cultures sent.  S/p  left below-knee amputation.  PT/OT evaluation done, recommended acute inpatient rehab but now the plan is for skilled nursing facility.  SNF declined by his insurance after doing peer to peer.  He was at long-term care in another facility where he does not want to go again.  Patient still wants to go to another SNF.  TOC following.  Medically stable for discharge whenever possible  Assessment & Plan:  Principal Problem:   Acute osteomyelitis of calcaneum, unspecified laterality (HCC) Active Problems:   Osteomyelitis of left foot (HCC)   Gangrene of left foot (HCC)   Pressure injury of left heel, stage 4 (HCC)  Osteomyelitis of the left calcaneus: Presented with left foot pain, open wound.  Inflammatory  markers were elevated.  X-ray/MRI suggestive of osteomyelitis.  Dr. Harden consulted.S/P left below-knee amputation.  Antibiotics stopped.  PT/OT commending acute inpatient rehab.  Recommendation changed to SNF now.  Pending disposition.  Plan to remove wound VAC dressing at the time of discharge.  He needs to follow-up with Dr. Harden as an outpatient  Hypertension:  On amlodipine , Coreg .  Restarted. continue as needed medications for severe hypertension.  Dose of Coreg  increased.  History of CVA: On aspirin , Plavix  at home.  He has  residual hemiparesis on the left.  He is mostly nonambulatory.  Lives in nursing facility  Hypothyroidism: Continue levothyroxine   AKI on CKD stage IIIa: Baseline creatinine from 1.5-1.8.  Currently kidney function at baseline  Chronic pain syndrome: On Lyrica .   Normocytic anemia: Likely from acute blood loss from amputation.  Currently hemoglobin stable in the range of 8.  Continue to monitor Hb intermittently  HFpEF: Last echo done August 2023 showed EF of 55 to 60%, normal is mostly abnormality, grade 1 diastolic dysfunction.  Currently he is euvolemic.  On Entresto   Insomnia: On trazodone   Drowsiness: You became somnolent on 7/11.  Now  fully alert and oriented.  Confusion was likely from polypharmacy.   Baclofen  on hold.  ABG did not show any hypercarbia.  CT scan of head showed only chronic changes.  Minimal narcotics as much as possible  Constipation: Continue bowel regimen.     Pressure Injury 06/06/23 Buttocks Right Stage 1 -  Intact skin with non-blanchable redness of a localized area usually over a bony prominence. stage 2 (Active)  06/06/23 1600  Location: Buttocks  Location Orientation: Right  Staging: Stage 1 -  Intact skin with non-blanchable redness of a localized area usually over a bony prominence.  Wound Description (Comments): stage 2  DO NOT USE:  Present on Admission: Yes  Dressing Type Foam - Lift dressing to assess site every shift 10/04/23 2300    DVT prophylaxis:heparin  injection 5,000 Units Start: 09/28/23 1400 SCD's Start: 09/27/23 1237 SCDs Start: 09/25/23 1852     Code Status: Full Code  Family Communication: Discussed with daughter  [name] on phone on 7/12  Patient status:Inpatient  Patient is from: SNF  Anticipated discharge to: SNF  Estimated DC date:whenever  possible   Consultants: Orthopedics  Procedures: BKA  Antimicrobials:  Anti-infectives (From admission, onward)    Start     Dose/Rate Route Frequency Ordered Stop    09/27/23 1330  ceFEPIme  (MAXIPIME ) 2 g in sodium chloride  0.9 % 100 mL IVPB        2 g 200 mL/hr over 30 Minutes Intravenous Every 12 hours 09/27/23 0720 09/28/23 0719   09/27/23 1300  ceFAZolin  (ANCEF ) IVPB 2g/100 mL premix        2 g 200 mL/hr over 30 Minutes Intravenous On call to O.R. 09/27/23 0702 09/27/23 0950   09/27/23 1000  vancomycin  (VANCOREADY) IVPB 1500 mg/300 mL        1,500 mg 150 mL/hr over 120 Minutes Intravenous Every 24 hours 09/27/23 0720 09/28/23 1000   09/26/23 1000  vancomycin  (VANCOREADY) IVPB 2000 mg/400 mL  Status:  Discontinued        2,000 mg 200 mL/hr over 120 Minutes Intravenous Every 24 hours 09/26/23 0844 09/27/23 0720   09/26/23 0930  ceFEPIme  (MAXIPIME ) 2 g in sodium chloride  0.9 % 100 mL IVPB  Status:  Discontinued        2 g 200 mL/hr over 30 Minutes Intravenous Every 8 hours 09/26/23 0844 09/27/23 0720   09/26/23 0915  metroNIDAZOLE  (FLAGYL ) IVPB 500 mg        500 mg 100 mL/hr over 60 Minutes Intravenous Every 12 hours 09/26/23 0829 09/28/23 1105   09/25/23 1500  Ampicillin -Sulbactam (UNASYN ) 3 g in sodium chloride  0.9 % 100 mL IVPB       Placed in And Linked Group   3 g 200 mL/hr over 30 Minutes Intravenous  Once 09/25/23 1445 09/25/23 1610   09/25/23 1500  linezolid  (ZYVOX ) tablet 600 mg       Placed in And Linked Group   600 mg Oral  Once 09/25/23 1445 09/25/23 1535       Subjective: Patient seen and examined at the bedside today.  He was comfortable lying on bed.  Alert and oriented.  Pain well-controlled.  Denies any new complaints.  He does not want to go back to his previous facility and wants to go back to a new facility.  Informed that his insurance is declined.  Patient will discuss with his family and Child psychotherapist.  Objective: Vitals:   10/04/23 1600 10/04/23 1959 10/05/23 0611 10/05/23 0730  BP: (!) 143/69 (!) 154/72 (!) 138/58 (!) 145/60  Pulse: 68 72 73 71  Resp: 18 19 18 14   Temp: 98.4 F (36.9 C) 98.6 F (37 C) 99.3 F  (37.4 C) 98.2 F (36.8 C)  TempSrc:  Oral Oral   SpO2: 98% 100% 98% 95%  Weight:      Height:        Intake/Output Summary (Last 24 hours) at 10/05/2023 1026 Last data filed at 10/05/2023 9171 Gross per 24 hour  Intake --  Output 930 ml  Net -930 ml   Filed Weights   09/25/23 2302  Weight: 101.4 kg    Examination:   General exam: Overall comfortable, not in distress,obese HEENT: PERRL Respiratory system:  no wheezes or crackles  Cardiovascular system: S1 & S2 heard, RRR.  Gastrointestinal system: Abdomen is nondistended, soft and nontender. Central nervous system: Alert and oriented, left-sided residual hemiparesis Extremities: Left BKA, wound VAC Skin: No rashes, no ulcers,no icterus  Data Reviewed: I have personally reviewed following labs and imaging studies  CBC: Recent Labs  Lab 09/29/23 1037 10/03/23 0610  WBC 11.7* 7.1  HGB 9.3* 8.3*  HCT 28.6* 24.7*  MCV 88.0 87.0  PLT 322 326   Basic Metabolic Panel: Recent Labs  Lab 09/29/23 0652 09/30/23 0656 10/03/23 0610  NA 137 139 134*  K 4.5 4.4 4.0  CL 108 113* 104  CO2 17* 17* 18*  GLUCOSE 204* 151* 122*  BUN 51* 37* 23  CREATININE 2.08* 1.34* 1.24  CALCIUM  8.5* 8.6* 8.6*     Recent Results (from the past 240 hours)  Blood Cultures x 2 sites     Status: None   Collection Time: 09/25/23  3:15 PM   Specimen: BLOOD  Result Value Ref Range Status   Specimen Description   Final    BLOOD SITE NOT SPECIFIED Performed at [facility], [address] [place], [place] [address]    Special Requests   Final    BOTTLES DRAWN AEROBIC AND ANAEROBIC Blood Culture adequate volume Performed at [facility], [address], [place], [place] [address]    Culture   Final    NO GROWTH 5 DAYS Performed at [facility], [address], [place], [place] [address]    Report Status 09/30/2023 FINAL  Final  Blood Cultures x 2 sites     Status: None   Collection  Time: 09/25/23 11:07 PM   Specimen: BLOOD  Result Value Ref Range Status   Specimen Description BLOOD SITE NOT SPECIFIED  Final   Special Requests   Final    BOTTLES DRAWN AEROBIC AND ANAEROBIC Blood Culture results may not be optimal due to an inadequate volume of blood received in culture bottles   Culture   Final    NO GROWTH 5 DAYS Performed at [facility], [address], [place], [place] [address]    Report Status 09/30/2023 FINAL  Final  MRSA Next Gen by PCR, Nasal     Status: None   Collection Time: 09/26/23  8:02 AM   Specimen: Nasal Mucosa; Nasal Swab  Result Value Ref Range Status   MRSA by PCR Next Gen NOT DETECTED NOT DETECTED Final    Comment: (NOTE) The GeneXpert MRSA Assay (FDA approved for NASAL specimens only), is one component of a comprehensive MRSA colonization surveillance program. It is not intended to diagnose MRSA infection nor to guide or monitor treatment for MRSA infections. Test performance is not FDA approved in patients less than 11 years old. Performed at [facility], [address], [place], [place] [address]      Radiology Studies: No results found.    Scheduled Meds:  amLODipine   10 mg Oral q morning   aspirin  EC  81 mg Oral Daily   atorvastatin   40 mg Oral QPM   carvedilol   12.5 mg Oral BID WC   clopidogrel   75 mg Oral Daily   docusate sodium   100 mg Oral Daily   DULoxetine   60 mg Oral Daily   heparin  injection (subcutaneous)  5,000 Units Subcutaneous Q8H   insulin  aspart  0-24 Units Subcutaneous TID WC   levothyroxine   75 mcg Oral QHS   pantoprazole   40 mg Oral Daily   pneumococcal 20-valent conjugate vaccine  0.5 mL Intramuscular Tomorrow-1000   polyethylene glycol  17 g Oral BID   pregabalin   75 mg Oral BID   sacubitril -valsartan   1 tablet Oral  BID   senna-docusate  1 tablet Oral BID   sodium bicarbonate   650 mg Oral TID   sodium chloride  flush  3 mL Intravenous Q12H   Continuous Infusions:     LOS: 10 days    Ivonne Mustache, MD [facility] P7/17/2025, 10:26 AM

## 2023-10-05 NOTE — TOC Progression Note (Signed)
 Transition of Care [facility]) - Progression Note    Patient Details  Name: Steve Alvarado MRN: [id number] Date of Birth: Sep 25, 1955  [facility]) CM/SW Contact  Bridget Cordella Simmonds, LCSW Phone Number: 10/05/2023, 3:01 PM  Clinical Narrative:   CSW spoke with pt sister Milan, who reports she attempted to submit the appeal with pt last night but was informed the shara was submitted wrong, and [employer] just needs to know if pt needs rehab for 30 days or 6 months.  CSW spoke with [employer]: case is not under appeal and appeal still needs to be filed.  Since that time, CSW has spent a total of more than 2 hours on the phone with the fast appeal number at [facility] [phone].  CSW has been transferred numerous times, received call again from navi that CSW was routed to the wrong department at [facility] and to please call again, called again and after 30 minute on hold was disconnected with no call back.  This appeal is still not filed.      Expected Discharge Plan: Skilled Nursing Facility Barriers to Discharge: Continued Medical Work up, SNF Pending bed offer  Expected Discharge Plan and Services In-house Referral: Clinical Social Work   Post Acute Care Choice: Skilled Nursing Facility Living arrangements for the past 2 months: Skilled Nursing Facility [facility])                                       Social Determinants of Health (SDOH) Interventions SDOH Screenings   Food Insecurity: No Food Insecurity (09/25/2023)  Housing: Low Risk  (09/25/2023)  Transportation Needs: No Transportation Needs (09/25/2023)  Utilities: Not At Risk (09/25/2023)  Depression (PHQ2-9): Low Risk  (12/31/2021)  Financial Resource Strain: High Risk (10/26/2020)   Received from [facility]  Social Connections: Unknown (09/25/2023)  Tobacco Use: Low Risk  (09/27/2023)    Readmission Risk Interventions    06/07/2023   11:17 AM 11/08/2021   11:09 AM  Readmission Risk Prevention Plan  Transportation  Screening Complete Complete  Medication Review Oceanographer) Complete Complete  PCP or Specialist appointment within 3-5 days of discharge Complete Complete  HRI or Home Care Consult Complete   SW Recovery Care/Counseling Consult Complete Complete  Palliative Care Screening Not Applicable Not Applicable  Skilled Nursing Facility Complete Complete

## 2023-10-06 DIAGNOSIS — M86179 Other acute osteomyelitis, unspecified ankle and foot: Secondary | ICD-10-CM | POA: Diagnosis not present

## 2023-10-06 LAB — BASIC METABOLIC PANEL WITH GFR
Anion gap: 11 (ref 5–15)
BUN: 16 mg/dL (ref 8–23)
CO2: 21 mmol/L — ABNORMAL LOW (ref 22–32)
Calcium: 8.8 mg/dL — ABNORMAL LOW (ref 8.9–10.3)
Chloride: 106 mmol/L (ref 98–111)
Creatinine, Ser: 1.07 mg/dL (ref 0.61–1.24)
GFR, Estimated: >60 mL/min (ref >60)
Glucose, Bld: 145 mg/dL — ABNORMAL HIGH (ref 70–99)
Potassium: 3.9 mmol/L (ref 3.5–5.1)
Sodium: 138 mmol/L (ref 135–145)

## 2023-10-06 LAB — GLUCOSE, CAPILLARY
Glucose-Capillary: 142 mg/dL — ABNORMAL HIGH (ref 70–99)
Glucose-Capillary: 137 mg/dL — ABNORMAL HIGH (ref 70–99)
Glucose-Capillary: 195 mg/dL — ABNORMAL HIGH (ref 70–99)
Glucose-Capillary: 183 mg/dL — ABNORMAL HIGH (ref 70–99)

## 2023-10-06 LAB — CBC
HCT: 26.1 % — ABNORMAL LOW (ref 39.0–52.0)
Hemoglobin: 8.5 g/dL — ABNORMAL LOW (ref 13.0–17.0)
MCH: 28.4 pg (ref 26.0–34.0)
MCHC: 32.6 g/dL (ref 30.0–36.0)
MCV: 87.3 fL (ref 80.0–100.0)
Platelets: 376 K/uL (ref 150–400)
RBC: 2.99 MIL/uL — ABNORMAL LOW (ref 4.22–5.81)
RDW: 13.3 % (ref 11.5–15.5)
WBC: 9.1 K/uL (ref 4.0–10.5)
nRBC: 0 % (ref 0.0–0.2)

## 2023-10-06 MED ORDER — POLYETHYLENE GLYCOL 3350 17 G PO PACK
17.0000 g | PACK | Freq: Two times a day (BID) | ORAL | Status: AC
  Administered 2023-10-06: 17 g via ORAL
  Filled 2023-10-06 (×2): qty 1

## 2023-10-06 MED ORDER — FUROSEMIDE 20 MG PO TABS
20.0000 mg | ORAL_TABLET | ORAL | Status: DC
  Administered 2023-10-08 – 2023-10-12 (×3): 20 mg via ORAL
  Filled 2023-10-06 (×4): qty 1

## 2023-10-06 NOTE — Plan of Care (Signed)

## 2023-10-06 NOTE — Progress Notes (Signed)
 Physical Therapy Treatment Patient Details Name: Steve Alvarado MRN: [id number] DOB: 05-Feb-1956 Today's Date: 10/06/2023   History of Present Illness Steve Alvarado is a 68 yo male who presented from SNF with L heel pain. 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 Pt giving good effort for bed mobility and transfer training this session, however continues to be limited by RLE weakness, LUE hemi-paresis, poor balance/postural reactions and decreased activity tolerance. With cues for sequencing pt able to come to sit EOB with pt follow commands and able to bring Les to and off EOB without assist and begin to elevate trunk. Pt able to come to stand in stedy frame x2 with heavy max A +2 due to L lateral lean and RLE weakness needing cues and assist to shift weight over RLE and tuck hips to elevate trunk. Pt performing seated and supine exercises for increased RLE strength to progress functional transfers. Pt placed in chair position in bed at end of session with all needs met. Pt continues to benefit from skilled PT services to progress toward functional mobility goals.     If plan is discharge home, recommend the following: Two people to help with walking and/or transfers;Two people to help with bathing/dressing/bathroom;Assistance with cooking/housework;Assist for transportation;Help with stairs or ramp for entrance   Can travel by private vehicle        Equipment Recommendations  Other (comment) (if going home will need hospital bed, wheelchair, hoyer)    Recommendations for Other Services       Precautions / Restrictions Precautions Precautions: Fall Recall of Precautions/Restrictions: Impaired Required Braces or  Orthoses: Splint/Cast;Other Brace Splint/Cast: L resting hand splint, L limb protector Other Brace: L limb protector Restrictions Weight Bearing Restrictions Per Provider Order: Yes LLE Weight Bearing Per Provider Order: Non weight bearing Other Position/Activity Restrictions: wound vac     Mobility  Bed Mobility Overal bed mobility: Needs Assistance Bed Mobility: Supine to Sit, Sit to Supine     Supine to sit: HOB elevated, Mod assist Sit to supine: Max assist, +2 for physical assistance   General bed mobility comments: VC for technique in order to transition from supine to sit. pt able to bring LEs to and off EOB and reach RUE across to bring torso over, mod A to then elevate torso, max A +2 to reutn to supine due to fatigue    Transfers Overall transfer level: Needs assistance Equipment used: Ambulation equipment used, Sliding board Transfers: Sit to/from Stand Sit to Stand: Max assist, +2 physical assistance, +2 safety/equipment, Via lift equipment           General transfer comment: max A +2 to boost to stand from elevated EOVb with RUE on stedy rail, strong L lateral lean with flexed posture, max cues and assist to shift weight to R over RLE and to tuck hips to bring stedy pads down. heavy max A +2 to stand from stedy pads again to shift weight to R due to increased RLE fatigue.    Ambulation/Gait               General Gait Details: unable at baseline   Optometrist  Tilt Bed    Modified Rankin (Stroke Patients Only)       Balance Overall balance assessment: Needs assistance Sitting-balance support: Feet supported, No upper extremity supported Sitting balance-Leahy Scale: Fair Sitting balance - Comments: improved sitting balance this date   Standing balance support: Single extremity supported, During functional activity Standing balance-Leahy Scale: Zero Standing balance comment: max A +2 needed to maintain  standing                            Communication Communication Communication: No apparent difficulties Factors Affecting Communication: Reduced clarity of speech (doesn't have dentures in)  Cognition Arousal: Alert Behavior During Therapy: WFL for tasks assessed/performed   PT - Cognitive impairments: Sequencing, Problem solving                         Following commands: Impaired Following commands impaired: Follows one step commands inconsistently    Cueing Cueing Techniques: Verbal cues, Visual cues, Tactile cues  Exercises General Exercises - Lower Extremity Long Arc Quad: AROM, Right, 10 reps, Seated (with 2 seoncd hold at end range) Other Exercises Other Exercises: supine single leg bridge with RLE x10 for increased RLE strength for carryover for transfers    General Comments General comments (skin integrity, edema, etc.): pt performing seated ADLS with set up      Pertinent Vitals/Pain Pain Assessment Pain Assessment: Faces Faces Pain Scale: Hurts a little bit Pain Location: bottom Pain Descriptors / Indicators: Grimacing, Aching, Guarding Pain Intervention(s): Monitored during session, Limited activity within patient's tolerance, Repositioned    Home Living                          Prior Function            PT Goals (current goals can now be found in the care plan section) Acute Rehab PT Goals PT Goal Formulation: With patient/family Time For Goal Achievement: 10/12/23 Progress towards PT goals: Progressing toward goals    Frequency    Min 2X/week      PT Plan      Co-evaluation              AM-PAC PT 6 Clicks Mobility   Outcome Measure  Help needed turning from your back to your side while in a flat bed without using bedrails?: A Lot Help needed moving from lying on your back to sitting on the side of a flat bed without using bedrails?: A Lot Help needed moving to and from a bed to a chair (including a  wheelchair)?: Total Help needed standing up from a chair using your arms (e.g., wheelchair or bedside chair)?: Total Help needed to walk in hospital room?: Total Help needed climbing 3-5 steps with a railing? : Total 6 Click Score: 8    End of Session Equipment Utilized During Treatment: Gait belt;Other (comment) (L limb protector) Activity Tolerance: Patient tolerated treatment well Patient left: with call bell/phone within reach;in bed (with bed on chair position) Nurse Communication: Mobility status PT Visit Diagnosis: Muscle weakness (generalized) (M62.81);Pain;Other abnormalities of gait and mobility (R26.89) Pain - Right/Left: Left Pain - part of body: Leg     Time: 8965-8942 PT Time Calculation (min) (ACUTE ONLY): 23 min  Charges:    $Therapeutic Exercise: 8-22 mins $Therapeutic Activity: 8-22 mins PT General Charges $$ ACUTE PT VISIT: 1 Visit  Therisa SAUNDERS. PTA Acute Rehabilitation Services Office: [phone]   Therisa CHRISTELLA Boor 10/06/2023, 11:12 AM

## 2023-10-06 NOTE — TOC Progression Note (Addendum)
 Transition of Care [facility]) - Progression Note    Patient Details  Name: Steve Alvarado MRN: [id number] Date of Birth: 1956-03-01  [facility]) CM/SW Contact  Gwenn Frieze [place], [place] Phone Number: 10/06/2023, 10:18 AM  Clinical Narrative: Per Shona with [facility], they are no longer able to offer a bed. Spoke to Tammy with [facility] who confirms pt able to return if he and family are agreeable.   SW and CM met with pt who had his dtr [name] on speaker phone and provided update. Pt and dtr currently refusing return to [facility] and requesting new LTC placement vs dc to dtr's home with [facility]. Will re-fax SNF referrals indicating need for LTC. Will provide updates as available.   Frieze Gwenn, MSW, LCSW [phone] (coverage)       Expected Discharge Plan: Skilled Nursing Facility Barriers to Discharge: Continued Medical Work up, SNF Pending bed offer  Expected Discharge Plan and Services In-house Referral: Clinical Social Work   Post Acute Care Choice: Skilled Nursing Facility Living arrangements for the past 2 months: Skilled Nursing Facility [facility])                                       Social Determinants of Health (SDOH) Interventions SDOH Screenings   Food Insecurity: No Food Insecurity (09/25/2023)  Housing: Low Risk  (09/25/2023)  Transportation Needs: No Transportation Needs (09/25/2023)  Utilities: Not At Risk (09/25/2023)  Depression (PHQ2-9): Low Risk  (12/31/2021)  Financial Resource Strain: High Risk (10/26/2020)   Received from [facility]  Social Connections: Unknown (09/25/2023)  Tobacco Use: Low Risk  (09/27/2023)    Readmission Risk Interventions    06/07/2023   11:17 AM 11/08/2021   11:09 AM  Readmission Risk Prevention Plan  Transportation Screening Complete Complete  Medication Review Oceanographer) Complete Complete  PCP or Specialist appointment within 3-5 days of discharge Complete Complete  HRI or Home  Care Consult Complete   SW Recovery Care/Counseling Consult Complete Complete  Palliative Care Screening Not Applicable Not Applicable  Skilled Nursing Facility Complete Complete

## 2023-10-06 NOTE — Progress Notes (Signed)
 [facility] PROGRESS NOTE    Progress Note  Steve Alvarado  FMW:[id number] DOB: Jan 15, 1956 DOA: 09/25/2023 PCP: Celinda Chrystal HERO, MD     Brief Narrative:   Steve Alvarado is an 68 y.o. 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   Assessment/Plan:   Acute osteomyelitis of calcaneum, unspecified laterality (HCC)/Osteomyelitis of left foot (HCC) MRI was suggestive of osteomyelitis. Orthopedic surgery was consulted he status post below-knee amputation. Antibiotics were discontinued now. PT evaluated the patient, he is awaiting skilled nursing facility placement. Wound VAC still in place due to to remove prior to discharge. Will need to follow-up with Dr. Harden as an outpatient.  Essential hypertension: Continue Coreg , Entresto  and amlodipine . Blood pressure is improved this morning.  History of CVA: Continue aspirin  and Plavix . He was at a skilled nursing facility.  Hypothyroidism:  Continue Synthroid .  Acute kidney injury on chronic kidney disease stage IIIa: Creatinine returned to baseline.  Chronic pain syndrome: Continue Lyrica .  Normocytic anemia/acute blood loss anemia: Likely due to acute blood loss from amputation. Hemoglobin ranging around 8.  HFpEF: Continue Coreg  Entresto  and Lasix .  Insomnia: Continue trazodone .  Toxic metabolic encephalopathy: Likely due to medications. Baclofen  was held his mentation improved. CT showed no acute findings.  Try to minimize narcotics as much as possible.  Constipation: Continue MiraLAX  p.o. twice daily   Stage I sacral decubitus ulcer present on admission: RN  Pressure Injury Documentation: Pressure Injury 06/06/23 Buttocks Right Stage 1 -  Intact skin with non-blanchable redness of a localized area usually over a bony prominence. stage 2 (Active)  06/06/23 1600  Location: Buttocks  Location Orientation: Right  Staging: Stage 1 -  Intact skin with non-blanchable redness of a localized area usually over a bony prominence.  Wound Description (Comments): stage 2  DO NOT USE:  Present on Admission: Yes  Dressing Type Foam - Lift dressing to assess site every shift 10/05/23 2115     DVT prophylaxis: lovenox  Family Communication:none Status is: Inpatient Remains inpatient appropriate because: Acute osteomyelitis    Code Status:     Code Status Orders  (From admission, onward)           Start     Ordered   09/25/23 1853  Full code  Continuous       Question:  By:  Answer:  Consent: discussion documented in EHR   09/25/23 1853           Code Status History     Date Active Date Inactive Code Status Order ID Comments User Context   06/05/2023 2034 06/07/2023 2119 Full Code [id number]  Charlton Evalene RAMAN, MD ED   03/26/2023 2111 03/28/2023 2147 Full Code [id number]  Lee Kingfisher, MD ED   03/26/2023 2047 03/26/2023 2110 Full Code [id number]  Lee Kingfisher, MD ED   08/28/2022 0341 09/01/2022 1829 Full Code [id number]  Cleatus Delayne GAILS, MD ED   07/26/2022 0152 07/28/2022 2107 Full Code [id number]  Mansy, Madison LABOR, MD ED   11/07/2021 2314 11/09/2021 2119 Full Code [id number]  Cleatus Delayne GAILS, MD ED   08/20/2021 0523 08/25/2021 1950 Full Code [id number]  Kenard Zachary PARAS, MD ED   08/12/2021 1210 08/18/2021 1956 Full Code [id number]  Waddell Karna LABOR, NP ED   07/09/2021 0044 07/12/2021 1648 Full Code [id number]  Mansy, Madison LABOR, MD ED   10/21/2020 1230 10/23/2020 1901 Full Code [id number]  Lanetta Lingo, MD ED   09/04/2020 1948 09/05/2020 2130 Full Code [id number]  Lang Dover, MD ED   09/02/2020 0443 09/03/2020 1543 Full Code [id number]  Robinette Vermell PARAS, MD ED   08/06/2020 0413  08/10/2020 2119 Full Code [id number]  Mansy, Madison LABOR, MD ED   03/08/2015 2247 03/10/2015 1433 Full Code [id number]  Oleh, Alm POUR, MD ED   03/07/2015 0803 03/07/2015 1805 Full Code [id number]  Stephania Ozell RAMAN Inpatient         IV Access:   Peripheral IV   Procedures and diagnostic studies:   No results found.   Medical Consultants:   None.   Subjective:    Steve Alvarado no complaints  Objective:    Vitals:   10/05/23 0730 10/05/23 1352 10/05/23 1941 10/06/23 0421  BP: (!) 145/60 (!) 146/64 137/63 (!) 141/60  Pulse: 71 68 65 66  Resp: 14 16 15 15   Temp: 98.2 F (36.8 C) 99.1 F (37.3 C) 98.6 F (37 C) 98.6 F (37 C)  TempSrc:   Oral Oral  SpO2: 95% 100% 98% 99%  Weight:      Height:       SpO2: 99 % O2 Flow Rate (L/min): 6 L/min   Intake/Output Summary (Last 24 hours) at 10/06/2023 0625 Last data filed at 10/06/2023 0100 Gross per 24 hour  Intake --  Output 1050 ml  Net -1050 ml   Filed Weights   09/25/23 2302  Weight: 101.4 kg    Exam: General exam: In no acute distress. Respiratory system: Good air movement and clear to auscultation. Cardiovascular system: S1 & S2 heard, RRR. No JVD. Gastrointestinal system: Abdomen is nondistended, soft and nontender.  Extremities: No pedal edema. Skin: No rashes, lesions or ulcers Psychiatry: Judgement and insight appear normal. Mood & affect appropriate.    Data Reviewed:    Labs: Basic Metabolic Panel: Recent Labs  Lab 09/29/23 0652 09/30/23 0656 10/03/23 0610 10/06/23 0506  NA 137 139 134* 138  K 4.5 4.4 4.0 3.9  CL 108 113* 104 106  CO2 17* 17* 18* 21*  GLUCOSE 204* 151* 122* 145*  BUN 51* 37* 23 16  CREATININE 2.08* 1.34* 1.24 1.07  CALCIUM  8.5* 8.6* 8.6* 8.8*   GFR Estimated Creatinine Clearance: 82.9 mL/min (by C-G formula based on SCr of 1.07 mg/dL). Liver Function Tests: No results for input(s): AST, ALT, ALKPHOS, BILITOT, PROT, ALBUMIN in the last 168 hours. No  results for input(s): LIPASE, AMYLASE in the last 168 hours. No results for input(s): AMMONIA in the last 168 hours. Coagulation profile No results for input(s): INR, PROTIME in the last 168 hours. COVID-19 Labs  No results for input(s): DDIMER, FERRITIN, LDH, CRP in the last 72 hours.  Lab Results  Component Value Date   SARSCOV2NAA NEGATIVE 06/05/2023   SARSCOV2NAA NEGATIVE 04/07/2023   SARSCOV2NAA NEGATIVE 03/26/2023   SARSCOV2NAA NEGATIVE 04/26/2022    CBC: Recent Labs  Lab 09/29/23 1037 10/03/23 0610 10/06/23 0506  WBC 11.7* 7.1 9.1  HGB 9.3* 8.3* 8.5*  HCT 28.6* 24.7* 26.1*  MCV 88.0 87.0 87.3  PLT 322 326 376   Cardiac Enzymes: No results for input(s): CKTOTAL, CKMB, CKMBINDEX, TROPONINI in the last 168 hours. BNP (last 3 results) No results for input(s): PROBNP in the last 8760 hours. CBG: Recent Labs  Lab 10/05/23 0602 10/05/23 1150 10/05/23 1709 10/05/23 2135 10/06/23 0530  GLUCAP 137* 172* 136* 141* 142*   D-Dimer: No results for input(s): DDIMER in the last 72 hours. Hgb A1c: No results for input(s): HGBA1C in the last 72 hours. Lipid Profile: No results for input(s): CHOL, HDL, LDLCALC, TRIG, CHOLHDL, LDLDIRECT in the last 72 hours. Thyroid  function studies: No results for input(s): TSH, T4TOTAL, T3FREE, THYROIDAB in the last 72 hours.  Invalid input(s): FREET3 Anemia work up: No results for input(s): VITAMINB12, FOLATE, FERRITIN, TIBC, IRON, RETICCTPCT in the last 72 hours. Sepsis Labs: Recent Labs  Lab 09/29/23 1037 10/03/23 0610 10/06/23 0506  WBC 11.7* 7.1 9.1   Microbiology Recent Results (from the past 240 hours)  MRSA Next Gen by PCR, Nasal     Status: None   Collection Time: 09/26/23  8:02 AM   Specimen: Nasal Mucosa; Nasal Swab  Result Value Ref Range Status   MRSA by PCR Next Gen NOT DETECTED NOT DETECTED Final    Comment: (NOTE) The GeneXpert MRSA Assay (FDA  approved for NASAL specimens only), is one component of a comprehensive MRSA colonization surveillance program. It is not intended to diagnose MRSA infection nor to guide or monitor treatment for MRSA infections. Test performance is not FDA approved in patients less than 76 years old. Performed at [facility], [address] [place], [place] [address]      Medications:    amLODipine   10 mg Oral q morning   aspirin  EC  81 mg Oral Daily   atorvastatin   40 mg Oral QPM   carvedilol   12.5 mg Oral BID WC   clopidogrel   75 mg Oral Daily   docusate sodium   100 mg Oral Daily   DULoxetine   60 mg Oral Daily   heparin  injection (subcutaneous)  5,000 Units Subcutaneous Q8H   insulin  aspart  0-24 Units Subcutaneous TID WC   levothyroxine   75 mcg Oral QHS   pantoprazole   40 mg Oral Daily   pneumococcal 20-valent conjugate vaccine  0.5 mL Intramuscular Tomorrow-1000   polyethylene glycol  17 g Oral BID   pregabalin   75 mg Oral BID   sacubitril -valsartan   1 tablet Oral BID   senna-docusate  1 tablet Oral BID   sodium bicarbonate   650 mg Oral TID   sodium chloride  flush  3 mL Intravenous Q12H   Continuous Infusions:    LOS: 11 days   Steve Alvarado  [facility]  10/06/2023, 6:25 AM

## 2023-10-07 DIAGNOSIS — M86179 Other acute osteomyelitis, unspecified ankle and foot: Secondary | ICD-10-CM | POA: Diagnosis not present

## 2023-10-07 LAB — GLUCOSE, CAPILLARY
Glucose-Capillary: 159 mg/dL — ABNORMAL HIGH (ref 70–99)
Glucose-Capillary: 147 mg/dL — ABNORMAL HIGH (ref 70–99)
Glucose-Capillary: 143 mg/dL — ABNORMAL HIGH (ref 70–99)
Glucose-Capillary: 134 mg/dL — ABNORMAL HIGH (ref 70–99)
Glucose-Capillary: 133 mg/dL — ABNORMAL HIGH (ref 70–99)

## 2023-10-07 NOTE — Progress Notes (Signed)
 [facility] PROGRESS NOTE    Progress Note  Steve Alvarado  FMW:[id number] DOB: January 27, 1956 DOA: 09/25/2023 PCP: Celinda Chrystal HERO, MD     Brief Narrative:   Steve Alvarado is an 68 y.o. 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  Assessment/Plan:   Acute osteomyelitis of calcaneum, unspecified laterality (HCC)/Osteomyelitis of left foot (HCC) MRI was suggestive of osteomyelitis. Orthopedic surgery was consulted he status post below-knee amputation. Antibiotics were discontinued now. PT evaluated the patient, he is awaiting skilled nursing facility placement. Wound VAC still in place due to to remove prior to discharge. Will need to follow-up with Dr. Harden as an outpatient. Patient daughter is refusing [facility] and requested new facility versus discharge home with home health.  Social worker will helping with disposition. Patient medically stable awaiting placement to skilled nursing facility.  Essential hypertension: Continue Coreg , Entresto  and amlodipine . Blood pressure is improved this morning.  History of CVA: Continue aspirin  and Plavix . Will need skilled nursing facility placement.  Hypothyroidism:  Continue Synthroid .  Acute kidney injury on chronic kidney disease stage IIIa: Creatinine returned to baseline.  Chronic pain syndrome: Continue Lyrica .  Normocytic anemia/acute blood loss anemia: Likely due to acute blood loss from amputation. Hemoglobin ranging around 8.  HFpEF: Continue Coreg  Entresto  and Lasix .  Insomnia: Continue trazodone .  Toxic metabolic encephalopathy: Likely due to  medications. Baclofen  was held his mentation improved. CT showed no acute findings.  Try to minimize narcotics as much as possible.  Constipation: Continue MiraLAX  p.o. twice daily   Stage I sacral decubitus ulcer present on admission: RN Pressure Injury Documentation: Pressure Injury 06/06/23 Buttocks Right Stage 1 -  Intact skin with non-blanchable redness of a localized area usually over a bony prominence. stage 2 (Active)  06/06/23 1600  Location: Buttocks  Location Orientation: Right  Staging: Stage 1 -  Intact skin with non-blanchable redness of a localized area usually over a bony prominence.  Wound Description (Comments): stage 2  DO NOT USE:  Present on Admission: Yes  Dressing Type Foam - Lift dressing to assess site every shift 10/06/23 1100     DVT prophylaxis: lovenox  Family Communication:none Status is: Inpatient Remains inpatient appropriate because: Acute osteomyelitis    Code Status:     Code Status Orders  (From admission, onward)           Start     Ordered   09/25/23 1853  Full code  Continuous       Question:  By:  Answer:  Consent: discussion documented in EHR   09/25/23 1853           Code Status History     Date Active Date Inactive Code Status Order ID Comments User Context   06/05/2023 2034 06/07/2023 2119 Full Code [id number]  Charlton Evalene RAMAN, MD ED   03/26/2023 2111 03/28/2023 2147 Full Code [id number]  Lee Kingfisher, MD ED   03/26/2023 2047 03/26/2023 2110 Full Code [id number]  Lee Kingfisher, MD ED   08/28/2022 0341 09/01/2022 1829 Full Code [id number]  Cleatus Delayne GAILS, MD ED   07/26/2022 0152 07/28/2022 2107 Full Code [id number]  Mansy, Madison LABOR, MD ED   11/07/2021 2314 11/09/2021 2119 Full Code [id number]  Cleatus Delayne GAILS, MD ED   08/20/2021 0523 08/25/2021 1950 Full Code [id number]  Kenard Zachary PARAS, MD ED   08/12/2021 1210 08/18/2021 1956 Full Code [id number]  Waddell Karna LABOR, NP ED   07/09/2021 0044 07/12/2021 1648 Full Code [id number]  Mansy, Madison LABOR, MD ED    10/21/2020 1230 10/23/2020 1901 Full Code [id number]  Lanetta Lingo, MD ED   09/04/2020 1948 09/05/2020 2130 Full Code [id number]  Lang Dover, MD ED   09/02/2020 0443 09/03/2020 1543 Full Code [id number]  Robinette Vermell PARAS, MD ED   08/06/2020 0413 08/10/2020 2119 Full Code [id number]  Mansy, Madison LABOR, MD ED   03/08/2015 2247 03/10/2015 1433 Full Code [id number]  Oleh, Alm POUR, MD ED   03/07/2015 0803 03/07/2015 1805 Full Code [id number]  Stephania Ozell RAMAN Inpatient         IV Access:   Peripheral IV   Procedures and diagnostic studies:   No results found.   Medical Consultants:   None.   Subjective:    Steve Alvarado no complaints  Objective:    Vitals:   10/06/23 1413 10/06/23 2038 10/07/23 0514 10/07/23 0743  BP: (!) 141/60 (!) 124/49 (!) 141/65 (!) 140/57  Pulse: 65 64 73 67  Resp: 16 16 16 19   Temp: 98.1 F (36.7 C) 99.6 F (37.6 C) 99.5 F (37.5 C) 98.5 F (36.9 C)  TempSrc: Oral Oral Oral   SpO2: 95% 100% 97% 100%  Weight:      Height:       SpO2: 100 % O2 Flow Rate (L/min): 6 L/min   Intake/Output Summary (Last 24 hours) at 10/07/2023 0815 Last data filed at 10/07/2023 0200 Gross per 24 hour  Intake --  Output 500 ml  Net -500 ml   Filed Weights   09/25/23 2302  Weight: 101.4 kg    Exam: General exam: In no acute distress. Respiratory system: Good air movement and clear to auscultation. Cardiovascular system: S1 & S2 heard, RRR. No JVD. Gastrointestinal system: Abdomen is nondistended, soft and nontender.  Extremities: No pedal edema. Skin: No rashes, lesions or ulcers Psychiatry: Judgement and insight appear normal. Mood & affect appropriate.    Data Reviewed:    Labs: Basic Metabolic Panel: Recent Labs  Lab 10/03/23 0610 10/06/23 0506  NA 134* 138  K 4.0 3.9  CL 104 106  CO2 18* 21*  GLUCOSE 122* 145*  BUN 23 16  CREATININE 1.24 1.07  CALCIUM  8.6* 8.8*   GFR Estimated Creatinine Clearance: 82.9 mL/min (by C-G formula  based on SCr of 1.07 mg/dL). Liver Function Tests: No results for input(s): AST, ALT, ALKPHOS, BILITOT, PROT, ALBUMIN in the last 168 hours. No results for input(s): LIPASE, AMYLASE in the last 168 hours. No results for input(s): AMMONIA in the last 168 hours. Coagulation profile No results for input(s): INR, PROTIME in the last 168 hours. COVID-19 Labs  No results for input(s): DDIMER, FERRITIN, LDH, CRP in the last 72 hours.  Lab Results  Component Value Date   SARSCOV2NAA NEGATIVE 06/05/2023   SARSCOV2NAA NEGATIVE 04/07/2023   SARSCOV2NAA NEGATIVE 03/26/2023   SARSCOV2NAA NEGATIVE 04/26/2022    CBC: Recent Labs  Lab 10/03/23 0610 10/06/23 0506  WBC 7.1 9.1  HGB 8.3* 8.5*  HCT 24.7* 26.1*  MCV 87.0 87.3  PLT 326 376   Cardiac Enzymes: No results for input(s): CKTOTAL, CKMB, CKMBINDEX, TROPONINI in the last 168 hours. BNP (last 3 results) No results for input(s): PROBNP in the last 8760 hours.  CBG: Recent Labs  Lab 10/06/23 1129 10/06/23 1706 10/06/23 2153 10/07/23 0642 10/07/23 0743  GLUCAP 137* 195* 183* 159* 147*   D-Dimer: No results for input(s): DDIMER in the last 72 hours. Hgb A1c: No results for input(s): HGBA1C in the last 72 hours. Lipid Profile: No results for input(s): CHOL, HDL, LDLCALC, TRIG, CHOLHDL, LDLDIRECT in the last 72 hours. Thyroid  function studies: No results for input(s): TSH, T4TOTAL, T3FREE, THYROIDAB in the last 72 hours.  Invalid input(s): FREET3 Anemia work up: No results for input(s): VITAMINB12, FOLATE, FERRITIN, TIBC, IRON, RETICCTPCT in the last 72 hours. Sepsis Labs: Recent Labs  Lab 10/03/23 0610 10/06/23 0506  WBC 7.1 9.1   Microbiology No results found for this or any previous visit (from the past 240 hours).    Medications:    amLODipine   10 mg Oral q morning   aspirin  EC  81 mg Oral Daily   atorvastatin   40 mg Oral QPM    carvedilol   12.5 mg Oral BID WC   clopidogrel   75 mg Oral Daily   docusate sodium   100 mg Oral Daily   DULoxetine   60 mg Oral Daily   furosemide   20 mg Oral QODAY   heparin  injection (subcutaneous)  5,000 Units Subcutaneous Q8H   insulin  aspart  0-24 Units Subcutaneous TID WC   levothyroxine   75 mcg Oral QHS   pantoprazole   40 mg Oral Daily   pneumococcal 20-valent conjugate vaccine  0.5 mL Intramuscular Tomorrow-1000   polyethylene glycol  17 g Oral BID   pregabalin   75 mg Oral BID   sacubitril -valsartan   1 tablet Oral BID   senna-docusate  1 tablet Oral BID   sodium bicarbonate   650 mg Oral TID   sodium chloride  flush  3 mL Intravenous Q12H   Continuous Infusions:    LOS: 12 days   Steve Alvarado  [facility]  10/07/2023, 8:15 AM

## 2023-10-07 NOTE — Plan of Care (Signed)
  Problem: Education: Goal: Knowledge of General Education information will improve Description: Including pain rating scale, medication(s)/side effects and non-pharmacologic comfort measures Outcome: Progressing   Problem: Health Behavior/Discharge Planning: Goal: Ability to manage health-related needs will improve Outcome: Progressing   Problem: Clinical Measurements: Goal: Ability to maintain clinical measurements within normal limits will improve Outcome: Progressing Goal: Will remain free from infection Outcome: Progressing Goal: Diagnostic test results will improve Outcome: Progressing Goal: Respiratory complications will improve Outcome: Progressing Goal: Cardiovascular complication will be avoided Outcome: Progressing   Problem: Activity: Goal: Risk for activity intolerance will decrease Outcome: Progressing   Problem: Nutrition: Goal: Adequate nutrition will be maintained Outcome: Progressing   Problem: Coping: Goal: Level of anxiety will decrease Outcome: Progressing   Problem: Elimination: Goal: Will not experience complications related to bowel motility Outcome: Progressing Goal: Will not experience complications related to urinary retention Outcome: Progressing   Problem: Pain Managment: Goal: General experience of comfort will improve and/or be controlled Outcome: Progressing   Problem: Safety: Goal: Ability to remain free from injury will improve Outcome: Progressing   Problem: Skin Integrity: Goal: Risk for impaired skin integrity will decrease Outcome: Progressing   Problem: Education: Goal: Knowledge of the prescribed therapeutic regimen will improve Outcome: Progressing Goal: Ability to verbalize activity precautions or restrictions will improve Outcome: Progressing Goal: Understanding of discharge needs will improve Outcome: Progressing   Problem: Activity: Goal: Ability to perform//tolerate increased activity and mobilize with assistive  devices will improve Outcome: Progressing   Problem: Clinical Measurements: Goal: Postoperative complications will be avoided or minimized Outcome: Progressing   Problem: Self-Care: Goal: Ability to meet self-care needs will improve Outcome: Progressing   Problem: Self-Concept: Goal: Ability to maintain and perform role responsibilities to the fullest extent possible will improve Outcome: Progressing   Problem: Pain Management: Goal: Pain level will decrease with appropriate interventions Outcome: Progressing   Problem: Skin Integrity: Goal: Demonstration of wound healing without infection will improve Outcome: Progressing

## 2023-10-08 DIAGNOSIS — M86179 Other acute osteomyelitis, unspecified ankle and foot: Secondary | ICD-10-CM | POA: Diagnosis not present

## 2023-10-08 LAB — GLUCOSE, CAPILLARY
Glucose-Capillary: 144 mg/dL — ABNORMAL HIGH (ref 70–99)
Glucose-Capillary: 165 mg/dL — ABNORMAL HIGH (ref 70–99)
Glucose-Capillary: 110 mg/dL — ABNORMAL HIGH (ref 70–99)
Glucose-Capillary: 145 mg/dL — ABNORMAL HIGH (ref 70–99)

## 2023-10-08 NOTE — TOC Progression Note (Signed)
 Transition of Care [facility]) - Progression Note    Patient Details  Name: Steve Alvarado MRN: [id number] Date of Birth: 1956/03/11  Transition [facility] CM/SW Contact  Cena Ligas, [place] Phone Number: 10/08/2023, 9:15 AM  Clinical Narrative:     [facility] has no new bed offers at this time. CSW will continue to follow for updates.   Expected Discharge Plan: Skilled Nursing Facility Barriers to Discharge: Continued Medical Work up, SNF Pending bed offer  Expected Discharge Plan and Services In-house Referral: Clinical Social Work   Post Acute Care Choice: Skilled Nursing Facility Living arrangements for the past 2 months: Skilled Nursing Facility [facility])                                       Social Determinants of Health (SDOH) Interventions SDOH Screenings   Food Insecurity: No Food Insecurity (09/25/2023)  Housing: Low Risk  (09/25/2023)  Transportation Needs: No Transportation Needs (09/25/2023)  Utilities: Not At Risk (09/25/2023)  Depression (PHQ2-9): Low Risk  (12/31/2021)  Financial Resource Strain: High Risk (10/26/2020)   Received from [facility]  Social Connections: Unknown (09/25/2023)  Tobacco Use: Low Risk  (09/27/2023)    Readmission Risk Interventions    06/07/2023   11:17 AM 11/08/2021   11:09 AM  Readmission Risk Prevention Plan  Transportation Screening Complete Complete  Medication Review [employer]) Complete Complete  PCP or Specialist appointment within 3-5 days of discharge Complete Complete  HRI or Home Care Consult Complete   SW Recovery Care/Counseling Consult Complete Complete  Palliative Care Screening Not Applicable Not Applicable  Skilled Nursing Facility Complete Complete

## 2023-10-08 NOTE — Progress Notes (Signed)
 [facility] PROGRESS NOTE    Progress Note  Steve Alvarado  FMW:[id number] DOB: Sep 02, 1955 DOA: 09/25/2023 PCP: Celinda Chrystal HERO, MD     Brief Narrative:   Steve Alvarado is an 68 y.o. 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  Assessment/Plan:   Acute osteomyelitis of calcaneum, unspecified laterality (HCC)/Osteomyelitis of left foot (HCC) MRI was suggestive of osteomyelitis. Orthopedic surgery was consulted he status post below-knee amputation. Antibiotics were discontinued now. PT evaluated the patient, he is awaiting skilled nursing facility placement. Wound VAC still in place due to to remove prior to discharge. Will need to follow-up with Dr. Harden as an outpatient. Patient daughter is refusing [facility] and requested new facility versus discharge home with home health.  Social worker will helping with disposition. Patient medically stable awaiting placement to skilled nursing facility.  Essential hypertension: Continue Coreg , Entresto  and amlodipine . Blood pressure is improved this morning.  History of CVA: Continue aspirin  and Plavix . Will need skilled nursing facility placement.  Hypothyroidism:  Continue Synthroid .  Acute kidney injury on chronic kidney disease stage IIIa: Creatinine returned to baseline.  Chronic pain syndrome: Continue Lyrica .  Normocytic anemia/acute blood loss anemia: Likely due to acute blood loss from amputation. Hemoglobin ranging around 8.  HFpEF: Continue Coreg  Entresto  and Lasix .  Insomnia: Continue trazodone .  Toxic metabolic encephalopathy: Likely due to  medications. Baclofen  was held his mentation improved. CT showed no acute findings.  Try to minimize narcotics as much as possible.  Constipation: Continue MiraLAX  p.o. twice daily   Stage I sacral decubitus ulcer present on admission: RN Pressure Injury Documentation: Pressure Injury 06/06/23 Buttocks Right Stage 1 -  Intact skin with non-blanchable redness of a localized area usually over a bony prominence. stage 2 (Active)  06/06/23 1600  Location: Buttocks  Location Orientation: Right  Staging: Stage 1 -  Intact skin with non-blanchable redness of a localized area usually over a bony prominence.  Wound Description (Comments): stage 2  DO NOT USE:  Present on Admission: Yes  Dressing Type Foam - Lift dressing to assess site every shift 10/06/23 1100     DVT prophylaxis: lovenox  Family Communication:none Status is: Inpatient Remains inpatient appropriate because: Acute osteomyelitis    Code Status:     Code Status Orders  (From admission, onward)           Start     Ordered   09/25/23 1853  Full code  Continuous       Question:  By:  Answer:  Consent: discussion documented in EHR   09/25/23 1853           Code Status History     Date Active Date Inactive Code Status Order ID Comments User Context   06/05/2023 2034 06/07/2023 2119 Full Code [id number]  Charlton Evalene RAMAN, MD ED   03/26/2023 2111 03/28/2023 2147 Full Code [id number]  Lee Kingfisher, MD ED   03/26/2023 2047 03/26/2023 2110 Full Code [id number]  Lee Kingfisher, MD ED   08/28/2022 0341 09/01/2022 1829 Full Code [id number]  Cleatus Delayne GAILS, MD ED   07/26/2022 0152 07/28/2022 2107 Full Code [id number]  Mansy, Madison LABOR, MD ED   11/07/2021 2314 11/09/2021 2119 Full Code [id number]  Cleatus Delayne GAILS, MD ED   08/20/2021 0523 08/25/2021 1950 Full Code [id number]  Kenard Zachary PARAS, MD ED   08/12/2021 1210 08/18/2021 1956 Full Code [id number]  Waddell Karna LABOR, NP ED   07/09/2021 0044 07/12/2021 1648 Full Code [id number]  Mansy, Madison LABOR, MD ED    10/21/2020 1230 10/23/2020 1901 Full Code [id number]  Lanetta Lingo, MD ED   09/04/2020 1948 09/05/2020 2130 Full Code [id number]  Lang Dover, MD ED   09/02/2020 0443 09/03/2020 1543 Full Code [id number]  Robinette Vermell PARAS, MD ED   08/06/2020 0413 08/10/2020 2119 Full Code [id number]  Mansy, Madison LABOR, MD ED   03/08/2015 2247 03/10/2015 1433 Full Code [id number]  Oleh, Alm POUR, MD ED   03/07/2015 0803 03/07/2015 1805 Full Code [id number]  Stephania Ozell RAMAN Inpatient         IV Access:   Peripheral IV   Procedures and diagnostic studies:   No results found.   Medical Consultants:   None.   Subjective:    Steve Alvarado no complaints  Objective:    Vitals:   10/07/23 1407 10/07/23 2014 10/08/23 0442 10/08/23 0718  BP: 134/64 135/63 (!) 146/69 (!) 145/67  Pulse: 65 62 69 74  Resp: 19 16 17 17   Temp: 98.7 F (37.1 C) 98.9 F (37.2 C) 98.6 F (37 C) 98 F (36.7 C)  TempSrc:  Oral Oral Oral  SpO2: 98% 98% 98% 100%  Weight:      Height:       SpO2: 100 % O2 Flow Rate (L/min): 6 L/min   Intake/Output Summary (Last 24 hours) at 10/08/2023 0828 Last data filed at 10/08/2023 0500 Gross per 24 hour  Intake --  Output 300 ml  Net -300 ml   Filed Weights   09/25/23 2302  Weight: 101.4 kg    Exam: General exam: In no acute distress. Respiratory system: Good air movement and clear to auscultation. Cardiovascular system: S1 & S2 heard, RRR. No JVD. Gastrointestinal system: Abdomen is nondistended, soft and nontender.  Extremities: No pedal edema. Skin: No rashes, lesions or ulcers Psychiatry: Judgement and insight appear normal. Mood & affect appropriate.    Data Reviewed:    Labs: Basic Metabolic Panel: Recent Labs  Lab 10/03/23 0610 10/06/23 0506  NA 134* 138  K 4.0 3.9  CL 104 106  CO2 18* 21*  GLUCOSE 122* 145*  BUN 23 16  CREATININE 1.24 1.07  CALCIUM  8.6* 8.8*   GFR Estimated Creatinine Clearance: 82.9 mL/min (by C-G formula based on SCr of  1.07 mg/dL). Liver Function Tests: No results for input(s): AST, ALT, ALKPHOS, BILITOT, PROT, ALBUMIN in the last 168 hours. No results for input(s): LIPASE, AMYLASE in the last 168 hours. No results for input(s): AMMONIA in the last 168 hours. Coagulation profile No results for input(s): INR, PROTIME in the last 168 hours. COVID-19 Labs  No results for input(s): DDIMER, FERRITIN, LDH, CRP in the last 72 hours.  Lab Results  Component Value Date   SARSCOV2NAA NEGATIVE 06/05/2023   SARSCOV2NAA NEGATIVE 04/07/2023   SARSCOV2NAA NEGATIVE 03/26/2023   SARSCOV2NAA NEGATIVE 04/26/2022    CBC: Recent Labs  Lab 10/03/23 0610 10/06/23 0506  WBC 7.1 9.1  HGB 8.3* 8.5*  HCT 24.7* 26.1*  MCV 87.0 87.3  PLT 326 376   Cardiac Enzymes: No results for input(s): CKTOTAL, CKMB, CKMBINDEX, TROPONINI in the last 168 hours. BNP (last 3 results) No results for input(s): PROBNP in the last 8760 hours. CBG: Recent  Labs  Lab 10/07/23 0743 10/07/23 1151 10/07/23 1643 10/07/23 2149 10/08/23 0623  GLUCAP 147* 143* 134* 133* 144*   D-Dimer: No results for input(s): DDIMER in the last 72 hours. Hgb A1c: No results for input(s): HGBA1C in the last 72 hours. Lipid Profile: No results for input(s): CHOL, HDL, LDLCALC, TRIG, CHOLHDL, LDLDIRECT in the last 72 hours. Thyroid  function studies: No results for input(s): TSH, T4TOTAL, T3FREE, THYROIDAB in the last 72 hours.  Invalid input(s): FREET3 Anemia work up: No results for input(s): VITAMINB12, FOLATE, FERRITIN, TIBC, IRON, RETICCTPCT in the last 72 hours. Sepsis Labs: Recent Labs  Lab 10/03/23 0610 10/06/23 0506  WBC 7.1 9.1   Microbiology No results found for this or any previous visit (from the past 240 hours).    Medications:    amLODipine   10 mg Oral q morning   aspirin  EC  81 mg Oral Daily   atorvastatin   40 mg Oral QPM   carvedilol   12.5 mg  Oral BID WC   clopidogrel   75 mg Oral Daily   docusate sodium   100 mg Oral Daily   DULoxetine   60 mg Oral Daily   furosemide   20 mg Oral QODAY   heparin  injection (subcutaneous)  5,000 Units Subcutaneous Q8H   insulin  aspart  0-24 Units Subcutaneous TID WC   levothyroxine   75 mcg Oral QHS   pantoprazole   40 mg Oral Daily   pneumococcal 20-valent conjugate vaccine  0.5 mL Intramuscular Tomorrow-1000   pregabalin   75 mg Oral BID   sacubitril -valsartan   1 tablet Oral BID   senna-docusate  1 tablet Oral BID   sodium bicarbonate   650 mg Oral TID   sodium chloride  flush  3 mL Intravenous Q12H   Continuous Infusions:    LOS: 13 days   Erle Odell Castor  [facility]  10/08/2023, 8:28 AM

## 2023-10-08 NOTE — TOC Progression Note (Signed)
 Transition of Care [facility]) - Progression Note    Patient Details  Name: Steve Alvarado MRN: [id number] Date of Birth: [birth year]  [facility] CM/SW Contact  Robynn Eileen Hoose, RN Phone Number: 10/08/2023, 3:01 PM  Clinical Narrative:   Secure message from floor nurse that patient requested to speak with me. Spoke with patient at bedside, patient had questions about rehab placement. Explained to patient that, per CSW note, waiting on bed offers. Patient reports question has been answered.    Expected Discharge Plan: Skilled Nursing Facility Barriers to Discharge: Continued Medical Work up, SNF Pending bed offer  Expected Discharge Plan and Services In-house Referral: Clinical Social Work   Post Acute Care Choice: Skilled Nursing Facility Living arrangements for the past 2 months: Skilled Nursing Facility [facility])                                       Social Determinants of Health (SDOH) Interventions SDOH Screenings   Food Insecurity: No Food Insecurity (09/25/2023)  Housing: Low Risk  (09/25/2023)  Transportation Needs: No Transportation Needs (09/25/2023)  Utilities: Not At Risk (09/25/2023)  Depression (PHQ2-9): Low Risk  (12/31/2021)  Financial Resource Strain: High Risk (10/26/2020)   Received from [facility]  Social Connections: Unknown (09/25/2023)  Tobacco Use: Low Risk  (09/27/2023)    Readmission Risk Interventions    06/07/2023   11:17 AM 11/08/2021   11:09 AM  Readmission Risk Prevention Plan  Transportation Screening Complete Complete  Medication Review Oceanographer) Complete Complete  PCP or Specialist appointment within 3-5 days of discharge Complete Complete  HRI or Home Care Consult Complete   SW Recovery Care/Counseling Consult Complete Complete  Palliative Care Screening Not Applicable Not Applicable  Skilled Nursing Facility Complete Complete

## 2023-10-09 DIAGNOSIS — M86179 Other acute osteomyelitis, unspecified ankle and foot: Secondary | ICD-10-CM | POA: Diagnosis not present

## 2023-10-09 LAB — GLUCOSE, CAPILLARY
Glucose-Capillary: 142 mg/dL — ABNORMAL HIGH (ref 70–99)
Glucose-Capillary: 144 mg/dL — ABNORMAL HIGH (ref 70–99)
Glucose-Capillary: 152 mg/dL — ABNORMAL HIGH (ref 70–99)
Glucose-Capillary: 153 mg/dL — ABNORMAL HIGH (ref 70–99)

## 2023-10-09 NOTE — Plan of Care (Signed)

## 2023-10-09 NOTE — Telephone Encounter (Signed)
 Nurse spoke with patient who is currently at [facility] s/p left foot amputation. He is requesting assistance with obtaining a wheelchair ramp for his home.

## 2023-10-09 NOTE — Progress Notes (Signed)
 [facility] PROGRESS NOTE    Progress Note  [name]  FMW:[id number] DOB: [birth year] DOA: 09/25/2023 PCP: Celinda Chrystal HERO, MD     Brief Narrative:   Steve Alvarado is an 68 y.o. 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  Assessment/Plan:   Acute osteomyelitis of calcaneum, unspecified laterality (HCC)/Osteomyelitis of left foot (HCC) MRI was suggestive of osteomyelitis. Orthopedic surgery was consulted he status post below-knee amputation. Antibiotics were discontinued now. PT evaluated the patient, he is awaiting skilled nursing facility placement. Wound VAC still in place due to to remove prior to discharge. Will need to follow-up with Dr. Harden as an outpatient. Patient daughter is refusing [facility] and requested new facility versus discharge home with home health.  Social worker will helping with disposition. Patient medically stable awaiting placement to skilled nursing facility.  Essential hypertension: Continue Coreg , Entresto  and amlodipine . Blood pressure is improved this morning.  History of CVA: Continue aspirin  and Plavix . Will need skilled nursing facility placement.  Hypothyroidism:  Continue Synthroid .  Acute kidney injury on chronic kidney disease stage IIIa: Creatinine returned to baseline.  Chronic pain syndrome: Continue Lyrica .  Normocytic anemia/acute blood loss anemia: Likely due to acute blood loss from amputation. Hemoglobin ranging around 8.  HFpEF: Continue Coreg  Entresto  and Lasix .  Insomnia: Continue trazodone .  Toxic metabolic encephalopathy: Likely due to  medications. Baclofen  was held his mentation improved. CT showed no acute findings.  Try to minimize narcotics as much as possible.  Constipation: Continue MiraLAX  p.o. twice daily   Stage I sacral decubitus ulcer present on admission: RN Pressure Injury Documentation: Pressure Injury 06/06/23 Buttocks Right Stage 1 -  Intact skin with non-blanchable redness of a localized area usually over a bony prominence. stage 2 (Active)  06/06/23 1600  Location: Buttocks  Location Orientation: Right  Staging: Stage 1 -  Intact skin with non-blanchable redness of a localized area usually over a bony prominence.  Wound Description (Comments): stage 2  DO NOT USE:  Present on Admission: Yes  Dressing Type Foam - Lift dressing to assess site every shift 10/06/23 1100     DVT prophylaxis: lovenox  Family Communication:none Status is: Inpatient Remains inpatient appropriate because: Acute osteomyelitis    Code Status:     Code Status Orders  (From admission, onward)           Start     Ordered   09/25/23 1853  Full code  Continuous       Question:  By:  Answer:  Consent: discussion documented in EHR   09/25/23 1853           Code Status History     Date Active Date Inactive Code Status Order ID Comments User Context   06/05/2023 2034 06/07/2023 2119 Full Code [id number]  Charlton Evalene RAMAN, MD ED   03/26/2023 2111 03/28/2023 2147 Full Code [id number]  Lee Kingfisher, MD ED   03/26/2023 2047 03/26/2023 2110 Full Code [id number]  Lee Kingfisher, MD ED   08/28/2022 0341 09/01/2022 1829 Full Code [id number]  Cleatus Delayne GAILS, MD ED   07/26/2022 0152 07/28/2022 2107 Full Code [id number]  Mansy, Madison LABOR, MD ED   11/07/2021 2314 11/09/2021 2119 Full Code [id number]  Cleatus Delayne GAILS, MD ED   08/20/2021 0523 08/25/2021 1950 Full Code [id number]  Kenard Zachary PARAS, MD ED   08/12/2021 1210 08/18/2021 1956 Full Code [id number]  Waddell Karna LABOR, NP ED   07/09/2021 0044 07/12/2021 1648 Full Code [id number]  Mansy, Madison LABOR, MD ED    10/21/2020 1230 10/23/2020 1901 Full Code [id number]  Lanetta Lingo, MD ED   09/04/2020 1948 09/05/2020 2130 Full Code [id number]  Lang Dover, MD ED   09/02/2020 0443 09/03/2020 1543 Full Code [id number]  Robinette Vermell PARAS, MD ED   08/06/2020 0413 08/10/2020 2119 Full Code [id number]  Mansy, Madison LABOR, MD ED   03/08/2015 2247 03/10/2015 1433 Full Code [id number]  Oleh, Alm POUR, MD ED   03/07/2015 0803 03/07/2015 1805 Full Code [id number]  Stephania Ozell RAMAN Inpatient         IV Access:   Peripheral IV   Procedures and diagnostic studies:   No results found.   Medical Consultants:   None.   Subjective:    Steve Alvarado no complaints  Objective:    Vitals:   10/08/23 0442 10/08/23 0718 10/08/23 1423 10/09/23 0752  BP: (!) 146/69 (!) 145/67 (!) 141/69 (!) 149/72  Pulse: 69 74 63 66  Resp: 17 17 17 16   Temp: 98.6 F (37 C) 98 F (36.7 C) 98.6 F (37 C) 98.8 F (37.1 C)  TempSrc: Oral Oral    SpO2: 98% 100% 97% 97%  Weight:      Height:       SpO2: 97 % O2 Flow Rate (L/min): 6 L/min   Intake/Output Summary (Last 24 hours) at 10/09/2023 0818 Last data filed at 10/08/2023 1700 Gross per 24 hour  Intake 720 ml  Output --  Net 720 ml   Filed Weights   09/25/23 2302  Weight: 101.4 kg    Exam: General exam: In no acute distress. Respiratory system: Good air movement and clear to auscultation. Cardiovascular system: S1 & S2 heard, RRR. No JVD. Gastrointestinal system: Abdomen is nondistended, soft and nontender.  Extremities: No pedal edema. Skin: No rashes, lesions or ulcers Psychiatry: Judgement and insight appear normal. Mood & affect appropriate.    Data Reviewed:    Labs: Basic Metabolic Panel: Recent Labs  Lab 10/03/23 0610 10/06/23 0506  NA 134* 138  K 4.0 3.9  CL 104 106  CO2 18* 21*  GLUCOSE 122* 145*  BUN 23 16  CREATININE 1.24 1.07  CALCIUM  8.6* 8.8*   GFR Estimated Creatinine Clearance: 82.9 mL/min (by C-G formula based on SCr of  1.07 mg/dL). Liver Function Tests: No results for input(s): AST, ALT, ALKPHOS, BILITOT, PROT, ALBUMIN in the last 168 hours. No results for input(s): LIPASE, AMYLASE in the last 168 hours. No results for input(s): AMMONIA in the last 168 hours. Coagulation profile No results for input(s): INR, PROTIME in the last 168 hours. COVID-19 Labs  No results for input(s): DDIMER, FERRITIN, LDH, CRP in the last 72 hours.  Lab Results  Component Value Date   SARSCOV2NAA NEGATIVE 06/05/2023   SARSCOV2NAA NEGATIVE 04/07/2023   SARSCOV2NAA NEGATIVE 03/26/2023   SARSCOV2NAA NEGATIVE 04/26/2022    CBC: Recent Labs  Lab 10/03/23 0610 10/06/23 0506  WBC 7.1 9.1  HGB 8.3* 8.5*  HCT 24.7* 26.1*  MCV 87.0 87.3  PLT 326 376   Cardiac Enzymes: No results for input(s): CKTOTAL, CKMB, CKMBINDEX, TROPONINI in the last 168 hours. BNP (last 3 results) No results for input(s): PROBNP in the last 8760 hours.  CBG: Recent Labs  Lab 10/08/23 0623 10/08/23 1139 10/08/23 1636 10/08/23 2125 10/09/23 0613  GLUCAP 144* 165* 110* 145* 142*   D-Dimer: No results for input(s): DDIMER in the last 72 hours. Hgb A1c: No results for input(s): HGBA1C in the last 72 hours. Lipid Profile: No results for input(s): CHOL, HDL, LDLCALC, TRIG, CHOLHDL, LDLDIRECT in the last 72 hours. Thyroid  function studies: No results for input(s): TSH, T4TOTAL, T3FREE, THYROIDAB in the last 72 hours.  Invalid input(s): FREET3 Anemia work up: No results for input(s): VITAMINB12, FOLATE, FERRITIN, TIBC, IRON, RETICCTPCT in the last 72 hours. Sepsis Labs: Recent Labs  Lab 10/03/23 0610 10/06/23 0506  WBC 7.1 9.1   Microbiology No results found for this or any previous visit (from the past 240 hours).    Medications:    amLODipine   10 mg Oral q morning   aspirin  EC  81 mg Oral Daily   atorvastatin   40 mg Oral QPM   carvedilol   12.5 mg  Oral BID WC   clopidogrel   75 mg Oral Daily   docusate sodium   100 mg Oral Daily   DULoxetine   60 mg Oral Daily   furosemide   20 mg Oral QODAY   heparin  injection (subcutaneous)  5,000 Units Subcutaneous Q8H   insulin  aspart  0-24 Units Subcutaneous TID WC   levothyroxine   75 mcg Oral QHS   pantoprazole   40 mg Oral Daily   pneumococcal 20-valent conjugate vaccine  0.5 mL Intramuscular Tomorrow-1000   pregabalin   75 mg Oral BID   sacubitril -valsartan   1 tablet Oral BID   senna-docusate  1 tablet Oral BID   sodium bicarbonate   650 mg Oral TID   sodium chloride  flush  3 mL Intravenous Q12H   Continuous Infusions:    LOS: 14 days   Erle Odell Castor  [facility]  10/09/2023, 8:18 AM

## 2023-10-09 NOTE — Progress Notes (Signed)
 Physical Therapy Treatment Patient Details Name: Steve Alvarado MRN: [id number] DOB: [birth year] Today's Date: 10/09/2023   History of Present Illness Steve Alvarado is a 68 yo male who presented from SNF with L heel pain. 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 Pt demonstrating improved initiation and sequencing to complete bed mobility with min A +2. Pt able to come to stand in stedy frame x3 with max A +2 to power up with improved weight shift to R with pt able to maintain with mod A +2 ~20 seconds. Continued education on importance of maintaining L knee extension with pt able to perform x5 quad sets with pt verbalizing understanding to complete throughout day. Pt up in chair at end of session with all needs met. Pt continues to benefit from skilled PT services to progress toward functional mobility goals.      If plan is discharge home, recommend the following: Two people to help with walking and/or transfers;Two people to help with bathing/dressing/bathroom;Assistance with cooking/housework;Assist for transportation;Help with stairs or ramp for entrance   Can travel by private vehicle        Equipment Recommendations  Other (comment) (if going home will need hospital bed, wheelchair, hoyer)    Recommendations for Other Services       Precautions / Restrictions Precautions Precautions: Fall Recall of Precautions/Restrictions: Impaired Required Braces or Orthoses: Splint/Cast;Other Brace Splint/Cast: L resting hand splint, L limb protector Other Brace: L limb protector Restrictions Weight Bearing Restrictions Per Provider Order: No LLE Weight Bearing Per Provider Order: Non weight bearing Other  Position/Activity Restrictions: wound vac     Mobility  Bed Mobility Overal bed mobility: Needs Assistance Bed Mobility: Supine to Sit     Supine to sit: HOB elevated, Min assist, Used rails, +2 for physical assistance     General bed mobility comments: light cues for sequncing, able to self mobilize LEs to EOB and begin to elevate trunk to sitting, min A +2 to scoot around with use of bed pad and to elevate trunk to full upright sitting    Transfers Overall transfer level: Needs assistance Equipment used: Ambulation equipment used, Sliding board Transfers: Sit to/from Stand Sit to Stand: +2 physical assistance, +2 safety/equipment, Via lift equipment, Mod assist           General transfer comment: max A +2 to boost to stand from elevated EOV with RUE on stedy rail, improved power up and able to shift weight to R, mod A +2 to maintain stansing in stedy frame, able to compelte x3 during session Transfer via Lift Equipment: Stedy  Ambulation/Gait               General Gait Details: unable at baseline   Stairs             Wheelchair Mobility     Tilt Bed    Modified Rankin (Stroke Patients Only)       Balance Overall balance assessment: Needs assistance Sitting-balance support: Feet supported, No upper extremity supported Sitting balance-Leahy Scale: Good     Standing balance support: Single extremity supported, During functional activity Standing balance-Leahy Scale: Poor Standing balance comment: mod A +2 to maintain in stedy frame  Communication Communication Communication: No apparent difficulties Factors Affecting Communication: Reduced clarity of speech (doesn't have dentures in)  Cognition Arousal: Alert Behavior During Therapy: WFL for tasks assessed/performed   PT - Cognitive impairments: Sequencing, Problem solving                         Following commands: Impaired Following commands  impaired: Follows one step commands inconsistently    Cueing Cueing Techniques: Verbal cues, Visual cues, Tactile cues  Exercises Amputee Exercises Quad Sets: AROM, Left, 5 reps, Seated (long sitting in chair)    General Comments        Pertinent Vitals/Pain Pain Assessment Pain Assessment: Faces Faces Pain Scale: Hurts a little bit Pain Location: bottom Pain Descriptors / Indicators: Grimacing, Aching, Guarding Pain Intervention(s): Monitored during session, Limited activity within patient's tolerance, Repositioned    Home Living                          Prior Function            PT Goals (current goals can now be found in the care plan section) Acute Rehab PT Goals PT Goal Formulation: With patient/family Time For Goal Achievement: 10/12/23    Frequency    Min 2X/week      PT Plan      Co-evaluation              AM-PAC PT 6 Clicks Mobility   Outcome Measure  Help needed turning from your back to your side while in a flat bed without using bedrails?: A Lot Help needed moving from lying on your back to sitting on the side of a flat bed without using bedrails?: A Lot Help needed moving to and from a bed to a chair (including a wheelchair)?: Total Help needed standing up from a chair using your arms (e.g., wheelchair or bedside chair)?: Total Help needed to walk in hospital room?: Total Help needed climbing 3-5 steps with a railing? : Total 6 Click Score: 8    End of Session Equipment Utilized During Treatment: Gait belt;Other (comment) (L limb protector) Activity Tolerance: Patient tolerated treatment well Patient left: with call bell/phone within reach;in chair Nurse Communication: Mobility status PT Visit Diagnosis: Muscle weakness (generalized) (M62.81);Pain;Other abnormalities of gait and mobility (R26.89) Pain - Right/Left: Left Pain - part of body: Leg     Time: 8498-8481 PT Time Calculation (min) (ACUTE ONLY): 17  min  Charges:    $Therapeutic Activity: 8-22 mins PT General Charges $$ ACUTE PT VISIT: 1 Visit                     Tinita Brooker R. PTA Acute Rehabilitation Services Office: [phone]   Therisa CHRISTELLA Boor 10/09/2023, 4:13 PM

## 2023-10-09 NOTE — Telephone Encounter (Signed)
 Patient states he missed a call from Dr. Celinda and would like to know If she can call back. Best call back # [phone]

## 2023-10-10 DIAGNOSIS — M86179 Other acute osteomyelitis, unspecified ankle and foot: Secondary | ICD-10-CM | POA: Diagnosis not present

## 2023-10-10 LAB — GLUCOSE, CAPILLARY
Glucose-Capillary: 178 mg/dL — ABNORMAL HIGH (ref 70–99)
Glucose-Capillary: 149 mg/dL — ABNORMAL HIGH (ref 70–99)
Glucose-Capillary: 175 mg/dL — ABNORMAL HIGH (ref 70–99)
Glucose-Capillary: 140 mg/dL — ABNORMAL HIGH (ref 70–99)
Glucose-Capillary: 157 mg/dL — ABNORMAL HIGH (ref 70–99)

## 2023-10-10 NOTE — TOC Progression Note (Addendum)
 Transition of Care [facility]) - Progression Note    Patient Details  Name: Steve Alvarado MRN: [id number] Date of Birth: 1955-07-09  Transition of Care [facility]) CM/SW Contact  Rosalva Jon Bloch, RN Phone Number: 10/10/2023, 10:15 AM  Clinical Narrative:    NCM  F/U with Damian @ [facility] regarding potential bed offer. Damian [phone] CELL# [phone]) requested NCM to resubmit clinicals in hub  for reviewing. Clinicals sent .  Damian states will f/u with NCM today...  Olam [phone]), admission liaison @ [facility] states they need to get financial information from pt before making bed offer. Pt cell # provided to [facility] business office to f/u with pt.   10/10/2023 @ 1609 Shanna with [facility] stated they will have an answer regarding bed offer in the am.  [facility] team following and will continue assisting with needs.  Expected Discharge Plan: Skilled Nursing Facility Barriers to Discharge: Other (must enter comment) (awaiting SNF bed offer)  Expected Discharge Plan and Services In-house Referral: Clinical Social Work   Post Acute Care Choice: Skilled Nursing Facility Living arrangements for the past 2 months: Skilled Nursing Facility [facility])                                       Social Determinants of Health (SDOH) Interventions SDOH Screenings   Food Insecurity: No Food Insecurity (09/25/2023)  Housing: Low Risk  (09/25/2023)  Transportation Needs: No Transportation Needs (09/25/2023)  Utilities: Not At Risk (09/25/2023)  Depression (PHQ2-9): Low Risk  (12/31/2021)  Financial Resource Strain: High Risk (10/26/2020)   Received from [facility]  Social Connections: Unknown (09/25/2023)  Tobacco Use: Low Risk  (09/27/2023)    Readmission Risk Interventions    06/07/2023   11:17 AM 11/08/2021   11:09 AM  Readmission Risk Prevention Plan  Transportation Screening Complete Complete  Medication Review (RN Care Manager) Complete  Complete  PCP or Specialist appointment within 3-5 days of discharge Complete Complete  HRI or Home Care Consult Complete   SW Recovery Care/Counseling Consult Complete Complete  Palliative Care Screening Not Applicable Not Applicable  Skilled Nursing Facility Complete Complete

## 2023-10-10 NOTE — Progress Notes (Signed)
 [facility] PROGRESS NOTE    Progress Note  Steve Alvarado  FMW:[id number] DOB: 1955/06/18 DOA: 09/25/2023 PCP: Celinda Chrystal HERO, MD     Brief Narrative:   Steve Alvarado is an 68 y.o. 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  Assessment/Plan:   Acute osteomyelitis of calcaneum, unspecified laterality (HCC)/Osteomyelitis of left foot (HCC) MRI was suggestive of osteomyelitis. Orthopedic surgery was consulted he status post below-knee amputation. Antibiotics were discontinued now. PT evaluated the patient, he is awaiting skilled nursing facility placement. Wound VAC still in place due to to remove prior to discharge. Will need to follow-up with Dr. Harden as an outpatient. Patient daughter is refusing [facility] and requested new facility versus discharge home with home health.  Social worker will helping with disposition. Patient medically stable awaiting placement to skilled nursing facility.  Essential hypertension: Continue Coreg , Entresto  and amlodipine . Blood pressure is improved this morning.  History of CVA: Continue aspirin  and Plavix . Will need skilled nursing facility placement.  Hypothyroidism:  Continue Synthroid .  Acute kidney injury on chronic kidney disease stage IIIa: Creatinine returned to baseline.  Chronic pain syndrome: Continue Lyrica .  Normocytic anemia/acute blood loss anemia: Likely due to acute blood loss from amputation. Hemoglobin ranging around 8.  HFpEF: Continue Coreg  Entresto  and Lasix .  Insomnia: Continue trazodone .  Toxic metabolic encephalopathy: Likely due to  medications. Baclofen  was held his mentation improved. CT showed no acute findings.  Try to minimize narcotics as much as possible.  Constipation: Continue MiraLAX  p.o. twice daily   Stage I sacral decubitus ulcer present on admission: RN Pressure Injury Documentation: Pressure Injury 06/06/23 Buttocks Right Stage 2 -  Partial thickness loss of dermis presenting as a shallow open injury with a red, pink wound bed without slough. stage 2 (Active)  06/06/23 1600  Location: Buttocks  Location Orientation: Right  Staging: Stage 2 -  Partial thickness loss of dermis presenting as a shallow open injury with a red, pink wound bed without slough.  Wound Description (Comments): stage 2  DO NOT USE:  Present on Admission: Yes  Dressing Type Foam - Lift dressing to assess site every shift 10/09/23 2115     DVT prophylaxis: lovenox  Family Communication:none Status is: Inpatient Remains inpatient appropriate because: Acute osteomyelitis    Code Status:     Code Status Orders  (From admission, onward)           Start     Ordered   09/25/23 1853  Full code  Continuous       Question:  By:  Answer:  Consent: discussion documented in EHR   09/25/23 1853           Code Status History     Date Active Date Inactive Code Status Order ID Comments User Context   06/05/2023 2034 06/07/2023 2119 Full Code [id number]  Charlton Evalene RAMAN, MD ED   03/26/2023 2111 03/28/2023 2147 Full Code [id number]  Lee Kingfisher, MD ED   03/26/2023 2047 03/26/2023 2110 Full Code [id number]  Lee Kingfisher, MD ED   08/28/2022 0341 09/01/2022 1829 Full Code [id number]  Cleatus Delayne GAILS, MD ED   07/26/2022 0152 07/28/2022 2107 Full Code [id number]  Mansy, Madison LABOR, MD  ED   11/07/2021 2314 11/09/2021 2119 Full Code [id number]  Cleatus Delayne GAILS, MD ED   08/20/2021 0523 08/25/2021 1950 Full Code [id number]  Kenard Zachary PARAS, MD ED   08/12/2021 1210 08/18/2021 1956 Full Code [id number]  Waddell Karna LABOR, NP ED   07/09/2021 0044 07/12/2021 1648  Full Code [id number]  Mansy, Madison LABOR, MD ED   10/21/2020 1230 10/23/2020 1901 Full Code [id number]  Lanetta Lingo, MD ED   09/04/2020 1948 09/05/2020 2130 Full Code [id number]  Lang Dover, MD ED   09/02/2020 0443 09/03/2020 1543 Full Code [id number]  Robinette Vermell PARAS, MD ED   08/06/2020 0413 08/10/2020 2119 Full Code [id number]  Mansy, Madison LABOR, MD ED   03/08/2015 2247 03/10/2015 1433 Full Code [id number]  Oleh, Alm POUR, MD ED   03/07/2015 0803 03/07/2015 1805 Full Code [id number]  Stephania Ozell RAMAN Inpatient         IV Access:   Peripheral IV   Procedures and diagnostic studies:   No results found.   Medical Consultants:   None.   Subjective:    Steve Alvarado no complaints  Objective:    Vitals:   10/09/23 0752 10/09/23 1605 10/09/23 1954 10/10/23 0738  BP: (!) 149/72 (!) 158/72 (!) 161/73 138/63  Pulse: 66 (!) 59 61 69  Resp: 16 16 19 18   Temp: 98.8 F (37.1 C) 98.5 F (36.9 C) 98.1 F (36.7 C) 98.9 F (37.2 C)  TempSrc: Oral Oral Oral   SpO2: 97% 98% 97% 99%  Weight:      Height:       SpO2: 99 % O2 Flow Rate (L/min): 6 L/min   Intake/Output Summary (Last 24 hours) at 10/10/2023 0839 Last data filed at 10/09/2023 1000 Gross per 24 hour  Intake 120 ml  Output --  Net 120 ml   Filed Weights   09/25/23 2302  Weight: 101.4 kg    Exam: General exam: In no acute distress. Respiratory system: Good air movement and clear to auscultation. Cardiovascular system: S1 & S2 heard, RRR. No JVD. Gastrointestinal system: Abdomen is nondistended, soft and nontender.  Extremities: No pedal edema. Skin: No rashes, lesions or ulcers Psychiatry: Judgement and insight appear normal. Mood & affect appropriate.    Data Reviewed:    Labs: Basic Metabolic Panel: Recent Labs  Lab 10/06/23 0506  NA 138  K 3.9  CL 106  CO2 21*  GLUCOSE 145*  BUN 16  CREATININE 1.07  CALCIUM  8.8*   GFR Estimated Creatinine Clearance: 82.9 mL/min (by C-G formula based on SCr of  1.07 mg/dL). Liver Function Tests: No results for input(s): AST, ALT, ALKPHOS, BILITOT, PROT, ALBUMIN in the last 168 hours. No results for input(s): LIPASE, AMYLASE in the last 168 hours. No results for input(s): AMMONIA in the last 168 hours. Coagulation profile No results for input(s): INR, PROTIME in the last 168 hours. COVID-19 Labs  No results for input(s): DDIMER, FERRITIN, LDH, CRP in the last 72 hours.  Lab Results  Component Value Date   SARSCOV2NAA NEGATIVE 06/05/2023   SARSCOV2NAA NEGATIVE 04/07/2023   SARSCOV2NAA NEGATIVE 03/26/2023   SARSCOV2NAA NEGATIVE 04/26/2022    CBC: Recent Labs  Lab 10/06/23 0506  WBC 9.1  HGB 8.5*  HCT 26.1*  MCV 87.3  PLT 376   Cardiac Enzymes: No results for input(s): CKTOTAL, CKMB, CKMBINDEX, TROPONINI in the last 168 hours. BNP (last 3 results) No results for input(s): PROBNP in the last 8760 hours. CBG: Recent Labs  Lab 10/09/23  1130 10/09/23 1659 10/09/23 2012 10/10/23 0602 10/10/23 0735  GLUCAP 144* 152* 153* 178* 149*   D-Dimer: No results for input(s): DDIMER in the last 72 hours. Hgb A1c: No results for input(s): HGBA1C in the last 72 hours. Lipid Profile: No results for input(s): CHOL, HDL, LDLCALC, TRIG, CHOLHDL, LDLDIRECT in the last 72 hours. Thyroid  function studies: No results for input(s): TSH, T4TOTAL, T3FREE, THYROIDAB in the last 72 hours.  Invalid input(s): FREET3 Anemia work up: No results for input(s): VITAMINB12, FOLATE, FERRITIN, TIBC, IRON, RETICCTPCT in the last 72 hours. Sepsis Labs: Recent Labs  Lab 10/06/23 0506  WBC 9.1   Microbiology No results found for this or any previous visit (from the past 240 hours).    Medications:    amLODipine   10 mg Oral q morning   aspirin  EC  81 mg Oral Daily   atorvastatin   40 mg Oral QPM   carvedilol   12.5 mg Oral BID WC   clopidogrel   75 mg Oral Daily   docusate  sodium  100 mg Oral Daily   DULoxetine   60 mg Oral Daily   furosemide   20 mg Oral QODAY   heparin  injection (subcutaneous)  5,000 Units Subcutaneous Q8H   insulin  aspart  0-24 Units Subcutaneous TID WC   levothyroxine   75 mcg Oral QHS   pantoprazole   40 mg Oral Daily   pneumococcal 20-valent conjugate vaccine  0.5 mL Intramuscular Tomorrow-1000   pregabalin   75 mg Oral BID   sacubitril -valsartan   1 tablet Oral BID   senna-docusate  1 tablet Oral BID   sodium bicarbonate   650 mg Oral TID   sodium chloride  flush  3 mL Intravenous Q12H   Continuous Infusions:    LOS: 15 days   Erle Odell Castor  [facility]  10/10/2023, 8:39 AM

## 2023-10-11 DIAGNOSIS — M86179 Other acute osteomyelitis, unspecified ankle and foot: Secondary | ICD-10-CM | POA: Diagnosis not present

## 2023-10-11 LAB — GLUCOSE, CAPILLARY
Glucose-Capillary: 162 mg/dL — ABNORMAL HIGH (ref 70–99)
Glucose-Capillary: 217 mg/dL — ABNORMAL HIGH (ref 70–99)
Glucose-Capillary: 125 mg/dL — ABNORMAL HIGH (ref 70–99)
Glucose-Capillary: 165 mg/dL — ABNORMAL HIGH (ref 70–99)

## 2023-10-11 LAB — BASIC METABOLIC PANEL WITH GFR
Anion gap: 10 (ref 5–15)
BUN: 13 mg/dL (ref 8–23)
CO2: 24 mmol/L (ref 22–32)
Calcium: 8.9 mg/dL (ref 8.9–10.3)
Chloride: 102 mmol/L (ref 98–111)
Creatinine, Ser: 1.08 mg/dL (ref 0.61–1.24)
GFR, Estimated: >60 mL/min (ref >60)
Glucose, Bld: 156 mg/dL — ABNORMAL HIGH (ref 70–99)
Potassium: 3.9 mmol/L (ref 3.5–5.1)
Sodium: 136 mmol/L (ref 135–145)

## 2023-10-11 LAB — CBC
HCT: 27.8 % — ABNORMAL LOW (ref 39.0–52.0)
Hemoglobin: 9.2 g/dL — ABNORMAL LOW (ref 13.0–17.0)
MCH: 28.5 pg (ref 26.0–34.0)
MCHC: 33.1 g/dL (ref 30.0–36.0)
MCV: 86.1 fL (ref 80.0–100.0)
Platelets: 427 K/uL — ABNORMAL HIGH (ref 150–400)
RBC: 3.23 MIL/uL — ABNORMAL LOW (ref 4.22–5.81)
RDW: 13.2 % (ref 11.5–15.5)
WBC: 7 K/uL (ref 4.0–10.5)
nRBC: 0 % (ref 0.0–0.2)

## 2023-10-11 LAB — HEMOGLOBIN A1C
Hgb A1c MFr Bld: 8.1 % — ABNORMAL HIGH (ref 4.8–5.6)
Mean Plasma Glucose: 185.77 mg/dL

## 2023-10-11 MED ORDER — INSULIN ASPART 100 UNIT/ML IJ SOLN: 0.0000 [IU] | Freq: Every day | INTRAMUSCULAR | Status: DC

## 2023-10-11 MED ORDER — INSULIN ASPART 100 UNIT/ML IJ SOLN
0.0000 [IU] | Freq: Three times a day (TID) | INTRAMUSCULAR | Status: DC
  Administered 2023-10-11: 5 [IU] via SUBCUTANEOUS
  Administered 2023-10-11 – 2023-10-12 (×2): 2 [IU] via SUBCUTANEOUS
  Administered 2023-10-12: 3 [IU] via SUBCUTANEOUS

## 2023-10-11 MED ORDER — ACETAMINOPHEN 500 MG PO TABS
1000.0000 mg | ORAL_TABLET | Freq: Three times a day (TID) | ORAL | Status: DC
  Administered 2023-10-11: 1000 mg via ORAL
  Filled 2023-10-11 (×2): qty 2

## 2023-10-11 NOTE — TOC Progression Note (Signed)
 Transition of Care [facility]) - Progression Note    Patient Details  Name: Steve Alvarado MRN: [id number] Date of Birth: 01/13/1956  [facility]) CM/SW Contact  Rosalva Jon Bloch, RN Phone Number: 10/11/2023, 4:08 PM  Clinical Narrative:    NCM called Olam [phone]), admission liaison @ [facility] regarding bed acceptance. Voice message left, awaiting call back. [facility] unable to make bed offer per Damian, Brewing technologist.  Expected Discharge Plan: Skilled Nursing Facility Barriers to Discharge: Other (must enter comment) (awaiting bed availability)                  Expected Discharge Plan and Services In-house Referral: Clinical Social Work   Post Acute Care Choice: Skilled Nursing Facility Living arrangements for the past 2 months: Skilled Nursing Facility [facility])                                       Social Drivers of Health (SDOH) Interventions SDOH Screenings   Food Insecurity: No Food Insecurity (09/25/2023)  Housing: Low Risk  (09/25/2023)  Transportation Needs: No Transportation Needs (09/25/2023)  Utilities: Not At Risk (09/25/2023)  Depression (PHQ2-9): Low Risk  (12/31/2021)  Financial Resource Strain: High Risk (10/26/2020)   [facility]  Social Connections: Unknown (09/25/2023)  Tobacco Use: Low Risk  (09/27/2023)    Readmission Risk Interventions    06/07/2023   11:17 AM 11/08/2021   11:09 AM  Readmission Risk Prevention Plan  Transportation Screening Complete Complete  Medication Review Oceanographer) Complete Complete  PCP or Specialist appointment within 3-5 days of discharge Complete Complete  HRI or Home Care Consult Complete   SW Recovery Care/Counseling Consult Complete Complete  Palliative Care Screening Not Applicable Not Applicable  Skilled Nursing Facility Complete Complete

## 2023-10-11 NOTE — Progress Notes (Signed)
 Physical Therapy Treatment Patient Details Name: Steve Alvarado MRN: [id number] DOB: March 01, 1956 Today's Date: 10/11/2023   History of Present Illness Steve Alvarado is a 68 yo male who presented from SNF with L heel pain. 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 Pt with improved sequencing to come to sit EOB, utilizing bed features pt able to complete with min A to fully elevate trunk due to L hemi-weakness. Pt performing x2 sit<>stands from EOB and chair with min A +2 needed to block R knee and assist with rise. Pt able to maintain standing balance with min A +2 and pivot on RLE to chair with mod A +2 to maintain balance as pt continues to fatigue quickly with standing mobility. Continued education on importance of resting with L knee in extension and performing quad sets throughout day with pt able to perform x10 with assist for increased extension. Rolled blanket placed under distal end of residual limb to encourage extension at end of session. Pt continues to benefit from skilled PT services to progress toward functional mobility goals.      If plan is discharge home, recommend the following: Two people to help with walking and/or transfers;Two people to help with bathing/dressing/bathroom;Assistance with cooking/housework;Assist for transportation;Help with stairs or ramp for entrance   Can travel by private vehicle        Equipment Recommendations  Other (comment) (if going home will need hospital bed, wheelchair, hoyer)    Recommendations for Other Services       Precautions / Restrictions Precautions Precautions: Fall Recall of Precautions/Restrictions: Impaired Required Braces or Orthoses: Splint/Cast;Other Brace Splint/Cast: L resting hand  splint, L limb protector Other Brace: L limb protector Restrictions Weight Bearing Restrictions Per Provider Order: No LLE Weight Bearing Per Provider Order: Weight bearing as tolerated Other Position/Activity Restrictions: wound vac     Mobility  Bed Mobility Overal bed mobility: Needs Assistance Bed Mobility: Supine to Sit     Supine to sit: HOB elevated, Min assist, Used rails     General bed mobility comments: light cues for sequncing, able to self mobilize LEs to EOB and begin to elevate trunk to sitting, min A to conplete trunk elevation, with increased time able to scoot out to EOB    Transfers Overall transfer level: Needs assistance Equipment used: None Transfers: Sit to/from Stand Sit to Stand: +2 physical assistance, Min assist Stand pivot transfers: +2 safety/equipment, +2 physical assistance, Mod assist         General transfer comment: min A +2 to stand from elevated EOB and chair with RLE blocked and cues for weight shift to R on rise, able to maintain standing with min A +2 with cues for upright trunk and tucking hips, pt able pivot on RLE with mod A to steady to transfer to chair    Ambulation/Gait               General Gait Details: unable at baseline   Stairs             Wheelchair Mobility     Tilt Bed    Modified Rankin (Stroke Patients Only)       Balance Overall balance assessment: Needs assistance Sitting-balance support: Feet supported, No upper extremity supported Sitting balance-Leahy Scale: Good Sitting balance -  Comments: improved sitting balance this date   Standing balance support: Single extremity supported, During functional activity Standing balance-Leahy Scale: Poor Standing balance comment: min  A +2 to maintain standing                            Communication Communication Communication: No apparent difficulties Factors Affecting Communication: Reduced clarity of speech (doesn't have dentures  in)  Cognition Arousal: Alert Behavior During Therapy: WFL for tasks assessed/performed   PT - Cognitive impairments: Sequencing, Problem solving                         Following commands: Impaired Following commands impaired: Follows one step commands inconsistently    Cueing Cueing Techniques: Verbal cues, Visual cues, Tactile cues  Exercises Amputee Exercises Quad Sets: AAROM, Left, 10 reps (long sitting in chair with rolled blanket under distal end residual limb)    General Comments        Pertinent Vitals/Pain Pain Assessment Pain Assessment: Faces Faces Pain Scale: Hurts a little bit Pain Location: L knee with extension Pain Descriptors / Indicators: Grimacing, Aching, Guarding Pain Intervention(s): Monitored during session, Limited activity within patient's tolerance, Repositioned    Home Living                          Prior Function            PT Goals (current goals can now be found in the care plan section) Acute Rehab PT Goals PT Goal Formulation: With patient/family Time For Goal Achievement: 10/12/23 Progress towards PT goals: Progressing toward goals    Frequency    Min 2X/week      PT Plan      Co-evaluation              AM-PAC PT 6 Clicks Mobility   Outcome Measure  Help needed turning from your back to your side while in a flat bed without using bedrails?: A Lot Help needed moving from lying on your back to sitting on the side of a flat bed without using bedrails?: A Lot Help needed moving to and from a bed to a chair (including a wheelchair)?: Total Help needed standing up from a chair using your arms (e.g., wheelchair or bedside chair)?: Total Help needed to walk in hospital room?: Total Help needed climbing 3-5 steps with a railing? : Total 6 Click Score: 8    End of Session Equipment Utilized During Treatment: Gait belt;Other (comment) (L limb protector) Activity Tolerance: Patient tolerated treatment  well Patient left: with call bell/phone within reach;in chair;with chair alarm set Nurse Communication: Mobility status PT Visit Diagnosis: Muscle weakness (generalized) (M62.81);Pain;Other abnormalities of gait and mobility (R26.89) Pain - Right/Left: Left Pain - part of body: Leg     Time: 9067-9045 PT Time Calculation (min) (ACUTE ONLY): 22 min  Charges:    $Therapeutic Activity: 8-22 mins PT General Charges $$ ACUTE PT VISIT: 1 Visit                     Steve Raynor R. PTA Acute Rehabilitation Services Office: [phone]   Steve Alvarado 10/11/2023, 10:32 AM

## 2023-10-11 NOTE — Hospital Course (Signed)
 Patient with PMH of CVA with residual hemiparesis, CAD, HLD, HTN, hypothyroidism, chronic HFpEF presented to the hospital with complaints of left heel pain.  Found to have calcaneus osteomyelitis.  Underwent left BKA.  Currently has a wound VAC. Assessment and Plan: Acute osteomyelitis of calcaneum, unspecified laterality /Osteomyelitis of left foot MRI was suggestive of osteomyelitis. Dr. Harden from orthopedic surgery was consulted, status post left below-knee amputation. PT evaluated the patient, he is awaiting skilled nursing facility placement. Wound VAC still in place due to to remove prior to discharge. Will need to follow-up with Dr. Harden as an outpatient. Patient medically stable awaiting placement to skilled nursing facility. Add scheduled Tylenol .   Essential hypertension: Continue Coreg , Entresto  and amlodipine . Blood pressure stable.   History of CVA: Continue aspirin  and Plavix . Will need skilled nursing facility placement.   Hypothyroidism:  Continue Synthroid .   Acute kidney injury on chronic kidney disease stage IIIa: Baseline creatinine 1.2 creatinine peaked at 2.29.  Now back to normal.   Chronic pain syndrome: Continue Lyrica .   Normocytic anemia/acute blood loss anemia: Likely due to acute blood loss from amputation. Hemoglobin ranging around 8.   Chronic HFpEF: Continue Coreg  Entresto  and Lasix .   Insomnia: Continue trazodone .   Toxic metabolic encephalopathy: Likely due to medications. Baclofen  was held his mentation improved. CT showed no acute findings.  Try to minimize narcotics as much as possible.   Constipation: Continue MiraLAX  p.o. twice daily  Stage I sacral decubitus ulcer present on admission: Continue foam dressing.

## 2023-10-11 NOTE — Progress Notes (Signed)
 [facility] Progress Note Patient: Steve Alvarado FMW:[id number] DOB: May 05, 1955 DOA: 09/25/2023  DOS: the patient was seen and examined on 10/11/2023  Brief Hospital Course: Patient with PMH of CVA with residual hemiparesis, CAD, HLD, HTN, hypothyroidism, chronic HFpEF presented to the hospital with complaints of left heel pain.  Found to have calcaneus osteomyelitis.  Underwent left BKA.  Currently has a wound VAC. Assessment and Plan: Acute osteomyelitis of calcaneum, unspecified laterality /Osteomyelitis of left foot MRI was suggestive of osteomyelitis. Dr. Harden from orthopedic surgery was consulted, status post left below-knee amputation. PT evaluated the patient, he is awaiting skilled nursing facility placement. Wound VAC still in place due to to remove prior to discharge. Will need to follow-up with Dr. Harden as an outpatient. Patient medically stable awaiting placement to skilled nursing facility. Add scheduled Tylenol .   Essential hypertension: Continue Coreg , Entresto  and amlodipine . Blood pressure stable.   History of CVA: Continue aspirin  and Plavix . Will need skilled nursing facility placement.   Hypothyroidism:  Continue Synthroid .   Acute kidney injury on chronic kidney disease stage IIIa: Baseline creatinine 1.2 creatinine peaked at 2.29.  Now back to normal.   Chronic pain syndrome: Continue Lyrica .   Normocytic anemia/acute blood loss anemia: Likely due to acute blood loss from amputation. Hemoglobin ranging around 8.   Chronic HFpEF: Continue Coreg  Entresto  and Lasix .   Insomnia: Continue trazodone .   Toxic metabolic encephalopathy: Likely due to medications. Baclofen  was held his mentation improved. CT showed no acute findings.  Try to minimize narcotics as much as possible.   Constipation: Continue MiraLAX  p.o. twice daily  Stage I sacral decubitus ulcer present on admission: Continue foam dressing.   Subjective: No nausea no vomiting no  fever no chills.  Reports pain is well-controlled during the day Unable to sleep at nighttime secondary to uncontrolled pain.  N Physical Exam: Trace edema. S1-S2 present Bowel sound present  Data Reviewed: I have Reviewed nursing notes, Vitals, and Lab results. Since last encounter, pertinent lab results CBC BMP   .  Disposition: Status is: Inpatient Remains inpatient appropriate because: Awaiting placement  heparin  injection 5,000 Units Start: 09/28/23 1400 SCD's Start: 09/27/23 1237   Family Communication: No one at bedside Level of care: Med-Surg   Vitals:   10/10/23 1953 10/11/23 0501 10/11/23 0719 10/11/23 1521  BP: 132/67 (!) 143/61 (!) 142/64 (!) 147/77  Pulse: 65 71 70 65  Resp: 17 16 16 16   Temp: 98.5 F (36.9 C) 98.2 F (36.8 C) (!) 97.5 F (36.4 C) 98.2 F (36.8 C)  TempSrc: Oral Oral Oral Oral  SpO2: 98% 94% 95% 99%  Weight:      Height:         Author: Yetta Blanch, MD 10/11/2023 6:36 PM  Please look on [email or web address] to find out who is on call.

## 2023-10-11 NOTE — Plan of Care (Signed)
  Problem: Education: Goal: Knowledge of General Education information will improve Description: Including pain rating scale, medication(s)/side effects and non-pharmacologic comfort measures Outcome: Progressing   Problem: Health Behavior/Discharge Planning: Goal: Ability to manage health-related needs will improve Outcome: Progressing   Problem: Clinical Measurements: Goal: Ability to maintain clinical measurements within normal limits will improve Outcome: Progressing   Problem: Activity: Goal: Risk for activity intolerance will decrease Outcome: Progressing   Problem: Elimination: Goal: Will not experience complications related to urinary retention Outcome: Progressing   Problem: Pain Managment: Goal: General experience of comfort will improve and/or be controlled Outcome: Progressing

## 2023-10-12 ENCOUNTER — Inpatient Hospital Stay (HOSPITAL_COMMUNITY)

## 2023-10-12 DIAGNOSIS — M86179 Other acute osteomyelitis, unspecified ankle and foot: Secondary | ICD-10-CM | POA: Diagnosis not present

## 2023-10-12 LAB — GLUCOSE, CAPILLARY
Glucose-Capillary: 164 mg/dL — ABNORMAL HIGH (ref 70–99)
Glucose-Capillary: 133 mg/dL — ABNORMAL HIGH (ref 70–99)

## 2023-10-12 MED ORDER — ENSURE PLUS HIGH PROTEIN PO LIQD: 237.0000 mL | Freq: Two times a day (BID) | ORAL | Status: DC

## 2023-10-12 MED ORDER — PREGABALIN 75 MG PO CAPS: 75.0000 mg | ORAL_CAPSULE | Freq: Two times a day (BID) | ORAL | 0 refills | Status: AC

## 2023-10-12 MED ORDER — GLUCERNA PO LIQD: 237.0000 mL | Freq: Two times a day (BID) | ORAL | Status: AC

## 2023-10-12 MED ORDER — FLEET ENEMA RE ENEM
1.0000 | ENEMA | Freq: Once | RECTAL | Status: AC
  Administered 2023-10-12: 1 via RECTAL
  Filled 2023-10-12: qty 1

## 2023-10-12 MED ORDER — ACETAMINOPHEN 500 MG PO TABS: 1000.0000 mg | ORAL_TABLET | Freq: Two times a day (BID) | ORAL | Status: AC

## 2023-10-12 MED ORDER — SIMETHICONE 80 MG PO CHEW
80.0000 mg | CHEWABLE_TABLET | Freq: Four times a day (QID) | ORAL | Status: DC
  Administered 2023-10-12: 80 mg via ORAL
  Filled 2023-10-12: qty 1

## 2023-10-12 MED ORDER — FUROSEMIDE 20 MG PO TABS: 20.0000 mg | ORAL_TABLET | ORAL | Status: AC

## 2023-10-12 MED ORDER — SIMETHICONE 80 MG PO CHEW: 80.0000 mg | CHEWABLE_TABLET | Freq: Four times a day (QID) | ORAL | 0 refills | Status: AC

## 2023-10-12 NOTE — Plan of Care (Signed)

## 2023-10-12 NOTE — TOC Progression Note (Signed)
 Transition of Care [facility]) - Progression Note    Patient Details  Name: Steve Alvarado MRN: [id number] Date of Birth: [birth year]  [facility] CM/SW Contact  Gwenn Frieze [place], [place] Phone Number: 10/12/2023, 9:03 AM  Clinical Narrative: Spoke to pt and [name] re LTC SNF bed offer from [facility]. Pt and dtr would like to accept bed and verbalized understanding pt will admit under [facility] benefits as [facility] denied SNF for STR. Confirmed with Olam at [facility] that pt has completed asset interview with their business office and they are prepared to admit today. MD updated.   Frieze Gwenn, MSW, LCSW [phone] (coverage)        Expected Discharge Plan: Skilled Nursing Facility Barriers to Discharge: Other (must enter comment) (awaiting bed availability)               Expected Discharge Plan and Services In-house Referral: Clinical Social Work   Post Acute Care Choice: Skilled Nursing Facility Living arrangements for the past 2 months: Skilled Nursing Facility [facility])                                       Social Drivers of Health (SDOH) Interventions SDOH Screenings   Food Insecurity: No Food Insecurity (09/25/2023)  Housing: Low Risk  (09/25/2023)  Transportation Needs: No Transportation Needs (09/25/2023)  Utilities: Not At Risk (09/25/2023)  Depression (PHQ2-9): Low Risk  (12/31/2021)  Financial Resource Strain: High Risk (10/26/2020)   Received from [facility]  Social Connections: Unknown (09/25/2023)  Tobacco Use: Low Risk  (09/27/2023)    Readmission Risk Interventions    06/07/2023   11:17 AM 11/08/2021   11:09 AM  Readmission Risk Prevention Plan  Transportation Screening Complete Complete  Medication Review Oceanographer) Complete Complete  PCP or Specialist appointment within 3-5 days of discharge Complete Complete  HRI or Home Care Consult Complete   SW Recovery Care/Counseling Consult Complete Complete   Palliative Care Screening Not Applicable Not Applicable  Skilled Nursing Facility Complete Complete

## 2023-10-12 NOTE — Progress Notes (Signed)
 This nurse removed the wound vac per providers order. Pt tolerated well. Minimal drainage, covered with dry gauze and ace wrap.

## 2023-10-12 NOTE — TOC Transition Note (Signed)
 Transition of Care [facility]) - Discharge Note   Patient Details  Name: Steve Alvarado MRN: [id number] Date of Birth: 03-Dec-1955  [facility]) CM/SW Contact:  Gwenn Julien [place], [place] Phone Number: 10/12/2023, 12:07 PM   Clinical Narrative: Pt for dc to [facility] today. Spoke to [place] in admissions who confirmed they are prepared to admit pt to room 209D. Pt and pt's dtr [name] aware of dc and report agreeable. RN provided with number for report and PTAR arranged for transport. SW signing off at dc.   Julien Gwenn, MSW, LCSW [phone] (coverage)        Final next level of care: Skilled Nursing Facility Barriers to Discharge: Barriers Resolved   Patient Goals and CMS Choice   CMS Medicare.gov Compare Post Acute Care list provided to:: Patient Choice offered to / list presented to : Patient, Adult Children      Discharge Placement              Patient chooses bed at: [facility] Patient to be transferred to facility by: [facility] Name of family member notified: [name]/dtr Patient and family notified of of transfer: 10/12/23  Discharge Plan and Services Additional resources added to the After Visit Summary for   In-house Referral: Clinical Social Work   Post Acute Care Choice: Skilled Nursing Facility                               Social Drivers of Health (SDOH) Interventions SDOH Screenings   Food Insecurity: No Food Insecurity (09/25/2023)  Housing: Low Risk  (09/25/2023)  Transportation Needs: No Transportation Needs (09/25/2023)  Utilities: Not At Risk (09/25/2023)  Depression (PHQ2-9): Low Risk  (12/31/2021)  Financial Resource Strain: High Risk (10/26/2020)   Received from [facility]  Social Connections: Unknown (09/25/2023)  Tobacco Use: Low Risk  (09/27/2023)     Readmission Risk Interventions    06/07/2023   11:17 AM 11/08/2021   11:09 AM  Readmission Risk Prevention Plan  Transportation Screening Complete Complete  Medication  Review Oceanographer) Complete Complete  PCP or Specialist appointment within 3-5 days of discharge Complete Complete  HRI or Home Care Consult Complete   SW Recovery Care/Counseling Consult Complete Complete  Palliative Care Screening Not Applicable Not Applicable  Skilled Nursing Facility Complete Complete

## 2023-10-12 NOTE — Consult Note (Signed)
 WOC consult requested to remove the Vac dressing, as ordered by Dr Harden of the ortho service.  This procedure can be performed by the bedside nurse and she had already removed when I arrived to the room.  No further role for WOC team. Please re-consult if further assistance is needed.  Thank-you,  Stephane Fought MSN, RN, CWOCN, CWCN-AP, CNS Contact Mon-Fri 0700-1500: [phone]

## 2023-10-12 NOTE — Discharge Summary (Signed)
 Physician Discharge Summary   Patient: Steve Alvarado MRN: [id number] DOB: 03-18-1956  Admit date:     09/25/2023  Discharge date: 10/12/23  Discharge Physician: Yetta Blanch  PCP: Celinda Chrystal HERO, MD  Recommendations at discharge: Follow up with Orthopedics as recommended  Follow up with PCP in  1 week  Repeat CBC and BMP in 1 week.    Contact information for follow-up providers     Harden Jerona GAILS, MD Follow up in 1 week(s).   Specialty: Orthopedic Surgery Contact information: [address] [place] [address] [phone]         Celinda Chrystal HERO, MD. Schedule an appointment as soon as possible for a visit in 2 week(s).   Specialty: Family Medicine Why: with BMP lab to look at kidney/electrolyte numbers, with CBC lab to look at blood counts Contact information: [address] [place] [address] [phone]              Contact information for after-discharge care     Destination     [facility] .   Service: Skilled Nursing Contact information: [address] [place]  [address] [phone]                    Discharge Diagnoses: Principal Problem:   Acute osteomyelitis of calcaneum, unspecified laterality (HCC) Active Problems:   Osteomyelitis of left foot (HCC)   Gangrene of left foot (HCC)   Pressure injury of left heel, stage 4 [facility])  Hospital Course: Patient with PMH of CVA with residual hemiparesis, CAD, HLD, HTN, hypothyroidism, chronic HFpEF presented to the hospital with complaints of left heel pain.  Found to have calcaneus osteomyelitis.  Underwent left BKA.  Currently has a wound VAC. Assessment and Plan: Acute osteomyelitis of calcaneum, unspecified laterality /Osteomyelitis of left foot MRI was suggestive of osteomyelitis. Dr. Harden from orthopedic surgery was consulted, status post left below-knee amputation. PT recommended SNF Had a Wound VAC, per recommendation from Orthopedics remove  prior to discharge. Will need to follow-up with Dr. Duda as an outpatient in 1 week Patient medically stable awaiting placement to skilled nursing facility. Add scheduled Tylenol .   Essential hypertension: Continue Coreg , Entresto  and amlodipine . Blood pressure stable.   History of CVA: Continue aspirin  and Plavix . Will need skilled nursing facility placement.   Hypothyroidism:  Continue Synthroid .   Acute kidney injury on chronic kidney disease stage IIIa: Baseline creatinine 1.2 creatinine peaked at 2.29.  Now back to normal.   Chronic pain syndrome: Continue Lyrica .   Normocytic anemia/acute blood loss anemia: Likely due to acute blood loss from amputation. Hemoglobin ranging around 8-9.   Chronic HFpEF: Continue Coreg  Entresto  and Lasix .   Insomnia: Continue trazodone .   Toxic metabolic encephalopathy: Likely due to medications. Baclofen  was held his mentation improved. CT showed no acute findings.  Try to minimize narcotics as much as possible.   Constipation: Continue bowel regimen  Stage I sacral decubitus ulcer present on admission: Continue foam dressing.  Pain control - [place]  Controlled Substance Reporting System database was reviewed. and patient was instructed, not to drive, operate heavy machinery, perform activities at heights, swimming or participation in water  activities or provide baby-sitting services while on Pain, Sleep and Anxiety Medications; until their outpatient Physician has advised to do so again. Also recommended to not to take more than prescribed Pain, Sleep and Anxiety Medications.  Consultants:  Orthopedics  CIR  Procedures performed:  7/09  AMPUTATION BELOW KNEE,  LEFT, APPLICATION, WOUND VAC   DISCHARGE MEDICATION: Allergies as of 10/12/2023       Reactions   Ace Inhibitors Cough   Reaction not listed on MAR        Medication List     STOP taking these medications    baclofen  10 MG tablet Commonly known as:  LIORESAL    Farxiga 5 MG Tabs tablet Generic drug: dapagliflozin propanediol   ferrous sulfate  325 (65 FE) MG EC tablet   Glucagon Emergency 1 MG Kit   loperamide  2 MG tablet Commonly known as: IMODIUM  A-D       TAKE these medications    acetaminophen  500 MG tablet Commonly known as: TYLENOL  Take 2 tablets (1,000 mg total) by mouth in the morning and at bedtime for 7 days. What changed: how much to take   albuterol  108 (90 Base) MCG/ACT inhaler Commonly known as: VENTOLIN  HFA Inhale 2 puffs into the lungs every 6 (six) hours as needed for wheezing.   amLODipine  10 MG tablet Commonly known as: NORVASC  Take 1 tablet (10 mg total) by mouth every morning.   aspirin  EC 81 MG tablet Take 1 tablet (81 mg total) by mouth daily. Swallow whole. What changed: when to take this   atorvastatin  40 MG tablet Commonly known as: LIPITOR Take 1 tablet (40 mg total) by mouth every evening. What changed: when to take this   benzonatate  100 MG capsule Commonly known as: TESSALON  Take 1 capsule (100 mg total) by mouth every 8 (eight) hours as needed for cough.   Calamine-Zinc  Oxide 8-8 % Lotn Apply 1 application  topically in the morning and at bedtime. Apply to scrotum topically every night and day shift for redness.   carboxymethylcellulose 0.5 % Soln Commonly known as: REFRESH PLUS Place 2 drops into both eyes every 4 (four) hours as needed (dry eyes).   carvedilol  6.25 MG tablet Commonly known as: COREG  Take 1 tablet (6.25 mg total) by mouth 2 (two) times daily with a meal.   cetirizine 10 MG tablet Commonly known as: ZYRTEC Take 10 mg by mouth at bedtime.   clopidogrel  75 MG tablet Commonly known as: PLAVIX  Take 1 tablet (75 mg total) by mouth daily. What changed: when to take this   cycloSPORINE 0.05 % ophthalmic emulsion Commonly known as: RESTASIS Place 1 drop into both eyes 2 (two) times daily.   dextromethorphan-guaiFENesin  10-100 MG/5ML liquid Commonly known  as: ROBITUSSIN-DM Take 10 mLs by mouth every 6 (six) hours as needed for cough.   DULoxetine  60 MG capsule Commonly known as: CYMBALTA  Take 60 mg by mouth in the morning.   furosemide  20 MG tablet Commonly known as: Lasix  Take 1 tablet (20 mg total) by mouth every other day. Monitor volume status and change dose accordingly.   Glucerna Liqd Take 237 mLs by mouth 2 (two) times daily between meals.   HumaLOG  KwikPen 100 UNIT/ML KwikPen Generic drug: insulin  lispro Inject 0-9 Units into the skin See admin instructions. Inject as per sliding scale: if 70-120 = 0, 121-150= 1 unit, 151-200 = 3 units, 201-300 = 5 units, 301-350 = 7 units, 351-400 = 9 units. Check 3 times daily. What changed: Another medication with the same name was removed. Continue taking this medication, and follow the directions you see here.   HYDROcodone -acetaminophen  5-325 MG tablet Commonly known as: NORCO/VICODIN Take 1 tablet by mouth every 6 (six) hours as needed for up to 20 days for moderate pain (pain score 4-6).   levothyroxine   75 MCG tablet Commonly known as: SYNTHROID  Take 75 mcg by mouth at bedtime. 2200   melatonin 5 MG Tabs Take 5 mg by mouth at bedtime.   multivitamin with minerals Tabs tablet Take 1 tablet by mouth daily. What changed: when to take this   ondansetron  4 MG tablet Commonly known as: ZOFRAN  Take 4 mg by mouth every 8 (eight) hours as needed for nausea or vomiting.   polyethylene glycol 17 g packet Commonly known as: MIRALAX  / GLYCOLAX  Take 17 g by mouth daily as needed for mild constipation.   pregabalin  75 MG capsule Commonly known as: LYRICA  Take 1 capsule (75 mg total) by mouth 2 (two) times daily.   sacubitril -valsartan  24-26 MG Commonly known as: ENTRESTO  Take 1 tablet by mouth 2 (two) times daily.   senna-docusate 8.6-50 MG tablet Commonly known as: Senokot-S Take 1 tablet by mouth 2 (two) times daily. Hold if having loose or frequent stools. What changed: how  much to take   simethicone  80 MG chewable tablet Commonly known as: MYLICON Chew 1 tablet (80 mg total) by mouth 4 (four) times daily for 5 days.   traZODone  50 MG tablet Commonly known as: DESYREL  Take 1 tablet (50 mg total) by mouth at bedtime as needed for sleep.   UNABLE TO FIND 30 mLs by Other route in the morning and at bedtime. Liquid Protein 30cc               Discharge Care Instructions  (From admission, onward)           Start     Ordered   10/12/23 0000  Discharge wound care:       Comments: Daily dry dressing of the leg wound.   10/12/23 1011           Disposition: SNF Diet recommendation: Carb modified diet  Discharge Exam: Vitals:   10/11/23 1521 10/11/23 1943 10/12/23 0353 10/12/23 0902  BP: (!) 147/77 (!) 142/74 (!) 143/62 (!) 141/69  Pulse: 65 65 62 62  Resp: 16 17 16 16   Temp: 98.2 F (36.8 C) (!) 97.5 F (36.4 C) 98.1 F (36.7 C) 98 F (36.7 C)  TempSrc: Oral Oral Oral Oral  SpO2: 99% 96% 100% 98%  Weight:      Height:       Clear to auscultation  Left left BKA, no edema or warmth.  Bowel sounds present Non tender  Filed Weights   09/25/23 2302  Weight: 101.4 kg   Condition at discharge: stable  The results of significant diagnostics from this hospitalization (including imaging, microbiology, ancillary and laboratory) are listed below for reference.   Imaging Studies: MR FOOT LEFT W WO CONTRAST Addendum Date: 10/04/2023 ADDENDUM REPORT: 10/04/2023 12:02 ADDENDUM: The original report was by Dr. Ryan Salvage. The following addendum is by Dr. Ryan Salvage: Examination and technique should be as follows: EXAM: MRI OF THE LEFT FOOT WITHOUT AND WITH CONTRAST TECHNIQUE: Multiplanar, multisequence MR imaging of the left foot from the posterior calcaneus to the midfoot was performed both before and after administration of intravenous contrast. Electronically Signed   By: Ryan Salvage M.D.   On: 10/04/2023 12:02   Result  Date: 10/04/2023 CLINICAL DATA:  Left heel pain/wound, diabetes EXAM: MRI OF THE LEFT HINDFOOT WITHOUT AND WITH CONTRAST TECHNIQUE: Multiplanar, multisequence MR imaging of the left hindfoot was performed both before and after administration of intravenous contrast. CONTRAST:  10mL GADAVIST  GADOBUTROL  1 MMOL/ML IV SOLN COMPARISON:  Radiographs 09/25/2023 FINDINGS:  Bones/Joint/Cartilage Cortical erosion and marrow edema posteriorly along the calcaneus compatible with posterior calcaneal osteomyelitis. Associated marrow enhancement noted. Ligaments Thickened anterior talofibular ligament, query remote injury. Thickened superomedial portion of the spring ligament. Muscles and Tendons Mild distal tibialis posterior tendinopathy. Moderate distal Achilles tendinopathy. Thickened proximal plantar fascia, especially the medial band, favoring plantar fasciitis. Regional muscular atrophy. Soft tissues Ulceration posterior to the calcaneus in the vicinity of the Achilles attachment site. Diffuse subcutaneous edema along the ankle especially notable medially and laterally, and tracking into the dorsum of the foot. IMPRESSION: 1. Posterior calcaneal osteomyelitis. 2. Ulceration posterior to the calcaneus in the vicinity of the Achilles attachment site. 3. Moderate distal Achilles tendinopathy. 4. Thickened proximal plantar fascia, especially the medial band, favoring plantar fasciitis. 5. Mild distal tibialis posterior tendinopathy. 6. Thickened anterior talofibular ligament, query remote injury. 7. Diffuse subcutaneous edema along the ankle especially notable medially and laterally, and tracking into the dorsum of the foot. Electronically Signed: By: Ryan Salvage M.D. On: 09/26/2023 08:06   CT HEAD WO CONTRAST ( ) Result Date: 09/30/2023 CLINICAL DATA:  Initial evaluation for acute delirium. EXAM: CT HEAD WITHOUT CONTRAST TECHNIQUE: Contiguous axial images were obtained from the base of the skull through the vertex  without intravenous contrast. RADIATION DOSE REDUCTION: This exam was performed according to the departmental dose-optimization program which includes automated exposure control, adjustment of the mA and/or kV according to patient size and/or use of iterative reconstruction technique. COMPARISON:  MRI from 03/26/2023 FINDINGS: Brain: Examination mildly degraded by motion. Generalized age-related cerebral atrophy with moderate chronic microvascular ischemic disease. Chronic infarcts involving the right basal ganglia/right thalamus noted. Few small remote cerebellar infarcts. No acute intracranial hemorrhage. No acute large vessel territory infarct. Apparent vague hypodensity overlying the left occipital region felt to be consistent with artifact. No mass lesion, midline shift or mass effect. No hydrocephalus or extra-axial fluid collection. Vascular: No abnormal hyperdense vessel. Calcified atherosclerosis present at the skull base. Skull: Scalp soft tissues demonstrate no acute finding. Calvarium intact. Sinuses/Orbits: Globes orbital soft tissues within normal limits. Chronic left maxillary sinusitis noted. Additional mild mucosal thickening noted about the ethmoidal air cells and right maxillary sinus. Trace right mastoid effusion noted, of doubtful significance. Other: None. IMPRESSION: 1. No acute intracranial abnormality. 2. Generalized age-related cerebral atrophy with moderate chronic microvascular ischemic disease, with chronic infarcts about the right basal ganglia/right thalamus and cerebellum. 3. Chronic left maxillary sinusitis. Electronically Signed   By: Morene Hoard M.D.   On: 09/30/2023 01:03   DG Abd 1 View Result Date: 09/29/2023 CLINICAL DATA:  Abdominal distension EXAM: ABDOMEN - 1 VIEW COMPARISON:  CT scan 07/25/2022 FINDINGS: Mild prominence of stool in the colon raising the possibility of constipation although the appearance is less striking in the left colon. Nondilated small bowel  loops noted including some clustered loops in the left abdomen, nonspecific and not necessarily abnormal. No centralization of bowel loops or other specific radiographic indicators of ascites. Mild degenerative arthropathy of both hips. IMPRESSION: 1. Mild prominence of stool in the colon raising the possibility of constipation. 2. Mild degenerative arthropathy of both hips. Electronically Signed   By: Ryan Salvage M.D.   On: 09/29/2023 14:27   VAS US  ABI WITH/WO TBI Result Date: 09/27/2023  LOWER EXTREMITY DOPPLER STUDY Patient Name:  BURK HOCTOR  Date of Exam:   09/26/2023 Medical Rec #: [id number]          Accession #:    [id number] Date of Birth: 20-Oct-1955  Patient Gender: M Patient Age:   68 years Exam Location:  [facility] Procedure:      VAS US  ABI WITH/WO TBI Referring Phys: MARCUS DUDA --------------------------------------------------------------------------------  Indications: Peripheral artery disease. High Risk Factors: Diabetes, coronary artery disease, prior CVA.  Comparison Study: No prior Performing Technologist: Jimmye Scarce RVT  Examination Guidelines: A complete evaluation includes at minimum, Doppler waveform signals and systolic blood pressure reading at the level of bilateral brachial, anterior tibial, and posterior tibial arteries, when vessel segments are accessible. Bilateral testing is considered an integral part of a complete examination. Photoelectric Plethysmograph (PPG) waveforms and toe systolic pressure readings are included as required and additional duplex testing as needed. Limited examinations for reoccurring indications may be performed as noted.  ABI Findings: +---------+------------------+-----+---------+--------+ Right    Rt Pressure (mmHg)IndexWaveform Comment  +---------+------------------+-----+---------+--------+ Brachial 140                    triphasic         +---------+------------------+-----+---------+--------+ PTA                              biphasic          +---------+------------------+-----+---------+--------+ DP                              triphasic         +---------+------------------+-----+---------+--------+ Great Toe140               1.00 Normal            +---------+------------------+-----+---------+--------+ +---------+------------------+-----+-----------+---------+ Left     Lt Pressure (mmHg)IndexWaveform   Comment   +---------+------------------+-----+-----------+---------+ Brachial                                   paralyzed +---------+------------------+-----+-----------+---------+ PTA                             multiphasic          +---------+------------------+-----+-----------+---------+ DP                              triphasic            +---------+------------------+-----+-----------+---------+ Great Toe72                0.51 Abnormal             +---------+------------------+-----+-----------+---------+ +-------+-----------+-----------+------------+------------+ ABI/TBIToday's ABIToday's TBIPrevious ABIPrevious TBI +-------+-----------+-----------+------------+------------+ Right  N/C        1.0                                 +-------+-----------+-----------+------------+------------+ Left   N/C        0.51                                +-------+-----------+-----------+------------+------------+  Summary: Right: Resting right ankle-brachial index indicates noncompressible right lower extremity arteries. The right toe-brachial index is normal. Left: Resting left ankle-brachial index indicates noncompressible left lower extremity arteries. The left toe-brachial index is abnormal. *See table(s) above for measurements and observations.  Electronically signed by Debby Robertson on 09/27/2023 at 5:04:22  PM.    Final    DG Foot Complete Left Result Date: 09/25/2023 CLINICAL DATA:  L foot wound EXAM: LEFT FOOT - COMPLETE 3+ VIEW COMPARISON:  None  Available. FINDINGS: Diffuse osteopenia.No acute fracture or dislocation. There is no evidence of arthropathy or other focal bone abnormality. Severe diffuse peripheral vascular atherosclerosis. Subcutaneous gas overlying the posterior calcaneal margin, likely due to underlying patient's wound. Cortical irregularity is also present in the underlying calcaneus, worrisome for osteomyelitis. IMPRESSION: Subcutaneous gas overlying the posterior calcaneal margin, likely representing patient's known wound. Cortical irregularity is also present in the underlying calcaneus, worrisome for osteomyelitis. A follow-up MRI of the foot is recommended for confirmation Electronically Signed   By: Rogelia Myers M.D.   On: 09/25/2023 14:30    Microbiology: Results for orders placed or performed during the hospital encounter of 09/25/23  Blood Cultures x 2 sites     Status: None   Collection Time: 09/25/23  3:15 PM   Specimen: BLOOD  Result Value Ref Range Status   Specimen Description   Final    BLOOD SITE NOT SPECIFIED Performed at [facility], [address], [place], [place] [address]    Special Requests   Final    BOTTLES DRAWN AEROBIC AND ANAEROBIC Blood Culture adequate volume Performed at [facility], [address] [place], [place] [address]    Culture   Final    NO GROWTH 5 DAYS Performed at [facility], [address] [place], [place] [address]    Report Status 09/30/2023 FINAL  Final  Blood Cultures x 2 sites     Status: None   Collection Time: 09/25/23 11:07 PM   Specimen: BLOOD  Result Value Ref Range Status   Specimen Description BLOOD SITE NOT SPECIFIED  Final   Special Requests   Final    BOTTLES DRAWN AEROBIC AND ANAEROBIC Blood Culture results may not be optimal due to an inadequate volume of blood received in culture bottles   Culture   Final    NO GROWTH 5 DAYS Performed at [facility], [address] [place], [place]  [address]    Report Status 09/30/2023 FINAL  Final  MRSA Next Gen by PCR, Nasal     Status: None   Collection Time: 09/26/23  8:02 AM   Specimen: Nasal Mucosa; Nasal Swab  Result Value Ref Range Status   MRSA by PCR Next Gen NOT DETECTED NOT DETECTED Final    Comment: (NOTE) The GeneXpert MRSA Assay (FDA approved for NASAL specimens only), is one component of a comprehensive MRSA colonization surveillance program. It is not intended to diagnose MRSA infection nor to guide or monitor treatment for MRSA infections. Test performance is not FDA approved in patients less than 75 years old. Performed at [facility], [address] [place], [place] [address]    Labs: CBC: Recent Labs  Lab 10/06/23 0506 10/11/23 1016  WBC 9.1 7.0  HGB 8.5* 9.2*  HCT 26.1* 27.8*  MCV 87.3 86.1  PLT 376 427*   Basic Metabolic Panel: Recent Labs  Lab 10/06/23 0506 10/11/23 1016  NA 138 136  K 3.9 3.9  CL 106 102  CO2 21* 24  GLUCOSE 145* 156*  BUN 16 13  CREATININE 1.07 1.08  CALCIUM  8.8* 8.9   Liver Function Tests: No results for input(s): AST, ALT, ALKPHOS, BILITOT, PROT, ALBUMIN in the last 168 hours. CBG: Recent Labs  Lab 10/11/23 1155 10/11/23  1645 10/11/23 2141 10/12/23 0603 10/12/23 1114  GLUCAP 217* 125* 165* 164* 133*    Discharge time spent: greater than 30 minutes.  Author: Yetta Blanch, MD  [facility]

## 2023-10-13 ENCOUNTER — Telehealth: Payer: Self-pay | Admitting: Orthopedic Surgery

## 2023-10-13 NOTE — Telephone Encounter (Signed)
 Verneita From the [facility] needs a post op appointment. CB# [phone]

## 2023-10-16 NOTE — Telephone Encounter (Signed)
 Called Verneita back and left her a detailed message on her VM that I have scheduled pt to come in on 10/19/23 at 2:15 for first post op appointment. If there is an issue with this time or date to call us  back to reschedule.

## 2023-10-19 ENCOUNTER — Encounter: Admitting: Orthopedic Surgery

## 2023-10-23 ENCOUNTER — Ambulatory Visit: Admitting: Physician Assistant

## 2023-10-24 ENCOUNTER — Encounter: Payer: Self-pay | Admitting: Family

## 2023-10-24 ENCOUNTER — Ambulatory Visit (INDEPENDENT_AMBULATORY_CARE_PROVIDER_SITE_OTHER): Admitting: Family

## 2023-10-24 DIAGNOSIS — Z89512 Acquired absence of left leg below knee: Secondary | ICD-10-CM

## 2023-10-24 DIAGNOSIS — S88112D Complete traumatic amputation at level between knee and ankle, left lower leg, subsequent encounter: Secondary | ICD-10-CM

## 2023-10-24 NOTE — Progress Notes (Signed)
 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  He has been having Xeroform dressings as well as a foam dressing on top of this his leg to his incision Ortho Exam On examination left residual limb medially this is well-healed staples are in place laterally this has gaped about 2 mm there is fibrinous exudative tissue there is no frank dehiscence no active drainage or erythema  Visit Diagnoses: No diagnosis found.  Plan: Please hold off on Xeroform dressings applied dry dressings daily continue shrinker he will follow-up in 2 weeks for remaining staples to be harvested  Follow-Up Instructions: No follow-ups on file.   Imaging: No results found.  Orders:  No orders of the defined types were placed in this encounter.  No orders of the defined types were placed in this encounter.    PMFS History: Patient Active Problem List   Diagnosis Date Noted   Pressure injury of left heel, stage 4 (HCC) 09/27/2023   Osteomyelitis of left foot (HCC) 09/26/2023   Gangrene of left foot (HCC) 09/26/2023   Acute osteomyelitis of calcaneum, unspecified laterality (HCC) 09/25/2023   Sepsis secondary to UTI (HCC) 06/05/2023   Acute metabolic encephalopathy 03/26/2023   Hyperkalemia 03/26/2023   Anemia of chronic disease 03/26/2023   History of CAD (coronary artery disease) 03/26/2023   Insulin  dependent type 2 diabetes mellitus (HCC) 03/26/2023   History of depression 03/26/2023   History of CVA (cerebrovascular accident) 03/26/2023   Malnutrition of moderate degree 09/01/2022   UTI (urinary tract infection) 08/29/2022   Chronic anemia 08/28/2022   Unable  to care for self 08/28/2022   Intractable diarrhea 07/26/2022   E. coli UTI 07/26/2022   Uncontrolled type 2 diabetes mellitus with hyperglycemia, with long-term current use of insulin  (HCC) 07/26/2022   Chronic diastolic CHF (congestive heart failure) (HCC) 07/26/2022   Coronary artery disease 07/26/2022   GERD with esophagitis 07/26/2022   Demand ischemia (HCC)    Urinary tract infection, recurrent 11/08/2021   NSTEMI, Type ll (non-ST elevated myocardial infarction) (HCC) 11/07/2021   Acute on chronic heart failure with preserved ejection fraction (HFpEF) (HCC) 11/07/2021   Hematuria 11/07/2021   Acute on chronic blood loss anemia 11/07/2021   Hemiparesis affecting left side as late effect of cerebrovascular accident (CVA) (HCC) 11/07/2021   Acute respiratory failure with hypoxia (HCC) 11/07/2021   CKD stage 3a, GFR 45-59 ml/min (HCC) 08/21/2021   GERD without esophagitis 08/20/2021   Basal ganglia stroke (HCC) 08/12/2021   Type 2 diabetes mellitus with diabetic polyneuropathy, with long-term current use of insulin  (HCC) 07/09/2021   Hypothyroidism 07/09/2021   Depression 10/21/2020   Non compliance w medication regimen 10/21/2020   Cocaine abuse (HCC) 09/05/2020   Adjustment disorder with emotional disturbance 09/05/2020   Hypertensive urgency 03/08/2015   Essential hypertension 02/14/2011   Coronary artery disease involving native coronary artery of native heart without angina pectoris 08/13/2010   Stented coronary artery 08/13/2010   Past Medical History:  Diagnosis Date   Acute kidney injury (HCC) 08/20/2021  Basal ganglia stroke (HCC)    a. 07/2021 L sided wkns/facial droop/fall-->MRI brain: nonhemorrhagic infarct of posterior R lentiform nucleus and corona radiata measure. Remote lacunar infarcts of post cerebellum bilat and L thalamus.   CHF (congestive heart failure) (HCC)    CKD (chronic kidney disease), stage II    a. 08/2021 AKI w/ creat up to 3.4.   Cocaine use     Coronary artery disease    a. 07/2010 PCI: LAD 14m, LCX 80 diff/small, OM1 48m, RCA 107m (3.0x18 Vision BMS). EF 50%; b. 07/2013 Cath: LM nl, LAD 30p, 20d, LCX 20d, LCX 100, OM1 60, RCA 30p, 22m, RPDA 90-->Med Rx; c. 10/2020 MV: EF 46% (60-65% by echo), no ischemia. 3V cor Ca2+.   Depression    Diabetes mellitus    Type II   Diastolic dysfunction    a. 10/2020 Echo: EF 60-65%, no rwma, mild LVH, GrI DD, nl RV size/fxn; b. 07/2021 Echo: EF 60-65%, no rwma, GrI DD, nl RV fxn, mildly dil LA, triv MR; c. 10/2021 Echo: EF 55-60%, no rwma, GrI DD, nl RV fxn, mild-mod MR, Asc Ao 37mm.   Hyperlipidemia    Hypertension    Hypertensive urgency 03/08/2015   Hypothyroidism     Family History  Problem Relation Age of Onset   Heart attack Mother     Past Surgical History:  Procedure Laterality Date   AMPUTATION Left 09/27/2023   Procedure: AMPUTATION BELOW KNEE, LEFT;  Surgeon: Harden Jerona GAILS, MD;  Location: [facility] OR;  Service: Orthopedics;  Laterality: Left;   APPLICATION OF WOUND VAC Left 09/27/2023   Procedure: APPLICATION, WOUND VAC;  Surgeon: Harden Jerona GAILS, MD;  Location: [facility] OR;  Service: Orthopedics;  Laterality: Left;   CARDIAC CATHETERIZATION  08/06/2010   Bare metal stent placed in RCA.   HAND SURGERY     left   Social History   Occupational History   Not on file  Tobacco Use   Smoking status: Never   Smokeless tobacco: Never  Vaping Use   Vaping status: Never Used  Substance and Sexual Activity   Alcohol use: No   Drug use: Yes    Types: Cocaine    Comment: crack   Sexual activity: Not on file

## 2023-11-06 ENCOUNTER — Ambulatory Visit (HOSPITAL_BASED_OUTPATIENT_CLINIC_OR_DEPARTMENT_OTHER): Admitting: Internal Medicine

## 2023-11-07 ENCOUNTER — Encounter: Admitting: Family

## 2023-11-15 ENCOUNTER — Encounter: Admitting: Family

## 2023-11-22 ENCOUNTER — Ambulatory Visit (INDEPENDENT_AMBULATORY_CARE_PROVIDER_SITE_OTHER): Admitting: Family

## 2023-11-22 ENCOUNTER — Encounter: Payer: Self-pay | Admitting: Family

## 2023-11-22 DIAGNOSIS — I96 Gangrene, not elsewhere classified: Secondary | ICD-10-CM

## 2023-11-22 NOTE — Progress Notes (Signed)
 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  He is residing at skilled nursing. Ortho Exam On examination left residual limb the lateral sutures are in place there is no gaping or drainage this is healing and consolidating well.  Visit Diagnoses: No diagnosis found.  Plan: Remaining staples harvested.  He will continue daily Dial soap cleansing shrinker around-the-clock  Follow-Up Instructions: No follow-ups on file.   Imaging: No results found.  Orders:  No orders of the defined types were placed in this encounter.  No orders of the defined types were placed in this encounter.    PMFS History: Patient Active Problem List   Diagnosis Date Noted   Pressure injury of left heel, stage 4 (HCC) 09/27/2023   Osteomyelitis of left foot (HCC) 09/26/2023   Gangrene of left foot (HCC) 09/26/2023   Acute osteomyelitis of calcaneum, unspecified laterality (HCC) 09/25/2023   Sepsis secondary to UTI (HCC) 06/05/2023   Acute metabolic encephalopathy 03/26/2023   Hyperkalemia 03/26/2023   Anemia of chronic disease 03/26/2023   History of CAD (coronary artery disease) 03/26/2023   Insulin  dependent type 2 diabetes mellitus (HCC) 03/26/2023   History of depression 03/26/2023   History of CVA (cerebrovascular accident) 03/26/2023   Malnutrition of moderate degree 09/01/2022   UTI (urinary tract infection) 08/29/2022   Chronic anemia 08/28/2022   Unable to care for self 08/28/2022   Intractable diarrhea 07/26/2022   E. coli UTI 07/26/2022   Uncontrolled type 2 diabetes mellitus with hyperglycemia, with long-term current use of insulin  (HCC)  07/26/2022   Chronic diastolic CHF (congestive heart failure) (HCC) 07/26/2022   Coronary artery disease 07/26/2022   GERD with esophagitis 07/26/2022   Demand ischemia (HCC)    Urinary tract infection, recurrent 11/08/2021   NSTEMI, Type ll (non-ST elevated myocardial infarction) (HCC) 11/07/2021   Acute on chronic heart failure with preserved ejection fraction (HFpEF) (HCC) 11/07/2021   Hematuria 11/07/2021   Acute on chronic blood loss anemia 11/07/2021   Hemiparesis affecting left side as late effect of cerebrovascular accident (CVA) (HCC) 11/07/2021   Acute respiratory failure with hypoxia (HCC) 11/07/2021   CKD stage 3a, GFR 45-59 ml/min (HCC) 08/21/2021   GERD without esophagitis 08/20/2021   Basal ganglia stroke (HCC) 08/12/2021   Type 2 diabetes mellitus with diabetic polyneuropathy, with long-term current use of insulin  (HCC) 07/09/2021   Hypothyroidism 07/09/2021   Depression 10/21/2020   Non compliance w medication regimen 10/21/2020   Cocaine abuse (HCC) 09/05/2020   Adjustment disorder with emotional disturbance 09/05/2020   Hypertensive urgency 03/08/2015   Essential hypertension 02/14/2011   Coronary artery disease involving native coronary artery of native heart without angina pectoris 08/13/2010   Stented coronary artery 08/13/2010   Past Medical History:  Diagnosis Date   Acute kidney injury (HCC) 08/20/2021   Basal ganglia stroke (HCC)    a. 07/2021 L sided wkns/facial droop/fall-->MRI brain: nonhemorrhagic infarct of posterior R lentiform nucleus and corona radiata measure. Remote lacunar infarcts of post cerebellum bilat and L thalamus.  CHF (congestive heart failure) (HCC)    CKD (chronic kidney disease), stage II    a. 08/2021 AKI w/ creat up to 3.4.   Cocaine use    Coronary artery disease    a. 07/2010 PCI: LAD 14m, LCX 80 diff/small, OM1 45m, RCA 78m (3.0x18 Vision BMS). EF 50%; b. 07/2013 Cath: LM nl, LAD 30p, 20d, LCX 20d, LCX 100, OM1 60, RCA 30p, 14m,  RPDA 90-->Med Rx; c. 10/2020 MV: EF 46% (60-65% by echo), no ischemia. 3V cor Ca2+.   Depression    Diabetes mellitus    Type II   Diastolic dysfunction    a. 10/2020 Echo: EF 60-65%, no rwma, mild LVH, GrI DD, nl RV size/fxn; b. 07/2021 Echo: EF 60-65%, no rwma, GrI DD, nl RV fxn, mildly dil LA, triv MR; c. 10/2021 Echo: EF 55-60%, no rwma, GrI DD, nl RV fxn, mild-mod MR, Asc Ao 37mm.   Hyperlipidemia    Hypertension    Hypertensive urgency 03/08/2015   Hypothyroidism     Family History  Problem Relation Age of Onset   Heart attack Mother     Past Surgical History:  Procedure Laterality Date   AMPUTATION Left 09/27/2023   Procedure: AMPUTATION BELOW KNEE, LEFT;  Surgeon: Harden Jerona GAILS, MD;  Location: [facility] OR;  Service: Orthopedics;  Laterality: Left;   APPLICATION OF WOUND VAC Left 09/27/2023   Procedure: APPLICATION, WOUND VAC;  Surgeon: Harden Jerona GAILS, MD;  Location: [facility] OR;  Service: Orthopedics;  Laterality: Left;   CARDIAC CATHETERIZATION  08/06/2010   Bare metal stent placed in RCA.   HAND SURGERY     left   Social History   Occupational History   Not on file  Tobacco Use   Smoking status: Never   Smokeless tobacco: Never  Vaping Use   Vaping status: Never Used  Substance and Sexual Activity   Alcohol use: No   Drug use: Yes    Types: Cocaine    Comment: crack   Sexual activity: Not on file

## 2023-12-20 ENCOUNTER — Encounter: Admitting: Family

## 2024-01-22 ENCOUNTER — Encounter: Payer: Self-pay | Admitting: Radiology
